# Patient Record
Sex: Female | Born: 1940
Health system: Southern US, Community
[De-identification: ages and names within clinical notes are randomized; demographics above are authoritative.]

## PROBLEM LIST (undated history)

## (undated) DIAGNOSIS — E785 Hyperlipidemia, unspecified: Secondary | ICD-10-CM

## (undated) DIAGNOSIS — C801 Malignant (primary) neoplasm, unspecified: Secondary | ICD-10-CM

## (undated) DIAGNOSIS — E039 Hypothyroidism, unspecified: Secondary | ICD-10-CM

## (undated) DIAGNOSIS — G20A1 Parkinson's disease without dyskinesia, without mention of fluctuations: Secondary | ICD-10-CM

## (undated) DIAGNOSIS — Z950 Presence of cardiac pacemaker: Secondary | ICD-10-CM

## (undated) DIAGNOSIS — R51 Headache: Secondary | ICD-10-CM

## (undated) DIAGNOSIS — E119 Type 2 diabetes mellitus without complications: Secondary | ICD-10-CM

## (undated) DIAGNOSIS — M4800 Spinal stenosis, site unspecified: Secondary | ICD-10-CM

## (undated) DIAGNOSIS — I219 Acute myocardial infarction, unspecified: Secondary | ICD-10-CM

## (undated) DIAGNOSIS — M5090 Cervical disc disorder, unspecified, unspecified cervical region: Secondary | ICD-10-CM

## (undated) DIAGNOSIS — F329 Major depressive disorder, single episode, unspecified: Secondary | ICD-10-CM

## (undated) DIAGNOSIS — G20 Parkinson's disease: Secondary | ICD-10-CM

## (undated) DIAGNOSIS — K219 Gastro-esophageal reflux disease without esophagitis: Secondary | ICD-10-CM

## (undated) DIAGNOSIS — M542 Cervicalgia: Secondary | ICD-10-CM

## (undated) DIAGNOSIS — I2510 Atherosclerotic heart disease of native coronary artery without angina pectoris: Secondary | ICD-10-CM

## (undated) DIAGNOSIS — F419 Anxiety disorder, unspecified: Secondary | ICD-10-CM

## (undated) DIAGNOSIS — M1990 Unspecified osteoarthritis, unspecified site: Secondary | ICD-10-CM

## (undated) DIAGNOSIS — F32A Depression, unspecified: Secondary | ICD-10-CM

## (undated) DIAGNOSIS — R519 Headache, unspecified: Secondary | ICD-10-CM

## (undated) DIAGNOSIS — I10 Essential (primary) hypertension: Secondary | ICD-10-CM

## (undated) DIAGNOSIS — S42309A Unspecified fracture of shaft of humerus, unspecified arm, initial encounter for closed fracture: Secondary | ICD-10-CM

## (undated) DIAGNOSIS — I509 Heart failure, unspecified: Secondary | ICD-10-CM

## (undated) HISTORY — DX: Anxiety disorder, unspecified: F41.9

## (undated) HISTORY — PX: OTHER SURGICAL HISTORY: SHX169

## (undated) HISTORY — PX: CORONARY ANGIOPLASTY: SHX604

## (undated) HISTORY — PX: CORONARY ARTERY BYPASS GRAFT: SHX141

## (undated) HISTORY — DX: Depression, unspecified: F32.A

## (undated) HISTORY — DX: Hypothyroidism, unspecified: E03.9

## (undated) HISTORY — PX: COLONOSCOPY: SHX174

## (undated) HISTORY — PX: INSERT / REPLACE / REMOVE PACEMAKER: SUR710

## (undated) HISTORY — PX: CORONARY STENT PLACEMENT: SHX1402

## (undated) HISTORY — DX: Essential (primary) hypertension: I10

## (undated) HISTORY — DX: Acute myocardial infarction, unspecified: I21.9

## (undated) HISTORY — DX: Major depressive disorder, single episode, unspecified: F32.9

## (undated) HISTORY — PX: ABDOMINAL HYSTERECTOMY: SHX81

## (undated) HISTORY — PX: BREAST SURGERY: SHX581

## (undated) HISTORY — DX: Cervicalgia: M54.2

## (undated) HISTORY — DX: Unspecified fracture of shaft of humerus, unspecified arm, initial encounter for closed fracture: S42.309A

## (undated) HISTORY — DX: Spinal stenosis, site unspecified: M48.00

## (undated) HISTORY — DX: Hyperlipidemia, unspecified: E78.5

---

## 2005-07-21 ENCOUNTER — Other Ambulatory Visit: Payer: Self-pay

## 2005-07-21 ENCOUNTER — Emergency Department: Payer: Self-pay | Admitting: Emergency Medicine

## 2006-06-06 ENCOUNTER — Emergency Department: Payer: Self-pay | Admitting: Emergency Medicine

## 2006-06-06 ENCOUNTER — Other Ambulatory Visit: Payer: Self-pay

## 2006-06-07 ENCOUNTER — Emergency Department: Payer: Self-pay | Admitting: Internal Medicine

## 2006-08-08 ENCOUNTER — Ambulatory Visit: Payer: Self-pay | Admitting: Ophthalmology

## 2006-08-15 ENCOUNTER — Ambulatory Visit: Payer: Self-pay | Admitting: Ophthalmology

## 2006-09-06 ENCOUNTER — Ambulatory Visit: Payer: Self-pay | Admitting: Ophthalmology

## 2006-09-13 ENCOUNTER — Ambulatory Visit: Payer: Self-pay | Admitting: Ophthalmology

## 2006-10-23 ENCOUNTER — Inpatient Hospital Stay: Payer: Self-pay | Admitting: Internal Medicine

## 2006-10-28 ENCOUNTER — Other Ambulatory Visit: Payer: Self-pay

## 2006-10-28 ENCOUNTER — Emergency Department: Payer: Self-pay | Admitting: Emergency Medicine

## 2006-11-22 ENCOUNTER — Ambulatory Visit: Payer: Self-pay | Admitting: Gastroenterology

## 2007-01-11 ENCOUNTER — Emergency Department: Payer: Self-pay | Admitting: Emergency Medicine

## 2007-03-08 ENCOUNTER — Ambulatory Visit: Payer: Self-pay | Admitting: Internal Medicine

## 2007-05-03 ENCOUNTER — Inpatient Hospital Stay: Payer: Self-pay | Admitting: Internal Medicine

## 2007-05-03 ENCOUNTER — Other Ambulatory Visit: Payer: Self-pay

## 2008-05-08 ENCOUNTER — Inpatient Hospital Stay: Payer: Self-pay | Admitting: Internal Medicine

## 2008-06-27 ENCOUNTER — Emergency Department: Payer: Self-pay | Admitting: Unknown Physician Specialty

## 2008-07-23 ENCOUNTER — Ambulatory Visit: Payer: Self-pay

## 2008-12-26 ENCOUNTER — Emergency Department: Payer: Self-pay | Admitting: Emergency Medicine

## 2009-03-30 ENCOUNTER — Inpatient Hospital Stay: Payer: Self-pay | Admitting: Unknown Physician Specialty

## 2009-04-27 ENCOUNTER — Ambulatory Visit: Payer: Self-pay | Admitting: Gastroenterology

## 2009-11-17 ENCOUNTER — Ambulatory Visit: Payer: Self-pay | Admitting: Pain Medicine

## 2010-01-11 ENCOUNTER — Ambulatory Visit: Payer: Self-pay | Admitting: Pain Medicine

## 2010-01-20 ENCOUNTER — Ambulatory Visit: Payer: Self-pay | Admitting: Pain Medicine

## 2010-02-13 ENCOUNTER — Inpatient Hospital Stay: Payer: Self-pay | Admitting: Unknown Physician Specialty

## 2010-02-25 ENCOUNTER — Ambulatory Visit: Payer: Self-pay | Admitting: Pain Medicine

## 2010-03-03 ENCOUNTER — Ambulatory Visit: Payer: Self-pay | Admitting: Pain Medicine

## 2010-03-31 ENCOUNTER — Ambulatory Visit: Payer: Self-pay | Admitting: Pain Medicine

## 2010-04-29 ENCOUNTER — Ambulatory Visit: Payer: Self-pay | Admitting: Pain Medicine

## 2010-05-10 ENCOUNTER — Ambulatory Visit: Payer: Self-pay | Admitting: Pain Medicine

## 2010-05-27 ENCOUNTER — Ambulatory Visit: Payer: Self-pay | Admitting: Pain Medicine

## 2010-06-21 ENCOUNTER — Ambulatory Visit: Payer: Self-pay | Admitting: Pain Medicine

## 2010-07-13 ENCOUNTER — Ambulatory Visit: Payer: Self-pay | Admitting: Pain Medicine

## 2010-07-19 ENCOUNTER — Ambulatory Visit: Payer: Self-pay | Admitting: Pain Medicine

## 2010-07-26 ENCOUNTER — Ambulatory Visit: Payer: Self-pay | Admitting: Pain Medicine

## 2010-08-24 ENCOUNTER — Ambulatory Visit: Payer: Self-pay | Admitting: Pain Medicine

## 2010-08-30 ENCOUNTER — Ambulatory Visit: Payer: Self-pay | Admitting: Pain Medicine

## 2010-09-16 ENCOUNTER — Ambulatory Visit: Payer: Self-pay | Admitting: Pain Medicine

## 2010-10-19 ENCOUNTER — Ambulatory Visit: Payer: Self-pay | Admitting: Pain Medicine

## 2010-10-25 ENCOUNTER — Ambulatory Visit: Payer: Self-pay | Admitting: Pain Medicine

## 2010-11-15 ENCOUNTER — Ambulatory Visit: Payer: Self-pay | Admitting: Pain Medicine

## 2010-11-24 ENCOUNTER — Ambulatory Visit: Payer: Self-pay | Admitting: Pain Medicine

## 2010-12-16 ENCOUNTER — Ambulatory Visit: Payer: Self-pay | Admitting: Pain Medicine

## 2010-12-20 ENCOUNTER — Ambulatory Visit: Payer: Self-pay | Admitting: Pain Medicine

## 2011-01-13 ENCOUNTER — Ambulatory Visit: Payer: Self-pay | Admitting: Pain Medicine

## 2011-01-24 ENCOUNTER — Ambulatory Visit: Payer: Self-pay | Admitting: Pain Medicine

## 2011-02-08 ENCOUNTER — Ambulatory Visit: Payer: Self-pay | Admitting: Pain Medicine

## 2011-02-16 ENCOUNTER — Ambulatory Visit: Payer: Self-pay | Admitting: Pain Medicine

## 2011-03-08 ENCOUNTER — Ambulatory Visit: Payer: Self-pay | Admitting: Pain Medicine

## 2011-03-23 ENCOUNTER — Ambulatory Visit: Payer: Self-pay | Admitting: Pain Medicine

## 2011-04-12 ENCOUNTER — Ambulatory Visit: Payer: Self-pay | Admitting: Pain Medicine

## 2011-04-18 ENCOUNTER — Ambulatory Visit: Payer: Self-pay | Admitting: Pain Medicine

## 2011-05-05 ENCOUNTER — Ambulatory Visit: Payer: Self-pay | Admitting: Pain Medicine

## 2011-06-07 ENCOUNTER — Ambulatory Visit: Payer: Self-pay | Admitting: Pain Medicine

## 2011-06-22 ENCOUNTER — Ambulatory Visit: Payer: Self-pay | Admitting: Pain Medicine

## 2011-07-12 ENCOUNTER — Ambulatory Visit: Payer: Self-pay | Admitting: Pain Medicine

## 2011-07-18 ENCOUNTER — Ambulatory Visit: Payer: Self-pay | Admitting: Pain Medicine

## 2011-08-09 ENCOUNTER — Ambulatory Visit: Payer: Self-pay | Admitting: Pain Medicine

## 2011-08-22 ENCOUNTER — Ambulatory Visit: Payer: Self-pay | Admitting: Pain Medicine

## 2011-09-08 ENCOUNTER — Ambulatory Visit: Payer: Self-pay | Admitting: Pain Medicine

## 2011-10-10 ENCOUNTER — Ambulatory Visit: Payer: Self-pay | Admitting: Pain Medicine

## 2011-11-08 ENCOUNTER — Ambulatory Visit: Payer: Self-pay | Admitting: Pain Medicine

## 2011-12-08 ENCOUNTER — Ambulatory Visit: Payer: Self-pay | Admitting: Pain Medicine

## 2012-01-09 ENCOUNTER — Ambulatory Visit: Payer: Self-pay | Admitting: Pain Medicine

## 2012-02-07 ENCOUNTER — Ambulatory Visit: Payer: Self-pay | Admitting: Pain Medicine

## 2012-03-08 ENCOUNTER — Ambulatory Visit: Payer: Self-pay | Admitting: Pain Medicine

## 2012-03-26 ENCOUNTER — Ambulatory Visit: Payer: Self-pay | Admitting: Pain Medicine

## 2012-04-10 ENCOUNTER — Ambulatory Visit: Payer: Self-pay | Admitting: Pain Medicine

## 2012-05-10 ENCOUNTER — Ambulatory Visit: Payer: Self-pay | Admitting: Pain Medicine

## 2012-06-04 ENCOUNTER — Ambulatory Visit: Payer: Self-pay | Admitting: Pain Medicine

## 2012-06-13 ENCOUNTER — Ambulatory Visit: Payer: Self-pay | Admitting: Pain Medicine

## 2012-07-05 ENCOUNTER — Ambulatory Visit: Payer: Self-pay | Admitting: Pain Medicine

## 2012-07-16 ENCOUNTER — Ambulatory Visit: Payer: Self-pay | Admitting: Pain Medicine

## 2012-08-07 ENCOUNTER — Ambulatory Visit: Payer: Self-pay | Admitting: Pain Medicine

## 2012-09-06 ENCOUNTER — Ambulatory Visit: Payer: Self-pay | Admitting: Pain Medicine

## 2012-10-01 ENCOUNTER — Ambulatory Visit: Payer: Self-pay | Admitting: Pain Medicine

## 2012-10-30 ENCOUNTER — Ambulatory Visit: Payer: Self-pay | Admitting: Internal Medicine

## 2012-10-31 ENCOUNTER — Ambulatory Visit: Payer: Self-pay | Admitting: Internal Medicine

## 2012-11-01 ENCOUNTER — Ambulatory Visit: Payer: Self-pay | Admitting: Pain Medicine

## 2012-11-14 ENCOUNTER — Ambulatory Visit: Payer: Self-pay | Admitting: Pain Medicine

## 2012-11-29 ENCOUNTER — Ambulatory Visit: Payer: Self-pay | Admitting: Pain Medicine

## 2012-12-17 ENCOUNTER — Ambulatory Visit: Payer: Self-pay | Admitting: Pain Medicine

## 2013-01-24 ENCOUNTER — Ambulatory Visit: Payer: Self-pay | Admitting: Pain Medicine

## 2013-02-28 ENCOUNTER — Ambulatory Visit: Payer: Self-pay | Admitting: Pain Medicine

## 2013-03-18 ENCOUNTER — Ambulatory Visit: Payer: Self-pay | Admitting: Pain Medicine

## 2013-03-27 ENCOUNTER — Ambulatory Visit: Payer: Self-pay | Admitting: Pain Medicine

## 2013-04-25 ENCOUNTER — Ambulatory Visit: Payer: Self-pay | Admitting: Pain Medicine

## 2013-05-13 ENCOUNTER — Ambulatory Visit: Payer: Self-pay | Admitting: Pain Medicine

## 2013-05-28 ENCOUNTER — Ambulatory Visit: Payer: Self-pay | Admitting: Internal Medicine

## 2013-05-28 ENCOUNTER — Ambulatory Visit: Payer: Self-pay | Admitting: Pain Medicine

## 2013-06-27 ENCOUNTER — Ambulatory Visit: Payer: Self-pay | Admitting: Pain Medicine

## 2013-07-10 ENCOUNTER — Ambulatory Visit: Payer: Self-pay | Admitting: Pain Medicine

## 2013-07-25 ENCOUNTER — Ambulatory Visit: Payer: Self-pay | Admitting: Pain Medicine

## 2013-08-22 DIAGNOSIS — F32A Depression, unspecified: Secondary | ICD-10-CM | POA: Insufficient documentation

## 2013-08-22 DIAGNOSIS — T402X5A Adverse effect of other opioids, initial encounter: Secondary | ICD-10-CM | POA: Insufficient documentation

## 2013-08-22 DIAGNOSIS — K219 Gastro-esophageal reflux disease without esophagitis: Secondary | ICD-10-CM | POA: Insufficient documentation

## 2013-08-22 DIAGNOSIS — F329 Major depressive disorder, single episode, unspecified: Secondary | ICD-10-CM | POA: Insufficient documentation

## 2013-08-22 DIAGNOSIS — I2510 Atherosclerotic heart disease of native coronary artery without angina pectoris: Secondary | ICD-10-CM | POA: Insufficient documentation

## 2013-08-22 DIAGNOSIS — F419 Anxiety disorder, unspecified: Secondary | ICD-10-CM

## 2013-08-22 DIAGNOSIS — K5903 Drug induced constipation: Secondary | ICD-10-CM | POA: Insufficient documentation

## 2013-08-27 ENCOUNTER — Ambulatory Visit: Payer: Self-pay | Admitting: Pain Medicine

## 2013-09-23 ENCOUNTER — Ambulatory Visit: Payer: Self-pay | Admitting: Gastroenterology

## 2013-09-26 ENCOUNTER — Ambulatory Visit: Payer: Self-pay | Admitting: Pain Medicine

## 2013-10-24 ENCOUNTER — Ambulatory Visit: Payer: Self-pay | Admitting: Pain Medicine

## 2013-10-28 ENCOUNTER — Ambulatory Visit: Payer: Self-pay | Admitting: Pain Medicine

## 2013-11-26 ENCOUNTER — Ambulatory Visit: Payer: Self-pay | Admitting: Pain Medicine

## 2013-12-04 ENCOUNTER — Ambulatory Visit: Payer: Self-pay | Admitting: Pain Medicine

## 2013-12-24 ENCOUNTER — Ambulatory Visit: Payer: Self-pay | Admitting: Pain Medicine

## 2013-12-30 ENCOUNTER — Ambulatory Visit: Payer: Self-pay | Admitting: Pain Medicine

## 2014-01-23 ENCOUNTER — Ambulatory Visit: Payer: Self-pay | Admitting: Pain Medicine

## 2014-02-19 ENCOUNTER — Emergency Department: Payer: Self-pay | Admitting: Emergency Medicine

## 2014-02-19 LAB — CBC
HCT: 37 % (ref 35.0–47.0)
HGB: 11.9 g/dL — ABNORMAL LOW (ref 12.0–16.0)
MCH: 31.1 pg (ref 26.0–34.0)
MCHC: 32.2 g/dL (ref 32.0–36.0)
MCV: 97 fL (ref 80–100)
Platelet: 163 10*3/uL (ref 150–440)
RBC: 3.83 10*6/uL (ref 3.80–5.20)
RDW: 14.3 % (ref 11.5–14.5)
WBC: 7.6 10*3/uL (ref 3.6–11.0)

## 2014-02-19 LAB — URINALYSIS, COMPLETE
Bacteria: NONE SEEN
Bilirubin,UR: NEGATIVE
Blood: NEGATIVE
Glucose,UR: NEGATIVE mg/dL (ref 0–75)
Hyaline Cast: 3
Ketone: NEGATIVE
Nitrite: NEGATIVE
Ph: 5 (ref 4.5–8.0)
Protein: NEGATIVE
RBC,UR: <1 /HPF (ref 0–5)
Specific Gravity: 1.003 (ref 1.003–1.030)
Squamous Epithelial: 1
WBC UR: 2 /HPF (ref 0–5)

## 2014-02-19 LAB — BASIC METABOLIC PANEL
Anion Gap: 9 (ref 7–16)
BUN: 7 mg/dL (ref 7–18)
Calcium, Total: 9.1 mg/dL (ref 8.5–10.1)
Chloride: 106 mmol/L (ref 98–107)
Co2: 28 mmol/L (ref 21–32)
Creatinine: 0.92 mg/dL (ref 0.60–1.30)
EGFR (African American): >60
EGFR (Non-African Amer.): >60
Glucose: 124 mg/dL — ABNORMAL HIGH (ref 65–99)
Osmolality: 284 (ref 275–301)
Potassium: 3.5 mmol/L (ref 3.5–5.1)
Sodium: 143 mmol/L (ref 136–145)

## 2014-02-25 ENCOUNTER — Ambulatory Visit: Payer: Self-pay | Admitting: Pain Medicine

## 2014-03-27 ENCOUNTER — Ambulatory Visit: Payer: Self-pay | Admitting: Pain Medicine

## 2014-04-07 ENCOUNTER — Ambulatory Visit: Payer: Self-pay | Admitting: Pain Medicine

## 2014-04-22 ENCOUNTER — Ambulatory Visit: Payer: Self-pay | Admitting: Pain Medicine

## 2014-05-17 ENCOUNTER — Emergency Department: Payer: Self-pay | Admitting: Internal Medicine

## 2014-05-20 ENCOUNTER — Emergency Department: Payer: Self-pay | Admitting: Emergency Medicine

## 2014-05-22 ENCOUNTER — Ambulatory Visit: Payer: Self-pay | Admitting: Pain Medicine

## 2014-06-04 DIAGNOSIS — M542 Cervicalgia: Secondary | ICD-10-CM | POA: Insufficient documentation

## 2014-06-04 DIAGNOSIS — R296 Repeated falls: Secondary | ICD-10-CM | POA: Insufficient documentation

## 2014-06-04 DIAGNOSIS — R413 Other amnesia: Secondary | ICD-10-CM | POA: Insufficient documentation

## 2014-06-04 HISTORY — DX: Cervicalgia: M54.2

## 2014-06-24 ENCOUNTER — Ambulatory Visit
Admit: 2014-06-24 | Disposition: A | Discharge status: Home / Self Care | Payer: Self-pay | Attending: Pain Medicine | Admitting: Pain Medicine

## 2014-07-02 ENCOUNTER — Ambulatory Visit
Admit: 2014-07-02 | Disposition: A | Discharge status: Home / Self Care | Payer: Self-pay | Attending: Pain Medicine | Admitting: Pain Medicine

## 2014-07-11 NOTE — Op Note (Signed)
PATIENT NAME:  Kristi [name], Kristi [name] MR#:  [id number] DATE OF BIRTH:  1940/12/30  DATE OF PROCEDURE:  10/31/2012  CLINICAL HISTORY: The patient has sick sinus syndrome with history of syncope. The patient has an end of battery life so pacemaker is being  replaced to replace the previous battery.   POSTOPERATIVE DIAGNOSIS: Sick sinus syndrome.   ATTENDING PHYSICIAN: Dr. Brunetta Genera.   IMPLANTING CARDIOLOGIST: Cletis Athens, MD.   ACCESSORY CLINICAL DATA: Pacemaker model: Adapta, ADDR01 serial number [id number].  PROCEDURE NOTE: The patient was prepared in a sterile fashion, after preparing the skin on the left upper chest with Hibiclens. Next, 1% Xylocaine was infiltrated under the skin and subcutaneous tissue and an incision was made on the skin overlying the previous pacemaker site,  an incision was made and subcutaneous tissues were carefully dissected and pacemaker was explanted. The leads were checked for any corrosion and they were found to be normal. Threshold on the ventricular lead was 0.5, current 2.3 mA, threshold at the atrial lead is 0.9 and mA 2.3 and threshold at the right ventricular lead 0.5 with 0.4 mA. Atrial lead impedance 430, ventricular lead impedance 782. Both pacemaker leads were connected to the pacemaker and all connections were tightened securely according to company specification. Cavity was checked for any bleeding points and fashioned to accommodate the new pacemaker.   Skin and subcutaneous tissues were sutured separately. The patient was sent to the PACU in stable condition.    ____________________________ Cletis Athens, MD jm:dp D: 10/31/2012 13:49:19 ET T: 10/31/2012 14:50:24 ET JOB#: [id number]  cc: Cletis Athens, MD, <Dictator> Cletis Athens MD ELECTRONICALLY SIGNED 12/14/2012 9:35

## 2014-07-21 ENCOUNTER — Telehealth: Payer: Self-pay

## 2014-07-21 NOTE — Telephone Encounter (Signed)
Called patient. States she is still having pain in her low back and right hip that radiates to her right leg. Wants to have a procedure. States last procedure was about 1 month ago. Please advise.

## 2014-07-21 NOTE — Telephone Encounter (Signed)
Pt is wanting to come in to see Dr. Primus Bravo she has not been able to walk to since her last procedure

## 2014-07-22 ENCOUNTER — Telehealth: Payer: Self-pay | Admitting: Pain Medicine

## 2014-07-22 NOTE — Telephone Encounter (Signed)
Prefer to schedule lesi near end of month

## 2014-07-22 NOTE — Telephone Encounter (Signed)
Nurses let me know the name of last procedure that I performed and have staff schedule procedure if ok with insurance.  Have Angie verify insurance ok with procedure.

## 2014-07-22 NOTE — Telephone Encounter (Signed)
07-22-14 0945 Patient had bilateral lumbar facets on 07-02-14. If approved by insurance, when would you like to schedule patient?

## 2014-07-22 NOTE — Telephone Encounter (Signed)
This encounter is being handled on the 07-21-2014 message. Patient informed that as soon as we are advised per Dr. Primus Bravo we will call her back.

## 2014-07-23 ENCOUNTER — Ambulatory Visit: Payer: Self-pay | Admitting: Pain Medicine

## 2014-07-23 NOTE — Telephone Encounter (Signed)
Kristi [name] If ok with patient insurance, please schedule patient for [facility] at the end of May and call patient with appt. Thanks.

## 2014-08-13 ENCOUNTER — Encounter: Payer: Self-pay | Admitting: Pain Medicine

## 2014-08-13 ENCOUNTER — Other Ambulatory Visit: Payer: Self-pay | Admitting: Pain Medicine

## 2014-08-13 ENCOUNTER — Ambulatory Visit: Payer: Medicare Other | Attending: Pain Medicine | Admitting: Pain Medicine

## 2014-08-13 VITALS — BP 128/74 | HR 60 | Temp 98.1°F | Resp 16 | Ht 66.0 in | Wt 145.0 lb

## 2014-08-13 DIAGNOSIS — M48062 Spinal stenosis, lumbar region with neurogenic claudication: Secondary | ICD-10-CM

## 2014-08-13 DIAGNOSIS — M1288 Other specific arthropathies, not elsewhere classified, other specified site: Secondary | ICD-10-CM | POA: Diagnosis not present

## 2014-08-13 DIAGNOSIS — M48061 Spinal stenosis, lumbar region without neurogenic claudication: Secondary | ICD-10-CM

## 2014-08-13 DIAGNOSIS — M5136 Other intervertebral disc degeneration, lumbar region: Secondary | ICD-10-CM

## 2014-08-13 DIAGNOSIS — M533 Sacrococcygeal disorders, not elsewhere classified: Secondary | ICD-10-CM

## 2014-08-13 DIAGNOSIS — M4806 Spinal stenosis, lumbar region: Secondary | ICD-10-CM | POA: Diagnosis not present

## 2014-08-13 DIAGNOSIS — M47816 Spondylosis without myelopathy or radiculopathy, lumbar region: Secondary | ICD-10-CM | POA: Insufficient documentation

## 2014-08-13 DIAGNOSIS — M5126 Other intervertebral disc displacement, lumbar region: Secondary | ICD-10-CM | POA: Diagnosis not present

## 2014-08-13 MED ORDER — LIDOCAINE HCL (PF) 1 % IJ SOLN
INTRAMUSCULAR | Status: AC
  Administered 2014-08-13: 11:00:00
  Filled 2014-08-13: qty 5

## 2014-08-13 MED ORDER — DOXYCYCLINE HYCLATE 100 MG PO TABS: 100.0000 mg | ORAL_TABLET | Freq: Two times a day (BID) | ORAL | Status: DC

## 2014-08-13 MED ORDER — MIDAZOLAM HCL 5 MG/5ML IJ SOLN
INTRAMUSCULAR | Status: AC
  Administered 2014-08-13: 3 mg via INTRAVENOUS
  Filled 2014-08-13: qty 5

## 2014-08-13 MED ORDER — TRIAMCINOLONE ACETONIDE 40 MG/ML IJ SUSP
INTRAMUSCULAR | Status: AC
  Administered 2014-08-13: 11:00:00
  Filled 2014-08-13: qty 1

## 2014-08-13 MED ORDER — FENTANYL CITRATE (PF) 100 MCG/2ML IJ SOLN
INTRAMUSCULAR | Status: AC
  Administered 2014-08-13: 50 ug via INTRAVENOUS
  Filled 2014-08-13: qty 2

## 2014-08-13 MED ORDER — SODIUM CHLORIDE 0.9 % IJ SOLN
INTRAMUSCULAR | Status: AC
  Administered 2014-08-13: 11:00:00
  Filled 2014-08-13: qty 20

## 2014-08-13 MED ORDER — BUPIVACAINE HCL (PF) 0.25 % IJ SOLN
INTRAMUSCULAR | Status: AC
  Administered 2014-08-13: 11:00:00
  Filled 2014-08-13: qty 30

## 2014-08-13 MED ORDER — ORPHENADRINE CITRATE 30 MG/ML IJ SOLN
INTRAMUSCULAR | Status: AC
  Administered 2014-08-13: 11:00:00
  Filled 2014-08-13: qty 2

## 2014-08-13 MED ORDER — CIPROFLOXACIN IN D5W 400 MG/200ML IV SOLN
INTRAVENOUS | Status: AC
  Administered 2014-08-13: 11:00:00 via INTRAVENOUS
  Filled 2014-08-13: qty 200

## 2014-08-13 MED ORDER — CIPROFLOXACIN HCL 250 MG PO TABS: 250.0000 mg | ORAL_TABLET | Freq: Two times a day (BID) | ORAL | Status: DC

## 2014-08-13 NOTE — Progress Notes (Signed)
Discharged to home via wheelchair with script for Cipro at 1112.  Teach back 3 done

## 2014-08-13 NOTE — Progress Notes (Signed)
   Subjective:    Patient ID: Kristi [name], female    DOB: Jul 25, 1940, 75 y.o.   MRN: [id number]  HPI  PROCEDURE PERFORMED: Lumbar epidural steroid injection   NOTE: The patient is a 74 y.o. female who returns to [place] for further evaluation and treatment of pain involving the lumbar and lower extremity region. 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 We will proceed with interventional treatment as discussed and explained to the patient who is willing to proceed with procedure as planned.   DESCRIPTION OF PROCEDURE: Lumbar epidural steroid injection with IV Versed, IV fentanyl conscious sedation, EKG, blood pressure, pulse, and pulse oximetry monitoring. The procedure was performed with the patient in the prone position under fluoroscopic guidance. A local anesthetic skin wheal of 1.5% plain lidocaine was accomplished at proposed entry site. An 18-gauge Tuohy epidural needle was inserted at the L 4 vertebral body level right of the midline via loss-of-resistance technique with negative heme and negative CSF return. A total of 4 mL of Preservative-Free normal saline with 40 mg of Kenalog injected incrementally via epidurally placed needle. Needle removed. The patient tolerated the injection well.   PLAN:   1. Medications: We will continue presently prescribed medications. 2. Will consider modification of treatment regimen pending response to treatment rendered on today's visit and follow-up evaluation. 3. The patient is to follow-up with primary care physician regarding blood pressure and general medical condition status  post lumbar epidural steroid injection performed on today's visit. 4. Surgical evaluation. 5. Neurological evaluation. 6. The patient may be a candidate for radiofrequency procedures, implantation device, and other treatment pending response to treatment and follow-up evaluation. 7. The patient has been advised to adhere to proper body mechanics and avoid activities which appear to aggravate condition. 8. The patient has been advised to call the Pain Management Center prior to scheduled return appointment should there be significant change in condition or should there be significant  1. Medications: We will continue presently prescribed medications.  2. Will consider modification of treatment regimen pending response to treatment rendered on today's visit and follow-up evaluation.  3. The patient is to follow-up with primary care physician regarding blood pressure and general medical condition status post lumbar epidural steroid injection performed on today's visit.  4. Surgical evaluation.  5. Neurological evaluation. 6. The patient may be a candidate for radiofrequency procedures, implantation device, and other treatment pending response to treatment and follow-up evaluation.  7. The patient has been advised to adhere to proper body mechanics and avoid activities which appear to aggravate condition.  8. The patient has been advised to call the Pain Management Center prior to scheduled return appointment should there be significant change in condition or should should patient have other concerns regarding condition prior to scheduled return appointment.  The patient is understanding and in agreement with suggested treatment plan.        Review of Systems     Objective:   Physical Exam        Assessment & Plan:

## 2014-08-13 NOTE — Patient Instructions (Addendum)
Continue present medications and antibioticcs  F/U PCP for evaliation of  BP and general medical  condition.  F/U surgical evaluation.  F/U nrurological evaluation.  May consider radiofrequency rhizolysis or intraspinal procedures pending response to present treatment and F/U evaluation.  Patient to call Pain Management Center should patient have concerns prior to scheduled return appointment.  Pain Management Discharge Instructions  General Discharge Instructions :  If you need to reach your doctor call: Monday-Friday 8:00 am - 4:00 pm at [phone] or toll free [phone].  After clinic hours [phone] to have operator reach doctor.  Bring all of your medication bottles to all your appointments in the pain clinic.  To cancel or reschedule your appointment with Pain Management please remember to call 24 hours in advance to avoid a fee.  Refer to the educational materials which you have been given on: General Risks, I had my Procedure. Discharge Instructions, Post Sedation.  Post Procedure Instructions:  The drugs you were given will stay in your system until tomorrow, so for the next 24 hours you should not drive, make any legal decisions or drink any alcoholic beverages.  You may eat anything you prefer, but it is better to start with liquids then soups and crackers, and gradually work up to solid foods.  Please notify your doctor immediately if you have any unusual bleeding, trouble breathing or pain that is not related to your normal pain.  Depending on the type of procedure that was done, some parts of your body may feel week and/or numb.  This usually clears up by tonight or the next day.  Walk with the use of an assistive device or accompanied by an adult for the 24 hours.  You may use ice on the affected area for the first 24 hours.  Put ice in a Ziploc bag and cover with a towel and place against area 15 minutes on 15 minutes off.  You may switch to heat after 24  hours.Epidural Steroid Injection Patient Information  Description: The epidural space surrounds the nerves as they exit the spinal cord.  In some patients, the nerves can be compressed and inflamed by a bulging disc or a tight spinal canal (spinal stenosis).  By injecting steroids into the epidural space, we can bring irritated nerves into direct contact with a potentially helpful medication.  These steroids act directly on the irritated nerves and can reduce swelling and inflammation which often leads to decreased pain.  Epidural steroids may be injected anywhere along the spine and from the neck to the low back depending upon the location of your pain.   After numbing the skin with local anesthetic (like Novocaine), a small needle is passed into the epidural space slowly.  You may experience a sensation of pressure while this is being done.  The entire block usually last less than 10 minutes.  Conditions which may be treated by epidural steroids:   Low back and leg pain  Neck and arm pain  Spinal stenosis  Post-laminectomy syndrome  Herpes zoster (shingles) pain  Pain from compression fractures  Preparation for the injection:  1. Do not eat any solid food or dairy products within 6 hours of your appointment.  2. You may drink clear liquids up to 2 hours before appointment.  Clear liquids include water, black coffee, juice or soda.  No milk or cream please. 3. You may take your regular medication, including pain medications, with a sip of water before your appointment  Diabetics should hold  regular insulin (if taken separately) and take 1/2 normal NPH dos the morning of the procedure.  Carry some sugar containing items with you to your appointment. 4. A driver must accompany you and be prepared to drive you home after your procedure.  5. Bring all your current medications with your. 6. An IV may be inserted and sedation may be given at the discretion of the physician.   7. A blood  pressure cuff, EKG and other monitors will often be applied during the procedure.  Some patients may need to have extra oxygen administered for a short period. 8. You will be asked to provide medical information, including your allergies, prior to the procedure.  We must know immediately if you are taking blood thinners (like Coumadin/Warfarin)  Or if you are allergic to IV iodine contrast (dye). We must know if you could possible be pregnant.  Possible side-effects:  Bleeding from needle site  Infection (rare, may require surgery)  Nerve injury (rare)  Numbness & tingling (temporary)  Difficulty urinating (rare, temporary)  Spinal headache ( a headache worse with upright posture)  Light -headedness (temporary)  Pain at injection site (several days)  Decreased blood pressure (temporary)  Weakness in arm/leg (temporary)  Pressure sensation in back/neck (temporary)  Call if you experience:  Fever/chills associated with headache or increased back/neck pain.  Headache worsened by an upright position.  New onset weakness or numbness of an extremity below the injection site  Hives or difficulty breathing (go to the emergency room)  Inflammation or drainage at the infection site  Severe back/neck pain  Any new symptoms which are concerning to you  Please note:  Although the local anesthetic injected can often make your back or neck feel good for several hours after the injection, the pain will likely return.  It takes 3-7 days for steroids to work in the epidural space.  You may not notice any pain relief for at least that one week.  If effective, we will often do a series of three injections spaced 3-6 weeks apart to maximally decrease your pain.  After the initial series, we generally will wait several months before considering a repeat injection of the same type.  If you have any questions, please call [phone] [facility]

## 2014-08-13 NOTE — Progress Notes (Signed)
   Subjective:    Patient ID: Kristi [name], female    DOB: 04/05/1940, 74 y.o.   MRN: [id number]  HPI    Review of Systems     Objective:   Physical Exam        Assessment & Plan:

## 2014-08-14 ENCOUNTER — Telehealth: Payer: Self-pay | Admitting: *Deleted

## 2014-08-14 NOTE — Telephone Encounter (Signed)
Denies any problems/concerns post procedure. 

## 2014-08-18 ENCOUNTER — Other Ambulatory Visit: Payer: Self-pay | Admitting: Pain Medicine

## 2014-08-21 ENCOUNTER — Encounter: Payer: Self-pay | Admitting: Pain Medicine

## 2014-08-21 ENCOUNTER — Ambulatory Visit: Payer: Medicare Other | Attending: Pain Medicine | Admitting: Pain Medicine

## 2014-08-21 VITALS — BP 118/77 | HR 68 | Temp 97.5°F | Resp 16 | Ht 66.0 in | Wt 145.0 lb

## 2014-08-21 DIAGNOSIS — M533 Sacrococcygeal disorders, not elsewhere classified: Secondary | ICD-10-CM

## 2014-08-21 DIAGNOSIS — M4806 Spinal stenosis, lumbar region: Secondary | ICD-10-CM | POA: Insufficient documentation

## 2014-08-21 DIAGNOSIS — M79604 Pain in right leg: Secondary | ICD-10-CM | POA: Diagnosis present

## 2014-08-21 DIAGNOSIS — M5136 Other intervertebral disc degeneration, lumbar region: Secondary | ICD-10-CM

## 2014-08-21 DIAGNOSIS — M419 Scoliosis, unspecified: Secondary | ICD-10-CM | POA: Insufficient documentation

## 2014-08-21 DIAGNOSIS — I739 Peripheral vascular disease, unspecified: Secondary | ICD-10-CM | POA: Insufficient documentation

## 2014-08-21 DIAGNOSIS — M1288 Other specific arthropathies, not elsewhere classified, other specified site: Secondary | ICD-10-CM | POA: Insufficient documentation

## 2014-08-21 DIAGNOSIS — M47812 Spondylosis without myelopathy or radiculopathy, cervical region: Secondary | ICD-10-CM | POA: Insufficient documentation

## 2014-08-21 DIAGNOSIS — M5126 Other intervertebral disc displacement, lumbar region: Secondary | ICD-10-CM | POA: Insufficient documentation

## 2014-08-21 DIAGNOSIS — M79605 Pain in left leg: Secondary | ICD-10-CM | POA: Diagnosis present

## 2014-08-21 DIAGNOSIS — M48062 Spinal stenosis, lumbar region with neurogenic claudication: Secondary | ICD-10-CM

## 2014-08-21 DIAGNOSIS — M545 Low back pain: Secondary | ICD-10-CM | POA: Diagnosis present

## 2014-08-21 DIAGNOSIS — M8580 Other specified disorders of bone density and structure, unspecified site: Secondary | ICD-10-CM | POA: Insufficient documentation

## 2014-08-21 DIAGNOSIS — M48061 Spinal stenosis, lumbar region without neurogenic claudication: Secondary | ICD-10-CM

## 2014-08-21 DIAGNOSIS — M47816 Spondylosis without myelopathy or radiculopathy, lumbar region: Secondary | ICD-10-CM

## 2014-08-21 MED ORDER — OXYCODONE HCL 5 MG PO CAPS: ORAL_CAPSULE | ORAL | Status: DC

## 2014-08-21 NOTE — Patient Instructions (Addendum)
Continue present medications. You will need permission of your primary care physician to hold aspirin for 5 days prior to the procedure  Lumbar epidural steroid injection 09/08/2014  F/U PCP for evaliation of  BP and general medical  condition.  F/U surgical evaluation.  F/U neurological evaluation.  May consider radiofrequency rhizolysis or intraspinal procedures pending response to present treatment and F/U evaluation.  Patient to call [facility] should patient have concerns prior to scheduled return appointment. GENERAL RISKS AND COMPLICATIONS  What are the risk, side effects and possible complications? Generally speaking, most procedures are safe.  However, with any procedure there are risks, side effects, and the possibility of complications.  The risks and complications are dependent upon the sites that are lesioned, or the type of nerve block to be performed.  The closer the procedure is to the spine, the more serious the risks are.  Great care is taken when placing the radio frequency needles, block needles or lesioning probes, but sometimes complications can occur. 1. Infection: Any time there is an injection through the skin, there is a risk of infection.  This is why sterile conditions are used for these blocks.  There are four possible types of infection. 1. Localized skin infection. 2. Central Nervous System Infection-This can be in the form of Meningitis, which can be deadly. 3. Epidural Infections-This can be in the form of an epidural abscess, which can cause pressure inside of the spine, causing compression of the spinal cord with subsequent paralysis. This would require an emergency surgery to decompress, and there are no guarantees that the patient would recover from the paralysis. 4. Discitis-This is an infection of the intervertebral discs.  It occurs in about 1% of discography procedures.  It is difficult to treat and it may lead to surgery.        2. Pain: the  needles have to go through skin and soft tissues, will cause soreness.       3. Damage to internal structures:  The nerves to be lesioned may be near blood vessels or    other nerves which can be potentially damaged.       4. Bleeding: Bleeding is more common if the patient is taking blood thinners such as  aspirin, Coumadin, Ticiid, Plavix, etc., or if he/she have some genetic predisposition  such as hemophilia. Bleeding into the spinal canal can cause compression of the spinal  cord with subsequent paralysis.  This would require an emergency surgery to  decompress and there are no guarantees that the patient would recover from the  paralysis.       5. Pneumothorax:  Puncturing of a lung is a possibility, every time a needle is introduced in  the area of the chest or upper back.  Pneumothorax refers to free air around the  collapsed lung(s), inside of the thoracic cavity (chest cavity).  Another two possible  complications related to a similar event would include: Hemothorax and Chylothorax.   These are variations of the Pneumothorax, where instead of air around the collapsed  lung(s), you may have blood or chyle, respectively.       6. Spinal headaches: They may occur with any procedures in the area of the spine.       7. Persistent CSF (Cerebro-Spinal Fluid) leakage: This is a rare problem, but may occur  with prolonged intrathecal or epidural catheters either due to the formation of a fistulous  track or a dural tear.  8. Nerve damage: By working so close to the spinal cord, there is always a possibility of  nerve damage, which could be as serious as a permanent spinal cord injury with  paralysis.       9. Death:  Although rare, severe deadly allergic reactions known as "Anaphylactic  reaction" can occur to any of the medications used.      10. Worsening of the symptoms:  We can always make thing worse.  What are the chances of something like this happening? Chances of any of this occuring are  extremely low.  By statistics, you have more of a chance of getting killed in a motor vehicle accident: while driving to the hospital than any of the above occurring .  Nevertheless, you should be aware that they are possibilities.  In general, it is similar to taking a shower.  Everybody knows that you can slip, hit your head and get killed.  Does that mean that you should not shower again?  Nevertheless always keep in mind that statistics do not mean anything if you happen to be on the wrong side of them.  Even if a procedure has a 1 (one) in a 1,000,000 (million) chance of going wrong, it you happen to be that one..Also, keep in mind that by statistics, you have more of a chance of having something go wrong when taking medications.  Who should not have this procedure? If you are on a blood thinning medication (e.g. Coumadin, Plavix, see list of "Blood Thinners"), or if you have an active infection going on, you should not have the procedure.  If you are taking any blood thinners, please inform your physician.  How should I prepare for this procedure?  Do not eat or drink anything at least six hours prior to the procedure.  Bring a driver with you .  It cannot be a taxi.  Come accompanied by an adult that can drive you back, and that is strong enough to help you if your legs get weak or numb from the local anesthetic.  Take all of your medicines the morning of the procedure with just enough water to swallow them.  If you have diabetes, make sure that you are scheduled to have your procedure done first thing in the morning, whenever possible.  If you have diabetes, take only half of your insulin dose and notify our nurse that you have done so as soon as you arrive at the clinic.  If you are diabetic, but only take blood sugar pills (oral hypoglycemic), then do not take them on the morning of your procedure.  You may take them after you have had the procedure.  Do not take aspirin or any  aspirin-containing medications, at least eleven (11) days prior to the procedure.  They may prolong bleeding.  Wear loose fitting clothing that may be easy to take off and that you would not mind if it got stained with Betadine or blood.  Do not wear any jewelry or perfume  Remove any nail coloring.  It will interfere with some of our monitoring equipment.  NOTE: Remember that this is not meant to be interpreted as a complete list of all possible complications.  Unforeseen problems may occur.  BLOOD THINNERS The following drugs contain aspirin or other products, which can cause increased bleeding during surgery and should not be taken for 2 weeks prior to and 1 week after surgery.  If you should need take something for relief of minor  pain, you may take acetaminophen which is found in Tylenol,m Datril, Anacin-3 and Panadol. It is not blood thinner. The products listed below are.  Do not take any of the products listed below in addition to any listed on your instruction sheet.  A.P.C or A.P.C with Codeine Codeine Phosphate Capsules #3 Ibuprofen Ridaura  ABC compound Congesprin Imuran rimadil  Advil Cope Indocin Robaxisal  Alka-Seltzer Effervescent Pain Reliever and Antacid Coricidin or Coricidin-D  Indomethacin Rufen  Alka-Seltzer plus Cold Medicine Cosprin Ketoprofen S-A-C Tablets  Anacin Analgesic Tablets or Capsules Coumadin Korlgesic Salflex  Anacin Extra Strength Analgesic tablets or capsules CP-2 Tablets Lanoril Salicylate  Anaprox Cuprimine Capsules Levenox Salocol  Anexsia-D Dalteparin Magan Salsalate  Anodynos Darvon compound Magnesium Salicylate Sine-off  Ansaid Dasin Capsules Magsal Sodium Salicylate  Anturane Depen Capsules Marnal Soma  APF Arthritis pain formula Dewitt's Pills Measurin Stanback  Argesic Dia-Gesic Meclofenamic Sulfinpyrazone  Arthritis Bayer Timed Release Aspirin Diclofenac Meclomen Sulindac  Arthritis pain formula Anacin Dicumarol Medipren Supac  Analgesic  (Safety coated) Arthralgen Diffunasal Mefanamic Suprofen  Arthritis Strength Bufferin Dihydrocodeine Mepro Compound Suprol  Arthropan liquid Dopirydamole Methcarbomol with Aspirin Synalgos  ASA tablets/Enseals Disalcid Micrainin Tagament  Ascriptin Doan's Midol Talwin  Ascriptin A/D Dolene Mobidin Tanderil  Ascriptin Extra Strength Dolobid Moblgesic Ticlid  Ascriptin with Codeine Doloprin or Doloprin with Codeine Momentum Tolectin  Asperbuf Duoprin Mono-gesic Trendar  Aspergum Duradyne Motrin or Motrin IB Triminicin  Aspirin plain, buffered or enteric coated Durasal Myochrisine Trigesic  Aspirin Suppositories Easprin Nalfon Trillsate  Aspirin with Codeine Ecotrin Regular or Extra Strength Naprosyn Uracel  Atromid-S Efficin Naproxen Ursinus  Auranofin Capsules Elmiron Neocylate Vanquish  Axotal Emagrin Norgesic Verin  Azathioprine Empirin or Empirin with Codeine Normiflo Vitamin E  Azolid Emprazil Nuprin Voltaren  Bayer Aspirin plain, buffered or children's or timed BC Tablets or powders Encaprin Orgaran Warfarin Sodium  Buff-a-Comp Enoxaparin Orudis Zorpin  Buff-a-Comp with Codeine Equegesic Os-Cal-Gesic   Buffaprin Excedrin plain, buffered or Extra Strength Oxalid   Bufferin Arthritis Strength Feldene Oxphenbutazone   Bufferin plain or Extra Strength Feldene Capsules Oxycodone with Aspirin   Bufferin with Codeine Fenoprofen Fenoprofen Pabalate or Pabalate-SF   Buffets II Flogesic Panagesic   Buffinol plain or Extra Strength Florinal or Florinal with Codeine Panwarfarin   Buf-Tabs Flurbiprofen Penicillamine   Butalbital Compound Four-way cold tablets Penicillin   Butazolidin Fragmin Pepto-Bismol   Carbenicillin Geminisyn Percodan   Carna Arthritis Reliever Geopen Persantine   Carprofen Gold's salt Persistin   Chloramphenicol Goody's Phenylbutazone   Chloromycetin Haltrain Piroxlcam   Clmetidine heparin Plaquenil   Cllnoril Hyco-pap Ponstel   Clofibrate Hydroxy chloroquine  Propoxyphen         Before stopping any of these medications, be sure to consult the physician who ordered them.  Some, such as Coumadin (Warfarin) are ordered to prevent or treat serious conditions such as "deep thrombosis", "pumonary embolisms", and other heart problems.  The amount of time that you may need off of the medication may also vary with the medication and the reason for which you were taking it.  If you are taking any of these medications, please make sure you notify your pain physician before you undergo any procedures.         Epidural Steroid Injection Patient Information  Description: The epidural space surrounds the nerves as they exit the spinal cord.  In some patients, the nerves can be compressed and inflamed by a bulging disc or a tight spinal canal (spinal stenosis).  By injecting  steroids into the epidural space, we can bring irritated nerves into direct contact with a potentially helpful medication.  These steroids act directly on the irritated nerves and can reduce swelling and inflammation which often leads to decreased pain.  Epidural steroids may be injected anywhere along the spine and from the neck to the low back depending upon the location of your pain.   After numbing the skin with local anesthetic (like Novocaine), a small needle is passed into the epidural space slowly.  You may experience a sensation of pressure while this is being done.  The entire block usually last less than 10 minutes.  Conditions which may be treated by epidural steroids:   Low back and leg pain  Neck and arm pain  Spinal stenosis  Post-laminectomy syndrome  Herpes zoster (shingles) pain  Pain from compression fractures  Preparation for the injection:  1. Do not eat any solid food or dairy products within 6 hours of your appointment.  2. You may drink clear liquids up to 2 hours before appointment.  Clear liquids include water, black coffee, juice or soda.  No milk or  cream please. 3. You may take your regular medication, including pain medications, with a sip of water before your appointment  Diabetics should hold regular insulin (if taken separately) and take 1/2 normal NPH dos the morning of the procedure.  Carry some sugar containing items with you to your appointment. 4. A driver must accompany you and be prepared to drive you home after your procedure.  5. Bring all your current medications with your. 6. An IV may be inserted and sedation may be given at the discretion of the physician.   7. A blood pressure cuff, EKG and other monitors will often be applied during the procedure.  Some patients may need to have extra oxygen administered for a short period. 8. You will be asked to provide medical information, including your allergies, prior to the procedure.  We must know immediately if you are taking blood thinners (like Coumadin/Warfarin)  Or if you are allergic to IV iodine contrast (dye). We must know if you could possible be pregnant.  Possible side-effects:  Bleeding from needle site  Infection (rare, may require surgery)  Nerve injury (rare)  Numbness & tingling (temporary)  Difficulty urinating (rare, temporary)  Spinal headache ( a headache worse with upright posture)  Light -headedness (temporary)  Pain at injection site (several days)  Decreased blood pressure (temporary)  Weakness in arm/leg (temporary)  Pressure sensation in back/neck (temporary)  Call if you experience:  Fever/chills associated with headache or increased back/neck pain.  Headache worsened by an upright position.  New onset weakness or numbness of an extremity below the injection site  Hives or difficulty breathing (go to the emergency room)  Inflammation or drainage at the infection site  Severe back/neck pain  Any new symptoms which are concerning to you  Please note:  Although the local anesthetic injected can often make your back or neck feel  good for several hours after the injection, the pain will likely return.  It takes 3-7 days for steroids to work in the epidural space.  You may not notice any pain relief for at least that one week.  If effective, we will often do a series of three injections spaced 3-6 weeks apart to maximally decrease your pain.  After the initial series, we generally will wait several months before considering a repeat injection of the same type.  If you  have any questions, please call [phone] [place] patient. Advised patient of provider's approval for requested procedure, as well as any comments/instructions from provider.   Provided patient w/ verbal instructions concerning pre-, intra- and post-procedure preparation and instructions.  Patient verbalized understanding of the above.   }

## 2014-08-21 NOTE — Progress Notes (Signed)
   Subjective:    Patient ID: Kristi [name], female    DOB: 1940/11/23, 74 y.o.   MRN: [id number]  HPI  Patient is 74 year old female returns to [place] for further evaluation and treatment of pain involving the lower back and lower extremity region. Patient has significant improvement with prior lumbar epidural steroid injection. Patient states she still has some pain as well as weakness with prolonged standing and walking. Patient is in hopes of being able to undergo another lumbar epidural steroid injection in attempt to decrease severity of her symptoms even more. We discussed patient's condition and will proceed with lumbar epidural steroid injection at time return appointment pending medical clearance as discussed and explained to patient on today's visit. All are with understanding and agree with suggested treatment plan     Review of Systems     Objective:   Physical Exam  There was mild tenderness of the splenius capitis and occipitalis musculature regions and there was unremarkable Spurling's maneuver. Patient appeared to be with bilaterally equal grip strength. Tinel and Phalen's maneuver were without increased pain of significant degree. There was mild tenderness over the cervical and thoracic paraspinal musculature region and thoracic facet regions. Outpatient over the lumbar paraspinal musculature region was associated with increased pain of mild to moderate degree. Lateral bending and rotation was associated with mild to moderate discomfort. Palpation of the PSIS and PII S regions reproduced moderate discomfort. Straight leg raising was tolerates approximately 20 without increase of pain with dorsiflexion noted. REM was nontender and no costovertebral angle tenderness noted.    Assessment & Plan:   Degenerative disc disease lumbar spine Degenerative changes L2-L3 L3-L4 with disc bulging and L3-4 facet arthropathy, severe spinal canal stenosis L4-L5 disc bulging  and multilevel facet arthropathy, osteopenia, scoliosis, severe spinal canal stenosis L4-L5 disc bulging and arthropathy, retrolisthesis on the right L2-3 and L3-4  Lumbar facet syndrome  Spinal stenosis with neurogenic claudication  Sacroiliac joint dysfunction   Plan  Continue present medications.  Lumbar epidural steroid injection at time of return appointment  F/U PCP for evaliation of  BP and general medical  condition.  F/U surgical evaluation.  F/U neurological evaluation.  May consider radiofrequency rhizolysis or intraspinal procedures pending response to present treatment and F/U evaluation.  Patient to call [facility] should patient have concerns prior to scheduled return appointment.

## 2014-08-21 NOTE — Progress Notes (Signed)
Safety precautions to be maintained throughout the outpatient stay will include: orient to surroundings, keep bed in low position, maintain call bell within reach at all times, provide assistance with transfer out of bed and ambulation.  

## 2014-08-21 NOTE — Progress Notes (Signed)
Discharged to home ambulatory with script for oxycodone in hand.  Pre procedure instructions given with teach back 3 done.  Patient states she does not take aspirin .

## 2014-09-08 ENCOUNTER — Encounter: Payer: Self-pay | Admitting: Pain Medicine

## 2014-09-08 ENCOUNTER — Ambulatory Visit: Payer: Medicare Other | Attending: Pain Medicine | Admitting: Pain Medicine

## 2014-09-08 VITALS — BP 133/79 | HR 61 | Temp 98.1°F | Resp 16 | Ht 66.0 in | Wt 145.0 lb

## 2014-09-08 DIAGNOSIS — M419 Scoliosis, unspecified: Secondary | ICD-10-CM | POA: Insufficient documentation

## 2014-09-08 DIAGNOSIS — M48062 Spinal stenosis, lumbar region with neurogenic claudication: Secondary | ICD-10-CM

## 2014-09-08 DIAGNOSIS — M4806 Spinal stenosis, lumbar region: Secondary | ICD-10-CM | POA: Insufficient documentation

## 2014-09-08 DIAGNOSIS — M8580 Other specified disorders of bone density and structure, unspecified site: Secondary | ICD-10-CM | POA: Diagnosis not present

## 2014-09-08 DIAGNOSIS — M79604 Pain in right leg: Secondary | ICD-10-CM | POA: Diagnosis present

## 2014-09-08 DIAGNOSIS — M79605 Pain in left leg: Secondary | ICD-10-CM | POA: Diagnosis present

## 2014-09-08 DIAGNOSIS — M1288 Other specific arthropathies, not elsewhere classified, other specified site: Secondary | ICD-10-CM | POA: Insufficient documentation

## 2014-09-08 DIAGNOSIS — M545 Low back pain: Secondary | ICD-10-CM | POA: Diagnosis present

## 2014-09-08 DIAGNOSIS — M5136 Other intervertebral disc degeneration, lumbar region: Secondary | ICD-10-CM

## 2014-09-08 DIAGNOSIS — M533 Sacrococcygeal disorders, not elsewhere classified: Secondary | ICD-10-CM

## 2014-09-08 DIAGNOSIS — M48061 Spinal stenosis, lumbar region without neurogenic claudication: Secondary | ICD-10-CM

## 2014-09-08 DIAGNOSIS — M5126 Other intervertebral disc displacement, lumbar region: Secondary | ICD-10-CM | POA: Diagnosis not present

## 2014-09-08 DIAGNOSIS — M47816 Spondylosis without myelopathy or radiculopathy, lumbar region: Secondary | ICD-10-CM

## 2014-09-08 MED ORDER — BUPIVACAINE HCL (PF) 0.25 % IJ SOLN
INTRAMUSCULAR | Status: AC
  Filled 2014-09-08: qty 30

## 2014-09-08 MED ORDER — LIDOCAINE HCL (PF) 1 % IJ SOLN
INTRAMUSCULAR | Status: AC
  Administered 2014-09-08: 3 mL
  Filled 2014-09-08: qty 5

## 2014-09-08 MED ORDER — ORPHENADRINE CITRATE 30 MG/ML IJ SOLN
INTRAMUSCULAR | Status: AC
  Filled 2014-09-08: qty 2

## 2014-09-08 MED ORDER — SODIUM CHLORIDE 0.9 % IJ SOLN
INTRAMUSCULAR | Status: AC
  Administered 2014-09-08: 4 mL
  Filled 2014-09-08: qty 20

## 2014-09-08 MED ORDER — FENTANYL CITRATE (PF) 100 MCG/2ML IJ SOLN
INTRAMUSCULAR | Status: AC
  Administered 2014-09-08: 50 ug via INTRAVENOUS
  Filled 2014-09-08: qty 2

## 2014-09-08 MED ORDER — CIPROFLOXACIN HCL 250 MG PO TABS: 250.0000 mg | ORAL_TABLET | Freq: Two times a day (BID) | ORAL | Status: DC

## 2014-09-08 MED ORDER — MIDAZOLAM HCL 5 MG/5ML IJ SOLN
INTRAMUSCULAR | Status: AC
  Administered 2014-09-08: 2 mg via INTRAVENOUS
  Filled 2014-09-08: qty 5

## 2014-09-08 MED ORDER — TRIAMCINOLONE ACETONIDE 40 MG/ML IJ SUSP
INTRAMUSCULAR | Status: AC
  Administered 2014-09-08: 40 mg
  Filled 2014-09-08: qty 1

## 2014-09-08 MED ORDER — CIPROFLOXACIN IN D5W 400 MG/200ML IV SOLN
INTRAVENOUS | Status: AC
  Administered 2014-09-08: 400 mg via INTRAVENOUS
  Filled 2014-09-08: qty 200

## 2014-09-08 NOTE — Progress Notes (Signed)
Safety precautions to be maintained throughout the outpatient stay will include: orient to surroundings, keep bed in low position, maintain call bell within reach at all times, provide assistance with transfer out of bed and ambulation.  

## 2014-09-08 NOTE — Progress Notes (Signed)
   Subjective:    Patient ID: Kristi [name], female    DOB: [birth year], 74 y.o.   MRN: [id number]  HPI  PROCEDURE PERFORMED: Lumbar epidural steroid injection   NOTE: The patient is a 74 y.o. female who returns to [place] for further evaluation and treatment of pain involving the lumbar and lower extremity region. MRI revealed the patient to be with degenerative changes lumbar spine L2-3, L3-4, disc with disc bulging with L3-4 facet arthropathy, severe spinal canal stenosis L4-L5 disc bulging and multilevel facet arthropathy, osteopenia, scoliosis, severe spinal canal stenosis.. There was retrolisthesis on the right at L2-3, and L3-4. The risks, benefits, and expectations of the procedure have been discussed and explained to the patient who was understanding and in agreement with suggested treatment plan. We will proceed with interventional treatment as discussed and explained to the patient who is willing to proceed with procedure as planned.   DESCRIPTION OF PROCEDURE: Lumbar epidural steroid injection with IV Versed, IV fentanyl conscious sedation, EKG, blood pressure, pulse, and pulse oximetry monitoring. The procedure was performed with the patient in the prone position under fluoroscopic guidance. A local anesthetic skin wheal of 1.5% plain lidocaine was accomplished at proposed entry site. An 18-gauge Tuohy epidural needle was inserted at the L 4 vertebral body level right of the midline via loss-of-resistance technique with negative heme and negative CSF return. A total of 4 mL of Preservative-Free normal saline with 40 mg of Kenalog injected incrementally via epidurally placed needle. Needle removed.  The patient tolerated the injection well.   PLAN:   1. Medications: We will continue presently prescribed medications. 2. Will consider modification of treatment regimen pending response to treatment rendered on today's visit and follow-up evaluation. 3. The patient is to  follow-up with primary care physician regarding blood pressure and general medical condition status post lumbar epidural steroid injection performed on today's visit. 4. Surgical evaluation. 5. Neurological evaluation. 6. The patient may be a candidate for radiofrequency procedures, implantation device, and other treatment pending response to treatment and follow-up evaluation. 7. The patient has been advised to adhere to proper body mechanics and avoid activities which appear to aggravate condition. 8. The patient has been advised to call the Pain Management Center prior to scheduled return appointment should there be significant change in condition or should there be sign  The patient is understanding and agrees with the suggested  treatment plan      Review of Systems     Objective:   Physical Exam        Assessment & Plan:

## 2014-09-08 NOTE — Patient Instructions (Addendum)
Continue present medications and antibiotic. Please begin taking antibiotic today  F/U PCP as discussed for evaliation of  BP and general medical  condition.  F/U surgical evaluation.  F/U neurological evaluation.  May consider radiofrequency rhizolysis or intraspinal procedures pending response to present treatment and F/U evaluation.  Patient to call [facility] Center should patient have concerns prior to scheduled return appointment. Epidural Steroid Injection Patient Information  Description: The epidural space surrounds the nerves as they exit the spinal cord.  In some patients, the nerves can be compressed and inflamed by a bulging disc or a tight spinal canal (spinal stenosis).  By injecting steroids into the epidural space, we can bring irritated nerves into direct contact with a potentially helpful medication.  These steroids act directly on the irritated nerves and can reduce swelling and inflammation which often leads to decreased pain.  Epidural steroids may be injected anywhere along the spine and from the neck to the low back depending upon the location of your pain.   After numbing the skin with local anesthetic (like Novocaine), a small needle is passed into the epidural space slowly.  You may experience a sensation of pressure while this is being done.  The entire block usually last less than 10 minutes.  Conditions which may be treated by epidural steroids:   Low back and leg pain  Neck and arm pain  Spinal stenosis  Post-laminectomy syndrome  Herpes zoster (shingles) pain  Pain from compression fractures  Preparation for the injection:  1. Do not eat any solid food or dairy products within 6 hours of your appointment.  2. You may drink clear liquids up to 2 hours before appointment.  Clear liquids include water, black coffee, juice or soda.  No milk or cream please. 3. You may take your regular medication, including pain medications, with a sip of water  before your appointment  Diabetics should hold regular insulin (if taken separately) and take 1/2 normal NPH dos the morning of the procedure.  Carry some sugar containing items with you to your appointment. 4. A driver must accompany you and be prepared to drive you home after your procedure.  5. Bring all your current medications with your. 6. An IV may be inserted and sedation may be given at the discretion of the physician.   7. A blood pressure cuff, EKG and other monitors will often be applied during the procedure.  Some patients may need to have extra oxygen administered for a short period. 8. You will be asked to provide medical information, including your allergies, prior to the procedure.  We must know immediately if you are taking blood thinners (like Coumadin/Warfarin)  Or if you are allergic to IV iodine contrast (dye). We must know if you could possible be pregnant.  Possible side-effects:  Bleeding from needle site  Infection (rare, may require surgery)  Nerve injury (rare)  Numbness & tingling (temporary)  Difficulty urinating (rare, temporary)  Spinal headache ( a headache worse with upright posture)  Light -headedness (temporary)  Pain at injection site (several days)  Decreased blood pressure (temporary)  Weakness in arm/leg (temporary)  Pressure sensation in back/neck (temporary)  Call if you experience:  Fever/chills associated with headache or increased back/neck pain.  Headache worsened by an upright position.  New onset weakness or numbness of an extremity below the injection site  Hives or difficulty breathing (go to the emergency room)  Inflammation or drainage at the infection site  Severe back/neck pain  Any new symptoms which are concerning to you  Please note:  Although the local anesthetic injected can often make your back or neck feel good for several hours after the injection, the pain will likely return.  It takes 3-7 days for  steroids to work in the epidural space.  You may not notice any pain relief for at least that one week.  If effective, we will often do a series of three injections spaced 3-6 weeks apart to maximally decrease your pain.  After the initial series, we generally will wait several months before considering a repeat injection of the same type.  If you have any questions, please call [phone] [place]  What are the risk, side effects and possible complications? Generally speaking, most procedures are safe.  However, with any procedure there are risks, side effects, and the possibility of complications.  The risks and complications are dependent upon the sites that are lesioned, or the type of nerve block to be performed.  The closer the procedure is to the spine, the more serious the risks are.  Great care is taken when placing the radio frequency needles, block needles or lesioning probes, but sometimes complications can occur. 1. Infection: Any time there is an injection through the skin, there is a risk of infection.  This is why sterile conditions are used for these blocks.  There are four possible types of infection. 1. Localized skin infection. 2. Central Nervous System Infection-This can be in the form of Meningitis, which can be deadly. 3. Epidural Infections-This can be in the form of an epidural abscess, which can cause pressure inside of the spine, causing compression of the spinal cord with subsequent paralysis. This would require an emergency surgery to decompress, and there are no guarantees that the patient would recover from the paralysis. 4. Discitis-This is an infection of the intervertebral discs.  It occurs in about 1% of discography procedures.  It is difficult to treat and it may lead to surgery.        2. Pain: the needles have to go through skin and soft tissues, will cause soreness.       3. Damage to  internal structures:  The nerves to be lesioned may be near blood vessels or    other nerves which can be potentially damaged.       4. Bleeding: Bleeding is more common if the patient is taking blood thinners such as  aspirin, Coumadin, Ticiid, Plavix, etc., or if he/she have some genetic predisposition  such as hemophilia. Bleeding into the spinal canal can cause compression of the spinal  cord with subsequent paralysis.  This would require an emergency surgery to  decompress and there are no guarantees that the patient would recover from the  paralysis.       5. Pneumothorax:  Puncturing of a lung is a possibility, every time a needle is introduced in  the area of the chest or upper back.  Pneumothorax refers to free air around the  collapsed lung(s), inside of the thoracic cavity (chest cavity).  Another two possible  complications related to a similar event would include: Hemothorax and Chylothorax.   These are variations of the Pneumothorax, where instead of air around the collapsed  lung(s), you may have blood or chyle, respectively.       6. Spinal headaches: They may occur with any procedures in the area of the spine.  7. Persistent CSF (Cerebro-Spinal Fluid) leakage: This is a rare problem, but may occur  with prolonged intrathecal or epidural catheters either due to the formation of a fistulous  track or a dural tear.       8. Nerve damage: By working so close to the spinal cord, there is always a possibility of  nerve damage, which could be as serious as a permanent spinal cord injury with  paralysis.       9. Death:  Although rare, severe deadly allergic reactions known as "Anaphylactic  reaction" can occur to any of the medications used.      10. Worsening of the symptoms:  We can always make thing worse.  What are the chances of something like this happening? Chances of any of this occuring are extremely low.  By statistics, you have more of a chance of getting killed in a motor  vehicle accident: while driving to the hospital than any of the above occurring .  Nevertheless, you should be aware that they are possibilities.  In general, it is similar to taking a shower.  Everybody knows that you can slip, hit your head and get killed.  Does that mean that you should not shower again?  Nevertheless always keep in mind that statistics do not mean anything if you happen to be on the wrong side of them.  Even if a procedure has a 1 (one) in a 1,000,000 (million) chance of going wrong, it you happen to be that one..Also, keep in mind that by statistics, you have more of a chance of having something go wrong when taking medications.  Who should not have this procedure? If you are on a blood thinning medication (e.g. Coumadin, Plavix, see list of "Blood Thinners"), or if you have an active infection going on, you should not have the procedure.  If you are taking any blood thinners, please inform your physician.  How should I prepare for this procedure?  Do not eat or drink anything at least six hours prior to the procedure.  Bring a driver with you .  It cannot be a taxi.  Come accompanied by an adult that can drive you back, and that is strong enough to help you if your legs get weak or numb from the local anesthetic.  Take all of your medicines the morning of the procedure with just enough water to swallow them.  If you have diabetes, make sure that you are scheduled to have your procedure done first thing in the morning, whenever possible.  If you have diabetes, take only half of your insulin dose and notify our nurse that you have done so as soon as you arrive at the clinic.  If you are diabetic, but only take blood sugar pills (oral hypoglycemic), then do not take them on the morning of your procedure.  You may take them after you have had the procedure.  Do not take aspirin or any aspirin-containing medications, at least eleven (11) days prior to the procedure.  They may  prolong bleeding.  Wear loose fitting clothing that may be easy to take off and that you would not mind if it got stained with Betadine or blood.  Do not wear any jewelry or perfume  Remove any nail coloring.  It will interfere with some of our monitoring equipment.  NOTE: Remember that this is not meant to be interpreted as a complete list of all possible complications.  Unforeseen problems may occur.  BLOOD THINNERS  The following drugs contain aspirin or other products, which can cause increased bleeding during surgery and should not be taken for 2 weeks prior to and 1 week after surgery.  If you should need take something for relief of minor pain, you may take acetaminophen which is found in Tylenol,m Datril, Anacin-3 and Panadol. It is not blood thinner. The products listed below are.  Do not take any of the products listed below in addition to any listed on your instruction sheet.  A.P.C or A.P.C with Codeine Codeine Phosphate Capsules #3 Ibuprofen Ridaura  ABC compound Congesprin Imuran rimadil  Advil Cope Indocin Robaxisal  Alka-Seltzer Effervescent Pain Reliever and Antacid Coricidin or Coricidin-D  Indomethacin Rufen  Alka-Seltzer plus Cold Medicine Cosprin Ketoprofen S-A-C Tablets  Anacin Analgesic Tablets or Capsules Coumadin Korlgesic Salflex  Anacin Extra Strength Analgesic tablets or capsules CP-2 Tablets Lanoril Salicylate  Anaprox Cuprimine Capsules Levenox Salocol  Anexsia-D Dalteparin Magan Salsalate  Anodynos Darvon compound Magnesium Salicylate Sine-off  Ansaid Dasin Capsules Magsal Sodium Salicylate  Anturane Depen Capsules Marnal Soma  APF Arthritis pain formula Dewitt's Pills Measurin Stanback  Argesic Dia-Gesic Meclofenamic Sulfinpyrazone  Arthritis Bayer Timed Release Aspirin Diclofenac Meclomen Sulindac  Arthritis pain formula Anacin Dicumarol Medipren Supac  Analgesic (Safety coated) Arthralgen Diffunasal Mefanamic Suprofen  Arthritis Strength Bufferin  Dihydrocodeine Mepro Compound Suprol  Arthropan liquid Dopirydamole Methcarbomol with Aspirin Synalgos  ASA tablets/Enseals Disalcid Micrainin Tagament  Ascriptin Doan's Midol Talwin  Ascriptin A/D Dolene Mobidin Tanderil  Ascriptin Extra Strength Dolobid Moblgesic Ticlid  Ascriptin with Codeine Doloprin or Doloprin with Codeine Momentum Tolectin  Asperbuf Duoprin Mono-gesic Trendar  Aspergum Duradyne Motrin or Motrin IB Triminicin  Aspirin plain, buffered or enteric coated Durasal Myochrisine Trigesic  Aspirin Suppositories Easprin Nalfon Trillsate  Aspirin with Codeine Ecotrin Regular or Extra Strength Naprosyn Uracel  Atromid-S Efficin Naproxen Ursinus  Auranofin Capsules Elmiron Neocylate Vanquish  Axotal Emagrin Norgesic Verin  Azathioprine Empirin or Empirin with Codeine Normiflo Vitamin E  Azolid Emprazil Nuprin Voltaren  Bayer Aspirin plain, buffered or children's or timed BC Tablets or powders Encaprin Orgaran Warfarin Sodium  Buff-a-Comp Enoxaparin Orudis Zorpin  Buff-a-Comp with Codeine Equegesic Os-Cal-Gesic   Buffaprin Excedrin plain, buffered or Extra Strength Oxalid   Bufferin Arthritis Strength Feldene Oxphenbutazone   Bufferin plain or Extra Strength Feldene Capsules Oxycodone with Aspirin   Bufferin with Codeine Fenoprofen Fenoprofen Pabalate or Pabalate-SF   Buffets II Flogesic Panagesic   Buffinol plain or Extra Strength Florinal or Florinal with Codeine Panwarfarin   Buf-Tabs Flurbiprofen Penicillamine   Butalbital Compound Four-way cold tablets Penicillin   Butazolidin Fragmin Pepto-Bismol   Carbenicillin Geminisyn Percodan   Carna Arthritis Reliever Geopen Persantine   Carprofen Gold's salt Persistin   Chloramphenicol Goody's Phenylbutazone   Chloromycetin Haltrain Piroxlcam   Clmetidine heparin Plaquenil   Cllnoril Hyco-pap Ponstel   Clofibrate Hydroxy chloroquine Propoxyphen         Before stopping any of these medications, be sure to consult the  physician who ordered them.  Some, such as Coumadin (Warfarin) are ordered to prevent or treat serious conditions such as "deep thrombosis", "pumonary embolisms", and other heart problems.  The amount of time that you may need off of the medication may also vary with the medication and the reason for which you were taking it.  If you are taking any of these medications, please make sure you notify your pain physician before you undergo any procedures.         Pain Management Discharge  Instructions  General Discharge Instructions :  If you need to reach your doctor call: Monday-Friday 8:00 am - 4:00 pm at [phone] or toll free [phone].  After clinic hours [phone] to have operator reach doctor.  Bring all of your medication bottles to all your appointments in the pain clinic.  To cancel or reschedule your appointment with Pain Management please remember to call 24 hours in advance to avoid a fee.  Refer to the educational materials which you have been given on: General Risks, I had my Procedure. Discharge Instructions, Post Sedation.  Post Procedure Instructions:  The drugs you were given will stay in your system until tomorrow, so for the next 24 hours you should not drive, make any legal decisions or drink any alcoholic beverages.  You may eat anything you prefer, but it is better to start with liquids then soups and crackers, and gradually work up to solid foods.  Please notify your doctor immediately if you have any unusual bleeding, trouble breathing or pain that is not related to your normal pain.  Depending on the type of procedure that was done, some parts of your body may feel week and/or numb.  This usually clears up by tonight or the next day.  Walk with the use of an assistive device or accompanied by an adult for the 24 hours.  You may use ice on the affected area for the first 24 hours.  Put ice in a Ziploc bag and cover with a towel and place against area  15 minutes on 15 minutes off.  You may switch to heat after 24 hours. cipro prescription at pharmacy

## 2014-09-09 ENCOUNTER — Telehealth: Payer: Self-pay | Admitting: *Deleted

## 2014-09-09 NOTE — Telephone Encounter (Signed)
Patient denies any problems post procedure.  

## 2014-09-15 ENCOUNTER — Ambulatory Visit: Payer: Medicare Other | Attending: Pain Medicine | Admitting: Pain Medicine

## 2014-09-15 ENCOUNTER — Encounter: Payer: Self-pay | Admitting: Pain Medicine

## 2014-09-15 VITALS — BP 99/69 | HR 63 | Temp 97.6°F | Resp 16 | Ht 66.0 in | Wt 145.0 lb

## 2014-09-15 DIAGNOSIS — M545 Low back pain: Secondary | ICD-10-CM | POA: Diagnosis present

## 2014-09-15 DIAGNOSIS — M4806 Spinal stenosis, lumbar region: Secondary | ICD-10-CM | POA: Insufficient documentation

## 2014-09-15 DIAGNOSIS — M48061 Spinal stenosis, lumbar region without neurogenic claudication: Secondary | ICD-10-CM

## 2014-09-15 DIAGNOSIS — M4316 Spondylolisthesis, lumbar region: Secondary | ICD-10-CM | POA: Insufficient documentation

## 2014-09-15 DIAGNOSIS — I8393 Asymptomatic varicose veins of bilateral lower extremities: Secondary | ICD-10-CM | POA: Insufficient documentation

## 2014-09-15 DIAGNOSIS — M79605 Pain in left leg: Secondary | ICD-10-CM | POA: Diagnosis present

## 2014-09-15 DIAGNOSIS — M5136 Other intervertebral disc degeneration, lumbar region: Secondary | ICD-10-CM | POA: Diagnosis not present

## 2014-09-15 DIAGNOSIS — M79604 Pain in right leg: Secondary | ICD-10-CM | POA: Diagnosis present

## 2014-09-15 DIAGNOSIS — M533 Sacrococcygeal disorders, not elsewhere classified: Secondary | ICD-10-CM | POA: Diagnosis not present

## 2014-09-15 DIAGNOSIS — M47816 Spondylosis without myelopathy or radiculopathy, lumbar region: Secondary | ICD-10-CM

## 2014-09-15 DIAGNOSIS — M8580 Other specified disorders of bone density and structure, unspecified site: Secondary | ICD-10-CM | POA: Insufficient documentation

## 2014-09-15 DIAGNOSIS — M5126 Other intervertebral disc displacement, lumbar region: Secondary | ICD-10-CM | POA: Insufficient documentation

## 2014-09-15 DIAGNOSIS — M48062 Spinal stenosis, lumbar region with neurogenic claudication: Secondary | ICD-10-CM

## 2014-09-15 MED ORDER — OXYCODONE HCL 5 MG PO CAPS: ORAL_CAPSULE | ORAL | Status: DC

## 2014-09-15 NOTE — Patient Instructions (Addendum)
Continue present medications. Oxycodone  F/U PCP for evaliation of  BP and general medical  condition. Please discussed your varicose veins with Dr. Brunetta Genera as we discussed today to consider vascular evaluation of your varicose veins   F/U surgical evaluation.  F/U neurological evaluation.  May consider radiofrequency rhizolysis or intraspinal procedures pending response to present treatment and F/U evaluation.  Patient to call Pain Management Center should patient have concerns prior to scheduled return appointment. Pain Management Discharge Instructions  General Discharge Instructions :  If you need to reach your doctor call: Monday-Friday 8:00 am - 4:00 pm at [phone] or toll free [phone].  After clinic hours [phone] to have operator reach doctor.  Bring all of your medication bottles to all your appointments in the pain clinic.  To cancel or reschedule your appointment with Pain Management please remember to call 24 hours in advance to avoid a fee.  Refer to the educational materials which you have been given on: General Risks, I had my Procedure. Discharge Instructions, Post Sedation.  Post Procedure Instructions:  The drugs you were given will stay in your system until tomorrow, so for the next 24 hours you should not drive, make any legal decisions or drink any alcoholic beverages.  You may eat anything you prefer, but it is better to start with liquids then soups and crackers, and gradually work up to solid foods.  Please notify your doctor immediately if you have any unusual bleeding, trouble breathing or pain that is not related to your normal pain.  Depending on the type of procedure that was done, some parts of your body may feel week and/or numb.  This usually clears up by tonight or the next day.  Walk with the use of an assistive device or accompanied by an adult for the 24 hours.  You may use ice on the affected area for the first 24 hours.  Put ice  in a Ziploc bag and cover with a towel and place against area 15 minutes on 15 minutes off.  You may switch to heat after 24 hours.

## 2014-09-15 NOTE — Progress Notes (Signed)
   Subjective:    Patient ID: Kristi [name], female    DOB: [birth year], 74 y.o.   MRN: [id number]  HPI  Patient is 74 year old female returns to [facility] for further evaluation and treatment of pain involving the lower back and lower extremity regions. The patient is status post lumbar epidural steroid injection and states that she is doing fine at this time. Patient is able to stand longer and is able to walk without experiencing significant lower back and or lower extremity pain. We will continue medications oxycodone and we'll avoid interventional treatment at this time. We will discuss surgical evaluation which patient prefers to avoid. Patient is with prior surgical evaluation. Decision was made to avoid surgical intervention. We will continue to present treatment regimen and patient is to call Pain Management Center should there be change in condition prior to scheduled return appointment. The patient is understanding and agrees with suggested treatment plan.     Review of Systems     Objective:   Physical Exam  There was tenderness to palpation of the splenius capitis and occipitalis musculature region of minimal degree. Outpatient over the cervical facet cervical paraspinal musculature region and thoracic facet and thoracic paraspinal musculature regions reproduced mild discomfort. Patient was with slightly decreased grip strength and Tinel and Phalen's maneuver were without increase of pain of significant degree. As unremarkable Spurling's maneuver. There was minimal tenderness of the acromioclavicular and glenohumeral joint regions. Palpation of the thoracic facet region was without increase of pain of any significant degree. Palpation over the lumbar paraspinal muscular region lumbar facet region was a tends to palpation mild to moderate degree. Lateral bending was with increased pain of mild degree. There was tenderness over the PSIS and PII S region. Patient of the PSIS  and PII S regions reproduced mild to moderate discomfort. Straight leg raising was tolerated to approximately 20 without an increase of pain with dorsiflexion noted there was negative clonus negative Homans no sensory deficit of dermatomal distribution detected superficial varicosities of the lower extremities were noted Mammoth tenderness of the acromial clavicular glenohumeral joint region. There was negative clonus and negative Homans. Abdomen nontender and no costovertebral tenderness was noted.      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 Oxycodone   F/U PCP  Dr.Niemeyer   for evaliation of  BP and general medical  condition and to discuss vascular evaluation of lower extremity varicose veins   F/U surgical evaluation.  F/U neurological evaluation.  Vascular evaluation of varicose veins of the lower extremities. Patient will address vascular evaluation with Dr. Brunetta Genera  May consider radiofrequency rhizolysis or intraspinal procedures pending response to present treatment and F/U evaluation.  Patient to call Pain Management Center should patient have concerns prior to scheduled return appointment.

## 2014-09-15 NOTE — Progress Notes (Signed)
Safety precautions to be maintained throughout the outpatient stay will include: orient to surroundings, keep bed in low position, maintain call bell within reach at all times, provide assistance with transfer out of bed and ambulation.  1015 discharged to home ambulatory. Script given as ordered. Pateint to return in 1 month for eval/ med refill    Bethann Humble RN

## 2014-10-14 ENCOUNTER — Encounter: Payer: Self-pay | Admitting: Pain Medicine

## 2014-10-14 ENCOUNTER — Ambulatory Visit: Payer: Medicare Other | Attending: Pain Medicine | Admitting: Pain Medicine

## 2014-10-14 VITALS — BP 119/76 | HR 74 | Temp 98.2°F | Resp 16 | Ht 67.0 in | Wt 145.0 lb

## 2014-10-14 DIAGNOSIS — M5126 Other intervertebral disc displacement, lumbar region: Secondary | ICD-10-CM | POA: Diagnosis not present

## 2014-10-14 DIAGNOSIS — M48062 Spinal stenosis, lumbar region with neurogenic claudication: Secondary | ICD-10-CM

## 2014-10-14 DIAGNOSIS — M533 Sacrococcygeal disorders, not elsewhere classified: Secondary | ICD-10-CM | POA: Insufficient documentation

## 2014-10-14 DIAGNOSIS — M545 Low back pain: Secondary | ICD-10-CM | POA: Diagnosis present

## 2014-10-14 DIAGNOSIS — M1288 Other specific arthropathies, not elsewhere classified, other specified site: Secondary | ICD-10-CM | POA: Diagnosis not present

## 2014-10-14 DIAGNOSIS — M4806 Spinal stenosis, lumbar region: Secondary | ICD-10-CM | POA: Diagnosis not present

## 2014-10-14 DIAGNOSIS — R531 Weakness: Secondary | ICD-10-CM | POA: Diagnosis present

## 2014-10-14 DIAGNOSIS — M419 Scoliosis, unspecified: Secondary | ICD-10-CM | POA: Diagnosis not present

## 2014-10-14 DIAGNOSIS — M5136 Other intervertebral disc degeneration, lumbar region: Secondary | ICD-10-CM

## 2014-10-14 DIAGNOSIS — M8580 Other specified disorders of bone density and structure, unspecified site: Secondary | ICD-10-CM | POA: Diagnosis not present

## 2014-10-14 DIAGNOSIS — M48061 Spinal stenosis, lumbar region without neurogenic claudication: Secondary | ICD-10-CM

## 2014-10-14 DIAGNOSIS — M47816 Spondylosis without myelopathy or radiculopathy, lumbar region: Secondary | ICD-10-CM

## 2014-10-14 MED ORDER — OXYCODONE HCL 5 MG PO CAPS: ORAL_CAPSULE | ORAL | Status: DC

## 2014-10-14 NOTE — Progress Notes (Signed)
   Subjective:    Patient ID: Kristi [name], female    DOB: 08/28/1940, 74 y.o.   MRN: [id number]  HPI    The patient is 74 year old female who returns to [place] for further evaluation and treatment of pain involving the lower back lower extremity with weakness of the lower extremities. Patient is with pain well-controlled at this time as stated that the weakness has improved significantly as well. We will continue oxycodone and avoid interventional treatment at this time. Patient is to call [facility] should there be return of pain of any significant degree. The patient was understanding and agree with suggested treatment plan.    Review of Systems     Objective:   Physical Exam  There was mild tenderness of the splenius capitis and occipitalis musculature region. There was unremarkable Spurling's maneuver. Palpation of the cervical facet cervical paraspinal musculature region reproduced mild discomfort. Palpation over the thoracic facet thoracic paraspinal musculature region reproduced mild discomfort. Patient appeared to be with bilaterally equal grip strength. Tinel and Phalen's maneuver were without increase of pain of significant degree. Palpation over the lumbar paraspinal muscular region lumbar facet region associated with mild discomfort. Lateral bending and rotation and extension and palpation of the lumbar facets reproduce mild discomfort. Straight leg raising tolerated to 30 without increased pain dorsiflexion noted. Negative clonus negative Homans. Palpation of the PSIS and PII S region reproduced mild discomfort. EHL strength appeared to be slightly decreased. There was no negative clonus negative Homans. Abdomen was nontender and no costovertebral angle tenderness was noted.       Assessment & Plan:  Degenerative disc disease lumbar spine L2-3 and L3-4 disc bulge, L3-4 facet arthropathy, severe spinal canal stenosis, L4-5 disc bulging and multilevel  facet arthropathy, osteopenia, scoliosis, severe spinal stenosis, L4-L5 disc bulging and arthropathy, retrolisthesis on the right at L2-3 and L3-4  Lumbar stenosis with neurogenic claudication  Lumbar facet syndrome  Sacroiliac joint dysfunction      Plan  Continue present medication oxycodone  F/U PCP Dr.Niemeyer   for evaliation of  BP and general medical  condition.  F/U surgical evaluation as discussed  F/U neurological evaluation  May consider radiofrequency rhizolysis or intraspinal procedures pending response to present treatment and F/U evaluation.  Patient to call [facility] should patient have concerns prior to scheduled return appointment.

## 2014-10-14 NOTE — Progress Notes (Signed)
Safety precautions to be maintained throughout the outpatient stay will include: orient to surroundings, keep bed in low position, maintain call bell within reach at all times, provide assistance with transfer out of bed and ambulation.  

## 2014-10-14 NOTE — Patient Instructions (Signed)
Continue present medication oxycodone  F/U PCP Dr.Niemeyer for evaliation of  BP and general medical  condition.  F/U surgical evaluation  F/U neurological evaluation  May consider radiofrequency rhizolysis or intraspinal procedures pending response to present treatment and F/U evaluation.  Patient to call [facility] should patient have concerns prior to scheduled return appointment.

## 2014-10-23 ENCOUNTER — Other Ambulatory Visit: Payer: Self-pay | Admitting: Pain Medicine

## 2014-11-11 ENCOUNTER — Ambulatory Visit: Payer: Medicare Other | Attending: Pain Medicine | Admitting: Pain Medicine

## 2014-11-11 ENCOUNTER — Encounter: Payer: Self-pay | Admitting: Pain Medicine

## 2014-11-11 VITALS — BP 121/78 | HR 56 | Temp 97.7°F | Resp 14 | Ht 67.0 in | Wt 145.0 lb

## 2014-11-11 DIAGNOSIS — M5136 Other intervertebral disc degeneration, lumbar region: Secondary | ICD-10-CM

## 2014-11-11 DIAGNOSIS — M419 Scoliosis, unspecified: Secondary | ICD-10-CM | POA: Diagnosis not present

## 2014-11-11 DIAGNOSIS — M533 Sacrococcygeal disorders, not elsewhere classified: Secondary | ICD-10-CM | POA: Diagnosis not present

## 2014-11-11 DIAGNOSIS — M1288 Other specific arthropathies, not elsewhere classified, other specified site: Secondary | ICD-10-CM | POA: Insufficient documentation

## 2014-11-11 DIAGNOSIS — M48061 Spinal stenosis, lumbar region without neurogenic claudication: Secondary | ICD-10-CM

## 2014-11-11 DIAGNOSIS — M4806 Spinal stenosis, lumbar region: Secondary | ICD-10-CM | POA: Diagnosis not present

## 2014-11-11 DIAGNOSIS — M8580 Other specified disorders of bone density and structure, unspecified site: Secondary | ICD-10-CM | POA: Diagnosis not present

## 2014-11-11 DIAGNOSIS — M47816 Spondylosis without myelopathy or radiculopathy, lumbar region: Secondary | ICD-10-CM

## 2014-11-11 DIAGNOSIS — M79604 Pain in right leg: Secondary | ICD-10-CM | POA: Diagnosis present

## 2014-11-11 DIAGNOSIS — M545 Low back pain: Secondary | ICD-10-CM | POA: Diagnosis present

## 2014-11-11 DIAGNOSIS — M79605 Pain in left leg: Secondary | ICD-10-CM | POA: Diagnosis present

## 2014-11-11 DIAGNOSIS — M4316 Spondylolisthesis, lumbar region: Secondary | ICD-10-CM | POA: Diagnosis not present

## 2014-11-11 DIAGNOSIS — M51369 Other intervertebral disc degeneration, lumbar region without mention of lumbar back pain or lower extremity pain: Secondary | ICD-10-CM

## 2014-11-11 DIAGNOSIS — M48062 Spinal stenosis, lumbar region with neurogenic claudication: Secondary | ICD-10-CM

## 2014-11-11 MED ORDER — OXYCODONE HCL 5 MG PO CAPS: ORAL_CAPSULE | ORAL | Status: DC

## 2014-11-11 NOTE — Progress Notes (Signed)
Safety precautions to be maintained throughout the outpatient stay will include: orient to surroundings, keep bed in low position, maintain call bell within reach at all times, provide assistance with transfer out of bed and ambulation.  

## 2014-11-11 NOTE — Progress Notes (Signed)
   Subjective:    Patient ID: Kristi [name], female    DOB: [birth year], 74 y.o.   MRN: [id number]  HPI  Patient is 74 year old female who returns to [place] for further evaluation and treatment of pain involving the lower back lower extremity region associated with weakness. Patient states that she had done well following prior interventional treatment performed in [facility] and notes some return of pressure of the lower back region associated with some lower extremity weakness that increases as patient spends more time on the feet. He discussed patient's overall condition and will have patient follow-up with her primary care physician Dr.Niemeyer and pending medical clearance we will proceed with lumbar epidural steroid injection to be performed at time return appointment. The patient was understanding and agree with suggested treatment plan. Patient will continue oxycodone as prescribed at this time and will call pain management Center prior to scheduled return appointment should there be significant change in patient's condition prior to scheduled return appointment.      Review of Systems     Objective:   Physical Exam  There was tenderness of the spinous And occipitalis musculature region a minimal degree. There was minimal tenderness of the acromioclavicular and glenohumeral joint regions. There was minimal tenderness of the cervical facet cervical paraspinal musculature region and minimal tenderness of the thoracic facet thoracic paraspinal musculature region. There was no crepitus of the thoracic region noted. Patient appeared to be with bilaterally equal grip strength. Tinel and Phalen's maneuver without increased pain of significant degree. Palpation of the thoracic facet thoracic paraspinal must region was with moderate muscle spasms of the lower thoracic paraspinal musculature region. Palpation over the lumbar paraspinal muscles region lumbar facet region was  with moderate tends to palpation with lateral bending and rotation reproducing moderate discomfort. There was moderate tends to palpation over the gluteal and piriformis musculature region and mild tenderness to palpation of the PSIS and PII S region. There was mild tends to palpation greater trochanteric region and iliotibial band region. Straight leg raising was tolerated to approximately 20 without a definite increase of pain with dorsiflexion noted. There was negative clonus negative Homans. No definite sensory deficit of dermatomal distribution was detected. Abdomen was nontender and no costovertebral tenderness was noted.      Assessment & Plan:    Degenerative disc disease lumbar spine Degenerative changes L2-L3 L3-L4 with disc bulging and L3-4 facet arthropathy, severe spinal canal stenosis L4-L5 disc bulging and multilevel facet arthropathy, osteopenia, scoliosis, severe spinal canal stenosis L4-L5 disc bulging and arthropathy, retrolisthesis on the right L2-3 and L3-4  Lumbar facet syndrome  Spinal stenosis with neurogenic claudication  Sacroiliac joint dysfunction    Plan   Continue present medication oxycodone  Lumbar epidural steroid injection to be performed at time return appointment pending medical clearance by patient's primary care physician  F/U PCP  Dr.Neimeyer for evaliation of  BP and general medical  condition and from medical clearance to perform lumbar epidural steroid injection at time of return appointment  F/U surgical evaluation. Patient prefers to avoid surgical evaluation or surgical intervention  F/U neurological evaluation May be considered as discussed May consider radiofrequency rhizolysis or intraspinal procedures pending response to present treatment and F/U evaluation   Patient to call [facility] should patient have concerns prior to scheduled return appointmen.

## 2014-11-11 NOTE — Patient Instructions (Addendum)
Continue present medication  Oxycodone  Lumbar epidural steroid injection to be performed at time of return appointment  F/U PCP   Dr.Niemeyer  for evaliation of  BP and general medical  condition  F/U surgical evaluation as needed  F/U neurological evaluation  May consider radiofrequency rhizolysis or intraspinal procedures pending response to present treatment and F/U evaluation   Patient to call [facility] should patient have concerns prior to scheduled return appointmen. Epidural Steroid Injection Patient Information  Description: The epidural space surrounds the nerves as they exit the spinal cord.  In some patients, the nerves can be compressed and inflamed by a bulging disc or a tight spinal canal (spinal stenosis).  By injecting steroids into the epidural space, we can bring irritated nerves into direct contact with a potentially helpful medication.  These steroids act directly on the irritated nerves and can reduce swelling and inflammation which often leads to decreased pain.  Epidural steroids may be injected anywhere along the spine and from the neck to the low back depending upon the location of your pain.   After numbing the skin with local anesthetic (like Novocaine), a small needle is passed into the epidural space slowly.  You may experience a sensation of pressure while this is being done.  The entire block usually last less than 10 minutes.  Conditions which may be treated by epidural steroids:   Low back and leg pain  Neck and arm pain  Spinal stenosis  Post-laminectomy syndrome  Herpes zoster (shingles) pain  Pain from compression fractures  Preparation for the injection:  1. Do not eat any solid food or dairy products within 6 hours of your appointment.  2. You may drink clear liquids up to 2 hours before appointment.  Clear liquids include water, black coffee, juice or soda.  No milk or cream please. 3. You may take your regular medication,  including pain medications, with a sip of water before your appointment  Diabetics should hold regular insulin (if taken separately) and take 1/2 normal NPH dos the morning of the procedure.  Carry some sugar containing items with you to your appointment. 4. A driver must accompany you and be prepared to drive you home after your procedure.  5. Bring all your current medications with your. 6. An IV may be inserted and sedation may be given at the discretion of the physician.   7. A blood pressure cuff, EKG and other monitors will often be applied during the procedure.  Some patients may need to have extra oxygen administered for a short period. 8. You will be asked to provide medical information, including your allergies, prior to the procedure.  We must know immediately if you are taking blood thinners (like Coumadin/Warfarin)  Or if you are allergic to IV iodine contrast (dye). We must know if you could possible be pregnant.  Possible side-effects:  Bleeding from needle site  Infection (rare, may require surgery)  Nerve injury (rare)  Numbness & tingling (temporary)  Difficulty urinating (rare, temporary)  Spinal headache ( a headache worse with upright posture)  Light -headedness (temporary)  Pain at injection site (several days)  Decreased blood pressure (temporary)  Weakness in arm/leg (temporary)  Pressure sensation in back/neck (temporary)  Call if you experience:  Fever/chills associated with headache or increased back/neck pain.  Headache worsened by an upright position.  New onset weakness or numbness of an extremity below the injection site  Hives or difficulty breathing (go to the emergency room)  Inflammation or drainage at the infection site  Severe back/neck pain  Any new symptoms which are concerning to you  Please note:  Although the local anesthetic injected can often make your back or neck feel good for several hours after the injection, the pain  will likely return.  It takes 3-7 days for steroids to work in the epidural space.  You may not notice any pain relief for at least that one week.  If effective, we will often do a series of three injections spaced 3-6 weeks apart to maximally decrease your pain.  After the initial series, we generally will wait several months before considering a repeat injection of the same type.  If you have any questions, please call [phone] [facility]

## 2014-11-25 ENCOUNTER — Telehealth: Payer: Self-pay | Admitting: Pain Medicine

## 2014-11-25 NOTE — Telephone Encounter (Signed)
Patient states she has already seen PCP, was given a "fluid pill". Does not take any anticoagulants.

## 2014-11-25 NOTE — Telephone Encounter (Signed)
Increased pain in legs wants to come for proc sooner than sched 12-10-14

## 2014-11-25 NOTE — Telephone Encounter (Signed)
Has pain in both legs, not relieved by meds. Scheduled for procedure on 9-21, wants to get it sooner. This is not a new pain.

## 2014-11-25 NOTE — Telephone Encounter (Signed)
Nurses and Juliann Pulse Have patient see Dr. Brunetta Genera R go to the emergency room to evaluate for blood clot, cellulitis or other conditions and schedule patient for her procedure to be performed on Monday, September 12 or Wednesday, September 14  Check for aspirin oand similar medications and discuss with me

## 2014-12-01 ENCOUNTER — Encounter: Payer: Self-pay | Admitting: Pain Medicine

## 2014-12-01 ENCOUNTER — Ambulatory Visit: Payer: Medicare Other | Attending: Pain Medicine | Admitting: Pain Medicine

## 2014-12-01 VITALS — BP 114/67 | HR 72 | Temp 98.2°F | Resp 16 | Ht 67.0 in | Wt 150.0 lb

## 2014-12-01 DIAGNOSIS — M4806 Spinal stenosis, lumbar region: Secondary | ICD-10-CM | POA: Diagnosis not present

## 2014-12-01 DIAGNOSIS — M51369 Other intervertebral disc degeneration, lumbar region without mention of lumbar back pain or lower extremity pain: Secondary | ICD-10-CM

## 2014-12-01 DIAGNOSIS — M4186 Other forms of scoliosis, lumbar region: Secondary | ICD-10-CM | POA: Diagnosis not present

## 2014-12-01 DIAGNOSIS — M5126 Other intervertebral disc displacement, lumbar region: Secondary | ICD-10-CM | POA: Insufficient documentation

## 2014-12-01 DIAGNOSIS — M48062 Spinal stenosis, lumbar region with neurogenic claudication: Secondary | ICD-10-CM

## 2014-12-01 DIAGNOSIS — M79605 Pain in left leg: Secondary | ICD-10-CM | POA: Diagnosis present

## 2014-12-01 DIAGNOSIS — M8580 Other specified disorders of bone density and structure, unspecified site: Secondary | ICD-10-CM | POA: Insufficient documentation

## 2014-12-01 DIAGNOSIS — M533 Sacrococcygeal disorders, not elsewhere classified: Secondary | ICD-10-CM

## 2014-12-01 DIAGNOSIS — M1288 Other specific arthropathies, not elsewhere classified, other specified site: Secondary | ICD-10-CM | POA: Diagnosis not present

## 2014-12-01 DIAGNOSIS — M5136 Other intervertebral disc degeneration, lumbar region: Secondary | ICD-10-CM | POA: Insufficient documentation

## 2014-12-01 DIAGNOSIS — M48061 Spinal stenosis, lumbar region without neurogenic claudication: Secondary | ICD-10-CM

## 2014-12-01 DIAGNOSIS — M47816 Spondylosis without myelopathy or radiculopathy, lumbar region: Secondary | ICD-10-CM

## 2014-12-01 DIAGNOSIS — M545 Low back pain: Secondary | ICD-10-CM | POA: Diagnosis present

## 2014-12-01 DIAGNOSIS — M79604 Pain in right leg: Secondary | ICD-10-CM | POA: Diagnosis present

## 2014-12-01 MED ORDER — FENTANYL CITRATE (PF) 100 MCG/2ML IJ SOLN
INTRAMUSCULAR | Status: AC
  Administered 2014-12-01: 50 ug via INTRAVENOUS
  Filled 2014-12-01: qty 2

## 2014-12-01 MED ORDER — BUPIVACAINE HCL (PF) 0.25 % IJ SOLN
INTRAMUSCULAR | Status: AC
  Administered 2014-12-01: 10:00:00
  Filled 2014-12-01: qty 30

## 2014-12-01 MED ORDER — CIPROFLOXACIN HCL 250 MG PO TABS: 250.0000 mg | ORAL_TABLET | Freq: Two times a day (BID) | ORAL | Status: DC

## 2014-12-01 MED ORDER — SODIUM CHLORIDE 0.9 % IJ SOLN
INTRAMUSCULAR | Status: AC
  Administered 2014-12-01: 10:00:00
  Filled 2014-12-01: qty 20

## 2014-12-01 MED ORDER — CIPROFLOXACIN IN D5W 400 MG/200ML IV SOLN
INTRAVENOUS | Status: AC
  Administered 2014-12-01: 400 mg via INTRAVENOUS
  Filled 2014-12-01: qty 200

## 2014-12-01 MED ORDER — ORPHENADRINE CITRATE 30 MG/ML IJ SOLN
INTRAMUSCULAR | Status: AC
  Filled 2014-12-01: qty 2

## 2014-12-01 MED ORDER — TRIAMCINOLONE ACETONIDE 40 MG/ML IJ SUSP
INTRAMUSCULAR | Status: AC
  Filled 2014-12-01: qty 1

## 2014-12-01 MED ORDER — MIDAZOLAM HCL 5 MG/5ML IJ SOLN
INTRAMUSCULAR | Status: AC
  Administered 2014-12-01: 2 mg via INTRAVENOUS
  Filled 2014-12-01: qty 5

## 2014-12-01 MED ORDER — LIDOCAINE HCL (PF) 1 % IJ SOLN
INTRAMUSCULAR | Status: AC
  Administered 2014-12-01: 10:00:00
  Filled 2014-12-01: qty 5

## 2014-12-01 NOTE — Patient Instructions (Signed)
PLAN  Continue present medication Cipro and begin taking antibiotic as prescribed. Please obtain your antibiotic Cipro today  and begin taking antibiotic today  F/U PCP Dr Brunetta Genera for evaliation of  BP and general medical  condition.  F/U surgical evaluation. May consider pending follow-up evaluations  F/U neurological evaluation. May consider pending follow-up evaluations  May consider radiofrequency rhizolysis or intraspinal procedures pending response to present treatment and F/U evaluation.  Patient to call [facility] should patient have concerns prior to scheduled return appointment.

## 2014-12-01 NOTE — Progress Notes (Signed)
   Subjective:    Patient ID: Kristi [name], female    DOB: 02/22/1941, 74 y.o.   MRN: [id number]  HPI  PROCEDURE PERFORMED: Lumbar epidural steroid injection   NOTE: The patient is a 74 y.o. female who returns to [place]  for further evaluation and treatment of pain involving the lumbar and lower extremity region. MRI revealed the patient to be with degenerative disc disease lumbar spine Degenerative changes L2-L3 L3-L4 with disc bulging and L3-4 facet arthropathy, severe spinal canal stenosis L4-L5 disc bulging and multilevel facet arthropathy, osteopenia, scoliosis, severe spinal canal stenosis L4-L5 disc bulging and arthropathy, retrolisthesis on the right L2-3 and L3-4. [name] There is concern regarding intraspinal abnormalities during the patient's symptomatology with what appears to be lumbar stenosis with neurogenic claudication The risks, benefits, and expectations of the procedure have been discussed and explained to the patient who was understanding and in agreement with suggested treatment plan. We will proceed with lumbar epidural steroid injection as discussed and as explained to the patient who is willing to proceed with procedure as planned.   DESCRIPTION OF PROCEDURE: Lumbar epidural steroid injection with IV Versed, IV fentanyl conscious sedation, EKG, blood pressure, pulse, and pulse oximetry monitoring. The procedure was performed with the patient in the prone position under fluoroscopic guidance. A local anesthetic skin wheal of 1.5% plain lidocaine was accomplished at proposed entry site. An 18-gauge Tuohy epidural needle was inserted at the L 4  vertebral body level right of the midline via loss-of-resistance technique with negative heme and negative CSF return. A total of 4 mL of Preservative-Free normal saline with 40 mg of Kenalog injected incrementally via epidurally placed needle. Needle was removed.    A total of 40 mg of Kenalog was utilized for the procedure.    The patient tolerated the injection well.    PLAN:   1. Medications: We will continue presently prescribed medication oxycodone. 2. Will consider modification of treatment regimen pending response to treatment rendered on today's visit and follow-up evaluation. 3. The patient is to follow-up with primary care physician Dr.Niemeyer this week  regarding blood pressure and general medical condition status post lumbar epidural steroid injection performed on today's visit. 4. Surgical evaluation The patient prefers to avoid  5. Neurological evaluation. May consider  6. The patient may be a candidate for radiofrequency procedures, implantation device, and other treatment pending response to treatment and follow-up evaluation. 7. The patient has been advised to adhere to proper body mechanics and avoid activities which appear to aggravate condition. 8. The patient has been advised to call the [facility] prior to scheduled return appointment should there be significant change in condition or should there be sign  The patient is understanding and agrees with the suggested  treatment plan   Review of Systems     Objective:   Physical Exam        Assessment & Plan:

## 2014-12-01 NOTE — Progress Notes (Signed)
Safety precautions to be maintained throughout the outpatient stay will include: orient to surroundings, keep bed in low position, maintain call bell within reach at all times, provide assistance with transfer out of bed and ambulation.  

## 2014-12-02 ENCOUNTER — Telehealth: Payer: Self-pay

## 2014-12-02 NOTE — Telephone Encounter (Signed)
Pt states she is doing good 

## 2014-12-08 ENCOUNTER — Encounter: Payer: Self-pay | Admitting: Pain Medicine

## 2014-12-08 ENCOUNTER — Ambulatory Visit: Payer: Medicare Other | Attending: Pain Medicine | Admitting: Pain Medicine

## 2014-12-08 VITALS — BP 128/75 | HR 73 | Temp 98.1°F | Resp 16 | Ht 67.0 in | Wt 150.0 lb

## 2014-12-08 DIAGNOSIS — M79605 Pain in left leg: Secondary | ICD-10-CM | POA: Diagnosis present

## 2014-12-08 DIAGNOSIS — M79604 Pain in right leg: Secondary | ICD-10-CM | POA: Diagnosis present

## 2014-12-08 DIAGNOSIS — M8580 Other specified disorders of bone density and structure, unspecified site: Secondary | ICD-10-CM | POA: Diagnosis not present

## 2014-12-08 DIAGNOSIS — M5136 Other intervertebral disc degeneration, lumbar region: Secondary | ICD-10-CM | POA: Diagnosis not present

## 2014-12-08 DIAGNOSIS — M4186 Other forms of scoliosis, lumbar region: Secondary | ICD-10-CM | POA: Diagnosis not present

## 2014-12-08 DIAGNOSIS — M5126 Other intervertebral disc displacement, lumbar region: Secondary | ICD-10-CM | POA: Diagnosis not present

## 2014-12-08 DIAGNOSIS — M533 Sacrococcygeal disorders, not elsewhere classified: Secondary | ICD-10-CM | POA: Diagnosis not present

## 2014-12-08 DIAGNOSIS — M4316 Spondylolisthesis, lumbar region: Secondary | ICD-10-CM | POA: Diagnosis not present

## 2014-12-08 DIAGNOSIS — M47816 Spondylosis without myelopathy or radiculopathy, lumbar region: Secondary | ICD-10-CM

## 2014-12-08 DIAGNOSIS — M48062 Spinal stenosis, lumbar region with neurogenic claudication: Secondary | ICD-10-CM

## 2014-12-08 DIAGNOSIS — M48061 Spinal stenosis, lumbar region without neurogenic claudication: Secondary | ICD-10-CM

## 2014-12-08 DIAGNOSIS — M4806 Spinal stenosis, lumbar region: Secondary | ICD-10-CM | POA: Insufficient documentation

## 2014-12-08 DIAGNOSIS — M545 Low back pain: Secondary | ICD-10-CM | POA: Diagnosis present

## 2014-12-08 MED ORDER — OXYCODONE HCL 5 MG PO CAPS
ORAL_CAPSULE | ORAL | Status: DC
Start: 2014-12-08 — End: 2015-01-13

## 2014-12-08 NOTE — Patient Instructions (Signed)
PLAN   Continue present medication oxycodone  F/U PCP Dr. Brunetta Genera for evaliation of  BP and general medical  condition  F/U surgical evaluation. May consider pending follow-up evaluations  F/U neurological evaluation. May consider pending follow-up evaluations  May consider radiofrequency rhizolysis or intraspinal procedures pending response to present treatment and F/U evaluation   Patient to call [facility] should patient have concerns prior to scheduled return appointment.

## 2014-12-08 NOTE — Progress Notes (Signed)
   Subjective:    Patient ID: Verne Grain, female    DOB: 1940-08-03, 74 y.o.   MRN: [id number]  HPI  Patient is 74 year old female returns to pain management for further evaluation and treatment of pain involving the lower back and lower extremity region. Patient has had severe weakness of the lower extremities associated with severe lower back pain with pressure sensation of the lower back becoming more intense and radiating to the lower extremities producing lower extremity pain and weakness. Patient states that she has had tremendous improvement of strength of the lower extremities and is able to stand and walk for long periods of time since patient had lumbar epidural steroid injection. We will continue oxycodone and we'll avoid modifications of treatment regimen. The patient was in agreement with suggested treatment plan. The patient is undergone prior surgical evaluation as without past to consider surgical intervention.       Review of Systems     Objective:   Physical Exam  There was tenderness of the splenius capitis and occipitalis musculature regions palpation which reproduced pain of mild degree. There was mild tenderness of the cervical facet cervical paraspinal there was tenderness over the acromioclavicular glenohumeral joint region a mild degree. And the thoracic facet thoracic paraspinal musculature region of mild degree. There was unremarkable Spurling's maneuver. Patient was with decreased grip strength. Tinel and Phalen's maneuver were without increase of pain significant degree. There was tenderness over the lower thoracic paraspinal muscle and thoracic facet region of moderate degree. Lateral bending and rotation and extension and palpation of the lumbar facets reproduce moderate discomfort. Straight leg raising tolerates approximately 20 without increase of pain with dorsiflexion noted. There was negative clonus negative Homans. There was tenderness over the PSIS PSIS  region and gluteal and piriformis musculature region of mild to moderate degree. No definite increase of pain with pressure prior to the ilium with patient in lateral decubitus position. There was negative clonus and negative Homans.abdomen was nontender with no costovertebral maintenance noted.        Assessment & Plan:    Degenerative disc disease lumbar spine Degenerative changes L2-L3 L3-L4 with disc bulging and L3-4 facet arthropathy, severe spinal canal stenosis L4-L5 disc bulging and multilevel facet arthropathy, osteopenia, scoliosis, severe spinal canal stenosis L4-L5 disc bulging and arthropathy, retrolisthesis on the right L2-3 and L3-4  Lumbar facet syndrome  Spinal stenosis with neurogenic claudication  Sacroiliac joint dysfunction     PLAN   Continue present medicationOxycodone  F/U PCP  Dr.niemeyer for evaliation of  BP and general medical  condition  F/U surgical evaluation. Patient is status post surgical evaluation and is not felt to be surgical candidate  F/U neurological evaluation. May consider pending follow-up evaluations  May consider radiofrequency rhizolysis or intraspinal procedures pending response to present treatment and F/U evaluation   Patient to call [facility] should patient have concerns prior to scheduled return appointment.

## 2014-12-08 NOTE — Progress Notes (Signed)
Safety precautions to be maintained throughout the outpatient stay will include: orient to surroundings, keep bed in low position, maintain call bell within reach at all times, provide assistance with transfer out of bed and ambulation.  

## 2014-12-10 ENCOUNTER — Encounter: Payer: Medicare Other | Admitting: Pain Medicine

## 2015-01-13 ENCOUNTER — Ambulatory Visit: Payer: Medicare Other | Attending: Pain Medicine | Admitting: Pain Medicine

## 2015-01-13 ENCOUNTER — Encounter: Payer: Self-pay | Admitting: Pain Medicine

## 2015-01-13 VITALS — BP 123/79 | HR 69 | Temp 98.1°F | Resp 16 | Ht 67.0 in | Wt 145.0 lb

## 2015-01-13 DIAGNOSIS — M48062 Spinal stenosis, lumbar region with neurogenic claudication: Secondary | ICD-10-CM

## 2015-01-13 DIAGNOSIS — H9319 Tinnitus, unspecified ear: Secondary | ICD-10-CM | POA: Insufficient documentation

## 2015-01-13 DIAGNOSIS — M8580 Other specified disorders of bone density and structure, unspecified site: Secondary | ICD-10-CM | POA: Insufficient documentation

## 2015-01-13 DIAGNOSIS — M5136 Other intervertebral disc degeneration, lumbar region: Secondary | ICD-10-CM | POA: Insufficient documentation

## 2015-01-13 DIAGNOSIS — M5126 Other intervertebral disc displacement, lumbar region: Secondary | ICD-10-CM | POA: Diagnosis not present

## 2015-01-13 DIAGNOSIS — M47816 Spondylosis without myelopathy or radiculopathy, lumbar region: Secondary | ICD-10-CM

## 2015-01-13 DIAGNOSIS — M4806 Spinal stenosis, lumbar region: Secondary | ICD-10-CM | POA: Diagnosis not present

## 2015-01-13 DIAGNOSIS — M533 Sacrococcygeal disorders, not elsewhere classified: Secondary | ICD-10-CM

## 2015-01-13 DIAGNOSIS — M48061 Spinal stenosis, lumbar region without neurogenic claudication: Secondary | ICD-10-CM

## 2015-01-13 MED ORDER — OXYCODONE HCL 5 MG PO CAPS: ORAL_CAPSULE | ORAL | Status: DC

## 2015-01-13 NOTE — Patient Instructions (Signed)
PLAN   Continue present medication oxycodone  F/U PCP Dr. Brunetta Genera for evaliation of  BP and general medical  condition  F/U surgical evaluation. May consider pending follow-up evaluations  F/U neurological evaluation. May consider pending follow-up evaluations  May consider radiofrequency rhizolysis or intraspinal procedures pending response to present treatment and F/U evaluation   Patient to call [facility] should patient have concerns prior to scheduled return appointment.

## 2015-01-13 NOTE — Progress Notes (Signed)
   Subjective:    Patient ID: Kristi [name], female    DOB: 09/01/1940, 74 y.o.   MRN: [id number]  HPI Patient is 74 year old female who returns to [place] for further evaluation and treatment of pain involving the region of the lower back and lower extremity regions. Patient states that her pain is well controlled at this time she has increased strength of the lower extremities and is able to perform a tends of daily living without weakness or pain of the lower extremities interfere with activities of daily living. The patient is status post lumbar epidural steroid injection. We will continue patient's oxycodone and will consider patient for modification of treatment regimen should there be significant change in condition. The patient was with understanding and in agreement with suggested treatment plan      Review of Systems     Objective:   Physical Exam  There was minimal tinnitus of the splenius capitis and occipitalis musculature regions. There was mild tennis over the region cervical facet cervical paraspinal musculature region. There was minimal tends of the acromioclavicular and glenohumeral joint region. Tinel and Phalen's maneuver were without increased pain of significant degree. Patient appeared to be with slightly decreased grip strength. There was tenderness over the thoracic facet thoracic paraspinal musculature region with moderate muscle spasms of the thoracic region noted. Palpation over the lumbar paraspinal muscles lumbar facet region was with moderate tends to palpation there was tenderness over the gluteal and piriformis PSIS and PII S region with mild tenderness of the greater trochanteric region iliotibial band region. Straight leg raising was tolerates approximately 20 without increased pain with dorsiflexion noted. There was negative clonus negative Homans. There was mild tenderness of the gluteal and piriformis musculature regions. There was mild tends to  palpation of the greater trochanteric region and iliotibial band region and mild increased pain with pressure applied to the ilium with patient in lateral decubitus position. Abdomen was nontender with no costovertebral angle tenderness noted.    Assessment & Plan:  Degenerative disc disease lumbar spine Degenerative changes L2-L3 L3-L4 with disc bulging and L3-4 facet arthropathy, severe spinal canal stenosis L4-L5 disc bulging and multilevel facet arthropathy, osteopenia, scoliosis, severe spinal canal stenosis L4-L5 disc bulging and arthropathy, retrolisthesis on the right L2-3 and L3-4  Lumbar facet syndrome  Spinal stenosis with neurogenic claudication  Sacroiliac joint dysfunction    PLAN   Continue present medication oxycodone  F/U PCP  Dr.niemeyer for evaliation of  BP and general medical  condition  F/U surgical evaluation. Patient is status post surgical evaluation and is not felt to be surgical candidate  F/U neurological evaluation. May consider pending follow-up evaluations  May consider radiofrequency rhizolysis or intraspinal procedures pending response to present treatment and F/U evaluation   Patient to call [facility] should patient have concerns prior to scheduled return appointment

## 2015-01-21 ENCOUNTER — Other Ambulatory Visit: Payer: Self-pay | Admitting: Pain Medicine

## 2015-02-02 ENCOUNTER — Telehealth: Payer: Self-pay | Admitting: Pain Medicine

## 2015-02-02 NOTE — Telephone Encounter (Signed)
Dena And Juliann Pulse,  Need to schedule Lumbosacral Selective Nerve Root Block for Monday, November 28 or Wednesday, November 30 Check for aspirin and similar medications and discussed with me please

## 2015-02-02 NOTE — Telephone Encounter (Signed)
Pain across lower back, worse in right leg. Wants to have it done before Nov. 26.

## 2015-02-02 NOTE — Telephone Encounter (Signed)
Having leg pain would like to get procedure

## 2015-02-02 NOTE — Telephone Encounter (Signed)
Patient does not take any blood thinners. She can be scheduled.

## 2015-02-02 NOTE — Telephone Encounter (Signed)
Nurses and  [facility] Call patient and described pain for me Scheduled for procedure for Monday, 02/16/2015 and check her aspirin and similar medications and discussed with me please

## 2015-02-09 ENCOUNTER — Ambulatory Visit: Payer: Medicare Other | Attending: Pain Medicine | Admitting: Pain Medicine

## 2015-02-09 VITALS — BP 137/78 | HR 60 | Temp 98.5°F | Resp 18 | Ht 67.0 in | Wt 145.0 lb

## 2015-02-09 DIAGNOSIS — M5116 Intervertebral disc disorders with radiculopathy, lumbar region: Secondary | ICD-10-CM | POA: Insufficient documentation

## 2015-02-09 DIAGNOSIS — M4806 Spinal stenosis, lumbar region: Secondary | ICD-10-CM | POA: Diagnosis not present

## 2015-02-09 DIAGNOSIS — M4316 Spondylolisthesis, lumbar region: Secondary | ICD-10-CM | POA: Diagnosis not present

## 2015-02-09 DIAGNOSIS — M533 Sacrococcygeal disorders, not elsewhere classified: Secondary | ICD-10-CM

## 2015-02-09 DIAGNOSIS — M47816 Spondylosis without myelopathy or radiculopathy, lumbar region: Secondary | ICD-10-CM

## 2015-02-09 DIAGNOSIS — M545 Low back pain: Secondary | ICD-10-CM | POA: Diagnosis present

## 2015-02-09 DIAGNOSIS — M419 Scoliosis, unspecified: Secondary | ICD-10-CM | POA: Insufficient documentation

## 2015-02-09 DIAGNOSIS — M5126 Other intervertebral disc displacement, lumbar region: Secondary | ICD-10-CM | POA: Insufficient documentation

## 2015-02-09 DIAGNOSIS — M48062 Spinal stenosis, lumbar region with neurogenic claudication: Secondary | ICD-10-CM

## 2015-02-09 DIAGNOSIS — M79604 Pain in right leg: Secondary | ICD-10-CM | POA: Diagnosis present

## 2015-02-09 DIAGNOSIS — M79605 Pain in left leg: Secondary | ICD-10-CM | POA: Diagnosis present

## 2015-02-09 DIAGNOSIS — M48061 Spinal stenosis, lumbar region without neurogenic claudication: Secondary | ICD-10-CM

## 2015-02-09 DIAGNOSIS — M8580 Other specified disorders of bone density and structure, unspecified site: Secondary | ICD-10-CM | POA: Diagnosis not present

## 2015-02-09 DIAGNOSIS — M5136 Other intervertebral disc degeneration, lumbar region: Secondary | ICD-10-CM

## 2015-02-09 MED ORDER — SODIUM CHLORIDE 0.9 % IJ SOLN: 20.0000 mL | Freq: Once | INTRAMUSCULAR | Status: DC

## 2015-02-09 MED ORDER — LIDOCAINE HCL (PF) 1 % IJ SOLN: 10.0000 mL | Freq: Once | INTRAMUSCULAR | Status: DC

## 2015-02-09 MED ORDER — FENTANYL CITRATE (PF) 100 MCG/2ML IJ SOLN
INTRAMUSCULAR | Status: AC
  Administered 2015-02-09: 100 ug via INTRAVENOUS
  Filled 2015-02-09: qty 2

## 2015-02-09 MED ORDER — BUPIVACAINE HCL (PF) 0.25 % IJ SOLN
INTRAMUSCULAR | Status: AC
  Administered 2015-02-09: 30 mL
  Filled 2015-02-09: qty 30

## 2015-02-09 MED ORDER — BUPIVACAINE HCL (PF) 0.25 % IJ SOLN
30.0000 mL | Freq: Once | INTRAMUSCULAR | Status: AC
  Administered 2015-02-09: 30 mL

## 2015-02-09 MED ORDER — CIPROFLOXACIN IN D5W 400 MG/200ML IV SOLN
400.0000 mg | Freq: Once | INTRAVENOUS | Status: AC
  Administered 2015-02-09: 400 mg via INTRAVENOUS

## 2015-02-09 MED ORDER — CIPROFLOXACIN HCL 250 MG PO TABS: 250.0000 mg | ORAL_TABLET | Freq: Two times a day (BID) | ORAL | Status: DC

## 2015-02-09 MED ORDER — ORPHENADRINE CITRATE 30 MG/ML IJ SOLN: 60.0000 mg | Freq: Once | INTRAMUSCULAR | Status: DC

## 2015-02-09 MED ORDER — LACTATED RINGERS IV SOLN: 1000.0000 mL | INTRAVENOUS | Status: DC

## 2015-02-09 MED ORDER — ORPHENADRINE CITRATE 30 MG/ML IJ SOLN
INTRAMUSCULAR | Status: AC
  Filled 2015-02-09: qty 2

## 2015-02-09 MED ORDER — MIDAZOLAM HCL 5 MG/5ML IJ SOLN
5.0000 mg | Freq: Once | INTRAMUSCULAR | Status: AC
  Administered 2015-02-09: 2 mg via INTRAVENOUS

## 2015-02-09 MED ORDER — TRIAMCINOLONE ACETONIDE 40 MG/ML IJ SUSP
INTRAMUSCULAR | Status: AC
  Administered 2015-02-09: 40 mg
  Filled 2015-02-09: qty 1

## 2015-02-09 MED ORDER — OXYCODONE HCL 5 MG PO CAPS: ORAL_CAPSULE | ORAL | Status: DC

## 2015-02-09 MED ORDER — CIPROFLOXACIN IN D5W 400 MG/200ML IV SOLN
INTRAVENOUS | Status: AC
  Administered 2015-02-09: 400 mg via INTRAVENOUS
  Filled 2015-02-09: qty 200

## 2015-02-09 MED ORDER — MIDAZOLAM HCL 5 MG/5ML IJ SOLN
INTRAMUSCULAR | Status: AC
  Administered 2015-02-09: 2 mg via INTRAVENOUS
  Filled 2015-02-09: qty 5

## 2015-02-09 MED ORDER — TRIAMCINOLONE ACETONIDE 40 MG/ML IJ SUSP
40.0000 mg | Freq: Once | INTRAMUSCULAR | Status: AC
  Administered 2015-02-09: 40 mg

## 2015-02-09 MED ORDER — FENTANYL CITRATE (PF) 100 MCG/2ML IJ SOLN
100.0000 ug | Freq: Once | INTRAMUSCULAR | Status: AC
  Administered 2015-02-09: 100 ug via INTRAVENOUS

## 2015-02-09 NOTE — Progress Notes (Signed)
Subjective:    Patient ID: Kristi [name], female    DOB: 1940/07/10, 74 y.o.   MRN: [id number]  HPI  PROCEDURE PERFORMED: Lumbosacral selective nerve root block   NOTE: The patient is a 74 y.o. female who returns to [facility] for further evaluation and treatment of pain involving the lumbar and lower extremity region. Studies consisting of MRI has revealed the patient to be with evidence of Degenerative disc disease lumbar spine Degenerative changes L2-L3 L3-L4 with disc bulging and L3-4 facet arthropathy, severe spinal canal stenosis L4-L5 disc bulging and multilevel facet arthropathy, osteopenia, scoliosis, severe spinal canal stenosis L4-L5 disc bulging and arthropathy, retrolisthesis on the right L2-3 and L3-4 there is concern regarding significant component of patient's pain began due to lumbar radiculopathy as well as lumbar stenosis with neurogenic claudication. There is concern regarding intraspinal abnormalities contributing to the patient's symptomatology. The risks, benefits, and expectations of the procedure have been explained to the patient who was understanding and in agreement with suggested treatment plan. We will proceed with interventional treatment as discussed and as explained to the patient. The patient is understanding and in agreement with suggested treatment plan.   DESCRIPTION OF PROCEDURE: Lumbosacral selective nerve root block with IV Versed, IV fentanyl conscious sedation, EKG, blood pressure, pulse, and pulse oximetry monitoring. The procedure was performed with the patient in the prone position under fluoroscopic guidance. With the patient in the prone position, Betadine prep of proposed entry site was performed. Local anesthetic skin wheal of proposed needle entry site was prepared with 1.5% plain lidocaine with AP view of the lumbosacral spine.   PROCEDURE #1: Needle placement at the right L 2 vertebral body: A 22 -gauge needle was inserted at the  inferior border of the transverse process of the vertebral body with needle placed medial to the midline of the transverse process on AP view of the lumbosacral spine.   NEEDLE PLACEMENT AT  L3, L4, and L5  VERTEBRAL BODY LEVELS  Needle  placement was accomplished at L3, L4, and L5  vertebral body levels on the right side exactly as was accomplished at the L2  vertebral body level  and utilizing the same technique and under fluoroscopic guidance.    Needle placement was then verified on lateral view at all levels with needle tip documented to be in the posterior superior quadrant of the intervertebral foramen of  L 2, L3, L4, and L5. Following negative aspiration for heme and CSF at each level, each level was injected with 3 mL of 0.25% bupivacaine with Kenalog.       The patient tolerated the procedure well. A total of 10 mg of Kenalog was utilized for the procedure.   PLAN:  1. Medications: Will continue presently prescribed medications. Oxycodone 2. The patient is to undergo follow-up evaluation with PCP Dr.Niemeyer  for evaluation of blood pressure and general medical condition status post procedure performed on today's visit. 3. Surgical follow-up evaluation.Patient with prior evaluation without plans for surgical intervention  4. Neurological evaluation.May consider  5. May consider radiofrequency procedures, implantation type procedures and other treatment pending response to treatment and follow-up evaluation. 6. The patient has been advise do adhere to proper body mechanics and avoid activities which may aggravate condition. 7. The patient has been advised to call the Pain Management Center prior to scheduled return appointment should there be significant change in the patient's condition or should the patient have other concerns regarding condition prior to scheduled return appointment.  Review of Systems     Objective:   Physical Exam        Assessment & Plan:

## 2015-02-09 NOTE — Patient Instructions (Addendum)
PLAN  Continue present medication Cipro and begin taking antibiotic as prescribed. Please obtain your antibiotic Cipro today  and begin taking antibiotic today  F/U PCP Dr Brunetta Genera for evaliation of  BP and general medical  condition.  F/U surgical evaluation. May consider pending follow-up evaluations  F/U neurological evaluation. May consider pending follow-up evaluations  May consider radiofrequency rhizolysis or intraspinal procedures pending response to present treatment and F/U evaluation.  Patient to call Pain Management Center should patient have concerns prior to scheduled return appointment. Pain Management Discharge Instructions  General Discharge Instructions :  If you need to reach your doctor call: Monday-Friday 8:00 am - 4:00 pm at [phone] or toll free [phone].  After clinic hours [phone] to have operator reach doctor.  Bring all of your medication bottles to all your appointments in the pain clinic.  To cancel or reschedule your appointment with Pain Management please remember to call 24 hours in advance to avoid a fee.  Refer to the educational materials which you have been given on: General Risks, I had my Procedure. Discharge Instructions, Post Sedation.  Post Procedure Instructions:  The drugs you were given will stay in your system until tomorrow, so for the next 24 hours you should not drive, make any legal decisions or drink any alcoholic beverages.  You may eat anything you prefer, but it is better to start with liquids then soups and crackers, and gradually work up to solid foods.  Please notify your doctor immediately if you have any unusual bleeding, trouble breathing or pain that is not related to your normal pain.  Depending on the type of procedure that was done, some parts of your body may feel week and/or numb.  This usually clears up by tonight or the next day.  Walk with the use of an assistive device or accompanied by an adult for  the 24 hours.  You may use ice on the affected area for the first 24 hours.  Put ice in a Ziploc bag and cover with a towel and place against area 15 minutes on 15 minutes off.  You may switch to heat after 24 hours.Selective Nerve Root Block Patient Information  Description: Specific nerve roots exit the spinal canal and these nerves can be compressed and inflamed by a bulging disc and bone spurs.  By injecting steroids on the nerve root, we can potentially decrease the inflammation surrounding these nerves, which often leads to decreased pain.  Also, by injecting local anesthesia on the nerve root, this can provide [place] helpful information to give to your referring doctor if it decreases your pain.  Selective nerve root blocks can be done along the spine from the neck to the low back depending on the location of your pain.   After numbing the skin with local anesthesia, a small needle is passed to the nerve root and the position of the needle is verified using x-ray pictures.  After the needle is in correct position, we then deposit the medication.  You may experience a pressure sensation while this is being done.  The entire block usually lasts less than 15 minutes.  Conditions that may be treated with selective nerve root blocks:  Low back and leg pain  Spinal stenosis  Diagnostic block prior to potential surgery  Neck and arm pain  Post laminectomy syndrome  Preparation for the injection:  1. Do not eat any solid food or dairy products within 6 hours of your appointment. 2. You may drink clear  liquids up to 2 hours before an appointment.  Clear liquids include water, black coffee, juice or soda.  No milk or cream please. 3. You may take your regular medications, including pain medications, with a sip of water before your appointment.  Diabetics should hold regular insulin (if taken separately) and take 1/2 normal NPH dose the morning of the procedure.  Carry some sugar containing items  with you to your appointment. 4. A driver must accompany you and be prepared to drive you home after your procedure. 5. Bring all your current medications with you. 6. An IV may be inserted and sedation may be given at the discretion of the physician. 7. A blood pressure cuff, EKG, and other monitors will often be applied during the procedure.  Some patients may need to have extra oxygen administered for a short period. 8. You will be asked to provide medical information, including allergies, prior to the procedure.  We must know immediately if you are taking blood  Thinners (like Coumadin) or if you are allergic to IV iodine contrast (dye).  Possible side-effects: All are usually temporary  Bleeding from needle site  Light headedness  Numbness and tingling  Decreased blood pressure  Weakness in arms/legs  Pressure sensation in back/neck  Pain at injection site (several days)  Possible complications: All are extremely rare  Infection  Nerve injury  Spinal headache (a headache wore with upright position)  Call if you experience:  Fever/chills associated with headache or increased back/neck pain  Headache worsened by an upright position  New onset weakness or numbness of an extremity below the injection site  Hives or difficulty breathing (go to the emergency room)  Inflammation or drainage at the injection site(s)  Severe back/neck pain greater than usual  New symptoms which are concerning to you  Please note:  Although the local anesthetic injected can often make your back or neck feel good for several hours after the injection the pain will likely return.  It takes 3-5 days for steroids to work on the nerve root. You may not notice any pain relief for at least one week.  If effective, we will often do a series of 3 injections spaced 3-6 weeks apart to maximally decrease your pain.    If you have any questions, please call [phone] [facility]

## 2015-02-10 ENCOUNTER — Telehealth: Payer: Self-pay | Admitting: *Deleted

## 2015-02-10 NOTE — Telephone Encounter (Signed)
Spoke with patient and she verbalizes no complications from procedure on yesterday,02/09/2015

## 2015-02-11 ENCOUNTER — Ambulatory Visit: Payer: Medicare Other | Admitting: Pain Medicine

## 2015-03-10 ENCOUNTER — Ambulatory Visit: Payer: Medicare Other | Attending: Pain Medicine | Admitting: Pain Medicine

## 2015-03-10 ENCOUNTER — Encounter: Payer: Self-pay | Admitting: Pain Medicine

## 2015-03-10 VITALS — BP 122/80 | HR 76 | Temp 97.5°F | Resp 16 | Ht 66.0 in | Wt 145.0 lb

## 2015-03-10 DIAGNOSIS — M47816 Spondylosis without myelopathy or radiculopathy, lumbar region: Secondary | ICD-10-CM | POA: Diagnosis not present

## 2015-03-10 DIAGNOSIS — M545 Low back pain: Secondary | ICD-10-CM | POA: Diagnosis present

## 2015-03-10 DIAGNOSIS — M533 Sacrococcygeal disorders, not elsewhere classified: Secondary | ICD-10-CM

## 2015-03-10 DIAGNOSIS — M4316 Spondylolisthesis, lumbar region: Secondary | ICD-10-CM | POA: Insufficient documentation

## 2015-03-10 DIAGNOSIS — M4806 Spinal stenosis, lumbar region: Secondary | ICD-10-CM | POA: Insufficient documentation

## 2015-03-10 DIAGNOSIS — M8580 Other specified disorders of bone density and structure, unspecified site: Secondary | ICD-10-CM | POA: Diagnosis not present

## 2015-03-10 DIAGNOSIS — M48062 Spinal stenosis, lumbar region with neurogenic claudication: Secondary | ICD-10-CM

## 2015-03-10 DIAGNOSIS — M5126 Other intervertebral disc displacement, lumbar region: Secondary | ICD-10-CM | POA: Diagnosis not present

## 2015-03-10 DIAGNOSIS — M79604 Pain in right leg: Secondary | ICD-10-CM | POA: Diagnosis present

## 2015-03-10 DIAGNOSIS — M5136 Other intervertebral disc degeneration, lumbar region: Secondary | ICD-10-CM | POA: Diagnosis not present

## 2015-03-10 DIAGNOSIS — M48061 Spinal stenosis, lumbar region without neurogenic claudication: Secondary | ICD-10-CM

## 2015-03-10 DIAGNOSIS — M79605 Pain in left leg: Secondary | ICD-10-CM | POA: Diagnosis present

## 2015-03-10 MED ORDER — OXYCODONE HCL 5 MG PO TABS: ORAL_TABLET | ORAL | Status: DC

## 2015-03-10 MED ORDER — OXYCODONE HCL 5 MG PO CAPS: ORAL_CAPSULE | ORAL | Status: DC

## 2015-03-10 NOTE — Patient Instructions (Addendum)
PLAN   Continue present medication oxycodone   Llumbar epidural steroid injection to be performed at time return appointment  F/U PCP Dr. Brunetta Genera for evaliation of  BP and general medical  condition  F/U surgical evaluation. May consider pending follow-up evaluations  F/U neurological evaluation. May consider pending follow-up evaluations  May consider radiofrequency rhizolysis or intraspinal procedures pending response to present treatment and F/U evaluation   Patient to call Pain Management Center should patient have concerns prior to scheduled return appointment.Epidural Steroid Injection Patient Information  Description: The epidural space surrounds the nerves as they exit the spinal cord.  In some patients, the nerves can be compressed and inflamed by a bulging disc or a tight spinal canal (spinal stenosis).  By injecting steroids into the epidural space, we can bring irritated nerves into direct contact with a potentially helpful medication.  These steroids act directly on the irritated nerves and can reduce swelling and inflammation which often leads to decreased pain.  Epidural steroids may be injected anywhere along the spine and from the neck to the low back depending upon the location of your pain.   After numbing the skin with local anesthetic (like Novocaine), a small needle is passed into the epidural space slowly.  You may experience a sensation of pressure while this is being done.  The entire block usually last less than 10 minutes.  Conditions which may be treated by epidural steroids:   Low back and leg pain  Neck and arm pain  Spinal stenosis  Post-laminectomy syndrome  Herpes zoster (shingles) pain  Pain from compression fractures  Preparation for the injection:  1. Do not eat any solid food or dairy products within 6 hours of your appointment.  2. You may drink clear liquids up to 2 hours before appointment.  Clear liquids include water, black coffee,  juice or soda.  No milk or cream please. 3. You may take your regular medication, including pain medications, with a sip of water before your appointment  Diabetics should hold regular insulin (if taken separately) and take 1/2 normal NPH dos the morning of the procedure.  Carry some sugar containing items with you to your appointment. 4. A driver must accompany you and be prepared to drive you home after your procedure.  5. Bring all your current medications with your. 6. An IV may be inserted and sedation may be given at the discretion of the physician.   7. A blood pressure cuff, EKG and other monitors will often be applied during the procedure.  Some patients may need to have extra oxygen administered for a short period. 8. You will be asked to provide medical information, including your allergies, prior to the procedure.  We must know immediately if you are taking blood thinners (like Coumadin/Warfarin)  Or if you are allergic to IV iodine contrast (dye). We must know if you could possible be pregnant.  Possible side-effects:  Bleeding from needle site  Infection (rare, may require surgery)  Nerve injury (rare)  Numbness & tingling (temporary)  Difficulty urinating (rare, temporary)  Spinal headache ( a headache worse with upright posture)  Light -headedness (temporary)  Pain at injection site (several days)  Decreased blood pressure (temporary)  Weakness in arm/leg (temporary)  Pressure sensation in back/neck (temporary)  Call if you experience:  Fever/chills associated with headache or increased back/neck pain.  Headache worsened by an upright position.  New onset weakness or numbness of an extremity below the injection site  Hives or  difficulty breathing (go to the emergency room)  Inflammation or drainage at the infection site  Severe back/neck pain  Any new symptoms which are concerning to you  Please note:  Although the local anesthetic injected can often  make your back or neck feel good for several hours after the injection, the pain will likely return.  It takes 3-7 days for steroids to work in the epidural space.  You may not notice any pain relief for at least that one week.  If effective, we will often do a series of three injections spaced 3-6 weeks apart to maximally decrease your pain.  After the initial series, we generally will wait several months before considering a repeat injection of the same type.  If you have any questions, please call [phone] [facility]

## 2015-03-10 NOTE — Progress Notes (Signed)
   Subjective:    Patient ID: Kristi [name], female    DOB: Dec 06, 1940, 74 y.o.   MRN: [id number]  HPI   The patient is a 74 year old female who returns to pain management Center for further evaluation and treatment of pain involving the region of the lower back and lower extremity region predominantly. Patient states that she has had return of pain involving the lower back and lower extremity region with pain becoming more intense as patient spends more time on the feet. The patient states that the lower extremities become weak the patient stands for significant period of time. We discussed patient's condition and will consider patient for lumbar epidural steroid injection to be performed at time return appointment. Patient is undergone prior surgical evaluation and is without plans for surgical intervention. The patient will continue oxycodone as prescribed. We discuss surgical reevaluation which patient prefers to avoid. Patient wished to proceed with lumbar epidural steroid injection at time return appointment in attempt to decrease severity of symptoms, minimize progression of symptoms, and avoid need for more involved treatment. The patient was in agreement with suggested treatment plan we will proceed with what is felt to be medically necessary procedure time return appointment lumbar epidural steroid injection. The patient is with understanding and agreed to suggested treatment plan.     Review of Systems     Objective:   Physical Exam  There was tenderness to palpation of paraspinal musculature region of the cervical region cervical facet region a mild degree there was mild tenderness to palpation over the splenius capitis and occipitalis musculature region. Palpation of the acromioclavicular and glenohumeral joint regions reproduces minimal discomfort. Tinel and Phalen's maneuver were without increased pain of significant degree. Patient appeared to be with bilaterally equal grip  strength. Palpation over the thoracic facet thoracic paraspinal musculature region was attends to palpation of mild to moderate degree with no crepitus of the thoracic region noted. Palpation over the lumbar paraspinal muscular region lumbar facet region was attends to palpation of moderate degree with lateral bending rotation extension and palpation of the lumbar facets reproducing moderate discomfort. Palpation over the PSIS and PII S regions reproduce mild to moderate discomfort. Straight leg raising was tolerates approximately 20 without increased pain with dorsiflexion noted. DTRs were difficult to elicit patient had difficulty relaxing. EHL strength appeared to be decreased and there was negative clonus negative Homans. Abdomen nontender with no costovertebral tenderness noted.       Assessment & Plan:    Degenerative disc disease lumbar spine Degenerative changes L2-L3 L3-L4 with disc bulging and L3-4 facet arthropathy, severe spinal canal stenosis L4-L5 disc bulging and multilevel facet arthropathy, osteopenia, scoliosis, severe spinal canal stenosis L4-L5 disc bulging and arthropathy, retrolisthesis on the right L2-3 and L3-4  Lumbar facet syndrome  Spinal stenosis with neurogenic claudication  Sacroiliac joint dysfunction   PLAN   Continue present medication oxycodone   Llumbar epidural steroid injection to be performed at time return appointment  F/U PCP Dr. Brunetta Genera for evaliation of  BP and general medical  condition  F/U surgical evaluation. May consider pending follow-up evaluations  F/U neurological evaluation. May consider pending follow-up evaluations  May consider radiofrequency rhizolysis or intraspinal procedures pending response to present treatment and F/U evaluation   Patient to call Pain Management Center should patient have concerns prior to scheduled return appointment

## 2015-03-10 NOTE — Progress Notes (Signed)
Safety precautions to be maintained throughout the outpatient stay will include: orient to surroundings, keep bed in low position, maintain call bell within reach at all times, provide assistance with transfer out of bed and ambulation.  

## 2015-03-14 ENCOUNTER — Emergency Department
Admission: EM | Admit: 2015-03-14 | Discharge: 2015-03-14 | Disposition: A | Discharge status: Home / Self Care | Payer: Medicare Other | Attending: Emergency Medicine | Admitting: Emergency Medicine

## 2015-03-14 ENCOUNTER — Emergency Department: Payer: Medicare Other

## 2015-03-14 ENCOUNTER — Encounter: Payer: Self-pay | Admitting: Emergency Medicine

## 2015-03-14 DIAGNOSIS — Z792 Long term (current) use of antibiotics: Secondary | ICD-10-CM | POA: Diagnosis not present

## 2015-03-14 DIAGNOSIS — Z880 Allergy status to penicillin: Secondary | ICD-10-CM | POA: Diagnosis not present

## 2015-03-14 DIAGNOSIS — S52615A Nondisplaced fracture of left ulna styloid process, initial encounter for closed fracture: Secondary | ICD-10-CM | POA: Diagnosis not present

## 2015-03-14 DIAGNOSIS — W01198A Fall on same level from slipping, tripping and stumbling with subsequent striking against other object, initial encounter: Secondary | ICD-10-CM | POA: Diagnosis not present

## 2015-03-14 DIAGNOSIS — Y9289 Other specified places as the place of occurrence of the external cause: Secondary | ICD-10-CM | POA: Diagnosis not present

## 2015-03-14 DIAGNOSIS — Z79899 Other long term (current) drug therapy: Secondary | ICD-10-CM | POA: Insufficient documentation

## 2015-03-14 DIAGNOSIS — Y9389 Activity, other specified: Secondary | ICD-10-CM | POA: Insufficient documentation

## 2015-03-14 DIAGNOSIS — Y998 Other external cause status: Secondary | ICD-10-CM | POA: Diagnosis not present

## 2015-03-14 DIAGNOSIS — Z8781 Personal history of (healed) traumatic fracture: Secondary | ICD-10-CM

## 2015-03-14 DIAGNOSIS — S6992XA Unspecified injury of left wrist, hand and finger(s), initial encounter: Secondary | ICD-10-CM | POA: Diagnosis present

## 2015-03-14 DIAGNOSIS — S62102A Fracture of unspecified carpal bone, left wrist, initial encounter for closed fracture: Secondary | ICD-10-CM

## 2015-03-14 DIAGNOSIS — I10 Essential (primary) hypertension: Secondary | ICD-10-CM | POA: Insufficient documentation

## 2015-03-14 MED ORDER — LIDOCAINE HCL 2 % IJ SOLN
5.0000 mL | Freq: Once | INTRAMUSCULAR | Status: DC
  Filled 2015-03-14: qty 10

## 2015-03-14 MED ORDER — OXYCODONE-ACETAMINOPHEN 5-325 MG PO TABS
1.0000 | ORAL_TABLET | Freq: Once | ORAL | Status: AC
  Administered 2015-03-14: 1 via ORAL
  Filled 2015-03-14: qty 1

## 2015-03-14 MED ORDER — LIDOCAINE HCL (PF) 1 % IJ SOLN
INTRAMUSCULAR | Status: AC
  Administered 2015-03-14: 16:00:00
  Filled 2015-03-14: qty 5

## 2015-03-14 NOTE — ED Notes (Signed)
Sling to L arm, pt up in wheelchair with pillow on lap and arm resting atop pillow. Await dc inst.

## 2015-03-14 NOTE — ED Provider Notes (Signed)
[facility] Emergency Department Provider Note ____________________________________________  Time seen: 1225  I have reviewed the triage vital signs and the nursing notes.  HISTORY  Chief Complaint  Wrist Pain and Fall  HPI Kristi [name] is a 74 y.o. female reports to the ED for evaluation of injury sustained after fall this morning. She describes tripping landing on her left wrist as well as hitting the side of her face. She reports pain and deformity to the left wrist and some mild swelling to the face. She denies any loss of consciousness, nausea, vomiting, dizziness. She has a medical history of severe rheumatoid arthritis and chronic low back pain. She noticed pain to her hand and wrist at an 8/10 in triage.  Past Medical History  Diagnosis Date  . Hyperlipidemia   . Spinal stenosis   . Anxiety   . Myocardial infarction ([place])   . Depression   . Hypothyroid   . Hypertension     Patient Active Problem List   Diagnosis Date Noted  . Facet syndrome, lumbar 08/21/2014  . DDD (degenerative disc disease), lumbar 08/13/2014  . Spinal stenosis of lumbar region 08/13/2014  . Spinal stenosis, lumbar region, with neurogenic claudication 08/13/2014  . Sacroiliac joint dysfunction of both sides 08/13/2014    Past Surgical History  Procedure Laterality Date  . Abdominal hysterectomy    . Coronary stent placement    . Coronary artery bypass graft    . S/p pacer insertion      Current Outpatient Rx  Name  Route  Sig  Dispense  Refill  . ALPRAZolam (XANAX) 0.5 MG tablet   Oral   Take 0.5 mg by mouth at bedtime as needed for anxiety.         . cholecalciferol (VITAMIN D) 1000 UNITS tablet   Oral   Take 1,000 Units by mouth daily.         . ciprofloxacin (CIPRO) 250 MG tablet   Oral   Take 1 tablet (250 mg total) by mouth 2 (two) times daily. Patient not taking: Reported on 03/10/2015   14 tablet   0   . docusate sodium (COLACE) 100 MG  capsule   Oral   Take 100 mg by mouth 2 (two) times daily.         . furosemide (LASIX) 20 MG tablet   Oral   Take 20 mg by mouth.         . levothyroxine (SYNTHROID, LEVOTHROID) 100 MCG tablet   Oral   Take 100 mcg by mouth daily before breakfast.         . Linaclotide (LINZESS) 145 MCG CAPS capsule   Oral   Take 145 mcg by mouth daily.         [name] loratadine (CLARITIN) 10 MG tablet   Oral   Take 10 mg by mouth daily.         . Multiple Vitamin (MULTIVITAMIN) tablet   Oral   Take 1 tablet by mouth daily.         [name] omeprazole (PRILOSEC) 20 MG capsule   Oral   Take 20 mg by mouth daily.         [name] oxyCODONE (OXY IR/ROXICODONE) 5 MG immediate release tablet      LIMIT 2-4 tabs by mouth per day if tolerated   120 tablet   0   . potassium chloride (KLOR-CON) 20 MEQ packet   Oral   Take by mouth 2 (two) times daily.         [name]  rosuvastatin (CRESTOR) 20 MG tablet   Oral   Take 20 mg by mouth daily.         . sertraline (ZOLOFT) 100 MG tablet   Oral   Take 100 mg by mouth daily. i tab in the am and 0.5 tab at bedtime.         [name] thiothixene (NAVANE) 5 MG capsule   Oral   Take 5 mg by mouth 2 (two) times daily.         . traMADol (ULTRAM) 50 MG tablet   Oral   Take by mouth every 6 (six) hours as needed.         . traZODone (DESYREL) 50 MG tablet   Oral   Take 50 mg by mouth at bedtime.         [name] VITAMIN B1-B12 IJ   Injection   Inject as directed.          Allergies Penicillin g  Family History  Problem Relation Age of Onset  . Cancer Mother   . Depression Mother   . Diabetes Mother   . Hypertension Mother   . Heart disease Father    Social History Social History  Substance Use Topics  . Smoking status: Never Smoker   . Smokeless tobacco: None  . Alcohol Use: No   Review of Systems  Constitutional: Negative for fever. Eyes: Negative for visual changes. ENT: Negative for sore throat. Cardiovascular: Negative for chest  pain. Respiratory: Negative for shortness of breath. Gastrointestinal: Negative for abdominal pain, vomiting and diarrhea. Genitourinary: Negative for dysuria. Musculoskeletal: Negative for back pain. Left hand/wrist pain/deformity as above Skin: Negative for rash. Neurological: Negative for headaches, focal weakness or numbness. ____________________________________________  PHYSICAL EXAM:  VITAL SIGNS: ED Triage Vitals  Enc Vitals Group     BP 03/14/15 1410 172/92 mmHg     Pulse Rate 03/14/15 1410 76     Resp 03/14/15 1410 18     Temp 03/14/15 1410 98.1 F (36.7 C)     Temp Source 03/14/15 1410 Oral     SpO2 03/14/15 1410 100 %     Weight 03/14/15 1410 145 lb (65.772 kg)     Height 03/14/15 1410 5\' 7"  (1.702 m)     Head Cir --      Peak Flow --      Pain Score 03/14/15 1411 10     Pain Loc --      Pain Edu? --      Excl. in [place]? --    Constitutional: Alert and oriented. Well appearing and in no distress. Head: Normocephalic and atraumatic.      Eyes: Conjunctivae are normal. PERRL. Normal extraocular movements      Ears: Canals clear. TMs intact bilaterally.   Nose: No congestion/rhinorrhea.   Mouth/Throat: Mucous membranes are moist.   Neck: Supple. No thyromegaly. Hematological/Lymphatic/Immunological: No cervical lymphadenopathy. Cardiovascular: Normal rate, regular rhythm.  Respiratory: Normal respiratory effort. No wheezes/rales/rhonchi. Gastrointestinal: Soft and nontender. No distention. Musculoskeletal: Left hand and wrist with dorsal soft tissue swelling and ecchymosis at the wrist. Changes across the MCPs consistent with RA. Normal gross sensation distally. Nontender with normal range of motion in all extremities.  Neurologic:  Normal gait without ataxia. Normal speech and language. No gross focal neurologic deficits are appreciated. Skin:  Skin is warm, dry and intact. No rash noted. Psychiatric: Mood and affect are normal. Patient exhibits appropriate  insight and judgment. ____________________________________________   RADIOLOGY  Left Wrist IMPRESSION: Impacted  and dorsally angulated intra-articular fracture of the distal radius. Nondisplaced ulnar styloid fracture.  Left Wrist - Post-reduction IMPRESSION: Status post reduction of intraarticular distal radial fracture with improved dorsal angulation and persistent impaction.  I, Kolby Myung, Dannielle Karvonen, personally viewed and evaluated these images (plain radiographs) as part of my medical decision making.  ____________________________________________  PROCEDURES  Roxicet 5-325 mg PO ____________________________________________  INITIAL IMPRESSION / ASSESSMENT AND PLAN / ED COURSE  ----------------------------------------- 3:15 PM on 03/14/2015 ----------------------------------------- Dr. Jerline Pain (ortho) present at ED for closed reduction following local block. Orthoglass sugar-tong splint applied following post-reduction films. Patient will follow-up with Dr. Mack Guise next week.  ____________________________________________  FINAL CLINICAL IMPRESSION(S) / ED DIAGNOSES  Final diagnoses:  Hx of reduction of closed fracture  Wrist fracture, left, closed, initial encounter      Melvenia Needles, PA-C 03/14/15 1653  Orbie Pyo, MD 03/15/15 1524

## 2015-03-14 NOTE — Procedures (Signed)
PROCEDURE:  Patient was identified in the emergency room, timeout was performed. Under local anesthesia of lidocaine 1% 10 mL injected into the fracture site, a closed reduction of the left dorsally displaced distal radius was performed. The patient tolerated the procedure well. There is no complications. Post reduction x-rays showed appropriate fracture alignment.  Juliene Pina MD

## 2015-03-14 NOTE — Discharge Instructions (Signed)
Cast or Splint Care Casts and splints support injured limbs and keep bones from moving while they heal.  HOME CARE  Keep the cast or splint uncovered during the drying period.  A plaster cast can take 24 to 48 hours to dry.  A fiberglass cast will dry in less than 1 hour.  Do not rest the cast on anything harder than a pillow for 24 hours.  Do not put weight on your injured limb. Do not put pressure on the cast. Wait for your doctor's approval.  Keep the cast or splint dry.  Cover the cast or splint with a plastic bag during baths or wet weather.  If you have a cast over your chest and belly (trunk), take sponge baths until the cast is taken off.  If your cast gets wet, dry it with a towel or blow dryer. Use the cool setting on the blow dryer.  Keep your cast or splint clean. Wash a dirty cast with a damp cloth.  Do not put any objects under your cast or splint.  Do not scratch the skin under the cast with an object. If itching is a problem, use a blow dryer on a cool setting over the itchy area.  Do not trim or cut your cast.  Do not take out the padding from inside your cast.  Exercise your joints near the cast as told by your doctor.  Raise (elevate) your injured limb on 1 or 2 pillows for the first 1 to 3 days. GET HELP IF:  Your cast or splint cracks.  Your cast or splint is too tight or too loose.  You itch badly under the cast.  Your cast gets wet or has a soft spot.  You have a bad smell coming from the cast.  You get an object stuck under the cast.  Your skin around the cast becomes red or sore.  You have new or more pain after the cast is put on. GET HELP RIGHT AWAY IF:  You have fluid leaking through the cast.  You cannot move your fingers or toes.  Your fingers or toes turn blue or white or are cool, painful, or puffy (swollen).  You have tingling or lose feeling (numbness) around the injured area.  You have bad pain or pressure under the  cast.  You have trouble breathing or have shortness of breath.  You have chest pain.   This information is not intended to replace advice given to you by your health care provider. Make sure you discuss any questions you have with your health care provider.   Document Released: 07/07/2010 Document Revised: 11/07/2012 Document Reviewed: 09/13/2012 Elsevier Interactive Patient Education 2016 Elsevier Inc[employer]  Wrist Fracture A wrist fracture is a break or crack in one of the bones of your wrist. Your wrist is made up of eight small bones at the palm of your hand (carpal bones) and two long bones that make up your forearm (radius and ulna). CAUSES  A direct blow to the wrist.  Falling on an outstretched hand.  Trauma, such as a car accident or a fall. RISK FACTORS Risk factors for wrist fracture include:  Participating in contact and high-risk sports, such as skiing, biking, and ice skating.  Taking steroid medicines.  Smoking.  Being female.  Being Caucasian.  Drinking more than three alcoholic beverages per day.  Having low or lowered bone density (osteoporosis or osteopenia).  Age. Older adults have decreased bone density.  Women who have  had menopause.  History of previous fractures. SIGNS AND SYMPTOMS Symptoms of wrist fractures include tenderness, bruising, and inflammation. Additionally, the wrist may hang in an odd position or appear deformed. DIAGNOSIS Diagnosis may include:  Physical exam.  X-ray. TREATMENT Treatment depends on many factors, including the nature and location of the fracture, your age, and your activity level. Treatment for wrist fracture can be nonsurgical or surgical. Nonsurgical Treatment A plaster cast or splint may be applied to your wrist if the bone is in a good position. If the fracture is not in good position, it may be necessary for your health care provider to realign it before applying a splint or cast. Usually, a cast or splint  will be worn for several weeks. Surgical Treatment Sometimes the position of the bone is so far out of place that surgery is required to apply a device to hold it together as it heals. Depending on the fracture, there are a number of options for holding the bone in place while it heals, such as a cast and metal pins. HOME CARE INSTRUCTIONS  Keep your injured wrist elevated and move your fingers as much as possible.  Do not put pressure on any part of your cast or splint. It may break.  Use a plastic bag to protect your cast or splint from water while bathing or showering. Do not lower your cast or splint into water.  Take medicines only as directed by your health care provider.  Keep your cast or splint clean and dry. If it becomes wet, damaged, or suddenly feels too tight, contact your health care provider right away.  Do not use any tobacco products including cigarettes, chewing tobacco, or electronic cigarettes. Tobacco can delay bone healing. If you need help quitting, ask your health care provider.  Keep all follow-up visits as directed by your health care provider. This is important.  Ask your health care provider if you should take supplements of calcium and vitamins C and D to promote bone healing. SEEK MEDICAL CARE IF:  Your cast or splint is damaged, breaks, or gets wet.  You have a fever.  You have chills.  You have continued severe pain or more swelling than you did before the cast was put on. SEEK IMMEDIATE MEDICAL CARE IF:  Your hand or fingernails on the injured arm turn blue or gray, or feel cold or numb.  You have decreased feeling in the fingers of your injured arm. MAKE SURE YOU:  Understand these instructions.  Will watch your condition.  Will get help right away if you are not doing well or get worse.   This information is not intended to replace advice given to you by your health care provider. Make sure you discuss any questions you have with your  health care provider.   Document Released: 12/15/2004 Document Revised: 11/26/2014 Document Reviewed: 03/25/2011 Elsevier Interactive Patient Education 2016 [place] your daily narcotic pain medicine as previously directed. Apply ice through the splint for swelling. Use the sling when out of bed. Rest with the arm resting above the waist to reduce swelling.

## 2015-03-14 NOTE — ED Notes (Signed)
Pt states she tripped and landed on left wrist, states she also hit the side of her face but denies any LOC, obvious deformity to left wrist

## 2015-03-14 NOTE — Consult Note (Signed)
ORTHOPAEDIC CONSULTATION  PATIENT NAME: Kristi [name] DOB: November 12, 1940  MRN: [id number]  REQUESTING PHYSICIAN: Orbie Pyo,*  Chief Complaint: Left wrist pain s/p Fall  HPI: Kristi [name] is a 74 y.o. female who complains of  left wrist pain after sustaining a fall with an outstretched arm. She felt immediate pain and deformity in the left wrist. She was then brought to the emergency department for formal evaluation. She notes she has never broken that wrist before. She has never broken her right wrist either.  Patient is right-hand dominant and does not work at this time. She is retired and lives at home alone.  She states she does have numbness and tingling in all of her fingers that is not worsening at this time. This is in the setting of her fracture that is not reduced.  She denies any other pain elsewhere. She does state recently she had a clavicle fracture.  Past Medical History  Diagnosis Date  . Hyperlipidemia   . Spinal stenosis   . Anxiety   . Myocardial infarction ([place])   . Depression   . Hypothyroid   . Hypertension    Past Surgical History  Procedure Laterality Date  . Abdominal hysterectomy    . Coronary stent placement    . Coronary artery bypass graft    . S/p pacer insertion     Social History   Social History  . Marital Status: Widowed    Spouse Name: N/A  . Number of Children: N/A  . Years of Education: N/A   Social History Main Topics  . Smoking status: Never Smoker   . Smokeless tobacco: None  . Alcohol Use: No  . Drug Use: No  . Sexual Activity: Not Asked   Other Topics Concern  . None   Social History Narrative   Family History  Problem Relation Age of Onset  . Cancer Mother   . Depression Mother   . Diabetes Mother   . Hypertension Mother   . Heart disease Father    Allergies  Allergen Reactions  . Penicillin G Rash   Prior to Admission medications   Medication Sig Start Date End Date Taking? Authorizing  Provider  ALPRAZolam Duanne Moron) 0.5 MG tablet Take 0.5 mg by mouth at bedtime as needed for anxiety.    Historical Provider, MD  cholecalciferol (VITAMIN D) 1000 UNITS tablet Take 1,000 Units by mouth daily.    Historical Provider, MD  ciprofloxacin (CIPRO) 250 MG tablet Take 1 tablet (250 mg total) by mouth 2 (two) times daily. Patient not taking: Reported on 03/10/2015 02/09/15   Mohammed Kindle, MD  docusate sodium (COLACE) 100 MG capsule Take 100 mg by mouth 2 (two) times daily.    Historical Provider, MD  furosemide (LASIX) 20 MG tablet Take 20 mg by mouth.    Historical Provider, MD  levothyroxine (SYNTHROID, LEVOTHROID) 100 MCG tablet Take 100 mcg by mouth daily before breakfast.    Historical Provider, MD  Linaclotide (LINZESS) 145 MCG CAPS capsule Take 145 mcg by mouth daily.    Historical Provider, MD  loratadine (CLARITIN) 10 MG tablet Take 10 mg by mouth daily.    Historical Provider, MD  Multiple Vitamin (MULTIVITAMIN) tablet Take 1 tablet by mouth daily.    Historical Provider, MD  omeprazole (PRILOSEC) 20 MG capsule Take 20 mg by mouth daily.    Historical Provider, MD  oxyCODONE (OXY IR/ROXICODONE) 5 MG immediate release tablet LIMIT 2-4 tabs by mouth per day if tolerated 03/10/15  Mohammed Kindle, MD  potassium chloride (KLOR-CON) 20 MEQ packet Take by mouth 2 (two) times daily.    Historical Provider, MD  rosuvastatin (CRESTOR) 20 MG tablet Take 20 mg by mouth daily.    Historical Provider, MD  sertraline (ZOLOFT) 100 MG tablet Take 100 mg by mouth daily. i tab in the am and 0.5 tab at bedtime.    Historical Provider, MD  thiothixene (NAVANE) 5 MG capsule Take 5 mg by mouth 2 (two) times daily.    Historical Provider, MD  traMADol (ULTRAM) 50 MG tablet Take by mouth every 6 (six) hours as needed.    Historical Provider, MD  traZODone (DESYREL) 50 MG tablet Take 50 mg by mouth at bedtime.    Historical Provider, MD  VITAMIN B1-B12 IJ Inject as directed.    Historical Provider, MD   Dg  Wrist Complete Left  03/14/2015  CLINICAL DATA:  Wrist pain and swelling after falling today. Initial encounter. EXAM: LEFT WRIST - COMPLETE 3+ VIEW COMPARISON:  None. FINDINGS: The bones appear mildly demineralized. There is an acute impacted fracture of the distal radius with associated dorsal angulation. This fracture demonstrates probable intra-articular extension. There is a nondisplaced ulnar styloid fracture. The carpal bones appear intact. Degenerative changes are present at first [facility] articulation. IMPRESSION: Impacted and dorsally angulated intra-articular fracture of the distal radius. Nondisplaced ulnar styloid fracture. Electronically Signed   By: Richardean Sale M.D.   On: 03/14/2015 15:12    Positive ROS: All other systems have been reviewed and were otherwise negative with the exception of those mentioned in the HPI and as above.  Physical Exam: General: Alert and alert in no acute distress. HEENT: Atraumatic and normocephalic. Sclera are clear. Extraocular motion is intact. Oropharynx is clear with moist mucosa. Neck: Supple, nontender, good range of motion. No JVD or carotid bruits. Lungs: Clear to auscultation bilaterally. Cardiovascular: Regular rate and rhythm with normal S1 and S2. No murmurs. No gallops or rubs. Pedal pulses are palpable bilaterally. Homans test is negative bilaterally. No significant pretibial or ankle edema. Abdomen: Soft, nontender, and nondistended. Bowel sounds are present. Skin: No lesions in the area of chief complaint Neurologic: Awake, alert, and oriented. Sensory function is grossly intact. Motor strength is felt to be 5 over 5 bilaterally. No clonus or tremor. Good motor coordination. Lymphatic: No axillary or cervical lymphadenopathy  MUSCULOSKELETAL:   Obvious deformity of the left wrist with pain on palpation and crepitus noted. No focal nerve deficits consistent with carpal tunnel syndrome. She does have some tingling in all of her digits  this is likely secondary to the deformity in her wrist. Capillary refill is less than 2 seconds No other deformities in the left upper extremity  No other noted orthopedic deformities  Assessment: 74 year old right hand dominant female with left closed displaced dorsally angulated distal radius fracture  Plan: A reduction maneuver with local block was performed in the emergency department I discussed the overall plan with the patient including follow-up in the next week with repeat x-rays to evaluate for fracture displacement. The patient will keep her hand elevated to decrease swelling in the area. Pain Medication per ED   Juliene Pina MD

## 2015-03-30 ENCOUNTER — Ambulatory Visit: Payer: Medicare Other | Admitting: Pain Medicine

## 2015-04-08 ENCOUNTER — Encounter: Payer: Self-pay | Admitting: Pain Medicine

## 2015-04-08 ENCOUNTER — Ambulatory Visit: Payer: Medicare Other | Attending: Pain Medicine | Admitting: Pain Medicine

## 2015-04-08 VITALS — BP 140/69 | HR 61 | Temp 98.5°F | Resp 16 | Wt 145.0 lb

## 2015-04-08 DIAGNOSIS — M5136 Other intervertebral disc degeneration, lumbar region: Secondary | ICD-10-CM

## 2015-04-08 DIAGNOSIS — M47816 Spondylosis without myelopathy or radiculopathy, lumbar region: Secondary | ICD-10-CM

## 2015-04-08 DIAGNOSIS — M4806 Spinal stenosis, lumbar region: Secondary | ICD-10-CM | POA: Insufficient documentation

## 2015-04-08 DIAGNOSIS — M79606 Pain in leg, unspecified: Secondary | ICD-10-CM | POA: Diagnosis present

## 2015-04-08 DIAGNOSIS — M533 Sacrococcygeal disorders, not elsewhere classified: Secondary | ICD-10-CM

## 2015-04-08 DIAGNOSIS — M545 Low back pain: Secondary | ICD-10-CM | POA: Diagnosis present

## 2015-04-08 DIAGNOSIS — M8580 Other specified disorders of bone density and structure, unspecified site: Secondary | ICD-10-CM | POA: Insufficient documentation

## 2015-04-08 DIAGNOSIS — M4186 Other forms of scoliosis, lumbar region: Secondary | ICD-10-CM | POA: Diagnosis not present

## 2015-04-08 DIAGNOSIS — M48061 Spinal stenosis, lumbar region without neurogenic claudication: Secondary | ICD-10-CM

## 2015-04-08 DIAGNOSIS — M48062 Spinal stenosis, lumbar region with neurogenic claudication: Secondary | ICD-10-CM

## 2015-04-08 MED ORDER — SODIUM CHLORIDE 0.9 % IJ SOLN
INTRAMUSCULAR | Status: AC
  Administered 2015-04-08: 4 mL
  Filled 2015-04-08: qty 20

## 2015-04-08 MED ORDER — LIDOCAINE HCL (PF) 1 % IJ SOLN
INTRAMUSCULAR | Status: AC
  Administered 2015-04-08: 5 mL via SUBCUTANEOUS
  Filled 2015-04-08: qty 5

## 2015-04-08 MED ORDER — CIPROFLOXACIN IN D5W 400 MG/200ML IV SOLN
400.0000 mg | Freq: Once | INTRAVENOUS | Status: AC
  Administered 2015-04-08: 400 mg via INTRAVENOUS

## 2015-04-08 MED ORDER — MIDAZOLAM HCL 5 MG/5ML IJ SOLN
INTRAMUSCULAR | Status: AC
  Administered 2015-04-08: 2 mg via INTRAVENOUS
  Filled 2015-04-08: qty 5

## 2015-04-08 MED ORDER — SODIUM CHLORIDE 0.9 % IJ SOLN
20.0000 mL | Freq: Once | INTRAMUSCULAR | Status: AC
  Administered 2015-04-08: 4 mL

## 2015-04-08 MED ORDER — LIDOCAINE HCL (PF) 1 % IJ SOLN
10.0000 mL | Freq: Once | INTRAMUSCULAR | Status: AC
  Administered 2015-04-08: 5 mL via SUBCUTANEOUS

## 2015-04-08 MED ORDER — ORPHENADRINE CITRATE 30 MG/ML IJ SOLN
INTRAMUSCULAR | Status: AC
  Filled 2015-04-08: qty 2

## 2015-04-08 MED ORDER — FENTANYL CITRATE (PF) 100 MCG/2ML IJ SOLN
INTRAMUSCULAR | Status: AC
  Administered 2015-04-08: 50 ug via INTRAVENOUS
  Filled 2015-04-08: qty 2

## 2015-04-08 MED ORDER — ORPHENADRINE CITRATE 30 MG/ML IJ SOLN: 60.0000 mg | Freq: Once | INTRAMUSCULAR | Status: DC

## 2015-04-08 MED ORDER — CIPROFLOXACIN HCL 250 MG PO TABS: 250.0000 mg | ORAL_TABLET | Freq: Two times a day (BID) | ORAL | Status: DC

## 2015-04-08 MED ORDER — MIDAZOLAM HCL 5 MG/5ML IJ SOLN
5.0000 mg | Freq: Once | INTRAMUSCULAR | Status: AC
  Administered 2015-04-08: 2 mg via INTRAVENOUS

## 2015-04-08 MED ORDER — TRIAMCINOLONE ACETONIDE 40 MG/ML IJ SUSP
INTRAMUSCULAR | Status: AC
  Administered 2015-04-08: 40 mg
  Filled 2015-04-08: qty 1

## 2015-04-08 MED ORDER — BUPIVACAINE HCL (PF) 0.25 % IJ SOLN
INTRAMUSCULAR | Status: AC
  Filled 2015-04-08: qty 30

## 2015-04-08 MED ORDER — BUPIVACAINE HCL (PF) 0.25 % IJ SOLN: 30.0000 mL | Freq: Once | INTRAMUSCULAR | Status: DC

## 2015-04-08 MED ORDER — TRIAMCINOLONE ACETONIDE 40 MG/ML IJ SUSP
40.0000 mg | Freq: Once | INTRAMUSCULAR | Status: AC
  Administered 2015-04-08: 40 mg

## 2015-04-08 MED ORDER — CIPROFLOXACIN IN D5W 400 MG/200ML IV SOLN
INTRAVENOUS | Status: AC
  Administered 2015-04-08: 400 mg via INTRAVENOUS
  Filled 2015-04-08: qty 200

## 2015-04-08 MED ORDER — FENTANYL CITRATE (PF) 100 MCG/2ML IJ SOLN
100.0000 ug | Freq: Once | INTRAMUSCULAR | Status: AC
  Administered 2015-04-08: 50 ug via INTRAVENOUS

## 2015-04-08 MED ORDER — LACTATED RINGERS IV SOLN: 1000.0000 mL | INTRAVENOUS | Status: DC

## 2015-04-08 MED ORDER — OXYCODONE HCL 5 MG PO TABS: ORAL_TABLET | ORAL | Status: DC

## 2015-04-08 NOTE — Progress Notes (Signed)
   Subjective:    Patient ID: Kristi [name], female    DOB: [birth year], 75 y.o.   MRN: [id number]  HPI  PROCEDURE PERFORMED: Lumbar epidural steroid injection   NOTE: The patient is a 75 y.o. female who returns to [place] for further evaluation and treatment of pain involving the lumbar and lower extremity region. MRI revealed the patient to be with Degenerative disc disease lumbar spine Degenerative changes L2-L3 L3-L4 with disc bulging and L3-4 facet arthropathy, severe spinal canal stenosis L4-L5 disc bulging and multilevel facet arthropathy, osteopenia, scoliosis, severe spinal canal stenosis L4-L5 disc bulging and arthropathy, retrolisthesis on the right L2-3 and L3-4 there is concern regarding intraspinal abnormalities contributing to lumbar radiculopathy and lumbar stenosis with neurogenic claudication. The risks, benefits, and expectations of the procedure have been discussed and explained to the patient who was understanding and in agreement with suggested treatment plan. We will proceed with lumbar epidural steroid injection as discussed and as explained to the patient who is willing to proceed with procedure as planned.   DESCRIPTION OF PROCEDURE: Lumbar epidural steroid injection with IV Versed, IV fentanyl conscious sedation, EKG, blood pressure, pulse, and pulse oximetry monitoring. The procedure was performed with the patient in the prone position under fluoroscopic guidance. A local anesthetic skin wheal of 1.5% plain lidocaine was accomplished at proposed entry site. An 18-gauge Tuohy epidural needle was inserted at the L 4 vertebral body level right of the midline via loss-of-resistance technique with negative heme and negative CSF return. A total of 4 mL of Preservative-Free normal saline with 40 mg of Kenalog injected incrementally via epidurally placed needle. Needle was removed.    A total of 40 mg of Kenalog was utilized for the procedure.   The patient  tolerated the injection well.    PLAN:   1. Medications: We will continue presently prescribed medication oxycodone 2. Will consider modification of treatment regimen pending response to treatment rendered on today's visit and follow-up evaluation. 3. The patient is to follow-up with primary care physician Dr. Brunetta Genera regarding blood pressure and general medical condition status post lumbar epidural steroid injection performed on today's visit. The patient is to undergo evaluation this week as discussed as well as appointment with Dr. Lavera Guise this week for cardiac evaluation as discussed. Patient is totally asymptomatic and chest pain, shortness of breath, headache, dizziness, fatigue, dyspnea on exertion, nausea, vomiting, or other symptoms 4. Surgical evaluation. Patient prefers to avoid 5. Neurological evaluation. Has been addressed 6. The patient may be a candidate for radiofrequency procedures, implantation device, and other treatment pending response to treatment and follow-up evaluation. 7. The patient has been advised to adhere to proper body mechanics and avoid activities which appear to aggravate condition. 8. The patient has been advised to call the [facility] prior to scheduled return appointment should there be significant change in condition or should there be sign  The patient is understanding and agrees with the suggested  treatment plan   Review of Systems     Objective:   Physical Exam        Assessment & Plan:

## 2015-04-08 NOTE — Patient Instructions (Addendum)
PLAN  Continue present medication Cipro and begin taking antibiotic as prescribed. Please obtain your antibiotic Cipro today  and begin taking antibiotic today  F/U PCP Dr Brunetta Genera for evaliation of  BP and general medical  condition. Please see Dr. Brunetta Genera this week for evaluation as discussed and follow-up with Dr Lavera Guise this week as well  F/U evaluation with Dr. Lavera Guise this week as discussed  F/U surgical evaluation. May consider pending follow-up evaluations  F/U neurological evaluation. May consider pending follow-up evaluations  May consider radiofrequency rhizolysis or intraspinal procedures pending response to present treatment and F/U evaluation.  Patient to call Pain Management Center should patient have concerns prior to scheduled return appointment. Epidural Steroid Injection Patient Information  Description: The epidural space surrounds the nerves as they exit the spinal cord.  In some patients, the nerves can be compressed and inflamed by a bulging disc or a tight spinal canal (spinal stenosis).  By injecting steroids into the epidural space, we can bring irritated nerves into direct contact with a potentially helpful medication.  These steroids act directly on the irritated nerves and can reduce swelling and inflammation which often leads to decreased pain.  Epidural steroids may be injected anywhere along the spine and from the neck to the low back depending upon the location of your pain.   After numbing the skin with local anesthetic (like Novocaine), a small needle is passed into the epidural space slowly.  You may experience a sensation of pressure while this is being done.  The entire block usually last less than 10 minutes.  Conditions which may be treated by epidural steroids:   Low back and leg pain  Neck and arm pain  Spinal stenosis  Post-laminectomy syndrome  Herpes zoster (shingles) pain  Pain from compression fractures  Preparation for the  injection:  1. Do not eat any solid food or dairy products within 6 hours of your appointment.  2. You may drink clear liquids up to 2 hours before appointment.  Clear liquids include water, black coffee, juice or soda.  No milk or cream please. 3. You may take your regular medication, including pain medications, with a sip of water before your appointment  Diabetics should hold regular insulin (if taken separately) and take 1/2 normal NPH dos the morning of the procedure.  Carry some sugar containing items with you to your appointment. 4. A driver must accompany you and be prepared to drive you home after your procedure.  5. Bring all your current medications with your. 6. An IV may be inserted and sedation may be given at the discretion of the physician.   7. A blood pressure cuff, EKG and other monitors will often be applied during the procedure.  Some patients may need to have extra oxygen administered for a short period. 8. You will be asked to provide medical information, including your allergies, prior to the procedure.  We must know immediately if you are taking blood thinners (like Coumadin/Warfarin)  Or if you are allergic to IV iodine contrast (dye). We must know if you could possible be pregnant.  Possible side-effects:  Bleeding from needle site  Infection (rare, may require surgery)  Nerve injury (rare)  Numbness & tingling (temporary)  Difficulty urinating (rare, temporary)  Spinal headache ( a headache worse with upright posture)  Light -headedness (temporary)  Pain at injection site (several days)  Decreased blood pressure (temporary)  Weakness in arm/leg (temporary)  Pressure sensation in back/neck (temporary)  Call if you  experience:  Fever/chills associated with headache or increased back/neck pain.  Headache worsened by an upright position.  New onset weakness or numbness of an extremity below the injection site  Hives or difficulty breathing (go to the  emergency room)  Inflammation or drainage at the infection site  Severe back/neck pain  Any new symptoms which are concerning to you  Please note:  Although the local anesthetic injected can often make your back or neck feel good for several hours after the injection, the pain will likely return.  It takes 3-7 days for steroids to work in the epidural space.  You may not notice any pain relief for at least that one week.  If effective, we will often do a series of three injections spaced 3-6 weeks apart to maximally decrease your pain.  After the initial series, we generally will wait several months before considering a repeat injection of the same type.  If you have any questions, please call [phone] [facility] Dis[facility] Inst[facility]  General Discharge Instructions :  If you need to reach your doctor call: Monday-Friday 8:00 am - 4:00 pm at [phone] or toll free [phone].  After clinic hours [phone] to have operator reach doctor.  Bring all of your medication bottles to all your appointments in the pain clinic.  To cancel or reschedule your appointment with Pain Management please remember to call 24 hours in advance to avoid a fee.  Refer to the educational materials which you have been given on: General Risks, I had my Procedure. Discharge Instructions, Post Sedation.  Post Procedure Instructions:  The drugs you were given will stay in your system until tomorrow, so for the next 24 hours you should not drive, make any legal decisions or drink any alcoholic beverages.  You may eat anything you prefer, but it is better to start with liquids then soups and crackers, and gradually work up to solid foods.  Please notify your doctor immediately if you have any unusual bleeding, trouble breathing or pain that is not related to your normal pain.  Depending on the type of procedure that was done, some parts of your  body may feel week and/or numb.  This usually clears up by tonight or the next day.  Walk with the use of an assistive device or accompanied by an adult for the 24 hours.  You may use ice on the affected area for the first 24 hours.  Put ice in a Ziploc bag and cover with a towel and place against area 15 minutes on 15 minutes off.  You may switch to heat after 24 hours.

## 2015-04-08 NOTE — Progress Notes (Signed)
Safety precautions to be maintained throughout the outpatient stay will include: orient to surroundings, keep bed in low position, maintain call bell within reach at all times, provide assistance with transfer out of bed and ambulation.  

## 2015-05-07 ENCOUNTER — Encounter: Payer: Self-pay | Admitting: Pain Medicine

## 2015-05-07 ENCOUNTER — Ambulatory Visit: Payer: Medicare Other | Attending: Pain Medicine | Admitting: Pain Medicine

## 2015-05-07 VITALS — BP 131/79 | HR 80 | Temp 98.1°F | Resp 16 | Ht 67.0 in | Wt 140.0 lb

## 2015-05-07 DIAGNOSIS — M533 Sacrococcygeal disorders, not elsewhere classified: Secondary | ICD-10-CM

## 2015-05-07 DIAGNOSIS — M47816 Spondylosis without myelopathy or radiculopathy, lumbar region: Secondary | ICD-10-CM | POA: Diagnosis not present

## 2015-05-07 DIAGNOSIS — M4316 Spondylolisthesis, lumbar region: Secondary | ICD-10-CM | POA: Insufficient documentation

## 2015-05-07 DIAGNOSIS — M48062 Spinal stenosis, lumbar region with neurogenic claudication: Secondary | ICD-10-CM

## 2015-05-07 DIAGNOSIS — M4806 Spinal stenosis, lumbar region: Secondary | ICD-10-CM | POA: Insufficient documentation

## 2015-05-07 DIAGNOSIS — M79606 Pain in leg, unspecified: Secondary | ICD-10-CM | POA: Diagnosis present

## 2015-05-07 DIAGNOSIS — M8580 Other specified disorders of bone density and structure, unspecified site: Secondary | ICD-10-CM | POA: Diagnosis not present

## 2015-05-07 DIAGNOSIS — M5136 Other intervertebral disc degeneration, lumbar region: Secondary | ICD-10-CM | POA: Diagnosis not present

## 2015-05-07 DIAGNOSIS — M5416 Radiculopathy, lumbar region: Secondary | ICD-10-CM

## 2015-05-07 DIAGNOSIS — M5126 Other intervertebral disc displacement, lumbar region: Secondary | ICD-10-CM | POA: Diagnosis not present

## 2015-05-07 DIAGNOSIS — M51369 Other intervertebral disc degeneration, lumbar region without mention of lumbar back pain or lower extremity pain: Secondary | ICD-10-CM

## 2015-05-07 DIAGNOSIS — M48061 Spinal stenosis, lumbar region without neurogenic claudication: Secondary | ICD-10-CM

## 2015-05-07 DIAGNOSIS — M545 Low back pain: Secondary | ICD-10-CM | POA: Diagnosis present

## 2015-05-07 MED ORDER — OXYCODONE HCL 5 MG PO TABS: ORAL_TABLET | ORAL | Status: DC

## 2015-05-07 NOTE — Patient Instructions (Addendum)
PLAN   Continue present medication oxycodone  Lumbosacral selective nerve root block to be performed at time of return appointment  F/U PCP Dr. Brunetta Genera for evaliation of  BP and general medical  condition  F/U surgical evaluation. May consider pending follow-up evaluations. The patient is with prior surgical evaluation without plans for surgical intervention . The patient will follow-up with Dr. Mack Guise for follow-up evaluation and treatment of fracture of the left upper extremity  F/U neurological evaluation. May consider pending follow-up evaluations. May consider additional studies . At the present time we will avoid PNCV/EMG studies and other studies  May consider radiofrequency rhizolysis or intraspinal procedures pending response to present treatment and F/U evaluation   Patient to call Pain Management Center should patient have concerns prior to scheduled return appointment.Selective Nerve Root Block Patient Information  Description: Specific nerve roots exit the spinal canal and these nerves can be compressed and inflamed by a bulging disc and bone spurs.  By injecting steroids on the nerve root, we can potentially decrease the inflammation surrounding these nerves, which often leads to decreased pain.  Also, by injecting local anesthesia on the nerve root, this can provide [place] helpful information to give to your referring doctor if it decreases your pain.  Selective nerve root blocks can be done along the spine from the neck to the low back depending on the location of your pain.   After numbing the skin with local anesthesia, a small needle is passed to the nerve root and the position of the needle is verified using x-ray pictures.  After the needle is in correct position, we then deposit the medication.  You may experience a pressure sensation while this is being done.  The entire block usually lasts less than 15 minutes.  Conditions that may be treated with selective nerve root  blocks:  Low back and leg pain  Spinal stenosis  Diagnostic block prior to potential surgery  Neck and arm pain  Post laminectomy syndrome  Preparation for the injection:  1. Do not eat any solid food or dairy products within 6 hours of your appointment. 2. You may drink clear liquids up to 2 hours before an appointment.  Clear liquids include water, black coffee, juice or soda.  No milk or cream please. 3. You may take your regular medications, including pain medications, with a sip of water before your appointment.  Diabetics should hold regular insulin (if taken separately) and take 1/2 normal NPH dose the morning of the procedure.  Carry some sugar containing items with you to your appointment. 4. A driver must accompany you and be prepared to drive you home after your procedure. 5. Bring all your current medications with you. 6. An IV may be inserted and sedation may be given at the discretion of the physician. 7. A blood pressure cuff, EKG, and other monitors will often be applied during the procedure.  Some patients may need to have extra oxygen administered for a short period. 8. You will be asked to provide medical information, including allergies, prior to the procedure.  We must know immediately if you are taking blood  Thinners (like Coumadin) or if you are allergic to IV iodine contrast (dye).  Possible side-effects: All are usually temporary  Bleeding from needle site  Light headedness  Numbness and tingling  Decreased blood pressure  Weakness in arms/legs  Pressure sensation in back/neck  Pain at injection site (several days)  Possible complications: All are extremely rare  Infection  Nerve injury  Spinal headache (a headache wore with upright position)  Call if you experience:  Fever/chills associated with headache or increased back/neck pain  Headache worsened by an upright position  New onset weakness or numbness of an extremity below the  injection site  Hives or difficulty breathing (go to the emergency room)  Inflammation or drainage at the injection site(s)  Severe back/neck pain greater than usual  New symptoms which are concerning to you  Please note:  Although the local anesthetic injected can often make your back or neck feel good for several hours after the injection the pain will likely return.  It takes 3-5 days for steroids to work on the nerve root. You may not notice any pain relief for at least one week.  If effective, we will often do a series of 3 injections spaced 3-6 weeks apart to maximally decrease your pain.    If you have any questions, please call [phone] [facility]

## 2015-05-07 NOTE — Progress Notes (Signed)
Safety precautions to be maintained throughout the outpatient stay will include: orient to surroundings, keep bed in low position, maintain call bell within reach at all times, provide assistance with transfer out of bed and ambulation.  

## 2015-05-07 NOTE — Progress Notes (Signed)
Subjective:    Patient ID: Kristi [name], female    DOB: 05-Nov-1940, 75 y.o.   MRN: [id number]  HPI The patient is a 75 year old female who returns to pain management for further evaluation and treatment of pain involving the lower back and lower extremity regions predominantly the right lower extremity. The patient states that she has pain aggravated by standing walking with pain becoming more intense as patient spends time on the feet. The patient denies any trauma change in events of daily living the call significant change in symptomatology. We discussed patient's condition and will continue oxycodone and we'll schedule patient for lumbosacral selective nerve root block to be performed at time of return appointment in attempt to decrease severity of symptoms, minimize progression of symptoms, and avoid the need for more involved treatment. The patient agreed to suggested treatment plan.   Review of Systems     Objective:   Physical Exam  There was tends to palpation of the splenius capitis and occipitalis musculature regions palpation which be produced pain of minimal degree with minimal tenderness over the cervical facet cervical paraspinal musculature region and minimal tenderness of the thoracic facet thoracic paraspinal musculature region. Palpation of the acromioclavicular and glenohumeral joint regions reproduced pain of mild degree and patient appeared to be with decreased grip strength of the left upper extremity compared to the right upper extremity. Patient is status post fracture of the left upper extremity. There was tends to palpation at the wrist of the left upper extremity without increased warmth and erythema. The patient has significant increased pain with attempted Tinel and Phalen's maneuver of the left upper extremity. Palpation of the thoracic facet thoracic paraspinal musculature region was attends to palpation of mild degree with no crepitus of the thoracic region noted.  Palpation over the lumbar paraspinal must reason lumbar facet region was attends to palpation of moderately severe degree with lateral bending rotation extension and palpation lumbar facets reproducing moderately severe discomfort. Straight leg raising was tolerates approximately 20 without increased pain with dorsiflexion noted with negative clonus negative Homans. No definite sensory deficit or dermatomal dystrophy detected. EHL strength appeared to be decreased. There was mild tenderness of the PSIS and PII S region a mild tenderness of the greater trochanteric region and iliotibial band region.      Assessment & Plan:       Degenerative disc disease lumbar spine Degenerative changes L2-L3 L3-L4 with disc bulging and L3-4 facet arthropathy, severe spinal canal stenosis L4-L5 disc bulging and multilevel facet arthropathy, osteopenia, scoliosis, severe spinal canal stenosis L4-L5 disc bulging and arthropathy, retrolisthesis on the right L2-3 and L3-4  Lumbar facet syndrome  Spinal stenosis with neurogenic claudication  Sacroiliac joint dysfunction      PLAN   Continue present medication oxycodone  Lumbosacral selective nerve root block to be performed at time of return appointment  F/U PCP Dr. Brunetta Genera for evaliation of  BP and general medical  condition  F/U surgical evaluation. May consider pending follow-up evaluations. The patient is with prior surgical evaluation without plans for surgical intervention . The patient will follow-up with Dr. Mack Guise for follow-up evaluation and treatment of fracture of the left upper extremity  F/U neurological evaluation. May consider pending follow-up evaluations. May consider additional studies . At the present time we will avoid PNCV/EMG studies and other studies  May consider radiofrequency rhizolysis or intraspinal procedures pending response to present treatment and F/U evaluation   Patient to call Pain Management Center should  patient have concerns prior to scheduled return appointment.

## 2015-05-13 ENCOUNTER — Ambulatory Visit: Payer: Medicare Other | Attending: Orthopedic Surgery | Admitting: Occupational Therapy

## 2015-05-13 DIAGNOSIS — M25632 Stiffness of left wrist, not elsewhere classified: Secondary | ICD-10-CM

## 2015-05-13 DIAGNOSIS — M79632 Pain in left forearm: Secondary | ICD-10-CM | POA: Diagnosis not present

## 2015-05-13 DIAGNOSIS — M6281 Muscle weakness (generalized): Secondary | ICD-10-CM

## 2015-05-13 NOTE — Patient Instructions (Signed)
PROM for wrist in all planes -  AROM in all planes  Light blue putty for grip , 2 point grip alternated digits

## 2015-05-13 NOTE — Therapy (Signed)
[facility] [address], [place], [address] Phone: [phone]   Fax:  [phone]  Occupational Therapy Treatment  Patient Details  Name: Kristi [name] MRN: [id number] Date of Birth: [birth year] Referring Provider: Mack Guise  Encounter Date: 05/13/2015      OT End of Session - 05/13/15 1719    Visit Number 1   Number of Visits 12   Date for OT Re-Evaluation 06/24/15   OT Start Time 1226   OT Stop Time 1328   OT Time Calculation (min) 62 min   Activity Tolerance Patient tolerated treatment well   Behavior During Therapy [facility] for tasks assessed/performed      Past Medical History  Diagnosis Date  . Hyperlipidemia   . Spinal stenosis   . Anxiety   . Myocardial infarction ([place])   . Depression   . Hypothyroid   . Hypertension     Past Surgical History  Procedure Laterality Date  . Abdominal hysterectomy    . Coronary stent placement    . Coronary artery bypass graft    . S/p pacer insertion      There were no vitals filed for this visit.  Visit Diagnosis:  Pain of left forearm - Plan: Ot plan of care cert/re-cert  Stiffness of left wrist joint - Plan: Ot plan of care cert/re-cert  Muscle weakness - Plan: Ot plan of care cert/re-cert      Subjective Assessment - 05/13/15 1250    Subjective  Fell the day before Christmas and was in cast for 6 wks - pain mostly - was in splint for 2 wks and he took me out of it last time    Patient Stated Goals I want to get the pain better, move and use it more - like lifting things, hold book , squeeze washcloth, bath, open jars ,cutting , driving and open car door   Currently in Pain? Yes   Pain Score 5    Pain Location Wrist   Pain Orientation Left   Pain Descriptors / Indicators Aching   Pain Onset More than a month ago            [facility] OT Assessment - 05/13/15 0001    Assessment   Diagnosis L Wrist colles fracture - close    Referring  Provider Mack Guise   Onset Date 03/14/15   Balance Screen   Has the patient fallen in the past 6 months Yes   How many times? 5   Has the patient had a decrease in activity level because of a fear of falling?  Yes   Is the patient reluctant to leave their home because of a fear of falling?  No   Home  Environment   Lives With Alone   Prior Function   Level of Independence Independent with basic ADLs   Leisure R hand dominant - lieks to read, watch tv,  light house work , daughter and grand daughter does most of housework,    AROM   Right Forearm Pronation 90 Degrees   Right Forearm Supination 90 Degrees   Left Forearm Pronation 90 Degrees   Left Forearm Supination 80 Degrees   Right Wrist Extension 60 Degrees   Right Wrist Flexion 98 Degrees   Right Wrist Radial Deviation 22 Degrees   Right Wrist Ulnar Deviation 42 Degrees   Left Wrist Extension 37 Degrees   Left Wrist Flexion 45 Degrees   Left Wrist Radial Deviation 18 Degrees  Left Wrist Ulnar Deviation 20 Degrees   Strength   Right Hand Grip (lbs) 25   Right Hand Lateral Pinch 13 lbs   Right Hand 3 Point Pinch 12 lbs   Left Hand Grip (lbs) 5   Left Hand Lateral Pinch 7 lbs   Left Hand 3 Point Pinch 4 lbs       Fluidotherapy done to L hand and wrist prior to review of HEP - pain decrease to 2/10 and to increase motion                    OT Education - June 07, 2015 1553    Education provided Yes   Education Details HEP   Person(s) Educated Patient   Methods Explanation;Demonstration;Tactile cues;Verbal cues;Handout   Comprehension Verbal cues required;Returned demonstration;Verbalized understanding          OT Short Term Goals - 06/07/2015 1724    OT SHORT TERM GOAL #1   Title L wrist AROM improve by 10 degrees in sup/flexion and extention to turn dooknob, and use L hand in bathing and dressing more than 50%   Baseline  seee flowsheet- pt report having 80% difficulty using L hand in bathing and dressing     Time 3   Period Weeks   Status New   OT SHORT TERM GOAL #2   Title Grip strength in L hand improve at least 5-8 lbs to carry 5 lbs without difficulty    Baseline no carrying anything heavy in L hand - grip L 5 , R 25    Time 3   Period Weeks   Status New           OT Long Term Goals - 07-Jun-2015 1726    OT LONG TERM GOAL #1   Title Pain on PRHWE improve by at least 10 points    Baseline pain on PRWHE is 28/50   Time 5   Period Weeks   Status New   OT LONG TERM GOAL #2   Title Function on PRWHE improve with at least 15 points    Baseline Function score on PRWHE is 26.5/50   Time 6   Period Weeks   Status New               Plan - 07-Jun-2015 1720    Clinical Impression Statement Pt present 8 1/2 wks  from Colles fracture - close distal radius- was in cast about 6 wks and then brace 2 wks - since last appt with MD not using splint except if it hurts - pt show increase pain and decrease wrist ROM in  all planes , decrease  grip and prehension strenght - decrease thumb CMC  ROM - but do not know if she had full prior -  decrease  functional use  of L hand and wristt   Pt will benefit from skilled therapeutic intervention in order to improve on the following deficits (Retired) Pain;Impaired flexibility;Decreased strength;Decreased range of motion;Impaired UE functional use   Rehab Potential Good   OT Frequency 2x / week   OT Duration 6 weeks   OT Treatment/Interventions Self-care/ADL training;Fluidtherapy;Parrafin;Splinting;Therapeutic exercises;Passive range of motion;Manual Therapy;DME and/or AE instruction   Plan Assess HEP and progress in ROM /pain    OT Home Exercise Plan see pt instruction   Consulted and Agree with Plan of Care Patient          G-Codes - 2015-06-07 1728    Functional Assessment Tool Used PRWHE , ROM ,  grip , clnicial judgement    Self Care Current Status [id number]) At least 40 percent but less than 60 percent impaired, limited or restricted   Self  Care Goal Status [id number]) At least 1 percent but less than 20 percent impaired, limited or restricted      Problem List Patient Active Problem List   Diagnosis Date Noted  . Lumbar radiculopathy 05/07/2015  . Facet syndrome, lumbar 08/21/2014  . DDD (degenerative disc disease), lumbar 08/13/2014  . Spinal stenosis of lumbar region 08/13/2014  . Spinal stenosis, lumbar region, with neurogenic claudication 08/13/2014  . Sacroiliac joint dysfunction of both sides 08/13/2014    Rosalyn Gess OTR/L,CLT  05/13/2015, 5:31 PM  [facility] [address] [place], [address] Phone: [phone]   Fax:  [phone]  Name: Kristi [name] MRN: [id number] Date of Birth: [birth year]

## 2015-05-20 ENCOUNTER — Other Ambulatory Visit: Payer: Self-pay | Admitting: Pain Medicine

## 2015-05-20 ENCOUNTER — Ambulatory Visit: Payer: Medicare Other | Attending: Pain Medicine | Admitting: Pain Medicine

## 2015-05-20 ENCOUNTER — Encounter: Payer: Self-pay | Admitting: Pain Medicine

## 2015-05-20 VITALS — BP 125/73 | HR 57 | Temp 97.8°F | Resp 10 | Ht 67.0 in | Wt 140.0 lb

## 2015-05-20 DIAGNOSIS — M4806 Spinal stenosis, lumbar region: Secondary | ICD-10-CM | POA: Insufficient documentation

## 2015-05-20 DIAGNOSIS — M79606 Pain in leg, unspecified: Secondary | ICD-10-CM | POA: Diagnosis present

## 2015-05-20 DIAGNOSIS — M5126 Other intervertebral disc displacement, lumbar region: Secondary | ICD-10-CM | POA: Insufficient documentation

## 2015-05-20 DIAGNOSIS — M545 Low back pain: Secondary | ICD-10-CM | POA: Diagnosis present

## 2015-05-20 DIAGNOSIS — M47816 Spondylosis without myelopathy or radiculopathy, lumbar region: Secondary | ICD-10-CM | POA: Insufficient documentation

## 2015-05-20 DIAGNOSIS — M4186 Other forms of scoliosis, lumbar region: Secondary | ICD-10-CM | POA: Insufficient documentation

## 2015-05-20 DIAGNOSIS — M5136 Other intervertebral disc degeneration, lumbar region: Secondary | ICD-10-CM | POA: Diagnosis not present

## 2015-05-20 DIAGNOSIS — M48062 Spinal stenosis, lumbar region with neurogenic claudication: Secondary | ICD-10-CM

## 2015-05-20 DIAGNOSIS — M8580 Other specified disorders of bone density and structure, unspecified site: Secondary | ICD-10-CM | POA: Diagnosis not present

## 2015-05-20 DIAGNOSIS — M48061 Spinal stenosis, lumbar region without neurogenic claudication: Secondary | ICD-10-CM

## 2015-05-20 DIAGNOSIS — M533 Sacrococcygeal disorders, not elsewhere classified: Secondary | ICD-10-CM

## 2015-05-20 DIAGNOSIS — M5416 Radiculopathy, lumbar region: Secondary | ICD-10-CM

## 2015-05-20 MED ORDER — BUPIVACAINE HCL (PF) 0.25 % IJ SOLN
INTRAMUSCULAR | Status: AC
  Administered 2015-05-20: 11:00:00
  Filled 2015-05-20: qty 30

## 2015-05-20 MED ORDER — MIDAZOLAM HCL 5 MG/5ML IJ SOLN
INTRAMUSCULAR | Status: AC
  Administered 2015-05-20: 2 mg via INTRAVENOUS
  Filled 2015-05-20: qty 5

## 2015-05-20 MED ORDER — FENTANYL CITRATE (PF) 100 MCG/2ML IJ SOLN
INTRAMUSCULAR | Status: AC
  Administered 2015-05-20: 100 ug via INTRAVENOUS
  Filled 2015-05-20: qty 2

## 2015-05-20 MED ORDER — TRIAMCINOLONE ACETONIDE 40 MG/ML IJ SUSP
INTRAMUSCULAR | Status: AC
  Administered 2015-05-20: 11:00:00
  Filled 2015-05-20: qty 1

## 2015-05-20 MED ORDER — CIPROFLOXACIN IN D5W 400 MG/200ML IV SOLN
INTRAVENOUS | Status: AC
  Administered 2015-05-20: 11:00:00 via INTRAVENOUS
  Filled 2015-05-20: qty 200

## 2015-05-20 MED ORDER — ORPHENADRINE CITRATE 30 MG/ML IJ SOLN
INTRAMUSCULAR | Status: DC
Start: 2015-05-20 — End: 2015-05-20
  Filled 2015-05-20: qty 2

## 2015-05-20 MED ORDER — BUPIVACAINE HCL (PF) 0.25 % IJ SOLN: 30.0000 mL | Freq: Once | INTRAMUSCULAR | Status: DC

## 2015-05-20 MED ORDER — ORPHENADRINE CITRATE 30 MG/ML IJ SOLN: 60.0000 mg | Freq: Once | INTRAMUSCULAR | Status: DC

## 2015-05-20 MED ORDER — LIDOCAINE HCL (PF) 1 % IJ SOLN: 10.0000 mL | Freq: Once | INTRAMUSCULAR | Status: DC

## 2015-05-20 MED ORDER — CIPROFLOXACIN HCL 250 MG PO TABS: 250.0000 mg | ORAL_TABLET | Freq: Two times a day (BID) | ORAL | Status: DC

## 2015-05-20 MED ORDER — CIPROFLOXACIN IN D5W 400 MG/200ML IV SOLN: 400.0000 mg | Freq: Once | INTRAVENOUS | Status: DC

## 2015-05-20 MED ORDER — MIDAZOLAM HCL 5 MG/5ML IJ SOLN: 5.0000 mg | Freq: Once | INTRAMUSCULAR | Status: DC

## 2015-05-20 MED ORDER — FENTANYL CITRATE (PF) 100 MCG/2ML IJ SOLN: 100.0000 ug | Freq: Once | INTRAMUSCULAR | Status: DC

## 2015-05-20 MED ORDER — TRIAMCINOLONE ACETONIDE 40 MG/ML IJ SUSP: 40.0000 mg | Freq: Once | INTRAMUSCULAR | Status: DC

## 2015-05-20 MED ORDER — LACTATED RINGERS IV SOLN: 1000.0000 mL | INTRAVENOUS | Status: DC

## 2015-05-20 NOTE — Progress Notes (Signed)
Safety precautions to be maintained throughout the outpatient stay will include: orient to surroundings, keep bed in low position, maintain call bell within reach at all times, provide assistance with transfer out of bed and ambulation.  

## 2015-05-20 NOTE — Progress Notes (Signed)
Subjective:    Patient ID: Kristi [name], female    DOB: [birth year], 75 y.o.   MRN: [id number]  HPI PROCEDURE PERFORMED: Lumbosacral selective nerve root block   NOTE: The patient is a 75 y.o. female who returns to [place] for further evaluation and treatment of pain involving the lumbar and lower extremity region. Studies consisting of MRI has revealed the patient to be with evidence of degenerative disc disease lumbar spine with degenerative changes L2-L3 L3-L4 with disc bulging and L3-4 facet arthropathy, severe spinal canal stenosis L4-L5 disc bulging and multilevel facet arthropathy, osteopenia, scoliosis, severe spinal canal stenosis L4-L5 disc bulging and arthropathy, retrolisthesis on the right L2-3 and L3-4. There is concern regarding intraspinal abnormalities contributing to the patient's symptomatology with concern regarding component of lumbar radiculopathy as well as lumbar stenosis felt to be contributing to patient's symptomatology. The risks, benefits, and expectations of the procedure have been explained to the patient who was understanding and in agreement with suggested treatment plan. We will proceed with interventional treatment as discussed and as explained to the patient. The patient is understanding and in agreement with suggested treatment plan.   DESCRIPTION OF PROCEDURE: Lumbosacral selective nerve root block with IV Versed, IV fentanyl conscious sedation, EKG, blood pressure, pulse, and pulse oximetry monitoring. The procedure was performed with the patient in the prone position under fluoroscopic guidance. With the patient in the prone position, Betadine prep of proposed entry site was performed. Local anesthetic skin wheal of proposed needle entry site was prepared with 1.5% plain lidocaine with AP view of the lumbosacral spine.   PROCEDURE #1: Needle placement at the right L 2 vertebral body: A 22 -gauge needle was inserted at the inferior border of the  transverse process of the vertebral body with needle placed medial to the midline of the transverse process on AP view of the lumbosacral spine.   NEEDLE PLACEMENT AT  L3, L4, and L5  VERTEBRAL BODY LEVELS  Needle  placement was accomplished at L3, L4, and L5  vertebral body levels on the right side exactly as was accomplished at the L2  vertebral body level  and utilizing the same technique and under fluoroscopic guidance.   Needle placement was then verified on lateral view at all levels with needle tip documented to be in the posterior superior quadrant of the intervertebral foramen of  L 2 L3, L4, and L5. Following negative aspiration for heme and CSF at each level, each level was injected with 3 mL of 0.25% bupivacaine with Kenalog.    The patient tolerated the procedure well. A total of 10 mg of Kenalog was utilized for the procedure.   PLAN:  1. Medications: Will continue presently prescribed medication oxycodone 2. The patient is to undergo follow-up evaluation with PCP Dr. Alice Reichert for evaluation of blood pressure and general medical condition status post procedure performed on today's visit. 3. Surgical follow-up evaluation. Has been addressed 4. Neurological evaluation. May consider [facility] EMG studies and other studies 5. May consider radiofrequency procedures, implantation type procedures and other treatment pending response to treatment and follow-up evaluation. 6. The patient has been advise do adhere to proper body mechanics and avoid activities which may aggravate condition. 7. The patient has been advised to call the [facility] prior to scheduled return appointment should there be significant change in the patient's condition or should the patient have other concerns regarding condition prior to scheduled return appointment.    Review of Systems  Objective:   Physical Exam        Assessment & Plan:

## 2015-05-20 NOTE — Patient Instructions (Addendum)
GENERAL RISKS AND COMPLICATIONS  What are the risk, side effects and possible complications? Generally speaking, most procedures are safe.  However, with any procedure there are risks, side effects, and the possibility of complications.  The risks and complications are dependent upon the sites that are lesioned, or the type of nerve block to be performed.  The closer the procedure is to the spine, the more serious the risks are.  Great care is taken when placing the radio frequency needles, block needles or lesioning probes, but sometimes complications can occur. 1. Infection: Any time there is an injection through the skin, there is a risk of infection.  This is why sterile conditions are used for these blocks.  There are four possible types of infection. 1. Localized skin infection. 2. Central Nervous System Infection-This can be in the form of Meningitis, which can be deadly. 3. Epidural Infections-This can be in the form of an epidural abscess, which can cause pressure inside of the spine, causing compression of the spinal cord with subsequent paralysis. This would require an emergency surgery to decompress, and there are no guarantees that the patient would recover from the paralysis. 4. Discitis-This is an infection of the intervertebral discs.  It occurs in about 1% of discography procedures.  It is difficult to treat and it may lead to surgery.        2. Pain: the needles have to go through skin and soft tissues, will cause soreness.       3. Damage to internal structures:  The nerves to be lesioned may be near blood vessels or    other nerves which can be potentially damaged.       4. Bleeding: Bleeding is more common if the patient is taking blood thinners such as  aspirin, Coumadin, Ticiid, Plavix, etc., or if he/she have some genetic predisposition  such as hemophilia. Bleeding into the spinal canal can cause compression of the spinal  cord with subsequent paralysis.  This would require an  emergency surgery to  decompress and there are no guarantees that the patient would recover from the  paralysis.       5. Pneumothorax:  Puncturing of a lung is a possibility, every time a needle is introduced in  the area of the chest or upper back.  Pneumothorax refers to free air around the  collapsed lung(s), inside of the thoracic cavity (chest cavity).  Another two possible  complications related to a similar event would include: Hemothorax and Chylothorax.   These are variations of the Pneumothorax, where instead of air around the collapsed  lung(s), you may have blood or chyle, respectively.       6. Spinal headaches: They may occur with any procedures in the area of the spine.       7. Persistent CSF (Cerebro-Spinal Fluid) leakage: This is a rare problem, but may occur  with prolonged intrathecal or epidural catheters either due to the formation of a fistulous  track or a dural tear.       8. Nerve damage: By working so close to the spinal cord, there is always a possibility of  nerve damage, which could be as serious as a permanent spinal cord injury with  paralysis.       9. Death:  Although rare, severe deadly allergic reactions known as "Anaphylactic  reaction" can occur to any of the medications used.      10. Worsening of the symptoms:  We can always make thing worse.    What are the chances of something like this happening? Chances of any of this occuring are extremely low.  By statistics, you have more of a chance of getting killed in a motor vehicle accident: while driving to the hospital than any of the above occurring .  Nevertheless, you should be aware that they are possibilities.  In general, it is similar to taking a shower.  Everybody knows that you can slip, hit your head and get killed.  Does that mean that you should not shower again?  Nevertheless always keep in mind that statistics do not mean anything if you happen to be on the wrong side of them.  Even if a procedure has a 1  (one) in a 1,000,000 (million) chance of going wrong, it you happen to be that one..Also, keep in mind that by statistics, you have more of a chance of having something go wrong when taking medications.  Who should not have this procedure? If you are on a blood thinning medication (e.g. Coumadin, Plavix, see list of "Blood Thinners"), or if you have an active infection going on, you should not have the procedure.  If you are taking any blood thinners, please inform your physician.  How should I prepare for this procedure?  Do not eat or drink anything at least six hours prior to the procedure.  Bring a driver with you .  It cannot be a taxi.  Come accompanied by an adult that can drive you back, and that is strong enough to help you if your legs get weak or numb from the local anesthetic.  Take all of your medicines the morning of the procedure with just enough water to swallow them.  If you have diabetes, make sure that you are scheduled to have your procedure done first thing in the morning, whenever possible.  If you have diabetes, take only half of your insulin dose and notify our nurse that you have done so as soon as you arrive at the clinic.  If you are diabetic, but only take blood sugar pills (oral hypoglycemic), then do not take them on the morning of your procedure.  You may take them after you have had the procedure.  Do not take aspirin or any aspirin-containing medications, at least eleven (11) days prior to the procedure.  They may prolong bleeding.  Wear loose fitting clothing that may be easy to take off and that you would not mind if it got stained with Betadine or blood.  Do not wear any jewelry or perfume  Remove any nail coloring.  It will interfere with some of our monitoring equipment.  NOTE: Remember that this is not meant to be interpreted as a complete list of all possible complications.  Unforeseen problems may occur.  BLOOD THINNERS The following drugs  contain aspirin or other products, which can cause increased bleeding during surgery and should not be taken for 2 weeks prior to and 1 week after surgery.  If you should need take something for relief of minor pain, you may take acetaminophen which is found in Tylenol,m Datril, Anacin-3 and Panadol. It is not blood thinner. The products listed below are.  Do not take any of the products listed below in addition to any listed on your instruction sheet.  A.P.C or A.P.C with Codeine Codeine Phosphate Capsules #3 Ibuprofen Ridaura  ABC compound Congesprin Imuran rimadil  Advil Cope Indocin Robaxisal  Alka-Seltzer Effervescent Pain Reliever and Antacid Coricidin or Coricidin-D  Indomethacin Rufen    Alka-Seltzer plus Cold Medicine Cosprin Ketoprofen S-A-C Tablets  Anacin Analgesic Tablets or Capsules Coumadin Korlgesic Salflex  Anacin Extra Strength Analgesic tablets or capsules CP-2 Tablets Lanoril Salicylate  Anaprox Cuprimine Capsules Levenox Salocol  Anexsia-D Dalteparin Magan Salsalate  Anodynos Darvon compound Magnesium Salicylate Sine-off  Ansaid Dasin Capsules Magsal Sodium Salicylate  Anturane Depen Capsules Marnal Soma  APF Arthritis pain formula Dewitt's Pills Measurin Stanback  Argesic Dia-Gesic Meclofenamic Sulfinpyrazone  Arthritis Bayer Timed Release Aspirin Diclofenac Meclomen Sulindac  Arthritis pain formula Anacin Dicumarol Medipren Supac  Analgesic (Safety coated) Arthralgen Diffunasal Mefanamic Suprofen  Arthritis Strength Bufferin Dihydrocodeine Mepro Compound Suprol  Arthropan liquid Dopirydamole Methcarbomol with Aspirin Synalgos  ASA tablets/Enseals Disalcid Micrainin Tagament  Ascriptin Doan's Midol Talwin  Ascriptin A/D Dolene Mobidin Tanderil  Ascriptin Extra Strength Dolobid Moblgesic Ticlid  Ascriptin with Codeine Doloprin or Doloprin with Codeine Momentum Tolectin  Asperbuf Duoprin Mono-gesic Trendar  Aspergum Duradyne Motrin or Motrin IB Triminicin  Aspirin  plain, buffered or enteric coated Durasal Myochrisine Trigesic  Aspirin Suppositories Easprin Nalfon Trillsate  Aspirin with Codeine Ecotrin Regular or Extra Strength Naprosyn Uracel  Atromid-S Efficin Naproxen Ursinus  Auranofin Capsules Elmiron Neocylate Vanquish  Axotal Emagrin Norgesic Verin  Azathioprine Empirin or Empirin with Codeine Normiflo Vitamin E  Azolid Emprazil Nuprin Voltaren  Bayer Aspirin plain, buffered or children's or timed BC Tablets or powders Encaprin Orgaran Warfarin Sodium  Buff-a-Comp Enoxaparin Orudis Zorpin  Buff-a-Comp with Codeine Equegesic Os-Cal-Gesic   Buffaprin Excedrin plain, buffered or Extra Strength Oxalid   Bufferin Arthritis Strength Feldene Oxphenbutazone   Bufferin plain or Extra Strength Feldene Capsules Oxycodone with Aspirin   Bufferin with Codeine Fenoprofen Fenoprofen Pabalate or Pabalate-SF   Buffets II Flogesic Panagesic   Buffinol plain or Extra Strength Florinal or Florinal with Codeine Panwarfarin   Buf-Tabs Flurbiprofen Penicillamine   Butalbital Compound Four-way cold tablets Penicillin   Butazolidin Fragmin Pepto-Bismol   Carbenicillin Geminisyn Percodan   Carna Arthritis Reliever Geopen Persantine   Carprofen Gold's salt Persistin   Chloramphenicol Goody's Phenylbutazone   Chloromycetin Haltrain Piroxlcam   Clmetidine heparin Plaquenil   Cllnoril Hyco-pap Ponstel   Clofibrate Hydroxy chloroquine Propoxyphen         Before stopping any of these medications, be sure to consult the physician who ordered them.  Some, such as Coumadin (Warfarin) are ordered to prevent or treat serious conditions such as "deep thrombosis", "pumonary embolisms", and other heart problems.  The amount of time that you may need off of the medication may also vary with the medication and the reason for which you were taking it.  If you are taking any of these medications, please make sure you notify your pain physician before you undergo any  procedures.         PLAN  Continue present medication Cipro and begin taking antibiotic as prescribed. Please obtain your antibiotic Cipro today  and begin taking antibiotic today  F/U PCP Dr Brunetta Genera for evaliation of  BP and general medical  condition.  F/U surgical evaluation. May consider pending follow-up evaluations  F/U neurological evaluation. May consider pending follow-up evaluations  May consider radiofrequency rhizolysis or intraspinal procedures pending response to present treatment and F/U evaluation.  Patient to call [facility] should patient have concerns prior to scheduled return appointmentGENERAL RISKS AND COMPLICATIONS  What are the risk, side effects and possible complications? Generally speaking, most procedures are safe.  However, with any procedure there are risks, side effects, and the possibility of complications.  The risks and complications are dependent upon the sites that are lesioned, or the type of nerve block to be performed.  The closer the procedure is to the spine, the more serious the risks are.  Great care is taken when placing the radio frequency needles, block needles or lesioning probes, but sometimes complications can occur. 2. Infection: Any time there is an injection through the skin, there is a risk of infection.  This is why sterile conditions are used for these blocks.  There are four possible types of infection. 1. Localized skin infection. 2. Central Nervous System Infection-This can be in the form of Meningitis, which can be deadly. 3. Epidural Infections-This can be in the form of an epidural abscess, which can cause pressure inside of the spine, causing compression of the spinal cord with subsequent paralysis. This would require an emergency surgery to decompress, and there are no guarantees that the patient would recover from the paralysis. 4. Discitis-This is an infection of the intervertebral discs.  It occurs in about 1%  of discography procedures.  It is difficult to treat and it may lead to surgery.        2. Pain: the needles have to go through skin and soft tissues, will cause soreness.       3. Damage to internal structures:  The nerves to be lesioned may be near blood vessels or    other nerves which can be potentially damaged.       4. Bleeding: Bleeding is more common if the patient is taking blood thinners such as  aspirin, Coumadin, Ticiid, Plavix, etc., or if he/she have some genetic predisposition  such as hemophilia. Bleeding into the spinal canal can cause compression of the spinal  cord with subsequent paralysis.  This would require an emergency surgery to  decompress and there are no guarantees that the patient would recover from the  paralysis.       5. Pneumothorax:  Puncturing of a lung is a possibility, every time a needle is introduced in  the area of the chest or upper back.  Pneumothorax refers to free air around the  collapsed lung(s), inside of the thoracic cavity (chest cavity).  Another two possible  complications related to a similar event would include: Hemothorax and Chylothorax.   These are variations of the Pneumothorax, where instead of air around the collapsed  lung(s), you may have blood or chyle, respectively.       6. Spinal headaches: They may occur with any procedures in the area of the spine.       7. Persistent CSF (Cerebro-Spinal Fluid) leakage: This is a rare problem, but may occur  with prolonged intrathecal or epidural catheters either due to the formation of a fistulous  track or a dural tear.       8. Nerve damage: By working so close to the spinal cord, there is always a possibility of  nerve damage, which could be as serious as a permanent spinal cord injury with  paralysis.       9. Death:  Although rare, severe deadly allergic reactions known as "Anaphylactic  reaction" can occur to any of the medications used.      10. Worsening of the symptoms:  We can always make thing  worse.  What are the chances of something like this happening? Chances of any of this occuring are extremely low.  By statistics, you have more of a chance of getting killed in a motor vehicle  accident: while driving to the hospital than any of the above occurring .  Nevertheless, you should be aware that they are possibilities.  In general, it is similar to taking a shower.  Everybody knows that you can slip, hit your head and get killed.  Does that mean that you should not shower again?  Nevertheless always keep in mind that statistics do not mean anything if you happen to be on the wrong side of them.  Even if a procedure has a 1 (one) in a 1,000,000 (million) chance of going wrong, it you happen to be that one..Also, keep in mind that by statistics, you have more of a chance of having something go wrong when taking medications.  Who should not have this procedure? If you are on a blood thinning medication (e.g. Coumadin, Plavix, see list of "Blood Thinners"), or if you have an active infection going on, you should not have the procedure.  If you are taking any blood thinners, please inform your physician.  How should I prepare for this procedure?  Do not eat or drink anything at least six hours prior to the procedure.  Bring a driver with you .  It cannot be a taxi.  Come accompanied by an adult that can drive you back, and that is strong enough to help you if your legs get weak or numb from the local anesthetic.  Take all of your medicines the morning of the procedure with just enough water to swallow them.  If you have diabetes, make sure that you are scheduled to have your procedure done first thing in the morning, whenever possible.  If you have diabetes, take only half of your insulin dose and notify our nurse that you have done so as soon as you arrive at the clinic.  If you are diabetic, but only take blood sugar pills (oral hypoglycemic), then do not take them on the morning of your  procedure.  You may take them after you have had the procedure.  Do not take aspirin or any aspirin-containing medications, at least eleven (11) days prior to the procedure.  They may prolong bleeding.  Wear loose fitting clothing that may be easy to take off and that you would not mind if it got stained with Betadine or blood.  Do not wear any jewelry or perfume  Remove any nail coloring.  It will interfere with some of our monitoring equipment.  NOTE: Remember that this is not meant to be interpreted as a complete list of all possible complications.  Unforeseen problems may occur.  BLOOD THINNERS The following drugs contain aspirin or other products, which can cause increased bleeding during surgery and should not be taken for 2 weeks prior to and 1 week after surgery.  If you should need take something for relief of minor pain, you may take acetaminophen which is found in Tylenol,m Datril, Anacin-3 and Panadol. It is not blood thinner. The products listed below are.  Do not take any of the products listed below in addition to any listed on your instruction sheet.  A.P.C or A.P.C with Codeine Codeine Phosphate Capsules #3 Ibuprofen Ridaura  ABC compound Congesprin Imuran rimadil  Advil Cope Indocin Robaxisal  Alka-Seltzer Effervescent Pain Reliever and Antacid Coricidin or Coricidin-D  Indomethacin Rufen  Alka-Seltzer plus Cold Medicine Cosprin Ketoprofen S-A-C Tablets  Anacin Analgesic Tablets or Capsules Coumadin Korlgesic Salflex  Anacin Extra Strength Analgesic tablets or capsules CP-2 Tablets Lanoril Salicylate  Anaprox Cuprimine Capsules Levenox Salocol  Anexsia-D Dalteparin Magan Salsalate  Anodynos Darvon compound Magnesium Salicylate Sine-off  Ansaid Dasin Capsules Magsal Sodium Salicylate  Anturane Depen Capsules Marnal Soma  APF Arthritis pain formula Dewitt's Pills Measurin Stanback  Argesic Dia-Gesic Meclofenamic Sulfinpyrazone  Arthritis Bayer Timed Release Aspirin  Diclofenac Meclomen Sulindac  Arthritis pain formula Anacin Dicumarol Medipren Supac  Analgesic (Safety coated) Arthralgen Diffunasal Mefanamic Suprofen  Arthritis Strength Bufferin Dihydrocodeine Mepro Compound Suprol  Arthropan liquid Dopirydamole Methcarbomol with Aspirin Synalgos  ASA tablets/Enseals Disalcid Micrainin Tagament  Ascriptin Doan's Midol Talwin  Ascriptin A/D Dolene Mobidin Tanderil  Ascriptin Extra Strength Dolobid Moblgesic Ticlid  Ascriptin with Codeine Doloprin or Doloprin with Codeine Momentum Tolectin  Asperbuf Duoprin Mono-gesic Trendar  Aspergum Duradyne Motrin or Motrin IB Triminicin  Aspirin plain, buffered or enteric coated Durasal Myochrisine Trigesic  Aspirin Suppositories Easprin Nalfon Trillsate  Aspirin with Codeine Ecotrin Regular or Extra Strength Naprosyn Uracel  Atromid-S Efficin Naproxen Ursinus  Auranofin Capsules Elmiron Neocylate Vanquish  Axotal Emagrin Norgesic Verin  Azathioprine Empirin or Empirin with Codeine Normiflo Vitamin E  Azolid Emprazil Nuprin Voltaren  Bayer Aspirin plain, buffered or children's or timed BC Tablets or powders Encaprin Orgaran Warfarin Sodium  Buff-a-Comp Enoxaparin Orudis Zorpin  Buff-a-Comp with Codeine Equegesic Os-Cal-Gesic   Buffaprin Excedrin plain, buffered or Extra Strength Oxalid   Bufferin Arthritis Strength Feldene Oxphenbutazone   Bufferin plain or Extra Strength Feldene Capsules Oxycodone with Aspirin   Bufferin with Codeine Fenoprofen Fenoprofen Pabalate or Pabalate-SF   Buffets II Flogesic Panagesic   Buffinol plain or Extra Strength Florinal or Florinal with Codeine Panwarfarin   Buf-Tabs Flurbiprofen Penicillamine   Butalbital Compound Four-way cold tablets Penicillin   Butazolidin Fragmin Pepto-Bismol   Carbenicillin Geminisyn Percodan   Carna Arthritis Reliever Geopen Persantine   Carprofen Gold's salt Persistin   Chloramphenicol Goody's Phenylbutazone   Chloromycetin Haltrain Piroxlcam    Clmetidine heparin Plaquenil   Cllnoril Hyco-pap Ponstel   Clofibrate Hydroxy chloroquine Propoxyphen         Before stopping any of these medications, be sure to consult the physician who ordered them.  Some, such as Coumadin (Warfarin) are ordered to prevent or treat serious conditions such as "deep thrombosis", "pumonary embolisms", and other heart problems.  The amount of time that you may need off of the medication may also vary with the medication and the reason for which you were taking it.  If you are taking any of these medications, please make sure you notify your pain physician before you undergo any procedures.

## 2015-05-21 ENCOUNTER — Telehealth: Payer: Self-pay | Admitting: *Deleted

## 2015-05-21 NOTE — Telephone Encounter (Signed)
Patient verbalizes no questions or concerns from procedure on yesterday. 

## 2015-05-22 ENCOUNTER — Ambulatory Visit: Payer: Medicare Other | Attending: Orthopedic Surgery | Admitting: Occupational Therapy

## 2015-05-22 DIAGNOSIS — M25632 Stiffness of left wrist, not elsewhere classified: Secondary | ICD-10-CM

## 2015-05-22 DIAGNOSIS — M79632 Pain in left forearm: Secondary | ICD-10-CM | POA: Insufficient documentation

## 2015-05-22 DIAGNOSIS — M6281 Muscle weakness (generalized): Secondary | ICD-10-CM | POA: Insufficient documentation

## 2015-05-22 NOTE — Therapy (Signed)
[place] [facility] [address] [place], [address] Phone: [phone]   Fax:  [phone]  Occupational Therapy Treatment  Patient Details  Name: Kristi [name] MRN: [id number] Date of Birth: May 21, 1940 Referring Provider: Mack Guise  Encounter Date: 05/22/2015      OT End of Session - 05/22/15 1431    Visit Number 2   Number of Visits 12   Date for OT Re-Evaluation 06/24/15   OT Start Time 1045   OT Stop Time 1130   OT Time Calculation (min) 45 min   Activity Tolerance Patient tolerated treatment well   Behavior During Therapy [facility] for tasks assessed/performed      Past Medical History  Diagnosis Date  . Hyperlipidemia   . Spinal stenosis   . Anxiety   . Myocardial infarction ([place])   . Depression   . Hypothyroid   . Hypertension     Past Surgical History  Procedure Laterality Date  . Abdominal hysterectomy    . Coronary stent placement    . Coronary artery bypass graft    . S/p pacer insertion      There were no vitals filed for this visit.  Visit Diagnosis:  Pain of left forearm  Stiffness of left wrist joint  Muscle weakness      Subjective Assessment - 05/22/15 1101    Subjective  My wrist is hurting more , pain about 9/10 - I stopped wearing splint when I seen the DR the last time - using it more - but at night time I have more pain - had shots this am  in my back    Patient Stated Goals I want to get the pain better, move and use it more - like lifting things, hold book , squeeze washcloth, bath, open jars ,cutting , driving and open car door   Currently in Pain? Yes   Pain Score 9    Pain Location Wrist   Pain Orientation Left   Pain Descriptors / Indicators Aching   Pain Onset In the past 7 days            [facility] OT Assessment - 05/22/15 0001    AROM   Left Forearm Supination 85 Degrees   Left Wrist Extension 48 Degrees   Left Wrist Flexion 58 Degrees   Left Wrist Radial  Deviation 24 Degrees   Left Wrist Ulnar Deviation 35 Degrees   Strength   Left Hand Grip (lbs) 5   Left Hand Lateral Pinch 7 lbs   Left Hand 3 Point Pinch 4 lbs                  OT Treatments/Exercises (OP) - 05/22/15 0001    Wrist Exercises   Other wrist exercises PROM for wrist in ext, flexion , RD and UD , AROM in all planes - needed mod A    Other wrist exercises 1 lbs for wrist in all planes - 10 reps - no increase pain    Hand Exercises   Other Hand Exercises Teal putty for grip , lat and 3 point grip - 12 reps eac h   LUE Fluidotherapy   Number Minutes Fluidotherapy 12 Minutes   LUE Fluidotherapy Location Hand;Wrist   Comments At [facility] to decreas pain at wrist to 0/10                 OT Education - 05/22/15 1431    Education provided Yes  Education Details HEP   Person(s) Educated Patient   Methods Explanation;Demonstration;Tactile cues;Verbal cues;Handout   Comprehension Verbal cues required;Verbalized understanding;Returned demonstration          OT Short Term Goals - 05/13/15 1724    OT SHORT TERM GOAL #1   Title L wrist AROM improve by 10 degrees in sup/flexion and extention to turn dooknob, and use L hand in bathing and dressing more than 50%   Baseline  seee flowsheet- pt report having 80% difficulty using L hand in bathing and dressing    Time 3   Period Weeks   Status New   OT SHORT TERM GOAL #2   Title Grip strength in L hand improve at least 5-8 lbs to carry 5 lbs without difficulty    Baseline no carrying anything heavy in L hand - grip L 5 , R 25    Time 3   Period Weeks   Status New           OT Long Term Goals - 05/13/15 1726    OT LONG TERM GOAL #1   Title Pain on PRHWE improve by at least 10 points    Baseline pain on PRWHE is 28/50   Time 5   Period Weeks   Status New   OT LONG TERM GOAL #2   Title Function on PRWHE improve with at least 15 points    Baseline Function score on PRWHE is 26.5/50   Time 6   Period  Weeks   Status New               Plan - 05/22/15 1432    Clinical Impression Statement Pt showed increase wrist AROM in all planes but grip and prehension did not improcve - pt arrive with pain 9/10 - pt pain decrease in fluido to 0/10 and during session did not increase - pt to use heat prior to HEP and upgrade to teal putty and 1 lbs weigth    Pt will benefit from skilled therapeutic intervention in order to improve on the following deficits (Retired) Pain;Impaired flexibility;Decreased strength;Decreased range of motion;Impaired UE functional use   Rehab Potential Good   OT Frequency 2x / week   OT Duration 6 weeks   OT Treatment/Interventions Self-care/ADL training;Fluidtherapy;Parrafin;Splinting;Therapeutic exercises;Passive range of motion;Manual Therapy;DME and/or AE instruction   Plan assess pain and progress    OT Home Exercise Plan see pt instruction   Consulted and Agree with Plan of Care Patient        Problem List Patient Active Problem List   Diagnosis Date Noted  . Lumbar radiculopathy 05/07/2015  . Facet syndrome, lumbar 08/21/2014  . DDD (degenerative disc disease), lumbar 08/13/2014  . Spinal stenosis of lumbar region 08/13/2014  . Spinal stenosis, lumbar region, with neurogenic claudication 08/13/2014  . Sacroiliac joint dysfunction of both sides 08/13/2014    Rosalyn Gess OTR/L,CLT  05/22/2015, 2:34 PM  [facility] [address] [place], [address] Phone: [phone]   Fax:  [phone]  Name: Kristi [name] MRN: [id number] Date of Birth: 01/26/1941

## 2015-05-22 NOTE — Patient Instructions (Signed)
Add 1 lbs for wrist in all planes 10 reps  2 x day   And upgrade to teal putty for grip , lat and 3 point grip - 10-12 reps 2 x day  Reinforce stop before pain  And do heat prior

## 2015-05-27 ENCOUNTER — Encounter: Payer: Medicare Other | Admitting: Occupational Therapy

## 2015-05-27 ENCOUNTER — Telehealth: Payer: Self-pay

## 2015-05-27 NOTE — Telephone Encounter (Signed)
Nurses and Secretaries Please schedule cluneal and sciatic nerve block for 06/03/2015 We will assess patient on 06/03/2015 and may consider performing procedure after evaluation the patient

## 2015-05-27 NOTE — Telephone Encounter (Signed)
Pt says she had a procedure on 3/1 and pt is still in pain pt would like to have a procedure during her next scheduled appointment

## 2015-05-29 ENCOUNTER — Ambulatory Visit: Payer: Medicare Other | Admitting: Occupational Therapy

## 2015-05-29 ENCOUNTER — Encounter: Payer: Self-pay | Admitting: Occupational Therapy

## 2015-05-29 DIAGNOSIS — M79632 Pain in left forearm: Secondary | ICD-10-CM

## 2015-05-29 DIAGNOSIS — M25632 Stiffness of left wrist, not elsewhere classified: Secondary | ICD-10-CM

## 2015-05-29 DIAGNOSIS — M6281 Muscle weakness (generalized): Secondary | ICD-10-CM

## 2015-05-29 NOTE — Patient Instructions (Signed)
Cont with HEP and functional use left hand for daily activities.

## 2015-05-29 NOTE — Therapy (Signed)
[place] [facility] [address] [place], [address] Phone: [phone]   Fax:  [phone]  Occupational Therapy Treatment  Patient Details  Name: Kristi [name] MRN: [id number] Date of Birth: Mar 06, 1941 Referring Provider: Mack Guise  Encounter Date: 05/29/2015      OT End of Session - 05/29/15 1233    OT Start Time 1152   OT Stop Time 1232   OT Time Calculation (min) 40 min   Activity Tolerance Patient tolerated treatment well   Behavior During Therapy [facility] for tasks assessed/performed      Past Medical History  Diagnosis Date  . Hyperlipidemia   . Spinal stenosis   . Anxiety   . Myocardial infarction ([place])   . Depression   . Hypothyroid   . Hypertension     Past Surgical History  Procedure Laterality Date  . Abdominal hysterectomy    . Coronary stent placement    . Coronary artery bypass graft    . S/p pacer insertion      There were no vitals filed for this visit.  Visit Diagnosis:  Pain of left forearm  Stiffness of left wrist joint  Muscle weakness      Subjective Assessment - 05/29/15 1154    Subjective  Pt reports pain in wrist as 5/10. She states that the only thing that helps it "Is a pain pill"    Patient Stated Goals I want to get the pain better, move and use it more - like lifting things, hold book , squeeze washcloth, bath, open jars ,cutting , driving and open car door   Currently in Pain? Yes   Pain Score 5    Pain Location Wrist   Pain Orientation Left   Pain Descriptors / Indicators Aching   Pain Type Chronic pain   Pain Onset 1 to 4 weeks ago   Pain Frequency Intermittent   Pain Relieving Factors "Pain pill"                       OT Treatments/Exercises (OP) - 05/29/15 0001    Wrist Exercises   Other wrist exercises PROM for wrist in ext, flexion , RD and UD , AROM in all planes - needed mod A    Other wrist exercises 1 lbs for wrist in all planes -  10 reps - no increase pain    Hand Exercises   Other Hand Exercises Teal putty for grip , lat and 3 point grip - 10 reps each Mod VC's   LUE Fluidotherapy   Number Minutes Fluidotherapy 12 Minutes   LUE Fluidotherapy Location Hand;Wrist   Comments At [facility] to assist with decreasing pain and increasing AROM at wrist to 0/10                OT Education - 05/29/15 1208    Education provided Yes   Education Details HEP   Person(s) Educated Patient   Methods Explanation;Demonstration;Tactile cues;Verbal cues   Comprehension Verbalized understanding;Verbal cues required;Tactile cues required;Returned demonstration          OT Short Term Goals - 05/13/15 1724    OT SHORT TERM GOAL #1   Title L wrist AROM improve by 10 degrees in sup/flexion and extention to turn dooknob, and use L hand in bathing and dressing more than 50%   Baseline  seee flowsheet- pt report having 80% difficulty using L hand in bathing and dressing    Time 3  Period Weeks   Status New   OT SHORT TERM GOAL #2   Title Grip strength in L hand improve at least 5-8 lbs to carry 5 lbs without difficulty    Baseline no carrying anything heavy in L hand - grip L 5 , R 25    Time 3   Period Weeks   Status New           OT Long Term Goals - 05/13/15 1726    OT LONG TERM GOAL #1   Title Pain on PRHWE improve by at least 10 points    Baseline pain on PRWHE is 28/50   Time 5   Period Weeks   Status New   OT LONG TERM GOAL #2   Title Function on PRWHE improve with at least 15 points    Baseline Function score on PRWHE is 26.5/50   Time 6   Period Weeks   Status New               Plan - 05/29/15 1233    Clinical Impression Statement Pt demonstrates increased AROM left wirst after exercises, pain upon arrival was 5/10, at conclusion of treatment 2/10. Pt was educated to return in 1 week for f/u and upgrade of HEP if appropriate. Consider upgrade of putty.   Pt will benefit from skilled therapeutic  intervention in order to improve on the following deficits (Retired) Pain;Impaired flexibility;Decreased strength;Decreased range of motion;Impaired UE functional use   Rehab Potential Good   OT Frequency 2x / week   OT Duration 6 weeks   OT Treatment/Interventions Self-care/ADL training;Fluidtherapy;Parrafin;Splinting;Therapeutic exercises;Passive range of motion;Manual Therapy;DME and/or AE instruction   Plan Check AROM, pain & functional use left hand.   OT Home Exercise Plan see pt instruction   Consulted and Agree with Plan of Care Patient        Problem List Patient Active Problem List   Diagnosis Date Noted  . Lumbar radiculopathy 05/07/2015  . Facet syndrome, lumbar 08/21/2014  . DDD (degenerative disc disease), lumbar 08/13/2014  . Spinal stenosis of lumbar region 08/13/2014  . Spinal stenosis, lumbar region, with neurogenic claudication 08/13/2014  . Sacroiliac joint dysfunction of both sides 08/13/2014    Carlynn Herald, [employer], OTR/L 05/29/2015, 12:37 PM  [place] [facility] [address] [place], [address] Phone: [phone]   Fax:  [phone]  Name: Kristi [name] MRN: [id number] Date of Birth: Jul 04, 1940

## 2015-06-02 ENCOUNTER — Ambulatory Visit: Payer: Medicare Other | Admitting: Occupational Therapy

## 2015-06-02 DIAGNOSIS — M6281 Muscle weakness (generalized): Secondary | ICD-10-CM

## 2015-06-02 DIAGNOSIS — M79632 Pain in left forearm: Secondary | ICD-10-CM

## 2015-06-02 DIAGNOSIS — M25632 Stiffness of left wrist, not elsewhere classified: Secondary | ICD-10-CM

## 2015-06-02 NOTE — Patient Instructions (Signed)
Needed mod v/c and hand on correction of HEP  Add wrist ext/flexion 1 lbsand pt to do sup//pro correctly   As well as putty teal - correctly   Soft Benik splint fitted and to wear with activities that cause pain

## 2015-06-02 NOTE — Therapy (Signed)
[place] [facility] [address], [place], [address] Phone: [phone]   Fax:  [phone]  Occupational Therapy Treatment  Patient Details  Name: Kristi [name] MRN: [id number] Date of Birth: September 02, 1940 Referring Provider: Mack Guise  Encounter Date: 06/02/2015      OT End of Session - 06/02/15 1648    Visit Number 4   Number of Visits 12   Date for OT Re-Evaluation 06/24/15   OT Start Time 1520   OT Stop Time 1603   OT Time Calculation (min) 43 min   Activity Tolerance Patient tolerated treatment well   Behavior During Therapy [facility] for tasks assessed/performed      Past Medical History  Diagnosis Date  . Hyperlipidemia   . Spinal stenosis   . Anxiety   . Myocardial infarction ([place])   . Depression   . Hypothyroid   . Hypertension     Past Surgical History  Procedure Laterality Date  . Abdominal hysterectomy    . Coronary stent placement    . Coronary artery bypass graft    . S/p pacer insertion      There were no vitals filed for this visit.  Visit Diagnosis:  Pain of left forearm  Stiffness of left wrist joint  Muscle weakness      Subjective Assessment - 06/02/15 1639    Subjective  My pain about 6/10, it hurts when I open jars , cut food , or lift anything with weight, squeezing washcloth , wet laundry and driving - I just push myself- I don't like the splint with metal bar so I stopped wearing it    Patient Stated Goals I want to get the pain better, move and use it more - like lifting things, hold book , squeeze washcloth, bath, open jars ,cutting , driving and open car door   Currently in Pain? Yes   Pain Score 6    Pain Location Wrist   Pain Orientation Left   Pain Descriptors / Indicators Aching            [facility] OT Assessment - 06/02/15 0001    AROM   Left Wrist Extension 46 Degrees   Left Wrist Flexion 55 Degrees   Strength   Left Hand Grip (lbs) 10   Left Hand Lateral  Pinch 5 lbs   Left Hand 3 Point Pinch 5 lbs                  OT Treatments/Exercises (OP) - 06/02/15 0001    Wrist Exercises   Other wrist exercises PRayer stretch, and PROM for wrist flexion /extention - followed by  1 lbs weigth    Other wrist exercises Add wrist extention and flexion - pt was not doing that one - as well as sup/pro was doing rotations of elbow and shoulder - 1 lbs for wrist in all planes - 10 reps    Hand Exercises   Other Hand Exercises Grip , lat and 3 point grip - but pt needed hands on for grip - to rotate putty , and  correctly do 3 point    LUE Fluidotherapy   Number Minutes Fluidotherapy 12 Minutes   LUE Fluidotherapy Location Hand;Wrist   Comments St [facility] to decrease pain and increase ROM        Pt to do HEP 2 x day - was only doing about 1 and pushed pain more than 2/10  OT Education - 06/02/15 1648    Education provided Yes   Education Details HEP   Person(s) Educated Patient   Methods Explanation;Demonstration;Tactile cues;Verbal cues;Handout   Comprehension Returned demonstration;Verbal cues required;Verbalized understanding          OT Short Term Goals - 05/13/15 1724    OT SHORT TERM GOAL #1   Title L wrist AROM improve by 10 degrees in sup/flexion and extention to turn dooknob, and use L hand in bathing and dressing more than 50%   Baseline  seee flowsheet- pt report having 80% difficulty using L hand in bathing and dressing    Time 3   Period Weeks   Status New   OT SHORT TERM GOAL #2   Title Grip strength in L hand improve at least 5-8 lbs to carry 5 lbs without difficulty    Baseline no carrying anything heavy in L hand - grip L 5 , R 25    Time 3   Period Weeks   Status New           OT Long Term Goals - 05/13/15 1726    OT LONG TERM GOAL #1   Title Pain on PRHWE improve by at least 10 points    Baseline pain on PRWHE is 28/50   Time 5   Period Weeks   Status New   OT LONG TERM GOAL #2   Title  Function on PRWHE improve with at least 15 points    Baseline Function score on PRWHE is 26.5/50   Time 6   Period Weeks   Status New               Plan - 06/02/15 1649    Clinical Impression Statement Pt showed increase grip stregnth but wrist pain still increase when coming in - pt fitted with BEnik neoprene splint to use with pain full actvities - keep pain under 2/10 - and 1 lbs weight for wrist and teal med putty still - but need mod v/c and hands ons corrections    Pt will benefit from skilled therapeutic intervention in order to improve on the following deficits (Retired) Pain;Impaired flexibility;Decreased strength;Decreased range of motion;Impaired UE functional use   Rehab Potential Good   OT Frequency 2x / week   OT Duration 6 weeks   OT Treatment/Interventions Self-care/ADL training;Fluidtherapy;Parrafin;Splinting;Therapeutic exercises;Passive range of motion;Manual Therapy;DME and/or AE instruction   Plan check ROM , pain , use of soft splint    OT Home Exercise Plan see pt instruction   Consulted and Agree with Plan of Care Patient        Problem List Patient Active Problem List   Diagnosis Date Noted  . Lumbar radiculopathy 05/07/2015  . Facet syndrome, lumbar 08/21/2014  . DDD (degenerative disc disease), lumbar 08/13/2014  . Spinal stenosis of lumbar region 08/13/2014  . Spinal stenosis, lumbar region, with neurogenic claudication 08/13/2014  . Sacroiliac joint dysfunction of both sides 08/13/2014    Rosalyn Gess OTR/L,CLT  06/02/2015, 4:51 PM  [place] [facility] [address], [place], [address] Phone: [phone]   Fax:  [phone]  Name: Kristi [name] MRN: [id number] Date of Birth: 07/14/1940

## 2015-06-03 ENCOUNTER — Ambulatory Visit: Payer: Medicare Other | Attending: Pain Medicine | Admitting: Pain Medicine

## 2015-06-03 ENCOUNTER — Encounter: Payer: Self-pay | Admitting: Pain Medicine

## 2015-06-03 VITALS — BP 147/82 | HR 59 | Temp 98.1°F | Resp 18 | Ht 67.0 in | Wt 145.0 lb

## 2015-06-03 DIAGNOSIS — M48062 Spinal stenosis, lumbar region with neurogenic claudication: Secondary | ICD-10-CM

## 2015-06-03 DIAGNOSIS — M5416 Radiculopathy, lumbar region: Secondary | ICD-10-CM

## 2015-06-03 DIAGNOSIS — M5126 Other intervertebral disc displacement, lumbar region: Secondary | ICD-10-CM | POA: Insufficient documentation

## 2015-06-03 DIAGNOSIS — M4806 Spinal stenosis, lumbar region: Secondary | ICD-10-CM | POA: Insufficient documentation

## 2015-06-03 DIAGNOSIS — M533 Sacrococcygeal disorders, not elsewhere classified: Secondary | ICD-10-CM

## 2015-06-03 DIAGNOSIS — M4186 Other forms of scoliosis, lumbar region: Secondary | ICD-10-CM | POA: Diagnosis not present

## 2015-06-03 DIAGNOSIS — M48061 Spinal stenosis, lumbar region without neurogenic claudication: Secondary | ICD-10-CM

## 2015-06-03 DIAGNOSIS — M8580 Other specified disorders of bone density and structure, unspecified site: Secondary | ICD-10-CM | POA: Diagnosis not present

## 2015-06-03 DIAGNOSIS — M5136 Other intervertebral disc degeneration, lumbar region: Secondary | ICD-10-CM | POA: Diagnosis not present

## 2015-06-03 DIAGNOSIS — M545 Low back pain: Secondary | ICD-10-CM | POA: Insufficient documentation

## 2015-06-03 DIAGNOSIS — M47816 Spondylosis without myelopathy or radiculopathy, lumbar region: Secondary | ICD-10-CM | POA: Diagnosis not present

## 2015-06-03 DIAGNOSIS — M1288 Other specific arthropathies, not elsewhere classified, other specified site: Secondary | ICD-10-CM | POA: Diagnosis not present

## 2015-06-03 MED ORDER — TRIAMCINOLONE ACETONIDE 40 MG/ML IJ SUSP
40.0000 mg | Freq: Once | INTRAMUSCULAR | Status: AC
  Administered 2015-06-03: 40 mg

## 2015-06-03 MED ORDER — ORPHENADRINE CITRATE 30 MG/ML IJ SOLN: 60.0000 mg | Freq: Once | INTRAMUSCULAR | Status: DC

## 2015-06-03 MED ORDER — TRIAMCINOLONE ACETONIDE 40 MG/ML IJ SUSP
INTRAMUSCULAR | Status: AC
  Administered 2015-06-03: 40 mg
  Filled 2015-06-03: qty 1

## 2015-06-03 MED ORDER — MIDAZOLAM HCL 5 MG/5ML IJ SOLN
INTRAMUSCULAR | Status: AC
  Filled 2015-06-03: qty 5

## 2015-06-03 MED ORDER — FENTANYL CITRATE (PF) 100 MCG/2ML IJ SOLN
100.0000 ug | Freq: Once | INTRAMUSCULAR | Status: AC
  Administered 2015-06-03: 1 ug via INTRAVENOUS

## 2015-06-03 MED ORDER — CIPROFLOXACIN IN D5W 400 MG/200ML IV SOLN
400.0000 mg | Freq: Once | INTRAVENOUS | Status: AC
  Administered 2015-06-03: 400 mg via INTRAVENOUS

## 2015-06-03 MED ORDER — SODIUM CHLORIDE 0.9% FLUSH: 20.0000 mL | Freq: Once | INTRAVENOUS | Status: DC

## 2015-06-03 MED ORDER — BUPIVACAINE HCL (PF) 0.25 % IJ SOLN
INTRAMUSCULAR | Status: AC
  Filled 2015-06-03: qty 30

## 2015-06-03 MED ORDER — CIPROFLOXACIN IN D5W 400 MG/200ML IV SOLN
INTRAVENOUS | Status: AC
  Administered 2015-06-03: 400 mg via INTRAVENOUS
  Filled 2015-06-03: qty 200

## 2015-06-03 MED ORDER — LIDOCAINE HCL (PF) 1 % IJ SOLN
INTRAMUSCULAR | Status: AC
  Filled 2015-06-03: qty 5

## 2015-06-03 MED ORDER — MIDAZOLAM HCL 5 MG/5ML IJ SOLN
5.0000 mg | Freq: Once | INTRAMUSCULAR | Status: AC
  Administered 2015-06-03: 1 mg via INTRAVENOUS

## 2015-06-03 MED ORDER — ORPHENADRINE CITRATE 30 MG/ML IJ SOLN
INTRAMUSCULAR | Status: AC
  Filled 2015-06-03: qty 2

## 2015-06-03 MED ORDER — FENTANYL CITRATE (PF) 100 MCG/2ML IJ SOLN
INTRAMUSCULAR | Status: AC
  Administered 2015-06-03: 1 ug via INTRAVENOUS
  Filled 2015-06-03: qty 2

## 2015-06-03 MED ORDER — LIDOCAINE HCL (PF) 1 % IJ SOLN: 10.0000 mL | Freq: Once | INTRAMUSCULAR | Status: DC

## 2015-06-03 MED ORDER — CIPROFLOXACIN HCL 250 MG PO TABS: 250.0000 mg | ORAL_TABLET | Freq: Two times a day (BID) | ORAL | Status: DC

## 2015-06-03 MED ORDER — LACTATED RINGERS IV SOLN: 1000.0000 mL | INTRAVENOUS | Status: DC

## 2015-06-03 MED ORDER — SODIUM CHLORIDE 0.9 % IJ SOLN
INTRAMUSCULAR | Status: AC
Start: 2015-06-03 — End: 2015-06-03
  Administered 2015-06-03: 10 mL
  Filled 2015-06-03: qty 20

## 2015-06-03 MED ORDER — BUPIVACAINE HCL (PF) 0.25 % IJ SOLN: 30.0000 mL | Freq: Once | INTRAMUSCULAR | Status: DC

## 2015-06-03 NOTE — Progress Notes (Signed)
Subjective:    Patient ID: Kristi [name], female    DOB: 26-Aug-1940, 75 y.o.   MRN: [id number]  HPI  PROCEDURE PERFORMED: Lumbar facet (medial branch block)   NOTE: The patient is a 75 y.o. female who returns to [place] for further evaluation and treatment of pain involving the lumbar and lower extremity region. MRI revealed ID\degenerative disc disease lumbar spine Degenerative changes L2-L3 L3-L4 with disc bulging and L3-4 facet arthropathy, severe spinal canal stenosis L4-L5 disc bulging and multilevel facet arthropathy, osteopenia, scoliosis, severe spinal canal stenosis L4-L5 disc bulging and arthropathy, retrolisthesis on the right L2-3 and L3-4  Patient is with significant facet syndrome The risks, benefits, and expectations of the procedure have been discussed and explained to the patient who was understanding and in agreement with suggested treatment plan. We will proceed with interventional treatment as discussed and as explained to the patient who was understanding and wished to proceed with procedure as planned.   DESCRIPTION OF PROCEDURE: Lumbar facet (medial branch block) with IV Versed, IV fentanyl conscious sedation, EKG, blood pressure, pulse, and pulse oximetry monitoring. The procedure was performed with the patient in the prone position. Betadine prep of proposed entry site performed.   NEEDLE PLACEMENT AT: left L 2 lumbar facet (medial branch block). Under fluoroscopic guidance with oblique orientation of 15 degrees, a 22-gauge needle was inserted at the L 2 vertebral body level with needle placed at the targeted area of Burton's Eye or Eye of the Scotty Dog with documentation of needle placement in the superior and lateral border of targeted area of Burton's Eye or Eye of the Scotty Dog with oblique orientation of 15 degrees. Following documentation of needle placement at the L 2 vertebral body level, needle placement was then accomplished at the L 3 vertebral  body level.   NEEDLE PLACEMENT AT L3, L4, and L5 VERTEBRAL BODY LEVELS ON THE LEFT SIDE The procedure was performed at the L3, L4, and L5 vertebral body levels exactly as was performed at the L 2 vertebral body level utilizing the same technique and under fluoroscopic guidance.  NEEDLE PLACEMENT AT THE SACRAL ALA with AP view of the lumbosacral spine. With the patient in the prone position, Betadine prep of proposed entry site accomplished, a 22 gauge needle was inserted in the region of the sacral ala (groove formed by the superior articulating process of S1 and the sacral wing). Following documentation of needle placement at the sacral ala,    Needle placement was then verified at all levels on lateral view. Following documentation of needle placement at all levels on lateral view and following negative aspiration for heme and CSF, each level was injected with 1 mL of 0.25% bupivacaine with Kenalog.     LUMBAR FACET, MEDIAL BRANCH NERVE, BLOCKS PERFORMED ON THE RIGHT SIDE   The procedure was performed on the right side exactly as was performed on the left side at the same levels and utilizing the same technique under fluoroscopic guidance.     The patient tolerated the procedure well. A total of 40 mg of Kenalog was utilized for the procedure.   PLAN:  1. Medications: The patient will continue presently prescribed medication oxycodone 2. May consider modification of treatment regimen at time of return appointment pending response to treatment rendered on today's visit. 3. The patient is to follow-up with primary care physician Dr Brunetta Genera for further evaluation of blood pressure and general medical condition status post steroid injection performed on today's  visit. 4. Surgical follow-up evaluation.May consider 5. Neurological follow-up evaluation. May consider 6. The patient may be candidate for radiofrequency procedures, implantation type procedures, and other treatment pending  response to treatment and follow-up evaluation. 7. The patient has been advised to call the Pain Management Center prior to scheduled return appointment should there be significant change in condition or should patient have other concerns regarding condition prior to scheduled return appointment.  The patient is understanding and in agreement with suggested treatment plan.   Review of Systems     Objective:   Physical Exam        Assessment & Plan:

## 2015-06-03 NOTE — Patient Instructions (Addendum)
PLAN  Continue present medication Cipro and begin taking antibiotic as prescribed. Please obtain your antibiotic Cipro today  and begin taking antibiotic today  F/U PCP Dr Brunetta Genera for evaliation of  BP and general medical  condition.  F/U surgical evaluation. May consider pending follow-up evaluations  F/U neurological evaluation. May consider pending follow-up evaluations  May consider radiofrequency rhizolysis or intraspinal procedures pending response to present treatment and F/U evaluation.  Patient to call Pain Management Center should patient have concerns prior to scheduled return appointmentPain Management Discharge Instructions  General Discharge Instructions :  If you need to reach your doctor call: Monday-Friday 8:00 am - 4:00 pm at [phone] or toll free [phone].  After clinic hours [phone] to have operator reach doctor.  Bring all of your medication bottles to all your appointments in the pain clinic.  To cancel or reschedule your appointment with Pain Management please remember to call 24 hours in advance to avoid a fee.  Refer to the educational materials which you have been given on: General Risks, I had my Procedure. Discharge Instructions, Post Sedation.  Post Procedure Instructions:  The drugs you were given will stay in your system until tomorrow, so for the next 24 hours you should not drive, make any legal decisions or drink any alcoholic beverages.  You may eat anything you prefer, but it is better to start with liquids then soups and crackers, and gradually work up to solid foods.  Please notify your doctor immediately if you have any unusual bleeding, trouble breathing or pain that is not related to your normal pain.  Depending on the type of procedure that was done, some parts of your body may feel week and/or numb.  This usually clears up by tonight or the next day.  Walk with the use of an assistive device or accompanied by an adult for  the 24 hours.  You may use ice on the affected area for the first 24 hours.  Put ice in a Ziploc bag and cover with a towel and place against area 15 minutes on 15 minutes off.  You may switch to heat after 24 hours.

## 2015-06-03 NOTE — Progress Notes (Signed)
Safety precautions to be maintained throughout the outpatient stay will include: orient to surroundings, keep bed in low position, maintain call bell within reach at all times, provide assistance with transfer out of bed and ambulation.  

## 2015-06-04 ENCOUNTER — Telehealth: Payer: Self-pay | Admitting: *Deleted

## 2015-06-04 NOTE — Telephone Encounter (Signed)
Patient verbalizes no complications from procedure on yesterday. 

## 2015-06-09 ENCOUNTER — Encounter: Payer: Medicare Other | Admitting: Occupational Therapy

## 2015-06-10 ENCOUNTER — Encounter: Payer: Self-pay | Admitting: Pain Medicine

## 2015-06-10 ENCOUNTER — Ambulatory Visit: Payer: Medicare Other | Attending: Pain Medicine | Admitting: Pain Medicine

## 2015-06-10 VITALS — BP 119/75 | HR 68 | Temp 97.8°F | Resp 16 | Ht 67.0 in | Wt 145.0 lb

## 2015-06-10 DIAGNOSIS — M8580 Other specified disorders of bone density and structure, unspecified site: Secondary | ICD-10-CM | POA: Insufficient documentation

## 2015-06-10 DIAGNOSIS — M47896 Other spondylosis, lumbar region: Secondary | ICD-10-CM | POA: Diagnosis not present

## 2015-06-10 DIAGNOSIS — M5126 Other intervertebral disc displacement, lumbar region: Secondary | ICD-10-CM | POA: Insufficient documentation

## 2015-06-10 DIAGNOSIS — M545 Low back pain: Secondary | ICD-10-CM | POA: Diagnosis present

## 2015-06-10 DIAGNOSIS — M4806 Spinal stenosis, lumbar region: Secondary | ICD-10-CM | POA: Diagnosis not present

## 2015-06-10 DIAGNOSIS — M5116 Intervertebral disc disorders with radiculopathy, lumbar region: Secondary | ICD-10-CM | POA: Insufficient documentation

## 2015-06-10 DIAGNOSIS — M533 Sacrococcygeal disorders, not elsewhere classified: Secondary | ICD-10-CM | POA: Insufficient documentation

## 2015-06-10 DIAGNOSIS — M4316 Spondylolisthesis, lumbar region: Secondary | ICD-10-CM | POA: Insufficient documentation

## 2015-06-10 DIAGNOSIS — M5136 Other intervertebral disc degeneration, lumbar region: Secondary | ICD-10-CM

## 2015-06-10 DIAGNOSIS — M4186 Other forms of scoliosis, lumbar region: Secondary | ICD-10-CM | POA: Diagnosis not present

## 2015-06-10 DIAGNOSIS — M48061 Spinal stenosis, lumbar region without neurogenic claudication: Secondary | ICD-10-CM

## 2015-06-10 DIAGNOSIS — M79606 Pain in leg, unspecified: Secondary | ICD-10-CM | POA: Diagnosis present

## 2015-06-10 DIAGNOSIS — M5416 Radiculopathy, lumbar region: Secondary | ICD-10-CM

## 2015-06-10 DIAGNOSIS — M47816 Spondylosis without myelopathy or radiculopathy, lumbar region: Secondary | ICD-10-CM

## 2015-06-10 DIAGNOSIS — M48062 Spinal stenosis, lumbar region with neurogenic claudication: Secondary | ICD-10-CM

## 2015-06-10 MED ORDER — OXYCODONE HCL 5 MG PO TABS: ORAL_TABLET | ORAL | Status: DC

## 2015-06-10 NOTE — Patient Instructions (Addendum)
PLAN   Continue present medication oxycodone  Lumbosacral selective nerve root block to be performed at time of return appointment  F/U PCP Dr. Brunetta Genera for evaliation of  BP and general medical  condition  F/U surgical evaluation. May consider pending follow-up evaluations. The patient is with prior surgical evaluation without plans for surgical intervention for lumbar and lower extremity pain and paresthesias. The patient will follow-up with Dr. Mack Guise for follow-up evaluation and treatment of fracture of the left upper extremity  F/U neurological evaluation. May consider pending follow-up evaluations. May consider additional studies . At the present time we will avoid PNCV/EMG studies and other studies  May consider radiofrequency rhizolysis or intraspinal procedures pending response to present treatment and F/U evaluation   Patient to call Pain Management Center should patient have concerns prior to scheduled return appointmentGENERAL RISKS AND COMPLICATIONS  What are the risk, side effects and possible complications? Generally speaking, most procedures are safe.  However, with any procedure there are risks, side effects, and the possibility of complications.  The risks and complications are dependent upon the sites that are lesioned, or the type of nerve block to be performed.  The closer the procedure is to the spine, the more serious the risks are.  Great care is taken when placing the radio frequency needles, block needles or lesioning probes, but sometimes complications can occur. 1. Infection: Any time there is an injection through the skin, there is a risk of infection.  This is why sterile conditions are used for these blocks.  There are four possible types of infection. 1. Localized skin infection. 2. Central Nervous System Infection-This can be in the form of Meningitis, which can be deadly. 3. Epidural Infections-This can be in the form of an epidural abscess, which can cause  pressure inside of the spine, causing compression of the spinal cord with subsequent paralysis. This would require an emergency surgery to decompress, and there are no guarantees that the patient would recover from the paralysis. 4. Discitis-This is an infection of the intervertebral discs.  It occurs in about 1% of discography procedures.  It is difficult to treat and it may lead to surgery.        2. Pain: the needles have to go through skin and soft tissues, will cause soreness.       3. Damage to internal structures:  The nerves to be lesioned may be near blood vessels or    other nerves which can be potentially damaged.       4. Bleeding: Bleeding is more common if the patient is taking blood thinners such as  aspirin, Coumadin, Ticiid, Plavix, etc., or if he/she have some genetic predisposition  such as hemophilia. Bleeding into the spinal canal can cause compression of the spinal  cord with subsequent paralysis.  This would require an emergency surgery to  decompress and there are no guarantees that the patient would recover from the  paralysis.       5. Pneumothorax:  Puncturing of a lung is a possibility, every time a needle is introduced in  the area of the chest or upper back.  Pneumothorax refers to free air around the  collapsed lung(s), inside of the thoracic cavity (chest cavity).  Another two possible  complications related to a similar event would include: Hemothorax and Chylothorax.   These are variations of the Pneumothorax, where instead of air around the collapsed  lung(s), you may have blood or chyle, respectively.  6. Spinal headaches: They may occur with any procedures in the area of the spine.       7. Persistent CSF (Cerebro-Spinal Fluid) leakage: This is a rare problem, but may occur  with prolonged intrathecal or epidural catheters either due to the formation of a fistulous  track or a dural tear.       8. Nerve damage: By working so close to the spinal cord, there is  always a possibility of  nerve damage, which could be as serious as a permanent spinal cord injury with  paralysis.       9. Death:  Although rare, severe deadly allergic reactions known as "Anaphylactic  reaction" can occur to any of the medications used.      10. Worsening of the symptoms:  We can always make thing worse.  What are the chances of something like this happening? Chances of any of this occuring are extremely low.  By statistics, you have more of a chance of getting killed in a motor vehicle accident: while driving to the hospital than any of the above occurring .  Nevertheless, you should be aware that they are possibilities.  In general, it is similar to taking a shower.  Everybody knows that you can slip, hit your head and get killed.  Does that mean that you should not shower again?  Nevertheless always keep in mind that statistics do not mean anything if you happen to be on the wrong side of them.  Even if a procedure has a 1 (one) in a 1,000,000 (million) chance of going wrong, it you happen to be that one..Also, keep in mind that by statistics, you have more of a chance of having something go wrong when taking medications.  Who should not have this procedure? If you are on a blood thinning medication (e.g. Coumadin, Plavix, see list of "Blood Thinners"), or if you have an active infection going on, you should not have the procedure.  If you are taking any blood thinners, please inform your physician.  How should I prepare for this procedure?  Do not eat or drink anything at least six hours prior to the procedure.  Bring a driver with you .  It cannot be a taxi.  Come accompanied by an adult that can drive you back, and that is strong enough to help you if your legs get weak or numb from the local anesthetic.  Take all of your medicines the morning of the procedure with just enough water to swallow them.  If you have diabetes, make sure that you are scheduled to have your  procedure done first thing in the morning, whenever possible.  If you have diabetes, take only half of your insulin dose and notify our nurse that you have done so as soon as you arrive at the clinic.  If you are diabetic, but only take blood sugar pills (oral hypoglycemic), then do not take them on the morning of your procedure.  You may take them after you have had the procedure.  Do not take aspirin or any aspirin-containing medications, at least eleven (11) days prior to the procedure.  They may prolong bleeding.  Wear loose fitting clothing that may be easy to take off and that you would not mind if it got stained with Betadine or blood.  Do not wear any jewelry or perfume  Remove any nail coloring.  It will interfere with some of our monitoring equipment.  NOTE: Remember that this is  not meant to be interpreted as a complete list of all possible complications.  Unforeseen problems may occur.  BLOOD THINNERS The following drugs contain aspirin or other products, which can cause increased bleeding during surgery and should not be taken for 2 weeks prior to and 1 week after surgery.  If you should need take something for relief of minor pain, you may take acetaminophen which is found in Tylenol,m Datril, Anacin-3 and Panadol. It is not blood thinner. The products listed below are.  Do not take any of the products listed below in addition to any listed on your instruction sheet.  A.P.C or A.P.C with Codeine Codeine Phosphate Capsules #3 Ibuprofen Ridaura  ABC compound Congesprin Imuran rimadil  Advil Cope Indocin Robaxisal  Alka-Seltzer Effervescent Pain Reliever and Antacid Coricidin or Coricidin-D  Indomethacin Rufen  Alka-Seltzer plus Cold Medicine Cosprin Ketoprofen S-A-C Tablets  Anacin Analgesic Tablets or Capsules Coumadin Korlgesic Salflex  Anacin Extra Strength Analgesic tablets or capsules CP-2 Tablets Lanoril Salicylate  Anaprox Cuprimine Capsules Levenox Salocol  Anexsia-D  Dalteparin Magan Salsalate  Anodynos Darvon compound Magnesium Salicylate Sine-off  Ansaid Dasin Capsules Magsal Sodium Salicylate  Anturane Depen Capsules Marnal Soma  APF Arthritis pain formula Dewitt's Pills Measurin Stanback  Argesic Dia-Gesic Meclofenamic Sulfinpyrazone  Arthritis Bayer Timed Release Aspirin Diclofenac Meclomen Sulindac  Arthritis pain formula Anacin Dicumarol Medipren Supac  Analgesic (Safety coated) Arthralgen Diffunasal Mefanamic Suprofen  Arthritis Strength Bufferin Dihydrocodeine Mepro Compound Suprol  Arthropan liquid Dopirydamole Methcarbomol with Aspirin Synalgos  ASA tablets/Enseals Disalcid Micrainin Tagament  Ascriptin Doan's Midol Talwin  Ascriptin A/D Dolene Mobidin Tanderil  Ascriptin Extra Strength Dolobid Moblgesic Ticlid  Ascriptin with Codeine Doloprin or Doloprin with Codeine Momentum Tolectin  Asperbuf Duoprin Mono-gesic Trendar  Aspergum Duradyne Motrin or Motrin IB Triminicin  Aspirin plain, buffered or enteric coated Durasal Myochrisine Trigesic  Aspirin Suppositories Easprin Nalfon Trillsate  Aspirin with Codeine Ecotrin Regular or Extra Strength Naprosyn Uracel  Atromid-S Efficin Naproxen Ursinus  Auranofin Capsules Elmiron Neocylate Vanquish  Axotal Emagrin Norgesic Verin  Azathioprine Empirin or Empirin with Codeine Normiflo Vitamin E  Azolid Emprazil Nuprin Voltaren  Bayer Aspirin plain, buffered or children's or timed BC Tablets or powders Encaprin Orgaran Warfarin Sodium  Buff-a-Comp Enoxaparin Orudis Zorpin  Buff-a-Comp with Codeine Equegesic Os-Cal-Gesic   Buffaprin Excedrin plain, buffered or Extra Strength Oxalid   Bufferin Arthritis Strength Feldene Oxphenbutazone   Bufferin plain or Extra Strength Feldene Capsules Oxycodone with Aspirin   Bufferin with Codeine Fenoprofen Fenoprofen Pabalate or Pabalate-SF   Buffets II Flogesic Panagesic   Buffinol plain or Extra Strength Florinal or Florinal with Codeine Panwarfarin    Buf-Tabs Flurbiprofen Penicillamine   Butalbital Compound Four-way cold tablets Penicillin   Butazolidin Fragmin Pepto-Bismol   Carbenicillin Geminisyn Percodan   Carna Arthritis Reliever Geopen Persantine   Carprofen Gold's salt Persistin   Chloramphenicol Goody's Phenylbutazone   Chloromycetin Haltrain Piroxlcam   Clmetidine heparin Plaquenil   Cllnoril Hyco-pap Ponstel   Clofibrate Hydroxy chloroquine Propoxyphen         Before stopping any of these medications, be sure to consult the physician who ordered them.  Some, such as Coumadin (Warfarin) are ordered to prevent or treat serious conditions such as "deep thrombosis", "pumonary embolisms", and other heart problems.  The amount of time that you may need off of the medication may also vary with the medication and the reason for which you were taking it.  If you are taking any of these medications, please make sure  you notify your pain physician before you undergo any procedures.         Selective Nerve Root Block Patient Information  Description: Specific nerve roots exit the spinal canal and these nerves can be compressed and inflamed by a bulging disc and bone spurs.  By injecting steroids on the nerve root, we can potentially decrease the inflammation surrounding these nerves, which often leads to decreased pain.  Also, by injecting local anesthesia on the nerve root, this can provide [place] helpful information to give to your referring doctor if it decreases your pain.  Selective nerve root blocks can be done along the spine from the neck to the low back depending on the location of your pain.   After numbing the skin with local anesthesia, a small needle is passed to the nerve root and the position of the needle is verified using x-ray pictures.  After the needle is in correct position, we then deposit the medication.  You may experience a pressure sensation while this is being done.  The entire block usually lasts less than 15  minutes.  Conditions that may be treated with selective nerve root blocks:  Low back and leg pain  Spinal stenosis  Diagnostic block prior to potential surgery  Neck and arm pain  Post laminectomy syndrome  Preparation for the injection:  1. Do not eat any solid food or dairy products within 8 hours of your appointment. 2. You may drink clear liquids up to 3 hours before an appointment.  Clear liquids include water, black coffee, juice or soda.  No milk or cream please. 3. You may take your regular medications, including pain medications, with a sip of water before your appointment.  Diabetics should hold regular insulin (if taken separately) and take 1/2 normal NPH dose the morning of the procedure.  Carry some sugar containing items with you to your appointment. 4. A driver must accompany you and be prepared to drive you home after your procedure. 5. Bring all your current medications with you. 6. An IV may be inserted and sedation may be given at the discretion of the physician. 7. A blood pressure cuff, EKG, and other monitors will often be applied during the procedure.  Some patients may need to have extra oxygen administered for a short period. 8. You will be asked to provide medical information, including allergies, prior to the procedure.  We must know immediately if you are taking blood  Thinners (like Coumadin) or if you are allergic to IV iodine contrast (dye).  Possible side-effects: All are usually temporary  Bleeding from needle site  Light headedness  Numbness and tingling  Decreased blood pressure  Weakness in arms/legs  Pressure sensation in back/neck  Pain at injection site (several days)  Possible complications: All are extremely rare  Infection  Nerve injury  Spinal headache (a headache wore with upright position)  Call if you experience:  Fever/chills associated with headache or increased back/neck pain  Headache worsened by an upright  position  New onset weakness or numbness of an extremity below the injection site  Hives or difficulty breathing (go to the emergency room)  Inflammation or drainage at the injection site(s)  Severe back/neck pain greater than usual  New symptoms which are concerning to you  Please note:  Although the local anesthetic injected can often make your back or neck feel good for several hours after the injection the pain will likely return.  It takes 3-5 days for steroids to work  on the nerve root. You may not notice any pain relief for at least one week.  If effective, we will often do a series of 3 injections spaced 3-6 weeks apart to maximally decrease your pain.    If you have any questions, please call [phone] [facility]

## 2015-06-10 NOTE — Progress Notes (Signed)
Safety precautions to be maintained throughout the outpatient stay will include: orient to surroundings, keep bed in low position, maintain call bell within reach at all times, provide assistance with transfer out of bed and ambulation.  

## 2015-06-10 NOTE — Progress Notes (Signed)
Subjective:    Patient ID: Kristi [name], female    DOB: [birth year], 75 y.o.   MRN: [id number]  HPI  The patient is a 75 year old female who returns to pain management for further evaluation and treatment of pain involving the lower back and lower extremity region patient states that she has had significant improvement of her pain following lumbosacral selective nerve root block. The patient also admits to improve strength and with less difficulty standing and walking. The patient continues oxycodone as prescribed and has undergone prior surgical evaluation is without plans to consider surgical intervention. We discussed patient's condition and have informed patient to call pain management prior to scheduled return appointment should they be significant change in patient's condition. The patient states the pain is aggravated with prolonged standing walking and that there is weakness after standing or walking and significant period of time. The patient stated that the injection improved the strength of the lower extremity and decreased the pain allowing patient to walk for longer periods of time without experiencing weakness of the lower extremity. We will consider additional treatment as discussed and we will consider surgical reevaluation as discussed. Patient has again stated that she prefers to avoid surgical intervention. All agreed to suggested treatment plan.       Review of Systems     Objective:   Physical Exam  There was tends to palpation of the splenius capitis and occipitalis musculature regions. Palpation over the cervical facet cervical paraspinal musculature region was attends to palpation of mild degree with mild tenderness of the acromioclavicular and glenohumeral joint regions. There was tenderness over the region of the thoracic facet thoracic paraspinal muscles region of mild degree with no crepitus of the thoracic region noted. The patient was with slightly decreased grip  strength and Tinel and Phalen's maneuver were without increase of pain of significant degree. Palpation over the lumbar paraspinal muscular treat and lumbar facet region associated with increased pain with lateral bending rotation extension and palpation over the lumbar facets reproducing mild to moderate discomfort. Palpation over the PSIS and PII S region reproduced mild to moderate discomfort. There was slight increase of pain with pressure applied to the ileum with patient in lateral decubitus position. Palpation of the greater trochanteric region iliotibial band region was with mild discomfort. There was moderate tenderness of the gluteal and piriformis musculature regions.. Straight leg raising was tolerates approximately 20 without a definite increase of pain with dorsiflexion noted. The knees were without excessive tends to palpation and there was negative anterior and posterior drawer signs without ballottement of the patella. EHL strength was decreased. Superficial varicosities of the lower extremities were noted. There was negative clonus negative Homans. No sensory deficit or dermatomal distribution detected. Abdomen nontender with no costovertebral tenderness noted.     Assessment & Plan:    Degenerative disc disease lumbar spine Degenerative changes L2-L3 L3-L4 with disc bulging and L3-4 facet arthropathy, severe spinal canal stenosis L4-L5 disc bulging and multilevel facet arthropathy, osteopenia, scoliosis, severe spinal canal stenosis L4-L5 disc bulging and arthropathy, retrolisthesis on the right L2-3 and L3-4  Lumbar radiculopathy   Lumbar stenosis with neurogenic claudication  Lumbar facet syndrome  Spinal stenosis with neurogenic claudication  Sacroiliac joint dysfunction     PLAN   Continue present medication oxycodone  Lumbosacral selective nerve root block to be performed at time of return appointment  F/U PCP Dr. Brunetta Genera for evaliation of  BP and general  medical  condition  F/U surgical  evaluation. May consider pending follow-up evaluations. The patient is with prior surgical evaluation without plans for surgical intervention for lumbar and lower extremity pain and paresthesias. The patient will follow-up with Dr. Mack Guise for follow-up evaluation and treatment of fracture of the left upper extremity  F/U neurological evaluation. May consider pending follow-up evaluations. May consider additional studies . At the present time we will avoid PNCV/EMG studies and other studies  May consider radiofrequency rhizolysis or intraspinal procedures pending response to present treatment and F/U evaluation   Patient to call Pain Management Center should patient have concerns prior to scheduled return appointment

## 2015-06-12 ENCOUNTER — Ambulatory Visit: Payer: Medicare Other | Admitting: Occupational Therapy

## 2015-07-08 ENCOUNTER — Ambulatory Visit: Payer: Medicare Other | Attending: Pain Medicine | Admitting: Pain Medicine

## 2015-07-08 ENCOUNTER — Encounter: Payer: Self-pay | Admitting: Pain Medicine

## 2015-07-08 VITALS — BP 130/76 | HR 72 | Temp 97.0°F | Resp 16 | Ht 67.0 in | Wt 145.0 lb

## 2015-07-08 DIAGNOSIS — M4186 Other forms of scoliosis, lumbar region: Secondary | ICD-10-CM | POA: Insufficient documentation

## 2015-07-08 DIAGNOSIS — M545 Low back pain: Secondary | ICD-10-CM | POA: Diagnosis present

## 2015-07-08 DIAGNOSIS — M1288 Other specific arthropathies, not elsewhere classified, other specified site: Secondary | ICD-10-CM | POA: Insufficient documentation

## 2015-07-08 DIAGNOSIS — M4806 Spinal stenosis, lumbar region: Secondary | ICD-10-CM | POA: Diagnosis not present

## 2015-07-08 DIAGNOSIS — M51369 Other intervertebral disc degeneration, lumbar region without mention of lumbar back pain or lower extremity pain: Secondary | ICD-10-CM

## 2015-07-08 DIAGNOSIS — M533 Sacrococcygeal disorders, not elsewhere classified: Secondary | ICD-10-CM

## 2015-07-08 DIAGNOSIS — M47816 Spondylosis without myelopathy or radiculopathy, lumbar region: Secondary | ICD-10-CM | POA: Diagnosis not present

## 2015-07-08 DIAGNOSIS — M5136 Other intervertebral disc degeneration, lumbar region: Secondary | ICD-10-CM | POA: Diagnosis not present

## 2015-07-08 DIAGNOSIS — M8580 Other specified disorders of bone density and structure, unspecified site: Secondary | ICD-10-CM | POA: Diagnosis not present

## 2015-07-08 DIAGNOSIS — M5126 Other intervertebral disc displacement, lumbar region: Secondary | ICD-10-CM | POA: Diagnosis not present

## 2015-07-08 DIAGNOSIS — M48062 Spinal stenosis, lumbar region with neurogenic claudication: Secondary | ICD-10-CM

## 2015-07-08 DIAGNOSIS — M5416 Radiculopathy, lumbar region: Secondary | ICD-10-CM

## 2015-07-08 DIAGNOSIS — M48061 Spinal stenosis, lumbar region without neurogenic claudication: Secondary | ICD-10-CM

## 2015-07-08 DIAGNOSIS — M79606 Pain in leg, unspecified: Secondary | ICD-10-CM | POA: Diagnosis present

## 2015-07-08 MED ORDER — LIDOCAINE HCL (PF) 1 % IJ SOLN: 10.0000 mL | Freq: Once | INTRAMUSCULAR | Status: DC

## 2015-07-08 MED ORDER — FENTANYL CITRATE (PF) 100 MCG/2ML IJ SOLN: 100.0000 ug | Freq: Once | INTRAMUSCULAR | Status: DC

## 2015-07-08 MED ORDER — CIPROFLOXACIN IN D5W 400 MG/200ML IV SOLN: 400.0000 mg | Freq: Once | INTRAVENOUS | Status: DC

## 2015-07-08 MED ORDER — OXYCODONE HCL 5 MG PO TABS: ORAL_TABLET | ORAL | Status: DC

## 2015-07-08 MED ORDER — FENTANYL CITRATE (PF) 100 MCG/2ML IJ SOLN
INTRAMUSCULAR | Status: AC
  Administered 2015-07-08: 100 ug via INTRAVENOUS
  Filled 2015-07-08: qty 2

## 2015-07-08 MED ORDER — ORPHENADRINE CITRATE 30 MG/ML IJ SOLN: 60.0000 mg | Freq: Once | INTRAMUSCULAR | Status: DC

## 2015-07-08 MED ORDER — MIDAZOLAM HCL 5 MG/5ML IJ SOLN
INTRAMUSCULAR | Status: AC
  Administered 2015-07-08: 2 mg via INTRAVENOUS
  Filled 2015-07-08: qty 5

## 2015-07-08 MED ORDER — LACTATED RINGERS IV SOLN: 1000.0000 mL | INTRAVENOUS | Status: DC

## 2015-07-08 MED ORDER — BUPIVACAINE HCL (PF) 0.25 % IJ SOLN: 30.0000 mL | Freq: Once | INTRAMUSCULAR | Status: DC

## 2015-07-08 MED ORDER — CIPROFLOXACIN HCL 250 MG PO TABS: 250.0000 mg | ORAL_TABLET | Freq: Two times a day (BID) | ORAL | Status: DC

## 2015-07-08 MED ORDER — ORPHENADRINE CITRATE 30 MG/ML IJ SOLN
INTRAMUSCULAR | Status: AC
  Filled 2015-07-08: qty 2

## 2015-07-08 MED ORDER — TRIAMCINOLONE ACETONIDE 40 MG/ML IJ SUSP
INTRAMUSCULAR | Status: AC
  Administered 2015-07-08: 11:00:00
  Filled 2015-07-08: qty 1

## 2015-07-08 MED ORDER — LIDOCAINE HCL (PF) 1 % IJ SOLN
INTRAMUSCULAR | Status: AC
  Filled 2015-07-08: qty 5

## 2015-07-08 MED ORDER — MIDAZOLAM HCL 5 MG/5ML IJ SOLN: 5.0000 mg | Freq: Once | INTRAMUSCULAR | Status: DC

## 2015-07-08 MED ORDER — CIPROFLOXACIN IN D5W 400 MG/200ML IV SOLN
INTRAVENOUS | Status: AC
  Administered 2015-07-08: 400 mg via INTRAVENOUS
  Filled 2015-07-08: qty 200

## 2015-07-08 MED ORDER — BUPIVACAINE HCL (PF) 0.25 % IJ SOLN
INTRAMUSCULAR | Status: AC
  Administered 2015-07-08: 11:00:00
  Filled 2015-07-08: qty 30

## 2015-07-08 MED ORDER — TRIAMCINOLONE ACETONIDE 40 MG/ML IJ SUSP: 40.0000 mg | Freq: Once | INTRAMUSCULAR | Status: DC

## 2015-07-08 NOTE — Progress Notes (Signed)
Subjective:    Patient ID: Kristi [name], female    DOB: July 17, 1940, 75 y.o.   MRN: [id number]  HPI  PROCEDURE PERFORMED: Lumbosacral selective nerve root block   NOTE: The patient is a 75 y.o. female who returns to [place] for further evaluation and treatment of pain involving the lumbar and lower extremity region. Studies consisting of MRI has revealed the patient to be with evidence of degenerative disc disease lumbar spine Degenerative changes L2-L3 L3-L4 with disc bulging and L3-4 facet arthropathy, severe spinal canal stenosis L4-L5 disc bulging and multilevel facet arthropathy, osteopenia, scoliosis, severe spinal canal stenosis L4-L5 disc bulging and arthropathy, retrolisthesis on the right L2-3 and L3-4. There is concern regarding intraspinal abnormalities contributing to the patient's symptomatology with concern regarding significant component of patient's pain began due to lumbar radiculopathy The risks, benefits, and expectations of the procedure have been explained to the patient who was understanding and in agreement with suggested treatment plan. We will proceed with interventional treatment as discussed and as explained to the patient. The patient is understanding and in agreement with suggested treatment plan.   DESCRIPTION OF PROCEDURE: Lumbosacral selective nerve root block with IV Versed, IV fentanyl conscious sedation, EKG, blood pressure, pulse, capnography, and pulse oximetry monitoring. The procedure was performed with the patient in the prone position under fluoroscopic guidance. With the patient in the prone position, Betadine prep of proposed entry site was performed. Local anesthetic skin wheal of proposed needle entry site was prepared with 1.5% plain lidocaine with AP view of the lumbosacral spine.   PROCEDURE #1: Needle placement at the right L 2 vertebral body: A 22 -gauge needle was inserted at the inferior border of the transverse process of the  vertebral body with needle placed medial to the midline of the transverse process on AP view of the lumbosacral spine.   NEEDLE PLACEMENT AT  L3, L4, and L5  VERTEBRAL BODY LEVELS  Needle  placement was accomplished at L3, L4, and L5  vertebral body levels on the right side exactly as was accomplished at the L2  vertebral body level  and utilizing the same technique and under fluoroscopic guidance.   Needle placement was then verified on lateral view at all levels with needle tip documented to be in the posterior superior quadrant of the intervertebral foramen of  L 2, L3, L4, and L5. Following negative aspiration for heme and CSF at each level, each level was injected with 3 mL of 0.25% bupivacaine with Kenalog.   The patient tolerated the procedure well.   A total of 10 mg of Kenalog was utilized for the procedure.   PLAN:  1. Medications: Will continue presently prescribed medication oxycodone 2. The patient is to undergo follow-up evaluation with PCP Dr. Alice Reichert for evaluation of blood pressure and general medical condition status post procedure performed on today's visit. 3. Surgical follow-up evaluation. The patient is without plans for surgical intervention 4. Neurological evaluation. Has been addressed. Patient prefers to avoid PNCV EMG studies and other studies at this time 5. May consider radiofrequency procedures, implantation type procedures and other treatment pending response to treatment and follow-up evaluation. 6. The patient has been advise do adhere to proper body mechanics and avoid activities which may aggravate condition. 7. The patient has been advised to call the Pain Management Center prior to scheduled return appointment should there be significant change in the patient's condition or should the patient have other concerns regarding condition prior to scheduled  return appointment.   Review of Systems     Objective:   Physical Exam        Assessment & Plan:

## 2015-07-08 NOTE — Patient Instructions (Signed)
PLAN  Continue present medication Cipro and begin taking antibiotic as prescribed. Please obtain your antibiotic Cipro today  and begin taking antibiotic today  F/U PCP Dr Niemeyer for evaliation of  BP and general medical  condition.  F/U surgical evaluation. May consider pending follow-up evaluations  F/U neurological evaluation. May consider pending follow-up evaluations  May consider radiofrequency rhizolysis or intraspinal procedures pending response to present treatment and F/U evaluation.  Patient to call Pain Management Center should patient have concerns prior to scheduled return appointment.   

## 2015-07-08 NOTE — Progress Notes (Signed)
Safety precautions to be maintained throughout the outpatient stay will include: orient to surroundings, keep bed in low position, maintain call bell within reach at all times, provide assistance with transfer out of bed and ambulation.  

## 2015-07-09 ENCOUNTER — Telehealth: Payer: Self-pay | Admitting: *Deleted

## 2015-07-09 NOTE — Telephone Encounter (Signed)
Spoke with patient re; procedure, patient was unclear as to how long it would take for benefits of the procedure.  Explanation given of steroids taking 5 -7 days to be completely effective and the site may be sore where the needle stick occurred for which she could use ice or move to heat at the 24 hour mark.  Patient verbalizes u/o information.

## 2015-08-04 ENCOUNTER — Ambulatory Visit: Payer: Medicare Other | Attending: Pain Medicine | Admitting: Pain Medicine

## 2015-08-04 ENCOUNTER — Encounter: Payer: Self-pay | Admitting: Pain Medicine

## 2015-08-04 VITALS — BP 129/79 | HR 79 | Temp 97.8°F | Resp 17 | Ht 67.0 in | Wt 145.0 lb

## 2015-08-04 DIAGNOSIS — M5126 Other intervertebral disc displacement, lumbar region: Secondary | ICD-10-CM | POA: Insufficient documentation

## 2015-08-04 DIAGNOSIS — M533 Sacrococcygeal disorders, not elsewhere classified: Secondary | ICD-10-CM | POA: Diagnosis not present

## 2015-08-04 DIAGNOSIS — M4806 Spinal stenosis, lumbar region: Secondary | ICD-10-CM | POA: Insufficient documentation

## 2015-08-04 DIAGNOSIS — M5136 Other intervertebral disc degeneration, lumbar region: Secondary | ICD-10-CM

## 2015-08-04 DIAGNOSIS — M419 Scoliosis, unspecified: Secondary | ICD-10-CM | POA: Insufficient documentation

## 2015-08-04 DIAGNOSIS — M1288 Other specific arthropathies, not elsewhere classified, other specified site: Secondary | ICD-10-CM | POA: Insufficient documentation

## 2015-08-04 DIAGNOSIS — M48062 Spinal stenosis, lumbar region with neurogenic claudication: Secondary | ICD-10-CM

## 2015-08-04 DIAGNOSIS — M4316 Spondylolisthesis, lumbar region: Secondary | ICD-10-CM | POA: Insufficient documentation

## 2015-08-04 DIAGNOSIS — M48061 Spinal stenosis, lumbar region without neurogenic claudication: Secondary | ICD-10-CM

## 2015-08-04 DIAGNOSIS — M5416 Radiculopathy, lumbar region: Secondary | ICD-10-CM

## 2015-08-04 DIAGNOSIS — M545 Low back pain: Secondary | ICD-10-CM | POA: Diagnosis present

## 2015-08-04 DIAGNOSIS — M5116 Intervertebral disc disorders with radiculopathy, lumbar region: Secondary | ICD-10-CM | POA: Diagnosis not present

## 2015-08-04 DIAGNOSIS — M8580 Other specified disorders of bone density and structure, unspecified site: Secondary | ICD-10-CM | POA: Diagnosis not present

## 2015-08-04 DIAGNOSIS — M79606 Pain in leg, unspecified: Secondary | ICD-10-CM | POA: Diagnosis present

## 2015-08-04 DIAGNOSIS — M47816 Spondylosis without myelopathy or radiculopathy, lumbar region: Secondary | ICD-10-CM

## 2015-08-04 MED ORDER — OXYCODONE HCL 5 MG PO TABS: ORAL_TABLET | ORAL | Status: DC

## 2015-08-04 NOTE — Patient Instructions (Signed)
PLAN   Continue present medication oxycodone  F/U PCP Dr. Brunetta Genera for evaliation of  BP and general medical  condition  F/U surgical evaluation. May consider pending follow-up evaluations. The patient is with prior surgical evaluation without plans for surgical intervention for lumbar and lower extremity pain and paresthesias. The patient will follow-up with  Dr. Mack Guise for follow-up evaluation and treatment of fracture of the left upper extremity as needed  F/U neurological evaluation. May consider pending follow-up evaluations. May consider additional studies . At the present time we will avoid PNCV/EMG studies and other studies  May consider radiofrequency rhizolysis or intraspinal procedures pending response to present treatment and F/U evaluation   Patient to call Pain Management Center should patient have concerns prior to scheduled return appointment

## 2015-08-04 NOTE — Progress Notes (Signed)
   Subjective:    Patient ID: Kristi [name], female    DOB: August 31, 1940, 75 y.o.   MRN: [id number]  HPI  The patient is a 75 year old female who returns to pain management for further evaluation and treatment of pain involving the lower back and lower extremity region. The patient has had significant improvement of her lower back lower extremity pain paresthesias and weakness since previous procedure performed in pain management center. The patient continues oxycodone at this time and denies any drowsiness confusion or other undesirable side effects due to the medication. The patient is undergone prior surgical evaluation and without plans for surgical intervention. We will continue present medications at this time and we'll remain available to consider patient for modification of treatment regimen pending follow-up evaluation. All agreed to suggested treatment plan.     Review of Systems     Objective:   Physical Exam  There was tenderness to palpation of paraspinal muscular treat and cervical and cervical facet region a mild degree with mild tenderness of the splenius capitis and occipitalis musculature region. Palpation over the thoracic region thoracic facet region was with minimal tenderness to palpation with no crepitus of the thoracic region noted. Palpation of the acromioclavicular and glenohumeral joint regions reproduces minimal discomfort as well. The patient appeared to be with unremarkable Spurling's maneuver. Tinel and Phalen's maneuver were without increased pain of significant degree. There was decreased grip strength of mild degree and patient was with tremor of the upper extremity. There was tenderness over the thoracic facet thoracic paraspinal musculature region of the lower thoracic region of moderate degree. Palpation over the lumbar paraspinal musculatures and lumbar facet region associated with moderate discomfort with lateral bending rotation extension and palpation over the  lumbar facets reproducing moderate discomfort. Straight leg raising was tolerates approximately 30 without increase of pain with dorsiflexion noted. There was decreased EHL strength. No definite sensory deficit or dermatomal distribution detected. There was tenderness over the region of the greater trochanteric region with mild to moderate tenderness of the PSIS and PII S region. Abdomen was nontender with no costovertebral tenderness noted      Assessment & Plan:     Degenerative disc disease lumbar spine Degenerative changes L2-L3 L3-L4 with disc bulging and L3-4 facet arthropathy, severe spinal canal stenosis L4-L5 disc bulging and multilevel facet arthropathy, osteopenia, scoliosis, severe spinal canal stenosis L4-L5 disc bulging and arthropathy, retrolisthesis on the right L2-3 and L3-4  Lumbar radiculopathy   Lumbar stenosis with neurogenic claudication  Lumbar facet syndrome  Spinal stenosis with neurogenic claudication  Sacroiliac joint dysfunction     PLAN   Continue present medication oxycodone  F/U PCP Dr. Brunetta Genera for evaliation of  BP and general medical  condition  F/U surgical evaluation. May consider pending follow-up evaluations. The patient is with prior surgical evaluation without plans for surgical intervention for lumbar and lower extremity pain and paresthesias. The patient will follow-up with Dr. Mack Guise for follow-up evaluation and treatment of fracture of the left upper extremity as needed  F/U neurological evaluation. May consider pending follow-up evaluations. May consider additional studies . At the present time we will avoid PNCV/EMG studies and other studies  May consider radiofrequency rhizolysis or intraspinal procedures pending response to present treatment and F/U evaluation   Patient to call Pain Management Center should patient have concerns prior to scheduled return appointment

## 2015-08-11 LAB — TOXASSURE SELECT 13 (MW), URINE

## 2015-08-11 NOTE — Progress Notes (Signed)
Quick Note:  Reviewed. ______ 

## 2015-09-01 ENCOUNTER — Ambulatory Visit: Payer: Medicare Other | Attending: Pain Medicine | Admitting: Pain Medicine

## 2015-09-01 ENCOUNTER — Encounter: Payer: Self-pay | Admitting: Pain Medicine

## 2015-09-01 VITALS — BP 119/80 | HR 87 | Temp 97.8°F | Resp 15 | Ht 67.0 in | Wt 145.0 lb

## 2015-09-01 DIAGNOSIS — M4806 Spinal stenosis, lumbar region: Secondary | ICD-10-CM | POA: Diagnosis not present

## 2015-09-01 DIAGNOSIS — M48062 Spinal stenosis, lumbar region with neurogenic claudication: Secondary | ICD-10-CM

## 2015-09-01 DIAGNOSIS — M545 Low back pain: Secondary | ICD-10-CM | POA: Diagnosis present

## 2015-09-01 DIAGNOSIS — M79604 Pain in right leg: Secondary | ICD-10-CM | POA: Diagnosis present

## 2015-09-01 DIAGNOSIS — M5136 Other intervertebral disc degeneration, lumbar region: Secondary | ICD-10-CM

## 2015-09-01 DIAGNOSIS — M4316 Spondylolisthesis, lumbar region: Secondary | ICD-10-CM | POA: Diagnosis not present

## 2015-09-01 DIAGNOSIS — M8580 Other specified disorders of bone density and structure, unspecified site: Secondary | ICD-10-CM | POA: Insufficient documentation

## 2015-09-01 DIAGNOSIS — M51369 Other intervertebral disc degeneration, lumbar region without mention of lumbar back pain or lower extremity pain: Secondary | ICD-10-CM

## 2015-09-01 DIAGNOSIS — M47896 Other spondylosis, lumbar region: Secondary | ICD-10-CM | POA: Diagnosis not present

## 2015-09-01 DIAGNOSIS — M5416 Radiculopathy, lumbar region: Secondary | ICD-10-CM

## 2015-09-01 DIAGNOSIS — M4186 Other forms of scoliosis, lumbar region: Secondary | ICD-10-CM | POA: Diagnosis not present

## 2015-09-01 DIAGNOSIS — M5126 Other intervertebral disc displacement, lumbar region: Secondary | ICD-10-CM | POA: Diagnosis not present

## 2015-09-01 DIAGNOSIS — M48061 Spinal stenosis, lumbar region without neurogenic claudication: Secondary | ICD-10-CM

## 2015-09-01 DIAGNOSIS — M79605 Pain in left leg: Secondary | ICD-10-CM | POA: Diagnosis present

## 2015-09-01 DIAGNOSIS — M47816 Spondylosis without myelopathy or radiculopathy, lumbar region: Secondary | ICD-10-CM

## 2015-09-01 DIAGNOSIS — M5116 Intervertebral disc disorders with radiculopathy, lumbar region: Secondary | ICD-10-CM | POA: Diagnosis not present

## 2015-09-01 DIAGNOSIS — M533 Sacrococcygeal disorders, not elsewhere classified: Secondary | ICD-10-CM | POA: Diagnosis not present

## 2015-09-01 MED ORDER — OXYCODONE HCL 5 MG PO TABS: ORAL_TABLET | ORAL | Status: DC

## 2015-09-01 NOTE — Patient Instructions (Addendum)
PLAN   Continue present medication oxycodone. Please discuss decreasing and discontinuing the use of Xanax with Dr. Brunetta Genera  Lumbosacral selective nerve root block to be performed at time of return appointment  F/U PCP Dr. Brunetta Genera for evaliation of  BP, decreasing and discontinuing Xanax, and general medical  condition  F/U surgical evaluation. May consider pending follow-up evaluations. The patient is with prior surgical evaluation without plans for surgical intervention for lumbar and lower extremity pain and paresthesias. The patient will follow-up with Dr. Mack Guise for follow-up evaluation and treatment of fracture of the left upper extremity as needed  F/U neurological evaluation. May consider pending follow-up evaluations. May consider additional studies . At the present time we will avoid PNCV/EMG studies and other studies  May consider radiofrequency rhizolysis or intraspinal procedures pending response to present treatment and F/U evaluation   Patient to call [facility] should patient have concerns prior to scheduled return appointmentGENERAL RISKS AND COMPLICATIONS  What are the risk, side effects and possible complications? Generally speaking, most procedures are safe.  However, with any procedure there are risks, side effects, and the possibility of complications.  The risks and complications are dependent upon the sites that are lesioned, or the type of nerve block to be performed.  The closer the procedure is to the spine, the more serious the risks are.  Great care is taken when placing the radio frequency needles, block needles or lesioning probes, but sometimes complications can occur. 1. Infection: Any time there is an injection through the skin, there is a risk of infection.  This is why sterile conditions are used for these blocks.  There are four possible types of infection. 1. Localized skin infection. 2. Central Nervous System Infection-This can be in the  form of Meningitis, which can be deadly. 3. Epidural Infections-This can be in the form of an epidural abscess, which can cause pressure inside of the spine, causing compression of the spinal cord with subsequent paralysis. This would require an emergency surgery to decompress, and there are no guarantees that the patient would recover from the paralysis. 4. Discitis-This is an infection of the intervertebral discs.  It occurs in about 1% of discography procedures.  It is difficult to treat and it may lead to surgery.        2. Pain: the needles have to go through skin and soft tissues, will cause soreness.       3. Damage to internal structures:  The nerves to be lesioned may be near blood vessels or    other nerves which can be potentially damaged.       4. Bleeding: Bleeding is more common if the patient is taking blood thinners such as  aspirin, Coumadin, Ticiid, Plavix, etc., or if he/she have some genetic predisposition  such as hemophilia. Bleeding into the spinal canal can cause compression of the spinal  cord with subsequent paralysis.  This would require an emergency surgery to  decompress and there are no guarantees that the patient would recover from the  paralysis.       5. Pneumothorax:  Puncturing of a lung is a possibility, every time a needle is introduced in  the area of the chest or upper back.  Pneumothorax refers to free air around the  collapsed lung(s), inside of the thoracic cavity (chest cavity).  Another two possible  complications related to a similar event would include: Hemothorax and Chylothorax.   These are variations of the Pneumothorax, where instead of  air around the collapsed  lung(s), you may have blood or chyle, respectively.       6. Spinal headaches: They may occur with any procedures in the area of the spine.       7. Persistent CSF (Cerebro-Spinal Fluid) leakage: This is a rare problem, but may occur  with prolonged intrathecal or epidural catheters either due to  the formation of a fistulous  track or a dural tear.       8. Nerve damage: By working so close to the spinal cord, there is always a possibility of  nerve damage, which could be as serious as a permanent spinal cord injury with  paralysis.       9. Death:  Although rare, severe deadly allergic reactions known as "Anaphylactic  reaction" can occur to any of the medications used.      10. Worsening of the symptoms:  We can always make thing worse.  What are the chances of something like this happening? Chances of any of this occuring are extremely low.  By statistics, you have more of a chance of getting killed in a motor vehicle accident: while driving to the hospital than any of the above occurring .  Nevertheless, you should be aware that they are possibilities.  In general, it is similar to taking a shower.  Everybody knows that you can slip, hit your head and get killed.  Does that mean that you should not shower again?  Nevertheless always keep in mind that statistics do not mean anything if you happen to be on the wrong side of them.  Even if a procedure has a 1 (one) in a 1,000,000 (million) chance of going wrong, it you happen to be that one..Also, keep in mind that by statistics, you have more of a chance of having something go wrong when taking medications.  Who should not have this procedure? If you are on a blood thinning medication (e.g. Coumadin, Plavix, see list of "Blood Thinners"), or if you have an active infection going on, you should not have the procedure.  If you are taking any blood thinners, please inform your physician.  How should I prepare for this procedure?  Do not eat or drink anything at least six hours prior to the procedure.  Bring a driver with you .  It cannot be a taxi.  Come accompanied by an adult that can drive you back, and that is strong enough to help you if your legs get weak or numb from the local anesthetic.  Take all of your medicines the morning of  the procedure with just enough water to swallow them.  If you have diabetes, make sure that you are scheduled to have your procedure done first thing in the morning, whenever possible.  If you have diabetes, take only half of your insulin dose and notify our nurse that you have done so as soon as you arrive at the clinic.  If you are diabetic, but only take blood sugar pills (oral hypoglycemic), then do not take them on the morning of your procedure.  You may take them after you have had the procedure.  Do not take aspirin or any aspirin-containing medications, at least eleven (11) days prior to the procedure.  They may prolong bleeding.  Wear loose fitting clothing that may be easy to take off and that you would not mind if it got stained with Betadine or blood.  Do not wear any jewelry or perfume  Remove  any nail coloring.  It will interfere with some of our monitoring equipment.  NOTE: Remember that this is not meant to be interpreted as a complete list of all possible complications.  Unforeseen problems may occur.  BLOOD THINNERS The following drugs contain aspirin or other products, which can cause increased bleeding during surgery and should not be taken for 2 weeks prior to and 1 week after surgery.  If you should need take something for relief of minor pain, you may take acetaminophen which is found in Tylenol,m Datril, Anacin-3 and Panadol. It is not blood thinner. The products listed below are.  Do not take any of the products listed below in addition to any listed on your instruction sheet.  A.P.C or A.P.C with Codeine Codeine Phosphate Capsules #3 Ibuprofen Ridaura  ABC compound Congesprin Imuran rimadil  Advil Cope Indocin Robaxisal  Alka-Seltzer Effervescent Pain Reliever and Antacid Coricidin or Coricidin-D  Indomethacin Rufen  Alka-Seltzer plus Cold Medicine Cosprin Ketoprofen S-A-C Tablets  Anacin Analgesic Tablets or Capsules Coumadin Korlgesic Salflex  Anacin Extra  Strength Analgesic tablets or capsules CP-2 Tablets Lanoril Salicylate  Anaprox Cuprimine Capsules Levenox Salocol  Anexsia-D Dalteparin Magan Salsalate  Anodynos Darvon compound Magnesium Salicylate Sine-off  Ansaid Dasin Capsules Magsal Sodium Salicylate  Anturane Depen Capsules Marnal Soma  APF Arthritis pain formula Dewitt's Pills Measurin Stanback  Argesic Dia-Gesic Meclofenamic Sulfinpyrazone  Arthritis Bayer Timed Release Aspirin Diclofenac Meclomen Sulindac  Arthritis pain formula Anacin Dicumarol Medipren Supac  Analgesic (Safety coated) Arthralgen Diffunasal Mefanamic Suprofen  Arthritis Strength Bufferin Dihydrocodeine Mepro Compound Suprol  Arthropan liquid Dopirydamole Methcarbomol with Aspirin Synalgos  ASA tablets/Enseals Disalcid Micrainin Tagament  Ascriptin Doan's Midol Talwin  Ascriptin A/D Dolene Mobidin Tanderil  Ascriptin Extra Strength Dolobid Moblgesic Ticlid  Ascriptin with Codeine Doloprin or Doloprin with Codeine Momentum Tolectin  Asperbuf Duoprin Mono-gesic Trendar  Aspergum Duradyne Motrin or Motrin IB Triminicin  Aspirin plain, buffered or enteric coated Durasal Myochrisine Trigesic  Aspirin Suppositories Easprin Nalfon Trillsate  Aspirin with Codeine Ecotrin Regular or Extra Strength Naprosyn Uracel  Atromid-S Efficin Naproxen Ursinus  Auranofin Capsules Elmiron Neocylate Vanquish  Axotal Emagrin Norgesic Verin  Azathioprine Empirin or Empirin with Codeine Normiflo Vitamin E  Azolid Emprazil Nuprin Voltaren  Bayer Aspirin plain, buffered or children's or timed BC Tablets or powders Encaprin Orgaran Warfarin Sodium  Buff-a-Comp Enoxaparin Orudis Zorpin  Buff-a-Comp with Codeine Equegesic Os-Cal-Gesic   Buffaprin Excedrin plain, buffered or Extra Strength Oxalid   Bufferin Arthritis Strength Feldene Oxphenbutazone   Bufferin plain or Extra Strength Feldene Capsules Oxycodone with Aspirin   Bufferin with Codeine Fenoprofen Fenoprofen Pabalate or  Pabalate-SF   Buffets II Flogesic Panagesic   Buffinol plain or Extra Strength Florinal or Florinal with Codeine Panwarfarin   Buf-Tabs Flurbiprofen Penicillamine   Butalbital Compound Four-way cold tablets Penicillin   Butazolidin Fragmin Pepto-Bismol   Carbenicillin Geminisyn Percodan   Carna Arthritis Reliever Geopen Persantine   Carprofen Gold's salt Persistin   Chloramphenicol Goody's Phenylbutazone   Chloromycetin Haltrain Piroxlcam   Clmetidine heparin Plaquenil   Cllnoril Hyco-pap Ponstel   Clofibrate Hydroxy chloroquine Propoxyphen         Before stopping any of these medications, be sure to consult the physician who ordered them.  Some, such as Coumadin (Warfarin) are ordered to prevent or treat serious conditions such as "deep thrombosis", "pumonary embolisms", and other heart problems.  The amount of time that you may need off of the medication may also vary with the medication and the  reason for which you were taking it.  If you are taking any of these medications, please make sure you notify your pain physician before you undergo any procedures.         Selective Nerve Root Block Patient Information  Description: Specific nerve roots exit the spinal canal and these nerves can be compressed and inflamed by a bulging disc and bone spurs.  By injecting steroids on the nerve root, we can potentially decrease the inflammation surrounding these nerves, which often leads to decreased pain.  Also, by injecting local anesthesia on the nerve root, this can provide [place] helpful information to give to your referring doctor if it decreases your pain.  Selective nerve root blocks can be done along the spine from the neck to the low back depending on the location of your pain.   After numbing the skin with local anesthesia, a small needle is passed to the nerve root and the position of the needle is verified using x-ray pictures.  After the needle is in correct position, we then deposit the  medication.  You may experience a pressure sensation while this is being done.  The entire block usually lasts less than 15 minutes.  Conditions that may be treated with selective nerve root blocks:  Low back and leg pain  Spinal stenosis  Diagnostic block prior to potential surgery  Neck and arm pain  Post laminectomy syndrome  Preparation for the injection:  1. Do not eat any solid food or dairy products within 8 hours of your appointment. 2. You may drink clear liquids up to 3 hours before an appointment.  Clear liquids include water, black coffee, juice or soda.  No milk or cream please. 3. You may take your regular medications, including pain medications, with a sip of water before your appointment.  Diabetics should hold regular insulin (if taken separately) and take 1/2 normal NPH dose the morning of the procedure.  Carry some sugar containing items with you to your appointment. 4. A driver must accompany you and be prepared to drive you home after your procedure. 5. Bring all your current medications with you. 6. An IV may be inserted and sedation may be given at the discretion of the physician. 7. A blood pressure cuff, EKG, and other monitors will often be applied during the procedure.  Some patients may need to have extra oxygen administered for a short period. 8. You will be asked to provide medical information, including allergies, prior to the procedure.  We must know immediately if you are taking blood  Thinners (like Coumadin) or if you are allergic to IV iodine contrast (dye).  Possible side-effects: All are usually temporary  Bleeding from needle site  Light headedness  Numbness and tingling  Decreased blood pressure  Weakness in arms/legs  Pressure sensation in back/neck  Pain at injection site (several days)  Possible complications: All are extremely rare  Infection  Nerve injury  Spinal headache (a headache wore with upright position)  Call  if you experience:  Fever/chills associated with headache or increased back/neck pain  Headache worsened by an upright position  New onset weakness or numbness of an extremity below the injection site  Hives or difficulty breathing (go to the emergency room)  Inflammation or drainage at the injection site(s)  Severe back/neck pain greater than usual  New symptoms which are concerning to you  Please note:  Although the local anesthetic injected can often make your back or neck feel good for  several hours after the injection the pain will likely return.  It takes 3-5 days for steroids to work on the nerve root. You may not notice any pain relief for at least one week.  If effective, we will often do a series of 3 injections spaced 3-6 weeks apart to maximally decrease your pain.    If you have any questions, please call [phone] [facility] Clinic[facility]ive [facility] Root Block Patient Information  Description: Specific nerve roots exit the spinal canal and these nerves can be compressed and inflamed by a bulging disc and bone spurs.  By injecting steroids on the nerve root, we can potentially decrease the inflammation surrounding these nerves, which often leads to decreased pain.  Also, by injecting local anesthesia on the nerve root, this can provide [place] helpful information to give to your referring doctor if it decreases your pain.  Selective nerve root blocks can be done along the spine from the neck to the low back depending on the location of your pain.   After numbing the skin with local anesthesia, a small needle is passed to the nerve root and the position of the needle is verified using x-ray pictures.  After the needle is in correct position, we then deposit the medication.  You may experience a pressure sensation while this is being done.  The entire block usually lasts less than 15 minutes.  Conditions that may be treated with selective nerve root  blocks:  Low back and leg pain  Spinal stenosis  Diagnostic block prior to potential surgery  Neck and arm pain  Post laminectomy syndrome  Preparation for the injection:  9. Do not eat any solid food or dairy products within 8 hours of your appointment. 10. You may drink clear liquids up to 3 hours before an appointment.  Clear liquids include water, black coffee, juice or soda.  No milk or cream please. 11. You may take your regular medications, including pain medications, with a sip of water before your appointment.  Diabetics should hold regular insulin (if taken separately) and take 1/2 normal NPH dose the morning of the procedure.  Carry some sugar containing items with you to your appointment. 12. A driver must accompany you and be prepared to drive you home after your procedure. 60. Bring all your current medications with you. 14. An IV may be inserted and sedation may be given at the discretion of the physician. 15. A blood pressure cuff, EKG, and other monitors will often be applied during the procedure.  Some patients may need to have extra oxygen administered for a short period. 58. You will be asked to provide medical information, including allergies, prior to the procedure.  We must know immediately if you are taking blood  Thinners (like Coumadin) or if you are allergic to IV iodine contrast (dye).  Possible side-effects: All are usually temporary  Bleeding from needle site  Light headedness  Numbness and tingling  Decreased blood pressure  Weakness in arms/legs  Pressure sensation in back/neck  Pain at injection site (several days)  Possible complications: All are extremely rare  Infection  Nerve injury  Spinal headache (a headache wore with upright position)  Call if you experience:  Fever/chills associated with headache or increased back/neck pain  Headache worsened by an upright position  New onset weakness or numbness of an extremity below  the injection site  Hives or difficulty breathing (go to the emergency room)  Inflammation or drainage at the injection  site(s)  Severe back/neck pain greater than usual  New symptoms which are concerning to you  Please note:  Although the local anesthetic injected can often make your back or neck feel good for several hours after the injection the pain will likely return.  It takes 3-5 days for steroids to work on the nerve root. You may not notice any pain relief for at least one week.  If effective, we will often do a series of 3 injections spaced 3-6 weeks apart to maximally decrease your pain.    If you have any questions, please call [phone] [facility] ClinicGENERAL RISKS AND COMPLICATIONS  What are the risk, side effects and possible complications? Generally speaking, most procedures are safe.  However, with any procedure there are risks, side effects, and the possibility of complications.  The risks and complications are dependent upon the sites that are lesioned, or the type of nerve block to be performed.  The closer the procedure is to the spine, the more serious the risks are.  Great care is taken when placing the radio frequency needles, block needles or lesioning probes, but sometimes complications can occur. 1. Infection: Any time there is an injection through the skin, there is a risk of infection.  This is why sterile conditions are used for these blocks.  There are four possible types of infection. 1. Localized skin infection. 2. Central Nervous System Infection-This can be in the form of Meningitis, which can be deadly. 3. Epidural Infections-This can be in the form of an epidural abscess, which can cause pressure inside of the spine, causing compression of the spinal cord with subsequent paralysis. This would require an emergency surgery to decompress, and there are no guarantees that the patient would recover from the  paralysis. 4. Discitis-This is an infection of the intervertebral discs.  It occurs in about 1% of discography procedures.  It is difficult to treat and it may lead to surgery.        2. Pain: the needles have to go through skin and soft tissues, will cause soreness.       3. Damage to internal structures:  The nerves to be lesioned may be near blood vessels or    other nerves which can be potentially damaged.       4. Bleeding: Bleeding is more common if the patient is taking blood thinners such as  aspirin, Coumadin, Ticiid, Plavix, etc., or if he/she have some genetic predisposition  such as hemophilia. Bleeding into the spinal canal can cause compression of the spinal  cord with subsequent paralysis.  This would require an emergency surgery to  decompress and there are no guarantees that the patient would recover from the  paralysis.       5. Pneumothorax:  Puncturing of a lung is a possibility, every time a needle is introduced in  the area of the chest or upper back.  Pneumothorax refers to free air around the  collapsed lung(s), inside of the thoracic cavity (chest cavity).  Another two possible  complications related to a similar event would include: Hemothorax and Chylothorax.   These are variations of the Pneumothorax, where instead of air around the collapsed  lung(s), you may have blood or chyle, respectively.       6. Spinal headaches: They may occur with any procedures in the area of the spine.       7. Persistent CSF (Cerebro-Spinal Fluid) leakage: This is a rare problem, but may  occur  with prolonged intrathecal or epidural catheters either due to the formation of a fistulous  track or a dural tear.       8. Nerve damage: By working so close to the spinal cord, there is always a possibility of  nerve damage, which could be as serious as a permanent spinal cord injury with  paralysis.       9. Death:  Although rare, severe deadly allergic reactions known as "Anaphylactic  reaction" can  occur to any of the medications used.      10. Worsening of the symptoms:  We can always make thing worse.  What are the chances of something like this happening? Chances of any of this occuring are extremely low.  By statistics, you have more of a chance of getting killed in a motor vehicle accident: while driving to the hospital than any of the above occurring .  Nevertheless, you should be aware that they are possibilities.  In general, it is similar to taking a shower.  Everybody knows that you can slip, hit your head and get killed.  Does that mean that you should not shower again?  Nevertheless always keep in mind that statistics do not mean anything if you happen to be on the wrong side of them.  Even if a procedure has a 1 (one) in a 1,000,000 (million) chance of going wrong, it you happen to be that one..Also, keep in mind that by statistics, you have more of a chance of having something go wrong when taking medications.  Who should not have this procedure? If you are on a blood thinning medication (e.g. Coumadin, Plavix, see list of "Blood Thinners"), or if you have an active infection going on, you should not have the procedure.  If you are taking any blood thinners, please inform your physician.  How should I prepare for this procedure?  Do not eat or drink anything at least six hours prior to the procedure.  Bring a driver with you .  It cannot be a taxi.  Come accompanied by an adult that can drive you back, and that is strong enough to help you if your legs get weak or numb from the local anesthetic.  Take all of your medicines the morning of the procedure with just enough water to swallow them.  If you have diabetes, make sure that you are scheduled to have your procedure done first thing in the morning, whenever possible.  If you have diabetes, take only half of your insulin dose and notify our nurse that you have done so as soon as you arrive at the clinic.  If you are  diabetic, but only take blood sugar pills (oral hypoglycemic), then do not take them on the morning of your procedure.  You may take them after you have had the procedure.  Do not take aspirin or any aspirin-containing medications, at least eleven (11) days prior to the procedure.  They may prolong bleeding.  Wear loose fitting clothing that may be easy to take off and that you would not mind if it got stained with Betadine or blood.  Do not wear any jewelry or perfume  Remove any nail coloring.  It will interfere with some of our monitoring equipment.  NOTE: Remember that this is not meant to be interpreted as a complete list of all possible complications.  Unforeseen problems may occur.  BLOOD THINNERS The following drugs contain aspirin or other products, which can cause increased bleeding  during surgery and should not be taken for 2 weeks prior to and 1 week after surgery.  If you should need take something for relief of minor pain, you may take acetaminophen which is found in Tylenol,m Datril, Anacin-3 and Panadol. It is not blood thinner. The products listed below are.  Do not take any of the products listed below in addition to any listed on your instruction sheet.  A.P.C or A.P.C with Codeine Codeine Phosphate Capsules #3 Ibuprofen Ridaura  ABC compound Congesprin Imuran rimadil  Advil Cope Indocin Robaxisal  Alka-Seltzer Effervescent Pain Reliever and Antacid Coricidin or Coricidin-D  Indomethacin Rufen  Alka-Seltzer plus Cold Medicine Cosprin Ketoprofen S-A-C Tablets  Anacin Analgesic Tablets or Capsules Coumadin Korlgesic Salflex  Anacin Extra Strength Analgesic tablets or capsules CP-2 Tablets Lanoril Salicylate  Anaprox Cuprimine Capsules Levenox Salocol  Anexsia-D Dalteparin Magan Salsalate  Anodynos Darvon compound Magnesium Salicylate Sine-off  Ansaid Dasin Capsules Magsal Sodium Salicylate  Anturane Depen Capsules Marnal Soma  APF Arthritis pain formula Dewitt's Pills  Measurin Stanback  Argesic Dia-Gesic Meclofenamic Sulfinpyrazone  Arthritis Bayer Timed Release Aspirin Diclofenac Meclomen Sulindac  Arthritis pain formula Anacin Dicumarol Medipren Supac  Analgesic (Safety coated) Arthralgen Diffunasal Mefanamic Suprofen  Arthritis Strength Bufferin Dihydrocodeine Mepro Compound Suprol  Arthropan liquid Dopirydamole Methcarbomol with Aspirin Synalgos  ASA tablets/Enseals Disalcid Micrainin Tagament  Ascriptin Doan's Midol Talwin  Ascriptin A/D Dolene Mobidin Tanderil  Ascriptin Extra Strength Dolobid Moblgesic Ticlid  Ascriptin with Codeine Doloprin or Doloprin with Codeine Momentum Tolectin  Asperbuf Duoprin Mono-gesic Trendar  Aspergum Duradyne Motrin or Motrin IB Triminicin  Aspirin plain, buffered or enteric coated Durasal Myochrisine Trigesic  Aspirin Suppositories Easprin Nalfon Trillsate  Aspirin with Codeine Ecotrin Regular or Extra Strength Naprosyn Uracel  Atromid-S Efficin Naproxen Ursinus  Auranofin Capsules Elmiron Neocylate Vanquish  Axotal Emagrin Norgesic Verin  Azathioprine Empirin or Empirin with Codeine Normiflo Vitamin E  Azolid Emprazil Nuprin Voltaren  Bayer Aspirin plain, buffered or children's or timed BC Tablets or powders Encaprin Orgaran Warfarin Sodium  Buff-a-Comp Enoxaparin Orudis Zorpin  Buff-a-Comp with Codeine Equegesic Os-Cal-Gesic   Buffaprin Excedrin plain, buffered or Extra Strength Oxalid   Bufferin Arthritis Strength Feldene Oxphenbutazone   Bufferin plain or Extra Strength Feldene Capsules Oxycodone with Aspirin   Bufferin with Codeine Fenoprofen Fenoprofen Pabalate or Pabalate-SF   Buffets II Flogesic Panagesic   Buffinol plain or Extra Strength Florinal or Florinal with Codeine Panwarfarin   Buf-Tabs Flurbiprofen Penicillamine   Butalbital Compound Four-way cold tablets Penicillin   Butazolidin Fragmin Pepto-Bismol   Carbenicillin Geminisyn Percodan   Carna Arthritis Reliever Geopen Persantine    Carprofen Gold's salt Persistin   Chloramphenicol Goody's Phenylbutazone   Chloromycetin Haltrain Piroxlcam   Clmetidine heparin Plaquenil   Cllnoril Hyco-pap Ponstel   Clofibrate Hydroxy chloroquine Propoxyphen         Before stopping any of these medications, be sure to consult the physician who ordered them.  Some, such as Coumadin (Warfarin) are ordered to prevent or treat serious conditions such as "deep thrombosis", "pumonary embolisms", and other heart problems.  The amount of time that you may need off of the medication may also vary with the medication and the reason for which you were taking it.  If you are taking any of these medications, please make sure you notify your pain physician before you undergo any procedures.

## 2015-09-01 NOTE — Progress Notes (Signed)
Safety precautions to be maintained throughout the outpatient stay will include: orient to surroundings, keep bed in low position, maintain call bell within reach at all times, provide assistance with transfer out of bed and ambulation.  

## 2015-09-01 NOTE — Progress Notes (Signed)
Subjective:    Patient ID: Kristi [name], female    DOB: 09/22/1940, 75 y.o.   MRN: [id number]  HPI  The patient is a 75 year old female who returns to pain management for further evaluation and treatment of pain involving the lumbar and lower extremity region. The patient states that she has some weakness of the lower extremities which is aggravated by standing and walking. The patient is not considered to be surgical candidate for treatment of her condition. The patient stated that the pain in paresthesias increases with standing and walking. The patient denies any recent trauma change in events of daily living the call significant change in symptomatology. We discussed patient's condition and will consider patient for interventional treatment consisting of lumbosacral selective nerve root block to be performed at time of return appointment. The patient will continue oxycodone as prescribed. The patient is with understanding and agreed to suggested treatment plan.  Review of Systems     Objective:   Physical Exam  There was tenderness to palpation of the paraspinal muscular treat the cervical region cervical facet region palpation which reproduces pain of mild degree with mild tenderness of the splenius capitis and occipitalis regions. Palpation of the acromioclavicular and glenohumeral joint regions reproduce moderate discomfort. The patient appeared to be with slightly decreased grip strength and Tinel and Phalen's maneuver were without increased pain of significant degree. The patient appeared to be with unremarkable Spurling's maneuver. Palpation over the thoracic region was with no crepitus of the thoracic region noted. Palpation over the lumbar paraspinal musculatures and lumbar facet region was with moderate tenderness to palpation with lateral bending rotation extension and palpation of the lumbar facets reproducing moderate discomfort. There was mild tenderness to moderate tenderness of  the PSIS and PII S region as well as the gluteal and piriformis musculature regions. Palpation of the greater trochanteric region iliotibial band region reproduced pain of mild degree. There was decreased EHL strength. No definite sensory deficit or dermatomal dystrophy she was detected. There appeared to be negative clonus negative Homans. Abdomen was nontender with no costovertebral tenderness noted      Assessment & Plan:      Degenerative disc disease lumbar spine Degenerative changes L2-L3 L3-L4 with disc bulging and L3-4 facet arthropathy, severe spinal canal stenosis L4-L5 disc bulging and multilevel facet arthropathy, osteopenia, scoliosis, severe spinal canal stenosis L4-L5 disc bulging and arthropathy, retrolisthesis on the right L2-3 and L3-4  Lumbar radiculopathy   Lumbar stenosis with neurogenic claudication  Lumbar facet syndrome  Spinal stenosis with neurogenic claudication  Sacroiliac joint dysfunction     PLAN   Continue present medication oxycodone. Please discuss decreasing and discontinuing the use of Xanax with Dr. Brunetta Genera  Lumbosacral selective nerve root block to be performed at time of return appointment  F/U PCP Dr. Brunetta Genera for evaliation of  BP, decreasing and discontinuing Xanax, and general medical  condition  F/U surgical evaluation. May consider pending follow-up evaluations. The patient is with prior surgical evaluation without plans for surgical intervention for lumbar and lower extremity pain and paresthesias. The patient will follow-up with Dr. Mack Guise for follow-up evaluation and treatment of fracture of the left upper extremity as needed  F/U neurological evaluation. May consider pending follow-up evaluations. May consider additional studies . At the present time we will avoid PNCV/EMG studies and other studies  May consider radiofrequency rhizolysis or intraspinal procedures pending response to present treatment and F/U evaluation . We  will avoid considering such treatment at this  time  Patient to call Pain Management Center should patient have concerns prior to scheduled return appointment

## 2015-09-03 ENCOUNTER — Telehealth: Payer: Self-pay

## 2015-09-03 NOTE — Telephone Encounter (Signed)
Pt says Dr. Primus Bravo was suppose to write her a script for 120 oxycodone but instead he gave her 100

## 2015-09-03 NOTE — Telephone Encounter (Signed)
Spoke with Mrs Baechle about the quantity that was written for oxycodone qty 100 instead of qty 120 with a sig of  2 - 4 times per day.  I told Mrs Zamor that I would send Dr Primus Bravo a message re; this and find out if he intended to decrease her medicine intake.  Mrs Borruso responded that he did not mean to decrease her medicine or he would have told her.  Told that he would be out of the office until Monday but then he would see the message and let us know how to handle the situation.

## 2015-09-06 ENCOUNTER — Other Ambulatory Visit: Payer: Self-pay | Admitting: Pain Medicine

## 2015-09-06 NOTE — Telephone Encounter (Signed)
Nurses, Patient has Xanax.  Will need to decrease and stop Xanax or significantly decrease or stop oxycodone due to regulations. Therefore,I decreased Xanax. Thank you

## 2015-09-07 ENCOUNTER — Telehealth: Payer: Self-pay

## 2015-09-07 NOTE — Telephone Encounter (Signed)
Please call pt about medication issue. I see Dr. Primus Bravo has a note in pts chart. I'm not sure how to explain that to her because I'm not a nurse and I do not want to tell her anything wrong. Pt called on 6/15

## 2015-09-07 NOTE — Telephone Encounter (Signed)
Called pt- she states she was 20 pills less- Dr crisp cut med back since takingXanax- pt states she is no longer taking Xanax- instructed pt that he will not increase her meds at this time

## 2015-09-08 NOTE — Telephone Encounter (Signed)
Patient notified of Dr. Ethel Rana response.

## 2015-09-08 NOTE — Telephone Encounter (Signed)
Dr. Primus Bravo, patient wants to know why you decreased Oxycodone from 120 to 100.

## 2015-09-08 NOTE — Telephone Encounter (Signed)
Nurses The oxycodone was decreased due to patient's use of Xanax The patient will need to decrease and discontinue Xanax as per her prescribing physician. Please discuss further details with me if necessary Thank you

## 2015-09-09 ENCOUNTER — Ambulatory Visit: Payer: Medicare Other | Admitting: Pain Medicine

## 2015-09-16 ENCOUNTER — Encounter: Payer: Self-pay | Admitting: Pain Medicine

## 2015-09-16 ENCOUNTER — Ambulatory Visit: Payer: Medicare Other | Admitting: Pain Medicine

## 2015-09-16 ENCOUNTER — Ambulatory Visit: Payer: Medicare Other | Attending: Pain Medicine | Admitting: Pain Medicine

## 2015-09-16 DIAGNOSIS — M5126 Other intervertebral disc displacement, lumbar region: Secondary | ICD-10-CM | POA: Diagnosis not present

## 2015-09-16 DIAGNOSIS — M47816 Spondylosis without myelopathy or radiculopathy, lumbar region: Secondary | ICD-10-CM | POA: Diagnosis not present

## 2015-09-16 DIAGNOSIS — M4806 Spinal stenosis, lumbar region: Secondary | ICD-10-CM | POA: Diagnosis not present

## 2015-09-16 DIAGNOSIS — M5136 Other intervertebral disc degeneration, lumbar region: Secondary | ICD-10-CM

## 2015-09-16 DIAGNOSIS — M8580 Other specified disorders of bone density and structure, unspecified site: Secondary | ICD-10-CM | POA: Insufficient documentation

## 2015-09-16 DIAGNOSIS — M1288 Other specific arthropathies, not elsewhere classified, other specified site: Secondary | ICD-10-CM | POA: Diagnosis not present

## 2015-09-16 DIAGNOSIS — M545 Low back pain: Secondary | ICD-10-CM | POA: Diagnosis present

## 2015-09-16 DIAGNOSIS — M4186 Other forms of scoliosis, lumbar region: Secondary | ICD-10-CM | POA: Insufficient documentation

## 2015-09-16 DIAGNOSIS — M79606 Pain in leg, unspecified: Secondary | ICD-10-CM | POA: Diagnosis present

## 2015-09-16 DIAGNOSIS — M48062 Spinal stenosis, lumbar region with neurogenic claudication: Secondary | ICD-10-CM

## 2015-09-16 DIAGNOSIS — M5416 Radiculopathy, lumbar region: Secondary | ICD-10-CM

## 2015-09-16 DIAGNOSIS — M533 Sacrococcygeal disorders, not elsewhere classified: Secondary | ICD-10-CM

## 2015-09-16 DIAGNOSIS — M48061 Spinal stenosis, lumbar region without neurogenic claudication: Secondary | ICD-10-CM

## 2015-09-16 MED ORDER — FENTANYL CITRATE (PF) 100 MCG/2ML IJ SOLN
100.0000 ug | Freq: Once | INTRAMUSCULAR | Status: AC
  Administered 2015-09-16: 50 ug via INTRAVENOUS
  Filled 2015-09-16: qty 2

## 2015-09-16 MED ORDER — CIPROFLOXACIN IN D5W 400 MG/200ML IV SOLN
400.0000 mg | Freq: Once | INTRAVENOUS | Status: AC
  Administered 2015-09-16: 400 mg via INTRAVENOUS
  Filled 2015-09-16: qty 200

## 2015-09-16 MED ORDER — ORPHENADRINE CITRATE 30 MG/ML IJ SOLN
60.0000 mg | Freq: Once | INTRAMUSCULAR | Status: AC
  Administered 2015-09-16: 60 mg via INTRAMUSCULAR
  Filled 2015-09-16: qty 2

## 2015-09-16 MED ORDER — LACTATED RINGERS IV SOLN
1000.0000 mL | INTRAVENOUS | Status: DC
  Administered 2015-09-16: 1000 mL via INTRAVENOUS

## 2015-09-16 MED ORDER — CIPROFLOXACIN HCL 250 MG PO TABS: 250.0000 mg | ORAL_TABLET | Freq: Two times a day (BID) | ORAL | Status: DC

## 2015-09-16 MED ORDER — BUPIVACAINE HCL (PF) 0.25 % IJ SOLN
30.0000 mL | Freq: Once | INTRAMUSCULAR | Status: AC
  Administered 2015-09-16: 30 mL
  Filled 2015-09-16: qty 30

## 2015-09-16 MED ORDER — LIDOCAINE HCL (PF) 1 % IJ SOLN
10.0000 mL | Freq: Once | INTRAMUSCULAR | Status: AC
  Administered 2015-09-16: 10 mL via SUBCUTANEOUS

## 2015-09-16 MED ORDER — MIDAZOLAM HCL 5 MG/5ML IJ SOLN
5.0000 mg | Freq: Once | INTRAMUSCULAR | Status: AC
  Administered 2015-09-16: 1 mg via INTRAVENOUS
  Filled 2015-09-16: qty 5

## 2015-09-16 MED ORDER — TRIAMCINOLONE ACETONIDE 40 MG/ML IJ SUSP
40.0000 mg | Freq: Once | INTRAMUSCULAR | Status: AC
  Administered 2015-09-16: 40 mg
  Filled 2015-09-16: qty 1

## 2015-09-16 NOTE — Patient Instructions (Addendum)
PLAN  Continue present medication Cipro and begin taking antibiotic as prescribed. Please obtain your antibiotic Cipro today  and begin taking antibiotic today  F/U PCP Dr Brunetta Genera for evaliation of  BP and general medical  condition.  F/U surgical evaluation. May consider pending follow-up evaluations  F/U neurological evaluation. May consider pending follow-up evaluations  May consider radiofrequency rhizolysis or intraspinal procedures pending response to present treatment and F/U evaluation.  Patient to call Pain Management Center should patient have concerns prior to scheduled return appointment  Selective Nerve Root Block Patient Information  Description: Specific nerve roots exit the spinal canal and these nerves can be compressed and inflamed by a bulging disc and bone spurs.  By injecting steroids on the nerve root, we can potentially decrease the inflammation surrounding these nerves, which often leads to decreased pain.  Also, by injecting local anesthesia on the nerve root, this can provide [place] helpful information to give to your referring doctor if it decreases your pain.  Selective nerve root blocks can be done along the spine from the neck to the low back depending on the location of your pain.   After numbing the skin with local anesthesia, a small needle is passed to the nerve root and the position of the needle is verified using x-ray pictures.  After the needle is in correct position, we then deposit the medication.  You may experience a pressure sensation while this is being done.  The entire block usually lasts less than 15 minutes.  Conditions that may be treated with selective nerve root blocks:  Low back and leg pain  Spinal stenosis  Diagnostic block prior to potential surgery  Neck and arm pain  Post laminectomy syndrome  Preparation for the injection:  1. Do not eat any solid food or dairy products within 8 hours of your appointment. 2. You may drink clear  liquids up to 3 hours before an appointment.  Clear liquids include water, black coffee, juice or soda.  No milk or cream please. 3. You may take your regular medications, including pain medications, with a sip of water before your appointment.  Diabetics should hold regular insulin (if taken separately) and take 1/2 normal NPH dose the morning of the procedure.  Carry some sugar containing items with you to your appointment. 4. A driver must accompany you and be prepared to drive you home after your procedure. 5. Bring all your current medications with you. 6. An IV may be inserted and sedation may be given at the discretion of the physician. 7. A blood pressure cuff, EKG, and other monitors will often be applied during the procedure.  Some patients may need to have extra oxygen administered for a short period. 8. You will be asked to provide medical information, including allergies, prior to the procedure.  We must know immediately if you are taking blood  Thinners (like Coumadin) or if you are allergic to IV iodine contrast (dye).  Possible side-effects: All are usually temporary  Bleeding from needle site  Light headedness  Numbness and tingling  Decreased blood pressure  Weakness in arms/legs  Pressure sensation in back/neck  Pain at injection site (several days)  Possible complications: All are extremely rare  Infection  Nerve injury  Spinal headache (a headache wore with upright position)  Call if you experience:  Fever/chills associated with headache or increased back/neck pain  Headache worsened by an upright position  New onset weakness or numbness of an extremity below the injection site  Hives or difficulty breathing (go to the emergency room)  Inflammation or drainage at the injection site(s)  Severe back/neck pain greater than usual  New symptoms which are concerning to you  Please note:  Although the local anesthetic injected can often make your  back or neck feel good for several hours after the injection the pain will likely return.  It takes 3-5 days for steroids to work on the nerve root. You may not notice any pain relief for at least one week.  If effective, we will often do a series of 3 injections spaced 3-6 weeks apart to maximally decrease your pain.    If you have any questions, please call [phone] [facility]  Be careful moving about. Muscle spasms in the area of the injection may occur.  Use ice for the next 24 hours (15 minutes on, 15 minutes off).  After 24 hours, you may use heat for comfort if you wish.  Post procedure numbness or redness is expected, average 4-6 hours.  If numbness develops after 4-6 hours and is felt to be progressing and worsening, immediately contact your physician.  A prescription for CIPRO was sent to your pharmacy and should be available for pickup today.

## 2015-09-16 NOTE — Progress Notes (Signed)
Safety precautions to be maintained throughout the outpatient stay will include: orient to surroundings, keep bed in low position, maintain call bell within reach at all times, provide assistance with transfer out of bed and ambulation.  

## 2015-09-16 NOTE — Progress Notes (Signed)
Subjective:    Patient ID: Kristi [name], female    DOB: 03/19/1941, 75 y.o.   MRN: [id number]  HPI  PROCEDURE PERFORMED: Lumbosacral selective nerve root block   NOTE: The patient is a 75 y.o. female who returns to [facility] for further evaluation and treatment of pain involving the lumbar and lower extremity region. Studies consisting of MRI has revealed the patient to be with evidence of Degenerative disc disease lumbar spine Degenerative changes L2-L3 L3-L4 with disc bulging and L3-4 facet arthropathy, severe spinal canal stenosis L4-L5 disc bulging and multilevel facet arthropathy, osteopenia, scoliosis, severe spinal canal stenosis L4-L5 disc bulging and arthropathy, retrolisthesis on the right L2-3 and L3-4. There is concern regarding intraspinal abnormalities contributing to the patient's symptomatology with concern regarding component of pain due to lumbar radiculopathy as well as lumbar stenosis with neurogenic claudication. The risks, benefits, and expectations of the procedure have been explained to the patient who was understanding and in agreement with suggested treatment plan. We will proceed with interventional treatment as discussed and as explained to the patient. The patient is understanding and in agreement with suggested treatment plan.   DESCRIPTION OF PROCEDURE: Lumbosacral selective nerve root block with IV Versed, IV fentanyl conscious sedation, EKG, blood pressure, pulse, capnography, and pulse oximetry monitoring. The procedure was performed with the patient in the prone position under fluoroscopic guidance. With the patient in the prone position, Betadine prep of proposed entry site was performed. Local anesthetic skin wheal of proposed needle entry site was prepared with 1.5% plain lidocaine with AP view of the lumbosacral spine.   PROCEDURE #1: Needle placement at the right L 2 vertebral body: A 22 -gauge needle was inserted at the inferior border of the  transverse process of the vertebral body with needle placed medial to the midline of the transverse process on AP view of the lumbosacral spine.   NEEDLE PLACEMENT AT  L3, L4, and L5  VERTEBRAL BODY LEVELS  Needle  placement was accomplished at L3, L4, and L5  vertebral body levels on the right side exactly as was accomplished at the L2  vertebral body level  and utilizing the same technique and under fluoroscopic guidance.    Needle placement was then verified on lateral view at all levels with needle tip documented to be in the posterior superior quadrant of the intervertebral foramen of  L 2, L3, L4, and L5. Following negative aspiration for heme and CSF at each level, each level was injected with 3 mL of 0.25% bupivacaine with Kenalog.   Myoneural block injections of the gluteal musculature region and lumbar paraspinal musculature region Following Betadine prep of proposed entry site a 22-gauge needle was inserted into the lumbar paraspinal musculature region and following negative aspiration 2 cc of 0.25% bupivacaine with flexor was injected for myoneural block injection of the lumbar paraspinal musculature region 1 and myoneural block injection of the gluteal musculature region times to    The patient tolerated the procedure well  . A total of 10 mg of Kenalog was utilized for the procedure.   PLAN:  1. Medications: Will continue presently prescribed medication oxycodone 2. The patient is to undergo follow-up evaluation with PCP Dr. Brunetta Genera for evaluation of blood pressure and general medical condition status post procedure performed on today's visit. 3. Surgical follow-up evaluation.. Patient is undergone surgical evaluation and is without plans for surgical intervention 4. Neurological evaluation. May consider PNCV EMG studies and other studies 5. May consider radiofrequency  procedures, implantation type procedures and other treatment pending response to treatment and follow-up  evaluation. 6. The patient has been advise do adhere to proper body mechanics and avoid activities which may aggravate condition. 7. The patient has been advised to call the Pain Management Center prior to scheduled return appointment should there be significant change in the patient's condition or should the patient have other concerns regarding condition prior to scheduled return appointment.   Review of Systems     Objective:   Physical Exam        Assessment & Plan:

## 2015-09-17 ENCOUNTER — Telehealth: Payer: Self-pay | Admitting: *Deleted

## 2015-09-17 NOTE — Telephone Encounter (Signed)
Spoke with patient, denies any concerns from procedure on yesterday.

## 2015-10-01 ENCOUNTER — Ambulatory Visit: Payer: Medicare Other | Admitting: Pain Medicine

## 2015-10-06 ENCOUNTER — Ambulatory Visit: Payer: Medicare Other | Attending: Pain Medicine | Admitting: Pain Medicine

## 2015-10-06 ENCOUNTER — Encounter: Payer: Self-pay | Admitting: Pain Medicine

## 2015-10-06 VITALS — BP 146/86 | HR 88 | Temp 97.8°F | Resp 16 | Ht 66.0 in | Wt 145.0 lb

## 2015-10-06 DIAGNOSIS — M5126 Other intervertebral disc displacement, lumbar region: Secondary | ICD-10-CM | POA: Diagnosis not present

## 2015-10-06 DIAGNOSIS — M4806 Spinal stenosis, lumbar region: Secondary | ICD-10-CM | POA: Insufficient documentation

## 2015-10-06 DIAGNOSIS — M5136 Other intervertebral disc degeneration, lumbar region: Secondary | ICD-10-CM

## 2015-10-06 DIAGNOSIS — M4186 Other forms of scoliosis, lumbar region: Secondary | ICD-10-CM | POA: Diagnosis not present

## 2015-10-06 DIAGNOSIS — M5416 Radiculopathy, lumbar region: Secondary | ICD-10-CM

## 2015-10-06 DIAGNOSIS — M47816 Spondylosis without myelopathy or radiculopathy, lumbar region: Secondary | ICD-10-CM

## 2015-10-06 DIAGNOSIS — M533 Sacrococcygeal disorders, not elsewhere classified: Secondary | ICD-10-CM | POA: Insufficient documentation

## 2015-10-06 DIAGNOSIS — M79606 Pain in leg, unspecified: Secondary | ICD-10-CM | POA: Diagnosis present

## 2015-10-06 DIAGNOSIS — M545 Low back pain: Secondary | ICD-10-CM | POA: Diagnosis present

## 2015-10-06 DIAGNOSIS — M47896 Other spondylosis, lumbar region: Secondary | ICD-10-CM | POA: Diagnosis not present

## 2015-10-06 DIAGNOSIS — M5116 Intervertebral disc disorders with radiculopathy, lumbar region: Secondary | ICD-10-CM | POA: Insufficient documentation

## 2015-10-06 DIAGNOSIS — M4316 Spondylolisthesis, lumbar region: Secondary | ICD-10-CM | POA: Diagnosis not present

## 2015-10-06 DIAGNOSIS — M48061 Spinal stenosis, lumbar region without neurogenic claudication: Secondary | ICD-10-CM

## 2015-10-06 DIAGNOSIS — M48062 Spinal stenosis, lumbar region with neurogenic claudication: Secondary | ICD-10-CM

## 2015-10-06 MED ORDER — OXYCODONE HCL 5 MG PO TABS: ORAL_TABLET | ORAL | Status: DC

## 2015-10-06 NOTE — Patient Instructions (Addendum)
PLAN   Continue present medication oxycodone. Please discuss decreasing and discontinuing the use of Xanax with Dr. Brunetta Genera  F/U PCP Dr. Brunetta Genera for evaliation of  BP, decreasing and discontinuing Xanax, and general medical  condition  F/U surgical evaluation.  The patient is with prior surgical evaluation without plans for surgical intervention for lumbar and lower extremity pain and paresthesias. The patient will follow-up with Dr. Mack Guise for follow-up evaluation and treatment of fracture of the left upper extremity as needed We will schedule neurosurgical evaluation of patient's lumbar and lower extremity pain and weakness for recommendations regarding any treatment such as braces and other devices which may help prevent progression of patient's condition and have avoid falling or any other injuries  Ask the nurses and secretary the date of your neurosurgical evaluation  F/U neurological evaluation. May consider pending follow-up evaluations. May consider additional studies . At the present time we will avoid PNCV/EMG studies and other studies  May consider radiofrequency rhizolysis or intraspinal procedures pending response to present treatment and F/U evaluation   Patient to call Pain Management Center should patient have concerns prior to scheduled return appointmentPain Management Discharge Instructions  General Discharge Instructions :  If you need to reach your doctor call: Monday-Friday 8:00 am - 4:00 pm at [phone] or toll free [phone].  After clinic hours [phone] to have operator reach doctor.  Bring all of your medication bottles to all your appointments in the pain clinic.  To cancel or reschedule your appointment with Pain Management please remember to call 24 hours in advance to avoid a fee.  Refer to the educational materials which you have been given on: General Risks, I had my Procedure. Discharge Instructions, Post Sedation.  Post Procedure  Instructions:  The drugs you were given will stay in your system until tomorrow, so for the next 24 hours you should not drive, make any legal decisions or drink any alcoholic beverages.  You may eat anything you prefer, but it is better to start with liquids then soups and crackers, and gradually work up to solid foods.  Please notify your doctor immediately if you have any unusual bleeding, trouble breathing or pain that is not related to your normal pain.  Depending on the type of procedure that was done, some parts of your body may feel week and/or numb.  This usually clears up by tonight or the next day.  Walk with the use of an assistive device or accompanied by an adult for the 24 hours.  You may use ice on the affected area for the first 24 hours.  Put ice in a Ziploc bag and cover with a towel and place against area 15 minutes on 15 minutes off.  You may switch to heat after 24 hours.

## 2015-10-06 NOTE — Progress Notes (Signed)
Safety precautions to be maintained throughout the outpatient stay will include: orient to surroundings, keep bed in low position, maintain call bell within reach at all times, provide assistance with transfer out of bed and ambulation.  

## 2015-10-06 NOTE — Progress Notes (Signed)
   Subjective:    Patient ID: Kristi [name], female    DOB: 1940-12-05, 75 y.o.   MRN: [id number]  HPI The patient is a 75 year old female who returns to pain management for further evaluation and treatment of pain involving the region of the mid lower back lower extremity region with pain stating that the pain is aggravated by standing and walking associated with weakness of the lower extremities. The patient denies any recent trauma change in events of daily living the call significant change in symptomatology. We will continue oxycodone at this time. We will also proceed with neurosurgical evaluation for further assessment of patient's condition and recommendations regarding any treatment which may be beneficial in terms of decreasing patient's symptoms. We will have neurosurgeon address the use of brace as well as other devices which may be beneficial to the patient. All agreed to suggested treatment plan    Review of Systems     Objective:   Physical Exam   There was tends to palpation of the paraspinal musculature region of the cervical cervical region a mild degree with mild tenderness of the splenius capitis and occipitalis region. Mild tenderness of the cervical facet thoracic facet region was noted. Palpation over the thoracic region was with tenderness to palpation with crepitus. Palpation of the lumbar region was of increased pain of severe degree with lateral bending rotation extension and palpation of the lumbar facets reproducing moderate to moderately severe discomfort with mild tenderness over the greater trochanteric region iliotibial band region and moderate tenderness of the PSIS and PII S region. Straight leg raise was tolerates approximately 30 without increased pain with dorsiflexion noted. EHL strength appeared to be decreased. No definite sensory deficit or dermatomal distribution detected. There was negative clonus negative Homans. Abdomen nontender with no costovertebral  tenderness noted     Assessment & Plan:     Degenerative disc disease lumbar spine Degenerative changes L2-L3 L3-L4 with disc bulging and L3-4 facet arthropathy, severe spinal canal stenosis L4-L5 disc bulging and multilevel facet arthropathy, osteopenia, scoliosis, severe spinal canal stenosis L4-L5 disc bulging and arthropathy, retrolisthesis on the right L2-3 and L3-4  Lumbar radiculopathy   Lumbar stenosis with neurogenic claudication  Lumbar facet syndrome  Spinal stenosis with neurogenic claudication  Sacroiliac joint dysfunction      PLAN   Continue present medication oxycodone. Please discuss decreasing and discontinuing the use of Xanax with Dr. Brunetta Genera  F/U PCP Dr. Brunetta Genera for evaliation of  BP, decreasing and discontinuing Xanax, and general medical  condition  F/U surgical evaluation.  The patient is with prior surgical evaluation without plans for surgical intervention for lumbar and lower extremity pain and paresthesias. The patient will follow-up with Dr. Mack Guise for follow-up evaluation and treatment of fracture of the left upper extremity as needed We will schedule neurosurgical evaluation of patient's lumbar and lower extremity pain and weakness for recommendations regarding any treatment such as braces and other devices which may help prevent progression of patient's condition and have avoid falling or any other injuries  Ask the nurses and secretary the date of your neurosurgical evaluation  F/U neurological evaluation. May consider pending follow-up evaluations. May consider additional studies . At the present time we will avoid PNCV/EMG studies and other studies  May consider radiofrequency rhizolysis or intraspinal procedures pending response to present treatment and F/U evaluation   Patient to call Pain Management Center should patient have concerns prior to scheduled return appointment

## 2015-10-12 ENCOUNTER — Telehealth: Payer: Self-pay | Admitting: *Deleted

## 2015-10-12 NOTE — Telephone Encounter (Signed)
Spoke with Dr.Crisp, he prefers patient speak with neurosurgoen about back brace. Patient informed of this, states she doesn't have a neurosurgeon. Instructed to speak with Dr. Primus Bravo about this at next appointment.

## 2015-11-04 ENCOUNTER — Ambulatory Visit: Payer: Medicare Other | Attending: Pain Medicine | Admitting: Pain Medicine

## 2015-11-04 ENCOUNTER — Encounter: Payer: Self-pay | Admitting: Pain Medicine

## 2015-11-04 VITALS — BP 110/67 | HR 60 | Temp 98.3°F | Resp 18 | Ht 67.0 in | Wt 140.0 lb

## 2015-11-04 DIAGNOSIS — M4316 Spondylolisthesis, lumbar region: Secondary | ICD-10-CM | POA: Diagnosis not present

## 2015-11-04 DIAGNOSIS — M79606 Pain in leg, unspecified: Secondary | ICD-10-CM | POA: Diagnosis present

## 2015-11-04 DIAGNOSIS — M8580 Other specified disorders of bone density and structure, unspecified site: Secondary | ICD-10-CM | POA: Insufficient documentation

## 2015-11-04 DIAGNOSIS — M533 Sacrococcygeal disorders, not elsewhere classified: Secondary | ICD-10-CM

## 2015-11-04 DIAGNOSIS — M47816 Spondylosis without myelopathy or radiculopathy, lumbar region: Secondary | ICD-10-CM

## 2015-11-04 DIAGNOSIS — M5416 Radiculopathy, lumbar region: Secondary | ICD-10-CM

## 2015-11-04 DIAGNOSIS — M4186 Other forms of scoliosis, lumbar region: Secondary | ICD-10-CM | POA: Diagnosis not present

## 2015-11-04 DIAGNOSIS — M47896 Other spondylosis, lumbar region: Secondary | ICD-10-CM | POA: Diagnosis not present

## 2015-11-04 DIAGNOSIS — M545 Low back pain: Secondary | ICD-10-CM | POA: Diagnosis present

## 2015-11-04 DIAGNOSIS — M5126 Other intervertebral disc displacement, lumbar region: Secondary | ICD-10-CM | POA: Diagnosis not present

## 2015-11-04 DIAGNOSIS — M4806 Spinal stenosis, lumbar region: Secondary | ICD-10-CM | POA: Insufficient documentation

## 2015-11-04 DIAGNOSIS — M48061 Spinal stenosis, lumbar region without neurogenic claudication: Secondary | ICD-10-CM

## 2015-11-04 DIAGNOSIS — M5136 Other intervertebral disc degeneration, lumbar region: Secondary | ICD-10-CM

## 2015-11-04 DIAGNOSIS — M5116 Intervertebral disc disorders with radiculopathy, lumbar region: Secondary | ICD-10-CM | POA: Diagnosis not present

## 2015-11-04 DIAGNOSIS — M48062 Spinal stenosis, lumbar region with neurogenic claudication: Secondary | ICD-10-CM

## 2015-11-04 MED ORDER — OXYCODONE HCL 5 MG PO TABS: ORAL_TABLET | ORAL | 0 refills | Status: DC

## 2015-11-04 NOTE — Progress Notes (Signed)
The patient is a 75 year old female who returns to pain management for further evaluation and treatment of pain involving the lumbar lower extremity region. The patient states that she has improved strength of the lower extremities and is able to perform activities of daily living without severely disabling pain and weakness of the lower back and lower extremity regions. The patient continues medications as prescribed and is without plans for surgical intervention. The patient has previously undergone surgical evaluation and decision was made to have patient continue treatment in pain management Center. At the present time we will avoid interventional treatment and we will continue presently prescribed medications and we will remain available to consider modifications of treatment regimen should they be significant change in patient's condition. All agreed with suggested treatment plan.    Physical examination   There was tenderness to palpation of the paraspinal musculature and cervical region cervical facet region of minimal degree with minimal tenderness of the splenius capitis and occipitalis region. Palpation of the acromioclavicular and glenohumeral joint region was with tenderness to palpation of minimal to moderate degree. The patient was with decreased range of motion of the shoulders with difficulty performing drop test. The patient appeared to be with decreased grip strength with Tinel and Phalen's maneuver reproducing moderate discomfort. There was no increased warmth erythema of the hands noted. Palpation over the region of the lumbar region was attends to palpation with lateral bending rotation extension and palpation of the lumbar facets reproducing mild to moderate discomfort. Straight leg raise was tolerates approximately 30 without increased pain noted with dorsiflexion. EHL strength was decreased. No definite sensory deficit or dermatomal distribution detected. There was negative  clonus negative Homans. Palpation over the PSIS and PII S region reproduced moderate discomfort with mild tenderness of the greater trochanteric region iliotibial band region. Abdomen was nontender with no costovertebral tenderness noted     Assessment   Degenerative disc disease lumbar spine Degenerative changes L2-L3 L3-L4 with disc bulging and L3-4 facet arthropathy, severe spinal canal stenosis L4-L5 disc bulging and multilevel facet arthropathy, osteopenia, scoliosis, severe spinal canal stenosis L4-L5 disc bulging and arthropathy, retrolisthesis on the right L2-3 and L3-4  Lumbar radiculopathy   Lumbar stenosis with neurogenic claudication  Lumbar facet syndrome  Spinal stenosis with neurogenic claudication  Sacroiliac joint dysfunction      PLAN   Continue present medication oxycodone. Please discuss decreasing and discontinuing the use of Xanax with Dr. Brunetta Genera as we discussed  F/U PCP Dr. Brunetta Genera for evaliation of  BP, decreasing and discontinuing Xanax, and general medical  condition  F/U surgical evaluation.  The patient is with prior surgical evaluation without plans for surgical intervention for lumbar and lower extremity pain and paresthesias. The patient will follow-up with Dr. Mack Guise for follow-up evaluation and treatment of fracture of the left upper extremity as needed F/U neurosurgical evaluation of patient's lumbar and lower extremity pain and weakness for recommendations regarding any treatment such as braces and other devices which may help prevent progression of patient's condition and have avoid falling or any other injuries as previously discussed  Ask the nurses and secretary the date of your neurosurgical evaluation  F/U neurological evaluation. May consider pending follow-up evaluations. May consider additional studies . At the present time we will avoid PNCV/EMG studies and other studies  May consider radiofrequency rhizolysis or intraspinal  procedures pending response to present treatment and F/U evaluation   Patient to call Pain Management Center should patient have concerns prior  to scheduled return appointment

## 2015-11-04 NOTE — Progress Notes (Signed)
Safety precautions to be maintained throughout the outpatient stay will include: orient to surroundings, keep bed in low position, maintain call bell within reach at all times, provide assistance with transfer out of bed and ambulation.  

## 2015-11-04 NOTE — Patient Instructions (Signed)
PLAN   Continue present medication oxycodone. Please discuss decreasing and discontinuing the use of Xanax with  Dr. Brunetta Genera  F/U PCP Dr. Brunetta Genera for evaliation of  BP, decreasing and discontinuing Xanax, and general medical  condition  F/U surgical evaluation.  The patient is with prior surgical evaluation without plans for surgical intervention for lumbar and lower extremity pain and paresthesias. The patient will follow-up with Dr. Mack Guise for follow-up evaluation and treatment of fracture of the left upper extremity as needed F/U neurosurgical evaluation of patient's lumbar and lower extremity pain and weakness for recommendations regarding any treatment such as braces and other devices which may help prevent progression of patient's condition and have avoid falling or any other injuries as discussed  Ask the nurses and secretary the date of your neurosurgical evaluation  F/U neurological evaluation. May consider pending follow-up evaluations. May consider additional studies . At the present time we will avoid PNCV/EMG studies and other studies  May consider radiofrequency rhizolysis or intraspinal procedures pending response to present treatment and F/U evaluation   Patient to call Pain Management Center should patient have concerns prior to scheduled return appointment

## 2015-11-30 ENCOUNTER — Ambulatory Visit: Payer: Medicare Other | Attending: Pain Medicine | Admitting: Pain Medicine

## 2015-11-30 ENCOUNTER — Encounter: Payer: Self-pay | Admitting: Pain Medicine

## 2015-11-30 VITALS — BP 149/84 | HR 88 | Temp 97.7°F | Resp 16 | Ht 66.0 in | Wt 140.0 lb

## 2015-11-30 DIAGNOSIS — M79606 Pain in leg, unspecified: Secondary | ICD-10-CM | POA: Diagnosis present

## 2015-11-30 DIAGNOSIS — M533 Sacrococcygeal disorders, not elsewhere classified: Secondary | ICD-10-CM | POA: Diagnosis not present

## 2015-11-30 DIAGNOSIS — M5416 Radiculopathy, lumbar region: Secondary | ICD-10-CM

## 2015-11-30 DIAGNOSIS — M5116 Intervertebral disc disorders with radiculopathy, lumbar region: Secondary | ICD-10-CM | POA: Diagnosis not present

## 2015-11-30 DIAGNOSIS — M545 Low back pain: Secondary | ICD-10-CM | POA: Diagnosis present

## 2015-11-30 DIAGNOSIS — M1288 Other specific arthropathies, not elsewhere classified, other specified site: Secondary | ICD-10-CM | POA: Diagnosis not present

## 2015-11-30 DIAGNOSIS — M4806 Spinal stenosis, lumbar region: Secondary | ICD-10-CM | POA: Diagnosis not present

## 2015-11-30 DIAGNOSIS — M4186 Other forms of scoliosis, lumbar region: Secondary | ICD-10-CM | POA: Diagnosis not present

## 2015-11-30 DIAGNOSIS — M5136 Other intervertebral disc degeneration, lumbar region: Secondary | ICD-10-CM

## 2015-11-30 DIAGNOSIS — M4316 Spondylolisthesis, lumbar region: Secondary | ICD-10-CM | POA: Diagnosis not present

## 2015-11-30 DIAGNOSIS — M47896 Other spondylosis, lumbar region: Secondary | ICD-10-CM | POA: Insufficient documentation

## 2015-11-30 DIAGNOSIS — M8580 Other specified disorders of bone density and structure, unspecified site: Secondary | ICD-10-CM | POA: Insufficient documentation

## 2015-11-30 DIAGNOSIS — M48061 Spinal stenosis, lumbar region without neurogenic claudication: Secondary | ICD-10-CM

## 2015-11-30 DIAGNOSIS — M48062 Spinal stenosis, lumbar region with neurogenic claudication: Secondary | ICD-10-CM

## 2015-11-30 DIAGNOSIS — M47816 Spondylosis without myelopathy or radiculopathy, lumbar region: Secondary | ICD-10-CM

## 2015-11-30 MED ORDER — OXYCODONE HCL 5 MG PO TABS: ORAL_TABLET | ORAL | 0 refills | Status: DC

## 2015-11-30 NOTE — Patient Instructions (Signed)
PLAN   Continue present medication oxycodone. Please discuss decreasing and discontinuing the use of Xanax with  Dr. Brunetta Genera  F/U PCP Dr. Brunetta Genera for evaliation of  BP, decreasing and discontinuing Xanax, and general medical  condition as we discussed  F/U surgical evaluation.  The patient is with prior surgical evaluation without plans for surgical intervention for lumbar and lower extremity pain and paresthesias. The patient will follow-up with Dr. Mack Guise for follow-up evaluation and treatment of fracture of the left upper extremity as needed F/U neurosurgical evaluation of patient's lumbar and lower extremity pain and weakness for recommendations regarding any treatment such as braces and other devices which may help prevent progression of patient's condition and have avoid falling or any other injuries as discussed  Ask the nurses and secretary the date of your neurosurgical evaluation prior to discharge today  F/U neurological evaluation. May consider pending follow-up evaluations. May consider additional studies . At the present time we will avoid PNCV/EMG studies and other studies  May consider radiofrequency rhizolysis or intraspinal procedures pending response to present treatment and F/U evaluation   Patient to call Pain Management Center should patient have concerns prior to scheduled return appointment

## 2015-11-30 NOTE — Progress Notes (Signed)
Patient here today for medication management.  Safety precautions to be maintained throughout the outpatient stay will include: orient to surroundings, keep bed in low position, maintain call bell within reach at all times, provide assistance with transfer out of bed and ambulation.

## 2015-12-02 ENCOUNTER — Ambulatory Visit: Payer: Medicare Other | Admitting: Pain Medicine

## 2015-12-02 NOTE — Progress Notes (Signed)
    The patient is a 75 year old female who returns to pain management for further evaluation and treatment of lower back lower extremity pain. The patient is with pain well-controlled at this time. The patient is able to perform activities of daily living as well as as obtain restful sleep without significant pain interfering with activities of daily living we discussed patient's condition and will continue Roxicodone at this time and consider additional modifications of treatment pending response to treatment and follow-up evaluation. The patient is undergone surgical evaluation and is without plans for surgical intervention. All were in agreement with suggested treatment plan.       Physical examination  There was tenderness of the splenius capitis and occipitalis region a mild degree there was mild tenderness of the cervical and thoracic facet region. Palpation over the acromioclavicular and glenohumeral joint regions reproduces moderate discomfort and patient had moderate difficulty attempting to perform drop test. There was unremarkable Spurling's maneuver. Palpation of the thoracic region was with out evidence of crepitus. Palpation over the lumbar region reveal patient to be with tenderness to palpation of moderate degree with lateral bending rotation extension and palpation over the lumbar facets reproducing moderate discomfort. Straight leg raising was tolerated to 20 without increased pain with dorsiflexion. No definite sensory deficit of dermatomal distribution was detected. There was tenderness of the knees and crepitus of the knees. There was unremarkable anterior and posterior drawer signs. Palpation of the PSIS and PII S region reproduced moderate discomfort. There was moderate tenderness of the greater trochanteric region and iliotibial band region. There was negative clonus negative Homans. Abdomen nontender with no costovertebral tenderness  noted.     Assessment    Degenerative disc disease lumbar spine Degenerative changes L2-L3 L3-L4 with disc bulging and L3-4 facet arthropathy, severe spinal canal stenosis L4-L5 disc bulging and multilevel facet arthropathy, osteopenia, scoliosis, severe spinal canal stenosis L4-L5 disc bulging and arthropathy, retrolisthesis on the right L2-3 and L3-4  Lumbar radiculopathy   Lumbar stenosis with neurogenic claudication  Lumbar facet syndrome  Spinal stenosis with neurogenic claudication  Sacroiliac joint dysfunction    PLAN   Continue present medication oxycodone. Please discuss decreasing and discontinuing the use of Xanax with  Dr. Brunetta Genera  F/U PCP Dr. Brunetta Genera for evaliation of  BP, decreasing and discontinuing Xanax, and general medical  condition as we discussed  F/U surgical evaluation.  The patient is with prior surgical evaluation without plans for surgical intervention for lumbar and lower extremity pain and paresthesias. The patient will follow-up with Dr. Mack Guise for follow-up evaluation and treatment of fracture of the left upper extremity as needed F/U neurosurgical evaluation of patient's lumbar and lower extremity pain and weakness for recommendations regarding any treatment such as braces and other devices which may help prevent progression of patient's condition and have avoid falling or any other injuries as discussed  Ask the nurses and secretary the date of your neurosurgical evaluation prior to discharge today  F/U neurological evaluation. May consider pending follow-up evaluations. May consider additional studies . At the present time we will avoid PNCV/EMG studies and other studies  May consider radiofrequency rhizolysis or intraspinal procedures pending response to present treatment and F/U evaluation   Patient to call Pain Management Center should patient have concerns prior to scheduled return appointment

## 2016-04-11 DIAGNOSIS — Z79899 Other long term (current) drug therapy: Secondary | ICD-10-CM | POA: Insufficient documentation

## 2016-04-11 DIAGNOSIS — F1190 Opioid use, unspecified, uncomplicated: Secondary | ICD-10-CM | POA: Insufficient documentation

## 2016-04-11 DIAGNOSIS — Z79891 Long term (current) use of opiate analgesic: Secondary | ICD-10-CM | POA: Insufficient documentation

## 2016-04-11 DIAGNOSIS — G894 Chronic pain syndrome: Secondary | ICD-10-CM | POA: Insufficient documentation

## 2016-04-11 NOTE — Progress Notes (Signed)
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 joint RFA    Provider-requested follow-up: Return for 2nd Visit, test result(s), evaluation of new medication trial/regimen.  Future Appointments Date Time Provider Dailey  04/12/2016 12:40 PM [facility]    Primary Care Physician: Lorelee Market, MD Location: [facility] Note by: Kathlen Brunswick. Dossie Arbour, M.D, DABA, DABAPM, DABPM, DABIPP, FIPP Date: 04/12/2016; Time: 12:37 PM  Pain Score Disclaimer: We use the NRS-11 scale. This is a self-reported, subjective measurement of pain severity with only modest accuracy. It is used primarily to identify changes within a particular patient. It must be understood that outpatient pain scales are significantly less accurate that those used for research, where they can be applied under ideal controlled circumstances with minimal exposure to variables. In reality, the score is likely to be a combination of pain intensity and pain affect, where pain affect describes the degree of emotional  arousal or changes in action readiness caused by the sensory experience of pain. Factors such as social and work situation, setting, emotional state, anxiety levels, expectation, and prior pain experience may influence pain perception and show large inter-individual differences that may also be affected by time variables.  Patient instructions provided during this appointment: Patient Instructions   Initial Gabapentin Titration  Medication used: Gabapentin (Generic Name) or Neurontin (Brand Name) 100 mg tablets/capsules  Reasons to stop increasing the dose:  Reason 1: You get good relief of symptoms, in which case there is no need to increase the daily dose  any further.    Reason 2: You develop some side effects, such as sleeping all of the time, difficulty concentrating, or becoming disoriented, in which case you need to go down on the dose, to the prior level, where you were not experiencing any side effects. Stay on that dose longer, to allow more time for your body to get use it, before attempting to increase it again.   Steps: Step 1: Start by taking 1 (one) tablet at bedtime x 7 (seven) days.  Step 2: After being on 1 (one) tablet for 7 (seven) days, then increase it to 2 (two) tablets at bedtime for another 7 (seven) days.  Step 3: Next, after being on 2 (two) tablets at bedtime for 7 (seven) days, then increase it to 3 (three) tablets at bedtime, and stay on that dose until you see your doctor.  Reasons to stop increasing the dose: Reason 1: You get good relief of symptoms, in which case there is no need to increase the daily dose any further.  Reason 2: You develop some side effects, such as sleeping all of the time, difficulty concentrating, or becoming disoriented, in which case you need to go down on the dose, to the prior level, where you were not experiencing any side effects. Stay on that dose longer, to allow more time for your body to get use it, before attempting to  increase it again.  Endpoint: Once you have reached the maximum dose you can tolerate without side-effects, contact your physician so as to evaluate the results of the regimen.   Questions: Feel free to contact us for any questions or problems at [phone]  A prescription for Gabapentin was sent to your pharmacy. Please get your labs and x-rays done today, if possible.

## 2016-04-12 ENCOUNTER — Encounter: Payer: Self-pay | Admitting: Pain Medicine

## 2016-04-12 ENCOUNTER — Ambulatory Visit (HOSPITAL_BASED_OUTPATIENT_CLINIC_OR_DEPARTMENT_OTHER): Payer: Medicare Other | Admitting: Pain Medicine

## 2016-04-12 ENCOUNTER — Other Ambulatory Visit
Admission: RE | Admit: 2016-04-12 | Discharge: 2016-04-12 | Disposition: A | Discharge status: Home / Self Care | Payer: Medicare Other | Source: Ambulatory Visit | Attending: Pain Medicine | Admitting: Pain Medicine

## 2016-04-12 ENCOUNTER — Ambulatory Visit
Admission: RE | Admit: 2016-04-12 | Discharge: 2016-04-12 | Disposition: A | Discharge status: Home / Self Care | Payer: Medicare Other | Source: Ambulatory Visit | Attending: Pain Medicine | Admitting: Pain Medicine

## 2016-04-12 ENCOUNTER — Ambulatory Visit: Admission: RE | Admit: 2016-04-12 | Payer: Self-pay | Source: Ambulatory Visit | Admitting: Pain Medicine

## 2016-04-12 VITALS — BP 133/86 | HR 88 | Temp 98.2°F | Resp 14 | Ht 67.0 in | Wt 135.0 lb

## 2016-04-12 DIAGNOSIS — M1288 Other specific arthropathies, not elsewhere classified, other specified site: Secondary | ICD-10-CM | POA: Diagnosis not present

## 2016-04-12 DIAGNOSIS — M5416 Radiculopathy, lumbar region: Secondary | ICD-10-CM | POA: Diagnosis not present

## 2016-04-12 DIAGNOSIS — M5136 Other intervertebral disc degeneration, lumbar region: Secondary | ICD-10-CM

## 2016-04-12 DIAGNOSIS — M5410 Radiculopathy, site unspecified: Secondary | ICD-10-CM

## 2016-04-12 DIAGNOSIS — M533 Sacrococcygeal disorders, not elsewhere classified: Secondary | ICD-10-CM

## 2016-04-12 DIAGNOSIS — M542 Cervicalgia: Secondary | ICD-10-CM

## 2016-04-12 DIAGNOSIS — M5116 Intervertebral disc disorders with radiculopathy, lumbar region: Secondary | ICD-10-CM | POA: Insufficient documentation

## 2016-04-12 DIAGNOSIS — F1190 Opioid use, unspecified, uncomplicated: Secondary | ICD-10-CM | POA: Diagnosis not present

## 2016-04-12 DIAGNOSIS — G894 Chronic pain syndrome: Secondary | ICD-10-CM | POA: Diagnosis present

## 2016-04-12 DIAGNOSIS — F1390 Sedative, hypnotic, or anxiolytic use, unspecified, uncomplicated: Secondary | ICD-10-CM | POA: Diagnosis not present

## 2016-04-12 DIAGNOSIS — M48061 Spinal stenosis, lumbar region without neurogenic claudication: Secondary | ICD-10-CM | POA: Diagnosis not present

## 2016-04-12 DIAGNOSIS — G8929 Other chronic pain: Secondary | ICD-10-CM

## 2016-04-12 DIAGNOSIS — M25532 Pain in left wrist: Secondary | ICD-10-CM

## 2016-04-12 DIAGNOSIS — R531 Weakness: Secondary | ICD-10-CM

## 2016-04-12 DIAGNOSIS — Z9889 Other specified postprocedural states: Secondary | ICD-10-CM | POA: Diagnosis not present

## 2016-04-12 DIAGNOSIS — M47816 Spondylosis without myelopathy or radiculopathy, lumbar region: Secondary | ICD-10-CM

## 2016-04-12 DIAGNOSIS — M79604 Pain in right leg: Secondary | ICD-10-CM

## 2016-04-12 DIAGNOSIS — T402X5A Adverse effect of other opioids, initial encounter: Secondary | ICD-10-CM

## 2016-04-12 DIAGNOSIS — Z79899 Other long term (current) drug therapy: Secondary | ICD-10-CM

## 2016-04-12 DIAGNOSIS — M48062 Spinal stenosis, lumbar region with neurogenic claudication: Secondary | ICD-10-CM | POA: Diagnosis not present

## 2016-04-12 DIAGNOSIS — M5441 Lumbago with sciatica, right side: Secondary | ICD-10-CM

## 2016-04-12 DIAGNOSIS — Z79891 Long term (current) use of opiate analgesic: Secondary | ICD-10-CM | POA: Diagnosis not present

## 2016-04-12 DIAGNOSIS — Z0001 Encounter for general adult medical examination with abnormal findings: Secondary | ICD-10-CM | POA: Diagnosis not present

## 2016-04-12 DIAGNOSIS — M5442 Lumbago with sciatica, left side: Secondary | ICD-10-CM

## 2016-04-12 DIAGNOSIS — M4726 Other spondylosis with radiculopathy, lumbar region: Secondary | ICD-10-CM | POA: Insufficient documentation

## 2016-04-12 DIAGNOSIS — M79605 Pain in left leg: Secondary | ICD-10-CM

## 2016-04-12 DIAGNOSIS — R413 Other amnesia: Secondary | ICD-10-CM

## 2016-04-12 DIAGNOSIS — K5903 Drug induced constipation: Secondary | ICD-10-CM

## 2016-04-12 DIAGNOSIS — R296 Repeated falls: Secondary | ICD-10-CM

## 2016-04-12 DIAGNOSIS — R29898 Other symptoms and signs involving the musculoskeletal system: Secondary | ICD-10-CM

## 2016-04-12 DIAGNOSIS — M792 Neuralgia and neuritis, unspecified: Secondary | ICD-10-CM | POA: Diagnosis not present

## 2016-04-12 DIAGNOSIS — M51369 Other intervertebral disc degeneration, lumbar region without mention of lumbar back pain or lower extremity pain: Secondary | ICD-10-CM

## 2016-04-12 LAB — COMPREHENSIVE METABOLIC PANEL
ALT: 26 U/L (ref 14–54)
AST: 34 U/L (ref 15–41)
Albumin: 4.6 g/dL (ref 3.5–5.0)
Alkaline Phosphatase: 64 U/L (ref 38–126)
Anion gap: 8 (ref 5–15)
BUN: 12 mg/dL (ref 6–20)
CO2: 30 mmol/L (ref 22–32)
Calcium: 9.5 mg/dL (ref 8.9–10.3)
Chloride: 103 mmol/L (ref 101–111)
Creatinine, Ser: 0.99 mg/dL (ref 0.44–1.00)
GFR calc Af Amer: >60 mL/min (ref >60)
GFR calc non Af Amer: 54 mL/min — ABNORMAL LOW (ref >60)
Glucose, Bld: 107 mg/dL — ABNORMAL HIGH (ref 65–99)
Potassium: 3.6 mmol/L (ref 3.5–5.1)
Sodium: 141 mmol/L (ref 135–145)
Total Bilirubin: 0.5 mg/dL (ref 0.3–1.2)
Total Protein: 7.1 g/dL (ref 6.5–8.1)

## 2016-04-12 LAB — MAGNESIUM: Magnesium: 2.1 mg/dL (ref 1.7–2.4)

## 2016-04-12 LAB — VITAMIN B12: Vitamin B-12: 1406 pg/mL — ABNORMAL HIGH (ref 180–914)

## 2016-04-12 LAB — C-REACTIVE PROTEIN: CRP: <0.8 mg/dL (ref <1.0)

## 2016-04-12 LAB — SEDIMENTATION RATE: Sed Rate: 6 mm/hr (ref 0–30)

## 2016-04-12 MED ORDER — GABAPENTIN 100 MG PO CAPS: 100.0000 mg | ORAL_CAPSULE | Freq: Every day | ORAL | 0 refills | Status: DC

## 2016-04-12 NOTE — Progress Notes (Signed)
Safety precautions to be maintained throughout the outpatient stay will include: orient to surroundings, keep bed in low position, maintain call bell within reach at all times, provide assistance with transfer out of bed and ambulation.  

## 2016-04-12 NOTE — Patient Instructions (Addendum)
Initial Gabapentin Titration  Medication used: Gabapentin (Generic Name) or Neurontin (Brand Name) 100 mg tablets/capsules  Reasons to stop increasing the dose:  Reason 1: You get good relief of symptoms, in which case there is no need to increase the daily dose any further.    Reason 2: You develop some side effects, such as sleeping all of the time, difficulty concentrating, or becoming disoriented, in which case you need to go down on the dose, to the prior level, where you were not experiencing any side effects. Stay on that dose longer, to allow more time for your body to get use it, before attempting to increase it again.   Steps: Step 1: Start by taking 1 (one) tablet at bedtime x 7 (seven) days.  Step 2: After being on 1 (one) tablet for 7 (seven) days, then increase it to 2 (two) tablets at bedtime for another 7 (seven) days.  Step 3: Next, after being on 2 (two) tablets at bedtime for 7 (seven) days, then increase it to 3 (three) tablets at bedtime, and stay on that dose until you see your doctor.  Reasons to stop increasing the dose: Reason 1: You get good relief of symptoms, in which case there is no need to increase the daily dose any further.  Reason 2: You develop some side effects, such as sleeping all of the time, difficulty concentrating, or becoming disoriented, in which case you need to go down on the dose, to the prior level, where you were not experiencing any side effects. Stay on that dose longer, to allow more time for your body to get use it, before attempting to increase it again.  Endpoint: Once you have reached the maximum dose you can tolerate without side-effects, contact your physician so as to evaluate the results of the regimen.   Questions: Feel free to contact us for any questions or problems at [phone]  A prescription for Gabapentin was sent to your pharmacy. Please get your labs and x-rays done today, if possible.

## 2016-04-14 ENCOUNTER — Ambulatory Visit: Payer: Medicare Other | Admitting: Pain Medicine

## 2016-04-15 LAB — 25-HYDROXY VITAMIN D LCMS D2+D3
25-Hydroxy, Vitamin D-2: <1 ng/mL
25-Hydroxy, Vitamin D-3: 49 ng/mL
25-Hydroxy, Vitamin D: 49 ng/mL

## 2016-04-17 LAB — COMPLIANCE DRUG ANALYSIS, UR

## 2016-04-29 ENCOUNTER — Ambulatory Visit
Admission: RE | Admit: 2016-04-29 | Discharge: 2016-04-29 | Disposition: A | Discharge status: Home / Self Care | Payer: Medicare Other | Source: Ambulatory Visit | Attending: Pain Medicine | Admitting: Pain Medicine

## 2016-04-29 DIAGNOSIS — M48062 Spinal stenosis, lumbar region with neurogenic claudication: Secondary | ICD-10-CM | POA: Diagnosis present

## 2016-04-29 DIAGNOSIS — M48061 Spinal stenosis, lumbar region without neurogenic claudication: Secondary | ICD-10-CM | POA: Diagnosis present

## 2016-04-29 DIAGNOSIS — I700 Atherosclerosis of aorta: Secondary | ICD-10-CM | POA: Diagnosis not present

## 2016-04-29 DIAGNOSIS — M5416 Radiculopathy, lumbar region: Secondary | ICD-10-CM | POA: Diagnosis not present

## 2016-05-10 ENCOUNTER — Encounter: Payer: Self-pay | Admitting: Pain Medicine

## 2016-05-10 ENCOUNTER — Ambulatory Visit: Payer: Medicare Other | Attending: Pain Medicine | Admitting: Pain Medicine

## 2016-05-10 VITALS — BP 119/75 | HR 87 | Temp 98.1°F | Resp 16 | Ht 66.0 in | Wt 135.0 lb

## 2016-05-10 DIAGNOSIS — F1190 Opioid use, unspecified, uncomplicated: Secondary | ICD-10-CM | POA: Diagnosis not present

## 2016-05-10 DIAGNOSIS — M25532 Pain in left wrist: Secondary | ICD-10-CM

## 2016-05-10 DIAGNOSIS — G8929 Other chronic pain: Secondary | ICD-10-CM | POA: Diagnosis not present

## 2016-05-10 DIAGNOSIS — Z79899 Other long term (current) drug therapy: Secondary | ICD-10-CM | POA: Insufficient documentation

## 2016-05-10 DIAGNOSIS — M792 Neuralgia and neuritis, unspecified: Secondary | ICD-10-CM | POA: Diagnosis not present

## 2016-05-10 DIAGNOSIS — Z79891 Long term (current) use of opiate analgesic: Secondary | ICD-10-CM | POA: Insufficient documentation

## 2016-05-10 DIAGNOSIS — Z880 Allergy status to penicillin: Secondary | ICD-10-CM | POA: Diagnosis not present

## 2016-05-10 DIAGNOSIS — M1288 Other specific arthropathies, not elsewhere classified, other specified site: Secondary | ICD-10-CM | POA: Diagnosis not present

## 2016-05-10 DIAGNOSIS — M48061 Spinal stenosis, lumbar region without neurogenic claudication: Secondary | ICD-10-CM | POA: Diagnosis not present

## 2016-05-10 DIAGNOSIS — G894 Chronic pain syndrome: Secondary | ICD-10-CM | POA: Diagnosis not present

## 2016-05-10 DIAGNOSIS — M5442 Lumbago with sciatica, left side: Secondary | ICD-10-CM | POA: Diagnosis not present

## 2016-05-10 DIAGNOSIS — M5441 Lumbago with sciatica, right side: Secondary | ICD-10-CM

## 2016-05-10 DIAGNOSIS — M47816 Spondylosis without myelopathy or radiculopathy, lumbar region: Secondary | ICD-10-CM

## 2016-05-10 DIAGNOSIS — Z5189 Encounter for other specified aftercare: Secondary | ICD-10-CM | POA: Insufficient documentation

## 2016-05-10 DIAGNOSIS — M5410 Radiculopathy, site unspecified: Secondary | ICD-10-CM | POA: Diagnosis not present

## 2016-05-10 DIAGNOSIS — M48062 Spinal stenosis, lumbar region with neurogenic claudication: Secondary | ICD-10-CM

## 2016-05-10 DIAGNOSIS — Z90710 Acquired absence of both cervix and uterus: Secondary | ICD-10-CM | POA: Diagnosis not present

## 2016-05-10 DIAGNOSIS — M79605 Pain in left leg: Secondary | ICD-10-CM | POA: Insufficient documentation

## 2016-05-10 DIAGNOSIS — Z9889 Other specified postprocedural states: Secondary | ICD-10-CM | POA: Insufficient documentation

## 2016-05-10 DIAGNOSIS — M79604 Pain in right leg: Secondary | ICD-10-CM | POA: Diagnosis not present

## 2016-05-10 MED ORDER — GABAPENTIN 300 MG PO CAPS: 300.0000 mg | ORAL_CAPSULE | Freq: Every day | ORAL | 1 refills | Status: DC

## 2016-05-10 NOTE — Progress Notes (Signed)
Patient's Name: Kristi [name]  MRN: [id number]  Referring Provider: Lorelee Market, MD  DOB: 07-23-1940  PCP: Lorelee Market, MD  DOS: 05/10/2016  Note by: Kathlen Brunswick. Dossie Arbour, MD  Service setting: Ambulatory outpatient  Specialty: Interventional Pain Management  Location: [facility] (AMB) Pain Management Facility    Patient type: Established   Primary Reason(s) for Visit: Encounter for evaluation before starting new chronic pain management plan of care (Level of risk: moderate) CC: Back Pain (lower)  HPI  Kristi [name] is a 76 y.o. year old, female patient, who comes today for a follow-up evaluation to review the test results and decide on a treatment plan. She has Lumbar spinal stenosis; Sacroiliac joint dysfunction (Bilateral); Lumbar facet syndrome (Bilateral) (R>L); Lumbar radiculopathy; Anxiety and depression; CAD (coronary artery disease); Therapeutic opioid-induced constipation (OIC); Frequent falls; GERD (gastroesophageal reflux disease); Memory loss; Chronic pain syndrome; Long term current use of opiate analgesic; Long term prescription opiate use; Opiate use; Long term prescription benzodiazepine use; Neurogenic pain; Chronic lower extremity pain (Location of Primary Source of Pain) (Bilateral); Chronic lower extremity radicular pain (Location of Primary Source of Pain) (Bilateral) (R>L); Lower extremity weakness; Chronic low back pain (Location of Secondary source of pain) (Bilateral); Chronic wrist pain (Left) (secondary to fracture); and Lumbar spondylosis on her problem list. Her primarily concern today is the Back Pain (lower)  Pain Assessment: Self-Reported Pain Score: 0-No pain/10             Reported level is compatible with observation.       Pain Type: Chronic pain Pain Location: Back Pain Orientation: Lower Pain Descriptors / Indicators: Cramping Pain Frequency: Intermittent  Kristi [name] comes in today for a follow-up visit after her initial evaluation on 04/12/2016. Today  we went over the results of her tests. These were explained in "Layman's terms". During today's appointment we went over my diagnostic impression, as well as the proposed treatment plan. The patient returns to the clinics today after having been started on Neurontin 100 mg by mouth at bedtime with instructions to go up as tolerated. She indicates that she is already at 300 mg by mouth at bedtime and she is currently having no pain. In view of this, I will change the formulation to 1 300 mg pill and continue this for the foreseeable future. I have provided her with a six-month prescription. In addition, today we went over the results of her x-rays and lab work and I have provided her with this some PRN orders for lumbar epidural steroid injections, should she need them.  In considering the treatment plan options, Kristi [name] was reminded that I no longer take patients for medication management only. I asked her to let me know if she had no intention of taking advantage of the interventional therapies, so that we could make arrangements to provide this space to someone interested. I also made it clear that undergoing interventional therapies for the purpose of getting pain medications is very inappropriate on the part of a patient, and it will not be tolerated in this practice. This type of behavior would suggest true addiction and therefore it requires referral to an addiction specialist.   Further details on both, my assessment(s), as well as the proposed treatment plan, please see below. Controlled Substance Pharmacotherapy Assessment REMS (Risk Evaluation and Mitigation Strategy)  Analgesic: Oxycodone 5 mg 1 tablet by mouth 4 times a day MME/day: 30 mg/day Pill Count: None expected due to no prior prescriptions written by our practice. Pharmacokinetics:  Liberation and absorption (onset of action): WNL Distribution (time to peak effect): WNL Metabolism and excretion (duration of action): WNL          Pharmacodynamics: Desired effects: Analgesia: Kristi [name] reports >50% benefit. Functional ability: Patient reports that medication allows her to accomplish basic ADLs Clinically meaningful improvement in function (CMIF): Sustained CMIF goals met Perceived effectiveness: Described as relatively effective, allowing for increase in activities of daily living (ADL) Undesirable effects: Side-effects or Adverse reactions: None reported Monitoring: [place] PMP: Online review of the past 48-monthperiod previously conducted. Not applicable at this point since we have not taken over the patient's medication management yet. List of all UDS test(s) done:  Lab Results  Component Value Date   TOXASSSELUR FINAL 08/04/2015   SUMMARY FINAL 04/12/2016   Last UDS on record: ToxAssure Select 13  Date Value Ref Range Status  08/04/2015 FINAL  Final    Comment:    ==================================================================== TOXASSURE SELECT 13 (MW) ==================================================================== Test                             Result       Flag       Units Drug Present and Declared for Prescription Verification   Oxycodone                      524          EXPECTED   ng/mg creat   Noroxycodone                   2459         EXPECTED   ng/mg creat    Sources of oxycodone include scheduled prescription medications.    Noroxycodone is an expected metabolite of oxycodone. Drug Absent but Declared for Prescription Verification   Alprazolam                     Not Detected UNEXPECTED ng/mg creat   Tramadol                       Not Detected UNEXPECTED ==================================================================== Test                      Result    Flag   Units      Ref Range   Creatinine              51               mg/dL      >=20 ==================================================================== Declared Medications:  The flagging and interpretation on this report  are based on the  following declared medications.  Unexpected results may arise from  inaccuracies in the declared medications.  **Note: The testing scope of this panel includes these medications:  Alprazolam (Xanax)  Oxycodone (Roxicodone)  Tramadol (Ultram)  **Note: The testing scope of this panel does not include following  reported medications:  Ciprofloxacin (Cipro)  Docusate (Colace)  Furosemide (Lasix)  Levothyroxine (Synthroid)  Linaclotide (Linzess)  Loratadine (Claritin)  Multivitamin (MVI)  Omeprazole (Prilosec)  Potassium (Klor-Con)  Rosuvastatin (Crestor)  Sertraline (Zoloft)  Thiothixene (Navane)  Trazodone (Desyrel)  Vitamin B1  Vitamin B12  Vitamin D ==================================================================== For clinical consultation, please call ([phone] ====================================================================    Summary  Date Value Ref Range Status  04/12/2016 FINAL  Final    Comment:    ==================================================================== TOXASSURE COMP  DRUG ANALYSIS,UR ==================================================================== Test                             Result       Flag       Units Drug Present and Declared for Prescription Verification   Lamotrigine                    PRESENT      EXPECTED   Sertraline                     PRESENT      EXPECTED   Desmethylsertraline            PRESENT      EXPECTED    Desmethylsertraline is an expected metabolite of sertraline.   Trazodone                      PRESENT      EXPECTED   1,3 chlorophenyl piperazine    PRESENT      EXPECTED    1,3-chlorophenyl piperazine is an expected metabolite of    trazodone. Drug Present not Declared for Prescription Verification   Acetaminophen                  PRESENT      UNEXPECTED   Chlorpheniramine               PRESENT      UNEXPECTED Drug Absent but Declared for Prescription Verification   Alprazolam                      Not Detected UNEXPECTED ng/mg creat   Tramadol                       Not Detected UNEXPECTED   Thiothixene                    Not Detected UNEXPECTED ==================================================================== Test                      Result    Flag   Units      Ref Range   Creatinine              73               mg/dL      >=20 ==================================================================== Declared Medications:  The flagging and interpretation on this report are based on the  following declared medications.  Unexpected results may arise from  inaccuracies in the declared medications.  **Note: The testing scope of this panel includes these medications:  Alprazolam  Lamotrigine  Sertraline  Thiothixene  Tramadol  Trazodone  **Note: The testing scope of this panel does not include following  reported medications:  Carbidopa (Carbidopa/Levodopa)  Cholecalciferol  Docusate  Fluticasone  Furosemide  Levodopa (Carbidopa/Levodopa)  Levothyroxine  Linaclotide  Loratadine  Multivitamin  Olopatadine  Omeprazole  Potassium  Rosuvastatin  Vitamin B12 ==================================================================== For clinical consultation, please call [phone]. ====================================================================    UDS interpretation: No unexpected findings.          Medication Assessment Form: Patient introduced to form today Treatment compliance: Treatment may start today if patient agrees with proposed plan. Evaluation of compliance is not applicable at this point Risk Assessment Profile: Aberrant behavior: See initial evaluations. None observed or detected today Comorbid factors increasing  risk of overdose: See initial evaluation. No additional risks detected today Risk Mitigation Strategies:  Patient opioid safety counseling: Completed today. Counseling provided to patient as per "Patient Counseling Document". Document  signed by patient, attesting to counseling and understanding Patient-Prescriber Agreement (PPA): Obtained today  Controlled substance notification to other providers: Written and sent today  Pharmacologic Plan: Today we may be taking over the patient's pharmacological regimen. See below  Laboratory Chemistry  Inflammation Markers Lab Results  Component Value Date   ESRSEDRATE 6 04/12/2016   CRP <0.8 04/12/2016   Renal Function Lab Results  Component Value Date   BUN 12 04/12/2016   CREATININE 0.99 04/12/2016   GFRAA >60 04/12/2016   GFRNONAA 54 (L) 04/12/2016   Hepatic Function Lab Results  Component Value Date   AST 34 04/12/2016   ALT 26 04/12/2016   ALBUMIN 4.6 04/12/2016   Electrolytes Lab Results  Component Value Date   NA 141 04/12/2016   K 3.6 04/12/2016   CL 103 04/12/2016   CALCIUM 9.5 04/12/2016   MG 2.1 04/12/2016   Pain Modulating Vitamins Lab Results  Component Value Date   25OHVITD1 49 04/12/2016   25OHVITD2 <1.0 04/12/2016   25OHVITD3 49 04/12/2016   VITAMINB12 1,406 (H) 04/12/2016   Coagulation Parameters Lab Results  Component Value Date   PLT 163 02/19/2014   Cardiovascular Lab Results  Component Value Date   HGB 11.9 (L) 02/19/2014   HCT 37.0 02/19/2014   Note: Lab results reviewed.  Recent Diagnostic Imaging Review  Ct Lumbar Spine Wo Contrast Result Date: 04/29/2016 CLINICAL DATA:  Low back pain radiating down both legs for 2 years. No known injuries. Unable to have MRI due to pacemaker. EXAM: CT LUMBAR SPINE WITHOUT CONTRAST TECHNIQUE: Multidetector CT imaging of the lumbar spine was performed without intravenous contrast administration. Multiplanar CT image reconstructions were also generated. COMPARISON:  Plain film of the sacroiliac joints dated 04/12/2014. FINDINGS: Segmentation: 5 lumbar type vertebrae. Alignment: Approximately 35 degrees levoscoliosis centered at the L3 vertebral body level, stable compared to the previous plain  film exam. 7 mm anterior displacement of the L4 vertebral body, likely related to underlying degenerative change and/or scoliosis. No evidence of acute subluxation. Vertebrae: No fracture line or displaced fracture fragment identified. No acute or suspicious osseous lesion. Paraspinal and other soft tissues: Aortic atherosclerosis. Otherwise unremarkable. Disc levels: L5-S1: Mild disc bulge. No significant central canal or neural foramen narrowing. L4-5: Diffuse broad-based disc bulge and prominent degenerative facet hypertrophy combining to cause a severe central canal stenosis. Also prominent bilateral lateral recess narrowings with possible associated nerve root impingement. Mild bilateral neural foramen stenoses. L3-4: Diffuse disc bulge degenerative and degenerative facet hypertrophy combining to cause a moderate to severe central canal stenosis and moderate to severe bilateral neural foramen stenoses with possible associated nerve root impingement. L2-3: Mild disc bulge. Prominent degenerative facet hypertrophy, right greater than left, causing moderate central canal stenosis, severe right neural foramen stenosis and at least some degree of right lateral recess narrowing, with almost certain right-sided nerve root impingement. L1-2: Disc space is relatively well preserved. No significant central canal or neural foramen stenosis. IMPRESSION: 1. Degenerative changes, as detailed above. These degenerative changes result in severe central canal stenosis at L4-5, moderate to severe central canal stenosis at L3-4, moderate central canal stenosis at L2-3, and moderate to severe neural foramen stenoses and lateral recess stenoses at multiple levels (including severe right neural foramen stenosis and lateral recess stenosis at L2-3) with probable associated  nerve root impingements at multiple levels. 2. 35 degrees levoscoliosis centered at the L3 vertebral body level. 3. 7 mm anterior displacement of the L4 vertebral  body, almost certainly related to the underlying degenerative change and scoliosis. 4. No acute findings. 5. Aortic atherosclerosis. Electronically Signed   By: Franki Cabot M.D.   On: 04/29/2016 10:25   Cervical Imaging: Cervical DG complete:  Results for orders placed during the hospital encounter of 04/12/16  DG Cervical Spine Complete   Narrative CLINICAL DATA:  Chronic back pain, spinal stenosis  EXAM: CERVICAL SPINE - COMPLETE 4+ VIEW  COMPARISON:  CT scan 05/17/2014  FINDINGS: Six views of the cervical spine submitted. No acute fracture or subluxation. Mild degenerative changes are noted C1-C2 articulation. Minimal disc space flattening at C3-C4 level. Mild disc space flattening with mild anterior spurring at C5-C6 C6-C7 and C7-T1 level. Again noted about 2 mm anterolisthesis C7 on T1 vertebral body. No prevertebral soft tissue swelling. Cervical airway is patent.  IMPRESSION: No cervical spine acute fracture or subluxation. Degenerative changes as described above. About 2 mm anterolisthesis C7 on T1 vertebral body.   Electronically Signed   By: Lahoma Crocker M.D.   On: 04/12/2016 14:04    Shoulder Imaging: Shoulder-L DG:  Results for orders placed in visit on 02/19/14  DG Shoulder Left   Narrative * PRIOR REPORT IMPORTED FROM AN EXTERNAL SYSTEM *   CLINICAL DATA:  Fall from standing position, left shoulder pain   EXAM:  DG SHOULDER 3+VIEWS LEFT   COMPARISON:  None.   FINDINGS:  Three views of the left shoulder submitted. There is mild displaced  fracture of distal clavicle. Glenohumeral joint is preserved.  Partially visualized dual lead cardiac pacemaker.   IMPRESSION:  Mild displaced fracture of distal left clavicle. Glenohumeral joint  is preserved.    Electronically Signed    By: Lahoma Crocker M.D.    On: 02/19/2014 20:09       Lumbosacral Imaging: Lumbar CT wo contrast:  Results for orders placed during the hospital encounter of 04/29/16  CT  LUMBAR SPINE WO CONTRAST   Narrative CLINICAL DATA:  Low back pain radiating down both legs for 2 years. No known injuries. Unable to have MRI due to pacemaker.  EXAM: CT LUMBAR SPINE WITHOUT CONTRAST  TECHNIQUE: Multidetector CT imaging of the lumbar spine was performed without intravenous contrast administration. Multiplanar CT image reconstructions were also generated.  COMPARISON:  Plain film of the sacroiliac joints dated 04/12/2014.  FINDINGS: Segmentation: 5 lumbar type vertebrae.  Alignment: Approximately 35 degrees levoscoliosis centered at the L3 vertebral body level, stable compared to the previous plain film exam. 7 mm anterior displacement of the L4 vertebral body, likely related to underlying degenerative change and/or scoliosis.  No evidence of acute subluxation.  Vertebrae: No fracture line or displaced fracture fragment identified. No acute or suspicious osseous lesion.  Paraspinal and other soft tissues: Aortic atherosclerosis. Otherwise unremarkable.  Disc levels:  L5-S1: Mild disc bulge. No significant central canal or neural foramen narrowing.  L4-5: Diffuse broad-based disc bulge and prominent degenerative facet hypertrophy combining to cause a severe central canal stenosis. Also prominent bilateral lateral recess narrowings with possible associated nerve root impingement. Mild bilateral neural foramen stenoses.  L3-4: Diffuse disc bulge degenerative and degenerative facet hypertrophy combining to cause a moderate to severe central canal stenosis and moderate to severe bilateral neural foramen stenoses with possible associated nerve root impingement.  L2-3: Mild disc bulge. Prominent degenerative facet hypertrophy, right  greater than left, causing moderate central canal stenosis, severe right neural foramen stenosis and at least some degree of right lateral recess narrowing, with almost certain right-sided nerve root impingement.  L1-2: Disc  space is relatively well preserved. No significant central canal or neural foramen stenosis.  IMPRESSION: 1. Degenerative changes, as detailed above. These degenerative changes result in severe central canal stenosis at L4-5, moderate to severe central canal stenosis at L3-4, moderate central canal stenosis at L2-3, and moderate to severe neural foramen stenoses and lateral recess stenoses at multiple levels (including severe right neural foramen stenosis and lateral recess stenosis at L2-3) with probable associated nerve root impingements at multiple levels. 2. 35 degrees levoscoliosis centered at the L3 vertebral body level. 3. 7 mm anterior displacement of the L4 vertebral body, almost certainly related to the underlying degenerative change and scoliosis. 4. No acute findings. 5. Aortic atherosclerosis.   Electronically Signed   By: Franki Cabot M.D.   On: 04/29/2016 10:25    Lumbar DG Bending views:  Results for orders placed during the hospital encounter of 04/12/16  DG Lumbar Spine Complete W/Bend   Narrative CLINICAL DATA:  Chronic lower back pain.  EXAM: LUMBAR SPINE - COMPLETE WITH BENDING VIEWS  COMPARISON:  None.  FINDINGS: No fracture is noted. Moderate degenerative disc disease is noted at L1-2 and L2-3. Mild retrolisthesis is noted at L3-4. Mild degenerative disc disease is noted at this level as well. Mild anterolisthesis of L4-5 is noted secondary to posterior facet joint hypertrophy. Atherosclerosis of abdominal aorta is noted.  IMPRESSION: Multilevel degenerative disc disease. No acute abnormality seen in the lumbar spine.   Electronically Signed   By: Marijo Conception, M.D.   On: 04/12/2016 14:00    Sacroiliac Joint Imaging: Sacroiliac Joint DG:  Results for orders placed during the hospital encounter of 04/12/16  DG Si Joints   Narrative CLINICAL DATA:  Chronic lower back pain.  EXAM: BILATERAL SACROILIAC JOINTS - 3+ VIEW  COMPARISON:   None.  FINDINGS: The sacroiliac joint spaces are maintained and there is no evidence of arthropathy. No other bone abnormalities are seen.  IMPRESSION: Normal sacroiliac joints.   Electronically Signed   By: Marijo Conception, M.D.   On: 04/12/2016 14:02    Note: Results of ordered imaging test(s) reviewed and explained to patient in Layman's terms. Copy of results provided to patient  Meds  The patient has a current medication list which includes the following prescription(s): alprazolam, carbidopa-levodopa, cholecalciferol, docusate sodium, fluticasone, furosemide, gabapentin, lamotrigine, levothyroxine, linaclotide, loratadine, multivitamin, omeprazole, pataday, potassium chloride, rosuvastatin, sertraline, thiothixene, tramadol, trazodone, and vitamin b-12.  Current Outpatient Prescriptions on File Prior to Visit  Medication Sig  . ALPRAZolam (XANAX) 0.25 MG tablet at bedtime as needed.   . carbidopa-levodopa (SINEMET IR) 25-100 MG tablet Take by mouth daily.   . cholecalciferol (VITAMIN D) 1000 UNITS tablet Take 1,000 Units by mouth daily. Reported on 07/08/2015  . docusate sodium (COLACE) 100 MG capsule Take 100 mg by mouth 2 (two) times daily.  . fluticasone (FLONASE) 50 MCG/ACT nasal spray   . furosemide (LASIX) 20 MG tablet Take 20 mg by mouth daily.   [name] lamoTRIgine (LAMICTAL) 100 MG tablet daily.   [name] levothyroxine (SYNTHROID, LEVOTHROID) 100 MCG tablet Take 100 mcg by mouth daily before breakfast.  . Linaclotide (LINZESS) 145 MCG CAPS capsule Take 145 mcg by mouth daily.  [name] loratadine (CLARITIN) 10 MG tablet Take 10 mg by mouth daily.  . Multiple  Vitamin (MULTIVITAMIN) tablet Take 1 tablet by mouth daily.  [name] omeprazole (PRILOSEC) 20 MG capsule Take 20 mg by mouth daily.  [name] PATADAY 0.2 % SOLN   . potassium chloride (KLOR-CON) 20 MEQ packet Take by mouth 2 (two) times daily.  . rosuvastatin (CRESTOR) 20 MG tablet Take 20 mg by mouth daily.  . sertraline (ZOLOFT) 100 MG  tablet Take 100 mg by mouth daily. i tab in the am and 0.5 tab at bedtime.  [name] thiothixene (NAVANE) 5 MG capsule Take 5 mg by mouth 2 (two) times daily.  . traMADol (ULTRAM) 50 MG tablet Take by mouth every 6 (six) hours as needed.  . traZODone (DESYREL) 50 MG tablet Take 50 mg by mouth at bedtime.   No current facility-administered medications on file prior to visit.    ROS  Constitutional: Denies any fever or chills Gastrointestinal: No reported hemesis, hematochezia, vomiting, or acute GI distress Musculoskeletal: Denies any acute onset joint swelling, redness, loss of ROM, or weakness Neurological: No reported episodes of acute onset apraxia, aphasia, dysarthria, agnosia, amnesia, paralysis, loss of coordination, or loss of consciousness  Allergies  Ms. Bulkley is allergic to penicillin g.  PFSH  Drug: Ms. Stlouis  reports that she does not use drugs. Alcohol:  reports that she does not drink alcohol. Tobacco:  reports that she has never smoked. She has never used smokeless tobacco. Medical:  has a past medical history of Anxiety; Broken arm; Broken arm; Depression; Hyperlipidemia; Hypertension; Hypothyroid; Myocardial infarction; Neck pain (06/04/2014); and Spinal stenosis. Family: family history includes Cancer in her mother; Depression in her mother; Diabetes in her mother; Heart disease in her father; Hypertension in her mother.  Past Surgical History:  Procedure Laterality Date  . ABDOMINAL HYSTERECTOMY    . CORONARY ARTERY BYPASS GRAFT    . CORONARY STENT PLACEMENT    . s/p pacer insertion     Constitutional Exam  General appearance: Well nourished, well developed, and well hydrated. In no apparent acute distress Vitals:   05/10/16 0908  BP: 119/75  Pulse: 87  Resp: 16  Temp: 98.1 F (36.7 C)  TempSrc: Oral  SpO2: 100%  Weight: 135 lb (61.2 kg)  Height: 5' 6" (1.676 m)   BMI Assessment: Estimated body mass index is 21.79 kg/m as calculated from the following:    Height as of this encounter: 5' 6" (1.676 m).   Weight as of this encounter: 135 lb (61.2 kg).  BMI interpretation table: BMI level Category Range association with higher incidence of chronic pain  <18 kg/m2 Underweight   18.5-24.9 kg/m2 Ideal body weight   25-29.9 kg/m2 Overweight Increased incidence by 20%  30-34.9 kg/m2 Obese (Class I) Increased incidence by 68%  35-39.9 kg/m2 Severe obesity (Class II) Increased incidence by 136%  >40 kg/m2 Extreme obesity (Class III) Increased incidence by 254%   BMI Readings from Last 4 Encounters:  05/10/16 21.79 kg/m  04/12/16 21.14 kg/m  11/30/15 22.60 kg/m  11/04/15 21.93 kg/m   Wt Readings from Last 4 Encounters:  05/10/16 135 lb (61.2 kg)  04/12/16 135 lb (61.2 kg)  11/30/15 140 lb (63.5 kg)  11/04/15 140 lb (63.5 kg)  Psych/Mental status: Alert, oriented x 3 (person, place, & time)       Eyes: PERLA Respiratory: No evidence of acute respiratory distress  Cervical Spine Exam  Inspection: No masses, redness, or swelling Alignment: Symmetrical Functional ROM: Unrestricted ROM Stability: No instability detected Muscle strength & Tone: Functionally intact Sensory: Unimpaired Palpation:  Non-contributory  Upper Extremity (UE) Exam    Side: Right upper extremity  Side: Left upper extremity  Inspection: No masses, redness, swelling, or asymmetry. No contractures  Inspection: No masses, redness, swelling, or asymmetry. No contractures  Functional ROM: Unrestricted ROM          Functional ROM: Unrestricted ROM          Muscle strength & Tone: Functionally intact  Muscle strength & Tone: Functionally intact  Sensory: Unimpaired  Sensory: Unimpaired  Palpation: Euthermic  Palpation: Euthermic  Specialized Test(s): Deferred         Specialized Test(s): Deferred          Thoracic Spine Exam  Inspection: No masses, redness, or swelling Alignment: Symmetrical Functional ROM: Unrestricted ROM Stability: No instability detected Sensory:  Unimpaired Muscle strength & Tone: Functionally intact Palpation: Non-contributory  Lumbar Spine Exam  Inspection: No masses, redness, or swelling Alignment: Symmetrical Functional ROM: Unrestricted ROM Stability: No instability detected Muscle strength & Tone: Functionally intact Sensory: Unimpaired Palpation: Non-contributory Provocative Tests: Lumbar Hyperextension and rotation test: evaluation deferred today       Patrick's Maneuver: evaluation deferred today              Gait & Posture Assessment  Ambulation: Unassisted Gait: Relatively normal for age and body habitus Posture: WNL   Lower Extremity Exam    Side: Right lower extremity  Side: Left lower extremity  Inspection: No masses, redness, swelling, or asymmetry. No contractures  Inspection: No masses, redness, swelling, or asymmetry. No contractures  Functional ROM: Unrestricted ROM          Functional ROM: Unrestricted ROM          Muscle strength & Tone: Functionally intact  Muscle strength & Tone: Functionally intact  Sensory: Unimpaired  Sensory: Unimpaired  Palpation: No palpable anomalies  Palpation: No palpable anomalies   Assessment & Plan  Primary Diagnosis & Pertinent Problem List: The primary encounter diagnosis was Chronic lower extremity pain (Location of Primary Source of Pain) (Bilateral). 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  Visit Diagnosis: 1. Chronic lower extremity pain (Location of Primary Source of Pain) (Bilateral)   2. 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 Chronic low back  pain (Location of Secondary source of pain) (Bilateral)   4. Chronic wrist pain (Left) (secondary to fracture)   5. Lumbar facet syndrome (Bilateral) (R>L)   6. Lumbar spinal stenosis   7. Long term current use of opiate analgesic   8. Opiate use   9. Neurogenic pain    Problems updated and reviewed during this visit: Problem  Anxiety and Depression  Neck Pain (Resolved)   Problem-specific Plan(s): No problem-specific Assessment & Plan notes found for this encounter.  Assessment & plan notes cannot be loaded without a specified hospital service.  Plan of Care  Pharmacotherapy (Medications Ordered): Meds ordered this encounter  Medications  . gabapentin (NEURONTIN) 300 MG capsule    Sig: Take 1 capsule (300 mg total) by mouth at bedtime.    Dispense:  90 capsule    Refill:  1    Do not place this medication, or  any other prescription from our practice, on "Automatic Refill". Patient may have prescription filled one day early if pharmacy is closed on scheduled refill date.   Lab-work, procedure(s), and/or referral(s): Orders Placed This Encounter  Procedures  . Lumbar Epidural Injection    Pharmacotherapy: Opioid Analgesics: We'll take over management today. See above orders Membrane stabilizer: We have discussed the possibility of optimizing this mode of therapy, if tolerated Muscle relaxant: We have discussed the possibility of a trial NSAID: We have discussed the possibility of a trial Other analgesic(s): To be determined at a later time   Interventional therapies: Planned, scheduled, and/or pending:    None at this time.    Considering:   Diagnostic lumbar epidural steroid injection under fluoroscopic guidance  Diagnostic bilateral transforaminal epidural steroid injections  Diagnostic bilateral lumbar facet blocks  Possible bilateral lumbar facet RFA  Diagnostic bilateral SI joint blocks  Possible bilateral SI joint RFA    PRN Procedures:   To be determined at  a later time   Provider-requested follow-up: Return in about 6 months (around 11/07/2016) for (Nurse Practitioner) Med-Mgmt, in addition, (PRN) procedure.  No future appointments.  Primary Care Physician: Lorelee Market, MD Location: [facility] Note by: Kathlen Brunswick. Dossie Arbour, M.D, DABA, DABAPM, DABPM, DABIPP, FIPP Date: 05/10/2016; Time: 10:03 AM  Pain Score Disclaimer: We use the NRS-11 scale. This is a self-reported, subjective measurement of pain severity with only modest accuracy. It is used primarily to identify changes within a particular patient. It must be understood that outpatient pain scales are significantly less accurate that those used for research, where they can be applied under ideal controlled circumstances with minimal exposure to variables. In reality, the score is likely to be a combination of pain intensity and pain affect, where pain affect describes the degree of emotional arousal or changes in action readiness caused by the sensory experience of pain. Factors such as social and work situation, setting, emotional state, anxiety levels, expectation, and prior pain experience may influence pain perception and show large inter-individual differences that may also be affected by time variables.  Patient instructions provided during this appointment: Patient Instructions  A prescription for Gabapentin was sent to your pharmacy.

## 2016-05-10 NOTE — Progress Notes (Signed)
Safety precautions to be maintained throughout the outpatient stay will include: orient to surroundings, keep bed in low position, maintain call bell within reach at all times, provide assistance with transfer out of bed and ambulation.  

## 2016-05-10 NOTE — Patient Instructions (Signed)
A prescription for Gabapentin was sent to your pharmacy. 

## 2016-05-17 DIAGNOSIS — G629 Polyneuropathy, unspecified: Secondary | ICD-10-CM | POA: Insufficient documentation

## 2016-06-29 NOTE — Progress Notes (Signed)
Test: Blood sugar (Glucose levels) Finding(s): High (hyperglycemia) Normal Level(s): Normal fasting (NPO x 8 hours) glucose levels are between 65-99 mg/dl, with 2 hour fasting, levels are usually less than 140 mg/dl. Clinical significance: Any random blood glucose level greater than 200 mg/dl is considered to be Diabetes. Signs and symptoms may include: (when persistently above 180 mg/dL) Increased thirst; headaches; trouble concentrating; blurred vision; frequent peeing (urination); fatigue (weakness and tired feeling); weight loss. The most common and classical symptoms of an undiagnosed diabetes with hyperglycemia are: Increased urinary frequency (polyuria), thirst (polydipsia), hunger (polyphagia), and unexplained weight loss; numbness in the extremities, pain in feet (dysesthesias), fatigue, and blurred vision; recurrent or severe infections; loss of consciousness or severe nausea/vomiting (ketoacidosis) or coma. Patient Recommendation(s): Fasting levels above 140 mg/dL or any levels above 200 mg/dL should follow-up with PCP (Primary Care Physician) for further evaluation. ___________________________________________________________________________________  eGFR (Estimated Glomerular Filtration Rate) results are reported as milliliters/minute/1.32m (mL/min/1.772m. Because some laboratories do not collect information on a patient's race when the sample is collected for testing, they may report calculated results for both African Americans and non-African Americans.  The [facility]s only reporting actual results once values are < 60 mL/min. 1. Normal values: 90-120 mL/min 2. Below 60 mL/min suggests that some kidney damage has occurred. 3. Between 5966nd 30 indicate (Moderate) Stage 3 kidney disease. 4. Between 29 and 15 represent (Severe) Stage 4 kidney disease. 5. Less than 15 is considered (Kidney Failure) Stage  5. ___________________________________________________________________________________

## 2016-06-29 NOTE — Progress Notes (Signed)
Normal Vitamin B-12 level(s): are between 180 and 914 pg/mL.  Elevated Vit B-12 level(s): Levels above 914 pg/mL. Possible causes: Taking supplements of vitamin B-12 (cobalamin). Medical conditions that can increase levels of vitamin B12 include: liver disease, kidney failure and myeloproliferative disorders, which includes myelocytic leukemia and polycythemia vera. Recommendations: Stop vitamin B-12 supplements. Contact primary care physician for further evaluation and recommendations. 

## 2016-07-06 NOTE — Progress Notes (Signed)
Results were reviewed and found to be: abnormal  Surgical consultation is recommended  Review would suggest interventional pain management techniques may be of benefit 

## 2016-07-27 ENCOUNTER — Telehealth: Payer: Self-pay | Admitting: Pain Medicine

## 2016-07-27 NOTE — Telephone Encounter (Signed)
Patient called stating she is having leg pains and would like to have injections, she is scheduled for 08-03-16 lumbar epidural and needs to know if she should stop her aspirin. Please call patient with instructions for medications.

## 2016-07-27 NOTE — Telephone Encounter (Signed)
Instructed patient of pre procedure instructions.  Informed patient that she did not have to stop her aspirin.  States understanding.

## 2016-08-03 ENCOUNTER — Encounter: Payer: Self-pay | Admitting: Pain Medicine

## 2016-08-03 ENCOUNTER — Ambulatory Visit (HOSPITAL_BASED_OUTPATIENT_CLINIC_OR_DEPARTMENT_OTHER): Payer: Medicare Other | Admitting: Pain Medicine

## 2016-08-03 ENCOUNTER — Ambulatory Visit
Admission: RE | Admit: 2016-08-03 | Discharge: 2016-08-03 | Disposition: A | Discharge status: Home / Self Care | Payer: Medicare Other | Source: Ambulatory Visit | Attending: Pain Medicine | Admitting: Pain Medicine

## 2016-08-03 VITALS — BP 129/69 | HR 60 | Temp 98.3°F | Resp 16 | Ht 67.0 in | Wt 135.0 lb

## 2016-08-03 DIAGNOSIS — Z950 Presence of cardiac pacemaker: Secondary | ICD-10-CM | POA: Insufficient documentation

## 2016-08-03 DIAGNOSIS — M48062 Spinal stenosis, lumbar region with neurogenic claudication: Secondary | ICD-10-CM

## 2016-08-03 DIAGNOSIS — M5442 Lumbago with sciatica, left side: Secondary | ICD-10-CM | POA: Diagnosis not present

## 2016-08-03 DIAGNOSIS — Z951 Presence of aortocoronary bypass graft: Secondary | ICD-10-CM | POA: Insufficient documentation

## 2016-08-03 DIAGNOSIS — M48061 Spinal stenosis, lumbar region without neurogenic claudication: Secondary | ICD-10-CM | POA: Insufficient documentation

## 2016-08-03 DIAGNOSIS — M5441 Lumbago with sciatica, right side: Secondary | ICD-10-CM

## 2016-08-03 DIAGNOSIS — Z880 Allergy status to penicillin: Secondary | ICD-10-CM | POA: Insufficient documentation

## 2016-08-03 DIAGNOSIS — M79604 Pain in right leg: Secondary | ICD-10-CM

## 2016-08-03 DIAGNOSIS — S42009A Fracture of unspecified part of unspecified clavicle, initial encounter for closed fracture: Secondary | ICD-10-CM | POA: Insufficient documentation

## 2016-08-03 DIAGNOSIS — M5410 Radiculopathy, site unspecified: Secondary | ICD-10-CM

## 2016-08-03 DIAGNOSIS — G8929 Other chronic pain: Secondary | ICD-10-CM | POA: Diagnosis not present

## 2016-08-03 DIAGNOSIS — M545 Low back pain: Secondary | ICD-10-CM | POA: Diagnosis present

## 2016-08-03 DIAGNOSIS — M79605 Pain in left leg: Secondary | ICD-10-CM

## 2016-08-03 MED ORDER — SODIUM CHLORIDE 0.9 % IJ SOLN
INTRAMUSCULAR | Status: AC
  Filled 2016-08-03: qty 10

## 2016-08-03 MED ORDER — FENTANYL CITRATE (PF) 100 MCG/2ML IJ SOLN
INTRAMUSCULAR | Status: AC
  Filled 2016-08-03: qty 2

## 2016-08-03 MED ORDER — MIDAZOLAM HCL 5 MG/5ML IJ SOLN: 1.0000 mg | INTRAMUSCULAR | Status: DC | PRN

## 2016-08-03 MED ORDER — LIDOCAINE HCL (PF) 1 % IJ SOLN
INTRAMUSCULAR | Status: AC
  Filled 2016-08-03: qty 5

## 2016-08-03 MED ORDER — IOPAMIDOL (ISOVUE-M 200) INJECTION 41%
10.0000 mL | Freq: Once | INTRAMUSCULAR | Status: AC
  Administered 2016-08-03: 10 mL via EPIDURAL
  Filled 2016-08-03: qty 10

## 2016-08-03 MED ORDER — ROPIVACAINE HCL 2 MG/ML IJ SOLN
INTRAMUSCULAR | Status: AC
  Filled 2016-08-03: qty 10

## 2016-08-03 MED ORDER — SODIUM CHLORIDE 0.9% FLUSH
2.0000 mL | Freq: Once | INTRAVENOUS | Status: AC
  Administered 2016-08-03: 2 mL

## 2016-08-03 MED ORDER — MIDAZOLAM HCL 5 MG/5ML IJ SOLN
INTRAMUSCULAR | Status: AC
  Filled 2016-08-03: qty 5

## 2016-08-03 MED ORDER — LACTATED RINGERS IV SOLN
1000.0000 mL | Freq: Once | INTRAVENOUS | Status: AC
  Administered 2016-08-03: 1000 mL via INTRAVENOUS

## 2016-08-03 MED ORDER — TRIAMCINOLONE ACETONIDE 40 MG/ML IJ SUSP
INTRAMUSCULAR | Status: AC
  Filled 2016-08-03: qty 1

## 2016-08-03 MED ORDER — FENTANYL CITRATE (PF) 100 MCG/2ML IJ SOLN
25.0000 ug | INTRAMUSCULAR | Status: DC | PRN
  Administered 2016-08-03: 25 ug via INTRAVENOUS

## 2016-08-03 MED ORDER — TRIAMCINOLONE ACETONIDE 40 MG/ML IJ SUSP
40.0000 mg | Freq: Once | INTRAMUSCULAR | Status: AC
  Administered 2016-08-03: 40 mg

## 2016-08-03 MED ORDER — ROPIVACAINE HCL 2 MG/ML IJ SOLN
2.0000 mL | Freq: Once | INTRAMUSCULAR | Status: AC
Start: 2016-08-03 — End: 2016-08-03
  Administered 2016-08-03: 2 mL via EPIDURAL

## 2016-08-03 MED ORDER — LIDOCAINE HCL (PF) 1 % IJ SOLN
10.0000 mL | Freq: Once | INTRAMUSCULAR | Status: AC
  Administered 2016-08-03: 10 mL

## 2016-08-03 MED ORDER — LIDOCAINE HCL (PF) 1 % IJ SOLN
INTRAMUSCULAR | Status: AC
  Filled 2016-08-03: qty 10

## 2016-08-03 MED ORDER — IOPAMIDOL (ISOVUE-M 200) INJECTION 41%
INTRAMUSCULAR | Status: AC
Start: 2016-08-03 — End: 2016-08-03
  Filled 2016-08-03: qty 10

## 2016-08-03 NOTE — Progress Notes (Addendum)
Safety precautions to be maintained throughout the outpatient stay will include: orient to surroundings, keep bed in low position, maintain call bell within reach at all times, provide assistance with transfer out of bed and ambulation. Unable to obtain an accurate list of medications.  Patient is unsure of what she takes.  Attempted to call pharmacy, but they are not open yet.  Patient states she lives alone and manages her medications alone.  Dr Dossie Arbour notified.    Received pharmacy printout .  Per Dr Dossie Arbour removed medications that have not been filled since 03-28-16.l  Instructed patient to bring all of her medications to her next appointment for evalutation and to correct her med list.

## 2016-08-03 NOTE — Progress Notes (Signed)
Patient's Name: Kristi [name]  MRN: [id number]  Referring Provider: Lorelee Market, MD  DOB: [birth year]  PCP: Lorelee Market, MD  DOS: 08/03/2016  Note by: Kathlen Brunswick. Dossie Arbour, MD  Service setting: Ambulatory outpatient  Location: [facility] (AMB) Pain Management Facility  Visit type: Procedure  Specialty: Interventional Pain Management  Patient type: Established   Primary Reason for Visit: Interventional Pain Management Treatment. CC: Back Pain (low right)  Procedure:  Anesthesia, Analgesia, Anxiolysis:  Type: Palliative Inter-Laminar Epidural Steroid Injection Region: Lumbar Level: L3-4 Level. Laterality: Right-Sided Paramedial  Type: Local Anesthesia with Moderate (Conscious) Sedation Local Anesthetic: Lidocaine 1% Route: Intravenous (IV) IV Access: Secured Sedation: Meaningful verbal contact was maintained at all times during the procedure  Indication(s): Analgesia and Anxiety  Indications: 1. Chronic low back pain (Location of Secondary source of pain) (Bilateral) (R>L)   2. Chronic lower extremity pain (Location of Primary Source of Pain) (Bilateral) (R>L)   3. Chronic lower extremity radicular pain (Location of Primary Source of Pain) (Bilateral) (R>L)   4. 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 (year old), female patient, seen today for interventional treatment. She  has a past surgical history that includes Abdominal hysterectomy; Coronary stent placement; Coronary artery bypass graft; and s/p pacer insertion. Her primarily concern today is the Back Pain (low right)  Initial Vital Signs: Blood pressure 113/74, pulse 82, temperature 98.3 F (36.8 C), resp. rate 18, height 5\' 7"  (1.702 m), weight 135 lb (61.2 kg), SpO2 99 %. BMI: 21.14 kg/m  Risk Assessment: Allergies: Reviewed.  She is allergic to penicillin g.  Allergy Precautions: None required Coagulopathies: Reviewed. None identified.  Blood-thinner therapy: None at this time Active Infection(s): Reviewed. None identified. Kristi [name] is afebrile  Site Confirmation: Kristi [name] was asked to confirm the procedure and laterality before marking the site Procedure checklist: Completed Consent: Before the procedure and under the influence of no sedative(s), amnesic(s), or anxiolytics, the patient was informed of the treatment options, risks and possible complications. To fulfill our ethical and legal obligations, as recommended by the American Medical Association's Code of Ethics, I have informed the patient of my clinical impression; the nature and purpose of the treatment or procedure; the risks, benefits, and possible complications of the intervention; the alternatives, including doing nothing; the risk(s) and benefit(s) of the alternative treatment(s) or procedure(s); and the risk(s) and benefit(s) of doing nothing. The patient was provided information about the general risks and possible complications associated with the procedure. These may include, but are not limited to: failure to achieve desired goals, infection, bleeding, organ or nerve damage, allergic reactions, paralysis, and death. In addition, the patient was informed of those risks and complications associated to Spine-related procedures, such as failure to decrease pain; infection (i.e.: Meningitis, epidural or intraspinal abscess); bleeding (i.e.: epidural hematoma, subarachnoid hemorrhage, or any other type of intraspinal or peri-dural bleeding); organ or nerve damage (i.e.: Any type of peripheral nerve, nerve root, or spinal cord injury) with subsequent damage to sensory, motor, and/or autonomic systems, resulting in permanent pain, numbness, and/or weakness of one or several areas of the body; allergic reactions; (i.e.: anaphylactic reaction); and/or  death. Furthermore, the patient was informed of those risks and complications associated with the medications. These include, but are not limited to: allergic reactions (i.e.: anaphylactic or anaphylactoid reaction(s)); adrenal axis suppression;  blood sugar elevation that in diabetics may result in ketoacidosis or comma; water retention that in patients with history of congestive heart failure may result in shortness of breath, pulmonary edema, and decompensation with resultant heart failure; weight gain; swelling or edema; medication-induced neural toxicity; particulate matter embolism and blood vessel occlusion with resultant organ, and/or nervous system infarction; and/or aseptic necrosis of one or more joints. Finally, the patient was informed that Medicine is not an exact science; therefore, there is also the possibility of unforeseen or unpredictable risks and/or possible complications that may result in a catastrophic outcome. The patient indicated having understood very clearly. We have given the patient no guarantees and we have made no promises. Enough time was given to the patient to ask questions, all of which were answered to the patient's satisfaction. Kristi [name] has indicated that she wanted to continue with the procedure. Attestation: I, the ordering provider, attest that I have discussed with the patient the benefits, risks, side-effects, alternatives, likelihood of achieving goals, and potential problems during recovery for the procedure that I have provided informed consent. Date: 08/03/2016; Time: 9:58 AM  Pre-Procedure Preparation:  Monitoring: As per clinic protocol. Respiration, ETCO2, SpO2, BP, heart rate and rhythm monitor placed and checked for adequate function Safety Precautions: Patient was assessed for positional comfort and pressure points before starting the procedure. Time-out: I initiated and conducted the "Time-out" before starting the procedure, as per protocol. The  patient was asked to participate by confirming the accuracy of the "Time Out" information. Verification of the correct person, site, and procedure were performed and confirmed by me, the nursing staff, and the patient. "Time-out" conducted as per Joint Commission's Universal Protocol (UP.01.01.01). "Time-out" Date & Time: 08/03/2016; 1020 hrs.  Description of Procedure Process:   Position: Prone with head of the table was raised to facilitate breathing. Target Area: The interlaminar space, initially targeting the lower laminar border of the superior vertebral body. Approach: Paramedial approach. Area Prepped: Entire Posterior Lumbar Region Prepping solution: ChloraPrep (2% chlorhexidine gluconate and 70% isopropyl alcohol) Safety Precautions: Aspiration looking for blood return was conducted prior to all injections. At no point did we inject any substances, as a needle was being advanced. No attempts were made at seeking any paresthesias. Safe injection practices and needle disposal techniques used. Medications properly checked for expiration dates. SDV (single dose vial) medications used. Description of the Procedure: Protocol guidelines were followed. The procedure needle was introduced through the skin, ipsilateral to the reported pain, and advanced to the target area. Bone was contacted and the needle walked caudad, until the lamina was cleared. The epidural space was identified using "loss-of-resistance technique" with 2-3 ml of PF-NaCl (0.9% NSS), in a 5cc LOR glass syringe. Vitals:   08/03/16 1025 08/03/16 1029 08/03/16 1039 08/03/16 1049  BP: 122/78 130/77 129/66 129/69  Pulse:   63 60  Resp: 18 17 16 16   Temp:      SpO2: 96% 95% 98% 99%  Weight:      Height:        Start Time: 1020 hrs. End Time: 1027 hrs. Materials:  Needle(s) Type: Epidural needle Gauge: 17G Length: 3.5-in Medication(s): We administered lactated ringers, fentaNYL, iopamidol, triamcinolone acetonide, lidocaine  (PF), sodium chloride flush, and ropivacaine (PF) 2 mg/mL (0.2%). Please see chart orders for dosing details.  Imaging Guidance (Spinal):  Type of Imaging Technique: Fluoroscopy Guidance (Spinal) Indication(s): Assistance in needle guidance and placement for procedures requiring needle placement in or near specific anatomical locations not  easily accessible without such assistance. Exposure Time: Please see nurses notes. Contrast: Before injecting any contrast, we confirmed that the patient did not have an allergy to iodine, shellfish, or radiological contrast. Once satisfactory needle placement was completed at the desired level, radiological contrast was injected. Contrast injected under live fluoroscopy. No contrast complications. See chart for type and volume of contrast used. Fluoroscopic Guidance: I was personally present during the use of fluoroscopy. "Tunnel Vision Technique" used to obtain the best possible view of the target area. Parallax error corrected before commencing the procedure. "Direction-depth-direction" technique used to introduce the needle under continuous pulsed fluoroscopy. Once target was reached, antero-posterior, oblique, and lateral fluoroscopic projection used confirm needle placement in all planes. Images permanently stored in EMR. Interpretation: I personally interpreted the imaging intraoperatively. Adequate needle placement confirmed in multiple planes. Appropriate spread of contrast into desired area was observed. No evidence of afferent or efferent intravascular uptake. No intrathecal or subarachnoid spread observed. Permanent images saved into the patient's record.  Antibiotic Prophylaxis:  Indication(s): None identified Antibiotic given: None  Post-operative Assessment:  EBL: None Complications: No immediate post-treatment complications observed by team, or reported by patient. Note: The patient tolerated the entire procedure well. A repeat set of vitals were  taken after the procedure and the patient was kept under observation following institutional policy, for this type of procedure. Post-procedural neurological assessment was performed, showing return to baseline, prior to discharge. The patient was provided with post-procedure discharge instructions, including a section on how to identify potential problems. Should any problems arise concerning this procedure, the patient was given instructions to immediately contact us, at any time, without hesitation. In any case, we plan to contact the patient by telephone for a follow-up status report regarding this interventional procedure. Comments:  No additional relevant information.  Plan of Care  Disposition: Discharge home  Discharge Date & Time: 08/03/2016;   hrs.  Physician-requested Follow-up:  Return in about 2 weeks (around 08/17/2016) for post-procedure eval (in 2 wks), w/ MD.  Future Appointments Date Time Provider [place]  09/01/2016 11:00 AM Milinda Pointer, MD [facility] None  11/09/2016 9:00 AM Vevelyn Francois, NP [facility] None   Medications ordered for procedure: Meds ordered this encounter  Medications  . lactated ringers infusion 1,000 mL  . midazolam (VERSED) 5 MG/5ML injection 1-2 mg    Make sure Flumazenil is available in the pyxis when using this medication. If oversedation occurs, administer 0.2 mg IV over 15 sec. If after 45 sec no response, administer 0.2 mg again over 1 min; may repeat at 1 min intervals; not to exceed 4 doses (1 mg)  . fentaNYL (SUBLIMAZE) injection 25-50 mcg    Make sure Narcan is available in the pyxis when using this medication. In the event of respiratory depression (RR< 8/min): Titrate NARCAN (naloxone) in increments of 0.1 to 0.2 mg IV at 2-3 minute intervals, until desired degree of reversal.  . iopamidol (ISOVUE-M) 41 % intrathecal injection 10 mL  . triamcinolone acetonide (KENALOG-40) injection 40 mg  . lidocaine (PF) (XYLOCAINE) 1 %  injection 10 mL  . sodium chloride flush (NS) 0.9 % injection 2 mL  . ropivacaine (PF) 2 mg/mL (0.2%) (NAROPIN) injection 2 mL   Medications administered: We administered lactated ringers, fentaNYL, iopamidol, triamcinolone acetonide, lidocaine (PF), sodium chloride flush, and ropivacaine (PF) 2 mg/mL (0.2%).  See the medical record for exact dosing, route, and time of administration.  Lab-work, Procedure(s), & Referral(s) Ordered: Orders Placed This Encounter  Procedures  . Lumbar Epidural Injection  . DG C-Arm 1-60 Min-No Report  . Informed Consent Details: Transcribe to consent form and obtain patient signature  . Provider attestation of informed consent for procedure/surgical case  . Verify informed consent  . Discharge instructions  . Follow-up   Imaging Ordered: New Prescriptions   No medications on file   Primary Care Physician: Lorelee Market, MD Location: [facility] Note by: Kathlen Brunswick. Dossie Arbour, M.D, DABA, DABAPM, DABPM, DABIPP, FIPP Date: 08/03/2016; Time: 10:58 AM  Disclaimer:  Medicine is not an exact science. The only guarantee in medicine is that nothing is guaranteed. It is important to note that the decision to proceed with this intervention was based on the information collected from the patient. The Data and conclusions were drawn from the patient's questionnaire, the interview, and the physical examination. Because the information was provided in large part by the patient, it cannot be guaranteed that it has not been purposely or unconsciously manipulated. Every effort has been made to obtain as much relevant data as possible for this evaluation. It is important to note that the conclusions that lead to this procedure are derived in large part from the available data. Always take into account that the treatment will also be dependent on availability of resources and existing treatment guidelines, considered by other Pain Management  Practitioners as being common knowledge and practice, at the time of the intervention. For Medico-Legal purposes, it is also important to point out that variation in procedural techniques and pharmacological choices are the acceptable norm. The indications, contraindications, technique, and results of the above procedure should only be interpreted and judged by a Board-Certified Interventional Pain Specialist with extensive familiarity and expertise in the same exact procedure and technique.  Instructions provided at this appointment: Patient Instructions  Post-Procedure instructions Instructions:  Apply ice: Fill a plastic sandwich bag with crushed ice. Cover it with a small towel and apply to injection site. Apply for 15 minutes then remove x 15 minutes. Repeat sequence on day of procedure, until you go to bed. The purpose is to minimize swelling and discomfort after procedure.  Apply heat: Apply heat to procedure site starting the day following the procedure. The purpose is to treat any soreness and discomfort from the procedure.  Food intake: Start with clear liquids (like water) and advance to regular food, as tolerated.   Physical activities: Keep activities to a minimum for the first 8 hours after the procedure.   Driving: If you have received any sedation, you are not allowed to drive for 24 hours after your procedure.  Blood thinner: Restart your blood thinner 6 hours after your procedure. (Only for those taking blood thinners)  Insulin: As soon as you can eat, you may resume your normal dosing schedule. (Only for those taking insulin)  Infection prevention: Keep procedure site clean and dry.  Post-procedure Pain Diary: Extremely important that this be done correctly and accurately. Recorded information will be used to determine the next step in treatment.  Pain evaluated is that of treated area only. Do not include pain from an untreated area.  Complete every hour, on the hour,  for the initial 8 hours. Set an alarm to help you do this part accurately.  Do not go to sleep and have it completed later. It will not be accurate.  Follow-up appointment: Keep your follow-up appointment after the procedure. Usually 2 weeks for most procedures. (6 weeks in the case of radiofrequency.) Bring you  pain diary.  Expect:  From numbing medicine (AKA: Local Anesthetics): Numbness or decrease in pain.  Onset: Full effect within 15 minutes of injected.  Duration: It will depend on the type of local anesthetic used. On the average, 1 to 8 hours.   From steroids: Decrease in swelling or inflammation. Once inflammation is improved, relief of the pain will follow.  Onset of benefits: Depends on the amount of swelling present. The more swelling, the longer it will take for the benefits to be seen.   Duration: Steroids will stay in the system x 2 weeks. Duration of benefits will depend on multiple posibilities including persistent irritating factors.  From procedure: Some discomfort is to be expected once the numbing medicine wears off. This should be minimal if ice and heat are applied as instructed. Call if:  You experience numbness and weakness that gets worse with time, as opposed to wearing off.  New onset bowel or bladder incontinence. (Spinal procedures only)  Emergency Numbers:  [facility] business hours (Monday - Thursday, 8:00 AM - 4:00 PM) (Friday, 9:00 AM - 12:00 Noon): [phone]  After hours: [phone] _____________________________________________________________________________________________

## 2016-08-03 NOTE — Patient Instructions (Signed)
Post-Procedure instructions Instructions:  Apply ice: Fill a plastic sandwich bag with crushed ice. Cover it with a small towel and apply to injection site. Apply for 15 minutes then remove x 15 minutes. Repeat sequence on day of procedure, until you go to bed. The purpose is to minimize swelling and discomfort after procedure.  Apply heat: Apply heat to procedure site starting the day following the procedure. The purpose is to treat any soreness and discomfort from the procedure.  Food intake: Start with clear liquids (like water) and advance to regular food, as tolerated.   Physical activities: Keep activities to a minimum for the first 8 hours after the procedure.   Driving: If you have received any sedation, you are not allowed to drive for 24 hours after your procedure.  Blood thinner: Restart your blood thinner 6 hours after your procedure. (Only for those taking blood thinners)  Insulin: As soon as you can eat, you may resume your normal dosing schedule. (Only for those taking insulin)  Infection prevention: Keep procedure site clean and dry.  Post-procedure Pain Diary: Extremely important that this be done correctly and accurately. Recorded information will be used to determine the next step in treatment.  Pain evaluated is that of treated area only. Do not include pain from an untreated area.  Complete every hour, on the hour, for the initial 8 hours. Set an alarm to help you do this part accurately.  Do not go to sleep and have it completed later. It will not be accurate.  Follow-up appointment: Keep your follow-up appointment after the procedure. Usually 2 weeks for most procedures. (6 weeks in the case of radiofrequency.) Bring you pain diary.  Expect:  From numbing medicine (AKA: Local Anesthetics): Numbness or decrease in pain.  Onset: Full effect within 15 minutes of injected.  Duration: It will depend on the type of local anesthetic used. On the average, 1 to 8  hours.   From steroids: Decrease in swelling or inflammation. Once inflammation is improved, relief of the pain will follow.  Onset of benefits: Depends on the amount of swelling present. The more swelling, the longer it will take for the benefits to be seen.   Duration: Steroids will stay in the system x 2 weeks. Duration of benefits will depend on multiple posibilities including persistent irritating factors.  From procedure: Some discomfort is to be expected once the numbing medicine wears off. This should be minimal if ice and heat are applied as instructed. Call if:  You experience numbness and weakness that gets worse with time, as opposed to wearing off.  New onset bowel or bladder incontinence. (Spinal procedures only)  Emergency Numbers:  [place] business hours (Monday - Thursday, 8:00 AM - 4:00 PM) (Friday, 9:00 AM - 12:00 Noon): [phone]  After hours: [phone]   __________________________________________________________________________________________    

## 2016-08-04 ENCOUNTER — Telehealth: Payer: Self-pay | Admitting: *Deleted

## 2016-08-04 NOTE — Telephone Encounter (Signed)
Spoke with patient re; procedure on yesterday, verbalizes no c/o.

## 2016-08-28 ENCOUNTER — Encounter: Payer: Self-pay | Admitting: Emergency Medicine

## 2016-08-28 ENCOUNTER — Emergency Department
Admission: EM | Admit: 2016-08-28 | Discharge: 2016-08-28 | Disposition: A | Discharge status: Home / Self Care | Payer: Medicare Other | Attending: Emergency Medicine | Admitting: Emergency Medicine

## 2016-08-28 ENCOUNTER — Emergency Department: Payer: Medicare Other

## 2016-08-28 DIAGNOSIS — Z79899 Other long term (current) drug therapy: Secondary | ICD-10-CM | POA: Diagnosis not present

## 2016-08-28 DIAGNOSIS — F1190 Opioid use, unspecified, uncomplicated: Secondary | ICD-10-CM | POA: Diagnosis not present

## 2016-08-28 DIAGNOSIS — E039 Hypothyroidism, unspecified: Secondary | ICD-10-CM | POA: Diagnosis not present

## 2016-08-28 DIAGNOSIS — Z7982 Long term (current) use of aspirin: Secondary | ICD-10-CM | POA: Insufficient documentation

## 2016-08-28 DIAGNOSIS — I252 Old myocardial infarction: Secondary | ICD-10-CM | POA: Insufficient documentation

## 2016-08-28 DIAGNOSIS — I10 Essential (primary) hypertension: Secondary | ICD-10-CM | POA: Insufficient documentation

## 2016-08-28 DIAGNOSIS — Z951 Presence of aortocoronary bypass graft: Secondary | ICD-10-CM | POA: Insufficient documentation

## 2016-08-28 DIAGNOSIS — R0602 Shortness of breath: Secondary | ICD-10-CM | POA: Insufficient documentation

## 2016-08-28 DIAGNOSIS — R2243 Localized swelling, mass and lump, lower limb, bilateral: Secondary | ICD-10-CM | POA: Insufficient documentation

## 2016-08-28 DIAGNOSIS — R339 Retention of urine, unspecified: Secondary | ICD-10-CM

## 2016-08-28 DIAGNOSIS — I2510 Atherosclerotic heart disease of native coronary artery without angina pectoris: Secondary | ICD-10-CM | POA: Insufficient documentation

## 2016-08-28 DIAGNOSIS — M7989 Other specified soft tissue disorders: Secondary | ICD-10-CM

## 2016-08-28 LAB — URINALYSIS, COMPLETE (UACMP) WITH MICROSCOPIC
Bacteria, UA: NONE SEEN
Bilirubin Urine: NEGATIVE
Glucose, UA: NEGATIVE mg/dL
Ketones, ur: NEGATIVE mg/dL
Leukocytes, UA: NEGATIVE
Nitrite: NEGATIVE
Protein, ur: NEGATIVE mg/dL
Specific Gravity, Urine: 1.003 — ABNORMAL LOW (ref 1.005–1.030)
Squamous Epithelial / LPF: NONE SEEN
pH: 6 (ref 5.0–8.0)

## 2016-08-28 LAB — BASIC METABOLIC PANEL
Anion gap: 8 (ref 5–15)
BUN: 16 mg/dL (ref 6–20)
CO2: 32 mmol/L (ref 22–32)
Calcium: 9.3 mg/dL (ref 8.9–10.3)
Chloride: 97 mmol/L — ABNORMAL LOW (ref 101–111)
Creatinine, Ser: 1.18 mg/dL — ABNORMAL HIGH (ref 0.44–1.00)
GFR calc Af Amer: 50 mL/min — ABNORMAL LOW (ref >60)
GFR calc non Af Amer: 43 mL/min — ABNORMAL LOW (ref >60)
Glucose, Bld: 103 mg/dL — ABNORMAL HIGH (ref 65–99)
Potassium: 3.4 mmol/L — ABNORMAL LOW (ref 3.5–5.1)
Sodium: 137 mmol/L (ref 135–145)

## 2016-08-28 LAB — TROPONIN I: Troponin I: <0.03 ng/mL (ref <0.03)

## 2016-08-28 LAB — BRAIN NATRIURETIC PEPTIDE: B Natriuretic Peptide: 111 pg/mL — ABNORMAL HIGH (ref 0.0–100.0)

## 2016-08-28 LAB — CBC
HCT: 37.6 % (ref 35.0–47.0)
Hemoglobin: 12.6 g/dL (ref 12.0–16.0)
MCH: 31.5 pg (ref 26.0–34.0)
MCHC: 33.6 g/dL (ref 32.0–36.0)
MCV: 93.6 fL (ref 80.0–100.0)
Platelets: 158 10*3/uL (ref 150–440)
RBC: 4.01 MIL/uL (ref 3.80–5.20)
RDW: 14.1 % (ref 11.5–14.5)
WBC: 5.2 10*3/uL (ref 3.6–11.0)

## 2016-08-28 NOTE — ED Triage Notes (Addendum)
Pt c/o SHOB for about a week along with BLE swelling.  SHOB is worse when laying flat.  Has had triple bypass per pt.  Respirations unlabored at this time.  Mild edema noted legs, right worse than left.  Pt also reports has not urianted since 7 am.  Bladder scan in triage; 643 ml

## 2016-08-28 NOTE — Discharge Instructions (Signed)
Please seek medical attention for any high fevers, chest pain, shortness of breath, change in behavior, persistent vomiting, bloody stool or any other new or concerning symptoms.  
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
## 2016-09-01 ENCOUNTER — Ambulatory Visit: Payer: Medicare Other | Admitting: Pain Medicine

## 2016-09-08 ENCOUNTER — Ambulatory Visit: Payer: Medicare Other | Attending: Pain Medicine | Admitting: Pain Medicine

## 2016-09-08 ENCOUNTER — Encounter: Payer: Self-pay | Admitting: Pain Medicine

## 2016-09-08 VITALS — BP 125/74 | HR 67 | Temp 98.3°F | Resp 16 | Ht 66.0 in | Wt 140.0 lb

## 2016-09-08 DIAGNOSIS — M79605 Pain in left leg: Secondary | ICD-10-CM | POA: Diagnosis not present

## 2016-09-08 DIAGNOSIS — Z79899 Other long term (current) drug therapy: Secondary | ICD-10-CM | POA: Diagnosis not present

## 2016-09-08 DIAGNOSIS — Z880 Allergy status to penicillin: Secondary | ICD-10-CM | POA: Diagnosis not present

## 2016-09-08 DIAGNOSIS — Z950 Presence of cardiac pacemaker: Secondary | ICD-10-CM | POA: Insufficient documentation

## 2016-09-08 DIAGNOSIS — M48061 Spinal stenosis, lumbar region without neurogenic claudication: Secondary | ICD-10-CM | POA: Insufficient documentation

## 2016-09-08 DIAGNOSIS — M542 Cervicalgia: Secondary | ICD-10-CM | POA: Diagnosis not present

## 2016-09-08 DIAGNOSIS — M5441 Lumbago with sciatica, right side: Secondary | ICD-10-CM

## 2016-09-08 DIAGNOSIS — F419 Anxiety disorder, unspecified: Secondary | ICD-10-CM | POA: Insufficient documentation

## 2016-09-08 DIAGNOSIS — Z951 Presence of aortocoronary bypass graft: Secondary | ICD-10-CM | POA: Insufficient documentation

## 2016-09-08 DIAGNOSIS — M4696 Unspecified inflammatory spondylopathy, lumbar region: Secondary | ICD-10-CM

## 2016-09-08 DIAGNOSIS — M79604 Pain in right leg: Secondary | ICD-10-CM | POA: Insufficient documentation

## 2016-09-08 DIAGNOSIS — M5442 Lumbago with sciatica, left side: Secondary | ICD-10-CM

## 2016-09-08 DIAGNOSIS — K5903 Drug induced constipation: Secondary | ICD-10-CM | POA: Insufficient documentation

## 2016-09-08 DIAGNOSIS — T402X5A Adverse effect of other opioids, initial encounter: Secondary | ICD-10-CM | POA: Insufficient documentation

## 2016-09-08 DIAGNOSIS — I10 Essential (primary) hypertension: Secondary | ICD-10-CM | POA: Insufficient documentation

## 2016-09-08 DIAGNOSIS — M545 Low back pain: Secondary | ICD-10-CM | POA: Diagnosis present

## 2016-09-08 DIAGNOSIS — M4726 Other spondylosis with radiculopathy, lumbar region: Secondary | ICD-10-CM | POA: Insufficient documentation

## 2016-09-08 DIAGNOSIS — M5410 Radiculopathy, site unspecified: Secondary | ICD-10-CM

## 2016-09-08 DIAGNOSIS — G894 Chronic pain syndrome: Secondary | ICD-10-CM | POA: Insufficient documentation

## 2016-09-08 DIAGNOSIS — Z818 Family history of other mental and behavioral disorders: Secondary | ICD-10-CM | POA: Diagnosis not present

## 2016-09-08 DIAGNOSIS — M48062 Spinal stenosis, lumbar region with neurogenic claudication: Secondary | ICD-10-CM | POA: Diagnosis not present

## 2016-09-08 DIAGNOSIS — R296 Repeated falls: Secondary | ICD-10-CM | POA: Insufficient documentation

## 2016-09-08 DIAGNOSIS — F329 Major depressive disorder, single episode, unspecified: Secondary | ICD-10-CM | POA: Insufficient documentation

## 2016-09-08 DIAGNOSIS — R937 Abnormal findings on diagnostic imaging of other parts of musculoskeletal system: Secondary | ICD-10-CM | POA: Diagnosis not present

## 2016-09-08 DIAGNOSIS — Z79891 Long term (current) use of opiate analgesic: Secondary | ICD-10-CM | POA: Diagnosis not present

## 2016-09-08 DIAGNOSIS — Z8249 Family history of ischemic heart disease and other diseases of the circulatory system: Secondary | ICD-10-CM | POA: Insufficient documentation

## 2016-09-08 DIAGNOSIS — E039 Hypothyroidism, unspecified: Secondary | ICD-10-CM | POA: Diagnosis not present

## 2016-09-08 DIAGNOSIS — Z955 Presence of coronary angioplasty implant and graft: Secondary | ICD-10-CM | POA: Diagnosis not present

## 2016-09-08 DIAGNOSIS — M488X6 Other specified spondylopathies, lumbar region: Secondary | ICD-10-CM | POA: Diagnosis not present

## 2016-09-08 DIAGNOSIS — M9983 Other biomechanical lesions of lumbar region: Secondary | ICD-10-CM

## 2016-09-08 DIAGNOSIS — Z833 Family history of diabetes mellitus: Secondary | ICD-10-CM | POA: Diagnosis not present

## 2016-09-08 DIAGNOSIS — M47816 Spondylosis without myelopathy or radiculopathy, lumbar region: Secondary | ICD-10-CM

## 2016-09-08 DIAGNOSIS — Z7951 Long term (current) use of inhaled steroids: Secondary | ICD-10-CM | POA: Insufficient documentation

## 2016-09-08 DIAGNOSIS — K219 Gastro-esophageal reflux disease without esophagitis: Secondary | ICD-10-CM | POA: Insufficient documentation

## 2016-09-08 DIAGNOSIS — I2510 Atherosclerotic heart disease of native coronary artery without angina pectoris: Secondary | ICD-10-CM | POA: Insufficient documentation

## 2016-09-08 DIAGNOSIS — E785 Hyperlipidemia, unspecified: Secondary | ICD-10-CM | POA: Diagnosis not present

## 2016-09-08 DIAGNOSIS — I252 Old myocardial infarction: Secondary | ICD-10-CM | POA: Diagnosis not present

## 2016-09-08 DIAGNOSIS — R413 Other amnesia: Secondary | ICD-10-CM | POA: Diagnosis not present

## 2016-09-08 DIAGNOSIS — G8929 Other chronic pain: Secondary | ICD-10-CM

## 2016-09-08 DIAGNOSIS — G629 Polyneuropathy, unspecified: Secondary | ICD-10-CM | POA: Insufficient documentation

## 2016-09-08 DIAGNOSIS — M25532 Pain in left wrist: Secondary | ICD-10-CM | POA: Insufficient documentation

## 2016-09-08 DIAGNOSIS — F1190 Opioid use, unspecified, uncomplicated: Secondary | ICD-10-CM

## 2016-09-08 NOTE — Progress Notes (Signed)
Patient's Name: Kristi [name]  MRN: [id number]  Referring Provider: Lorelee Market, MD  DOB: [birth year]  PCP: Lorelee Market, MD  DOS: 09/08/2016  Note by: Kathlen Brunswick. Dossie Arbour, MD  Service setting: Ambulatory outpatient  Specialty: Interventional Pain Management  Location: [facility] (AMB) Pain Management Facility    Patient type: Established   Primary Reason(s) for Visit: Encounter for post-procedure evaluation of chronic illness with mild to moderate exacerbation CC: Back Pain (lower) and Leg Pain (both legs, back and front)  HPI  Kristi [name] is a 76 y.o. year old, female patient, who comes today for a post-procedure evaluation. 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, L3-4, L4-5); Sacroiliac joint dysfunction (Bilateral); Lumbar facet syndrome (Bilateral) (R>L); Lumbar radiculopathy; Anxiety and depression; CAD (coronary artery disease); Therapeutic opioid-induced constipation (OIC); Frequent falls; GERD (gastroesophageal reflux disease); Memory loss; Chronic pain syndrome; Long term current use of opiate analgesic; Long term prescription opiate use; Opiate use; Long term prescription benzodiazepine use; Neurogenic pain; Chronic lower extremity pain (Location of Primary Source of Pain) (Bilateral) (R>L); Chronic lower extremity radicular pain (Location of Primary Source of Pain) (Bilateral) (R>L); Lower extremity weakness; Chronic low back pain (Location of Secondary source of pain) (Bilateral) (R>L); Chronic wrist pain (Left) (secondary to fracture); Lumbar spondylosis; Fracture of clavicle; Polyneuropathy; Abnormal CT scan, lumbar spine; and Lumbar foraminal stenosis (multilevel) on her problem list. Her primarily concern today is the Back Pain (lower) and Leg Pain (both legs, back and front)  Pain Assessment: Self-Reported Pain Score: 6 /10 Clinically the patient looks like a 4/10 Reported level is inconsistent with clinical observations. Information on the proper use of the pain  scale provided to the patient today Pain Type: Chronic pain Pain Location: Back Pain Orientation: Lower Pain Descriptors / Indicators: Aching, Constant, Cramping, Radiating, Sharp (sharp pain in the heels, thinks the pain goes down) Pain Frequency: Constant  Kristi [name] comes in today for post-procedure evaluation after the treatment done on 08/03/2016.  Further details on both, my assessment(s), as well as the proposed treatment plan, please see below.  Post-Procedure Assessment  08/03/2016 Procedure: Diagnostic right-sided L3-4 interlaminar lumbar epidural steroid injection under fluoroscopic guidance and IV sedation Pre-procedure pain score:  8/10 Post-procedure pain score: 0/10 (100% relief) Influential Factors: BMI: 22.60 kg/m Intra-procedural challenges: None observed Assessment challenges: None detected         Post-procedural adverse reactions or complications: None reported Reported side-effects: None  Sedation: Sedation provided. When no sedatives are used, the analgesic levels obtained are directly associated to the effectiveness of the local anesthetics. However, when sedation is provided, the level of analgesia obtained during the initial 1 hour following the intervention, is believed to be the result of a combination of factors. These factors may include, but are not limited to: 1. The effectiveness of the local anesthetics used. 2. The effects of the analgesic(s) and/or anxiolytic(s) used. 3. The degree of discomfort experienced by the patient at the time of the procedure. 4. The patients ability and reliability in recalling and recording the events. 5. The presence and influence of possible secondary gains and/or psychosocial factors. Reported result: Relief experienced during the 1st hour after the procedure: 100 % (Ultra-Short Term Relief) Interpretative annotation: Analgesia during this period is likely to be Local Anesthetic and/or IV Sedative (Analgesic/Anxiolitic)  related.          Effects of local anesthetic: The analgesic effects attained during this period are directly associated to the localized infiltration of local anesthetics and therefore cary  significant diagnostic value as to the etiological location, or anatomical origin, of the pain. Expected duration of relief is directly dependent on the pharmacodynamics of the local anesthetic used. Long-acting (4-6 hours) anesthetics used.  Reported result: Relief during the next 4 to 6 hour after the procedure: 30 % (Short-Term Relief) Interpretative annotation: Complete relief would suggest area to be the source of the pain.          Long-term benefit: Defined as the period of time past the expected duration of local anesthetics. With the possible exception of prolonged sympathetic blockade from the local anesthetics, benefits during this period are typically attributed to, or associated with, other factors such as analgesic sensory neuropraxia, antiinflammatory effects, or beneficial biochemical changes provided by agents other than the local anesthetics Reported result: Extended relief following procedure: 20 % (pain came back after first week) (Long-Term Relief) Interpretative annotation: Good relief. This could suggest inflammation to be a significant component in the etiology to the pain.          Current benefits: Defined as persistent relief that continues at this point in time.   Reported results: Treated area: <25 %       Interpretative annotation: Recurrance of symptoms. This would suggest persistent aggravating factors  Interpretation: Results would suggest a successful diagnostic intervention.          Laboratory Chemistry  Inflammation Markers Lab Results  Component Value Date   CRP <0.8 04/12/2016   ESRSEDRATE 6 04/12/2016   (CRP: Acute Phase) (ESR: Chronic Phase) Renal Function Markers Lab Results  Component Value Date   BUN 16 08/28/2016   CREATININE 1.18 (H) 08/28/2016   GFRAA 50  (L) 08/28/2016   GFRNONAA 43 (L) 08/28/2016   Hepatic Function Markers Lab Results  Component Value Date   AST 34 04/12/2016   ALT 26 04/12/2016   ALBUMIN 4.6 04/12/2016   ALKPHOS 64 04/12/2016   Electrolytes Lab Results  Component Value Date   NA 137 08/28/2016   K 3.4 (L) 08/28/2016   CL 97 (L) 08/28/2016   CALCIUM 9.3 08/28/2016   MG 2.1 04/12/2016   Neuropathy Markers Lab Results  Component Value Date   VITAMINB12 1,406 (H) 04/12/2016   Bone Pathology Markers Lab Results  Component Value Date   ALKPHOS 64 04/12/2016   25OHVITD1 49 04/12/2016   25OHVITD2 <1.0 04/12/2016   25OHVITD3 49 04/12/2016   CALCIUM 9.3 08/28/2016   Coagulation Parameters Lab Results  Component Value Date   PLT 158 08/28/2016   Cardiovascular Markers Lab Results  Component Value Date   BNP 111.0 (H) 08/28/2016   HGB 12.6 08/28/2016   HCT 37.6 08/28/2016   Note: Lab results reviewed.  Recent Diagnostic Imaging Review  Dg Chest 2 View  Result Date: 08/28/2016 CLINICAL DATA:  Shortness of breath with bilateral lower extremity swelling EXAM: CHEST  2 VIEW COMPARISON:  Chest radiograph 05/28/2013 FINDINGS: Scattered metallic fragments overlie the left chest. There is a left chest wall pacemaker with leads in expected location. The lungs are well inflated without focal consolidation. No pleural effusion or pneumothorax. There is chronic interstitial prominence. No pulmonary edema. 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 Cardiomediastinal contours are normal. IMPRESSION: No active cardiopulmonary disease. Electronically Signed   By: Ulyses Jarred M.D.   On: 08/28/2016 16:32   Note: Imaging results reviewed.          Meds  The patient has a current medication list which includes the following prescription(s): azithromycin, cholecalciferol,  docusate sodium, fluticasone, furosemide, gabapentin, lamotrigine, levothyroxine, linaclotide, loratadine, multiple  vitamins-minerals, omeprazole, potassium chloride sa, rosuvastatin, sertraline, thiothixene, trazodone, and vitamin b-12.  Current Outpatient Prescriptions on File Prior to Visit  Medication Sig  . cholecalciferol (VITAMIN D) 1000 UNITS tablet Take 1,000 Units by mouth daily. Reported on 07/08/2015  . docusate sodium (COLACE) 100 MG capsule Take 100 mg by mouth 2 (two) times daily.  . fluticasone (FLONASE) 50 MCG/ACT nasal spray fluticasone 50 mcg/actuation nasal spray,suspension  . furosemide (LASIX) 40 MG tablet furosemide 40 mg tablet  . lamoTRIgine (LAMICTAL) 100 MG tablet lamotrigine 100 mg tablet  . levothyroxine (SYNTHROID, LEVOTHROID) 100 MCG tablet Take 100 mcg by mouth daily before breakfast.  . Linaclotide (LINZESS) 145 MCG CAPS capsule Take 145 mcg by mouth daily.  [name] loratadine (CLARITIN) 10 MG tablet Take 10 mg by mouth daily.  [name] omeprazole (PRILOSEC) 20 MG capsule Take 20 mg by mouth daily.  . rosuvastatin (CRESTOR) 20 MG tablet Take 20 mg by mouth daily.  . sertraline (ZOLOFT) 100 MG tablet Take 100 mg by mouth daily. i tab in the am and 0.5 tab at bedtime.  . traZODone (DESYREL) 50 MG tablet Take 50 mg by mouth at bedtime.   No current facility-administered medications on file prior to visit.    ROS  Constitutional: Denies any fever or chills Gastrointestinal: No reported hemesis, hematochezia, vomiting, or acute GI distress Musculoskeletal: Denies any acute onset joint swelling, redness, loss of ROM, or weakness Neurological: No reported episodes of acute onset apraxia, aphasia, dysarthria, agnosia, amnesia, paralysis, loss of coordination, or loss of consciousness  Allergies  Ms. Soledad is allergic to penicillin g.  PFSH  Drug: Ms. Rudnick  reports that she does not use drugs. Alcohol:  reports that she does not drink alcohol. Tobacco:  reports that she has never smoked. She has never used smokeless tobacco. Medical:  has a past medical history of Anxiety; Broken  arm; Broken arm; Depression; Hyperlipidemia; Hypertension; Hypothyroid; Myocardial infarction ([place]); Neck pain (06/04/2014); and Spinal stenosis. Family: family history includes Cancer in her mother; Depression in her mother; Diabetes in her mother; Heart disease in her father; Hypertension in her mother.  Past Surgical History:  Procedure Laterality Date  . ABDOMINAL HYSTERECTOMY    . CORONARY ARTERY BYPASS GRAFT    . CORONARY STENT PLACEMENT    . s/p pacer insertion     Constitutional Exam  General appearance: Well nourished, well developed, and well hydrated. In no apparent acute distress Vitals:   09/08/16 1406  BP: 125/74  Pulse: 67  Resp: 16  Temp: 98.3 F (36.8 C)  SpO2: 100%  Weight: 140 lb (63.5 kg)  Height: _0  (1.676 m)   BMI Assessment: Estimated body mass index is 22.6 kg/m as calculated from the following:   Height as of this encounter: _1  (1.676 m).   Weight as of this encounter: 140 lb (63.5 kg).  BMI interpretation table: BMI level Category Range association with higher incidence of chronic pain  <18 kg/m2 Underweight   18.5-24.9 kg/m2 Ideal body weight   25-29.9 kg/m2 Overweight Increased incidence by 20%  30-34.9 kg/m2 Obese (Class I) Increased incidence by 68%  35-39.9 kg/m2 Severe obesity (Class II) Increased incidence by 136%  >40 kg/m2 Extreme obesity (Class III) Increased incidence by 254%   BMI Readings from Last 4 Encounters:  09/08/16 22.60 kg/m  08/28/16 21.93 kg/m  08/03/16 21.14 kg/m  05/10/16 21.79 kg/m   Wt Readings from Last 4  Encounters:  09/08/16 140 lb (63.5 kg)  08/28/16 140 lb (63.5 kg)  08/03/16 135 lb (61.2 kg)  05/10/16 135 lb (61.2 kg)  Psych/Mental status: Alert, oriented x 3 (person, place, & time)       Eyes: PERLA Respiratory: No evidence of acute respiratory distress  Cervical Spine Exam  Inspection: No masses, redness, or swelling Alignment: Symmetrical Functional ROM: Unrestricted ROM      Stability: No  instability detected Muscle strength & Tone: Functionally intact Sensory: Unimpaired Palpation: No palpable anomalies              Upper Extremity (UE) Exam    Side: Right upper extremity  Side: Left upper extremity  Inspection: No masses, redness, swelling, or asymmetry. No contractures  Inspection: No masses, redness, swelling, or asymmetry. No contractures  Functional ROM: Unrestricted ROM          Functional ROM: Unrestricted ROM          Muscle strength & Tone: Functionally intact  Muscle strength & Tone: Functionally intact  Sensory: Unimpaired  Sensory: Unimpaired  Palpation: No palpable anomalies              Palpation: No palpable anomalies              Specialized Test(s): Deferred         Specialized Test(s): Deferred          Thoracic Spine Exam  Inspection: No masses, redness, or swelling Alignment: Symmetrical Functional ROM: Unrestricted ROM Stability: No instability detected Sensory: Unimpaired Muscle strength & Tone: No palpable anomalies  Lumbar Spine Exam  Inspection: No masses, redness, or swelling Alignment: Symmetrical Functional ROM: Minimal ROM      Stability: No instability detected Muscle strength & Tone: Functionally intact Sensory: Movement-associated pain Palpation: Tender       Provocative Tests: Lumbar Hyperextension and rotation test: evaluation deferred today       Patrick's Maneuver: evaluation deferred today                    Gait & Posture Assessment  Ambulation: Limited Gait: Age-related, senile gait pattern Posture: Antalgic   Lower Extremity Exam    Side: Right lower extremity  Side: Left lower extremity  Inspection: No masses, redness, swelling, or asymmetry. No contractures  Inspection: No masses, redness, swelling, or asymmetry. No contractures  Functional ROM: Unrestricted ROM          Functional ROM: Unrestricted ROM          Muscle strength & Tone: Functionally intact  Muscle strength & Tone: Functionally intact  Sensory:  Unimpaired  Sensory: Unimpaired  Palpation: No palpable anomalies  Palpation: No palpable anomalies   Assessment  Primary Diagnosis & Pertinent Problem List: The primary encounter diagnosis was Chronic lower extremity pain (Location of Primary Source of Pain) (Bilateral) (R>L). 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  Status Diagnosis  Controlled Controlled Controlled 1. Chronic lower extremity pain (Location of Primary Source of Pain) (Bilateral) (R>L)   2. Chronic low back pain (Location of Secondary source of pain) (Bilateral) (R>L)   3.  Chronic lower extremity radicular pain (Location of Primary Source of Pain) (Bilateral) (R>L)   4. 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 Lumbar foraminal stenosis   6. Lumbar facet syndrome (Bilateral) (R>L)   7. Abnormal CT scan, lumbar spine   8. Chronic pain syndrome   9. Long term current use of opiate analgesic   10. Long term prescription opiate use   11. Opiate use     Problems updated and reviewed during this visit: No problems updated. Plan of Care  Pharmacotherapy (Medications Ordered): No orders of the defined types were placed in this encounter.  New Prescriptions   No medications on file   Medications administered today: Ms. Swader had no medications administered during this visit. Lab-work, procedure(s), and/or referral(s): Orders Placed This Encounter  Procedures  . Lumbar Epidural Injection  . Lumbar Transforaminal Epidural  . Ambulatory referral to  Neurosurgery   Imaging and/or referral(s): AMB REFERRAL TO NEUROSURGERY  Interventional therapies: Planned, scheduled, and/or pending:   1. Schedule patient to return for a right-sided L2-3 and a bilateral L3-4 transforaminal epidural steroid injection + a midline L4-5 interlaminar epidural steroid injection under fluoroscopic guidance and IV sedation. 2. Neurosurgery consult for possible lumbar decompression.    Considering:   Diagnostic lumbar epidural steroid injection under fluoroscopic guidance  Diagnostic bilateral transforaminal epidural steroid injections  Diagnostic bilateral lumbar facet blocks  Possible bilateral lumbar facet RFA  Diagnostic bilateral SI joint blocks  Possible bilateral SI joint RFA    Palliative PRN treatment(s):   Diagnostic lumbar epidural steroid injection    Provider-requested follow-up: Return for procedure (w/ sedation):, (ASAP), by MD.  Future Appointments Date Time Provider [place]  09/28/2016 9:45 AM Milinda Pointer, MD [facility] None  11/09/2016 9:00 AM Vevelyn Francois, NP [facility] None   Primary Care Physician: Lorelee Market, MD Location: [facility] Note by: Kathlen Brunswick. Dossie Arbour, M.D, DABA, DABAPM, DABPM, DABIPP, FIPP Date: 09/08/2016; Time: 2:42 PM  Patient instructions provided during this appointment: Patient Instructions   ____________________________________________________________________________________________  Pain Scale  Introduction: The pain score used by this practice is the Verbal Numerical Rating Scale (VNRS-11). This is an 11-point scale. It is for adults and children 10 years or older. There are significant differences in how the pain score is reported, used, and applied. Forget everything you learned in the past and learn this scoring system.  General Information: The scale should reflect your current level of pain. Unless you are specifically asked for the level of your  worst pain, or your average pain. If you are asked for one of these two, then it should be understood that it is over the past 24 hours.  Basic Activities of Daily Living (ADL): Personal hygiene, dressing, eating, transferring, and using restroom.  Instructions: Most patients tend to report their level of pain as a combination of two factors, their physical pain and their psychosocial pain. This last one is also known as "suffering" and it is reflection of how physical pain affects you socially and psychologically. From now on, report them separately. From this point on, when asked to report your pain level, report only your physical pain. Use the following table for reference.  Pain Clinic Pain Levels (0-5/10)  Pain Level Score  Description  No Pain 0   Mild pain 1 Nagging, annoying, but does not interfere with basic activities of daily living (ADL). Patients are able to eat, bathe, get dressed, toileting (being able to get on  and off the toilet and perform personal hygiene functions), transfer (move in and out of bed or a chair without assistance), and maintain continence (able to control bladder and bowel functions). Blood pressure and heart rate are unaffected. A normal heart rate for a healthy adult ranges from 60 to 100 bpm (beats per minute).   Mild to moderate pain 2 Noticeable and distracting. Impossible to hide from other people. More frequent flare-ups. Still possible to adapt and function close to normal. It can be very annoying and may have occasional stronger flare-ups. With discipline, patients may get used to it and adapt.   Moderate pain 3 Interferes significantly with activities of daily living (ADL). It becomes difficult to feed, bathe, get dressed, get on and off the toilet or to perform personal hygiene functions. Difficult to get in and out of bed or a chair without assistance. Very distracting. With effort, it can be ignored when deeply involved in activities.   Moderately  severe pain 4 Impossible to ignore for more than a few minutes. With effort, patients may still be able to manage work or participate in some social activities. Very difficult to concentrate. Signs of autonomic nervous system discharge are evident: dilated pupils (mydriasis); mild sweating (diaphoresis); sleep interference. Heart rate becomes elevated (>115 bpm). Diastolic blood pressure (lower number) rises above 100 mmHg. Patients find relief in laying down and not moving.   Severe pain 5 Intense and extremely unpleasant. Associated with frowning face and frequent crying. Pain overwhelms the senses.  Ability to do any activity or maintain social relationships becomes significantly limited. Conversation becomes difficult. Pacing back and forth is common, as getting into a comfortable position is nearly impossible. Pain wakes you up from deep sleep. Physical signs will be obvious: pupillary dilation; increased sweating; goosebumps; brisk reflexes; cold, clammy hands and feet; nausea, vomiting or dry heaves; loss of appetite; significant sleep disturbance with inability to fall asleep or to remain asleep. When persistent, significant weight loss is observed due to the complete loss of appetite and sleep deprivation.  Blood pressure and heart rate becomes significantly elevated. Caution: If elevated blood pressure triggers a pounding headache associated with blurred vision, then the patient should immediately seek attention at an urgent or emergency care unit, as these may be signs of an impending stroke.    Emergency Department Pain Levels (6-10/10)  Emergency Room Pain 6 Severely limiting. Requires emergency care and should not be seen or managed at an outpatient pain management facility. Communication becomes difficult and requires great effort. Assistance to reach the emergency department may be required. Facial flushing and profuse sweating along with potentially dangerous increases in heart rate and  blood pressure will be evident.   Distressing pain 7 Self-care is very difficult. Assistance is required to transport, or use restroom. Assistance to reach the emergency department will be required. Tasks requiring coordination, such as bathing and getting dressed become very difficult.   Disabling pain 8 Self-care is no longer possible. At this level, pain is disabling. The individual is unable to do even the most "basic" activities such as walking, eating, bathing, dressing, transferring to a bed, or toileting. Fine motor skills are lost. It is difficult to think clearly.   Incapacitating pain 9 Pain becomes incapacitating. Thought processing is no longer possible. Difficult to remember your own name. Control of movement and coordination are lost.   The worst pain imaginable 10 At this level, most patients pass out from pain. When this level is reached, collapse  of the autonomic nervous system occurs, leading to a sudden drop in blood pressure and heart rate. This in turn results in a temporary and dramatic drop in blood flow to the brain, leading to a loss of consciousness. Fainting is one of the body's self defense mechanisms. Passing out puts the brain in a calmed state and causes it to shut down for a while, in order to begin the healing process.    Summary: 1. Refer to this scale when providing [place] with your pain level. 2. Be accurate and careful when reporting your pain level. This will help with your care. 3. Over-reporting your pain level will lead to loss of credibility. 4. Even a level of 1/10 means that there is pain and will be treated at our facility. 5. High, inaccurate reporting will be documented as "Symptom Exaggeration", leading to loss of credibility and suspicions of possible secondary gains such as obtaining more narcotics, or wanting to appear disabled, for fraudulent reasons. 6. Only pain levels of 5 or below will be seen at our facility. 7. Pain levels of 6 and above will  be sent to the Emergency Department and the appointment cancelled. ____________________________________________________________________________________________   ____________________________________________________________________________________________  Appointment Policy Summary  It is our goal and responsibility to provide the medical community with assistance in the evaluation and management of patients with chronic pain. Unfortunately our resources are limited. Because we do not have an unlimited amount of time, or available appointments, we are required to closely monitor and manage their use. The following rules exist to maximize their use:  Patient's responsibilities: 1. Punctuality: You are required to be physically present and registered in our facility at least 30 minutes before your appointment. 2. Tardiness: The cutoff is your appointment time. If you have an appointment scheduled for 10:00 AM and you arrive at 10:01, you will be required to reschedule your appointment.  3. Plan ahead: Always assume that you will encounter traffic on your way in. Plan for it. If you are dependent on a driver, make sure they understand these rules and the need to arrive early. 4. Other appointments and responsibilities: Avoid scheduling any other appointments before or after your pain clinic appointments.  5. Be prepared: Write down everything that you need to discuss with your healthcare provider and give this information to the admitting nurse. Write down the medications that you will need refilled. Bring your pills and bottles (even the empty ones), to all of your appointments, except for those where a procedure is scheduled. 6. No children or pets: Find someone to take care of them. It is not appropriate to bring them in. 7. Scheduling changes: We request "advanced notification" of any changes or cancellations. 8. Advanced notification: Defined as a time period of more than 24 hours prior to the  originally scheduled appointment. This allows for the appointment to be offered to other patients. 9. Rescheduling: When a visit is rescheduled, it will require the cancellation of the original appointment. For this reason they both fall within the category of "Cancellations".  10. Cancellations: They require advanced notification. Any cancellation less than 24 hours before the  appointment will be recorded as a "No Show". 11. No Show: Defined as an unkept appointment where the patient failed to notify or declare to the practice their intention or inability to keep the appointment.  Corrective process for repeat offenders:  1. Tardiness: Three (3) episodes of rescheduling due to late arrivals will be recorded as one (1) "No Show". 2.  Cancellation or reschedule: Three (3) cancellations or rescheduling will be recorded as one (1) "No Show". 3. "No Shows": Three (3) "No Shows" within a 12 month period will result in discharge from the practice.  ____________________________________________________________________________________________  Pain Management Discharge Instructions  General Discharge Instructions :  If you need to reach your doctor call: Monday-Friday 8:00 am - 4:00 pm at [phone] or toll free [phone].  After clinic hours [phone] to have operator reach doctor.  Bring all of your medication bottles to all your appointments in the pain clinic.  To cancel or reschedule your appointment with Pain Management please remember to call 24 hours in advance to avoid a fee.  Refer to the educational materials which you have been given on: General Risks, I had my Procedure. Discharge Instructions, Post Sedation.  Post Procedure Instructions:  The drugs you were given will stay in your system until tomorrow, so for the next 24 hours you should not drive, make any legal decisions or drink any alcoholic beverages.  You may eat anything you prefer, but it is better to start with  liquids then soups and crackers, and gradually work up to solid foods.  Please notify your doctor immediately if you have any unusual bleeding, trouble breathing or pain that is not related to your normal pain.  Depending on the type of procedure that was done, some parts of your body may feel week and/or numb.  This usually clears up by tonight or the next day.  Walk with the use of an assistive device or accompanied by an adult for the 24 hours.  You may use ice on the affected area for the first 24 hours.  Put ice in a Ziploc bag and cover with a towel and place against area 15 minutes on 15 minutes off.  You may switch to heat after 24 hours.GENERAL RISKS AND COMPLICATIONS  What are the risk, side effects and possible complications? Generally speaking, most procedures are safe.  However, with any procedure there are risks, side effects, and the possibility of complications.  The risks and complications are dependent upon the sites that are lesioned, or the type of nerve block to be performed.  The closer the procedure is to the spine, the more serious the risks are.  Great care is taken when placing the radio frequency needles, block needles or lesioning probes, but sometimes complications can occur. 1. Infection: Any time there is an injection through the skin, there is a risk of infection.  This is why sterile conditions are used for these blocks.  There are four possible types of infection. 1. Localized skin infection. 2. Central Nervous System Infection-This can be in the form of Meningitis, which can be deadly. 3. Epidural Infections-This can be in the form of an epidural abscess, which can cause pressure inside of the spine, causing compression of the spinal cord with subsequent paralysis. This would require an emergency surgery to decompress, and there are no guarantees that the patient would recover from the paralysis. 4. Discitis-This is an infection of the intervertebral discs.  It  occurs in about 1% of discography procedures.  It is difficult to treat and it may lead to surgery.        2. Pain: the needles have to go through skin and soft tissues, will cause soreness.       3. Damage to internal structures:  The nerves to be lesioned may be near blood vessels or    other nerves which can be  potentially damaged.       4. Bleeding: Bleeding is more common if the patient is taking blood thinners such as  aspirin, Coumadin, Ticiid, Plavix, etc., or if he/she have some genetic predisposition  such as hemophilia. Bleeding into the spinal canal can cause compression of the spinal  cord with subsequent paralysis.  This would require an emergency surgery to  decompress and there are no guarantees that the patient would recover from the  paralysis.       5. Pneumothorax:  Puncturing of a lung is a possibility, every time a needle is introduced in  the area of the chest or upper back.  Pneumothorax refers to free air around the  collapsed lung(s), inside of the thoracic cavity (chest cavity).  Another two possible  complications related to a similar event would include: Hemothorax and Chylothorax.   These are variations of the Pneumothorax, where instead of air around the collapsed  lung(s), you may have blood or chyle, respectively.       6. Spinal headaches: They may occur with any procedures in the area of the spine.       7. Persistent CSF (Cerebro-Spinal Fluid) leakage: This is a rare problem, but may occur  with prolonged intrathecal or epidural catheters either due to the formation of a fistulous  track or a dural tear.       8. Nerve damage: By working so close to the spinal cord, there is always a possibility of  nerve damage, which could be as serious as a permanent spinal cord injury with  paralysis.       9. Death:  Although rare, severe deadly allergic reactions known as "Anaphylactic  reaction" can occur to any of the medications used.      10. Worsening of the symptoms:  We can  always make thing worse.  What are the chances of something like this happening? Chances of any of this occuring are extremely low.  By statistics, you have more of a chance of getting killed in a motor vehicle accident: while driving to the hospital than any of the above occurring .  Nevertheless, you should be aware that they are possibilities.  In general, it is similar to taking a shower.  Everybody knows that you can slip, hit your head and get killed.  Does that mean that you should not shower again?  Nevertheless always keep in mind that statistics do not mean anything if you happen to be on the wrong side of them.  Even if a procedure has a 1 (one) in a 1,000,000 (million) chance of going wrong, it you happen to be that one..Also, keep in mind that by statistics, you have more of a chance of having something go wrong when taking medications.  Who should not have this procedure? If you are on a blood thinning medication (e.g. Coumadin, Plavix, see list of "Blood Thinners"), or if you have an active infection going on, you should not have the procedure.  If you are taking any blood thinners, please inform your physician.  How should I prepare for this procedure?  Do not eat or drink anything at least six hours prior to the procedure.  Bring a driver with you .  It cannot be a taxi.  Come accompanied by an adult that can drive you back, and that is strong enough to help you if your legs get weak or numb from the local anesthetic.  Take all of your medicines the morning  of the procedure with just enough water to swallow them.  If you have diabetes, make sure that you are scheduled to have your procedure done first thing in the morning, whenever possible.  If you have diabetes, take only half of your insulin dose and notify our nurse that you have done so as soon as you arrive at the clinic.  If you are diabetic, but only take blood sugar pills (oral hypoglycemic), then do not take them on  the morning of your procedure.  You may take them after you have had the procedure.  Do not take aspirin or any aspirin-containing medications, at least eleven (11) days prior to the procedure.  They may prolong bleeding.  Wear loose fitting clothing that may be easy to take off and that you would not mind if it got stained with Betadine or blood.  Do not wear any jewelry or perfume  Remove any nail coloring.  It will interfere with some of our monitoring equipment.  NOTE: Remember that this is not meant to be interpreted as a complete list of all possible complications.  Unforeseen problems may occur.  BLOOD THINNERS The following drugs contain aspirin or other products, which can cause increased bleeding during surgery and should not be taken for 2 weeks prior to and 1 week after surgery.  If you should need take something for relief of minor pain, you may take acetaminophen which is found in Tylenol,m Datril, Anacin-3 and Panadol. It is not blood thinner. The products listed below are.  Do not take any of the products listed below in addition to any listed on your instruction sheet.  A.P.C or A.P.C with Codeine Codeine Phosphate Capsules #3 Ibuprofen Ridaura  ABC compound Congesprin Imuran rimadil  Advil Cope Indocin Robaxisal  Alka-Seltzer Effervescent Pain Reliever and Antacid Coricidin or Coricidin-D  Indomethacin Rufen  Alka-Seltzer plus Cold Medicine Cosprin Ketoprofen S-A-C Tablets  Anacin Analgesic Tablets or Capsules Coumadin Korlgesic Salflex  Anacin Extra Strength Analgesic tablets or capsules CP-2 Tablets Lanoril Salicylate  Anaprox Cuprimine Capsules Levenox Salocol  Anexsia-D Dalteparin Magan Salsalate  Anodynos Darvon compound Magnesium Salicylate Sine-off  Ansaid Dasin Capsules Magsal Sodium Salicylate  Anturane Depen Capsules Marnal Soma  APF Arthritis pain formula Dewitt's Pills Measurin Stanback  Argesic Dia-Gesic Meclofenamic Sulfinpyrazone  Arthritis Bayer Timed  Release Aspirin Diclofenac Meclomen Sulindac  Arthritis pain formula Anacin Dicumarol Medipren Supac  Analgesic (Safety coated) Arthralgen Diffunasal Mefanamic Suprofen  Arthritis Strength Bufferin Dihydrocodeine Mepro Compound Suprol  Arthropan liquid Dopirydamole Methcarbomol with Aspirin Synalgos  ASA tablets/Enseals Disalcid Micrainin Tagament  Ascriptin Doan's Midol Talwin  Ascriptin A/D Dolene Mobidin Tanderil  Ascriptin Extra Strength Dolobid Moblgesic Ticlid  Ascriptin with Codeine Doloprin or Doloprin with Codeine Momentum Tolectin  Asperbuf Duoprin Mono-gesic Trendar  Aspergum Duradyne Motrin or Motrin IB Triminicin  Aspirin plain, buffered or enteric coated Durasal Myochrisine Trigesic  Aspirin Suppositories Easprin Nalfon Trillsate  Aspirin with Codeine Ecotrin Regular or Extra Strength Naprosyn Uracel  Atromid-S Efficin Naproxen Ursinus  Auranofin Capsules Elmiron Neocylate Vanquish  Axotal Emagrin Norgesic Verin  Azathioprine Empirin or Empirin with Codeine Normiflo Vitamin E  Azolid Emprazil Nuprin Voltaren  Bayer Aspirin plain, buffered or children's or timed BC Tablets or powders Encaprin Orgaran Warfarin Sodium  Buff-a-Comp Enoxaparin Orudis Zorpin  Buff-a-Comp with Codeine Equegesic Os-Cal-Gesic   Buffaprin Excedrin plain, buffered or Extra Strength Oxalid   Bufferin Arthritis Strength Feldene Oxphenbutazone   Bufferin plain or Extra Strength Feldene Capsules Oxycodone with Aspirin   Bufferin  with Codeine Fenoprofen Fenoprofen Pabalate or Pabalate-SF   Buffets II Flogesic Panagesic   Buffinol plain or Extra Strength Florinal or Florinal with Codeine Panwarfarin   Buf-Tabs Flurbiprofen Penicillamine   Butalbital Compound Four-way cold tablets Penicillin   Butazolidin Fragmin Pepto-Bismol   Carbenicillin Geminisyn Percodan   Carna Arthritis Reliever Geopen Persantine   Carprofen Gold's salt Persistin   Chloramphenicol Goody's Phenylbutazone   Chloromycetin  Haltrain Piroxlcam   Clmetidine heparin Plaquenil   Cllnoril Hyco-pap Ponstel   Clofibrate Hydroxy chloroquine Propoxyphen         Before stopping any of these medications, be sure to consult the physician who ordered them.  Some, such as Coumadin (Warfarin) are ordered to prevent or treat serious conditions such as "deep thrombosis", "pumonary embolisms", and other heart problems.  The amount of time that you may need off of the medication may also vary with the medication and the reason for which you were taking it.  If you are taking any of these medications, please make sure you notify your pain physician before you undergo any procedures.          Epidural Steroid Injection An epidural steroid injection is a shot of steroid medicine and numbing medicine that is given into the space between the spinal cord and the bones in your back (epidural space). The shot helps relieve pain caused by an irritated or swollen nerve root. The amount of pain relief you get from the injection depends on what is causing the nerve to be swollen and irritated, and how long your pain lasts. You are more likely to benefit from this injection if your pain is strong and comes on suddenly rather than if you have had pain for a long time. Tell a health care provider about:  Any allergies you have.  All medicines you are taking, including vitamins, herbs, eye drops, creams, and over-the-counter medicines.  Any problems you or family members have had with anesthetic medicines.  Any blood disorders you have.  Any surgeries you have had.  Any medical conditions you have.  Whether you are pregnant or may be pregnant. What are the risks? Generally, this is a safe procedure. However, problems may occur, including:  Headache.  Bleeding.  Infection.  Allergic reaction to medicines.  Damage to your nerves.  What happens before the procedure? Staying hydrated Follow instructions from your health care  provider about hydration, which may include:  Up to 2 hours before the procedure - you may continue to drink clear liquids, such as water, clear fruit juice, black coffee, and plain tea.  Eating and drinking restrictions Follow instructions from your health care provider about eating and drinking, which may include:  8 hours before the procedure - stop eating heavy meals or foods such as meat, fried foods, or fatty foods.  6 hours before the procedure - stop eating light meals or foods, such as toast or cereal.  6 hours before the procedure - stop drinking milk or drinks that contain milk.  2 hours before the procedure - stop drinking clear liquids.  Medicine  You may be given medicines to lower anxiety.  Ask your health care provider about: ? Changing or stopping your regular medicines. This is especially important if you are taking diabetes medicines or blood thinners. ? Taking medicines such as aspirin and ibuprofen. These medicines can thin your blood. Do not take these medicines before your procedure if your health care provider instructs you not to. General instructions  Plan  to have someone take you home from the hospital or clinic. What happens during the procedure?  You may receive a medicine to help you relax (sedative).  You will be asked to lie on your abdomen.  The injection site will be cleaned.  A numbing medicine (local anesthetic) will be used to numb the injection site.  A needle will be inserted through your skin into the epidural space. You may feel some discomfort when this happens. An X-ray machine will be used to make sure the needle is put as close as possible to the affected nerve.  A steroid medicine and a local anesthetic will be injected into the epidural space.  The needle will be removed.  A bandage (dressing) will be put over the injection site. What happens after the procedure?  Your blood pressure, heart rate, breathing rate, and blood  oxygen level will be monitored until the medicines you were given have worn off.  Your arm or leg may feel weak or numb for a few hours.  The injection site may feel sore.  Do not drive for 24 hours if you received a sedative. This information is not intended to replace advice given to you by your health care provider. Make sure you discuss any questions you have with your health care provider. Document Released: 06/14/2007 Document Revised: 08/19/2015 Document Reviewed: 06/23/2015 [employer] Interactive Patient Education  2017 [employer].

## 2016-09-08 NOTE — Patient Instructions (Addendum)
____________________________________________________________________________________________  Pain Scale  Introduction: The pain score used by this practice is the Verbal Numerical Rating Scale (VNRS-11). This is an 11-point scale. It is for adults and children 10 years or older. There are significant differences in how the pain score is reported, used, and applied. Forget everything you learned in the past and learn this scoring system.  General Information: The scale should reflect your current level of pain. Unless you are specifically asked for the level of your worst pain, or your average pain. If you are asked for one of these two, then it should be understood that it is over the past 24 hours.  Basic Activities of Daily Living (ADL): Personal hygiene, dressing, eating, transferring, and using restroom.  Instructions: Most patients tend to report their level of pain as a combination of two factors, their physical pain and their psychosocial pain. This last one is also known as "suffering" and it is reflection of how physical pain affects you socially and psychologically. From now on, report them separately. From this point on, when asked to report your pain level, report only your physical pain. Use the following table for reference.  Pain Clinic Pain Levels (0-5/10)  Pain Level Score  Description  No Pain 0   Mild pain 1 Nagging, annoying, but does not interfere with basic activities of daily living (ADL). Patients are able to eat, bathe, get dressed, toileting (being able to get on and off the toilet and perform personal hygiene functions), transfer (move in and out of bed or a chair without assistance), and maintain continence (able to control bladder and bowel functions). Blood pressure and heart rate are unaffected. A normal heart rate for a healthy adult ranges from 60 to 100 bpm (beats per minute).   Mild to moderate pain 2 Noticeable and distracting. Impossible to hide from other  people. More frequent flare-ups. Still possible to adapt and function close to normal. It can be very annoying and may have occasional stronger flare-ups. With discipline, patients may get used to it and adapt.   Moderate pain 3 Interferes significantly with activities of daily living (ADL). It becomes difficult to feed, bathe, get dressed, get on and off the toilet or to perform personal hygiene functions. Difficult to get in and out of bed or a chair without assistance. Very distracting. With effort, it can be ignored when deeply involved in activities.   Moderately severe pain 4 Impossible to ignore for more than a few minutes. With effort, patients may still be able to manage work or participate in some social activities. Very difficult to concentrate. Signs of autonomic nervous system discharge are evident: dilated pupils (mydriasis); mild sweating (diaphoresis); sleep interference. Heart rate becomes elevated (>115 bpm). Diastolic blood pressure (lower number) rises above 100 mmHg. Patients find relief in laying down and not moving.   Severe pain 5 Intense and extremely unpleasant. Associated with frowning face and frequent crying. Pain overwhelms the senses.  Ability to do any activity or maintain social relationships becomes significantly limited. Conversation becomes difficult. Pacing back and forth is common, as getting into a comfortable position is nearly impossible. Pain wakes you up from deep sleep. Physical signs will be obvious: pupillary dilation; increased sweating; goosebumps; brisk reflexes; cold, clammy hands and feet; nausea, vomiting or dry heaves; loss of appetite; significant sleep disturbance with inability to fall asleep or to remain asleep. When persistent, significant weight loss is observed due to the complete loss of appetite and sleep deprivation.  Blood   pressure and heart rate becomes significantly elevated. Caution: If elevated blood pressure triggers a pounding headache  associated with blurred vision, then the patient should immediately seek attention at an urgent or emergency care unit, as these may be signs of an impending stroke.    Emergency Department Pain Levels (6-10/10)  Emergency Room Pain 6 Severely limiting. Requires emergency care and should not be seen or managed at an outpatient pain management facility. Communication becomes difficult and requires great effort. Assistance to reach the emergency department may be required. Facial flushing and profuse sweating along with potentially dangerous increases in heart rate and blood pressure will be evident.   Distressing pain 7 Self-care is very difficult. Assistance is required to transport, or use restroom. Assistance to reach the emergency department will be required. Tasks requiring coordination, such as bathing and getting dressed become very difficult.   Disabling pain 8 Self-care is no longer possible. At this level, pain is disabling. The individual is unable to do even the most "basic" activities such as walking, eating, bathing, dressing, transferring to a bed, or toileting. Fine motor skills are lost. It is difficult to think clearly.   Incapacitating pain 9 Pain becomes incapacitating. Thought processing is no longer possible. Difficult to remember your own name. Control of movement and coordination are lost.   The worst pain imaginable 10 At this level, most patients pass out from pain. When this level is reached, collapse of the autonomic nervous system occurs, leading to a sudden drop in blood pressure and heart rate. This in turn results in a temporary and dramatic drop in blood flow to the brain, leading to a loss of consciousness. Fainting is one of the body's self defense mechanisms. Passing out puts the brain in a calmed state and causes it to shut down for a while, in order to begin the healing process.    Summary: 1. Refer to this scale when providing [place] with your pain level. 2. Be  accurate and careful when reporting your pain level. This will help with your care. 3. Over-reporting your pain level will lead to loss of credibility. 4. Even a level of 1/10 means that there is pain and will be treated at our facility. 5. High, inaccurate reporting will be documented as "Symptom Exaggeration", leading to loss of credibility and suspicions of possible secondary gains such as obtaining more narcotics, or wanting to appear disabled, for fraudulent reasons. 6. Only pain levels of 5 or below will be seen at our facility. 7. Pain levels of 6 and above will be sent to the Emergency Department and the appointment cancelled. ____________________________________________________________________________________________   ____________________________________________________________________________________________  Appointment Policy Summary  It is our goal and responsibility to provide the medical community with assistance in the evaluation and management of patients with chronic pain. Unfortunately our resources are limited. Because we do not have an unlimited amount of time, or available appointments, we are required to closely monitor and manage their use. The following rules exist to maximize their use:  Patient's responsibilities: 1. Punctuality: You are required to be physically present and registered in our facility at least 30 minutes before your appointment. 2. Tardiness: The cutoff is your appointment time. If you have an appointment scheduled for 10:00 AM and you arrive at 10:01, you will be required to reschedule your appointment.  3. Plan ahead: Always assume that you will encounter traffic on your way in. Plan for it. If you are dependent on a driver, make sure they understand these  rules and the need to arrive early. 4. Other appointments and responsibilities: Avoid scheduling any other appointments before or after your pain clinic appointments.  5. Be prepared: Write down  everything that you need to discuss with your healthcare provider and give this information to the admitting nurse. Write down the medications that you will need refilled. Bring your pills and bottles (even the empty ones), to all of your appointments, except for those where a procedure is scheduled. 6. No children or pets: Find someone to take care of them. It is not appropriate to bring them in. 7. Scheduling changes: We request "advanced notification" of any changes or cancellations. 8. Advanced notification: Defined as a time period of more than 24 hours prior to the originally scheduled appointment. This allows for the appointment to be offered to other patients. 9. Rescheduling: When a visit is rescheduled, it will require the cancellation of the original appointment. For this reason they both fall within the category of "Cancellations".  10. Cancellations: They require advanced notification. Any cancellation less than 24 hours before the  appointment will be recorded as a "No Show". 11. No Show: Defined as an unkept appointment where the patient failed to notify or declare to the practice their intention or inability to keep the appointment.  Corrective process for repeat offenders:  1. Tardiness: Three (3) episodes of rescheduling due to late arrivals will be recorded as one (1) "No Show". 2. Cancellation or reschedule: Three (3) cancellations or rescheduling will be recorded as one (1) "No Show". 3. "No Shows": Three (3) "No Shows" within a 12 month period will result in discharge from the practice.  ____________________________________________________________________________________________  Pain Management Discharge Instructions  General Discharge Instructions :  If you need to reach your doctor call: Monday-Friday 8:00 am - 4:00 pm at [phone] or toll free [phone].  After clinic hours [phone] to have operator reach doctor.  Bring all of your medication bottles to all  your appointments in the pain clinic.  To cancel or reschedule your appointment with Pain Management please remember to call 24 hours in advance to avoid a fee.  Refer to the educational materials which you have been given on: General Risks, I had my Procedure. Discharge Instructions, Post Sedation.  Post Procedure Instructions:  The drugs you were given will stay in your system until tomorrow, so for the next 24 hours you should not drive, make any legal decisions or drink any alcoholic beverages.  You may eat anything you prefer, but it is better to start with liquids then soups and crackers, and gradually work up to solid foods.  Please notify your doctor immediately if you have any unusual bleeding, trouble breathing or pain that is not related to your normal pain.  Depending on the type of procedure that was done, some parts of your body may feel week and/or numb.  This usually clears up by tonight or the next day.  Walk with the use of an assistive device or accompanied by an adult for the 24 hours.  You may use ice on the affected area for the first 24 hours.  Put ice in a Ziploc bag and cover with a towel and place against area 15 minutes on 15 minutes off.  You may switch to heat after 24 hours.GENERAL RISKS AND COMPLICATIONS  What are the risk, side effects and possible complications? Generally speaking, most procedures are safe.  However, with any procedure there are risks, side effects, and the possibility of complications.  The risks and complications are dependent upon the sites that are lesioned, or the type of nerve block to be performed.  The closer the procedure is to the spine, the more serious the risks are.  Great care is taken when placing the radio frequency needles, block needles or lesioning probes, but sometimes complications can occur. 1. Infection: Any time there is an injection through the skin, there is a risk of infection.  This is why sterile conditions are used  for these blocks.  There are four possible types of infection. 1. Localized skin infection. 2. Central Nervous System Infection-This can be in the form of Meningitis, which can be deadly. 3. Epidural Infections-This can be in the form of an epidural abscess, which can cause pressure inside of the spine, causing compression of the spinal cord with subsequent paralysis. This would require an emergency surgery to decompress, and there are no guarantees that the patient would recover from the paralysis. 4. Discitis-This is an infection of the intervertebral discs.  It occurs in about 1% of discography procedures.  It is difficult to treat and it may lead to surgery.        2. Pain: the needles have to go through skin and soft tissues, will cause soreness.       3. Damage to internal structures:  The nerves to be lesioned may be near blood vessels or    other nerves which can be potentially damaged.       4. Bleeding: Bleeding is more common if the patient is taking blood thinners such as  aspirin, Coumadin, Ticiid, Plavix, etc., or if he/she have some genetic predisposition  such as hemophilia. Bleeding into the spinal canal can cause compression of the spinal  cord with subsequent paralysis.  This would require an emergency surgery to  decompress and there are no guarantees that the patient would recover from the  paralysis.       5. Pneumothorax:  Puncturing of a lung is a possibility, every time a needle is introduced in  the area of the chest or upper back.  Pneumothorax refers to free air around the  collapsed lung(s), inside of the thoracic cavity (chest cavity).  Another two possible  complications related to a similar event would include: Hemothorax and Chylothorax.   These are variations of the Pneumothorax, where instead of air around the collapsed  lung(s), you may have blood or chyle, respectively.       6. Spinal headaches: They may occur with any procedures in the area of the spine.        7. Persistent CSF (Cerebro-Spinal Fluid) leakage: This is a rare problem, but may occur  with prolonged intrathecal or epidural catheters either due to the formation of a fistulous  track or a dural tear.       8. Nerve damage: By working so close to the spinal cord, there is always a possibility of  nerve damage, which could be as serious as a permanent spinal cord injury with  paralysis.       9. Death:  Although rare, severe deadly allergic reactions known as "Anaphylactic  reaction" can occur to any of the medications used.      10. Worsening of the symptoms:  We can always make thing worse.  What are the chances of something like this happening? Chances of any of this occuring are extremely low.  By statistics, you have more of a chance of getting killed in a motor vehicle  accident: while driving to the hospital than any of the above occurring .  Nevertheless, you should be aware that they are possibilities.  In general, it is similar to taking a shower.  Everybody knows that you can slip, hit your head and get killed.  Does that mean that you should not shower again?  Nevertheless always keep in mind that statistics do not mean anything if you happen to be on the wrong side of them.  Even if a procedure has a 1 (one) in a 1,000,000 (million) chance of going wrong, it you happen to be that one..Also, keep in mind that by statistics, you have more of a chance of having something go wrong when taking medications.  Who should not have this procedure? If you are on a blood thinning medication (e.g. Coumadin, Plavix, see list of "Blood Thinners"), or if you have an active infection going on, you should not have the procedure.  If you are taking any blood thinners, please inform your physician.  How should I prepare for this procedure?  Do not eat or drink anything at least six hours prior to the procedure.  Bring a driver with you .  It cannot be a taxi.  Come accompanied by an adult that can drive  you back, and that is strong enough to help you if your legs get weak or numb from the local anesthetic.  Take all of your medicines the morning of the procedure with just enough water to swallow them.  If you have diabetes, make sure that you are scheduled to have your procedure done first thing in the morning, whenever possible.  If you have diabetes, take only half of your insulin dose and notify our nurse that you have done so as soon as you arrive at the clinic.  If you are diabetic, but only take blood sugar pills (oral hypoglycemic), then do not take them on the morning of your procedure.  You may take them after you have had the procedure.  Do not take aspirin or any aspirin-containing medications, at least eleven (11) days prior to the procedure.  They may prolong bleeding.  Wear loose fitting clothing that may be easy to take off and that you would not mind if it got stained with Betadine or blood.  Do not wear any jewelry or perfume  Remove any nail coloring.  It will interfere with some of our monitoring equipment.  NOTE: Remember that this is not meant to be interpreted as a complete list of all possible complications.  Unforeseen problems may occur.  BLOOD THINNERS The following drugs contain aspirin or other products, which can cause increased bleeding during surgery and should not be taken for 2 weeks prior to and 1 week after surgery.  If you should need take something for relief of minor pain, you may take acetaminophen which is found in Tylenol,m Datril, Anacin-3 and Panadol. It is not blood thinner. The products listed below are.  Do not take any of the products listed below in addition to any listed on your instruction sheet.  A.P.C or A.P.C with Codeine Codeine Phosphate Capsules #3 Ibuprofen Ridaura  ABC compound Congesprin Imuran rimadil  Advil Cope Indocin Robaxisal  Alka-Seltzer Effervescent Pain Reliever and Antacid Coricidin or Coricidin-D  Indomethacin Rufen   Alka-Seltzer plus Cold Medicine Cosprin Ketoprofen S-A-C Tablets  Anacin Analgesic Tablets or Capsules Coumadin Korlgesic Salflex  Anacin Extra Strength Analgesic tablets or capsules CP-2 Tablets Lanoril Salicylate  Anaprox Cuprimine Capsules Levenox  Salocol  Anexsia-D Dalteparin Magan Salsalate  Anodynos Darvon compound Magnesium Salicylate Sine-off  Ansaid Dasin Capsules Magsal Sodium Salicylate  Anturane Depen Capsules Marnal Soma  APF Arthritis pain formula Dewitt's Pills Measurin Stanback  Argesic Dia-Gesic Meclofenamic Sulfinpyrazone  Arthritis Bayer Timed Release Aspirin Diclofenac Meclomen Sulindac  Arthritis pain formula Anacin Dicumarol Medipren Supac  Analgesic (Safety coated) Arthralgen Diffunasal Mefanamic Suprofen  Arthritis Strength Bufferin Dihydrocodeine Mepro Compound Suprol  Arthropan liquid Dopirydamole Methcarbomol with Aspirin Synalgos  ASA tablets/Enseals Disalcid Micrainin Tagament  Ascriptin Doan's Midol Talwin  Ascriptin A/D Dolene Mobidin Tanderil  Ascriptin Extra Strength Dolobid Moblgesic Ticlid  Ascriptin with Codeine Doloprin or Doloprin with Codeine Momentum Tolectin  Asperbuf Duoprin Mono-gesic Trendar  Aspergum Duradyne Motrin or Motrin IB Triminicin  Aspirin plain, buffered or enteric coated Durasal Myochrisine Trigesic  Aspirin Suppositories Easprin Nalfon Trillsate  Aspirin with Codeine Ecotrin Regular or Extra Strength Naprosyn Uracel  Atromid-S Efficin Naproxen Ursinus  Auranofin Capsules Elmiron Neocylate Vanquish  Axotal Emagrin Norgesic Verin  Azathioprine Empirin or Empirin with Codeine Normiflo Vitamin E  Azolid Emprazil Nuprin Voltaren  Bayer Aspirin plain, buffered or children's or timed BC Tablets or powders Encaprin Orgaran Warfarin Sodium  Buff-a-Comp Enoxaparin Orudis Zorpin  Buff-a-Comp with Codeine Equegesic Os-Cal-Gesic   Buffaprin Excedrin plain, buffered or Extra Strength Oxalid   Bufferin Arthritis Strength Feldene  Oxphenbutazone   Bufferin plain or Extra Strength Feldene Capsules Oxycodone with Aspirin   Bufferin with Codeine Fenoprofen Fenoprofen Pabalate or Pabalate-SF   Buffets II Flogesic Panagesic   Buffinol plain or Extra Strength Florinal or Florinal with Codeine Panwarfarin   Buf-Tabs Flurbiprofen Penicillamine   Butalbital Compound Four-way cold tablets Penicillin   Butazolidin Fragmin Pepto-Bismol   Carbenicillin Geminisyn Percodan   Carna Arthritis Reliever Geopen Persantine   Carprofen Gold's salt Persistin   Chloramphenicol Goody's Phenylbutazone   Chloromycetin Haltrain Piroxlcam   Clmetidine heparin Plaquenil   Cllnoril Hyco-pap Ponstel   Clofibrate Hydroxy chloroquine Propoxyphen         Before stopping any of these medications, be sure to consult the physician who ordered them.  Some, such as Coumadin (Warfarin) are ordered to prevent or treat serious conditions such as "deep thrombosis", "pumonary embolisms", and other heart problems.  The amount of time that you may need off of the medication may also vary with the medication and the reason for which you were taking it.  If you are taking any of these medications, please make sure you notify your pain physician before you undergo any procedures.          Epidural Steroid Injection An epidural steroid injection is a shot of steroid medicine and numbing medicine that is given into the space between the spinal cord and the bones in your back (epidural space). The shot helps relieve pain caused by an irritated or swollen nerve root. The amount of pain relief you get from the injection depends on what is causing the nerve to be swollen and irritated, and how long your pain lasts. You are more likely to benefit from this injection if your pain is strong and comes on suddenly rather than if you have had pain for a long time. Tell a health care provider about:  Any allergies you have.  All medicines you are taking, including  vitamins, herbs, eye drops, creams, and over-the-counter medicines.  Any problems you or family members have had with anesthetic medicines.  Any blood disorders you have.  Any surgeries you have had.  Any medical  conditions you have.  Whether you are pregnant or may be pregnant. What are the risks? Generally, this is a safe procedure. However, problems may occur, including:  Headache.  Bleeding.  Infection.  Allergic reaction to medicines.  Damage to your nerves.  What happens before the procedure? Staying hydrated Follow instructions from your health care provider about hydration, which may include:  Up to 2 hours before the procedure - you may continue to drink clear liquids, such as water, clear fruit juice, black coffee, and plain tea.  Eating and drinking restrictions Follow instructions from your health care provider about eating and drinking, which may include:  8 hours before the procedure - stop eating heavy meals or foods such as meat, fried foods, or fatty foods.  6 hours before the procedure - stop eating light meals or foods, such as toast or cereal.  6 hours before the procedure - stop drinking milk or drinks that contain milk.  2 hours before the procedure - stop drinking clear liquids.  Medicine  You may be given medicines to lower anxiety.  Ask your health care provider about: ? Changing or stopping your regular medicines. This is especially important if you are taking diabetes medicines or blood thinners. ? Taking medicines such as aspirin and ibuprofen. These medicines can thin your blood. Do not take these medicines before your procedure if your health care provider instructs you not to. General instructions  Plan to have someone take you home from the hospital or clinic. What happens during the procedure?  You may receive a medicine to help you relax (sedative).  You will be asked to lie on your abdomen.  The injection site will be  cleaned.  A numbing medicine (local anesthetic) will be used to numb the injection site.  A needle will be inserted through your skin into the epidural space. You may feel some discomfort when this happens. An X-ray machine will be used to make sure the needle is put as close as possible to the affected nerve.  A steroid medicine and a local anesthetic will be injected into the epidural space.  The needle will be removed.  A bandage (dressing) will be put over the injection site. What happens after the procedure?  Your blood pressure, heart rate, breathing rate, and blood oxygen level will be monitored until the medicines you were given have worn off.  Your arm or leg may feel weak or numb for a few hours.  The injection site may feel sore.  Do not drive for 24 hours if you received a sedative. This information is not intended to replace advice given to you by your health care provider. Make sure you discuss any questions you have with your health care provider. Document Released: 06/14/2007 Document Revised: 08/19/2015 Document Reviewed: 06/23/2015 [employer] Interactive Patient Education  2017 [employer].

## 2016-09-28 ENCOUNTER — Ambulatory Visit (HOSPITAL_BASED_OUTPATIENT_CLINIC_OR_DEPARTMENT_OTHER): Payer: Medicare Other | Admitting: Pain Medicine

## 2016-09-28 ENCOUNTER — Ambulatory Visit
Admission: RE | Admit: 2016-09-28 | Discharge: 2016-09-28 | Disposition: A | Discharge status: Home / Self Care | Payer: Medicare Other | Source: Ambulatory Visit | Attending: Pain Medicine | Admitting: Pain Medicine

## 2016-09-28 ENCOUNTER — Encounter: Payer: Self-pay | Admitting: Pain Medicine

## 2016-09-28 VITALS — BP 137/78 | HR 62 | Temp 97.0°F | Resp 17 | Ht 66.0 in | Wt 140.0 lb

## 2016-09-28 DIAGNOSIS — M4726 Other spondylosis with radiculopathy, lumbar region: Secondary | ICD-10-CM | POA: Insufficient documentation

## 2016-09-28 DIAGNOSIS — M5441 Lumbago with sciatica, right side: Secondary | ICD-10-CM

## 2016-09-28 DIAGNOSIS — M5416 Radiculopathy, lumbar region: Secondary | ICD-10-CM | POA: Diagnosis not present

## 2016-09-28 DIAGNOSIS — G8929 Other chronic pain: Secondary | ICD-10-CM | POA: Diagnosis not present

## 2016-09-28 DIAGNOSIS — M48062 Spinal stenosis, lumbar region with neurogenic claudication: Secondary | ICD-10-CM | POA: Insufficient documentation

## 2016-09-28 DIAGNOSIS — M9983 Other biomechanical lesions of lumbar region: Secondary | ICD-10-CM

## 2016-09-28 DIAGNOSIS — M5442 Lumbago with sciatica, left side: Secondary | ICD-10-CM

## 2016-09-28 DIAGNOSIS — M48061 Spinal stenosis, lumbar region without neurogenic claudication: Secondary | ICD-10-CM

## 2016-09-28 MED ORDER — DEXAMETHASONE SODIUM PHOSPHATE 10 MG/ML IJ SOLN
10.0000 mg | Freq: Once | INTRAMUSCULAR | Status: AC
  Administered 2016-09-28: 10 mg

## 2016-09-28 MED ORDER — SODIUM CHLORIDE 0.9% FLUSH
1.0000 mL | Freq: Once | INTRAVENOUS | Status: AC
  Administered 2016-09-28: 1 mL

## 2016-09-28 MED ORDER — DEXAMETHASONE SODIUM PHOSPHATE 10 MG/ML IJ SOLN
INTRAMUSCULAR | Status: AC
  Filled 2016-09-28: qty 2

## 2016-09-28 MED ORDER — FENTANYL CITRATE (PF) 100 MCG/2ML IJ SOLN
25.0000 ug | INTRAMUSCULAR | Status: DC | PRN
  Administered 2016-09-28: 50 ug via INTRAVENOUS

## 2016-09-28 MED ORDER — FENTANYL CITRATE (PF) 100 MCG/2ML IJ SOLN
INTRAMUSCULAR | Status: AC
  Filled 2016-09-28: qty 2

## 2016-09-28 MED ORDER — LACTATED RINGERS IV SOLN
1000.0000 mL | Freq: Once | INTRAVENOUS | Status: AC
  Administered 2016-09-28: 1000 mL via INTRAVENOUS

## 2016-09-28 MED ORDER — IOPAMIDOL (ISOVUE-M 200) INJECTION 41%
INTRAMUSCULAR | Status: AC
  Filled 2016-09-28: qty 10

## 2016-09-28 MED ORDER — SODIUM CHLORIDE 0.9% FLUSH
2.0000 mL | Freq: Once | INTRAVENOUS | Status: AC
  Administered 2016-09-28: 2 mL

## 2016-09-28 MED ORDER — TRIAMCINOLONE ACETONIDE 40 MG/ML IJ SUSP
INTRAMUSCULAR | Status: AC
Start: 2016-09-28 — End: ?
  Filled 2016-09-28: qty 1

## 2016-09-28 MED ORDER — MIDAZOLAM HCL 5 MG/5ML IJ SOLN
INTRAMUSCULAR | Status: AC
  Filled 2016-09-28: qty 5

## 2016-09-28 MED ORDER — IOPAMIDOL (ISOVUE-M 200) INJECTION 41%
10.0000 mL | Freq: Once | INTRAMUSCULAR | Status: AC
  Administered 2016-09-28: 10 mL via EPIDURAL

## 2016-09-28 MED ORDER — ROPIVACAINE HCL 2 MG/ML IJ SOLN
2.0000 mL | Freq: Once | INTRAMUSCULAR | Status: AC
  Administered 2016-09-28: 2 mL via EPIDURAL

## 2016-09-28 MED ORDER — ROPIVACAINE HCL 2 MG/ML IJ SOLN
1.0000 mL | Freq: Once | INTRAMUSCULAR | Status: AC
  Administered 2016-09-28: 9 mL via EPIDURAL

## 2016-09-28 MED ORDER — LIDOCAINE HCL (PF) 1.5 % IJ SOLN
20.0000 mL | Freq: Once | INTRAMUSCULAR | Status: DC
  Filled 2016-09-28: qty 20

## 2016-09-28 MED ORDER — LIDOCAINE HCL (PF) 1 % IJ SOLN
5.0000 mL | Freq: Once | INTRAMUSCULAR | Status: AC
  Administered 2016-09-28: 1 mL

## 2016-09-28 MED ORDER — ROPIVACAINE HCL 2 MG/ML IJ SOLN
INTRAMUSCULAR | Status: AC
  Filled 2016-09-28: qty 10

## 2016-09-28 MED ORDER — MIDAZOLAM HCL 5 MG/5ML IJ SOLN
1.0000 mg | INTRAMUSCULAR | Status: DC | PRN
  Administered 2016-09-28: 1 mg via INTRAVENOUS

## 2016-09-28 MED ORDER — TRIAMCINOLONE ACETONIDE 40 MG/ML IJ SUSP
40.0000 mg | Freq: Once | INTRAMUSCULAR | Status: AC
  Administered 2016-09-28: 40 mg

## 2016-09-28 MED ORDER — LIDOCAINE HCL (PF) 1 % IJ SOLN
INTRAMUSCULAR | Status: AC
  Filled 2016-09-28: qty 5

## 2016-09-28 MED ORDER — SODIUM CHLORIDE 0.9 % IJ SOLN
INTRAMUSCULAR | Status: AC
  Filled 2016-09-28: qty 20

## 2016-09-28 MED ORDER — ROPIVACAINE HCL 2 MG/ML IJ SOLN
1.0000 mL | Freq: Once | INTRAMUSCULAR | Status: AC
  Administered 2016-09-28: 1 mL via EPIDURAL

## 2016-09-28 NOTE — Progress Notes (Signed)
Safety precautions to be maintained throughout the outpatient stay will include: orient to surroundings, keep bed in low position, maintain call bell within reach at all times, provide assistance with transfer out of bed and ambulation.  

## 2016-09-28 NOTE — Progress Notes (Signed)
Patient's Name: Kristi [name]  MRN: [id number]  Referring Provider: Lorelee Market, MD  DOB: 1940-09-04  PCP: Lorelee Market, MD  DOS: 09/28/2016  Note by: Gaspar Cola, MD  Service setting: Ambulatory outpatient  Specialty: Interventional Pain Management  Patient type: Established  Location: [facility] (AMB) Pain Management Facility  Visit type: Interventional Procedure   Primary Reason for Visit: Interventional Pain Management Treatment. CC: Back Pain (lower)  Procedure:  Anesthesia, Analgesia, Anxiolysis:  Procedure #1: Type: Palliative Inter-Laminar Epidural Steroid Injection Region: Lumbar Level: L2-3 Level. Laterality: Right-Sided Paramedial  Procedure #2: Type: Palliative Trans-Foraminal Epidural Steroid Injection Region: Lumbar Level: L3-4 Paravertebral Laterality: Bilateral Paravertebral Position: Prone  Type: Local Anesthesia with Moderate (Conscious) Sedation Local Anesthetic: Lidocaine 1% Route: Intravenous (IV) IV Access: Secured Sedation: Meaningful verbal contact was maintained at all times during the procedure  Indication(s): Analgesia and Anxiety  Indications: 1. Lumbar radiculopathy   2. 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 Chronic low back pain (Location of Secondary source of pain) (Bilateral) (R>L)   5. Lumbar foraminal stenosis (multilevel)    Pain Score: Pre-procedure: 3 /10 Post-procedure: 0-No pain/10  Pre-op Assessment:  Previous date of service: 09/08/16 Service provided: Med Refill Kristi [name] is a 76 y.o. (year old), female patient, seen today for interventional treatment. She  has a past surgical history that includes Abdominal hysterectomy; Coronary stent placement; Coronary artery bypass graft; and s/p pacer insertion. Kristi [name] has a current medication list which includes the following prescription(s): cholecalciferol, docusate sodium, fluticasone, furosemide, gabapentin, lamotrigine,  levothyroxine, linaclotide, loratadine, multiple vitamins-minerals, omeprazole, potassium chloride sa, rosuvastatin, sertraline, thiothixene, trazodone, and vitamin b-12, and the following Facility-Administered Medications: fentanyl, lidocaine, and midazolam. Her primarily concern today is the Back Pain (lower)  Initial Vital Signs: Blood pressure 115/78, pulse 84, temperature 98.4 F (36.9 C), temperature source Oral, resp. rate 14, height 5\' 6"  (1.676 m), weight 140 lb (63.5 kg), SpO2 98 %. BMI: 22.60 kg/m  Risk Assessment: Allergies: Reviewed. She is allergic to penicillin g.  Allergy Precautions: None required Coagulopathies: Reviewed. None identified.  Blood-thinner therapy: None at this time Active Infection(s): Reviewed. None identified. Kristi [name] is afebrile  Site Confirmation: Kristi [name] was asked to confirm the procedure and laterality before marking the site Procedure checklist: Completed Consent: Before the procedure and under the influence of no sedative(s), amnesic(s), or anxiolytics, the patient was informed of the treatment options, risks and possible complications. To fulfill our ethical and legal obligations, as recommended by the American Medical Association's Code of Ethics, I have informed the patient of my clinical impression; the nature and purpose of the treatment or procedure; the risks, benefits, and possible complications of the intervention; the alternatives, including doing nothing; the risk(s) and benefit(s) of the alternative treatment(s) or procedure(s); and the risk(s) and benefit(s) of doing nothing. The patient was provided information about the general risks and possible complications associated with the procedure. These may include, but are not limited to: failure to achieve desired goals, infection, bleeding, organ or nerve damage, allergic reactions, paralysis, and death. In addition, the patient was informed of those risks and complications associated to  Spine-related procedures, such as failure to decrease pain; infection (i.e.: Meningitis, epidural or intraspinal abscess); bleeding (i.e.: epidural hematoma, subarachnoid hemorrhage, or any other type of intraspinal or peri-dural bleeding); organ or nerve damage (i.e.: Any type of peripheral nerve, nerve root, or spinal cord injury) with subsequent damage to sensory, motor, and/or autonomic  systems, resulting in permanent pain, numbness, and/or weakness of one or several areas of the body; allergic reactions; (i.e.: anaphylactic reaction); and/or death. Furthermore, the patient was informed of those risks and complications associated with the medications. These include, but are not limited to: allergic reactions (i.e.: anaphylactic or anaphylactoid reaction(s)); adrenal axis suppression; blood sugar elevation that in diabetics may result in ketoacidosis or comma; water retention that in patients with history of congestive heart failure may result in shortness of breath, pulmonary edema, and decompensation with resultant heart failure; weight gain; swelling or edema; medication-induced neural toxicity; particulate matter embolism and blood vessel occlusion with resultant organ, and/or nervous system infarction; and/or aseptic necrosis of one or more joints. Finally, the patient was informed that Medicine is not an exact science; therefore, there is also the possibility of unforeseen or unpredictable risks and/or possible complications that may result in a catastrophic outcome. The patient indicated having understood very clearly. We have given the patient no guarantees and we have made no promises. Enough time was given to the patient to ask questions, all of which were answered to the patient's satisfaction. Kristi [name] has indicated that she wanted to continue with the procedure. Attestation: I, the ordering provider, attest that I have discussed with the patient the benefits, risks, side-effects, alternatives,  likelihood of achieving goals, and potential problems during recovery for the procedure that I have provided informed consent. Date: 09/28/2016; Time: 11:06 AM  Pre-Procedure Preparation:  Monitoring: As per clinic protocol. Respiration, ETCO2, SpO2, BP, heart rate and rhythm monitor placed and checked for adequate function Safety Precautions: Patient was assessed for positional comfort and pressure points before starting the procedure. Time-out: I initiated and conducted the "Time-out" before starting the procedure, as per protocol. The patient was asked to participate by confirming the accuracy of the "Time Out" information. Verification of the correct person, site, and procedure were performed and confirmed by me, the nursing staff, and the patient. "Time-out" conducted as per Joint Commission's Universal Protocol (UP.01.01.01). "Time-out" Date & Time: 09/28/2016; 1144 hrs.  Description of Procedure #1 Process:   Target Area: The  interlaminar space, initially targeting the lower border of the superior vertebral body lamina. Approach: Posterior paramedial approach. Area Prepped: Entire Posterior Lumbosacral Region Prepping solution: ChloraPrep (2% chlorhexidine gluconate and 70% isopropyl alcohol) Safety Precautions: Aspiration looking for blood return was conducted prior to all injections. At no point did we inject any substances, as a needle was being advanced. No attempts were made at seeking any paresthesias. Safe injection practices and needle disposal techniques used. Medications properly checked for expiration dates. SDV (single dose vial) medications used. Description of the Procedure: Protocol guidelines were followed. The patient was placed in position over the fluoroscopy table. The target area was identified and the area prepped in the usual manner. Skin desensitized using vapocoolant spray. Skin & deeper tissues infiltrated with local anesthetic. Appropriate amount of time allowed to  pass for local anesthetics to take effect. The procedure needle was introduced through the skin, ipsilateral to the reported pain, and advanced to the target area. Bone was contacted and the needle walked caudad, until the lamina was cleared. The ligamentum flavum was engaged and loss-of-resistance technique used as the epidural needle was advanced. The epidural space was identified using "loss-of-resistance technique" with 2-3 ml of PF-NaCl (0.9% NSS), in a 5cc LOR glass syringe. Proper needle placement secured. Negative aspiration confirmed. Solution injected in intermittent fashion, asking for systemic symptoms every 0.5cc of injectate. The needles were  then removed and the area cleansed, making sure to leave some of the prepping solution back to take advantage of its long term bactericidal properties. Start Time: 1144 hrs. Materials:  Needle(s) Type: Epidural needle Gauge: 17G Length: 3.5-in Medication(s): We administered lactated ringers, midazolam, fentaNYL, iopamidol, dexamethasone, ropivacaine (PF) 2 mg/mL (0.2%), sodium chloride flush, dexamethasone, ropivacaine (PF) 2 mg/mL (0.2%), sodium chloride flush, triamcinolone acetonide, ropivacaine (PF) 2 mg/mL (0.2%), sodium chloride flush, and lidocaine (PF). Please see chart orders for dosing details.  Description of Procedure #2 Process:   Target Area: The inferior and lateral portion of the pedicle, just lateral to a line created by the 6:00 position of the pedicle and the superior articular process of the vertebral body below. On the lateral view, this target lies just posterior to the anterior aspect of the lamina and posterior to the midpoint created between the anterior and the posterior aspect of the neural foramina. Approach: Posterior paravertebral approach. Area Prepped: Same as above Prepping solution: Same as above Safety Precautions: Same as above Description of the Procedure: Protocol guidelines were followed. The patient was placed  in position over the fluoroscopy table. The target area was identified and the area prepped in the usual manner. Skin desensitized using vapocoolant spray. Skin & deeper tissues infiltrated with local anesthetic. Appropriate amount of time allowed to pass for local anesthetics to take effect. The procedure needles were then advanced to the target area. Proper needle placement secured. Negative aspiration confirmed. Solution injected in intermittent fashion, asking for systemic symptoms every 0.2cc of injectate. The needles were then removed and the area cleansed, making sure to leave some of the prepping solution back to take advantage of its long term bactericidal properties. Vitals:   09/28/16 1208 09/28/16 1213 09/28/16 1223 09/28/16 1233  BP: 121/71  130/80 137/78  Pulse: 64 68 65 62  Resp: 16 16 18 17   Temp:  (!) 97 F (36.1 C)    TempSrc:      SpO2: 99% 99% 98% 98%  Weight:      Height:        End Time: 1205 hrs. Materials:  Needle(s) Type: Regular needle Gauge: 22G Length: 3.5-in Medication(s): We administered lactated ringers, midazolam, fentaNYL, iopamidol, dexamethasone, ropivacaine (PF) 2 mg/mL (0.2%), sodium chloride flush, dexamethasone, ropivacaine (PF) 2 mg/mL (0.2%), sodium chloride flush, triamcinolone acetonide, ropivacaine (PF) 2 mg/mL (0.2%), sodium chloride flush, and lidocaine (PF). Please see chart orders for dosing details.  Imaging Guidance (Spinal):  Type of Imaging Technique: Fluoroscopy Guidance (Spinal) Indication(s): Assistance in needle guidance and placement for procedures requiring needle placement in or near specific anatomical locations not easily accessible without such assistance. Exposure Time: Please see nurses notes. Contrast: Before injecting any contrast, we confirmed that the patient did not have an allergy to iodine, shellfish, or radiological contrast. Once satisfactory needle placement was completed at the desired level, radiological contrast was  injected. Contrast injected under live fluoroscopy. No contrast complications. See chart for type and volume of contrast used. Fluoroscopic Guidance: I was personally present during the use of fluoroscopy. "Tunnel Vision Technique" used to obtain the best possible view of the target area. Parallax error corrected before commencing the procedure. "Direction-depth-direction" technique used to introduce the needle under continuous pulsed fluoroscopy. Once target was reached, antero-posterior, oblique, and lateral fluoroscopic projection used confirm needle placement in all planes. Images permanently stored in EMR. Interpretation: I personally interpreted the imaging intraoperatively. Adequate needle placement confirmed in multiple planes. Appropriate spread of contrast into desired  area was observed. No evidence of afferent or efferent intravascular uptake. No intrathecal or subarachnoid spread observed. Permanent images saved into the patient's record.  Antibiotic Prophylaxis:  Indication(s): None identified Antibiotic given: None  Post-operative Assessment:  EBL: None Complications: No immediate post-treatment complications observed by team, or reported by patient. Note: The patient tolerated the entire procedure well. A repeat set of vitals were taken after the procedure and the patient was kept under observation following institutional policy, for this type of procedure. Post-procedural neurological assessment was performed, showing return to baseline, prior to discharge. The patient was provided with post-procedure discharge instructions, including a section on how to identify potential problems. Should any problems arise concerning this procedure, the patient was given instructions to immediately contact us, at any time, without hesitation. In any case, we plan to contact the patient by telephone for a follow-up status report regarding this interventional procedure. Comments:  No additional relevant  information.  Plan of Care  Disposition: Discharge home  Discharge Date & Time: 09/28/2016; 1235 hrs.  Physician-requested Follow-up:  Return for post-procedure eval (in 2 wks), by MD.  Future Appointments Date Time Provider [place]  10/19/2016 9:15 AM Milinda Pointer, MD [facility] None  11/09/2016 9:00 AM Vevelyn Francois, NP [facility] None   Medications ordered for procedure: Meds ordered this encounter  Medications  . lactated ringers infusion 1,000 mL  . midazolam (VERSED) 5 MG/5ML injection 1-2 mg    Make sure Flumazenil is available in the pyxis when using this medication. If oversedation occurs, administer 0.2 mg IV over 15 sec. If after 45 sec no response, administer 0.2 mg again over 1 min; may repeat at 1 min intervals; not to exceed 4 doses (1 mg)  . fentaNYL (SUBLIMAZE) injection 25-50 mcg    Make sure Narcan is available in the pyxis when using this medication. In the event of respiratory depression (RR< 8/min): Titrate NARCAN (naloxone) in increments of 0.1 to 0.2 mg IV at 2-3 minute intervals, until desired degree of reversal.  . lidocaine 1.5 % injection 20 mL    This order is for documentation purposes only. Lidocaine w/ preservative included in block trays.  . iopamidol (ISOVUE-M) 41 % intrathecal injection 10 mL  . dexamethasone (DECADRON) injection 10 mg  . ropivacaine (PF) 2 mg/mL (0.2%) (NAROPIN) injection 1 mL  . sodium chloride flush (NS) 0.9 % injection 1 mL  . dexamethasone (DECADRON) injection 10 mg  . ropivacaine (PF) 2 mg/mL (0.2%) (NAROPIN) injection 1 mL  . sodium chloride flush (NS) 0.9 % injection 1 mL  . triamcinolone acetonide (KENALOG-40) injection 40 mg  . ropivacaine (PF) 2 mg/mL (0.2%) (NAROPIN) injection 2 mL  . sodium chloride flush (NS) 0.9 % injection 2 mL  . lidocaine (PF) (XYLOCAINE) 1 % injection 5 mL   Medications administered: We administered lactated ringers, midazolam, fentaNYL, iopamidol, dexamethasone, ropivacaine (PF)  2 mg/mL (0.2%), sodium chloride flush, dexamethasone, ropivacaine (PF) 2 mg/mL (0.2%), sodium chloride flush, triamcinolone acetonide, ropivacaine (PF) 2 mg/mL (0.2%), sodium chloride flush, and lidocaine (PF).  See the medical record for exact dosing, route, and time of administration.  Lab-work, Procedure(s), & Referral(s) Ordered: Orders Placed This Encounter  Procedures  . Lumbar Epidural Injection  . Lumbar Transforaminal Epidural  . DG C-Arm 1-60 Min-No Report  . Informed Consent Details: Transcribe to consent form and obtain patient signature  . Provider attestation of informed consent for procedure/surgical case  . Verify informed consent  . Discharge instructions  . Follow-up  Imaging Ordered: Results for orders placed in visit on 08/03/16  DG C-Arm 1-60 Min-No Report   Narrative Fluoroscopy was utilized by the requesting physician.  No radiographic  interpretation.    New Prescriptions   No medications on file   Primary Care Physician: Lorelee Market, MD Location: [facility] Note by: Gaspar Cola, MD Date: 09/28/2016; Time: 6:00 PM  Disclaimer:  Medicine is not an Chief Strategy Officer. The only guarantee in medicine is that nothing is guaranteed. It is important to note that the decision to proceed with this intervention was based on the information collected from the patient. The Data and conclusions were drawn from the patient's questionnaire, the interview, and the physical examination. Because the information was provided in large part by the patient, it cannot be guaranteed that it has not been purposely or unconsciously manipulated. Every effort has been made to obtain as much relevant data as possible for this evaluation. It is important to note that the conclusions that lead to this procedure are derived in large part from the available data. Always take into account that the treatment will also be dependent on availability of resources  and existing treatment guidelines, considered by other Pain Management Practitioners as being common knowledge and practice, at the time of the intervention. For Medico-Legal purposes, it is also important to point out that variation in procedural techniques and pharmacological choices are the acceptable norm. The indications, contraindications, technique, and results of the above procedure should only be interpreted and judged by a Board-Certified Interventional Pain Specialist with extensive familiarity and expertise in the same exact procedure and technique.  Instructions provided at this appointment: Patient Instructions   ____________________________________________________________________________________________  Post-Procedure instructions Instructions:  Apply ice: Fill a plastic sandwich bag with crushed ice. Cover it with a small towel and apply to injection site. Apply for 15 minutes then remove x 15 minutes. Repeat sequence on day of procedure, until you go to bed. The purpose is to minimize swelling and discomfort after procedure.  Apply heat: Apply heat to procedure site starting the day following the procedure. The purpose is to treat any soreness and discomfort from the procedure.  Food intake: Start with clear liquids (like water) and advance to regular food, as tolerated.   Physical activities: Keep activities to a minimum for the first 8 hours after the procedure.   Driving: If you have received any sedation, you are not allowed to drive for 24 hours after your procedure.  Blood thinner: Restart your blood thinner 6 hours after your procedure. (Only for those taking blood thinners)  Insulin: As soon as you can eat, you may resume your normal dosing schedule. (Only for those taking insulin)  Infection prevention: Keep procedure site clean and dry.  Post-procedure Pain Diary: Extremely important that this be done correctly and accurately. Recorded information will be used to  determine the next step in treatment.  Pain evaluated is that of treated area only. Do not include pain from an untreated area.  Complete every hour, on the hour, for the initial 8 hours. Set an alarm to help you do this part accurately.  Do not go to sleep and have it completed later. It will not be accurate.  Follow-up appointment: Keep your follow-up appointment after the procedure. Usually 2 weeks for most procedures. (6 weeks in the case of radiofrequency.) Bring you pain diary.  Expect:  From numbing medicine (AKA: Local Anesthetics): Numbness or decrease in pain.  Onset: Full effect  within 15 minutes of injected.  Duration: It will depend on the type of local anesthetic used. On the average, 1 to 8 hours.   From steroids: Decrease in swelling or inflammation. Once inflammation is improved, relief of the pain will follow.  Onset of benefits: Depends on the amount of swelling present. The more swelling, the longer it will take for the benefits to be seen. In some cases, up to 10 days.  Duration: Steroids will stay in the system x 2 weeks. Duration of benefits will depend on multiple posibilities including persistent irritating factors.  From procedure: Some discomfort is to be expected once the numbing medicine wears off. This should be minimal if ice and heat are applied as instructed. Call if:  You experience numbness and weakness that gets worse with time, as opposed to wearing off.  New onset bowel or bladder incontinence. (Spinal procedures only)  Emergency Numbers:  Durning business hours (Monday - Thursday, 8:00 AM - 4:00 PM) (Friday, 9:00 AM - 12:00 Noon): [phone]  After hours: [phone] ____________________________________________________________________________________________  Pain Management Discharge Instructions  General Discharge Instructions :  If you need to reach your doctor call: Monday-Friday 8:00 am - 4:00 pm at [phone] or toll free  [phone].  After clinic hours [phone] to have operator reach doctor.  Bring all of your medication bottles to all your appointments in the pain clinic.  To cancel or reschedule your appointment with Pain Management please remember to call 24 hours in advance to avoid a fee.  Refer to the educational materials which you have been given on: General Risks, I had my Procedure. Discharge Instructions, Post Sedation.  Post Procedure Instructions:  The drugs you were given will stay in your system until tomorrow, so for the next 24 hours you should not drive, make any legal decisions or drink any alcoholic beverages.  You may eat anything you prefer, but it is better to start with liquids then soups and crackers, and gradually work up to solid foods.  Please notify your doctor immediately if you have any unusual bleeding, trouble breathing or pain that is not related to your normal pain.  Depending on the type of procedure that was done, some parts of your body may feel week and/or numb.  This usually clears up by tonight or the next day.  Walk with the use of an assistive device or accompanied by an adult for the 24 hours.  You may use ice on the affected area for the first 24 hours.  Put ice in a Ziploc bag and cover with a towel and place against area 15 minutes on 15 minutes off.  You may switch to heat after 24 hours.GENERAL RISKS AND COMPLICATIONS  What are the risk, side effects and possible complications? Generally speaking, most procedures are safe.  However, with any procedure there are risks, side effects, and the possibility of complications.  The risks and complications are dependent upon the sites that are lesioned, or the type of nerve block to be performed.  The closer the procedure is to the spine, the more serious the risks are.  Great care is taken when placing the radio frequency needles, block needles or lesioning probes, but sometimes complications can  occur. 1. Infection: Any time there is an injection through the skin, there is a risk of infection.  This is why sterile conditions are used for these blocks.  There are four possible types of infection. 1. Localized skin infection. 2. Central Nervous System  Infection-This can be in the form of Meningitis, which can be deadly. 3. Epidural Infections-This can be in the form of an epidural abscess, which can cause pressure inside of the spine, causing compression of the spinal cord with subsequent paralysis. This would require an emergency surgery to decompress, and there are no guarantees that the patient would recover from the paralysis. 4. Discitis-This is an infection of the intervertebral discs.  It occurs in about 1% of discography procedures.  It is difficult to treat and it may lead to surgery.        2. Pain: the needles have to go through skin and soft tissues, will cause soreness.       3. Damage to internal structures:  The nerves to be lesioned may be near blood vessels or    other nerves which can be potentially damaged.       4. Bleeding: Bleeding is more common if the patient is taking blood thinners such as  aspirin, Coumadin, Ticiid, Plavix, etc., or if he/she have some genetic predisposition  such as hemophilia. Bleeding into the spinal canal can cause compression of the spinal  cord with subsequent paralysis.  This would require an emergency surgery to  decompress and there are no guarantees that the patient would recover from the  paralysis.       5. Pneumothorax:  Puncturing of a lung is a possibility, every time a needle is introduced in  the area of the chest or upper back.  Pneumothorax refers to free air around the  collapsed lung(s), inside of the thoracic cavity (chest cavity).  Another two possible  complications related to a similar event would include: Hemothorax and Chylothorax.   These are variations of the Pneumothorax, where instead of air around the collapsed  lung(s),  you may have blood or chyle, respectively.       6. Spinal headaches: They may occur with any procedures in the area of the spine.       7. Persistent CSF (Cerebro-Spinal Fluid) leakage: This is a rare problem, but may occur  with prolonged intrathecal or epidural catheters either due to the formation of a fistulous  track or a dural tear.       8. Nerve damage: By working so close to the spinal cord, there is always a possibility of  nerve damage, which could be as serious as a permanent spinal cord injury with  paralysis.       9. Death:  Although rare, severe deadly allergic reactions known as "Anaphylactic  reaction" can occur to any of the medications used.      10. Worsening of the symptoms:  We can always make thing worse.  What are the chances of something like this happening? Chances of any of this occuring are extremely low.  By statistics, you have more of a chance of getting killed in a motor vehicle accident: while driving to the hospital than any of the above occurring .  Nevertheless, you should be aware that they are possibilities.  In general, it is similar to taking a shower.  Everybody knows that you can slip, hit your head and get killed.  Does that mean that you should not shower again?  Nevertheless always keep in mind that statistics do not mean anything if you happen to be on the wrong side of them.  Even if a procedure has a 1 (one) in a 1,000,000 (million) chance of going wrong, it you happen to be that  one..Also, keep in mind that by statistics, you have more of a chance of having something go wrong when taking medications.  Who should not have this procedure? If you are on a blood thinning medication (e.g. Coumadin, Plavix, see list of "Blood Thinners"), or if you have an active infection going on, you should not have the procedure.  If you are taking any blood thinners, please inform your physician.  How should I prepare for this procedure?  Do not eat or drink anything at  least six hours prior to the procedure.  Bring a driver with you .  It cannot be a taxi.  Come accompanied by an adult that can drive you back, and that is strong enough to help you if your legs get weak or numb from the local anesthetic.  Take all of your medicines the morning of the procedure with just enough water to swallow them.  If you have diabetes, make sure that you are scheduled to have your procedure done first thing in the morning, whenever possible.  If you have diabetes, take only half of your insulin dose and notify our nurse that you have done so as soon as you arrive at the clinic.  If you are diabetic, but only take blood sugar pills (oral hypoglycemic), then do not take them on the morning of your procedure.  You may take them after you have had the procedure.  Do not take aspirin or any aspirin-containing medications, at least eleven (11) days prior to the procedure.  They may prolong bleeding.  Wear loose fitting clothing that may be easy to take off and that you would not mind if it got stained with Betadine or blood.  Do not wear any jewelry or perfume  Remove any nail coloring.  It will interfere with some of our monitoring equipment.  NOTE: Remember that this is not meant to be interpreted as a complete list of all possible complications.  Unforeseen problems may occur.  BLOOD THINNERS The following drugs contain aspirin or other products, which can cause increased bleeding during surgery and should not be taken for 2 weeks prior to and 1 week after surgery.  If you should need take something for relief of minor pain, you may take acetaminophen which is found in Tylenol,m Datril, Anacin-3 and Panadol. It is not blood thinner. The products listed below are.  Do not take any of the products listed below in addition to any listed on your instruction sheet.  A.P.C or A.P.C with Codeine Codeine Phosphate Capsules #3 Ibuprofen Ridaura  ABC compound Congesprin Imuran  rimadil  Advil Cope Indocin Robaxisal  Alka-Seltzer Effervescent Pain Reliever and Antacid Coricidin or Coricidin-D  Indomethacin Rufen  Alka-Seltzer plus Cold Medicine Cosprin Ketoprofen S-A-C Tablets  Anacin Analgesic Tablets or Capsules Coumadin Korlgesic Salflex  Anacin Extra Strength Analgesic tablets or capsules CP-2 Tablets Lanoril Salicylate  Anaprox Cuprimine Capsules Levenox Salocol  Anexsia-D Dalteparin Magan Salsalate  Anodynos Darvon compound Magnesium Salicylate Sine-off  Ansaid Dasin Capsules Magsal Sodium Salicylate  Anturane Depen Capsules Marnal Soma  APF Arthritis pain formula Dewitt's Pills Measurin Stanback  Argesic Dia-Gesic Meclofenamic Sulfinpyrazone  Arthritis Bayer Timed Release Aspirin Diclofenac Meclomen Sulindac  Arthritis pain formula Anacin Dicumarol Medipren Supac  Analgesic (Safety coated) Arthralgen Diffunasal Mefanamic Suprofen  Arthritis Strength Bufferin Dihydrocodeine Mepro Compound Suprol  Arthropan liquid Dopirydamole Methcarbomol with Aspirin Synalgos  ASA tablets/Enseals Disalcid Micrainin Tagament  Ascriptin Doan's Midol Talwin  Ascriptin A/D Dolene Mobidin Tanderil  Ascriptin Extra Strength  Dolobid Moblgesic Ticlid  Ascriptin with Codeine Doloprin or Doloprin with Codeine Momentum Tolectin  Asperbuf Duoprin Mono-gesic Trendar  Aspergum Duradyne Motrin or Motrin IB Triminicin  Aspirin plain, buffered or enteric coated Durasal Myochrisine Trigesic  Aspirin Suppositories Easprin Nalfon Trillsate  Aspirin with Codeine Ecotrin Regular or Extra Strength Naprosyn Uracel  Atromid-S Efficin Naproxen Ursinus  Auranofin Capsules Elmiron Neocylate Vanquish  Axotal Emagrin Norgesic Verin  Azathioprine Empirin or Empirin with Codeine Normiflo Vitamin E  Azolid Emprazil Nuprin Voltaren  Bayer Aspirin plain, buffered or children's or timed BC Tablets or powders Encaprin Orgaran Warfarin Sodium  Buff-a-Comp Enoxaparin Orudis Zorpin  Buff-a-Comp with  Codeine Equegesic Os-Cal-Gesic   Buffaprin Excedrin plain, buffered or Extra Strength Oxalid   Bufferin Arthritis Strength Feldene Oxphenbutazone   Bufferin plain or Extra Strength Feldene Capsules Oxycodone with Aspirin   Bufferin with Codeine Fenoprofen Fenoprofen Pabalate or Pabalate-SF   Buffets II Flogesic Panagesic   Buffinol plain or Extra Strength Florinal or Florinal with Codeine Panwarfarin   Buf-Tabs Flurbiprofen Penicillamine   Butalbital Compound Four-way cold tablets Penicillin   Butazolidin Fragmin Pepto-Bismol   Carbenicillin Geminisyn Percodan   Carna Arthritis Reliever Geopen Persantine   Carprofen Gold's salt Persistin   Chloramphenicol Goody's Phenylbutazone   Chloromycetin Haltrain Piroxlcam   Clmetidine heparin Plaquenil   Cllnoril Hyco-pap Ponstel   Clofibrate Hydroxy chloroquine Propoxyphen         Before stopping any of these medications, be sure to consult the physician who ordered them.  Some, such as Coumadin (Warfarin) are ordered to prevent or treat serious conditions such as "deep thrombosis", "pumonary embolisms", and other heart problems.  The amount of time that you may need off of the medication may also vary with the medication and the reason for which you were taking it.  If you are taking any of these medications, please make sure you notify your pain physician before you undergo any procedures.          Epidural Steroid Injection An epidural steroid injection is a shot of steroid medicine and numbing medicine that is given into the space between the spinal cord and the bones in your back (epidural space). The shot helps relieve pain caused by an irritated or swollen nerve root. The amount of pain relief you get from the injection depends on what is causing the nerve to be swollen and irritated, and how long your pain lasts. You are more likely to benefit from this injection if your pain is strong and comes on suddenly rather than if you have had  pain for a long time. Tell a health care provider about:  Any allergies you have.  All medicines you are taking, including vitamins, herbs, eye drops, creams, and over-the-counter medicines.  Any problems you or family members have had with anesthetic medicines.  Any blood disorders you have.  Any surgeries you have had.  Any medical conditions you have.  Whether you are pregnant or may be pregnant. What are the risks? Generally, this is a safe procedure. However, problems may occur, including:  Headache.  Bleeding.  Infection.  Allergic reaction to medicines.  Damage to your nerves.  What happens before the procedure? Staying hydrated Follow instructions from your health care provider about hydration, which may include:  Up to 2 hours before the procedure - you may continue to drink clear liquids, such as water, clear fruit juice, black coffee, and plain tea.  Eating and drinking restrictions Follow instructions from your health care provider  about eating and drinking, which may include:  8 hours before the procedure - stop eating heavy meals or foods such as meat, fried foods, or fatty foods.  6 hours before the procedure - stop eating light meals or foods, such as toast or cereal.  6 hours before the procedure - stop drinking milk or drinks that contain milk.  2 hours before the procedure - stop drinking clear liquids.  Medicine  You may be given medicines to lower anxiety.  Ask your health care provider about: ? Changing or stopping your regular medicines. This is especially important if you are taking diabetes medicines or blood thinners. ? Taking medicines such as aspirin and ibuprofen. These medicines can thin your blood. Do not take these medicines before your procedure if your health care provider instructs you not to. General instructions  Plan to have someone take you home from the hospital or clinic. What happens during the procedure?  You may  receive a medicine to help you relax (sedative).  You will be asked to lie on your abdomen.  The injection site will be cleaned.  A numbing medicine (local anesthetic) will be used to numb the injection site.  A needle will be inserted through your skin into the epidural space. You may feel some discomfort when this happens. An X-ray machine will be used to make sure the needle is put as close as possible to the affected nerve.  A steroid medicine and a local anesthetic will be injected into the epidural space.  The needle will be removed.  A bandage (dressing) will be put over the injection site. What happens after the procedure?  Your blood pressure, heart rate, breathing rate, and blood oxygen level will be monitored until the medicines you were given have worn off.  Your arm or leg may feel weak or numb for a few hours.  The injection site may feel sore.  Do not drive for 24 hours if you received a sedative. This information is not intended to replace advice given to you by your health care provider. Make sure you discuss any questions you have with your health care provider. Document Released: 06/14/2007 Document Revised: 08/19/2015 Document Reviewed: 06/23/2015 [employer] Interactive Patient Education  2017 [employer].

## 2016-09-28 NOTE — Patient Instructions (Addendum)
____________________________________________________________________________________________  Post-Procedure instructions Instructions:  Apply ice: Fill a plastic sandwich bag with crushed ice. Cover it with a small towel and apply to injection site. Apply for 15 minutes then remove x 15 minutes. Repeat sequence on day of procedure, until you go to bed. The purpose is to minimize swelling and discomfort after procedure.  Apply heat: Apply heat to procedure site starting the day following the procedure. The purpose is to treat any soreness and discomfort from the procedure.  Food intake: Start with clear liquids (like water) and advance to regular food, as tolerated.   Physical activities: Keep activities to a minimum for the first 8 hours after the procedure.   Driving: If you have received any sedation, you are not allowed to drive for 24 hours after your procedure.  Blood thinner: Restart your blood thinner 6 hours after your procedure. (Only for those taking blood thinners)  Insulin: As soon as you can eat, you may resume your normal dosing schedule. (Only for those taking insulin)  Infection prevention: Keep procedure site clean and dry.  Post-procedure Pain Diary: Extremely important that this be done correctly and accurately. Recorded information will be used to determine the next step in treatment.  Pain evaluated is that of treated area only. Do not include pain from an untreated area.  Complete every hour, on the hour, for the initial 8 hours. Set an alarm to help you do this part accurately.  Do not go to sleep and have it completed later. It will not be accurate.  Follow-up appointment: Keep your follow-up appointment after the procedure. Usually 2 weeks for most procedures. (6 weeks in the case of radiofrequency.) Bring you pain diary.  Expect:  From numbing medicine (AKA: Local Anesthetics): Numbness or decrease in pain.  Onset: Full effect within 15 minutes of  injected.  Duration: It will depend on the type of local anesthetic used. On the average, 1 to 8 hours.   From steroids: Decrease in swelling or inflammation. Once inflammation is improved, relief of the pain will follow.  Onset of benefits: Depends on the amount of swelling present. The more swelling, the longer it will take for the benefits to be seen. In some cases, up to 10 days.  Duration: Steroids will stay in the system x 2 weeks. Duration of benefits will depend on multiple posibilities including persistent irritating factors.  From procedure: Some discomfort is to be expected once the numbing medicine wears off. This should be minimal if ice and heat are applied as instructed. Call if:  You experience numbness and weakness that gets worse with time, as opposed to wearing off.  New onset bowel or bladder incontinence. (Spinal procedures only)  Emergency Numbers:  Durning business hours (Monday - Thursday, 8:00 AM - 4:00 PM) (Friday, 9:00 AM - 12:00 Noon): [phone]  After hours: [phone] ____________________________________________________________________________________________  Pain Management Discharge Instructions  General Discharge Instructions :  If you need to reach your doctor call: Monday-Friday 8:00 am - 4:00 pm at [phone] or toll free [phone].  After clinic hours [phone] to have operator reach doctor.  Bring all of your medication bottles to all your appointments in the pain clinic.  To cancel or reschedule your appointment with Pain Management please remember to call 24 hours in advance to avoid a fee.  Refer to the educational materials which you have been given on: General Risks, I had my Procedure. Discharge Instructions, Post Sedation.  Post Procedure Instructions:  The drugs you   were given will stay in your system until tomorrow, so for the next 24 hours you should not drive, make any legal decisions or drink any alcoholic  beverages.  You may eat anything you prefer, but it is better to start with liquids then soups and crackers, and gradually work up to solid foods.  Please notify your doctor immediately if you have any unusual bleeding, trouble breathing or pain that is not related to your normal pain.  Depending on the type of procedure that was done, some parts of your body may feel week and/or numb.  This usually clears up by tonight or the next day.  Walk with the use of an assistive device or accompanied by an adult for the 24 hours.  You may use ice on the affected area for the first 24 hours.  Put ice in a Ziploc bag and cover with a towel and place against area 15 minutes on 15 minutes off.  You may switch to heat after 24 hours.GENERAL RISKS AND COMPLICATIONS  What are the risk, side effects and possible complications? Generally speaking, most procedures are safe.  However, with any procedure there are risks, side effects, and the possibility of complications.  The risks and complications are dependent upon the sites that are lesioned, or the type of nerve block to be performed.  The closer the procedure is to the spine, the more serious the risks are.  Great care is taken when placing the radio frequency needles, block needles or lesioning probes, but sometimes complications can occur. 1. Infection: Any time there is an injection through the skin, there is a risk of infection.  This is why sterile conditions are used for these blocks.  There are four possible types of infection. 1. Localized skin infection. 2. Central Nervous System Infection-This can be in the form of Meningitis, which can be deadly. 3. Epidural Infections-This can be in the form of an epidural abscess, which can cause pressure inside of the spine, causing compression of the spinal cord with subsequent paralysis. This would require an emergency surgery to decompress, and there are no guarantees that the patient would recover from the  paralysis. 4. Discitis-This is an infection of the intervertebral discs.  It occurs in about 1% of discography procedures.  It is difficult to treat and it may lead to surgery.        2. Pain: the needles have to go through skin and soft tissues, will cause soreness.       3. Damage to internal structures:  The nerves to be lesioned may be near blood vessels or    other nerves which can be potentially damaged.       4. Bleeding: Bleeding is more common if the patient is taking blood thinners such as  aspirin, Coumadin, Ticiid, Plavix, etc., or if he/she have some genetic predisposition  such as hemophilia. Bleeding into the spinal canal can cause compression of the spinal  cord with subsequent paralysis.  This would require an emergency surgery to  decompress and there are no guarantees that the patient would recover from the  paralysis.       5. Pneumothorax:  Puncturing of a lung is a possibility, every time a needle is introduced in  the area of the chest or upper back.  Pneumothorax refers to free air around the  collapsed lung(s), inside of the thoracic cavity (chest cavity).  Another two possible  complications related to a similar event would include: Hemothorax and   Chylothorax.   These are variations of the Pneumothorax, where instead of air around the collapsed  lung(s), you may have blood or chyle, respectively.       6. Spinal headaches: They may occur with any procedures in the area of the spine.       7. Persistent CSF (Cerebro-Spinal Fluid) leakage: This is a rare problem, but may occur  with prolonged intrathecal or epidural catheters either due to the formation of a fistulous  track or a dural tear.       8. Nerve damage: By working so close to the spinal cord, there is always a possibility of  nerve damage, which could be as serious as a permanent spinal cord injury with  paralysis.       9. Death:  Although rare, severe deadly allergic reactions known as "Anaphylactic  reaction" can  occur to any of the medications used.      10. Worsening of the symptoms:  We can always make thing worse.  What are the chances of something like this happening? Chances of any of this occuring are extremely low.  By statistics, you have more of a chance of getting killed in a motor vehicle accident: while driving to the hospital than any of the above occurring .  Nevertheless, you should be aware that they are possibilities.  In general, it is similar to taking a shower.  Everybody knows that you can slip, hit your head and get killed.  Does that mean that you should not shower again?  Nevertheless always keep in mind that statistics do not mean anything if you happen to be on the wrong side of them.  Even if a procedure has a 1 (one) in a 1,000,000 (million) chance of going wrong, it you happen to be that one..Also, keep in mind that by statistics, you have more of a chance of having something go wrong when taking medications.  Who should not have this procedure? If you are on a blood thinning medication (e.g. Coumadin, Plavix, see list of "Blood Thinners"), or if you have an active infection going on, you should not have the procedure.  If you are taking any blood thinners, please inform your physician.  How should I prepare for this procedure?  Do not eat or drink anything at least six hours prior to the procedure.  Bring a driver with you .  It cannot be a taxi.  Come accompanied by an adult that can drive you back, and that is strong enough to help you if your legs get weak or numb from the local anesthetic.  Take all of your medicines the morning of the procedure with just enough water to swallow them.  If you have diabetes, make sure that you are scheduled to have your procedure done first thing in the morning, whenever possible.  If you have diabetes, take only half of your insulin dose and notify our nurse that you have done so as soon as you arrive at the clinic.  If you are  diabetic, but only take blood sugar pills (oral hypoglycemic), then do not take them on the morning of your procedure.  You may take them after you have had the procedure.  Do not take aspirin or any aspirin-containing medications, at least eleven (11) days prior to the procedure.  They may prolong bleeding.  Wear loose fitting clothing that may be easy to take off and that you would not mind if it got stained with Betadine   or blood.  Do not wear any jewelry or perfume  Remove any nail coloring.  It will interfere with some of our monitoring equipment.  NOTE: Remember that this is not meant to be interpreted as a complete list of all possible complications.  Unforeseen problems may occur.  BLOOD THINNERS The following drugs contain aspirin or other products, which can cause increased bleeding during surgery and should not be taken for 2 weeks prior to and 1 week after surgery.  If you should need take something for relief of minor pain, you may take acetaminophen which is found in Tylenol,m Datril, Anacin-3 and Panadol. It is not blood thinner. The products listed below are.  Do not take any of the products listed below in addition to any listed on your instruction sheet.  A.P.C or A.P.C with Codeine Codeine Phosphate Capsules #3 Ibuprofen Ridaura  ABC compound Congesprin Imuran rimadil  Advil Cope Indocin Robaxisal  Alka-Seltzer Effervescent Pain Reliever and Antacid Coricidin or Coricidin-D  Indomethacin Rufen  Alka-Seltzer plus Cold Medicine Cosprin Ketoprofen S-A-C Tablets  Anacin Analgesic Tablets or Capsules Coumadin Korlgesic Salflex  Anacin Extra Strength Analgesic tablets or capsules CP-2 Tablets Lanoril Salicylate  Anaprox Cuprimine Capsules Levenox Salocol  Anexsia-D Dalteparin Magan Salsalate  Anodynos Darvon compound Magnesium Salicylate Sine-off  Ansaid Dasin Capsules Magsal Sodium Salicylate  Anturane Depen Capsules Marnal Soma  APF Arthritis pain formula Dewitt's Pills  Measurin Stanback  Argesic Dia-Gesic Meclofenamic Sulfinpyrazone  Arthritis Bayer Timed Release Aspirin Diclofenac Meclomen Sulindac  Arthritis pain formula Anacin Dicumarol Medipren Supac  Analgesic (Safety coated) Arthralgen Diffunasal Mefanamic Suprofen  Arthritis Strength Bufferin Dihydrocodeine Mepro Compound Suprol  Arthropan liquid Dopirydamole Methcarbomol with Aspirin Synalgos  ASA tablets/Enseals Disalcid Micrainin Tagament  Ascriptin Doan's Midol Talwin  Ascriptin A/D Dolene Mobidin Tanderil  Ascriptin Extra Strength Dolobid Moblgesic Ticlid  Ascriptin with Codeine Doloprin or Doloprin with Codeine Momentum Tolectin  Asperbuf Duoprin Mono-gesic Trendar  Aspergum Duradyne Motrin or Motrin IB Triminicin  Aspirin plain, buffered or enteric coated Durasal Myochrisine Trigesic  Aspirin Suppositories Easprin Nalfon Trillsate  Aspirin with Codeine Ecotrin Regular or Extra Strength Naprosyn Uracel  Atromid-S Efficin Naproxen Ursinus  Auranofin Capsules Elmiron Neocylate Vanquish  Axotal Emagrin Norgesic Verin  Azathioprine Empirin or Empirin with Codeine Normiflo Vitamin E  Azolid Emprazil Nuprin Voltaren  Bayer Aspirin plain, buffered or children's or timed BC Tablets or powders Encaprin Orgaran Warfarin Sodium  Buff-a-Comp Enoxaparin Orudis Zorpin  Buff-a-Comp with Codeine Equegesic Os-Cal-Gesic   Buffaprin Excedrin plain, buffered or Extra Strength Oxalid   Bufferin Arthritis Strength Feldene Oxphenbutazone   Bufferin plain or Extra Strength Feldene Capsules Oxycodone with Aspirin   Bufferin with Codeine Fenoprofen Fenoprofen Pabalate or Pabalate-SF   Buffets II Flogesic Panagesic   Buffinol plain or Extra Strength Florinal or Florinal with Codeine Panwarfarin   Buf-Tabs Flurbiprofen Penicillamine   Butalbital Compound Four-way cold tablets Penicillin   Butazolidin Fragmin Pepto-Bismol   Carbenicillin Geminisyn Percodan   Carna Arthritis Reliever Geopen Persantine    Carprofen Gold's salt Persistin   Chloramphenicol Goody's Phenylbutazone   Chloromycetin Haltrain Piroxlcam   Clmetidine heparin Plaquenil   Cllnoril Hyco-pap Ponstel   Clofibrate Hydroxy chloroquine Propoxyphen         Before stopping any of these medications, be sure to consult the physician who ordered them.  Some, such as Coumadin (Warfarin) are ordered to prevent or treat serious conditions such as "deep thrombosis", "pumonary embolisms", and other heart problems.  The amount of time that you may need   off of the medication may also vary with the medication and the reason for which you were taking it.  If you are taking any of these medications, please make sure you notify your pain physician before you undergo any procedures.          Epidural Steroid Injection An epidural steroid injection is a shot of steroid medicine and numbing medicine that is given into the space between the spinal cord and the bones in your back (epidural space). The shot helps relieve pain caused by an irritated or swollen nerve root. The amount of pain relief you get from the injection depends on what is causing the nerve to be swollen and irritated, and how long your pain lasts. You are more likely to benefit from this injection if your pain is strong and comes on suddenly rather than if you have had pain for a long time. Tell a health care provider about:  Any allergies you have.  All medicines you are taking, including vitamins, herbs, eye drops, creams, and over-the-counter medicines.  Any problems you or family members have had with anesthetic medicines.  Any blood disorders you have.  Any surgeries you have had.  Any medical conditions you have.  Whether you are pregnant or may be pregnant. What are the risks? Generally, this is a safe procedure. However, problems may occur, including:  Headache.  Bleeding.  Infection.  Allergic reaction to medicines.  Damage to your  nerves.  What happens before the procedure? Staying hydrated Follow instructions from your health care provider about hydration, which may include:  Up to 2 hours before the procedure - you may continue to drink clear liquids, such as water, clear fruit juice, black coffee, and plain tea.  Eating and drinking restrictions Follow instructions from your health care provider about eating and drinking, which may include:  8 hours before the procedure - stop eating heavy meals or foods such as meat, fried foods, or fatty foods.  6 hours before the procedure - stop eating light meals or foods, such as toast or cereal.  6 hours before the procedure - stop drinking milk or drinks that contain milk.  2 hours before the procedure - stop drinking clear liquids.  Medicine  You may be given medicines to lower anxiety.  Ask your health care provider about: ? Changing or stopping your regular medicines. This is especially important if you are taking diabetes medicines or blood thinners. ? Taking medicines such as aspirin and ibuprofen. These medicines can thin your blood. Do not take these medicines before your procedure if your health care provider instructs you not to. General instructions  Plan to have someone take you home from the hospital or clinic. What happens during the procedure?  You may receive a medicine to help you relax (sedative).  You will be asked to lie on your abdomen.  The injection site will be cleaned.  A numbing medicine (local anesthetic) will be used to numb the injection site.  A needle will be inserted through your skin into the epidural space. You may feel some discomfort when this happens. An X-ray machine will be used to make sure the needle is put as close as possible to the affected nerve.  A steroid medicine and a local anesthetic will be injected into the epidural space.  The needle will be removed.  A bandage (dressing) will be put over the injection  site. What happens after the procedure?  Your blood pressure, heart rate, breathing   rate, and blood oxygen level will be monitored until the medicines you were given have worn off.  Your arm or leg may feel weak or numb for a few hours.  The injection site may feel sore.  Do not drive for 24 hours if you received a sedative. This information is not intended to replace advice given to you by your health care provider. Make sure you discuss any questions you have with your health care provider. Document Released: 06/14/2007 Document Revised: 08/19/2015 Document Reviewed: 06/23/2015 [employer] Interactive Patient Education  2017 [employer]  

## 2016-09-29 ENCOUNTER — Emergency Department: Payer: Medicare Other

## 2016-09-29 ENCOUNTER — Telehealth: Payer: Self-pay

## 2016-09-29 ENCOUNTER — Observation Stay
Admission: EM | Admit: 2016-09-29 | Discharge: 2016-09-30 | Disposition: A | Discharge status: Home / Self Care | Payer: Medicare Other | Attending: Internal Medicine | Admitting: Internal Medicine

## 2016-09-29 ENCOUNTER — Encounter: Payer: Self-pay | Admitting: Emergency Medicine

## 2016-09-29 DIAGNOSIS — I25119 Atherosclerotic heart disease of native coronary artery with unspecified angina pectoris: Principal | ICD-10-CM | POA: Insufficient documentation

## 2016-09-29 DIAGNOSIS — Z950 Presence of cardiac pacemaker: Secondary | ICD-10-CM | POA: Diagnosis not present

## 2016-09-29 DIAGNOSIS — G894 Chronic pain syndrome: Secondary | ICD-10-CM | POA: Insufficient documentation

## 2016-09-29 DIAGNOSIS — Z79899 Other long term (current) drug therapy: Secondary | ICD-10-CM | POA: Diagnosis not present

## 2016-09-29 DIAGNOSIS — I10 Essential (primary) hypertension: Secondary | ICD-10-CM | POA: Insufficient documentation

## 2016-09-29 DIAGNOSIS — M542 Cervicalgia: Secondary | ICD-10-CM | POA: Insufficient documentation

## 2016-09-29 DIAGNOSIS — M5416 Radiculopathy, lumbar region: Secondary | ICD-10-CM | POA: Diagnosis not present

## 2016-09-29 DIAGNOSIS — Z955 Presence of coronary angioplasty implant and graft: Secondary | ICD-10-CM | POA: Diagnosis not present

## 2016-09-29 DIAGNOSIS — I200 Unstable angina: Secondary | ICD-10-CM

## 2016-09-29 DIAGNOSIS — I209 Angina pectoris, unspecified: Secondary | ICD-10-CM | POA: Diagnosis present

## 2016-09-29 DIAGNOSIS — Z881 Allergy status to other antibiotic agents status: Secondary | ICD-10-CM | POA: Insufficient documentation

## 2016-09-29 DIAGNOSIS — F329 Major depressive disorder, single episode, unspecified: Secondary | ICD-10-CM | POA: Insufficient documentation

## 2016-09-29 DIAGNOSIS — F419 Anxiety disorder, unspecified: Secondary | ICD-10-CM | POA: Insufficient documentation

## 2016-09-29 DIAGNOSIS — E785 Hyperlipidemia, unspecified: Secondary | ICD-10-CM | POA: Diagnosis not present

## 2016-09-29 DIAGNOSIS — K5903 Drug induced constipation: Secondary | ICD-10-CM | POA: Insufficient documentation

## 2016-09-29 DIAGNOSIS — G629 Polyneuropathy, unspecified: Secondary | ICD-10-CM | POA: Insufficient documentation

## 2016-09-29 DIAGNOSIS — E039 Hypothyroidism, unspecified: Secondary | ICD-10-CM | POA: Insufficient documentation

## 2016-09-29 DIAGNOSIS — R079 Chest pain, unspecified: Secondary | ICD-10-CM | POA: Diagnosis present

## 2016-09-29 DIAGNOSIS — Z951 Presence of aortocoronary bypass graft: Secondary | ICD-10-CM | POA: Insufficient documentation

## 2016-09-29 DIAGNOSIS — Z79891 Long term (current) use of opiate analgesic: Secondary | ICD-10-CM | POA: Diagnosis not present

## 2016-09-29 DIAGNOSIS — I252 Old myocardial infarction: Secondary | ICD-10-CM | POA: Diagnosis not present

## 2016-09-29 DIAGNOSIS — M4800 Spinal stenosis, site unspecified: Secondary | ICD-10-CM | POA: Diagnosis not present

## 2016-09-29 LAB — CBC
HCT: 39 % (ref 35.0–47.0)
Hemoglobin: 13.2 g/dL (ref 12.0–16.0)
MCH: 31.3 pg (ref 26.0–34.0)
MCHC: 33.9 g/dL (ref 32.0–36.0)
MCV: 92.1 fL (ref 80.0–100.0)
Platelets: 139 10*3/uL — ABNORMAL LOW (ref 150–440)
RBC: 4.23 MIL/uL (ref 3.80–5.20)
RDW: 14 % (ref 11.5–14.5)
WBC: 7.6 10*3/uL (ref 3.6–11.0)

## 2016-09-29 LAB — TROPONIN I: Troponin I: <0.03 ng/mL (ref <0.03)

## 2016-09-29 LAB — BASIC METABOLIC PANEL
Anion gap: 12 (ref 5–15)
BUN: 15 mg/dL (ref 6–20)
CO2: 29 mmol/L (ref 22–32)
Calcium: 10.1 mg/dL (ref 8.9–10.3)
Chloride: 98 mmol/L — ABNORMAL LOW (ref 101–111)
Creatinine, Ser: 0.92 mg/dL (ref 0.44–1.00)
GFR calc Af Amer: >60 mL/min (ref >60)
GFR calc non Af Amer: 58 mL/min — ABNORMAL LOW (ref >60)
Glucose, Bld: 153 mg/dL — ABNORMAL HIGH (ref 65–99)
Potassium: 3.3 mmol/L — ABNORMAL LOW (ref 3.5–5.1)
Sodium: 139 mmol/L (ref 135–145)

## 2016-09-29 MED ORDER — POTASSIUM CHLORIDE 20 MEQ PO PACK
40.0000 meq | PACK | Freq: Once | ORAL | Status: AC
  Administered 2016-09-29: 40 meq via ORAL
  Filled 2016-09-29: qty 2

## 2016-09-29 MED ORDER — POTASSIUM CHLORIDE CRYS ER 20 MEQ PO TBCR
20.0000 meq | EXTENDED_RELEASE_TABLET | Freq: Two times a day (BID) | ORAL | Status: DC
  Administered 2016-09-29 – 2016-09-30 (×2): 20 meq via ORAL
  Filled 2016-09-29 (×2): qty 1

## 2016-09-29 MED ORDER — NITROGLYCERIN 2 % TD OINT: 0.5000 [in_us] | TOPICAL_OINTMENT | Freq: Four times a day (QID) | TRANSDERMAL | Status: DC

## 2016-09-29 MED ORDER — ENOXAPARIN SODIUM 40 MG/0.4ML ~~LOC~~ SOLN
40.0000 mg | SUBCUTANEOUS | Status: DC
  Administered 2016-09-29: 40 mg via SUBCUTANEOUS
  Filled 2016-09-29: qty 0.4

## 2016-09-29 MED ORDER — PANTOPRAZOLE SODIUM 40 MG PO TBEC
40.0000 mg | DELAYED_RELEASE_TABLET | Freq: Every day | ORAL | Status: DC
  Administered 2016-09-29 – 2016-09-30 (×2): 40 mg via ORAL
  Filled 2016-09-29 (×2): qty 1

## 2016-09-29 MED ORDER — TRAZODONE HCL 50 MG PO TABS
50.0000 mg | ORAL_TABLET | Freq: Every day | ORAL | Status: DC
  Administered 2016-09-29: 50 mg via ORAL
  Filled 2016-09-29: qty 1

## 2016-09-29 MED ORDER — HEPARIN SODIUM (PORCINE) 5000 UNIT/ML IJ SOLN: 5000.0000 [IU] | Freq: Three times a day (TID) | INTRAMUSCULAR | Status: DC

## 2016-09-29 MED ORDER — VITAMIN B-12 1000 MCG PO TABS
500.0000 ug | ORAL_TABLET | Freq: Two times a day (BID) | ORAL | Status: DC
  Administered 2016-09-29 – 2016-09-30 (×2): 500 ug via ORAL
  Filled 2016-09-29 (×2): qty 1

## 2016-09-29 MED ORDER — SERTRALINE HCL 50 MG PO TABS
50.0000 mg | ORAL_TABLET | Freq: Every day | ORAL | Status: DC
  Administered 2016-09-29: 50 mg via ORAL
  Filled 2016-09-29: qty 1

## 2016-09-29 MED ORDER — ROSUVASTATIN CALCIUM 20 MG PO TABS
20.0000 mg | ORAL_TABLET | Freq: Every day | ORAL | Status: DC
  Administered 2016-09-29 – 2016-09-30 (×2): 20 mg via ORAL
  Filled 2016-09-29 (×2): qty 1
  Filled 2016-09-29 (×2): qty 2

## 2016-09-29 MED ORDER — ONDANSETRON HCL 4 MG PO TABS: 4.0000 mg | ORAL_TABLET | Freq: Four times a day (QID) | ORAL | Status: DC | PRN

## 2016-09-29 MED ORDER — SODIUM CHLORIDE 0.9 % IV SOLN
INTRAVENOUS | Status: DC
  Administered 2016-09-29: 23:00:00 via INTRAVENOUS

## 2016-09-29 MED ORDER — SODIUM CHLORIDE 0.9 % IV BOLUS (SEPSIS)
500.0000 mL | Freq: Once | INTRAVENOUS | Status: AC
  Administered 2016-09-29: 500 mL via INTRAVENOUS

## 2016-09-29 MED ORDER — NITROGLYCERIN 2 % TD OINT: 0.5000 [in_us] | TOPICAL_OINTMENT | Freq: Four times a day (QID) | TRANSDERMAL | Status: DC | PRN

## 2016-09-29 MED ORDER — LORATADINE 10 MG PO TABS
10.0000 mg | ORAL_TABLET | Freq: Every day | ORAL | Status: DC
  Administered 2016-09-30: 10 mg via ORAL
  Filled 2016-09-29: qty 1

## 2016-09-29 MED ORDER — NITROGLYCERIN 0.4 MG SL SUBL
0.4000 mg | SUBLINGUAL_TABLET | SUBLINGUAL | Status: DC | PRN
  Administered 2016-09-29: 0.4 mg via SUBLINGUAL
  Filled 2016-09-29: qty 1

## 2016-09-29 MED ORDER — ACETAMINOPHEN 325 MG PO TABS: 650.0000 mg | ORAL_TABLET | Freq: Four times a day (QID) | ORAL | Status: DC | PRN

## 2016-09-29 MED ORDER — LAMOTRIGINE 100 MG PO TABS
100.0000 mg | ORAL_TABLET | Freq: Every day | ORAL | Status: DC
  Administered 2016-09-29 – 2016-09-30 (×2): 100 mg via ORAL
  Filled 2016-09-29 (×3): qty 1

## 2016-09-29 MED ORDER — ASPIRIN 81 MG PO CHEW
81.0000 mg | CHEWABLE_TABLET | Freq: Every day | ORAL | Status: DC
  Administered 2016-09-30: 81 mg via ORAL
  Filled 2016-09-29: qty 1

## 2016-09-29 MED ORDER — SERTRALINE HCL 50 MG PO TABS
100.0000 mg | ORAL_TABLET | Freq: Every morning | ORAL | Status: DC
  Administered 2016-09-30: 100 mg via ORAL
  Filled 2016-09-29: qty 2

## 2016-09-29 MED ORDER — METOPROLOL TARTRATE 25 MG PO TABS
25.0000 mg | ORAL_TABLET | Freq: Two times a day (BID) | ORAL | Status: DC
  Administered 2016-09-29 – 2016-09-30 (×2): 25 mg via ORAL
  Filled 2016-09-29 (×2): qty 1

## 2016-09-29 MED ORDER — GABAPENTIN 300 MG PO CAPS
300.0000 mg | ORAL_CAPSULE | Freq: Every day | ORAL | Status: DC
  Administered 2016-09-29: 300 mg via ORAL
  Filled 2016-09-29: qty 1

## 2016-09-29 MED ORDER — LINACLOTIDE 145 MCG PO CAPS
145.0000 ug | ORAL_CAPSULE | Freq: Every day | ORAL | Status: DC
  Administered 2016-09-29 – 2016-09-30 (×2): 145 ug via ORAL
  Filled 2016-09-29 (×2): qty 1

## 2016-09-29 MED ORDER — ADULT MULTIVITAMIN W/MINERALS CH
1.0000 | ORAL_TABLET | Freq: Every day | ORAL | Status: DC
Start: 2016-09-29 — End: 2016-09-30
  Administered 2016-09-29 – 2016-09-30 (×2): 1 via ORAL
  Filled 2016-09-29 (×2): qty 1

## 2016-09-29 MED ORDER — THIOTHIXENE 5 MG PO CAPS
5.0000 mg | ORAL_CAPSULE | Freq: Two times a day (BID) | ORAL | Status: DC
  Administered 2016-09-29 – 2016-09-30 (×2): 5 mg via ORAL
  Filled 2016-09-29 (×3): qty 1

## 2016-09-29 MED ORDER — LEVOTHYROXINE SODIUM 50 MCG PO TABS
100.0000 ug | ORAL_TABLET | Freq: Every day | ORAL | Status: DC
  Administered 2016-09-30: 100 ug via ORAL
  Filled 2016-09-29: qty 2

## 2016-09-29 MED ORDER — ACETAMINOPHEN 650 MG RE SUPP: 650.0000 mg | Freq: Four times a day (QID) | RECTAL | Status: DC | PRN

## 2016-09-29 MED ORDER — ALUM & MAG HYDROXIDE-SIMETH 200-200-20 MG/5ML PO SUSP
30.0000 mL | Freq: Four times a day (QID) | ORAL | Status: DC | PRN
  Administered 2016-09-29: 30 mL via ORAL
  Filled 2016-09-29: qty 30

## 2016-09-29 MED ORDER — ASPIRIN 81 MG PO CHEW
324.0000 mg | CHEWABLE_TABLET | Freq: Once | ORAL | Status: AC
  Administered 2016-09-29: 324 mg via ORAL
  Filled 2016-09-29: qty 4

## 2016-09-29 MED ORDER — ONDANSETRON HCL 4 MG/2ML IJ SOLN: 4.0000 mg | Freq: Four times a day (QID) | INTRAMUSCULAR | Status: DC | PRN

## 2016-09-29 NOTE — Telephone Encounter (Signed)
Denies any needs at this time. Instructed to call if needed. 
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
## 2016-09-29 NOTE — ED Triage Notes (Signed)
Pt reports central chest pain radiating up her neck and to her back for three days. Pt reports SOB, nausea and vomiting and headache as well. Pt went to PCP Dr. Lavera Guise and sent here for evaluation.

## 2016-09-30 DIAGNOSIS — I25119 Atherosclerotic heart disease of native coronary artery with unspecified angina pectoris: Secondary | ICD-10-CM | POA: Diagnosis not present

## 2016-09-30 LAB — CBC
HCT: 34.5 % — ABNORMAL LOW (ref 35.0–47.0)
Hemoglobin: 11.8 g/dL — ABNORMAL LOW (ref 12.0–16.0)
MCH: 31.6 pg (ref 26.0–34.0)
MCHC: 34.2 g/dL (ref 32.0–36.0)
MCV: 92.4 fL (ref 80.0–100.0)
Platelets: 120 10*3/uL — ABNORMAL LOW (ref 150–440)
RBC: 3.74 MIL/uL — ABNORMAL LOW (ref 3.80–5.20)
RDW: 14 % (ref 11.5–14.5)
WBC: 6 10*3/uL (ref 3.6–11.0)

## 2016-09-30 LAB — BASIC METABOLIC PANEL
Anion gap: 6 (ref 5–15)
BUN: 17 mg/dL (ref 6–20)
CO2: 29 mmol/L (ref 22–32)
Calcium: 9.5 mg/dL (ref 8.9–10.3)
Chloride: 107 mmol/L (ref 101–111)
Creatinine, Ser: 0.85 mg/dL (ref 0.44–1.00)
GFR calc Af Amer: >60 mL/min (ref >60)
GFR calc non Af Amer: >60 mL/min (ref >60)
Glucose, Bld: 109 mg/dL — ABNORMAL HIGH (ref 65–99)
Potassium: 4.1 mmol/L (ref 3.5–5.1)
Sodium: 142 mmol/L (ref 135–145)

## 2016-09-30 MED ORDER — ISOSORBIDE MONONITRATE ER 30 MG PO TB24
30.0000 mg | ORAL_TABLET | Freq: Every day | ORAL | Status: DC
  Administered 2016-09-30: 30 mg via ORAL
  Filled 2016-09-30: qty 1

## 2016-09-30 NOTE — Care Management Obs Status (Signed)
[facility] NOTIFICATION   Patient Details  Name: Kristi [name] MRN: [id number] Date of Birth: [birth year]   Medicare Observation Status Notification Given:  Yes    Katrina Stack, RN 09/30/2016, 9:08 AM

## 2016-09-30 NOTE — Progress Notes (Signed)
Pt arrived via stretcher from ED. Pt A&O, with no complaints of pain at this time. Telemetry monitor placed and called to CCMD. Oriented to room and callbell, phone numbers.

## 2016-09-30 NOTE — Discharge Instructions (Signed)
Chest Wall Pain °Chest wall pain is pain in or around the bones and muscles of your chest. Sometimes, an injury causes this pain. Sometimes, the cause may not be known. This pain may take several weeks or longer to get better. °Follow these instructions at home: °Pay attention to any changes in your symptoms. Take these actions to help with your pain: °· Rest as told by your doctor. °· Avoid activities that cause pain. Try not to use your chest, belly (abdominal), or side muscles to lift heavy things. °· If directed, apply ice to the painful area: °? Put ice in a plastic bag. °? Place a towel between your skin and the bag. °? Leave the ice on for 20 minutes, 2-3 times per day. °· Take over-the-counter and prescription medicines only as told by your doctor. °· Do not use tobacco products, including cigarettes, chewing tobacco, and e-cigarettes. If you need help quitting, ask your doctor. °· Keep all follow-up visits as told by your doctor. This is important. ° °Contact a doctor if: °· You have a fever. °· Your chest pain gets worse. °· You have new symptoms. °Get help right away if: °· You feel sick to your stomach (nauseous) or you throw up (vomit). °· You feel sweaty or light-headed. °· You have a cough with phlegm (sputum) or you cough up blood. °· You are short of breath. °This information is not intended to replace advice given to you by your health care provider. Make sure you discuss any questions you have with your health care provider. °Document Released: 08/24/2007 Document Revised: 08/13/2015 Document Reviewed: 06/02/2014 °Elsevier Interactive Patient Education © 2018 [employer] ° °

## 2016-09-30 NOTE — Plan of Care (Signed)
Problem: Education: Goal: Knowledge of [place] [facility] Education information/materials will improve Outcome: Progressing Patient admitted for chest pain. The plan of care includes continous cardiopulmonary monitoring. Patient to be discharged to home for cardiology follow-up.

## 2016-09-30 NOTE — Progress Notes (Signed)
Discharge instructions reviewed with patient. Patient verbalized understanding. IV removed, pressure dressing applied. NAD noted. Patient encourage to ask questions and expresses no concerns at this  Time/

## 2016-09-30 NOTE — Progress Notes (Signed)
Notified MD of complaints of indigestion. Orders placed.
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
## 2016-10-19 ENCOUNTER — Ambulatory Visit: Payer: Medicare Other | Attending: Pain Medicine | Admitting: Pain Medicine

## 2016-10-19 ENCOUNTER — Encounter: Payer: Self-pay | Admitting: Pain Medicine

## 2016-10-19 VITALS — BP 111/79 | HR 79 | Temp 98.0°F | Resp 16 | Ht 66.0 in | Wt 145.0 lb

## 2016-10-19 DIAGNOSIS — Z7982 Long term (current) use of aspirin: Secondary | ICD-10-CM | POA: Diagnosis not present

## 2016-10-19 DIAGNOSIS — Z950 Presence of cardiac pacemaker: Secondary | ICD-10-CM | POA: Diagnosis not present

## 2016-10-19 DIAGNOSIS — M79604 Pain in right leg: Secondary | ICD-10-CM

## 2016-10-19 DIAGNOSIS — R413 Other amnesia: Secondary | ICD-10-CM | POA: Insufficient documentation

## 2016-10-19 DIAGNOSIS — M5441 Lumbago with sciatica, right side: Secondary | ICD-10-CM

## 2016-10-19 DIAGNOSIS — G8929 Other chronic pain: Secondary | ICD-10-CM | POA: Insufficient documentation

## 2016-10-19 DIAGNOSIS — M48061 Spinal stenosis, lumbar region without neurogenic claudication: Secondary | ICD-10-CM | POA: Diagnosis not present

## 2016-10-19 DIAGNOSIS — G894 Chronic pain syndrome: Secondary | ICD-10-CM | POA: Diagnosis not present

## 2016-10-19 DIAGNOSIS — I2510 Atherosclerotic heart disease of native coronary artery without angina pectoris: Secondary | ICD-10-CM | POA: Diagnosis not present

## 2016-10-19 DIAGNOSIS — Z880 Allergy status to penicillin: Secondary | ICD-10-CM | POA: Insufficient documentation

## 2016-10-19 DIAGNOSIS — M5410 Radiculopathy, site unspecified: Secondary | ICD-10-CM | POA: Diagnosis not present

## 2016-10-19 DIAGNOSIS — M25532 Pain in left wrist: Secondary | ICD-10-CM | POA: Diagnosis not present

## 2016-10-19 DIAGNOSIS — I200 Unstable angina: Secondary | ICD-10-CM | POA: Diagnosis not present

## 2016-10-19 DIAGNOSIS — M25562 Pain in left knee: Secondary | ICD-10-CM | POA: Diagnosis not present

## 2016-10-19 DIAGNOSIS — E785 Hyperlipidemia, unspecified: Secondary | ICD-10-CM | POA: Insufficient documentation

## 2016-10-19 DIAGNOSIS — R296 Repeated falls: Secondary | ICD-10-CM | POA: Insufficient documentation

## 2016-10-19 DIAGNOSIS — M4696 Unspecified inflammatory spondylopathy, lumbar region: Secondary | ICD-10-CM

## 2016-10-19 DIAGNOSIS — R079 Chest pain, unspecified: Secondary | ICD-10-CM | POA: Diagnosis not present

## 2016-10-19 DIAGNOSIS — M5416 Radiculopathy, lumbar region: Secondary | ICD-10-CM | POA: Diagnosis not present

## 2016-10-19 DIAGNOSIS — I10 Essential (primary) hypertension: Secondary | ICD-10-CM | POA: Diagnosis not present

## 2016-10-19 DIAGNOSIS — F329 Major depressive disorder, single episode, unspecified: Secondary | ICD-10-CM | POA: Diagnosis not present

## 2016-10-19 DIAGNOSIS — Z79891 Long term (current) use of opiate analgesic: Secondary | ICD-10-CM | POA: Insufficient documentation

## 2016-10-19 DIAGNOSIS — M25561 Pain in right knee: Secondary | ICD-10-CM | POA: Diagnosis not present

## 2016-10-19 DIAGNOSIS — M5442 Lumbago with sciatica, left side: Secondary | ICD-10-CM

## 2016-10-19 DIAGNOSIS — Z79899 Other long term (current) drug therapy: Secondary | ICD-10-CM | POA: Insufficient documentation

## 2016-10-19 DIAGNOSIS — M47816 Spondylosis without myelopathy or radiculopathy, lumbar region: Secondary | ICD-10-CM

## 2016-10-19 DIAGNOSIS — F419 Anxiety disorder, unspecified: Secondary | ICD-10-CM | POA: Diagnosis not present

## 2016-10-19 DIAGNOSIS — K219 Gastro-esophageal reflux disease without esophagitis: Secondary | ICD-10-CM | POA: Diagnosis not present

## 2016-10-19 DIAGNOSIS — M79605 Pain in left leg: Secondary | ICD-10-CM

## 2016-10-19 DIAGNOSIS — I252 Old myocardial infarction: Secondary | ICD-10-CM | POA: Insufficient documentation

## 2016-10-19 NOTE — Progress Notes (Signed)
Safety precautions to be maintained throughout the outpatient stay will include: orient to surroundings, keep bed in low position, maintain call bell within reach at all times, provide assistance with transfer out of bed and ambulation.  

## 2016-10-19 NOTE — Progress Notes (Signed)
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 series of 2-3 injections spaced 3-6 weeks apart to maximally decrease your pain.  After the initial series, you may be a candidate for a more permanent nerve block of the facets.  If you have any questions, please call #[phone]) [facility]

## 2016-10-19 NOTE — Patient Instructions (Addendum)
____________________________________________________________________________________________  Preparing for Procedure with Sedation Instructions: . Oral Intake: Do not eat or drink anything for at least 8 hours prior to your procedure. . Transportation: Public transportation is not allowed. Bring an adult driver. The driver must be physically present in our waiting room before any procedure can be started. [name] Physical Assistance: Bring an adult physically capable of assisting you, in the event you need help. This adult should keep you company at home for at least 6 hours after the procedure. . Blood Pressure Medicine: Take your blood pressure medicine with a sip of water the morning of the procedure. . Blood thinners:  . Diabetics on insulin: Notify the staff so that you can be scheduled 1st case in the morning. If your diabetes requires high dose insulin, take only  of your normal insulin dose the morning of the procedure and notify the staff that you have done so. . Preventing infections: Shower with an antibacterial soap the morning of your procedure. . Build-up your immune system: Take 1000 mg of Vitamin C with every meal (3 times a day) the day prior to your procedure. [name] Antibiotics: Inform the staff if you have a condition or reason that requires you to take antibiotics before dental procedures. . Pregnancy: If you are pregnant, call and cancel the procedure. . Sickness: If you have a cold, fever, or any active infections, call and cancel the procedure. . Arrival: You must be in the facility at least 30 minutes prior to your scheduled procedure. . Children: Do not bring children with you. . Dress appropriately: Bring dark clothing that you would not mind if they get stained. . Valuables: Do not bring any jewelry or valuables. Procedure appointments are reserved for interventional treatments only. [name] No Prescription Refills. . No medication changes will be discussed during procedure  appointments. . No disability issues will be discussed. ____________________________________________________________________________________________  Preparing for Procedure with Sedation Instructions: . Oral Intake: Do not eat or drink anything for at least 8 hours prior to your procedure. . Transportation: Public transportation is not allowed. Bring an adult driver. The driver must be physically present in our waiting room before any procedure can be started. [name] Physical Assistance: Bring an adult capable of physically assisting you, in the event you need help. . Blood Pressure Medicine: Take your blood pressure medicine with a sip of water the morning of the procedure. . Insulin: Take only  of your normal insulin dose. . Preventing infections: Shower with an antibacterial soap the morning of your procedure. . Build-up your immune system: Take 1000 mg of Vitamin C with every meal (3 times a day) the day prior to your procedure. . Pregnancy: If you are pregnant, call and cancel the procedure. . Sickness: If you have a cold, fever, or any active infections, call and cancel the procedure. . Arrival: You must be in the facility at least 30 minutes prior to your scheduled procedure. . Children: Do not bring children with you. . Dress appropriately: Bring dark clothing that you would not mind if they get stained. . Valuables: Do not bring any jewelry or valuables. Procedure appointments are reserved for interventional treatments only. [name] No Prescription Refills. . No medication changes will be discussed during procedure appointments. No disability issues will be discussed.Facet Blocks Patient Information  Description: The facets are joints in the spine between the vertebrae.  Like any joints in the body, facets can become irritated and painful.  Arthritis can also effect the facets.  By injecting  steroids and local anesthetic in and around these joints, we can temporarily block the nerve supply  to them.  Steroids act directly on irritated nerves and tissues to reduce selling and inflammation which often leads to decreased pain.  Facet blocks may be done anywhere along the spine from the neck to the low back depending upon the location of your pain.   After numbing the skin with local anesthetic (like Novocaine), a small needle is passed onto the facet joints under x-ray guidance.  You may experience a sensation of pressure while this is being done.  The entire block usually lasts about 15-25 minutes.   Conditions which may be treated by facet blocks:   Low back/buttock pain  Neck/shoulder pain  Certain types of headaches  Preparation for the injection:  1. Do not eat any solid food or dairy products within 8 hours of your appointment. 2. You may drink clear liquid up to 3 hours before appointment.  Clear liquids include water, black coffee, juice or soda.  No milk or cream please. 3. You may take your regular medication, including pain medications, with a sip of water before your appointment.  Diabetics should hold regular insulin (if taken separately) and take 1/2 normal NPH dose the morning of the procedure.  Carry some sugar containing items with you to your appointment. 4. A driver must accompany you and be prepared to drive you home after your procedure. 5. Bring all your current medications with you. 6. An IV may be inserted and sedation may be given at the discretion of the physician. 7. A blood pressure cuff, EKG and other monitors will often be applied during the procedure.  Some patients may need to have extra oxygen administered for a short period. 8. You will be asked to provide medical information, including your allergies and medications, prior to the procedure.  We must know immediately if you are taking blood thinners (like Coumadin/Warfarin) or if you are allergic to IV iodine contrast (dye).  We must know if you could possible be pregnant.  Possible  side-effects:   Bleeding from needle site  Infection (rare, may require surgery)  Nerve injury (rare)  Numbness & tingling (temporary)  Difficulty urinating (rare, temporary)  Spinal headache (a headache worse with upright posture)  Light-headedness (temporary)  Pain at injection site (serveral days)  Decreased blood pressure (rare, temporary)  Weakness in arm/leg (temporary)  Pressure sensation in back/neck (temporary)   Call if you experience:   Fever/chills associated with headache or increased back/neck pain  Headache worsened by an upright position  New onset, weakness or numbness of an extremity below the injection site  Hives or difficulty breathing (go to the emergency room)  Inflammation or drainage at the injection site(s)  Severe back/neck pain greater than usual  New symptoms which are concerning to you  Please note:  Although the local anesthetic injected can often make your back or neck feel good for several hours after the injection, the pain will likely return. It takes 3-7 days for steroids to work.  You may not notice any pain relief for at least one week.  If effective, we will often do a series of 2-3 injections spaced 3-6 weeks apart to maximally decrease your pain.  After the initial series, you may be a candidate for a more permanent nerve block of the facets.  If you have any questions, please call #[phone]) [facility]

## 2016-10-20 ENCOUNTER — Ambulatory Visit: Payer: Medicare Other | Admitting: Pain Medicine

## 2016-10-27 DIAGNOSIS — R251 Tremor, unspecified: Secondary | ICD-10-CM | POA: Insufficient documentation

## 2016-10-27 DIAGNOSIS — G20 Parkinson's disease: Secondary | ICD-10-CM | POA: Insufficient documentation

## 2016-11-08 ENCOUNTER — Ambulatory Visit: Payer: Medicare Other | Admitting: Pain Medicine

## 2016-11-09 ENCOUNTER — Encounter: Payer: Self-pay | Admitting: Nurse Practitioner

## 2016-11-09 ENCOUNTER — Ambulatory Visit: Payer: Medicare Other | Attending: Nurse Practitioner | Admitting: Nurse Practitioner

## 2016-11-09 VITALS — BP 103/67 | HR 82 | Temp 97.9°F | Resp 16 | Ht 66.0 in | Wt 145.0 lb

## 2016-11-09 DIAGNOSIS — G8929 Other chronic pain: Secondary | ICD-10-CM

## 2016-11-09 DIAGNOSIS — Z880 Allergy status to penicillin: Secondary | ICD-10-CM | POA: Insufficient documentation

## 2016-11-09 DIAGNOSIS — M48061 Spinal stenosis, lumbar region without neurogenic claudication: Secondary | ICD-10-CM | POA: Insufficient documentation

## 2016-11-09 DIAGNOSIS — M79604 Pain in right leg: Secondary | ICD-10-CM | POA: Diagnosis not present

## 2016-11-09 DIAGNOSIS — Z9889 Other specified postprocedural states: Secondary | ICD-10-CM | POA: Diagnosis not present

## 2016-11-09 DIAGNOSIS — G629 Polyneuropathy, unspecified: Secondary | ICD-10-CM | POA: Diagnosis not present

## 2016-11-09 DIAGNOSIS — F329 Major depressive disorder, single episode, unspecified: Secondary | ICD-10-CM | POA: Diagnosis not present

## 2016-11-09 DIAGNOSIS — M25561 Pain in right knee: Secondary | ICD-10-CM | POA: Diagnosis not present

## 2016-11-09 DIAGNOSIS — M5416 Radiculopathy, lumbar region: Secondary | ICD-10-CM | POA: Diagnosis not present

## 2016-11-09 DIAGNOSIS — G894 Chronic pain syndrome: Secondary | ICD-10-CM | POA: Insufficient documentation

## 2016-11-09 DIAGNOSIS — M79605 Pain in left leg: Secondary | ICD-10-CM | POA: Diagnosis not present

## 2016-11-09 DIAGNOSIS — Z955 Presence of coronary angioplasty implant and graft: Secondary | ICD-10-CM | POA: Diagnosis not present

## 2016-11-09 DIAGNOSIS — M5410 Radiculopathy, site unspecified: Secondary | ICD-10-CM

## 2016-11-09 DIAGNOSIS — M47816 Spondylosis without myelopathy or radiculopathy, lumbar region: Secondary | ICD-10-CM

## 2016-11-09 DIAGNOSIS — E785 Hyperlipidemia, unspecified: Secondary | ICD-10-CM | POA: Diagnosis not present

## 2016-11-09 DIAGNOSIS — I252 Old myocardial infarction: Secondary | ICD-10-CM | POA: Insufficient documentation

## 2016-11-09 DIAGNOSIS — Z951 Presence of aortocoronary bypass graft: Secondary | ICD-10-CM | POA: Diagnosis not present

## 2016-11-09 DIAGNOSIS — M4696 Unspecified inflammatory spondylopathy, lumbar region: Secondary | ICD-10-CM | POA: Diagnosis not present

## 2016-11-09 DIAGNOSIS — Z79899 Other long term (current) drug therapy: Secondary | ICD-10-CM | POA: Insufficient documentation

## 2016-11-09 DIAGNOSIS — I10 Essential (primary) hypertension: Secondary | ICD-10-CM | POA: Diagnosis not present

## 2016-11-09 DIAGNOSIS — F419 Anxiety disorder, unspecified: Secondary | ICD-10-CM | POA: Insufficient documentation

## 2016-11-09 DIAGNOSIS — E039 Hypothyroidism, unspecified: Secondary | ICD-10-CM | POA: Insufficient documentation

## 2016-11-09 DIAGNOSIS — Z950 Presence of cardiac pacemaker: Secondary | ICD-10-CM | POA: Insufficient documentation

## 2016-11-09 DIAGNOSIS — M792 Neuralgia and neuritis, unspecified: Secondary | ICD-10-CM

## 2016-11-09 DIAGNOSIS — K219 Gastro-esophageal reflux disease without esophagitis: Secondary | ICD-10-CM | POA: Insufficient documentation

## 2016-11-09 DIAGNOSIS — M542 Cervicalgia: Secondary | ICD-10-CM | POA: Insufficient documentation

## 2016-11-09 DIAGNOSIS — I2510 Atherosclerotic heart disease of native coronary artery without angina pectoris: Secondary | ICD-10-CM | POA: Diagnosis not present

## 2016-11-09 DIAGNOSIS — M25562 Pain in left knee: Secondary | ICD-10-CM | POA: Diagnosis not present

## 2016-11-09 DIAGNOSIS — Z7982 Long term (current) use of aspirin: Secondary | ICD-10-CM | POA: Diagnosis not present

## 2016-11-09 DIAGNOSIS — Z79891 Long term (current) use of opiate analgesic: Secondary | ICD-10-CM | POA: Diagnosis not present

## 2016-11-09 MED ORDER — GABAPENTIN 300 MG PO CAPS: 300.0000 mg | ORAL_CAPSULE | Freq: Three times a day (TID) | ORAL | 0 refills | Status: DC

## 2016-11-09 NOTE — Patient Instructions (Signed)

## 2016-11-09 NOTE — Progress Notes (Signed)
Safety precautions to be maintained throughout the outpatient stay will include: orient to surroundings, keep bed in low position, maintain call bell within reach at all times, provide assistance with transfer out of bed and ambulation.  

## 2016-11-09 NOTE — Progress Notes (Deleted)
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 does not constitute a valid excuse. The use of any illegal substances is prohibited. 10. Adopted CDC guidelines & recommendations: Target dosing levels will be at or below 60 MME/day. Use of benzodiazepines** is not recommended.  Exceptions: There are only two exceptions to the rule of not receiving pain medications from other Healthcare Providers. 1. Exception #1 (Emergencies): In the event of an emergency (i.e.: accident requiring emergency care), you are allowed to receive additional pain medication. However, you are responsible for: As soon as you are able, call our office [phone], at any time of the day or night, and leave a message stating your name, the date and nature of the emergency, and the name and dose of the medication prescribed. In the event that your call is answered  by a member of our staff, make sure to document and save the date, time, and the name of the person that took your information.  2. Exception #2 (Planned Surgery): In the event that you are scheduled by another doctor or dentist to have any type of surgery or procedure, you are allowed (for a period no longer than 30 days), to receive additional pain medication, for the acute post-op pain. However, in this case, you are responsible for picking up a copy of our "Post-op Pain Management for Surgeons" handout, and giving it to your surgeon or dentist. This document is available at our office, and does not require an appointment to obtain it. Simply go to our office during business hours (Monday-Thursday from 8:00 AM to 4:00 PM) (Friday 8:00 AM to 12:00 Noon) or if you have a scheduled appointment with [place], prior to your surgery, and ask for it by name. In addition, you will need to provide [place] with your name, name of your surgeon, type of surgery, and date of procedure or surgery.  *Opioid medications include: morphine, codeine, oxycodone, oxymorphone, hydrocodone, hydromorphone, meperidine, tramadol, tapentadol, buprenorphine, fentanyl, methadone. **Benzodiazepine medications include: diazepam (Valium), alprazolam (Xanax), clonazepam (Klonopine), lorazepam (Ativan), clorazepate (Tranxene), chlordiazepoxide (Librium), estazolam (Prosom), oxazepam (Serax), temazepam (Restoril), triazolam (Halcion)  ____________________________________________________________________________________________

## 2016-11-09 NOTE — Progress Notes (Signed)
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 [phone], at any time of the day or night, and leave a message stating your name, the date and nature of the emergency, and the name and dose of the medication prescribed. In the event that your call is answered by a member of our staff, make sure to document and save the date, time, and the name of the person that took your information.  2. Exception #2 (Planned Surgery): In the event that you are scheduled by another doctor or dentist to have any type of surgery or procedure, you are allowed (for a period no longer than 30 days), to receive additional pain medication, for the acute post-op pain. However, in this case, you are responsible for picking up a copy of our "Post-op Pain Management for Surgeons" handout, and giving it to your surgeon or dentist. This document is available at our office, and does not require an appointment to obtain it. Simply go to our office during  business hours (Monday-Thursday from 8:00 AM to 4:00 PM) (Friday 8:00 AM to 12:00 Noon) or if you have a scheduled appointment with [place], prior to your surgery, and ask for it by name. In addition, you will need to provide [place] with your name, name of your surgeon, type of surgery, and date of procedure or surgery.  *Opioid medications include: morphine, codeine, oxycodone, oxymorphone, hydrocodone, hydromorphone, meperidine, tramadol, tapentadol, buprenorphine, fentanyl, methadone. **Benzodiazepine medications include: diazepam (Valium), alprazolam (Xanax), clonazepam (Klonopine), lorazepam (Ativan), clorazepate (Tranxene), chlordiazepoxide (Librium), estazolam (Prosom), oxazepam (Serax), temazepam (Restoril), triazolam (Halcion)  ____________________________________________________________________________________________

## 2016-11-15 ENCOUNTER — Ambulatory Visit
Admission: RE | Admit: 2016-11-15 | Discharge: 2016-11-15 | Disposition: A | Discharge status: Home / Self Care | Payer: Medicare Other | Source: Ambulatory Visit | Attending: Pain Medicine | Admitting: Pain Medicine

## 2016-11-15 ENCOUNTER — Encounter: Payer: Self-pay | Admitting: Pain Medicine

## 2016-11-15 ENCOUNTER — Ambulatory Visit (HOSPITAL_BASED_OUTPATIENT_CLINIC_OR_DEPARTMENT_OTHER): Payer: Medicare Other | Admitting: Pain Medicine

## 2016-11-15 VITALS — BP 120/72 | HR 60 | Temp 96.6°F | Resp 18 | Ht 66.0 in | Wt 145.0 lb

## 2016-11-15 DIAGNOSIS — M47816 Spondylosis without myelopathy or radiculopathy, lumbar region: Secondary | ICD-10-CM

## 2016-11-15 DIAGNOSIS — M545 Low back pain: Secondary | ICD-10-CM | POA: Insufficient documentation

## 2016-11-15 DIAGNOSIS — G8929 Other chronic pain: Secondary | ICD-10-CM | POA: Insufficient documentation

## 2016-11-15 DIAGNOSIS — M4696 Unspecified inflammatory spondylopathy, lumbar region: Secondary | ICD-10-CM

## 2016-11-15 DIAGNOSIS — Z951 Presence of aortocoronary bypass graft: Secondary | ICD-10-CM | POA: Diagnosis not present

## 2016-11-15 DIAGNOSIS — Z955 Presence of coronary angioplasty implant and graft: Secondary | ICD-10-CM | POA: Insufficient documentation

## 2016-11-15 DIAGNOSIS — M5441 Lumbago with sciatica, right side: Secondary | ICD-10-CM

## 2016-11-15 DIAGNOSIS — Z880 Allergy status to penicillin: Secondary | ICD-10-CM | POA: Insufficient documentation

## 2016-11-15 DIAGNOSIS — Z950 Presence of cardiac pacemaker: Secondary | ICD-10-CM | POA: Insufficient documentation

## 2016-11-15 DIAGNOSIS — M4726 Other spondylosis with radiculopathy, lumbar region: Secondary | ICD-10-CM

## 2016-11-15 DIAGNOSIS — Z90710 Acquired absence of both cervix and uterus: Secondary | ICD-10-CM | POA: Insufficient documentation

## 2016-11-15 DIAGNOSIS — M5442 Lumbago with sciatica, left side: Secondary | ICD-10-CM

## 2016-11-15 DIAGNOSIS — Z9889 Other specified postprocedural states: Secondary | ICD-10-CM | POA: Diagnosis not present

## 2016-11-15 MED ORDER — ROPIVACAINE HCL 2 MG/ML IJ SOLN
9.0000 mL | Freq: Once | INTRAMUSCULAR | Status: AC
  Administered 2016-11-15: 10 mL via PERINEURAL
  Filled 2016-11-15: qty 10

## 2016-11-15 MED ORDER — TRIAMCINOLONE ACETONIDE 40 MG/ML IJ SUSP
40.0000 mg | Freq: Once | INTRAMUSCULAR | Status: AC
Start: 2016-11-15 — End: 2016-11-15
  Administered 2016-11-15: 40 mg
  Filled 2016-11-15: qty 1

## 2016-11-15 MED ORDER — TRIAMCINOLONE ACETONIDE 40 MG/ML IJ SUSP
40.0000 mg | Freq: Once | INTRAMUSCULAR | Status: AC
  Administered 2016-11-15: 40 mg
  Filled 2016-11-15: qty 1

## 2016-11-15 MED ORDER — FENTANYL CITRATE (PF) 100 MCG/2ML IJ SOLN
25.0000 ug | INTRAMUSCULAR | Status: DC | PRN
  Administered 2016-11-15: 50 ug via INTRAVENOUS
  Filled 2016-11-15: qty 2

## 2016-11-15 MED ORDER — LIDOCAINE HCL 2 % IJ SOLN
10.0000 mL | Freq: Once | INTRAMUSCULAR | Status: AC
  Administered 2016-11-15: 400 mg

## 2016-11-15 MED ORDER — MIDAZOLAM HCL 5 MG/5ML IJ SOLN
1.0000 mg | INTRAMUSCULAR | Status: DC | PRN
  Administered 2016-11-15: 2 mg via INTRAVENOUS
  Filled 2016-11-15: qty 5

## 2016-11-15 MED ORDER — LACTATED RINGERS IV SOLN
1000.0000 mL | Freq: Once | INTRAVENOUS | Status: AC
  Administered 2016-11-15: 1000 mL via INTRAVENOUS

## 2016-11-15 NOTE — Progress Notes (Signed)
Safety precautions to be maintained throughout the outpatient stay will include: orient to surroundings, keep bed in low position, maintain call bell within reach at all times, provide assistance with transfer out of bed and ambulation.  

## 2016-11-15 NOTE — Patient Instructions (Addendum)
____________________________________________________________________________________________  Post-Procedure instructions Instructions:  Apply ice: Fill a plastic sandwich bag with crushed ice. Cover it with a small towel and apply to injection site. Apply for 15 minutes then remove x 15 minutes. Repeat sequence on day of procedure, until you go to bed. The purpose is to minimize swelling and discomfort after procedure.  Apply heat: Apply heat to procedure site starting the day following the procedure. The purpose is to treat any soreness and discomfort from the procedure.  Food intake: Start with clear liquids (like water) and advance to regular food, as tolerated.   Physical activities: Keep activities to a minimum for the first 8 hours after the procedure.   Driving: If you have received any sedation, you are not allowed to drive for 24 hours after your procedure.  Blood thinner: Restart your blood thinner 6 hours after your procedure. (Only for those taking blood thinners)  Insulin: As soon as you can eat, you may resume your normal dosing schedule. (Only for those taking insulin)  Infection prevention: Keep procedure site clean and dry.  Post-procedure Pain Diary: Extremely important that this be done correctly and accurately. Recorded information will be used to determine the next step in treatment.  Pain evaluated is that of treated area only. Do not include pain from an untreated area.  Complete every hour, on the hour, for the initial 8 hours. Set an alarm to help you do this part accurately.  Do not go to sleep and have it completed later. It will not be accurate.  Follow-up appointment: Keep your follow-up appointment after the procedure. Usually 2 weeks for most procedures. (6 weeks in the case of radiofrequency.) Bring you pain diary.  Expect:  From numbing medicine (AKA: Local Anesthetics): Numbness or decrease in pain.  Onset: Full effect within 15 minutes of  injected.  Duration: It will depend on the type of local anesthetic used. On the average, 1 to 8 hours.   From steroids: Decrease in swelling or inflammation. Once inflammation is improved, relief of the pain will follow.  Onset of benefits: Depends on the amount of swelling present. The more swelling, the longer it will take for the benefits to be seen. In some cases, up to 10 days.  Duration: Steroids will stay in the system x 2 weeks. Duration of benefits will depend on multiple posibilities including persistent irritating factors.  From procedure: Some discomfort is to be expected once the numbing medicine wears off. This should be minimal if ice and heat are applied as instructed. Call if:  You experience numbness and weakness that gets worse with time, as opposed to wearing off.  New onset bowel or bladder incontinence. (Spinal procedures only)  Emergency Numbers:  Durning business hours (Monday - Thursday, 8:00 AM - 4:00 PM) (Friday, 9:00 AM - 12:00 Noon): [phone]  After hours: [phone] ____________________________________________________________________________________________  Pain Management Discharge Instructions  General Discharge Instructions :  If you need to reach your doctor call: Monday-Friday 8:00 am - 4:00 pm at [phone] or toll free [phone].  After clinic hours [phone] to have operator reach doctor.  Bring all of your medication bottles to all your appointments in the pain clinic.  To cancel or reschedule your appointment with Pain Management please remember to call 24 hours in advance to avoid a fee.  Refer to the educational materials which you have been given on: General Risks, I had my Procedure. Discharge Instructions, Post Sedation.  Post Procedure Instructions:  The drugs you   were given will stay in your system until tomorrow, so for the next 24 hours you should not drive, make any legal decisions or drink any alcoholic  beverages.  You may eat anything you prefer, but it is better to start with liquids then soups and crackers, and gradually work up to solid foods.  Please notify your doctor immediately if you have any unusual bleeding, trouble breathing or pain that is not related to your normal pain.  Depending on the type of procedure that was done, some parts of your body may feel week and/or numb.  This usually clears up by tonight or the next day.  Walk with the use of an assistive device or accompanied by an adult for the 24 hours.  You may use ice on the affected area for the first 24 hours.  Put ice in a Ziploc bag and cover with a towel and place against area 15 minutes on 15 minutes off.  You may switch to heat after 24 hours. Facet Joint Block The facet joints connect the bones of the spine (vertebrae). They make it possible for you to bend, twist, and make other movements with your spine. They also keep you from bending too far, twisting too far, and making other excessive movements. A facet joint block is a procedure where a numbing medicine (anesthetic) is injected into a facet joint. Often, a type of anti-inflammatory medicine called a steroid is also injected. A facet joint block may be done to diagnose neck or back pain. If the pain gets better after a facet joint block, it means the pain is probably coming from the facet joint. If the pain does not get better, it means the pain is probably not coming from the facet joint. A facet joint block may also be done to relieve neck or back pain caused by an inflamed facet joint. A facet joint block is only done to relieve pain if the pain does not improve with other methods, such as medicine, exercise programs, and physical therapy. Tell a health care provider about:  Any allergies you have.  All medicines you are taking, including vitamins, herbs, eye drops, creams, and over-the-counter medicines.  Any problems you or family members have had with  anesthetic medicines.  Any blood disorders you have.  Any surgeries you have had.  Any medical conditions you have.  Whether you are pregnant or may be pregnant. What are the risks? Generally, this is a safe procedure. However, problems may occur, including:  Bleeding.  Injury to a nerve near the injection site.  Pain at the injection site.  Weakness or numbness in areas controlled by nerves near the injection site.  Infection.  Temporary fluid retention.  Allergic reactions to medicines or dyes.  Injury to other structures or organs near the injection site.  What happens before the procedure?  Follow instructions from your health care provider about eating or drinking restrictions.  Ask your health care provider about: ? Changing or stopping your regular medicines. This is especially important if you are taking diabetes medicines or blood thinners. ? Taking medicines such as aspirin and ibuprofen. These medicines can thin your blood. Do not take these medicines before your procedure if your health care provider instructs you not to.  Do not take any new dietary supplements or medicines without asking your health care provider first.  Plan to have someone take you home after the procedure. What happens during the procedure?  You may need to remove your   clothing and dress in an open-back gown.  The procedure will be done while you are lying on an X-ray table. You will most likely be asked to lie on your stomach, but you may be asked to lie in a different position if an injection will be made in your neck.  Machines will be used to monitor your oxygen levels, heart rate, and blood pressure.  If an injection will be made in your neck, an IV tube will be inserted into one of your veins. Fluids and medicine will flow directly into your body through the IV tube.  The area over the facet joint where the injection will be made will be cleaned with soap. The surrounding skin  will be covered with clean drapes.  A numbing medicine (local anesthetic) will be applied to your skin. Your skin may sting or burn for a moment.  A video X-ray machine (fluoroscopy) will be used to locate the joint. In some cases, a CT scan may be used.  A contrast dye may be injected into the facet joint area to help locate the joint.  When the joint is located, an anesthetic will be injected into the joint through the needle.  Your health care provider will ask you whether you feel pain relief. If you do feel relief, a steroid may be injected to provide pain relief for a longer period of time. If you do not feel relief or feel only partial relief, additional injections of an anesthetic may be made in other facet joints.  The needle will be removed.  Your skin will be cleaned.  A bandage (dressing) will be applied over each injection site. The procedure may vary among health care providers and hospitals. What happens after the procedure?  You will be observed for 15-30 minutes before being allowed to go home. This information is not intended to replace advice given to you by your health care provider. Make sure you discuss any questions you have with your health care provider. Document Released: 07/27/2006 Document Revised: 04/08/2015 Document Reviewed: 12/01/2014 [employer] Interactive Patient Education  2018 [employer]  Facet Joint Block, Care After Refer to this sheet in the next few weeks. These instructions provide you with information about caring for yourself after your procedure. Your health care provider may also give you more specific instructions. Your treatment has been planned according to current medical practices, but problems sometimes occur. Call your health care provider if you have any problems or questions after your procedure. What can I expect after the procedure? After the procedure, it is common to have:  Some tenderness over the injection sites for 2 days  after the procedure.  A temporary increase in blood sugar if you have diabetes.  Follow these instructions at home:  Keep track of the amount of pain relief you feel and how long it lasts.  Take over-the-counter and prescription medicines only as told by your health care provider. You may need to limit pain medicine within the first 4-6 hours after the procedure.  Remove your bandages (dressings) the morning after the procedure.  For the first 24 hours after the procedure: ? Do not apply heat near or over the injection sites. ? Do not take a bath or soak in water, such as in a pool or lake. ? Do not drive or operate heavy machinery unless approved by your health care provider. ? Avoid activities that require a lot of energy.  If the injection site is tender, try applying ice   to the area. To do this: ? Put ice in a plastic bag. ? Place a towel between your skin and the bag. ? Leave the ice on for 20 minutes, 2-3 times a day.  Keep all follow-up visits as told by your health care provider. This is important. Contact a health care provider if:  Fluid is coming from an injection site.  There is significant bleeding or swelling at an injection site.  You have diabetes and your blood sugar is above 180 mg/dL. Get help right away if:  You have a fever.  You have worsening pain or swelling around an injection site.  There are red streaks around an injection site.  You develop severe pain that is not controlled by your medicines.  You develop a headache, stiff neck, nausea, or vomiting.  Your eyes become very sensitive to light.  You have weakness, paralysis, or tingling in your arms or legs that was not present before the procedure.  You have difficulty urinating or breathing. This information is not intended to replace advice given to you by your health care provider. Make sure you discuss any questions you have with your health care provider. Document Released: 02/22/2012  Document Revised: 07/22/2015 Document Reviewed: 12/01/2014 [employer] Interactive Patient Education  2018 [employer]  

## 2016-11-15 NOTE — Progress Notes (Signed)
Patient's Name: Kristi [name]  MRN: [id number]  Referring Provider: Lorelee Market, MD  DOB: 04-Jan-1941  PCP: Lorelee Market, MD  DOS: 11/15/2016  Note by: Gaspar Cola, MD  Service setting: Ambulatory outpatient  Specialty: Interventional Pain Management  Patient type: Established  Location: [facility] (AMB) Pain Management Facility  Visit type: Interventional Procedure   Primary Reason for Visit: Interventional Pain Management Treatment. CC: Back Pain (lower)  Procedure:  Anesthesia, Analgesia, Anxiolysis:  Type: Diagnostic Medial Branch Facet Block Region: Lumbar Level: L2, L3, L4, L5, & S1 Medial Branch Level(s) Laterality: Bilateral  Type: Local Anesthesia with Moderate (Conscious) Sedation Local Anesthetic: Lidocaine 1% Route: Intravenous (IV) IV Access: Secured Sedation: Meaningful verbal contact was maintained at all times during the procedure  Indication(s): Analgesia and Anxiety  Indications: 1. Lumbar facet syndrome (Bilateral) (R>L)   2. Lumbar spondylosis   3. Chronic low back pain (Location of Secondary source of pain) (Bilateral) (R>L)    Pain Score: Pre-procedure: 5 /10 Post-procedure: 0-No pain/10  Pre-op Assessment:  Kristi [name] is a 76 y.o. (year old), female patient, seen today for interventional treatment. She  has a past surgical history that includes Abdominal hysterectomy; Coronary stent placement; Coronary artery bypass graft; and s/p pacer insertion. Kristi [name] has a current medication list which includes the following prescription(s): aspirin ec, carvedilol, cholecalciferol, clonazepam, docusate sodium, furosemide, gabapentin, lamotrigine, levothyroxine, linaclotide, loratadine, multiple vitamins-minerals, nitroglycerin, omeprazole, potassium chloride sa, rosuvastatin, sertraline, thiothixene, trazodone, and vitamin b-12, and the following Facility-Administered Medications: fentanyl and midazolam. Her primarily concern today is the Back Pain  (lower)  Initial Vital Signs: Blood pressure 105/70, pulse 81, temperature 98.2 F (36.8 C), temperature source Oral, resp. rate 14, height 5\' 6"  (1.676 m), weight 145 lb (65.8 kg), SpO2 96 %. BMI: Estimated body mass index is 23.4 kg/m as calculated from the following:   Height as of this encounter: 5\' 6"  (1.676 m).   Weight as of this encounter: 145 lb (65.8 kg).  Risk Assessment: Allergies: Reviewed. She is allergic to penicillin g.  Allergy Precautions: None required Coagulopathies: Reviewed. None identified.  Blood-thinner therapy: None at this time Active Infection(s): Reviewed. None identified. Kristi [name] is afebrile  Site Confirmation: Kristi [name] was asked to confirm the procedure and laterality before marking the site Procedure checklist: Completed Consent: Before the procedure and under the influence of no sedative(s), amnesic(s), or anxiolytics, the patient was informed of the treatment options, risks and possible complications. To fulfill our ethical and legal obligations, as recommended by the American Medical Association's Code of Ethics, I have informed the patient of my clinical impression; the nature and purpose of the treatment or procedure; the risks, benefits, and possible complications of the intervention; the alternatives, including doing nothing; the risk(s) and benefit(s) of the alternative treatment(s) or procedure(s); and the risk(s) and benefit(s) of doing nothing. The patient was provided information about the general risks and possible complications associated with the procedure. These may include, but are not limited to: failure to achieve desired goals, infection, bleeding, organ or nerve damage, allergic reactions, paralysis, and death. In addition, the patient was informed of those risks and complications associated to Spine-related procedures, such as failure to decrease pain; infection (i.e.: Meningitis, epidural or intraspinal abscess); bleeding (i.e.:  epidural hematoma, subarachnoid hemorrhage, or any other type of intraspinal or peri-dural bleeding); organ or nerve damage (i.e.: Any type of peripheral nerve, nerve root, or spinal cord injury) with subsequent damage to sensory, motor, and/or autonomic systems, resulting in permanent  pain, numbness, and/or weakness of one or several areas of the body; allergic reactions; (i.e.: anaphylactic reaction); and/or death. Furthermore, the patient was informed of those risks and complications associated with the medications. These include, but are not limited to: allergic reactions (i.e.: anaphylactic or anaphylactoid reaction(s)); adrenal axis suppression; blood sugar elevation that in diabetics may result in ketoacidosis or comma; water retention that in patients with history of congestive heart failure may result in shortness of breath, pulmonary edema, and decompensation with resultant heart failure; weight gain; swelling or edema; medication-induced neural toxicity; particulate matter embolism and blood vessel occlusion with resultant organ, and/or nervous system infarction; and/or aseptic necrosis of one or more joints. Finally, the patient was informed that Medicine is not an exact science; therefore, there is also the possibility of unforeseen or unpredictable risks and/or possible complications that may result in a catastrophic outcome. The patient indicated having understood very clearly. We have given the patient no guarantees and we have made no promises. Enough time was given to the patient to ask questions, all of which were answered to the patient's satisfaction. Kristi [name] has indicated that she wanted to continue with the procedure. Attestation: I, the ordering provider, attest that I have discussed with the patient the benefits, risks, side-effects, alternatives, likelihood of achieving goals, and potential problems during recovery for the procedure that I have provided informed consent. Date:  11/15/2016; Time: 9:36 AM  Pre-Procedure Preparation:  Monitoring: As per clinic protocol. Respiration, ETCO2, SpO2, BP, heart rate and rhythm monitor placed and checked for adequate function Safety Precautions: Patient was assessed for positional comfort and pressure points before starting the procedure. Time-out: I initiated and conducted the "Time-out" before starting the procedure, as per protocol. The patient was asked to participate by confirming the accuracy of the "Time Out" information. Verification of the correct person, site, and procedure were performed and confirmed by me, the nursing staff, and the patient. "Time-out" conducted as per Joint Commission's Universal Protocol (UP.01.01.01). "Time-out" Date & Time: 11/15/2016; 1104 hrs.  Description of Procedure Process:   Position: Prone Target Area: For Lumbar Facet blocks, the target is the groove formed by the junction of the transverse process and superior articular process. For the L5 dorsal ramus, the target is the notch between superior articular process and sacral ala. For the S1 dorsal ramus, the target is the superior and lateral edge of the posterior S1 Sacral foramen. Approach: Paramedial approach. Area Prepped: Entire Posterior Lumbosacral Region Prepping solution: ChloraPrep (2% chlorhexidine gluconate and 70% isopropyl alcohol) Safety Precautions: Aspiration looking for blood return was conducted prior to all injections. At no point did we inject any substances, as a needle was being advanced. No attempts were made at seeking any paresthesias. Safe injection practices and needle disposal techniques used. Medications properly checked for expiration dates. SDV (single dose vial) medications used. Description of the Procedure: Protocol guidelines were followed. The patient was placed in position over the fluoroscopy table. The target area was identified and the area prepped in the usual manner. Skin desensitized using vapocoolant  spray. Skin & deeper tissues infiltrated with local anesthetic. Appropriate amount of time allowed to pass for local anesthetics to take effect. The procedure needle was introduced through the skin, ipsilateral to the reported pain, and advanced to the target area. Employing the "Medial Branch Technique", the needles were advanced to the angle made by the superior and medial portion of the transverse process, and the lateral and inferior portion of the superior articulating process  of the targeted vertebral bodies. This area is known as "Burton's Eye" or the "Eye of the [place] Dog". A procedure needle was introduced through the skin, and this time advanced to the angle made by the superior and medial border of the sacral ala, and the lateral border of the S1 vertebral body. This last needle was later repositioned at the superior and lateral border of the posterior S1 foramen. Negative aspiration confirmed. Solution injected in intermittent fashion, asking for systemic symptoms every 0.5cc of injectate. The needles were then removed and the area cleansed, making sure to leave some of the prepping solution back to take advantage of its long term bactericidal properties.   Illustration of the posterior view of the lumbar spine and the posterior neural structures. Laminae of L2 through S1 are labeled. DPRL5, dorsal primary ramus of L5; DPRS1, dorsal primary ramus of S1; DPR3, dorsal primary ramus of L3; FJ, facet (zygapophyseal) joint L3-L4; I, inferior articular process of L4; LB1, lateral branch of dorsal primary ramus of L1; IAB, inferior articular branches from L3 medial branch (supplies L4-L5 facet joint); IBP, intermediate branch plexus; MB3, medial branch of dorsal primary ramus of L3; NR3, third lumbar nerve root; S, superior articular process of L5; SAB, superior articular branches from L4 (supplies L4-5 facet joint also); TP3, transverse process of L3.  Vitals:   11/15/16 1114 11/15/16 1124 11/15/16  1134 11/15/16 1144  BP: 119/79 130/84 123/80 120/72  Pulse:      Resp: 17 18 14 18   Temp:  (!) 96.6 F (35.9 C)    TempSrc:      SpO2: 100% 100% 99% 100%  Weight:      Height:        Start Time: 1105 hrs. End Time: 1113 hrs. Materials:  Needle(s) Type: Regular needle Gauge: 22G Length: 3.5-in Medication(s): We administered lactated ringers, midazolam, fentaNYL, lidocaine, triamcinolone acetonide, ropivacaine (PF) 2 mg/mL (0.2%), triamcinolone acetonide, and ropivacaine (PF) 2 mg/mL (0.2%). Please see chart orders for dosing details.  Imaging Guidance (Spinal):  Type of Imaging Technique: Fluoroscopy Guidance (Spinal) Indication(s): Assistance in needle guidance and placement for procedures requiring needle placement in or near specific anatomical locations not easily accessible without such assistance. Exposure Time: Please see nurses notes. Contrast: None used. Fluoroscopic Guidance: I was personally present during the use of fluoroscopy. "Tunnel Vision Technique" used to obtain the best possible view of the target area. Parallax error corrected before commencing the procedure. "Direction-depth-direction" technique used to introduce the needle under continuous pulsed fluoroscopy. Once target was reached, antero-posterior, oblique, and lateral fluoroscopic projection used confirm needle placement in all planes. Images permanently stored in EMR. Interpretation: No contrast injected. I personally interpreted the imaging intraoperatively. Adequate needle placement confirmed in multiple planes. Permanent images saved into the patient's record.  Antibiotic Prophylaxis:  Indication(s): None identified Antibiotic given: None  Post-operative Assessment:  EBL: None Complications: No immediate post-treatment complications observed by team, or reported by patient. Note: The patient tolerated the entire procedure well. A repeat set of vitals were taken after the procedure and the patient was  kept under observation following institutional policy, for this type of procedure. Post-procedural neurological assessment was performed, showing return to baseline, prior to discharge. The patient was provided with post-procedure discharge instructions, including a section on how to identify potential problems. Should any problems arise concerning this procedure, the patient was given instructions to immediately contact us, at any time, without hesitation. In any case, we plan to contact the patient by  telephone for a follow-up status report regarding this interventional procedure. Comments:  No additional relevant information.  Plan of Care  Disposition: Discharge home  Discharge Date & Time: 11/15/2016; 1145 hrs.   Physician-requested Follow-up:  Return for post-procedure eval by Dr. Dossie Arbour in 2 weeks.  Future Appointments Date Time Provider [place]  12/07/2016 1:45 PM Milinda Pointer, MD [facility] None    Imaging Orders     DG C-Arm 1-60 Min-No Report  Procedure Orders     LUMBAR FACET(MEDIAL BRANCH NERVE BLOCK) MBNB  Medications ordered for procedure: Meds ordered this encounter  Medications  . lactated ringers infusion 1,000 mL  . midazolam (VERSED) 5 MG/5ML injection 1-2 mg    Make sure Flumazenil is available in the pyxis when using this medication. If oversedation occurs, administer 0.2 mg IV over 15 sec. If after 45 sec no response, administer 0.2 mg again over 1 min; may repeat at 1 min intervals; not to exceed 4 doses (1 mg)  . fentaNYL (SUBLIMAZE) injection 25-50 mcg    Make sure Narcan is available in the pyxis when using this medication. In the event of respiratory depression (RR< 8/min): Titrate NARCAN (naloxone) in increments of 0.1 to 0.2 mg IV at 2-3 minute intervals, until desired degree of reversal.  . lidocaine (XYLOCAINE) 2 % (with pres) injection 200 mg  . triamcinolone acetonide (KENALOG-40) injection 40 mg  . ropivacaine (PF) 2 mg/mL (0.2%)  (NAROPIN) injection 9 mL  . triamcinolone acetonide (KENALOG-40) injection 40 mg  . ropivacaine (PF) 2 mg/mL (0.2%) (NAROPIN) injection 9 mL   Medications administered: We administered lactated ringers, midazolam, fentaNYL, lidocaine, triamcinolone acetonide, ropivacaine (PF) 2 mg/mL (0.2%), triamcinolone acetonide, and ropivacaine (PF) 2 mg/mL (0.2%).  See the medical record for exact dosing, route, and time of administration.  New Prescriptions   No medications on file   Primary Care Physician: Lorelee Market, MD Location: [facility] Note by: Gaspar Cola, MD Date: 11/15/2016; Time: 12:34 PM  Disclaimer:  Medicine is not an exact science. The only guarantee in medicine is that nothing is guaranteed. It is important to note that the decision to proceed with this intervention was based on the information collected from the patient. The Data and conclusions were drawn from the patient's questionnaire, the interview, and the physical examination. Because the information was provided in large part by the patient, it cannot be guaranteed that it has not been purposely or unconsciously manipulated. Every effort has been made to obtain as much relevant data as possible for this evaluation. It is important to note that the conclusions that lead to this procedure are derived in large part from the available data. Always take into account that the treatment will also be dependent on availability of resources and existing treatment guidelines, considered by other Pain Management Practitioners as being common knowledge and practice, at the time of the intervention. For Medico-Legal purposes, it is also important to point out that variation in procedural techniques and pharmacological choices are the acceptable norm. The indications, contraindications, technique, and results of the above procedure should only be interpreted and judged by a Board-Certified Interventional  Pain Specialist with extensive familiarity and expertise in the same exact procedure and technique.

## 2016-11-16 ENCOUNTER — Telehealth: Payer: Self-pay | Admitting: *Deleted

## 2016-11-16 NOTE — Telephone Encounter (Signed)
No problems post procedure. 

## 2016-11-17 ENCOUNTER — Observation Stay: Payer: Medicare Other

## 2016-11-17 ENCOUNTER — Emergency Department: Payer: Medicare Other

## 2016-11-17 ENCOUNTER — Observation Stay (HOSPITAL_BASED_OUTPATIENT_CLINIC_OR_DEPARTMENT_OTHER)
Admit: 2016-11-17 | Discharge: 2016-11-17 | Disposition: A | Discharge status: Home / Self Care | Payer: Medicare Other | Attending: Internal Medicine | Admitting: Internal Medicine

## 2016-11-17 ENCOUNTER — Encounter: Payer: Self-pay | Admitting: Emergency Medicine

## 2016-11-17 ENCOUNTER — Observation Stay
Admission: EM | Admit: 2016-11-17 | Discharge: 2016-11-17 | Disposition: A | Discharge status: Home / Self Care | Payer: Medicare Other | Attending: Specialist | Admitting: Specialist

## 2016-11-17 DIAGNOSIS — E039 Hypothyroidism, unspecified: Secondary | ICD-10-CM | POA: Diagnosis not present

## 2016-11-17 DIAGNOSIS — N3000 Acute cystitis without hematuria: Secondary | ICD-10-CM

## 2016-11-17 DIAGNOSIS — I361 Nonrheumatic tricuspid (valve) insufficiency: Secondary | ICD-10-CM

## 2016-11-17 DIAGNOSIS — I252 Old myocardial infarction: Secondary | ICD-10-CM | POA: Diagnosis not present

## 2016-11-17 DIAGNOSIS — G20 Parkinson's disease: Secondary | ICD-10-CM | POA: Insufficient documentation

## 2016-11-17 DIAGNOSIS — E785 Hyperlipidemia, unspecified: Secondary | ICD-10-CM | POA: Insufficient documentation

## 2016-11-17 DIAGNOSIS — R296 Repeated falls: Secondary | ICD-10-CM | POA: Insufficient documentation

## 2016-11-17 DIAGNOSIS — N390 Urinary tract infection, site not specified: Secondary | ICD-10-CM | POA: Diagnosis not present

## 2016-11-17 DIAGNOSIS — I639 Cerebral infarction, unspecified: Secondary | ICD-10-CM | POA: Diagnosis not present

## 2016-11-17 DIAGNOSIS — I10 Essential (primary) hypertension: Secondary | ICD-10-CM | POA: Diagnosis not present

## 2016-11-17 DIAGNOSIS — G894 Chronic pain syndrome: Secondary | ICD-10-CM | POA: Insufficient documentation

## 2016-11-17 DIAGNOSIS — F329 Major depressive disorder, single episode, unspecified: Secondary | ICD-10-CM | POA: Insufficient documentation

## 2016-11-17 DIAGNOSIS — W19XXXA Unspecified fall, initial encounter: Secondary | ICD-10-CM | POA: Insufficient documentation

## 2016-11-17 DIAGNOSIS — G459 Transient cerebral ischemic attack, unspecified: Secondary | ICD-10-CM

## 2016-11-17 DIAGNOSIS — I351 Nonrheumatic aortic (valve) insufficiency: Secondary | ICD-10-CM | POA: Diagnosis not present

## 2016-11-17 DIAGNOSIS — Z7982 Long term (current) use of aspirin: Secondary | ICD-10-CM | POA: Diagnosis not present

## 2016-11-17 DIAGNOSIS — Z880 Allergy status to penicillin: Secondary | ICD-10-CM | POA: Diagnosis not present

## 2016-11-17 DIAGNOSIS — R29810 Facial weakness: Secondary | ICD-10-CM | POA: Insufficient documentation

## 2016-11-17 DIAGNOSIS — I340 Nonrheumatic mitral (valve) insufficiency: Secondary | ICD-10-CM | POA: Diagnosis not present

## 2016-11-17 DIAGNOSIS — F419 Anxiety disorder, unspecified: Secondary | ICD-10-CM | POA: Diagnosis not present

## 2016-11-17 DIAGNOSIS — Z79899 Other long term (current) drug therapy: Secondary | ICD-10-CM | POA: Diagnosis not present

## 2016-11-17 DIAGNOSIS — I2510 Atherosclerotic heart disease of native coronary artery without angina pectoris: Secondary | ICD-10-CM | POA: Diagnosis not present

## 2016-11-17 DIAGNOSIS — Z951 Presence of aortocoronary bypass graft: Secondary | ICD-10-CM | POA: Diagnosis not present

## 2016-11-17 DIAGNOSIS — Z950 Presence of cardiac pacemaker: Secondary | ICD-10-CM | POA: Diagnosis not present

## 2016-11-17 LAB — CBC
HCT: 33.5 % — ABNORMAL LOW (ref 35.0–47.0)
Hemoglobin: 11.4 g/dL — ABNORMAL LOW (ref 12.0–16.0)
MCH: 31.7 pg (ref 26.0–34.0)
MCHC: 34.1 g/dL (ref 32.0–36.0)
MCV: 92.8 fL (ref 80.0–100.0)
Platelets: 106 10*3/uL — ABNORMAL LOW (ref 150–440)
RBC: 3.61 MIL/uL — ABNORMAL LOW (ref 3.80–5.20)
RDW: 15.6 % — ABNORMAL HIGH (ref 11.5–14.5)
WBC: 8.4 10*3/uL (ref 3.6–11.0)

## 2016-11-17 LAB — COMPREHENSIVE METABOLIC PANEL
ALT: 19 U/L (ref 14–54)
AST: 26 U/L (ref 15–41)
Albumin: 3.9 g/dL (ref 3.5–5.0)
Alkaline Phosphatase: 60 U/L (ref 38–126)
Anion gap: 8 (ref 5–15)
BUN: 24 mg/dL — ABNORMAL HIGH (ref 6–20)
CO2: 29 mmol/L (ref 22–32)
Calcium: 9.3 mg/dL (ref 8.9–10.3)
Chloride: 101 mmol/L (ref 101–111)
Creatinine, Ser: 1.04 mg/dL — ABNORMAL HIGH (ref 0.44–1.00)
GFR calc Af Amer: 58 mL/min — ABNORMAL LOW (ref >60)
GFR calc non Af Amer: 50 mL/min — ABNORMAL LOW (ref >60)
Glucose, Bld: 164 mg/dL — ABNORMAL HIGH (ref 65–99)
Potassium: 4 mmol/L (ref 3.5–5.1)
Sodium: 138 mmol/L (ref 135–145)
Total Bilirubin: 0.7 mg/dL (ref 0.3–1.2)
Total Protein: 6.6 g/dL (ref 6.5–8.1)

## 2016-11-17 LAB — URINALYSIS, COMPLETE (UACMP) WITH MICROSCOPIC
Bilirubin Urine: NEGATIVE
Glucose, UA: NEGATIVE mg/dL
Ketones, ur: NEGATIVE mg/dL
Nitrite: NEGATIVE
Protein, ur: NEGATIVE mg/dL
Specific Gravity, Urine: 1.005 (ref 1.005–1.030)
pH: 5 (ref 5.0–8.0)

## 2016-11-17 LAB — LIPID PANEL
Cholesterol: 173 mg/dL (ref 0–200)
HDL: 69 mg/dL (ref >40)
LDL Cholesterol: 85 mg/dL (ref 0–99)
Total CHOL/HDL Ratio: 2.5 RATIO
Triglycerides: 94 mg/dL (ref <150)
VLDL: 19 mg/dL (ref 0–40)

## 2016-11-17 LAB — DIFFERENTIAL
Basophils Absolute: 0 10*3/uL (ref 0–0.1)
Basophils Relative: 0 %
Eosinophils Absolute: 0 10*3/uL (ref 0–0.7)
Eosinophils Relative: 0 %
Lymphocytes Relative: 12 %
Lymphs Abs: 1 10*3/uL (ref 1.0–3.6)
Monocytes Absolute: 0.4 10*3/uL (ref 0.2–0.9)
Monocytes Relative: 4 %
Neutro Abs: 7.1 10*3/uL — ABNORMAL HIGH (ref 1.4–6.5)
Neutrophils Relative %: 84 %

## 2016-11-17 LAB — PROTIME-INR
INR: 0.98
Prothrombin Time: 12.9 seconds (ref 11.4–15.2)

## 2016-11-17 LAB — ECHOCARDIOGRAM COMPLETE
Height: 66 in
Weight: 2320 oz

## 2016-11-17 LAB — TROPONIN I: Troponin I: <0.03 ng/mL (ref <0.03)

## 2016-11-17 LAB — ETHANOL: Alcohol, Ethyl (B): <5 mg/dL (ref <5)

## 2016-11-17 MED ORDER — ENOXAPARIN SODIUM 40 MG/0.4ML ~~LOC~~ SOLN: 40.0000 mg | SUBCUTANEOUS | Status: DC

## 2016-11-17 MED ORDER — DEXTROSE 5 % IV SOLN
1.0000 g | Freq: Once | INTRAVENOUS | Status: AC
  Administered 2016-11-17: 1 g via INTRAVENOUS

## 2016-11-17 MED ORDER — DEXTROSE 5 % IV SOLN
INTRAVENOUS | Status: AC
  Filled 2016-11-17: qty 10

## 2016-11-17 MED ORDER — LAMOTRIGINE 100 MG PO TABS
100.0000 mg | ORAL_TABLET | Freq: Every day | ORAL | Status: DC
Start: 2016-11-17 — End: 2016-11-17
  Administered 2016-11-17: 08:00:00 100 mg via ORAL
  Filled 2016-11-17: qty 1

## 2016-11-17 MED ORDER — VITAMIN B-12 1000 MCG PO TABS
500.0000 ug | ORAL_TABLET | Freq: Every day | ORAL | Status: DC
  Administered 2016-11-17: 08:00:00 500 ug via ORAL
  Filled 2016-11-17: qty 1

## 2016-11-17 MED ORDER — CARVEDILOL 3.125 MG PO TABS
6.2500 mg | ORAL_TABLET | Freq: Two times a day (BID) | ORAL | Status: DC
  Administered 2016-11-17: 6.25 mg via ORAL
  Filled 2016-11-17: qty 2

## 2016-11-17 MED ORDER — SERTRALINE HCL 50 MG PO TABS: 50.0000 mg | ORAL_TABLET | Freq: Every day | ORAL | Status: DC

## 2016-11-17 MED ORDER — NITROGLYCERIN 0.4 MG SL SUBL: 0.4000 mg | SUBLINGUAL_TABLET | SUBLINGUAL | Status: DC | PRN

## 2016-11-17 MED ORDER — CEFUROXIME AXETIL 250 MG PO TABS: 250.0000 mg | ORAL_TABLET | Freq: Two times a day (BID) | ORAL | 0 refills | Status: AC

## 2016-11-17 MED ORDER — CLOPIDOGREL BISULFATE 75 MG PO TABS: 75.0000 mg | ORAL_TABLET | Freq: Every day | ORAL | 1 refills | Status: DC

## 2016-11-17 MED ORDER — ACETAMINOPHEN 325 MG PO TABS
650.0000 mg | ORAL_TABLET | ORAL | Status: DC | PRN
  Administered 2016-11-17: 15:00:00 650 mg via ORAL
  Filled 2016-11-17: qty 2

## 2016-11-17 MED ORDER — LEVOTHYROXINE SODIUM 100 MCG PO TABS
100.0000 ug | ORAL_TABLET | Freq: Every day | ORAL | Status: DC
  Administered 2016-11-17: 08:00:00 100 ug via ORAL
  Filled 2016-11-17: qty 1

## 2016-11-17 MED ORDER — ASPIRIN 300 MG RE SUPP: 300.0000 mg | Freq: Every day | RECTAL | Status: DC

## 2016-11-17 MED ORDER — SERTRALINE HCL 50 MG PO TABS
100.0000 mg | ORAL_TABLET | Freq: Every morning | ORAL | Status: DC
  Administered 2016-11-17: 08:00:00 100 mg via ORAL
  Filled 2016-11-17: qty 2

## 2016-11-17 MED ORDER — ACETAMINOPHEN 160 MG/5ML PO SOLN
650.0000 mg | ORAL | Status: DC | PRN
  Filled 2016-11-17: qty 20.3

## 2016-11-17 MED ORDER — ROSUVASTATIN CALCIUM 20 MG PO TABS
20.0000 mg | ORAL_TABLET | Freq: Every day | ORAL | Status: DC
  Administered 2016-11-17: 20 mg via ORAL
  Filled 2016-11-17: qty 1

## 2016-11-17 MED ORDER — GABAPENTIN 300 MG PO CAPS
300.0000 mg | ORAL_CAPSULE | Freq: Three times a day (TID) | ORAL | Status: DC
  Administered 2016-11-17: 08:00:00 300 mg via ORAL
  Filled 2016-11-17: qty 1

## 2016-11-17 MED ORDER — PANTOPRAZOLE SODIUM 40 MG PO TBEC
40.0000 mg | DELAYED_RELEASE_TABLET | Freq: Every day | ORAL | Status: DC
  Administered 2016-11-17: 08:00:00 40 mg via ORAL
  Filled 2016-11-17: qty 1

## 2016-11-17 MED ORDER — DOCUSATE SODIUM 100 MG PO CAPS
100.0000 mg | ORAL_CAPSULE | Freq: Two times a day (BID) | ORAL | Status: DC
  Administered 2016-11-17: 100 mg via ORAL
  Filled 2016-11-17: qty 1

## 2016-11-17 MED ORDER — VITAMIN D 1000 UNITS PO TABS
1000.0000 [IU] | ORAL_TABLET | Freq: Every day | ORAL | Status: DC
  Administered 2016-11-17: 1000 [IU] via ORAL
  Filled 2016-11-17: qty 1

## 2016-11-17 MED ORDER — TRAZODONE HCL 50 MG PO TABS: 50.0000 mg | ORAL_TABLET | Freq: Every evening | ORAL | Status: DC | PRN

## 2016-11-17 MED ORDER — LINACLOTIDE 145 MCG PO CAPS
145.0000 ug | ORAL_CAPSULE | Freq: Every day | ORAL | Status: DC
  Administered 2016-11-17: 08:00:00 145 ug via ORAL
  Filled 2016-11-17: qty 1

## 2016-11-17 MED ORDER — STROKE: EARLY STAGES OF RECOVERY BOOK
Freq: Once | Status: AC
  Administered 2016-11-17: 05:00:00

## 2016-11-17 MED ORDER — CLONAZEPAM 0.5 MG PO TABS
0.5000 mg | ORAL_TABLET | Freq: Two times a day (BID) | ORAL | Status: DC
Start: 2016-11-17 — End: 2016-11-17
  Administered 2016-11-17: 0.5 mg via ORAL
  Filled 2016-11-17: qty 1

## 2016-11-17 MED ORDER — ASPIRIN 325 MG PO TABS
325.0000 mg | ORAL_TABLET | Freq: Every day | ORAL | Status: DC
  Administered 2016-11-17: 08:00:00 325 mg via ORAL
  Filled 2016-11-17: qty 1

## 2016-11-17 MED ORDER — CEFUROXIME AXETIL 250 MG PO TABS: 250.0000 mg | ORAL_TABLET | Freq: Two times a day (BID) | ORAL | Status: DC

## 2016-11-17 MED ORDER — ACETAMINOPHEN 650 MG RE SUPP: 650.0000 mg | RECTAL | Status: DC | PRN

## 2016-11-17 NOTE — Progress Notes (Signed)
*  PRELIMINARY RESULTS* Echocardiogram 2D Echocardiogram has been performed.  Kristi [name] 11/17/2016, 11:28 AM

## 2016-11-17 NOTE — Progress Notes (Signed)
SLP Cancellation Note  Patient Details Name: Kristi [name] MRN: [id number] DOB: 09/24/1940   Cancelled treatment:       Reason Eval/Treat Not Completed: SLP screened, no needs identified, will sign off (Chart reviewed; NSG consulted; met w/ pt)  Pt currently on regular diet and denied any difficulty swallowing. She tolerates swallowing pills w/ water per NSG. Pt conversed at conversational level w/out overt deficits noted; pt denied any speech-language deficits. She was able to indicate wants/needs (named lunch items she preferred). However, she did state she has trouble remembering information spoken to her especially when talking to MD and NSG. She stated this was true when going to doctor's visits with PCP. Pt indicated this has been going on for "several months". Per chart note in ED, Pt has recently received diagnosis of Parkinson Disease on 10/27/2016. With such diagnosis, Cognitive deficits could be expected including memory issues. Unsure if this is related to pt's baseline, but would strongly recommend f/u with neurology as pt stated memory problems have ongoing for "several months". Discuss w/ pt strategy of printing off information, highlighting, and verbally reviewing it w/ her/family. Pt agreed. NSG updated and agreed. Will inform MD of pt's baseline memory issues.   No further skilled ST services indicated as pt appears at her baseline. Recommend potential f/u  outpatient for further cognitive assessment if ordered by neurology. Pt agreed. NSG to reconsult if any change in status.   Carson Barbee, SLP-Graduate Student Carson Barbee 11/17/2016, 10:44 AM   This information has been reviewed and agreed upon by this supervising clinician.  This patient note, response to treatment and overall treatment plan has been reviewed and this clinician agrees with the information provided.  11/17/16, 11:28 AM [phone] Katherine Watson, MS, CCC-SLP 

## 2016-11-17 NOTE — H&P (Signed)
[facility] NAME: Kristi [name]    MR#:  [id number]  DATE OF BIRTH:  [birth year]  DATE OF ADMISSION:  11/17/2016  PRIMARY CARE PHYSICIAN: Lorelee Market, MD   REQUESTING/REFERRING PHYSICIAN:   CHIEF COMPLAINT:   Chief Complaint  Patient presents with  . Fall  . Dizziness    HISTORY OF PRESENT ILLNESS: Kristi [name]  is a 76 y.o. female with a known history of hyperlipidemia, hypertension, hypothyroidism, myocardial infarction spinal stenosis presented to the emergency room for left facial weakness and right leg weakness. She noticed this around  10 PM last night.  Patient had injections to her back for chronic back pain. She felt her right leg was more weak and also had some facial numbness on the left side. She presented to the emergency room and patient was evaluated with a CT head which showed no acute abnormality.Code stroke was called. Tele neuro consultation was done who recommended aspirin and further workup with echocardiogram, carotid ultrasound. Did not recommend any TPA therapy. Hospitalist service was consulted. In the emergency room the left of facial numbness resolved.No prior history of CVA. Patient also felt dizzy and weak.  PAST MEDICAL HISTORY:   Past Medical History:  Diagnosis Date  . Anxiety   . Broken arm    left FA 3/17  . Broken arm    left  . Depression   . Hyperlipidemia   . Hypertension   . Hypothyroid   . Myocardial infarction ([place])   . Neck pain 06/04/2014  . Spinal stenosis     PAST SURGICAL HISTORY: Past Surgical History:  Procedure Laterality Date  . ABDOMINAL HYSTERECTOMY    . CORONARY ARTERY BYPASS GRAFT    . CORONARY STENT PLACEMENT    . s/p pacer insertion      SOCIAL HISTORY:  Social History  Substance Use Topics  . Smoking status: Never Smoker  . Smokeless tobacco: Never Used  . Alcohol use No    FAMILY HISTORY:  Family History  Problem Relation Age of Onset  .  Cancer Mother   . Depression Mother   . Diabetes Mother   . Hypertension Mother   . Heart disease Father     DRUG ALLERGIES:  Allergies  Allergen Reactions  . Penicillin G Swelling and Rash    Has patient had a PCN reaction causing immediate rash, facial/tongue/throat swelling, SOB or lightheadedness with hypotension: Yes Has patient had a PCN reaction causing severe rash involving mucus membranes or skin necrosis: No Has patient had a PCN reaction that required hospitalization: No Has patient had a PCN reaction occurring within the last 10 years: No If all of the above answers are "NO", then may proceed with Cephalosporin use.     REVIEW OF SYSTEMS:   CONSTITUTIONAL: No fever, has weakness.  EYES: No blurred or double vision.  EARS, NOSE, AND THROAT: No tinnitus or ear pain.  RESPIRATORY: No cough, shortness of breath, wheezing or hemoptysis.  CARDIOVASCULAR: No chest pain, orthopnea, edema.  GASTROINTESTINAL: No nausea, vomiting, diarrhea or abdominal pain.  GENITOURINARY: No dysuria, hematuria.  ENDOCRINE: No polyuria, nocturia,  HEMATOLOGY: No anemia, easy bruising or bleeding SKIN: No rash or lesion. MUSCULOSKELETAL: No joint pain or arthritis.   NEUROLOGIC: No tingling,  has numbness, weakness of left side of face. Has right leg weakness. PSYCHIATRY: No anxiety or depression.   MEDICATIONS AT HOME:  Prior to Admission medications   Medication Sig Start Date End  Date Taking? Authorizing Provider  cholecalciferol (VITAMIN D) 1000 UNITS tablet Take 1,000 Units by mouth daily. Reported on 07/08/2015   Yes [provider]  clonazePAM (KLONOPIN) 0.5 MG tablet Take 0.5 mg by mouth 2 (two) times daily.   Yes [provider]  docusate sodium (COLACE) 100 MG capsule Take 100 mg by mouth 2 (two) times daily.   Yes [provider]  furosemide (LASIX) 40 MG tablet Take 40 mg by mouth 2 (two) times daily.   Yes [provider]  gabapentin  (NEURONTIN) 300 MG capsule Take 1 capsule (300 mg total) by mouth 3 (three) times daily. Patient taking differently: Take 300 mg by mouth at bedtime.  11/09/16 12/09/16 Yes Vevelyn Francois, NP  lamoTRIgine (LAMICTAL) 100 MG tablet take 100 mg by mouth three times daily   Yes [provider]  levothyroxine (SYNTHROID, LEVOTHROID) 100 MCG tablet Take 100 mcg by mouth daily before breakfast.   Yes [provider]  Linaclotide (LINZESS) 145 MCG CAPS capsule Take 145 mcg by mouth daily.   Yes [provider]  loratadine (CLARITIN) 10 MG tablet Take 10 mg by mouth daily.   Yes [provider]  Multiple Vitamins-Minerals (ALIVE WOMENS ENERGY PO) Take 1 tablet by mouth daily.   Yes [provider]  omeprazole (PRILOSEC) 20 MG capsule Take 20 mg by mouth daily.   Yes [provider]  potassium chloride SA (K-DUR,KLOR-CON) 20 MEQ tablet Take 20 mEq by mouth 2 (two) times daily.  08/18/16  Yes [provider]  rosuvastatin (CRESTOR) 20 MG tablet Take 20 mg by mouth daily.   Yes [provider]  sertraline (ZOLOFT) 100 MG tablet Take 50-100 mg by mouth 2 (two) times daily. Take 100 mg in the am and 50 mg at bedtime.   Yes [provider]  thiothixene (NAVANE) 5 MG capsule Take 5 mg by mouth 2 (two) times daily. 08/09/16  Yes [provider]  traZODone (DESYREL) 50 MG tablet Take 50 mg by mouth at bedtime as needed for sleep.    Yes [provider]  vitamin B-12 (CYANOCOBALAMIN) 500 MCG tablet Take 500 mcg by mouth daily.    Yes [provider]  aspirin EC 81 MG tablet Take 81 mg by mouth daily.    [provider]  carvedilol (COREG) 6.25 MG tablet 2 (two) times daily.  10/10/16   [provider]  nitroGLYCERIN (NITROSTAT) 0.4 MG SL tablet Place 0.4 mg under the tongue every 5 (five) minutes as needed for chest pain.  10/03/16   [provider]      PHYSICAL EXAMINATION:   VITAL  SIGNS: Blood pressure 127/70, pulse 60, temperature 98.1 F (36.7 C), resp. rate (!) 28, height 5\' 6"  (1.676 m), weight 65.8 kg (145 lb), SpO2 98 %.  GENERAL:  76 y.o.-year-old patient lying in the bed with no acute distress.  EYES: Pupils equal, round, reactive to light and accommodation. No scleral icterus. Extraocular muscles intact.  HEENT: Head atraumatic, normocephalic. Oropharynx and nasopharynx clear.  NECK:  Supple, no jugular venous distention. No thyroid enlargement, no tenderness.  LUNGS: Normal breath sounds bilaterally, no wheezing, rales,rhonchi or crepitation. No use of accessory muscles of respiration.  CARDIOVASCULAR: S1, S2 normal. No murmurs, rubs, or gallops.  ABDOMEN: Soft, nontender, nondistended. Bowel sounds present. No organomegaly or mass.  EXTREMITIES: No pedal edema, cyanosis, or clubbing.  NEUROLOGIC: Cranial nerves II through XII are intact. Muscle strength 4/5 in right lower extremity,  5/5 all other extremities. Sensation intact. Gait not checked.  No cerebellar signs noted. PSYCHIATRIC: The patient is alert and oriented x 3.  SKIN: No obvious rash, lesion, or ulcer.   LABORATORY PANEL:   CBC  Recent Labs Lab 11/17/16 0053  WBC 8.4  HGB 11.4*  HCT 33.5*  PLT 106*  MCV 92.8  MCH 31.7  MCHC 34.1  RDW 15.6*  LYMPHSABS 1.0  MONOABS 0.4  EOSABS 0.0  BASOSABS 0.0   ------------------------------------------------------------------------------------------------------------------  Chemistries   Recent Labs Lab 11/17/16 0053  NA 138  K 4.0  CL 101  CO2 29  GLUCOSE 164*  BUN 24*  CREATININE 1.04*  CALCIUM 9.3  AST 26  ALT 19  ALKPHOS 60  BILITOT 0.7   ------------------------------------------------------------------------------------------------------------------ estimated creatinine clearance is 41.7 mL/min (A) (by C-G formula based on SCr of 1.04 mg/dL  (H)). ------------------------------------------------------------------------------------------------------------------ No results for input(s): TSH, T4TOTAL, T3FREE, THYROIDAB in the last 72 hours.  Invalid input(s): FREET3   Coagulation profile  Recent Labs Lab 11/17/16 0053  INR 0.98   ------------------------------------------------------------------------------------------------------------------- No results for input(s): DDIMER in the last 72 hours. -------------------------------------------------------------------------------------------------------------------  Cardiac Enzymes  Recent Labs Lab 11/17/16 0053  TROPONINI <0.03   ------------------------------------------------------------------------------------------------------------------ Invalid input(s): POCBNP  ---------------------------------------------------------------------------------------------------------------  Urinalysis    Component Value Date/Time   COLORURINE YELLOW (A) 11/17/2016 0305   APPEARANCEUR TURBID (A) 11/17/2016 0305   APPEARANCEUR Clear 02/19/2014 2041   LABSPEC 1.005 11/17/2016 0305   LABSPEC 1.003 02/19/2014 2041   PHURINE 5.0 11/17/2016 0305   GLUCOSEU NEGATIVE 11/17/2016 0305   GLUCOSEU Negative 02/19/2014 2041   HGBUR MODERATE (A) 11/17/2016 0305   BILIRUBINUR NEGATIVE 11/17/2016 0305   BILIRUBINUR Negative 02/19/2014 2041   KETONESUR NEGATIVE 11/17/2016 0305   PROTEINUR NEGATIVE 11/17/2016 0305   NITRITE NEGATIVE 11/17/2016 0305   LEUKOCYTESUR LARGE (A) 11/17/2016 0305   LEUKOCYTESUR Trace 02/19/2014 2041     RADIOLOGY: Dg Shoulder Right  Result Date: 11/17/2016 CLINICAL DATA:  Fall, hematoma to back of head EXAM: RIGHT SHOULDER - 2+ VIEW COMPARISON:  None. FINDINGS: Partially visualized sternotomy changes. No acute displaced fracture or dislocation. Mild AC joint and glenohumeral degenerative changes. IMPRESSION: No acute osseous abnormality. Electronically Signed    By: Donavan Foil M.D.   On: 11/17/2016 03:09   Dg C-arm 1-60 Min-no Report  Result Date: 11/15/2016 Fluoroscopy was utilized by the requesting physician.  No radiographic interpretation.   Ct Head Code Stroke Wo Contrast  Result Date: 11/17/2016 CLINICAL DATA:  Code stroke. Dizziness, fall. RIGHT-sided weakness. LEFT facial droop. History of hypertension and hyperlipidemia. EXAM: CT HEAD WITHOUT CONTRAST TECHNIQUE: Contiguous axial images were obtained from the base of the skull through the vertex without intravenous contrast. COMPARISON:  CT HEAD May 20, 2014 FINDINGS: BRAIN: No intraparenchymal hemorrhage, mass effect nor midline shift. The ventricles and sulci are normal for age. Patchy supratentorial white matter hypodensities within normal range for patient's age, though non-specific are most compatible with chronic small vessel ischemic disease. No acute large vascular territory infarcts. No abnormal extra-axial fluid collections. Basal cisterns are patent. VASCULAR: Moderate calcific atherosclerosis of the carotid siphons. SKULL: No skull fracture. No significant scalp soft tissue swelling. SINUSES/ORBITS: The mastoid air-cells and included paranasal sinuses are well-aerated.The included ocular globes and orbital contents are non-suspicious. Status post bilateral ocular lens implants. OTHER: None. ASPECTS [facility] Stroke Program Early CT Score) - Ganglionic level infarction (caudate, lentiform nuclei, internal capsule, insula, M1-M3 cortex): 7 - Supraganglionic infarction (M4-M6 cortex): 3 Total score (0-10 with  10 being normal): 10 IMPRESSION: 1. No acute intracranial process. Negative noncontrast CT HEAD for age. 2. ASPECTS is 10. 3. Critical Value/emergent results were called by telephone at the time of interpretation on 11/17/2016 at 1:15 am to Dr. Marjean Donna , who verbally acknowledged these results. Electronically Signed   By: Elon Alas M.D.   On: 11/17/2016 01:19    EKG: Orders  placed or performed during the hospital encounter of 11/17/16  . ED EKG  . ED EKG  . EKG 12-Lead  . EKG 12-Lead    IMPRESSION AND PLAN: 76 year old female patient with history of hypertension, hyperlipidemia, spinal stenosis, myocardial infarction presented to the emergency room with weakness of the right leg and numbness of the left side of the face. Admitting diagnosis 1. Transient ischemic attack 2.  Hypertension 3. Hyperlipidemia 4. Spinal stenosis Treatment plan Admit patient to observation bed Neurology consultation Aspirin 325 MG daily Neuro check every 4 hourly Carotid ultrasound and echocardiogram  All the records are reviewed and case discussed with ED provider. Management plans discussed with the patient, family and they are in agreement.  CODE STATUS:FULL CODE Code Status History    Date Active Date Inactive Code Status Order ID Comments User Context   09/29/2016  8:02 PM 09/30/2016  4:05 PM Full Code [id number]  Epifanio Lesches, MD ED    Advance Directive Documentation     Most Recent Value  Type of Advance Directive  Healthcare Power of Attorney  Pre-existing out of facility DNR order (yellow form or pink MOST form)  -  "MOST" Form in Place?  -       TOTAL TIME TAKING CARE OF THIS PATIENT: 50 minutes.    Saundra Shelling M.D on 11/17/2016 at 3:34 AM  Between 7am to 6pm - Pager - [phone]  After 6pm go to [email or web address] - password [email or web address] [facility]  [facility]  Office  [phone]  CC: Primary care physician; Lorelee Market, MD

## 2016-11-17 NOTE — Consult Note (Signed)
Referring Physician: Verdell Carmine    Chief Complaint: left facial weakness and right leg weakness  HPI: Kristi [name] is an 76 y.o. female with a history of spinal stenosis s/p multiple steroid injections who reports that she has had more pain and weakness in her right leg weakness.  Has had falls as a consequence.  In treatment had a recent steroid injection.  On the evening prior to presentation also developed more right leg weakness and left facial numbness.  Early the next morning presented for evaluation.  Initial NIHSS of 3.  Patient had a facet block injection on the day prior to presentation.      Date last known well: Date: 11/16/2016 Time last known well: Time: 22:00 tPA Given: No: Outside treatment window  Past Medical History:  Diagnosis Date  . Anxiety   . Broken arm    left FA 3/17  . Broken arm    left  . Depression   . Hyperlipidemia   . Hypertension   . Hypothyroid   . Myocardial infarction ([place])   . Neck pain 06/04/2014  . Spinal stenosis     Past Surgical History:  Procedure Laterality Date  . ABDOMINAL HYSTERECTOMY    . CORONARY ARTERY BYPASS GRAFT    . CORONARY STENT PLACEMENT    . s/p pacer insertion      Family History  Problem Relation Age of Onset  . Cancer Mother   . Depression Mother   . Diabetes Mother   . Hypertension Mother   . Heart disease Father    Social History:  reports that she has never smoked. She has never used smokeless tobacco. She reports that she does not drink alcohol or use drugs.  Allergies:  Allergies  Allergen Reactions  . Penicillin G Swelling and Rash    Has patient had a PCN reaction causing immediate rash, facial/tongue/throat swelling, SOB or lightheadedness with hypotension: Yes Has patient had a PCN reaction causing severe rash involving mucus membranes or skin necrosis: No Has patient had a PCN reaction that required hospitalization: No Has patient had a PCN reaction occurring within the last 10 years: No If all  of the above answers are "NO", then may proceed with Cephalosporin use.     Medications:  I have reviewed the patient's current medications. Prior to Admission:  Vitamin D Klonopin Colace Lasix Neurontin Lamictal Synthroid Linzess Claritin MVI Prilosec K-dur Crestor Zoloft Navane Desyrel Vitamin B12 ASA Coreg NTG    ROS: History obtained from the patient  General ROS: negative for - chills, fatigue, fever, night sweats, weight gain or weight loss Psychological ROS: negative for - behavioral disorder, hallucinations, memory difficulties, mood swings or suicidal ideation Ophthalmic ROS: negative for - blurry vision, double vision, eye pain or loss of vision ENT ROS: negative for - epistaxis, nasal discharge, oral lesions, sore throat, tinnitus or vertigo Allergy and Immunology ROS: negative for - hives or itchy/watery eyes Hematological and Lymphatic ROS: negative for - bleeding problems, bruising or swollen lymph nodes Endocrine ROS: negative for - galactorrhea, hair pattern changes, polydipsia/polyuria or temperature intolerance Respiratory ROS: negative for - cough, hemoptysis, shortness of breath or wheezing Cardiovascular ROS: negative for - chest pain, dyspnea on exertion, edema or irregular heartbeat Gastrointestinal ROS: negative for - abdominal pain, diarrhea, hematemesis, nausea/vomiting or stool incontinence Genito-Urinary ROS: negative for - dysuria, hematuria, incontinence or urinary frequency/urgency Musculoskeletal ROS: back pain Neurological ROS: as noted in HPI Dermatological ROS: negative for rash and skin lesion changes  Physical Examination: Blood pressure (!) 99/50, pulse 66, temperature 97.6 F (36.4 C), temperature source Oral, resp. rate 16, height 5\' 6"  (1.676 m), weight 65.8 kg (145 lb), SpO2 99 %.  HEENT-  Normocephalic, no lesions, without obvious abnormality.  Normal external eye and conjunctiva.  Normal TM's bilaterally.  Normal auditory  canals and external ears. Normal external nose, mucus membranes and septum.  Normal pharynx. Cardiovascular- S1, S2 normal, pulses palpable throughout   Lungs- chest clear, no wheezing, rales, normal symmetric air entry Abdomen- soft, non-tender; bowel sounds normal; no masses,  no organomegaly Extremities- no edema Lymph-no adenopathy palpable Musculoskeletal-no joint tenderness, deformity or swelling Skin-warm and dry, no hyperpigmentation, vitiligo, or suspicious lesions  Neurological Examination   Mental Status: Alert, oriented, thought content appropriate.  Some short term memory issues.  Speech fluent without evidence of aphasia.  Able to follow 3 step commands without difficulty. Cranial Nerves: II: Discs flat bilaterally; Visual fields grossly normal, pupils equal, round, reactive to light and accommodation III,IV, VI: mild left ptosis, extra-ocular motions intact bilaterally V,VII: mild decrease left NLF, facial light touch sensation decreased on the left cheek VIII: hearing normal bilaterally IX,X: gag reflex present XI: bilateral shoulder shrug XII: midline tongue extension Motor: Right : Upper extremity   5/5    Left:     Upper extremity   5/5  Lower extremity   5/5     Lower extremity   5/5 Tone and bulk:normal tone throughout; no atrophy noted Sensory: Pinprick and light touch intact throughout, bilaterally Deep Tendon Reflexes: 2+ and symmetric throughout Plantars: Right: upgoing   Left: upgoing Cerebellar: Normal finger-to-nose and normal heel-to-shin testing bilaterally Gait: not tested due to safety concerns    Laboratory Studies:  Basic Metabolic Panel:  Recent Labs Lab 11/17/16 0053  NA 138  K 4.0  CL 101  CO2 29  GLUCOSE 164*  BUN 24*  CREATININE 1.04*  CALCIUM 9.3    Liver Function Tests:  Recent Labs Lab 11/17/16 0053  AST 26  ALT 19  ALKPHOS 60  BILITOT 0.7  PROT 6.6  ALBUMIN 3.9   No results for input(s): LIPASE, AMYLASE in the  last 168 hours. No results for input(s): AMMONIA in the last 168 hours.  CBC:  Recent Labs Lab 11/17/16 0053  WBC 8.4  NEUTROABS 7.1*  HGB 11.4*  HCT 33.5*  MCV 92.8  PLT 106*    Cardiac Enzymes:  Recent Labs Lab 11/17/16 0053  TROPONINI <0.03    BNP: Invalid input(s): POCBNP  CBG: No results for input(s): GLUCAP in the last 168 hours.  Microbiology: No results found for this or any previous visit.  Coagulation Studies:  Recent Labs  11/17/16 0053  LABPROT 12.9  INR 0.98    Urinalysis:  Recent Labs Lab 11/17/16 0305  COLORURINE YELLOW*  LABSPEC 1.005  PHURINE 5.0  GLUCOSEU NEGATIVE  HGBUR MODERATE*  BILIRUBINUR NEGATIVE  KETONESUR NEGATIVE  PROTEINUR NEGATIVE  NITRITE NEGATIVE  LEUKOCYTESUR LARGE*    Lipid Panel:    Component Value Date/Time   CHOL 173 11/17/2016 0449   TRIG 94 11/17/2016 0449   HDL 69 11/17/2016 0449   CHOLHDL 2.5 11/17/2016 0449   VLDL 19 11/17/2016 0449   LDLCALC 85 11/17/2016 0449    HgbA1C: No results found for: HGBA1C  Urine Drug Screen:  No results found for: LABOPIA, COCAINSCRNUR, LABBENZ, AMPHETMU, THCU, LABBARB  Alcohol Level:  Recent Labs Lab 11/17/16 [place] <5    Other results: EKG: ventricular  paced at 81 bpm.  Imaging: Dg Shoulder Right  Result Date: 11/17/2016 CLINICAL DATA:  Fall, hematoma to back of head EXAM: RIGHT SHOULDER - 2+ VIEW COMPARISON:  None. FINDINGS: Partially visualized sternotomy changes. No acute displaced fracture or dislocation. Mild AC joint and glenohumeral degenerative changes. IMPRESSION: No acute osseous abnormality. Electronically Signed   By: Donavan Foil M.D.   On: 11/17/2016 03:09   US Carotid Bilateral (at [facility] Only)  Result Date: 11/17/2016 CLINICAL DATA:  TIA. EXAM: BILATERAL CAROTID DUPLEX ULTRASOUND TECHNIQUE: Pearline Cables scale imaging, color Doppler and duplex ultrasound were performed of bilateral carotid and vertebral arteries in the neck. Exam was limited  due to patient movement during the exam. COMPARISON:  CT 11/17/2016. FINDINGS: Criteria: Quantification of carotid stenosis is based on velocity parameters that correlate the residual internal carotid diameter with NASCET-based stenosis levels, using the diameter of the distal internal carotid lumen as the denominator for stenosis measurement. The following velocity measurements were obtained: RIGHT ICA:  53/13 cm/sec CCA:  95/09 cm/sec SYSTOLIC ICA/CCA RATIO:  0.9 DIASTOLIC ICA/CCA RATIO:  0.8 ECA:  61 cm/sec LEFT ICA:  74/26 cm/sec CCA:  32/67 cm/sec SYSTOLIC ICA/CCA RATIO:  1.0 DIASTOLIC ICA/CCA RATIO:  1.6 ECA:  43 cm/sec RIGHT CAROTID ARTERY: Mild right carotid bifurcation atherosclerotic vascular disease. No flow limiting stenosis. RIGHT VERTEBRAL ARTERY:  Patent with antegrade flow. LEFT CAROTID ARTERY: Mild left carotid bifurcation proximal ICA atherosclerotic vascular disease. No flow limiting stenosis. LEFT VERTEBRAL ARTERY:  Patent with antegrade flow. IMPRESSION: 1. 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 No flow limiting stenosis. Degree of stenosis less than 50% bilaterally. 2. Vertebrals are patent antegrade flow. Electronically Signed   By: Marcello Moores  Register   On: 11/17/2016 12:02   Ct Head Code Stroke Wo Contrast  Result Date: 11/17/2016 CLINICAL DATA:  Code stroke. Dizziness, fall. RIGHT-sided weakness. LEFT facial droop. History of hypertension and hyperlipidemia. EXAM: CT HEAD WITHOUT CONTRAST TECHNIQUE: Contiguous axial images were obtained from the base of the skull through the vertex without intravenous contrast. COMPARISON:  CT HEAD May 20, 2014 FINDINGS: BRAIN: No intraparenchymal hemorrhage, mass effect nor midline shift. The ventricles and sulci are normal for age. Patchy supratentorial white matter hypodensities within normal range for patient's age, though non-specific are most compatible with chronic small vessel ischemic disease. No acute  large vascular territory infarcts. No abnormal extra-axial fluid collections. Basal cisterns are patent. VASCULAR: Moderate calcific atherosclerosis of the carotid siphons. SKULL: No skull fracture. No significant scalp soft tissue swelling. SINUSES/ORBITS: The mastoid air-cells and included paranasal sinuses are well-aerated.The included ocular globes and orbital contents are non-suspicious. Status post bilateral ocular lens implants. OTHER: None. ASPECTS [facility] Stroke Program Early CT Score) - Ganglionic level infarction (caudate, lentiform nuclei, internal capsule, insula, M1-M3 cortex): 7 - Supraganglionic infarction (M4-M6 cortex): 3 Total score (0-10 with 10 being normal): 10 IMPRESSION: 1. No acute intracranial process. Negative noncontrast CT HEAD for age. 2. ASPECTS is 10. 3. Critical Value/emergent results were called by telephone at the time of interpretation on 11/17/2016 at 1:15 am to Dr. Marjean Donna , who verbally acknowledged these results. Electronically Signed   By: Elon Alas M.D.   On: 11/17/2016 01:19    Assessment: 76 y.o. female presenting with worsening right leg weakness and left face numbness/droop.  Head CT reviewed and shows no acute changes.  Symptoms today are minimal.  Patient unable to have MRI due to pacer.  Suspect small acute infarct due  to small vessel disease.  Patient previously on ASA.  Carotid dopplers show no evidence of hemodynamically significant stenosis.  Echocardiogram shows no cardiac source of emboli with an EF of 55-60%.  LDL 85.  Stroke Risk Factors - hyperlipidemia and hypertension  Plan: 1. PT consult, OT consult, Speech consult 2. Prophylactic therapy-Antiplatelet med: Plavix - dose 75mg  daily 3. Telemetry monitoring while hospitalized.  Due to dilated atrium would consider holter/LINQ monitoring as an outpatienttt 4. Aggressive lipid management with target LDL<70.    Alexis Goodell, MD Neurology [phone] 11/17/2016, 2:12  PM

## 2016-11-17 NOTE — ED Provider Notes (Signed)
[facility] Emergency Department Provider Note   First MD Initiated Contact with Patient 11/17/16 0050     (approximate)  I have reviewed the triage vital signs and the nursing notes.   HISTORY  Chief Complaint Fall and Dizziness    HPI Kristi [name] is a 76 y.o. female with globus of chronic medical conditions presents to the emergency department with history of acute dizziness Estill Bamberg began at 11:00 PM accompanied by right leg numbness and weakness. Patient denies any headache. Patient denies any nausea or vomiting.   Past Medical History:  Diagnosis Date  . Anxiety   . Broken arm    left FA 3/17  . Broken arm    left  . Depression   . Hyperlipidemia   . Hypertension   . Hypothyroid   . Myocardial infarction ([place])   . Neck pain 06/04/2014  . Spinal stenosis     Patient Active Problem List   Diagnosis Date Noted  . TIA (transient ischemic attack) 11/17/2016  . Parkinson disease ([place]) 10/27/2016  . Tremor 10/27/2016  . Chronic knee pain (B) (R>L) 10/19/2016  . Chest pain 09/29/2016  . Abnormal CT scan, lumbar spine 09/08/2016  . Lumbar foraminal stenosis (multilevel) 09/08/2016  . Fracture of clavicle 08/03/2016  . Polyneuropathy 05/17/2016  . Neurogenic pain 04/12/2016  . Chronic lower extremity pain (Location of Primary Source of Pain) (Bilateral) (R>L) 04/12/2016  . Chronic lower extremity radicular pain (Location of Primary Source of Pain) (Bilateral) (R>L) 04/12/2016  . Lower extremity weakness 04/12/2016  . Chronic low back pain (Location of Secondary source of pain) (Bilateral) (R>L) 04/12/2016  . Chronic wrist pain (Left) (secondary to fracture) 04/12/2016  . Lumbar spondylosis 04/12/2016  . Chronic pain syndrome 04/11/2016  . Long term current use of opiate analgesic 04/11/2016  . Long term prescription opiate use 04/11/2016  . Opiate use 04/11/2016  . Long term prescription benzodiazepine use 04/11/2016  . Lumbar  radiculopathy (Multilevel) (Bilateral) 05/07/2015  . Lumbar facet syndrome (Bilateral) (R>L) 08/21/2014  . 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 Sacroiliac joint dysfunction (Bilateral) 08/13/2014  . Frequent falls 06/04/2014  . Memory loss 06/04/2014  . Anxiety and depression 08/22/2013  . CAD (coronary artery disease) 08/22/2013  . Therapeutic opioid-induced constipation (OIC) 08/22/2013  . GERD (gastroesophageal reflux disease) 08/22/2013    Past Surgical History:  Procedure Laterality Date  . ABDOMINAL HYSTERECTOMY    . CORONARY ARTERY BYPASS GRAFT    . CORONARY STENT PLACEMENT    . s/p pacer insertion      Prior to Admission medications   Medication Sig Start Date End Date Taking? Authorizing Provider  cholecalciferol (VITAMIN D) 1000 UNITS tablet Take 1,000 Units by mouth daily. Reported on 07/08/2015   Yes [provider]  clonazePAM (KLONOPIN) 0.5 MG tablet Take 0.5 mg by mouth 2 (two) times daily.   Yes [provider]  docusate sodium (COLACE) 100 MG capsule Take 100 mg by mouth 2 (two) times daily.   Yes [provider]  furosemide (LASIX) 40 MG tablet Take 40 mg by mouth 2 (two) times daily.   Yes [provider]  gabapentin (NEURONTIN) 300 MG capsule Take 1 capsule (300 mg total) by mouth 3 (three) times daily. Patient taking differently: Take 300 mg by mouth at bedtime.  11/09/16 12/09/16 Yes Vevelyn Francois, NP  lamoTRIgine (LAMICTAL) 100 MG tablet take 100 mg by mouth three times daily   Yes [provider]  levothyroxine (SYNTHROID, LEVOTHROID) 100 MCG tablet Take 100 mcg by mouth daily before breakfast.   Yes [provider]  Linaclotide (LINZESS) 145 MCG CAPS capsule Take 145 mcg by mouth daily.   Yes [provider]  loratadine (CLARITIN) 10 MG tablet Take 10 mg by mouth daily.   Yes [provider]  Multiple Vitamins-Minerals (ALIVE WOMENS ENERGY PO) Take 1 tablet  by mouth daily.   Yes [provider]  omeprazole (PRILOSEC) 20 MG capsule Take 20 mg by mouth daily.   Yes [provider]  potassium chloride SA (K-DUR,KLOR-CON) 20 MEQ tablet Take 20 mEq by mouth 2 (two) times daily.  08/18/16  Yes [provider]  rosuvastatin (CRESTOR) 20 MG tablet Take 20 mg by mouth daily.   Yes [provider]  sertraline (ZOLOFT) 100 MG tablet Take 50-100 mg by mouth 2 (two) times daily. Take 100 mg in the am and 50 mg at bedtime.   Yes [provider]  thiothixene (NAVANE) 5 MG capsule Take 5 mg by mouth 2 (two) times daily. 08/09/16  Yes [provider]  traZODone (DESYREL) 50 MG tablet Take 50 mg by mouth at bedtime as needed for sleep.    Yes [provider]  vitamin B-12 (CYANOCOBALAMIN) 500 MCG tablet Take 500 mcg by mouth daily.    Yes [provider]  aspirin EC 81 MG tablet Take 81 mg by mouth daily.    [provider]  carvedilol (COREG) 6.25 MG tablet 2 (two) times daily.  10/10/16   [provider]  nitroGLYCERIN (NITROSTAT) 0.4 MG SL tablet Place 0.4 mg under the tongue every 5 (five) minutes as needed for chest pain.  10/03/16   [provider]    Allergies Penicillin g  Family History  Problem Relation Age of Onset  . Cancer Mother   . Depression Mother   . Diabetes Mother   . Hypertension Mother   . Heart disease Father     Social History Social History  Substance Use Topics  . Smoking status: Never Smoker  . Smokeless tobacco: Never Used  . Alcohol use No    Review of Systems Constitutional: No fever/chills Eyes: No visual changes. ENT: No sore throat. Cardiovascular: Denies chest pain. Respiratory: Denies shortness of breath. Gastrointestinal: No abdominal pain.  No nausea, no vomiting.  No diarrhea.  No constipation. Genitourinary: Negative for dysuria. Musculoskeletal: Negative for neck pain.  Negative for back pain. Integumentary:  Negative for rash. Neurological: Negative for headachesPositive for dizziness and right leg numbness weakness  ____________________________________________   PHYSICAL EXAM:  VITAL SIGNS: ED Triage Vitals  Enc Vitals Group     BP 11/17/16 0053 (!) 144/84     Pulse Rate 11/17/16 0050 (!) 107     Resp 11/17/16 0051 (!) 26     Temp 11/17/16 0054 (!) 97.5 F (36.4 C)     Temp src --      SpO2 11/17/16 0050 97 %     Weight 11/17/16 0054 65.8 kg (145 lb)     Height 11/17/16 0054 1.676 m (5\' 6" )     Head Circumference --      Peak Flow --      Pain Score 11/17/16 0052 6     Pain Loc --      Pain Edu? --      Excl. in [place]? --     Constitutional: Alert and oriented. Well appearing and in no acute distress.  Eyes: Conjunctivae are normal. PERRL. EOMI. Head: Atraumatic. Mouth/Throat: Mucous membranes are moist.  Oropharynx non-erythematous. Neck: No stridor.   Cardiovascular: Normal rate, regular rhythm. Good peripheral circulation. Grossly normal heart sounds. Respiratory: Normal respiratory effort.  No retractions. Lungs CTAB. Gastrointestinal: Soft and nontender. No distention.  Musculoskeletal: No lower extremity tenderness nor edema. No gross deformities of extremities. Neurologic:  Normal speech and language.Left facial droop, right leg weakness (did not hit the bed but unable to hold her leg up.  Skin:  Skin is warm, dry and intact. No rash noted. Psychiatric: Mood and affect are normal. Speech and behavior are normal.  ____________________________________________   LABS (all labs ordered are listed, but only abnormal results are displayed)  Labs Reviewed  CBC - Abnormal; Notable for the following:       Result Value   RBC 3.61 (*)    Hemoglobin 11.4 (*)    HCT 33.5 (*)    RDW 15.6 (*)    Platelets 106 (*)    All other components within normal limits  DIFFERENTIAL - Abnormal; Notable for the following:    Neutro Abs 7.1 (*)    All other components within normal  limits  COMPREHENSIVE METABOLIC PANEL - Abnormal; Notable for the following:    Glucose, Bld 164 (*)    BUN 24 (*)    Creatinine, Ser 1.04 (*)    GFR calc non Af Amer 50 (*)    GFR calc Af Amer 58 (*)    All other components within normal limits  URINALYSIS, COMPLETE (UACMP) WITH MICROSCOPIC - Abnormal; Notable for the following:    Color, Urine YELLOW (*)    APPearance TURBID (*)    Hgb urine dipstick MODERATE (*)    Leukocytes, UA LARGE (*)    Bacteria, UA MANY (*)    Squamous Epithelial / LPF 0-5 (*)    Non Squamous Epithelial 0-5 (*)    All other components within normal limits  ETHANOL  PROTIME-INR  TROPONIN I   ____________________________________________  EKG  ED ECG REPORT I, [place] Tryton Bodi, the attending physician, personally viewed and interpreted this ECG.   Date: 11/17/2016  EKG Time: 12:51 AM  Rate: 81  Rhythm: Ventricular paced rhythm  Axis: Normal  Intervals: Normal  ST&T Change: None  ____________________________________________  RADIOLOGY I, Gard[place] N Kienan Doublin, personally viewed and evaluated these images (plain radiographs) as part of my medical decision making, as well as reviewing the written report by the radiologist.  Dg Shoulder Right  Result Date: 11/17/2016 CLINICAL DATA:  Fall, hematoma to back of head EXAM: RIGHT SHOULDER - 2+ VIEW COMPARISON:  None. FINDINGS: Partially visualized sternotomy changes. No acute displaced fracture or dislocation. Mild AC joint and glenohumeral degenerative changes. IMPRESSION: No acute osseous abnormality. Electronically Signed   By: Donavan Foil M.D.   On: 11/17/2016 03:09   Ct Head Code Stroke Wo Contrast  Result Date: 11/17/2016 CLINICAL DATA:  Code stroke. Dizziness, fall. RIGHT-sided weakness. LEFT facial droop. History of hypertension and hyperlipidemia. EXAM: CT HEAD WITHOUT CONTRAST TECHNIQUE: Contiguous axial images were obtained from the base of the skull through the vertex without intravenous  contrast. COMPARISON:  CT HEAD May 20, 2014 FINDINGS: BRAIN: No intraparenchymal hemorrhage, mass effect nor midline shift. The ventricles and sulci are normal for age. Patchy supratentorial white matter hypodensities within normal range for patient's age, though non-specific are most compatible with chronic small vessel ischemic disease. No acute large vascular territory infarcts. No abnormal extra-axial fluid collections. Basal  cisterns are patent. VASCULAR: Moderate calcific atherosclerosis of the carotid siphons. SKULL: No skull fracture. No significant scalp soft tissue swelling. SINUSES/ORBITS: The mastoid air-cells and included paranasal sinuses are well-aerated.The included ocular globes and orbital contents are non-suspicious. Status post bilateral ocular lens implants. OTHER: None. ASPECTS [facility] Stroke Program Early CT Score) - Ganglionic level infarction (caudate, lentiform nuclei, internal capsule, insula, M1-M3 cortex): 7 - Supraganglionic infarction (M4-M6 cortex): 3 Total score (0-10 with 10 being normal): 10 IMPRESSION: 1. No acute intracranial process. Negative noncontrast CT HEAD for age. 2. ASPECTS is 10. 3. Critical Value/emergent results were called by telephone at the time of interpretation on 11/17/2016 at 1:15 am to Dr. Marjean Donna , who verbally acknowledged these results. Electronically Signed   By: Elon Alas M.D.   On: 11/17/2016 01:19    ____________________________________________   PROCEDURES  Critical Care performed:CRITICAL CARE Performed by: Gregor Hams   Total critical care time:30 minutes  Critical care time was exclusive of separately billable procedures and treating other patients.  Critical care was necessary to treat or prevent imminent or life-threatening deterioration.  Critical care was time spent personally by me on the following activities: development of treatment plan with patient and/or surrogate as well as nursing, discussions with  consultants, evaluation of patient's response to treatment, examination of patient, obtaining history from patient or surrogate, ordering and performing treatments and interventions, ordering and review of laboratory studies, ordering and review of radiographic studies, pulse oximetry and re-evaluation of patient's condition.   Procedures   ____________________________________________   INITIAL IMPRESSION / ASSESSMENT AND PLAN / ED COURSE  Pertinent labs & imaging results that were available during my care of the patient were reviewed by me and considered in my medical decision making (see chart for details).  76 year old female presenting with above stated history with concern for possible CVA as such, stroke protocol initiated. CT scan revealed no evidence of CVA. Patient then discussed with specialist on-call neurologist who stated no TPA at this time. Patient then discussed with Dr.Pyreddy for hospital admission for further evaluation and management. Of note patient noted to have a urinary tract infection as such was given ceftriaxone 1 g.      ____________________________________________  FINAL CLINICAL IMPRESSION(S) / ED DIAGNOSES  Final diagnoses:  Cerebrovascular accident (CVA), unspecified mechanism ([place])  Acute cystitis without hematuria     MEDICATIONS GIVEN DURING THIS VISIT:  Medications - No data to display   NEW OUTPATIENT MEDICATIONS STARTED DURING THIS VISIT:  New Prescriptions   No medications on file    Modified Medications   No medications on file    Discontinued Medications   FUROSEMIDE (LASIX) 40 MG TABLET    40 mg oral bid     Note:  This document was prepared using Dragon voice recognition software and may include unintentional dictation errors.    Gregor Hams, MD 11/17/16 [id number]

## 2016-11-17 NOTE — Care Management Obs Status (Signed)
[place] NOTIFICATION   Patient Details  Name: Kristi [name] MRN: [id number] Date of Birth: Sep 25, 1940   Medicare Observation Status Notification Given:  Yes    Marshell Garfinkel, RN 11/17/2016, 8:35 AM
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
## 2016-11-17 NOTE — ED Notes (Addendum)
Awaiting teleneuro consult to begin at pt bedside. This RN with pt at this time
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
## 2016-11-17 NOTE — Evaluation (Signed)
Physical Therapy Evaluation Patient Details Name: Kristi [name] MRN: [id number] DOB: 06/08/1940 Today's Date: 11/17/2016   History of Present Illness  Pt is a 76 y.o.femalewith a known history of hyperlipidemia, hypertension, hypothyroidism, myocardial infarction spinal stenosis presented to the emergency room for left facial weakness and right leg weakness. She noticed this around 10 PM last night. Patient had injections to her back for chronic back pain. She felt her right leg was more weak and also had some facial numbness on the left side. She presented to the emergency room and patient was evaluated with a CT head which showed no acute abnormality.Code stroke was called. Tele neuro consultation was done who recommended aspirin and further workup with echocardiogram, carotid ultrasound. Did not recommend any TPA therapy. Hospitalist service was consulted. In the emergency room the left of facial numbness resolved.No prior history of CVA. Patient also felt dizzy and weak.  Assessment includes: TIA, HTN, HLD, and spinal stenosis.     Clinical Impression  Pt presents with deficits in strength, transfers, mobility, gait, balance, and activity tolerance.  Pt required min A and extra time and effort with bed mobility tasks.  Pt required close CGA with sit to/from stand with some instability upon initial stand that pt was able to self correct.  With unsupported static standing pt presented with frequent posterior LOB requiring min A to correct.  Pt only able to amb 5' with RW and step-to gait pattern with decreased stance time on weaker RLE.  Pt is currently at high risk for falls.  Pt will benefit from PT services in a SNF setting upon discharge to safely address above deficits for decreased caregiver assistance, decreased fall risk, and eventual return to prior living situation.     Follow Up Recommendations SNF    Equipment Recommendations  None recommended by PT    Recommendations for Other  Services OT consult     Precautions / Restrictions Precautions Precautions: Fall Restrictions Weight Bearing Restrictions: No      Mobility  Bed Mobility Overal bed mobility: Needs Assistance Bed Mobility: Sit to Supine;Supine to Sit     Supine to sit: Min assist Sit to supine: Min assist   General bed mobility comments: Effortful with all bed mobility tasks with extra time and effort required  Transfers Overall transfer level: Needs assistance Equipment used: Rolling walker (2 wheeled) Transfers: Sit to/from Stand Sit to Stand: Min guard         General transfer comment: Mod verbal cues for proper hand placement and general sequencing  Ambulation/Gait Ambulation/Gait assistance: Min guard Ambulation Distance (Feet): 5 Feet Assistive device: Rolling walker (2 wheeled) Gait Pattern/deviations: Step-to pattern;Decreased stance time - right   Gait velocity interpretation: <1.8 ft/sec, indicative of risk for recurrent falls General Gait Details: Very slow cadence with amb with step-to pattern and decreased RLE stance time with heavy use of BUEs on RW and mod verbal cues for sequencing for upright posture and amb closer to [employer] Stairs:  (deferred)          Wheelchair Mobility    Modified Rankin (Stroke Patients Only)       Balance Overall balance assessment: Needs assistance Sitting-balance support: No upper extremity supported;Feet supported Sitting balance-Leahy Scale: Fair     Standing balance support: No upper extremity supported Standing balance-Leahy Scale: Poor Standing balance comment: Frequent posterior LOB during static standing balance assessment without UE support  Pertinent Vitals/Pain Pain Assessment: No/denies pain    Home Living Family/patient expects to be discharged to:: Private residence Living Arrangements: Alone Available Help at Discharge: Family;Available PRN/intermittently Type of  Home: Mobile home Home Access: Stairs to enter Entrance Stairs-Rails: Right;Left;Can reach both Entrance Stairs-Number of Steps: 4 Home Layout: One level Home Equipment: Walker - 2 wheels;Cane - single point      Prior Function Level of Independence: Independent         Comments: Ind Amb without AD, Ind with ADLs, pt reports several falls in last 6 months     Hand Dominance   Dominant Hand: Right    Extremity/Trunk Assessment   Upper Extremity Assessment Upper Extremity Assessment: Defer to OT evaluation    Lower Extremity Assessment Lower Extremity Assessment: Generalized weakness;RLE deficits/detail RLE Deficits / Details: RLE: knee ext 3/5, knee flex 4-/5, hip flex 3/5 RLE Sensation:  (Sensation to light touch and proprioception grossly intact to BLEs)       Communication   Communication: No difficulties  Cognition Arousal/Alertness: Awake/alert Behavior During Therapy: WFL for tasks assessed/performed Overall Cognitive Status: Within Functional Limits for tasks assessed                                        General Comments      Exercises Total Joint Exercises Ankle Circles/Pumps: AROM;Both;10 reps Quad Sets: Strengthening;Both;10 reps Gluteal Sets: Strengthening;Both;10 reps Long Arc Quad: AROM;Both;10 reps;15 reps Knee Flexion: AROM;Both;10 reps;15 reps Marching in Standing: AROM;Both;10 reps Other Exercises Other Exercises: Seated B hip flex x 10 each Other Exercises: HEP education/review for B APs, QS, and GS x 10 each 5-6x/day   Assessment/Plan    PT Assessment Patient needs continued PT services  PT Problem List Decreased strength;Decreased activity tolerance;Decreased balance;Decreased knowledge of use of DME;Decreased mobility       PT Treatment Interventions DME instruction;Gait training;Stair training;Functional mobility training;Neuromuscular re-education;Balance training;Therapeutic exercise;Therapeutic  activities;Patient/family education    PT Goals (Current goals can be found in the Care Plan section)  Acute Rehab PT Goals Patient Stated Goal: To take care of myself better PT Goal Formulation: With patient Time For Goal Achievement: 11/30/16 Potential to Achieve Goals: Fair    Frequency 7X/week   Barriers to discharge        Co-evaluation               AM-PAC PT "6 Clicks" Daily Activity  Outcome Measure Difficulty turning over in bed (including adjusting bedclothes, sheets and blankets)?: Unable Difficulty moving from lying on back to sitting on the side of the bed? : Unable Difficulty sitting down on and standing up from a chair with arms (e.g., wheelchair, bedside commode, etc,.)?: Unable Help needed moving to and from a bed to chair (including a wheelchair)?: A Little Help needed walking in hospital room?: A Lot Help needed climbing 3-5 steps with a railing? : Total 6 Click Score: 9    End of Session Equipment Utilized During Treatment: Gait belt Activity Tolerance: Patient limited by fatigue Patient left: in bed;with bed alarm set;with call bell/phone within reach Nurse Communication: Mobility status PT Visit Diagnosis: Difficulty in walking, not elsewhere classified (R26.2);Muscle weakness (generalized) (M62.81)    Time: 1610-9604 PT Time Calculation (min) (ACUTE ONLY): 31 min   Charges:   PT Evaluation $PT Eval Low Complexity: 1 Low PT Treatments $Therapeutic Exercise: 8-22 mins   PT G Codes:  PT G-Codes **NOT FOR INPATIENT CLASS** Functional Assessment Tool Used: AM-PAC 6 Clicks Basic Mobility Functional Limitation: Mobility: Walking and moving around Mobility: Walking and Moving Around Current Status (S9702): At least 60 percent but less than 80 percent impaired, limited or restricted Mobility: Walking and Moving Around Goal Status [phone]): At least 20 percent but less than 40 percent impaired, limited or restricted    D. Scott Jeyson Deshotel PT,  DPT 11/17/16, 10:22 AM

## 2016-11-17 NOTE — ED Triage Notes (Addendum)
Pt to ED by [facility] d/t dizziness leading to fall. Hematoma to back of head, right sided weakness/numbness in leg. Left sided facial droop

## 2016-11-17 NOTE — Discharge Instructions (Signed)
[place] Nurse Tech, Physical Therapy and Social Worker  [facility] - pick up shower chair

## 2016-11-17 NOTE — ED Notes (Signed)
Report to Kennyth Lose, RN 1C

## 2016-11-17 NOTE — ED Notes (Signed)
Admitting MD with pt at this time.  

## 2016-11-17 NOTE — Progress Notes (Signed)
OT Cancellation Note  Patient Details Name: Kristi [name] MRN: [id number] DOB: 03/04/1941   Cancelled Treatment:    Reason Eval/Treat Not Completed: Patient at procedure or test/ unavailable. Order received, chart reviewed. Pt unavailable for OT evaluation upon attempt, with staff for Echo testing in pt's room. Will re-attempt at later time as pt is available.  Jeni Salles, MPH, MS, OTR/L ascom [phone] 11/17/16, 9:54 AM

## 2016-11-17 NOTE — Progress Notes (Signed)
[place] [facility] [place] NAME: Kristi [name]    MR#:  [id number]  DATE OF BIRTH:  [birth year]  SUBJECTIVE:   Pt. Here due to recurrent falls and left sided facial droop.  Suspicious for TIA/CVA.  CT head (-) and cannot have MRI as pt. Has pacemaker.   No acute complaints presently.   REVIEW OF SYSTEMS:    Review of Systems  Constitutional: Negative for chills and fever.  HENT: Negative for congestion and tinnitus.   Eyes: Negative for blurred vision and double vision.  Respiratory: Negative for cough, shortness of breath and wheezing.   Cardiovascular: Negative for chest pain, orthopnea and PND.  Gastrointestinal: Negative for abdominal pain, diarrhea, nausea and vomiting.  Genitourinary: Negative for dysuria and hematuria.  Musculoskeletal: Positive for falls.  Neurological: Negative for dizziness, sensory change and focal weakness.  All other systems reviewed and are negative.   Nutrition: heart healthy Tolerating Diet: Yes Tolerating PT: Eval noted.   DRUG ALLERGIES:   Allergies  Allergen Reactions  . Penicillin G Swelling and Rash    Has patient had a PCN reaction causing immediate rash, facial/tongue/throat swelling, SOB or lightheadedness with hypotension: Yes Has patient had a PCN reaction causing severe rash involving mucus membranes or skin necrosis: No Has patient had a PCN reaction that required hospitalization: No Has patient had a PCN reaction occurring within the last 10 years: No If all of the above answers are "NO", then may proceed with Cephalosporin use.     VITALS:  Blood pressure 112/61, pulse 69, temperature 97.7 F (36.5 C), temperature source Oral, resp. rate 16, height 5\' 6"  (1.676 m), weight 65.8 kg (145 lb), SpO2 98 %.  PHYSICAL EXAMINATION:   Physical Exam  GENERAL:  76 y.o.-year-old patient lying in bed in no acute distress.  EYES: Pupils equal, round, reactive to light and accommodation. No scleral  icterus. Extraocular muscles intact.  HEENT: Head atraumatic, normocephalic. Oropharynx and nasopharynx clear.  NECK:  Supple, no jugular venous distention. No thyroid enlargement, no tenderness.  LUNGS: Normal breath sounds bilaterally, no wheezing, rales, rhonchi. No use of accessory muscles of respiration.  CARDIOVASCULAR: S1, S2 normal. No murmurs, rubs, or gallops.  ABDOMEN: Soft, nontender, nondistended. Bowel sounds present. No organomegaly or mass.  EXTREMITIES: No cyanosis, clubbing or edema b/l.    NEUROLOGIC: Cranial nerves II through XII are intact. No focal Motor or sensory deficits b/l. Globally weak but has mild left sided facial droop.  Reflexes + 2 b/l.    PSYCHIATRIC: The patient is alert and oriented x 3.  SKIN: No obvious rash, lesion, or ulcer.    LABORATORY PANEL:   CBC  Recent Labs Lab 11/17/16 0053  WBC 8.4  HGB 11.4*  HCT 33.5*  PLT 106*   ------------------------------------------------------------------------------------------------------------------  Chemistries   Recent Labs Lab 11/17/16 0053  NA 138  K 4.0  CL 101  CO2 29  GLUCOSE 164*  BUN 24*  CREATININE 1.04*  CALCIUM 9.3  AST 26  ALT 19  ALKPHOS 60  BILITOT 0.7   ------------------------------------------------------------------------------------------------------------------  Cardiac Enzymes  Recent Labs Lab 11/17/16 0053  TROPONINI <0.03   ------------------------------------------------------------------------------------------------------------------  RADIOLOGY:  Dg Shoulder Right  Result Date: 11/17/2016 CLINICAL DATA:  Fall, hematoma to back of head EXAM: RIGHT SHOULDER - 2+ VIEW COMPARISON:  None. FINDINGS: Partially visualized sternotomy changes. No acute displaced fracture or dislocation. Mild AC joint and glenohumeral degenerative changes. IMPRESSION: No acute osseous abnormality. Electronically Signed  By: Donavan Foil M.D.   On: 11/17/2016 03:09   US Carotid  Bilateral (at [facility] Only)  Result Date: 11/17/2016 CLINICAL DATA:  TIA. EXAM: BILATERAL CAROTID DUPLEX ULTRASOUND TECHNIQUE: Pearline Cables scale imaging, color Doppler and duplex ultrasound were performed of bilateral carotid and vertebral arteries in the neck. Exam was limited due to patient movement during the exam. COMPARISON:  CT 11/17/2016. FINDINGS: Criteria: Quantification of carotid stenosis is based on velocity parameters that correlate the residual internal carotid diameter with NASCET-based stenosis levels, using the diameter of the distal internal carotid lumen as the denominator for stenosis measurement. The following velocity measurements were obtained: RIGHT ICA:  53/13 cm/sec CCA:  58/09 cm/sec SYSTOLIC ICA/CCA RATIO:  0.9 DIASTOLIC ICA/CCA RATIO:  0.8 ECA:  61 cm/sec LEFT ICA:  74/26 cm/sec CCA:  98/33 cm/sec SYSTOLIC ICA/CCA RATIO:  1.0 DIASTOLIC ICA/CCA RATIO:  1.6 ECA:  43 cm/sec RIGHT CAROTID ARTERY: Mild right carotid bifurcation atherosclerotic vascular disease. No flow limiting stenosis. RIGHT VERTEBRAL ARTERY:  Patent with antegrade flow. LEFT CAROTID ARTERY: Mild left carotid bifurcation proximal ICA atherosclerotic vascular disease. No flow limiting stenosis. LEFT VERTEBRAL ARTERY:  Patent with antegrade flow. IMPRESSION: 1. 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 No flow limiting stenosis. Degree of stenosis less than 50% bilaterally. 2. Vertebrals are patent antegrade flow. Electronically Signed   By: Marcello Moores  Register   On: 11/17/2016 12:02   Ct Head Code Stroke Wo Contrast  Result Date: 11/17/2016 CLINICAL DATA:  Code stroke. Dizziness, fall. RIGHT-sided weakness. LEFT facial droop. History of hypertension and hyperlipidemia. EXAM: CT HEAD WITHOUT CONTRAST TECHNIQUE: Contiguous axial images were obtained from the base of the skull through the vertex without intravenous contrast. COMPARISON:  CT HEAD May 20, 2014 FINDINGS: BRAIN: No  intraparenchymal hemorrhage, mass effect nor midline shift. The ventricles and sulci are normal for age. Patchy supratentorial white matter hypodensities within normal range for patient's age, though non-specific are most compatible with chronic small vessel ischemic disease. No acute large vascular territory infarcts. No abnormal extra-axial fluid collections. Basal cisterns are patent. VASCULAR: Moderate calcific atherosclerosis of the carotid siphons. SKULL: No skull fracture. No significant scalp soft tissue swelling. SINUSES/ORBITS: The mastoid air-cells and included paranasal sinuses are well-aerated.The included ocular globes and orbital contents are non-suspicious. Status post bilateral ocular lens implants. OTHER: None. ASPECTS [facility] Stroke Program Early CT Score) - Ganglionic level infarction (caudate, lentiform nuclei, internal capsule, insula, M1-M3 cortex): 7 - Supraganglionic infarction (M4-M6 cortex): 3 Total score (0-10 with 10 being normal): 10 IMPRESSION: 1. No acute intracranial process. Negative noncontrast CT HEAD for age. 2. ASPECTS is 10. 3. Critical Value/emergent results were called by telephone at the time of interpretation on 11/17/2016 at 1:15 am to Dr. Marjean Donna , who verbally acknowledged these results. Electronically Signed   By: Elon Alas M.D.   On: 11/17/2016 01:19     ASSESSMENT AND PLAN:   76 year old female with past medical history of hypertension, hyperlipidemia, hypothyroidism, history of spinal stenosis, anxiety who presented to the hospital due to recurrent falls and mild left-sided facial droop.  1. TIA/CVA-this is the working diagnosis on admission given patient's mild left-sided facial droop. CT head is negative for acute pathology, patient cannot have an MRI given her history of pacemaker. -Carotid duplex negative for any hemodynamically significant carotid stenosis. Await echocardiogram results. -Await further neurology input.   -Continue  aspirin, Crestor.  2. Urinary tract infection-this is based off the urinalysis on admission.  Even 1 dose of IV ceftriaxone earlier today. -Follow urine cultures. Will start on Oral Ceftin  3. hypothyroidism-continue Synthroid.  4. Depression-continue Zoloft, Lamictal.  5. Anxiety-continue Klonopin.   physical therapy evaluation noted and patient needs SNF/STR but is under observation. Await further input from Social Work/Case management for discharge.        All the records are reviewed and case discussed with Care Management/Social Worker. Management plans discussed with the patient, family and they are in agreement.  CODE STATUS: Full code  DVT Prophylaxis: Lovenox.  TOTAL TIME TAKING CARE OF THIS PATIENT: 30 minutes.   POSSIBLE D/C IN 1-2 DAYS, DEPENDING ON CLINICAL CONDITION.   Henreitta Leber M.D on 11/17/2016 at 2:50 PM  Between 7am to 6pm - Pager - [phone]  After 6pm go to [email or web address] - Proofreader  [facility] [place] [facility]  Office  [phone]  CC: Primary care physician; Lorelee Market, MD

## 2016-11-17 NOTE — Care Management (Signed)
RNCM met with patient to discuss discharge planning. She states she is from home alone and has fallen "3 times".  I asked if she has a walker and she does but doesn't use it.  Her PCP is Dr. Lavera Guise. She is interested in someone helping dress her and light house keeping. She is refusing home health PT.  List of home health agencies and private duty/pay agencies left with patient. RNCM will continue to follow.

## 2016-11-17 NOTE — Care Management (Addendum)
RNCM received call from MD stating concern for patient not accepting home health PT. RNCM spoke with patient's daughter that is en-route to [facility] to visit with patient. She states that her mother has a walker and "scared to use it". I encouraged her to encourage patient to use it.  I also encouraged her to have a home health agency in place.  RNCM will follow up with patient. 1532: RNCM received callback from patient and her daughter regarding home health arrangements. Patient now agrees without preference of agencies. She is aware that [facility] also has a private duty agency and would like to try their home health services as she was not happy with the last agency.  Message left for [place] with [facility]. I have talked to main office with [facility] [phone]- spoke with Bethena Roys with [facility] and she has accepted this patient. OT is recommending tub seat which has been requested from MD. Daughter and patient agree with tub seat; grab bars. Tub bench [facility] however grab bars are not covered under Medicaid. Daughter aware and agrees.

## 2016-11-17 NOTE — Progress Notes (Signed)
OT Cancellation Note  Patient Details Name: Kristi [name] MRN: [id number] DOB: Feb 02, 1941   Cancelled Treatment:    Reason Eval/Treat Not Completed: Patient at procedure or test/ unavailable. On 2nd attempt, pt preparing with staff to transport to ultrasound. Will re-attempt this afternoon as pt is available.  Jeni Salles, MPH, MS, OTR/L ascom [phone] 11/17/16, 10:59 AM

## 2016-11-17 NOTE — ED Notes (Signed)
ED Provider at bedside. 

## 2016-11-17 NOTE — Progress Notes (Signed)
MD order received to discharge pt home today with home health NT, PT and SW; Care Management previously established home health services with [facility]; pt to pick up shower chair from [facility] located across the street from [facility]; verbally reviewed AVS with pt and pt's daughter, gave Rxs to pt; no questions voiced at this time; pt discharged via wheelchair by nursing to the visitor's entrance

## 2016-11-22 ENCOUNTER — Telehealth: Payer: Self-pay | Admitting: Nurse Practitioner

## 2016-11-22 ENCOUNTER — Telehealth: Payer: Self-pay

## 2016-11-22 NOTE — Telephone Encounter (Signed)
Patient has some questions about 2 medications and what they are for, please call

## 2016-11-22 NOTE — Telephone Encounter (Signed)
Called pt and she questioning gabapenting. Read order to her and explained that she needs to take medications as ordered

## 2016-11-24 ENCOUNTER — Telehealth: Payer: Self-pay

## 2016-11-24 NOTE — Telephone Encounter (Signed)
Called pt, no answer.

## 2016-11-24 NOTE — Telephone Encounter (Signed)
Contacted patient, she initially was not clear how to take the Gabapentin, but now understands. Patient explained how she is taking it, verified that is correct.

## 2016-11-24 NOTE — Telephone Encounter (Signed)
Called pt back, No answer

## 2016-12-04 ENCOUNTER — Emergency Department
Admission: EM | Admit: 2016-12-04 | Discharge: 2016-12-04 | Disposition: A | Discharge status: Home / Self Care | Payer: Medicare Other | Attending: Emergency Medicine | Admitting: Emergency Medicine

## 2016-12-04 ENCOUNTER — Emergency Department: Payer: Medicare Other

## 2016-12-04 DIAGNOSIS — Z7902 Long term (current) use of antithrombotics/antiplatelets: Secondary | ICD-10-CM | POA: Diagnosis not present

## 2016-12-04 DIAGNOSIS — I10 Essential (primary) hypertension: Secondary | ICD-10-CM | POA: Insufficient documentation

## 2016-12-04 DIAGNOSIS — Y9301 Activity, walking, marching and hiking: Secondary | ICD-10-CM | POA: Insufficient documentation

## 2016-12-04 DIAGNOSIS — S30810A Abrasion of lower back and pelvis, initial encounter: Secondary | ICD-10-CM | POA: Diagnosis not present

## 2016-12-04 DIAGNOSIS — Z951 Presence of aortocoronary bypass graft: Secondary | ICD-10-CM | POA: Diagnosis not present

## 2016-12-04 DIAGNOSIS — Z23 Encounter for immunization: Secondary | ICD-10-CM | POA: Diagnosis not present

## 2016-12-04 DIAGNOSIS — W19XXXA Unspecified fall, initial encounter: Secondary | ICD-10-CM | POA: Insufficient documentation

## 2016-12-04 DIAGNOSIS — I2510 Atherosclerotic heart disease of native coronary artery without angina pectoris: Secondary | ICD-10-CM | POA: Insufficient documentation

## 2016-12-04 DIAGNOSIS — Y998 Other external cause status: Secondary | ICD-10-CM | POA: Insufficient documentation

## 2016-12-04 DIAGNOSIS — Z79899 Other long term (current) drug therapy: Secondary | ICD-10-CM | POA: Diagnosis not present

## 2016-12-04 DIAGNOSIS — E039 Hypothyroidism, unspecified: Secondary | ICD-10-CM | POA: Insufficient documentation

## 2016-12-04 DIAGNOSIS — R55 Syncope and collapse: Secondary | ICD-10-CM

## 2016-12-04 DIAGNOSIS — Y929 Unspecified place or not applicable: Secondary | ICD-10-CM | POA: Insufficient documentation

## 2016-12-04 DIAGNOSIS — Z8673 Personal history of transient ischemic attack (TIA), and cerebral infarction without residual deficits: Secondary | ICD-10-CM | POA: Diagnosis not present

## 2016-12-04 DIAGNOSIS — T148XXA Other injury of unspecified body region, initial encounter: Secondary | ICD-10-CM

## 2016-12-04 LAB — DIFFERENTIAL
Basophils Absolute: 0 10*3/uL (ref 0–0.1)
Basophils Relative: 1 %
Eosinophils Absolute: 0.1 10*3/uL (ref 0–0.7)
Eosinophils Relative: 2 %
Lymphocytes Relative: 23 %
Lymphs Abs: 1.6 10*3/uL (ref 1.0–3.6)
Monocytes Absolute: 0.5 10*3/uL (ref 0.2–0.9)
Monocytes Relative: 7 %
Neutro Abs: 4.9 10*3/uL (ref 1.4–6.5)
Neutrophils Relative %: 67 %

## 2016-12-04 LAB — URINE DRUG SCREEN, QUALITATIVE (ARMC ONLY)
Amphetamines, Ur Screen: NOT DETECTED
Barbiturates, Ur Screen: NOT DETECTED
Benzodiazepine, Ur Scrn: NOT DETECTED
Cannabinoid 50 Ng, Ur ~~LOC~~: NOT DETECTED
Cocaine Metabolite,Ur ~~LOC~~: NOT DETECTED
MDMA (Ecstasy)Ur Screen: NOT DETECTED
Methadone Scn, Ur: NOT DETECTED
Opiate, Ur Screen: NOT DETECTED
Phencyclidine (PCP) Ur S: NOT DETECTED
Tricyclic, Ur Screen: NOT DETECTED

## 2016-12-04 LAB — COMPREHENSIVE METABOLIC PANEL
ALT: 40 U/L (ref 14–54)
AST: 30 U/L (ref 15–41)
Albumin: 3.7 g/dL (ref 3.5–5.0)
Alkaline Phosphatase: 90 U/L (ref 38–126)
Anion gap: 9 (ref 5–15)
BUN: 33 mg/dL — ABNORMAL HIGH (ref 6–20)
CO2: 29 mmol/L (ref 22–32)
Calcium: 9 mg/dL (ref 8.9–10.3)
Chloride: 102 mmol/L (ref 101–111)
Creatinine, Ser: 1.43 mg/dL — ABNORMAL HIGH (ref 0.44–1.00)
GFR calc Af Amer: 40 mL/min — ABNORMAL LOW (ref >60)
GFR calc non Af Amer: 34 mL/min — ABNORMAL LOW (ref >60)
Glucose, Bld: 129 mg/dL — ABNORMAL HIGH (ref 65–99)
Potassium: 3.7 mmol/L (ref 3.5–5.1)
Sodium: 140 mmol/L (ref 135–145)
Total Bilirubin: 0.6 mg/dL (ref 0.3–1.2)
Total Protein: 6.5 g/dL (ref 6.5–8.1)

## 2016-12-04 LAB — CBC
HCT: 35.9 % (ref 35.0–47.0)
Hemoglobin: 12.3 g/dL (ref 12.0–16.0)
MCH: 32.4 pg (ref 26.0–34.0)
MCHC: 34.3 g/dL (ref 32.0–36.0)
MCV: 94.2 fL (ref 80.0–100.0)
Platelets: 120 10*3/uL — ABNORMAL LOW (ref 150–440)
RBC: 3.81 MIL/uL (ref 3.80–5.20)
RDW: 17.1 % — ABNORMAL HIGH (ref 11.5–14.5)
WBC: 7.2 10*3/uL (ref 3.6–11.0)

## 2016-12-04 LAB — URINALYSIS, ROUTINE W REFLEX MICROSCOPIC
Bilirubin Urine: NEGATIVE
Glucose, UA: 50 mg/dL — AB
Hgb urine dipstick: NEGATIVE
Ketones, ur: NEGATIVE mg/dL
Leukocytes, UA: NEGATIVE
Nitrite: NEGATIVE
Protein, ur: NEGATIVE mg/dL
Specific Gravity, Urine: 1.013 (ref 1.005–1.030)
pH: 5 (ref 5.0–8.0)

## 2016-12-04 LAB — PROTIME-INR
INR: 0.86
Prothrombin Time: 11.6 seconds (ref 11.4–15.2)

## 2016-12-04 LAB — TROPONIN I
Troponin I: 0.03 ng/mL (ref <0.03)
Troponin I: <0.03 ng/mL (ref <0.03)

## 2016-12-04 LAB — ETHANOL: Alcohol, Ethyl (B): <5 mg/dL (ref <5)

## 2016-12-04 LAB — APTT: aPTT: 25 seconds (ref 24–36)

## 2016-12-04 MED ORDER — SODIUM CHLORIDE 0.9 % IV BOLUS (SEPSIS)
1000.0000 mL | Freq: Once | INTRAVENOUS | Status: AC
  Administered 2016-12-04: 1000 mL via INTRAVENOUS

## 2016-12-04 MED ORDER — TETANUS-DIPHTH-ACELL PERTUSSIS 5-2.5-18.5 LF-MCG/0.5 IM SUSP
0.5000 mL | Freq: Once | INTRAMUSCULAR | Status: AC
  Administered 2016-12-04: 0.5 mL via INTRAMUSCULAR
  Filled 2016-12-04: qty 0.5

## 2016-12-04 NOTE — ED Provider Notes (Signed)
-----------------------------------------   8:06 PM on 12/04/2016 -----------------------------------------   Blood pressure 120/75, pulse (!) 59, temperature 98.2 F (36.8 C), temperature source Oral, resp. rate 18, height 5\' 7"  (1.702 m), weight 65.8 kg (145 lb), SpO2 98 %.  Assuming care from Dr. Clearnce Hasten of Kristi [name] is a 76 y.o. female with a chief complaint of Fall and Near Syncope .    Please refer to H&P by previous MD for further details.  The current plan of care is to f/u pacemaker interrogation.  _________________________ 10:06 PM on 12/04/2016 -----------------------------------------  Pacemaker interrogation with no arrhythmias seen since August. Patient remains well appearing. Kristi [name] has no complaints at this time. Patient be discharged home with instructions left by Dr. Dineen Kid and close follow-up with primary care doctor.        Rudene Re, MD 12/04/16 2207

## 2016-12-04 NOTE — ED Triage Notes (Signed)
Per EMS pt was at home and had a near syncope episode and fell.  When falling pt hit her head and the right side of her back.  Pt reports 7/10 pain in head and rib area on Right side. Pt is A&Ox4.

## 2016-12-04 NOTE — ED Provider Notes (Addendum)
[facility] Emergency Department Provider Note  ____________________________________________   First MD Initiated Contact with Patient 12/04/16 1600     (approximate)  I have reviewed the triage vital signs and the nursing notes.   HISTORY  Chief Complaint Fall and Near Syncope   HPI Kristi [name] is a 76 y.o. female with a history of TIA on aspirin who is presenting to the emergency department today with a near-syncopal episode. Says that she was ambulating when she started to feel lightheaded. She then fell backwards hitting the back of her head and scraping her right lower back but never lost consciousness. At this time she is no longer with any lightheadedness. Denies any chest pain or shortness of breath. EMS reported that initially she had a blood pressure in the low 80s. They did not find any focal neurologic deficits. The patient says that this morning upon waking up she did notice weakness or bilateral lower extremities but otherwise did not report any focal weakness.   Past Medical History:  Diagnosis Date  . Anxiety   . Broken arm    left FA 3/17  . Broken arm    left  . Depression   . Hyperlipidemia   . Hypertension   . Hypothyroid   . Myocardial infarction ([place])   . Neck pain 06/04/2014  . Spinal stenosis     Patient Active Problem List   Diagnosis Date Noted  . TIA (transient ischemic attack) 11/17/2016  . Parkinson disease ([place]) 10/27/2016  . Tremor 10/27/2016  . Chronic knee pain (B) (R>L) 10/19/2016  . Chest pain 09/29/2016  . Abnormal CT scan, lumbar spine 09/08/2016  . Lumbar foraminal stenosis (multilevel) 09/08/2016  . Fracture of clavicle 08/03/2016  . Polyneuropathy 05/17/2016  . Neurogenic pain 04/12/2016  . Chronic lower extremity pain (Location of Primary Source of Pain) (Bilateral) (R>L) 04/12/2016  . Chronic lower extremity radicular pain (Location of Primary Source of Pain) (Bilateral) (R>L) 04/12/2016  . Lower  extremity weakness 04/12/2016  . Chronic low back pain (Location of Secondary source of pain) (Bilateral) (R>L) 04/12/2016  . Chronic wrist pain (Left) (secondary to fracture) 04/12/2016  . Lumbar spondylosis 04/12/2016  . Chronic pain syndrome 04/11/2016  . Long term current use of opiate analgesic 04/11/2016  . Long term prescription opiate use 04/11/2016  . Opiate use 04/11/2016  . Long term prescription benzodiazepine use 04/11/2016  . Lumbar radiculopathy (Multilevel) (Bilateral) 05/07/2015  . Lumbar facet syndrome (Bilateral) (R>L) 08/21/2014  . 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 Sacroiliac joint dysfunction (Bilateral) 08/13/2014  . Frequent falls 06/04/2014  . Memory loss 06/04/2014  . Anxiety and depression 08/22/2013  . CAD (coronary artery disease) 08/22/2013  . Therapeutic opioid-induced constipation (OIC) 08/22/2013  . GERD (gastroesophageal reflux disease) 08/22/2013    Past Surgical History:  Procedure Laterality Date  . ABDOMINAL HYSTERECTOMY    . CORONARY ARTERY BYPASS GRAFT    . CORONARY STENT PLACEMENT    . s/p pacer insertion      Prior to Admission medications   Medication Sig Start Date End Date Taking? Authorizing Provider  cholecalciferol (VITAMIN D) 1000 UNITS tablet Take 1,000 Units by mouth daily. Reported on 07/08/2015    [provider]  clonazePAM (KLONOPIN) 0.5 MG tablet Take 0.5 mg by mouth 2 (two) times daily.    [provider]  clopidogrel (PLAVIX) 75 MG tablet Take 1 tablet (75 mg total) by mouth daily. 11/17/16   Sainani,  Belia Heman, MD  docusate sodium (COLACE) 100 MG capsule Take 100 mg by mouth 2 (two) times daily.    [provider]  fluticasone (FLONASE) 50 MCG/ACT nasal spray Place 2 sprays into both nostrils daily.    [provider]  furosemide (LASIX) 40 MG tablet Take 40 mg by mouth 2 (two) times daily.    [provider]  gabapentin (NEURONTIN) 300 MG  capsule Take 1 capsule (300 mg total) by mouth 3 (three) times daily. Patient taking differently: Take 300 mg by mouth at bedtime.  11/09/16 12/09/16  Vevelyn Francois, NP  lamoTRIgine (LAMICTAL) 100 MG tablet take 100 mg by mouth three times daily    [provider]  levothyroxine (SYNTHROID, LEVOTHROID) 100 MCG tablet Take 100 mcg by mouth daily before breakfast.    [provider]  Linaclotide (LINZESS) 145 MCG CAPS capsule Take 145 mcg by mouth daily.    [provider]  loratadine (CLARITIN) 10 MG tablet Take 10 mg by mouth daily.    [provider]  Multiple Vitamins-Minerals (ALIVE WOMENS ENERGY PO) Take 1 tablet by mouth daily.    [provider]  nitroGLYCERIN (NITROSTAT) 0.4 MG SL tablet Place 0.4 mg under the tongue every 5 (five) minutes as needed for chest pain.  10/03/16   [provider]  omeprazole (PRILOSEC) 20 MG capsule Take 20 mg by mouth daily.    [provider]  potassium chloride SA (K-DUR,KLOR-CON) 20 MEQ tablet Take 20 mEq by mouth 2 (two) times daily.  08/18/16   [provider]  rosuvastatin (CRESTOR) 20 MG tablet Take 20 mg by mouth daily.    [provider]  sertraline (ZOLOFT) 100 MG tablet Take 50-100 mg by mouth 2 (two) times daily. Take 100 mg in the am and 50 mg at bedtime.    [provider]  thiothixene (NAVANE) 5 MG capsule Take 5 mg by mouth 2 (two) times daily. 08/09/16   [provider]  traZODone (DESYREL) 50 MG tablet Take 50 mg by mouth at bedtime as needed for sleep.     [provider]  vitamin B-12 (CYANOCOBALAMIN) 500 MCG tablet Take 500 mcg by mouth daily.     [provider]    Allergies Penicillin g  Family History  Problem Relation Age of Onset  . Cancer Mother   . Depression Mother   . Diabetes Mother   . Hypertension Mother   . Heart disease Father     Social History Social History  Substance Use Topics  . Smoking status:  Never Smoker  . Smokeless tobacco: Never Used  . Alcohol use No    Review of Systems  Constitutional: No fever/chills Eyes: No visual changes. ENT: No sore throat. Cardiovascular: Denies chest pain. Respiratory: Denies shortness of breath. Gastrointestinal: No abdominal pain.  No nausea, no vomiting.  No diarrhea.  No constipation. Genitourinary: Negative for dysuria. Musculoskeletal: Negative for back pain. Skin: Negative for rash. Neurological: Negative for headaches, focal weakness or numbness.   ____________________________________________   PHYSICAL EXAM:  VITAL SIGNS: ED Triage Vitals  Enc Vitals Group     BP --      Pulse --      Resp --      Temp --      Temp src --      SpO2 --      Weight 12/04/16 1603 145 lb (65.8 kg)     Height 12/04/16 1603 5\' 7"  (1.702  m)     Head Circumference --      Peak Flow --      Pain Score 12/04/16 1602 7     Pain Loc --      Pain Edu? --      Excl. in [place]? --     Constitutional: Alert and oriented. Well appearing and in no acute distress. Eyes: Conjunctivae are normal.  Head: Atraumatic. Nose: No congestion/rhinnorhea. Mouth/Throat: Mucous membranes are moist.  Neck: No stridor.  no tenderness to palpation to the midline cervical spine. Cardiovascular: Normal rate, regular rhythm. Grossly normal heart sounds.  Good peripheral circulation. Respiratory: Normal respiratory effort.  No retractions. Lungs CTAB. Gastrointestinal: Soft and nontender. No distention. No CVA tenderness. usculoskeletal: No lower extremity tenderness nor edema.  No joint effusions. Neurologic:  Normal speech and language. Mild left sided facial drooping Skin:  Skin is warm, dry . Mild abrasion to the right low lumbar region about 2 x 3 cm and superficial without any active bleeding. Psychiatric: Mood and affect are normal. Speech and behavior are normal.  NIH Stroke Scale  Person Administering Scale: Doran Stabler  Administer stroke scale  items in the order listed. Record performance in each category after each subscale exam. Do not go back and change scores. Follow directions provided for each exam technique. Scores should reflect what the patient does, not what the clinician thinks the patient can do. The clinician should record answers while administering the exam and work quickly. Except where indicated, the patient should not be coached (i.e., repeated requests to patient to make a special effort).   1a  Level of consciousness: 0=alert; keenly responsive  1b. LOC questions:  0=Performs both tasks correctly  1c. LOC commands: 0=Performs both tasks correctly  2.  Best Gaze: 0=normal  3.  Visual: 0=No visual loss  4. Facial Palsy: 1=Minor paralysis (flattened nasolabial fold, asymmetric on smiling)  5a.  Motor left arm: 0=No drift, limb holds 90 (or 45) degrees for full 10 seconds  5b.  Motor right arm: 0=No drift, limb holds 90 (or 45) degrees for full 10 seconds  6a. motor left leg: 0=No drift, limb holds 90 (or 45) degrees for full 10 seconds  6b  Motor right leg:  0=No drift, limb holds 90 (or 45) degrees for full 10 seconds  7. Limb Ataxia: 0=Absent  8.  Sensory: 0=Normal; no sensory loss  9. Best Language:  0=No aphasia, normal  10. Dysarthria: 0=Normal  11. Extinction and Inattention: 0=No abnormality  12. Distal motor function: 0=Normal   Total:   1   ____________________________________________   LABS (all labs ordered are listed, but only abnormal results are displayed)  Labs Reviewed  CBC - Abnormal; Notable for the following:       Result Value   RDW 17.1 (*)    Platelets 120 (*)    All other components within normal limits  COMPREHENSIVE METABOLIC PANEL - Abnormal; Notable for the following:    Glucose, Bld 129 (*)    BUN 33 (*)    Creatinine, Ser 1.43 (*)    GFR calc non Af Amer 34 (*)    GFR calc Af Amer 40 (*)    All other components within normal limits  TROPONIN I - Abnormal; Notable for the  following:    Troponin I 0.03 (*)    All other components within normal limits  URINALYSIS, ROUTINE W REFLEX MICROSCOPIC - Abnormal; Notable for the following:    Color,  Urine YELLOW (*)    APPearance CLEAR (*)    Glucose, UA 50 (*)    All other components within normal limits  ETHANOL  PROTIME-INR  APTT  DIFFERENTIAL  URINE DRUG SCREEN, QUALITATIVE ([facility] ONLY)  TROPONIN I   ____________________________________________  EKG  ED ECG REPORT I, [place],  Youlanda Roys, the attending physician, personally viewed and interpreted this ECG.   Date: 12/04/2016  EKG Time: 1626  Rate: 65  Rhythm: vent pacing  Intervals:  Wide complex c/w vent pacing  ST&T Change: changes c/w vent pacing.  No stemi.   ____________________________________________  RADIOLOGY  no acute finding on CT of the brain. ____________________________________________   PROCEDURES  Procedure(s) performed:   Procedures  Critical Care performed:   ____________________________________________   INITIAL IMPRESSION / ASSESSMENT AND PLAN / ED COURSE  Pertinent labs & imaging results that were available during my care of the patient were reviewed by me and considered in my medical decision making (see chart for details).  ----------------------------------------- 4:22 PM on 12/04/2016 -----------------------------------------  Patient without known exact onset of left-sided facial drooping. She says that she last felt normal yesterday but also says that she has been feeling increasingly weak over the past several weeks since having a TIA. Because of this unclear time of onset which would be longer than 4.5 hours and the absence of an LDL positive scale, the patient is not a candidate for TPA at this time.     ----------------------------------------- 5:40 PM on 12/04/2016 -----------------------------------------  On review of the notes from the patient's previous admission for TIA, the left-sided facial  droop was noted at that time as well.  ----------------------------------------- 8:06 PM on 12/04/2016 -----------------------------------------  Patient without complaints at this time. Has been monitored for hours in the emergency department without any arrhythmia. A pressure of 123/76. Second troponin is undetectable. Slight renal insufficiency. Will receive fluids. Pending repeat of pacemaker interrogation at this time. Signed out to Dr. Alfred Levins.    ____________________________________________   FINAL CLINICAL IMPRESSION(S) / ED DIAGNOSES  Final diagnoses:  Near syncope  Abrasion      NEW MEDICATIONS STARTED DURING THIS VISIT:  New Prescriptions   No medications on file     Note:  This document was prepared using Dragon voice recognition software and may include unintentional dictation errors.     Orbie Pyo, MD 12/04/16 2007  Pt with decreased renal function with pre renal pattern.  Given fluids.      Orbie Pyo, MD 12/04/16 2012

## 2016-12-04 NOTE — ED Notes (Signed)
Pts pacemaker interrogated  

## 2016-12-07 ENCOUNTER — Encounter: Payer: Self-pay | Admitting: Pain Medicine

## 2016-12-07 ENCOUNTER — Ambulatory Visit: Payer: Medicare Other | Attending: Pain Medicine | Admitting: Pain Medicine

## 2016-12-07 VITALS — BP 111/67 | HR 62 | Temp 97.7°F | Resp 16 | Ht 67.0 in | Wt 145.0 lb

## 2016-12-07 DIAGNOSIS — M79605 Pain in left leg: Secondary | ICD-10-CM | POA: Diagnosis not present

## 2016-12-07 DIAGNOSIS — I252 Old myocardial infarction: Secondary | ICD-10-CM | POA: Diagnosis not present

## 2016-12-07 DIAGNOSIS — M545 Low back pain: Secondary | ICD-10-CM | POA: Diagnosis present

## 2016-12-07 DIAGNOSIS — M542 Cervicalgia: Secondary | ICD-10-CM | POA: Diagnosis not present

## 2016-12-07 DIAGNOSIS — G459 Transient cerebral ischemic attack, unspecified: Secondary | ICD-10-CM | POA: Insufficient documentation

## 2016-12-07 DIAGNOSIS — M5441 Lumbago with sciatica, right side: Secondary | ICD-10-CM

## 2016-12-07 DIAGNOSIS — E782 Mixed hyperlipidemia: Secondary | ICD-10-CM | POA: Insufficient documentation

## 2016-12-07 DIAGNOSIS — I219 Acute myocardial infarction, unspecified: Secondary | ICD-10-CM | POA: Insufficient documentation

## 2016-12-07 DIAGNOSIS — F32A Depression, unspecified: Secondary | ICD-10-CM | POA: Insufficient documentation

## 2016-12-07 DIAGNOSIS — G894 Chronic pain syndrome: Secondary | ICD-10-CM | POA: Diagnosis not present

## 2016-12-07 DIAGNOSIS — E039 Hypothyroidism, unspecified: Secondary | ICD-10-CM | POA: Diagnosis not present

## 2016-12-07 DIAGNOSIS — F419 Anxiety disorder, unspecified: Secondary | ICD-10-CM | POA: Insufficient documentation

## 2016-12-07 DIAGNOSIS — M4696 Unspecified inflammatory spondylopathy, lumbar region: Secondary | ICD-10-CM

## 2016-12-07 DIAGNOSIS — I639 Cerebral infarction, unspecified: Secondary | ICD-10-CM | POA: Insufficient documentation

## 2016-12-07 DIAGNOSIS — Z8673 Personal history of transient ischemic attack (TIA), and cerebral infarction without residual deficits: Secondary | ICD-10-CM | POA: Diagnosis not present

## 2016-12-07 DIAGNOSIS — M5416 Radiculopathy, lumbar region: Secondary | ICD-10-CM

## 2016-12-07 DIAGNOSIS — M5410 Radiculopathy, site unspecified: Secondary | ICD-10-CM | POA: Diagnosis not present

## 2016-12-07 DIAGNOSIS — G8929 Other chronic pain: Secondary | ICD-10-CM | POA: Diagnosis not present

## 2016-12-07 DIAGNOSIS — Z950 Presence of cardiac pacemaker: Secondary | ICD-10-CM | POA: Diagnosis not present

## 2016-12-07 DIAGNOSIS — G20 Parkinson's disease: Secondary | ICD-10-CM | POA: Diagnosis not present

## 2016-12-07 DIAGNOSIS — I2510 Atherosclerotic heart disease of native coronary artery without angina pectoris: Secondary | ICD-10-CM | POA: Diagnosis not present

## 2016-12-07 DIAGNOSIS — R296 Repeated falls: Secondary | ICD-10-CM | POA: Insufficient documentation

## 2016-12-07 DIAGNOSIS — M5442 Lumbago with sciatica, left side: Secondary | ICD-10-CM | POA: Diagnosis not present

## 2016-12-07 DIAGNOSIS — M25532 Pain in left wrist: Secondary | ICD-10-CM | POA: Insufficient documentation

## 2016-12-07 DIAGNOSIS — K219 Gastro-esophageal reflux disease without esophagitis: Secondary | ICD-10-CM | POA: Insufficient documentation

## 2016-12-07 DIAGNOSIS — M79604 Pain in right leg: Secondary | ICD-10-CM

## 2016-12-07 DIAGNOSIS — M47816 Spondylosis without myelopathy or radiculopathy, lumbar region: Secondary | ICD-10-CM | POA: Insufficient documentation

## 2016-12-07 DIAGNOSIS — M48061 Spinal stenosis, lumbar region without neurogenic claudication: Secondary | ICD-10-CM | POA: Insufficient documentation

## 2016-12-07 DIAGNOSIS — R413 Other amnesia: Secondary | ICD-10-CM | POA: Insufficient documentation

## 2016-12-07 DIAGNOSIS — F329 Major depressive disorder, single episode, unspecified: Secondary | ICD-10-CM | POA: Diagnosis not present

## 2016-12-07 DIAGNOSIS — M48062 Spinal stenosis, lumbar region with neurogenic claudication: Secondary | ICD-10-CM | POA: Diagnosis not present

## 2016-12-07 DIAGNOSIS — I10 Essential (primary) hypertension: Secondary | ICD-10-CM | POA: Insufficient documentation

## 2016-12-07 DIAGNOSIS — Z79891 Long term (current) use of opiate analgesic: Secondary | ICD-10-CM | POA: Diagnosis not present

## 2016-12-07 DIAGNOSIS — I200 Unstable angina: Secondary | ICD-10-CM | POA: Diagnosis not present

## 2016-12-07 NOTE — Progress Notes (Signed)
Patient's Name: Kristi [name]  MRN: [id number]  Referring Provider: Lorelee Market, MD  DOB: 11/19/1940  PCP: Lorelee Market, MD  DOS: 12/07/2016  Note by: Gaspar Cola, MD  Service setting: Ambulatory outpatient  Specialty: Interventional Pain Management  Location: [facility] (AMB) Pain Management Facility    Patient type: Established   Primary Reason(s) for Visit: Encounter for post-procedure evaluation of chronic illness with mild to moderate exacerbation CC: Back Pain (low)  HPI  Kristi [name] is a 76 y.o. year old, female patient, who comes today for a post-procedure evaluation. 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, L3-4, L4-5); Sacroiliac joint dysfunction (Bilateral); Lumbar facet syndrome (Bilateral) (R>L); Lumbar radiculopathy (Multilevel) (Bilateral); Anxiety and depression; CAD (coronary artery disease); Therapeutic opioid-induced constipation (OIC); Frequent falls; GERD (gastroesophageal reflux disease); Memory loss; Chronic pain syndrome; Long term current use of opiate analgesic; Long term prescription opiate use; Opiate use; Long term prescription benzodiazepine use; Neurogenic pain; Chronic lower extremity pain (Bilateral) (R>L); Chronic lower extremity radicular pain (Primary Source of Pain) (Bilateral) (R>L); Lower extremity weakness; Chronic low back pain (Secondary source of pain) (Bilateral) (R>L); Chronic wrist pain (Left) (secondary to fracture); Lumbar spondylosis; Fracture of clavicle; Polyneuropathy; Abnormal CT scan, lumbar spine; Lumbar foraminal stenosis (multilevel); Chest pain; Chronic knee pain (B) (R>L); Parkinson disease ([place]); Tremor; TIA (transient ischemic attack); Anxiety; Cardiac pacemaker in situ; Cerebrovascular accident [facility]); Chronic depression; Essential hypertension; Hypothyroidism; Mixed hyperlipidemia; and Myocardial infarction ([place]) on her problem list. Her primarily concern today is the Back Pain (low)  Pain Assessment: Location:  Lower Back Radiating: radiates down both legs to ankles in entire leg Onset: More than a month ago Duration: Chronic pain Quality: Aching, Sore, Tender Severity: 5/10 (self-reported pain score)  Note: Reported level is compatible with observation.                   Timing: Constant Modifying factors: lying down and meds  Kristi [name] comes in today for post-procedure evaluation after the treatment done on 11/15/2016.  Further details on both, my assessment(s), as well as the proposed treatment plan, please see below.  Post-Procedure Assessment  11/15/2016 Procedure: Diagnostic bilateral lumbar facet block under fluoroscopic guidance and IV sedation Pre-procedure pain score:  5/10 Post-procedure pain score: 0/10 (100% relief) Influential Factors: BMI: 22.71 kg/m Intra-procedural challenges: None observed.         Assessment challenges: None detected.              Reported side-effects: None.        Post-procedural adverse reactions or complications: None reported         Sedation: Sedation provided. When no sedatives are used, the analgesic levels obtained are directly associated to the effectiveness of the local anesthetics. However, when sedation is provided, the level of analgesia obtained during the initial 1 hour following the intervention, is believed to be the result of a combination of factors. These factors may include, but are not limited to: 1. The effectiveness of the local anesthetics used. 2. The effects of the analgesic(s) and/or anxiolytic(s) used. 3. The degree of discomfort experienced by the patient at the time of the procedure. 4. The patients ability and reliability in recalling and recording the events. 5. The presence and influence of possible secondary gains and/or psychosocial factors. Reported result: Relief experienced during the 1st hour after the procedure: 100 % (Ultra-Short Term Relief)            Interpretative annotation: Clinically appropriate result.  Analgesia  during this period is likely to be Local Anesthetic and/or IV Sedative (Analgesic/Anxiolytic) related.          Effects of local anesthetic: The analgesic effects attained during this period are directly associated to the localized infiltration of local anesthetics and therefore cary significant diagnostic value as to the etiological location, or anatomical origin, of the pain. Expected duration of relief is directly dependent on the pharmacodynamics of the local anesthetic used. Long-acting (4-6 hours) anesthetics used.  Reported result: Relief during the next 4 to 6 hour after the procedure: 80 % (Short-Term Relief)            Interpretative annotation: Clinically appropriate result. Analgesia during this period is likely to be Local Anesthetic-related.          Long-term benefit: Defined as the period of time past the expected duration of local anesthetics (1 hour for short-acting and 4-6 hours for long-acting). With the possible exception of prolonged sympathetic blockade from the local anesthetics, benefits during this period are typically attributed to, or associated with, other factors such as analgesic sensory neuropraxia, antiinflammatory effects, or beneficial biochemical changes provided by agents other than the local anesthetics.  Reported result: Extended relief following procedure: 0 % (Long-Term Relief)            Interpretative annotation: Clinically possible results. No benefit. Incomplete therapeutic success. Etiology is likely mechanical rather than inflammatory.          Current benefits: Defined as persistent relief that continues at this point in time.   Reported results: Treated area: 0 % [name] reports improvement in function Interpretative annotation: Recurrence of symptoms. No permanent benefit expected. Effective diagnostic intervention.          Interpretation: Results would suggest a successful diagnostic intervention. The patient has failed to respond to  conservative therapies including over-the-counter medications, anti-inflammatories, muscle relaxants, membrane stabilizers, opioids, physical therapy modalities such as heat and ice, as well as more invasive techniques such as nerve blocks. Because Ms. Plessinger did attain more than 50% relief of the pain during a series of diagnostic blocks conducted in separate occasions, I believe it is medically necessary to proceed with Radiofrequency Ablation, in order to attempt gaining longer relief.          Plan:  Proceed with Radiofrequency Ablation for the purpose of attaining long-term benefits.  Laboratory Chemistry  Inflammation Markers (CRP: Acute Phase) (ESR: Chronic Phase) Lab Results  Component Value Date   CRP <0.8 04/12/2016   ESRSEDRATE 6 04/12/2016                 Renal Function Markers Lab Results  Component Value Date   BUN 33 (H) 12/04/2016   CREATININE 1.43 (H) 12/04/2016   GFRAA 40 (L) 12/04/2016   GFRNONAA 34 (L) 12/04/2016                 Hepatic Function Markers Lab Results  Component Value Date   AST 30 12/04/2016   ALT 40 12/04/2016   ALBUMIN 3.7 12/04/2016   ALKPHOS 90 12/04/2016                 Electrolytes Lab Results  Component Value Date   NA 140 12/04/2016   K 3.7 12/04/2016   CL 102 12/04/2016   CALCIUM 9.0 12/04/2016   MG 2.1 04/12/2016                 Neuropathy Markers Lab Results  Component Value Date  VITAMINB12 1,406 (H) 04/12/2016                 Bone Pathology Markers Lab Results  Component Value Date   ALKPHOS 90 12/04/2016   25OHVITD1 49 04/12/2016   25OHVITD2 <1.0 04/12/2016   25OHVITD3 49 04/12/2016   CALCIUM 9.0 12/04/2016                 Coagulation Parameters Lab Results  Component Value Date   INR 0.86 12/04/2016   LABPROT 11.6 12/04/2016   APTT 25 12/04/2016   PLT 120 (L) 12/04/2016                 Cardiovascular Markers Lab Results  Component Value Date   BNP 111.0 (H) 08/28/2016   HGB 12.3 12/04/2016   HCT  35.9 12/04/2016                 Note: Lab results reviewed.  Recent Diagnostic Imaging Review  Dg Ribs Unilateral W/chest Right Result Date: 12/04/2016 CLINICAL DATA:  Fall with rib pain EXAM: RIGHT RIBS AND CHEST - 3+ VIEW COMPARISON:  Chest radiograph 09/29/2016 FINDINGS: Left chest wall pacemaker leads are in unchanged position. There is shrapnel overlying the left chest. Unchanged cardiomediastinal contours. The lungs are clear. There is no rib fracture. IMPRESSION: No rib fracture.  Clear lungs. Electronically Signed   By: Ulyses Jarred M.D.   On: 12/04/2016 17:40   Ct Head Wo Contrast Result Date: 12/04/2016 CLINICAL DATA:  Focal neurologic deficit for greater than 6 hours. EXAM: CT HEAD WITHOUT CONTRAST TECHNIQUE: Contiguous axial images were obtained from the base of the skull through the vertex without intravenous contrast. COMPARISON:  11/17/2016 FINDINGS: Brain: Prominence of the sulci and ventricles identified compatible with brain atrophy. Mild low attenuation within the periventricular white matter compatible with chronic small vessel ischemic disease. Vascular: No hyperdense vessel or unexpected calcification. Skull: Normal. Negative for fracture or focal lesion. Sinuses/Orbits: The paranasal sinuses are clear. There is partial opacification of the right mastoid air cells. Other: None. IMPRESSION: 1. No acute findings 2. Chronic small vessel ischemic change and brain atrophy. Electronically Signed   By: Kerby Moors M.D.   On: 12/04/2016 17:25   Note: Imaging results reviewed.          Meds   Current Outpatient Prescriptions:  .  carvedilol (COREG) 6.25 MG tablet, carvedilol 6.25 mg tablet  bid, Disp: , Rfl:  .  cholecalciferol (VITAMIN D) 1000 UNITS tablet, Take 1,000 Units by mouth daily. Reported on 07/08/2015, Disp: , Rfl:  .  clonazePAM (KLONOPIN) 0.5 MG tablet, Take 0.5 mg by mouth 2 (two) times daily., Disp: , Rfl:  .  clopidogrel (PLAVIX) 75 MG tablet, Take 1 tablet (75 mg  total) by mouth daily., Disp: 30 tablet, Rfl: 1 .  docusate sodium (COLACE) 100 MG capsule, Take 100 mg by mouth 2 (two) times daily., Disp: , Rfl:  .  fluticasone (FLONASE) 50 MCG/ACT nasal spray, Place 2 sprays into both nostrils daily., Disp: , Rfl:  .  furosemide (LASIX) 40 MG tablet, Take 40 mg by mouth 2 (two) times daily., Disp: , Rfl:  .  gabapentin (NEURONTIN) 300 MG capsule, Take 1 capsule (300 mg total) by mouth 3 (three) times daily. (Patient taking differently: Take 300 mg by mouth at bedtime. ), Disp: 90 capsule, Rfl: 0 .  lamoTRIgine (LAMICTAL) 100 MG tablet, take 100 mg by mouth three times daily, Disp: , Rfl:  .  levothyroxine (SYNTHROID, LEVOTHROID)  100 MCG tablet, Take 100 mcg by mouth daily before breakfast., Disp: , Rfl:  .  Linaclotide (LINZESS) 145 MCG CAPS capsule, Take 145 mcg by mouth daily., Disp: , Rfl:  .  loratadine (CLARITIN) 10 MG tablet, Take 10 mg by mouth daily., Disp: , Rfl:  .  Multiple Vitamins-Minerals (ALIVE WOMENS ENERGY PO), Take 1 tablet by mouth daily., Disp: , Rfl:  .  nitroGLYCERIN (NITROSTAT) 0.4 MG SL tablet, Place 0.4 mg under the tongue every 5 (five) minutes as needed for chest pain. , Disp: , Rfl:  .  olopatadine (PATANOL) 0.1 % ophthalmic solution, 1 drop 2 (two) times daily., Disp: , Rfl:  .  omeprazole (PRILOSEC) 20 MG capsule, Take 20 mg by mouth daily., Disp: , Rfl:  .  potassium chloride SA (K-DUR,KLOR-CON) 20 MEQ tablet, Take 20 mEq by mouth 2 (two) times daily. , Disp: , Rfl:  .  rosuvastatin (CRESTOR) 20 MG tablet, Take 20 mg by mouth daily., Disp: , Rfl:  .  sertraline (ZOLOFT) 100 MG tablet, Take 50-100 mg by mouth 2 (two) times daily. Take 100 mg in the am and 50 mg at bedtime., Disp: , Rfl:  .  thiothixene (NAVANE) 5 MG capsule, Take 5 mg by mouth 2 (two) times daily., Disp: , Rfl: 2 .  traZODone (DESYREL) 50 MG tablet, Take 50 mg by mouth at bedtime as needed for sleep. , Disp: , Rfl:  .  vitamin B-12 (CYANOCOBALAMIN) 500 MCG tablet,  Take 500 mcg by mouth daily. , Disp: , Rfl:   ROS  Constitutional: Denies any fever or chills Gastrointestinal: No reported hemesis, hematochezia, vomiting, or acute GI distress Musculoskeletal: Denies any acute onset joint swelling, redness, loss of ROM, or weakness Neurological: No reported episodes of acute onset apraxia, aphasia, dysarthria, agnosia, amnesia, paralysis, loss of coordination, or loss of consciousness  Allergies  Ms. Briggs is allergic to penicillin g.  PFSH  Drug: Ms. Rieves  reports that she does not use drugs. Alcohol:  reports that she does not drink alcohol. Tobacco:  reports that she has never smoked. She has never used smokeless tobacco. Medical:  has a past medical history of Anxiety; Broken arm; Broken arm; Depression; Hyperlipidemia; Hypertension; Hypothyroid; Myocardial infarction ([place]); Neck pain (06/04/2014); and Spinal stenosis. Surgical: Ms. Esteban  has a past surgical history that includes Abdominal hysterectomy; Coronary stent placement; Coronary artery bypass graft; and s/p pacer insertion. Family: family history includes Cancer in her mother; Depression in her mother; Diabetes in her mother; Heart disease in her father; Hypertension in her mother.  Constitutional Exam  General appearance: Well nourished, well developed, and well hydrated. In no apparent acute distress Vitals:   12/07/16 1418  BP: 111/67  Pulse: 62  Resp: 16  Temp: 97.7 F (36.5 C)  SpO2: 96%  Weight: 145 lb (65.8 kg)  Height: _0  (1.702 m)   BMI Assessment: Estimated body mass index is 22.71 kg/m as calculated from the following:   Height as of this encounter: _1  (1.702 m).   Weight as of this encounter: 145 lb (65.8 kg).  BMI interpretation table: BMI level Category Range association with higher incidence of chronic pain  <18 kg/m2 Underweight   18.5-24.9 kg/m2 Ideal body weight   25-29.9 kg/m2 Overweight Increased incidence by 20%  30-34.9 kg/m2 Obese (Class I)  Increased incidence by 68%  35-39.9 kg/m2 Severe obesity (Class II) Increased incidence by 136%  >40 kg/m2 Extreme obesity (Class III) Increased incidence by 254%  BMI Readings from Last 4 Encounters:  12/07/16 22.71 kg/m  12/04/16 22.71 kg/m  11/17/16 23.40 kg/m  11/15/16 23.40 kg/m   Wt Readings from Last 4 Encounters:  12/07/16 145 lb (65.8 kg)  12/04/16 145 lb (65.8 kg)  11/17/16 145 lb (65.8 kg)  11/15/16 145 lb (65.8 kg)  Psych/Mental status: Alert, oriented x 3 (person, place, & time)       Eyes: PERLA Respiratory: No evidence of acute respiratory distress  Cervical Spine Area Exam  Skin & Axial Inspection: No masses, redness, edema, swelling, or associated skin lesions Alignment: Symmetrical Functional ROM: Unrestricted ROM      Stability: No instability detected Muscle Tone/Strength: Functionally intact. No obvious neuro-muscular anomalies detected. Sensory (Neurological): Unimpaired Palpation: No palpable anomalies              Upper Extremity (UE) Exam    Side: Right upper extremity  Side: Left upper extremity  Skin & Extremity Inspection: Skin color, temperature, and hair growth are WNL. No peripheral edema or cyanosis. No masses, redness, swelling, asymmetry, or associated skin lesions. No contractures.  Skin & Extremity Inspection: Skin color, temperature, and hair growth are WNL. No peripheral edema or cyanosis. No masses, redness, swelling, asymmetry, or associated skin lesions. No contractures.  Functional ROM: Unrestricted ROM          Functional ROM: Unrestricted ROM          Muscle Tone/Strength: Functionally intact. No obvious neuro-muscular anomalies detected.  Muscle Tone/Strength: Functionally intact. No obvious neuro-muscular anomalies detected.  Sensory (Neurological): Unimpaired          Sensory (Neurological): Unimpaired          Palpation: No palpable anomalies              Palpation: No palpable anomalies              Specialized Test(s): Deferred          Specialized Test(s): Deferred          Thoracic Spine Area Exam  Skin & Axial Inspection: No masses, redness, or swelling Alignment: Symmetrical Functional ROM: Unrestricted ROM Stability: No instability detected Muscle Tone/Strength: Functionally intact. No obvious neuro-muscular anomalies detected. Sensory (Neurological): Unimpaired Muscle strength & Tone: No palpable anomalies  Lumbar Spine Area Exam  Skin & Axial Inspection: No masses, redness, or swelling Alignment: Symmetrical Functional ROM: Improved after treatment      Stability: No instability detected Muscle Tone/Strength: Functionally intact. No obvious neuro-muscular anomalies detected. Sensory (Neurological): Movement-associated pain Palpation: Complains of area being tender to palpation       Provocative Tests: Lumbar Hyperextension and rotation test: Positive bilaterally for facet joint pain. Lumbar Lateral bending test: evaluation deferred today       Patrick's Maneuver: evaluation deferred today                    Gait & Posture Assessment  Ambulation: Unassisted Gait: Relatively normal for age and body habitus Posture: WNL   Lower Extremity Exam    Side: Right lower extremity  Side: Left lower extremity  Skin & Extremity Inspection: Skin color, temperature, and hair growth are WNL. No peripheral edema or cyanosis. No masses, redness, swelling, asymmetry, or associated skin lesions. No contractures.  Skin & Extremity Inspection: Skin color, temperature, and hair growth are WNL. No peripheral edema or cyanosis. No masses, redness, swelling, asymmetry, or associated skin lesions. No contractures.  Functional ROM: Unrestricted ROM  Functional ROM: Unrestricted ROM          Muscle Tone/Strength: Functionally intact. No obvious neuro-muscular anomalies detected.  Muscle Tone/Strength: Functionally intact. No obvious neuro-muscular anomalies detected.  Sensory (Neurological): Unimpaired  Sensory  (Neurological): Unimpaired  Palpation: No palpable anomalies  Palpation: No palpable anomalies   Assessment  Primary Diagnosis & Pertinent Problem List: The primary encounter diagnosis was Chronic low back pain (Location of Secondary source of pain) (Bilateral) (R>L). 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  Status Diagnosis  Persistent Persistent Persistent 1. Chronic low back pain (Location of Secondary source of pain) (Bilateral) (R>L)   2. Lumbar facet syndrome (Bilateral) (R>L)   3. Chronic lower extremity pain (Location of Primary Source of Pain) (Bilateral) (R>L)   4. Chronic lower extremity radicular pain (Location of Primary Source of Pain) (Bilateral) (R>L)   5. Lumbar radiculopathy (Multilevel) (Bilateral)   6. 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  New Prescriptions   No medications on file   Medications administered today: Ms. Socarras had no medications administered during this visit.   Procedure Orders     Radiofrequency,Lumbar     Lumbar Transforaminal Epidural Lab Orders  No laboratory test(s) ordered today   Imaging Orders  No imaging studies ordered today    Referral Orders      Ambulatory referral to Neurosurgery  Interventional management options: Planned, scheduled, and/or pending:   Therapeutic right-sided lumbar facet RFA under fluoroscopic guidance and IV sedation    Considering:   Diagnostic lumbar epidural steroid injectionunder fluoroscopic guidance  Diagnostic bilateral transforaminal epidural steroid injections Diagnostic bilateral lumbar facet blocks Possible bilateral lumbar facet RFA Diagnostic bilateral SI joint blocks Possible bilateral SI joint RFA   Palliative PRN treatment(s):   None at this time   Provider-requested follow-up: Return for RFA procedure under fluoro and sedation: (R) L-FCT.  Future Appointments Date Time Provider [place]  01/17/2017 2:00 PM Milinda Pointer, MD [facility] None   Primary Care Physician: Lorelee Market, MD Location: [facility] Note by: Gaspar Cola, MD Date: 12/07/2016; Time: 3:08 PM

## 2016-12-07 NOTE — Patient Instructions (Signed)

## 2016-12-07 NOTE — Progress Notes (Signed)
Safety precautions to be maintained throughout the outpatient stay will include: orient to surroundings, keep bed in low position, maintain call bell within reach at all times, provide assistance with transfer out of bed and ambulation. Patient states she was hospitalized twice in 3 weeks from falling.  States her legs gave out and they get real weak and they wont work.

## 2016-12-18 ENCOUNTER — Emergency Department
Admission: EM | Admit: 2016-12-18 | Discharge: 2016-12-19 | Disposition: A | Discharge status: Home / Self Care | Payer: Medicare Other | Attending: Emergency Medicine | Admitting: Emergency Medicine

## 2016-12-18 ENCOUNTER — Encounter: Payer: Self-pay | Admitting: Emergency Medicine

## 2016-12-18 DIAGNOSIS — Z950 Presence of cardiac pacemaker: Secondary | ICD-10-CM | POA: Diagnosis not present

## 2016-12-18 DIAGNOSIS — G20 Parkinson's disease: Secondary | ICD-10-CM | POA: Insufficient documentation

## 2016-12-18 DIAGNOSIS — Z79899 Other long term (current) drug therapy: Secondary | ICD-10-CM | POA: Insufficient documentation

## 2016-12-18 DIAGNOSIS — Z8673 Personal history of transient ischemic attack (TIA), and cerebral infarction without residual deficits: Secondary | ICD-10-CM | POA: Insufficient documentation

## 2016-12-18 DIAGNOSIS — S46911A Strain of unspecified muscle, fascia and tendon at shoulder and upper arm level, right arm, initial encounter: Secondary | ICD-10-CM | POA: Insufficient documentation

## 2016-12-18 DIAGNOSIS — Y92009 Unspecified place in unspecified non-institutional (private) residence as the place of occurrence of the external cause: Secondary | ICD-10-CM | POA: Insufficient documentation

## 2016-12-18 DIAGNOSIS — W19XXXA Unspecified fall, initial encounter: Secondary | ICD-10-CM

## 2016-12-18 DIAGNOSIS — Z7901 Long term (current) use of anticoagulants: Secondary | ICD-10-CM | POA: Diagnosis not present

## 2016-12-18 DIAGNOSIS — E039 Hypothyroidism, unspecified: Secondary | ICD-10-CM | POA: Insufficient documentation

## 2016-12-18 DIAGNOSIS — Y939 Activity, unspecified: Secondary | ICD-10-CM | POA: Diagnosis not present

## 2016-12-18 DIAGNOSIS — Z7982 Long term (current) use of aspirin: Secondary | ICD-10-CM | POA: Diagnosis not present

## 2016-12-18 DIAGNOSIS — Y999 Unspecified external cause status: Secondary | ICD-10-CM | POA: Insufficient documentation

## 2016-12-18 DIAGNOSIS — R55 Syncope and collapse: Secondary | ICD-10-CM | POA: Insufficient documentation

## 2016-12-18 DIAGNOSIS — I25810 Atherosclerosis of coronary artery bypass graft(s) without angina pectoris: Secondary | ICD-10-CM | POA: Insufficient documentation

## 2016-12-18 DIAGNOSIS — W0110XA Fall on same level from slipping, tripping and stumbling with subsequent striking against unspecified object, initial encounter: Secondary | ICD-10-CM | POA: Diagnosis not present

## 2016-12-18 DIAGNOSIS — I10 Essential (primary) hypertension: Secondary | ICD-10-CM | POA: Diagnosis not present

## 2016-12-18 NOTE — ED Provider Notes (Signed)
[facility] Emergency Department Provider Note  ____________________________________________   First MD Initiated Contact with Patient 12/18/16 2255     (approximate)  I have reviewed the triage vital signs and the nursing notes.   HISTORY  Chief Complaint Fall; Hypotension; and Hip Pain (left)    HPI Kristi [name] is a 76 y.o. female with an indwelling pacemaker and history of multiple recent falls and multiple near syncopal episodes who presents by EMS after a reported fall and low blood pressure.  She states that she got up and try to go to the door but her legs became weak and she fell but did not completely lose consciousness.  She reports that she bumped her head but it is not currently hurting and she denies neck pain.  She also seems to have a small bruise or contusion on her right lateral posterior ribs but this does not appear acute and she has no tenderness to the area.  She denies chest pain, shortness of breath, palpitations, nausea, vomiting, and abdominal pain.  She has no pain in her legs.  The onset of the symptoms was acute.  Lying down and getting fluids by EMS seems to make it better, nothing makes it worse.   Past Medical History:  Diagnosis Date  . Anxiety   . Broken arm    left FA 3/17  . Broken arm    left  . Depression   . Hyperlipidemia   . Hypertension   . Hypothyroid   . Myocardial infarction ([place])   . Neck pain 06/04/2014  . Spinal stenosis     Patient Active Problem List   Diagnosis Date Noted  . Anxiety 12/07/2016  . 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 Cerebrovascular accident ([place]) 12/07/2016  . Chronic depression 12/07/2016  . Essential hypertension 12/07/2016  . Hypothyroidism 12/07/2016  . Mixed hyperlipidemia 12/07/2016  . Myocardial infarction ([place]) 12/07/2016  . TIA (transient ischemic attack) 11/17/2016  . Parkinson disease ([place]) 10/27/2016  . Tremor 10/27/2016  . Chronic knee pain (B) (R>L)  10/19/2016  . Chest pain 09/29/2016  . Abnormal CT scan, lumbar spine 09/08/2016  . Lumbar foraminal stenosis (multilevel) 09/08/2016  . Fracture of clavicle 08/03/2016  . Polyneuropathy 05/17/2016  . Neurogenic pain 04/12/2016  . Chronic lower extremity pain (Bilateral) (R>L) 04/12/2016  . Chronic lower extremity radicular pain (Primary Source of Pain) (Bilateral) (R>L) 04/12/2016  . Lower extremity weakness 04/12/2016  . Chronic low back pain (Secondary source of pain) (Bilateral) (R>L) 04/12/2016  . Chronic wrist pain (Left) (secondary to fracture) 04/12/2016  . Lumbar spondylosis 04/12/2016  . Chronic pain syndrome 04/11/2016  . Long term current use of opiate analgesic 04/11/2016  . Long term prescription opiate use 04/11/2016  . Opiate use 04/11/2016  . Long term prescription benzodiazepine use 04/11/2016  . Lumbar radiculopathy (Multilevel) (Bilateral) 05/07/2015  . Lumbar facet syndrome (Bilateral) (R>L) 08/21/2014  . 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 Sacroiliac joint dysfunction (Bilateral) 08/13/2014  . Frequent falls 06/04/2014  . Memory loss 06/04/2014  . Anxiety and depression 08/22/2013  . CAD (coronary artery disease) 08/22/2013  . Therapeutic opioid-induced constipation (OIC) 08/22/2013  . GERD (gastroesophageal reflux disease) 08/22/2013    Past Surgical History:  Procedure Laterality Date  . ABDOMINAL HYSTERECTOMY    . CORONARY ARTERY BYPASS GRAFT    . CORONARY STENT PLACEMENT    . s/p pacer insertion      Prior to Admission medications  Medication Sig Start Date End Date Taking? Authorizing Provider  aspirin EC 81 MG tablet Take 81 mg by mouth daily.   Yes [provider]  carvedilol (COREG) 6.25 MG tablet Take 6.25 mg by mouth 2 (two) times daily with a meal.   Yes [provider]  cholecalciferol (VITAMIN D) 1000 UNITS tablet Take 1,000 Units by mouth daily. Reported on 07/08/2015   Yes [provider]  clonazePAM (KLONOPIN) 0.5 MG tablet Take 0.5 mg by mouth 2 (two) times daily.   Yes [provider]  docusate sodium (COLACE) 100 MG capsule Take 100 mg by mouth 2 (two) times daily.   Yes [provider]  furosemide (LASIX) 40 MG tablet Take 40 mg by mouth 2 (two) times daily.   Yes [provider]  gabapentin (NEURONTIN) 300 MG capsule Take 1 capsule (300 mg total) by mouth 3 (three) times daily. Patient taking differently: Take 300 mg by mouth at bedtime.  11/09/16 12/19/16 Yes Vevelyn Francois, NP  lamoTRIgine (LAMICTAL) 100 MG tablet take 100 mg by mouth three times daily   Yes [provider]  levothyroxine (SYNTHROID, LEVOTHROID) 100 MCG tablet Take 100 mcg by mouth daily before breakfast.   Yes [provider]  Linaclotide (LINZESS) 145 MCG CAPS capsule Take 145 mcg by mouth daily.   Yes [provider]  loratadine (CLARITIN) 10 MG tablet Take 10 mg by mouth daily.   Yes [provider]  Multiple Vitamins-Minerals (ALIVE WOMENS ENERGY PO) Take 1 tablet by mouth daily.   Yes [provider]  nitrofurantoin, macrocrystal-monohydrate, (MACROBID) 100 MG capsule Take 1 capsule by mouth every 12 (twelve) hours. 12/11/16 12/21/16 Yes [provider]  olopatadine (PATANOL) 0.1 % ophthalmic solution 1 drop 2 (two) times daily.   Yes [provider]  omeprazole (PRILOSEC) 20 MG capsule Take 20 mg by mouth daily.   Yes [provider]  potassium chloride SA (K-DUR,KLOR-CON) 20 MEQ tablet Take 20 mEq by mouth 2 (two) times daily.  08/18/16  Yes [provider]  rosuvastatin (CRESTOR) 20 MG tablet Take 20 mg by mouth daily.   Yes [provider]  sertraline (ZOLOFT) 100 MG tablet Take 50-100 mg by mouth 2 (two) times daily. Take 100 mg in the am and 50 mg at bedtime.   Yes [provider]  thiothixene (NAVANE) 5 MG capsule Take 5 mg by mouth 2 (two) times daily. 08/09/16   Yes [provider]  vitamin B-12 (CYANOCOBALAMIN) 500 MCG tablet Take 500 mcg by mouth daily.    Yes [provider]  clopidogrel (PLAVIX) 75 MG tablet Take 1 tablet (75 mg total) by mouth daily. Patient not taking: Reported on 12/19/2016 11/17/16   Henreitta Leber, MD  fluticasone [facility]) 50 MCG/ACT nasal spray Place 2 sprays into both nostrils daily.    [provider]  nitroGLYCERIN (NITROSTAT) 0.4 MG SL tablet Place 0.4 mg under the tongue every 5 (five) minutes as needed for chest pain.  10/03/16   [provider]  traZODone (DESYREL) 50 MG tablet Take 50 mg by mouth at bedtime as needed for sleep.     [provider]    Allergies Penicillin g  Family History  Problem Relation Age of Onset  . Cancer Mother   . Depression Mother   . Diabetes Mother   . Hypertension Mother   . Heart disease Father     Social History Social History  Substance Use Topics  .  Smoking status: Never Smoker  . Smokeless tobacco: Never Used  . Alcohol use No    Review of Systems Constitutional: No fever/chills Eyes: No visual changes. Cardiovascular: Denies chest pain.  Near syncope. Respiratory: Denies shortness of breath. Gastrointestinal: No abdominal pain.  No nausea, no vomiting.  No diarrhea.  No constipation. Genitourinary: Negative for dysuria. Musculoskeletal: Negative for neck pain.  Negative for back pain. Neurological: Negative for headaches, focal weakness or numbness.   ____________________________________________   PHYSICAL EXAM:  VITAL SIGNS: ED Triage Vitals  Enc Vitals Group     BP 12/18/16 2238 103/68     Pulse Rate 12/18/16 2238 75     Resp 12/18/16 2238 17     Temp 12/18/16 2238 97.7 F (36.5 C)     Temp Source 12/18/16 2238 Oral     SpO2 12/18/16 2238 95 %     Weight 12/18/16 2236 65.8 kg (145 lb)     Height 12/18/16 2236 1.702 m (5\' 7" )     Head Circumference --      Peak Flow --      Pain Score --      Pain  Loc --      Pain Edu? --      Excl. in [place]? --     Constitutional: Alert and oriented. Well appearing and in no acute distress. Eyes: Conjunctivae are normal.  Head: Atraumatic. Nose: No congestion/rhinnorhea. Mouth/Throat: Mucous membranes are moist. Neck: No stridor.  No meningeal signs.   Cardiovascular: Normal rate, regular rhythm. Good peripheral circulation. Grossly normal heart sounds.  Pacemaker in place. Respiratory: Normal respiratory effort.  No retractions. Lungs CTAB. Gastrointestinal: Soft and nontender. No distention.  Musculoskeletal: Mild chronic peripheral edema. No gross deformities of extremities.  Non-tender to palpation, normal ROM of all major joints, no pelvic tenderness to palpation.  No tenderness to chest wall / rib cage. Neurologic:  Normal speech and language. No gross focal neurologic deficits are appreciated.  Skin:  Skin is warm, dry and intact. No rash noted. Small sub-acute contusion/ecchymosis to right lateral chest wall Psychiatric: Mood and affect are normal. Speech and behavior are normal.  ____________________________________________   LABS (all labs ordered are listed, but only abnormal results are displayed)  Labs Reviewed  CBC WITH DIFFERENTIAL/PLATELET - Abnormal; Notable for the following:       Result Value   RBC 3.74 (*)    RDW 17.5 (*)    Platelets 141 (*)    All other components within normal limits  COMPREHENSIVE METABOLIC PANEL - Abnormal; Notable for the following:    Chloride 99 (*)    Glucose, Bld 145 (*)    Creatinine, Ser 1.27 (*)    Total Protein 6.2 (*)    AST 102 (*)    GFR calc non Af Amer 39 (*)    GFR calc Af Amer 46 (*)    All other components within normal limits  URINALYSIS, ROUTINE W REFLEX MICROSCOPIC - Abnormal; Notable for the following:    Color, Urine YELLOW (*)    APPearance CLEAR (*)    Hgb urine dipstick LARGE (*)    Protein, ur 30 (*)    Leukocytes, UA TRACE (*)    Bacteria, UA RARE (*)    Squamous  Epithelial / LPF 0-5 (*)    All other components within normal limits  URINE CULTURE  TROPONIN I   ____________________________________________  EKG  ED ECG REPORT I, Radiah Lubinski, the attending physician, personally viewed and interpreted  this ECG.  Date: 12/18/2016 EKG Time: 22:40 Rate: 72 Rhythm: normal sinus rhythm QRS Axis: normal Intervals: normal ST/T Wave abnormalities: Non-specific ST segment / T-wave changes, but no evidence of acute ischemia. Narrative Interpretation: no evidence of acute ischemia   ____________________________________________  RADIOLOGY   Dg Shoulder Right  Result Date: 12/19/2016 CLINICAL DATA:  Right shoulder pain after fall. EXAM: RIGHT SHOULDER - 2+ VIEW COMPARISON:  None. FINDINGS: There is no evidence of fracture or dislocation. Mild degenerative changes seen involving the right acromioclavicular joint. Soft tissues are unremarkable. 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 No acute abnormality seen in the right shoulder. Electronically Signed   By: Marijo Conception, M.D.   On: 12/19/2016 07:20    ____________________________________________   PROCEDURES  Critical Care performed: No   Procedure(s) performed:   Procedures   ____________________________________________   INITIAL IMPRESSION / ASSESSMENT AND PLAN / ED COURSE  Pertinent labs & imaging results that were available during my care of the patient were reviewed by me and considered in my medical decision making (see chart for details).   Diff dx includes cardiac event, dehydration/volume depletion, infection, CVA.  I reviewed the electronic medical record and had a more extensive discussion with the patient.  She reports that she has been having similar symptoms for months had multiple recent falls for which she has seen her primary care doctor as well as been seen in the emergency department.  She is scared about her symptoms but  does not seem to have any acute change today.  We observed her overnight in the emergency department and in fact has been here a total of almost 9-1/2 hours.  She has been eating and drinking and has had no worsening or new symptoms.  I discussed the results with the patient and she understands that outpatient follow-up is appropriate.  I provided reassurance and we even obtained radiographs of her right shoulder after her fall even though she has normal range of motion. her lab results are reassuring.  Although she has a few leukocytes and rare bacteria seen and her urinalysis I do not feel this represents an acute UTI and I feel that starting her on antibiotics is not indicated.  I ordered a urine culture to make sure but at this point I believe I would be over treating him potentially leading to diarrhea and other complications.  Her orthostatics were normal and she has not had any chest pain or shortness of breath.  We will try to see if she can ambulate without assistance and if so she will go home to follow up as an outpatient.  She agrees with this plan.  Clinical Course as of Dec 20 802  [facility] Dec 19, 2016  0755 the patient ambulated without difficulty.  She states that she feels like her legs are stronger and she told the nurse that she thinks she is just afraid she is going to fall.  As documented above, I do not feel that I have any criteria by which I can admit her to the hospital.  The symptoms and been going on for months and she has a primary care doctor with whom she can follow-up.  I reiterated this to her and gave my usual and customary return precautions.   [CF]    Clinical Course User Index [CF] Hinda Kehr, MD    ____________________________________________  FINAL CLINICAL IMPRESSION(S) / ED DIAGNOSES  Final diagnoses:  Near syncope  Fall, initial encounter  Strain of right shoulder, initial encounter     MEDICATIONS GIVEN DURING THIS VISIT:  Medications - No data to  display   NEW OUTPATIENT MEDICATIONS STARTED DURING THIS VISIT:  New Prescriptions   No medications on file    Modified Medications   No medications on file    Discontinued Medications   CARVEDILOL (COREG) 6.25 MG TABLET    carvedilol 6.25 mg tablet  bid     Note:  This document was prepared using Dragon voice recognition software and may include unintentional dictation errors.    Hinda Kehr, MD 12/19/16 [phone]

## 2016-12-18 NOTE — ED Triage Notes (Signed)
Pt presents to ED from home via EMS c/o fall and low BP. Pt got up and tried to get to the door, "my legs got real week and my legs got to shaking". EMS gave 500 ml NS

## 2016-12-19 ENCOUNTER — Emergency Department: Payer: Medicare Other

## 2016-12-19 DIAGNOSIS — R55 Syncope and collapse: Secondary | ICD-10-CM | POA: Diagnosis not present

## 2016-12-19 LAB — URINALYSIS, ROUTINE W REFLEX MICROSCOPIC
Bilirubin Urine: NEGATIVE
Glucose, UA: NEGATIVE mg/dL
Ketones, ur: NEGATIVE mg/dL
Nitrite: NEGATIVE
Protein, ur: 30 mg/dL — AB
Specific Gravity, Urine: 1.008 (ref 1.005–1.030)
pH: 5 (ref 5.0–8.0)

## 2016-12-19 LAB — COMPREHENSIVE METABOLIC PANEL
ALT: 33 U/L (ref 14–54)
AST: 102 U/L — ABNORMAL HIGH (ref 15–41)
Albumin: 3.6 g/dL (ref 3.5–5.0)
Alkaline Phosphatase: 72 U/L (ref 38–126)
Anion gap: 10 (ref 5–15)
BUN: 15 mg/dL (ref 6–20)
CO2: 31 mmol/L (ref 22–32)
Calcium: 9 mg/dL (ref 8.9–10.3)
Chloride: 99 mmol/L — ABNORMAL LOW (ref 101–111)
Creatinine, Ser: 1.27 mg/dL — ABNORMAL HIGH (ref 0.44–1.00)
GFR calc Af Amer: 46 mL/min — ABNORMAL LOW (ref >60)
GFR calc non Af Amer: 39 mL/min — ABNORMAL LOW (ref >60)
Glucose, Bld: 145 mg/dL — ABNORMAL HIGH (ref 65–99)
Potassium: 4.7 mmol/L (ref 3.5–5.1)
Sodium: 140 mmol/L (ref 135–145)
Total Bilirubin: 0.6 mg/dL (ref 0.3–1.2)
Total Protein: 6.2 g/dL — ABNORMAL LOW (ref 6.5–8.1)

## 2016-12-19 LAB — CBC WITH DIFFERENTIAL/PLATELET
Basophils Absolute: 0 10*3/uL (ref 0–0.1)
Basophils Relative: 0 %
Eosinophils Absolute: 0.1 10*3/uL (ref 0–0.7)
Eosinophils Relative: 1 %
HCT: 36 % (ref 35.0–47.0)
Hemoglobin: 12.2 g/dL (ref 12.0–16.0)
Lymphocytes Relative: 28 %
Lymphs Abs: 1.7 10*3/uL (ref 1.0–3.6)
MCH: 32.7 pg (ref 26.0–34.0)
MCHC: 34 g/dL (ref 32.0–36.0)
MCV: 96.2 fL (ref 80.0–100.0)
Monocytes Absolute: 0.4 10*3/uL (ref 0.2–0.9)
Monocytes Relative: 7 %
Neutro Abs: 3.8 10*3/uL (ref 1.4–6.5)
Neutrophils Relative %: 64 %
Platelets: 141 10*3/uL — ABNORMAL LOW (ref 150–440)
RBC: 3.74 MIL/uL — ABNORMAL LOW (ref 3.80–5.20)
RDW: 17.5 % — ABNORMAL HIGH (ref 11.5–14.5)
WBC: 6 10*3/uL (ref 3.6–11.0)

## 2016-12-19 LAB — TROPONIN I: Troponin I: <0.03 ng/mL (ref <0.03)

## 2016-12-19 NOTE — Discharge Instructions (Signed)
You have been seen in the Emergency Department (ED) today for a fall.  Your work up does not show any concerning injuries.  Please take over-the-counter ibuprofen and/or Tylenol as needed for your pain (unless you have an allergy or your doctor as told you not to take them), or take any prescribed medication as instructed.  Be sure and drink plenty of clear fluids.  Please follow up with your doctor regarding today's Emergency Department (ED) visit and your recent fall, both your primary care provider and your cardiologist.  Return to the ED if you have any headache, confusion, slurred speech, weakness/numbness of any arm or leg, or any increased pain.

## 2016-12-19 NOTE — ED Notes (Signed)
Pt alert and oriented X4, active, cooperative, pt in NAD. RR even and unlabored, color WNL.  Pt informed to return if any life threatening symptoms occur.  Discharge and followup instructions reviewed. Pt left with friend. Wheeled to car, stands and gets into car with ease.  Left with phone and charger, no other belongings.

## 2016-12-19 NOTE — ED Notes (Addendum)
Pt ambulates independently with walker; states that she has walker at home that she uses. Pt ambulates safely and without incident. Pt states her friend will come pick her up for disposition of discharge. Pt complaining of pain to right hand at site of PIV; PIV removed. Pt assisted to dress. Pt reports that her legs feel "stronger" when ambulating.

## 2016-12-20 LAB — URINE CULTURE
Culture: NO GROWTH
Special Requests: NORMAL

## 2016-12-23 ENCOUNTER — Observation Stay
Admission: EM | Admit: 2016-12-23 | Discharge: 2016-12-26 | Disposition: A | Discharge status: Home / Self Care | Payer: Medicare Other | Attending: Internal Medicine | Admitting: Internal Medicine

## 2016-12-23 ENCOUNTER — Emergency Department: Payer: Medicare Other

## 2016-12-23 ENCOUNTER — Encounter: Payer: Self-pay | Admitting: Emergency Medicine

## 2016-12-23 DIAGNOSIS — G894 Chronic pain syndrome: Secondary | ICD-10-CM | POA: Insufficient documentation

## 2016-12-23 DIAGNOSIS — E039 Hypothyroidism, unspecified: Secondary | ICD-10-CM | POA: Insufficient documentation

## 2016-12-23 DIAGNOSIS — Z8673 Personal history of transient ischemic attack (TIA), and cerebral infarction without residual deficits: Secondary | ICD-10-CM | POA: Insufficient documentation

## 2016-12-23 DIAGNOSIS — Z955 Presence of coronary angioplasty implant and graft: Secondary | ICD-10-CM | POA: Diagnosis not present

## 2016-12-23 DIAGNOSIS — I252 Old myocardial infarction: Secondary | ICD-10-CM | POA: Insufficient documentation

## 2016-12-23 DIAGNOSIS — M79604 Pain in right leg: Secondary | ICD-10-CM

## 2016-12-23 DIAGNOSIS — M549 Dorsalgia, unspecified: Secondary | ICD-10-CM | POA: Insufficient documentation

## 2016-12-23 DIAGNOSIS — Z7951 Long term (current) use of inhaled steroids: Secondary | ICD-10-CM | POA: Insufficient documentation

## 2016-12-23 DIAGNOSIS — R600 Localized edema: Secondary | ICD-10-CM | POA: Insufficient documentation

## 2016-12-23 DIAGNOSIS — Z79899 Other long term (current) drug therapy: Secondary | ICD-10-CM | POA: Insufficient documentation

## 2016-12-23 DIAGNOSIS — E876 Hypokalemia: Principal | ICD-10-CM | POA: Insufficient documentation

## 2016-12-23 DIAGNOSIS — R2689 Other abnormalities of gait and mobility: Secondary | ICD-10-CM

## 2016-12-23 DIAGNOSIS — W19XXXA Unspecified fall, initial encounter: Secondary | ICD-10-CM | POA: Diagnosis not present

## 2016-12-23 DIAGNOSIS — M79605 Pain in left leg: Secondary | ICD-10-CM

## 2016-12-23 DIAGNOSIS — Z951 Presence of aortocoronary bypass graft: Secondary | ICD-10-CM | POA: Insufficient documentation

## 2016-12-23 DIAGNOSIS — R531 Weakness: Secondary | ICD-10-CM | POA: Diagnosis present

## 2016-12-23 DIAGNOSIS — G629 Polyneuropathy, unspecified: Secondary | ICD-10-CM | POA: Insufficient documentation

## 2016-12-23 DIAGNOSIS — Z7982 Long term (current) use of aspirin: Secondary | ICD-10-CM | POA: Diagnosis not present

## 2016-12-23 DIAGNOSIS — M48061 Spinal stenosis, lumbar region without neurogenic claudication: Secondary | ICD-10-CM | POA: Insufficient documentation

## 2016-12-23 DIAGNOSIS — F329 Major depressive disorder, single episode, unspecified: Secondary | ICD-10-CM | POA: Diagnosis not present

## 2016-12-23 DIAGNOSIS — G20 Parkinson's disease: Secondary | ICD-10-CM | POA: Insufficient documentation

## 2016-12-23 DIAGNOSIS — I2510 Atherosclerotic heart disease of native coronary artery without angina pectoris: Secondary | ICD-10-CM | POA: Insufficient documentation

## 2016-12-23 DIAGNOSIS — I10 Essential (primary) hypertension: Secondary | ICD-10-CM | POA: Diagnosis not present

## 2016-12-23 DIAGNOSIS — Z950 Presence of cardiac pacemaker: Secondary | ICD-10-CM | POA: Diagnosis not present

## 2016-12-23 HISTORY — DX: Atherosclerotic heart disease of native coronary artery without angina pectoris: I25.10

## 2016-12-23 LAB — CBC WITH DIFFERENTIAL/PLATELET
Basophils Absolute: 0 10*3/uL (ref 0–0.1)
Basophils Relative: 0 %
Eosinophils Absolute: 0.1 10*3/uL (ref 0–0.7)
Eosinophils Relative: 1 %
HCT: 34.7 % — ABNORMAL LOW (ref 35.0–47.0)
Hemoglobin: 11.8 g/dL — ABNORMAL LOW (ref 12.0–16.0)
Lymphocytes Relative: 24 %
Lymphs Abs: 1.4 10*3/uL (ref 1.0–3.6)
MCH: 32.2 pg (ref 26.0–34.0)
MCHC: 33.9 g/dL (ref 32.0–36.0)
MCV: 94.8 fL (ref 80.0–100.0)
Monocytes Absolute: 0.5 10*3/uL (ref 0.2–0.9)
Monocytes Relative: 8 %
Neutro Abs: 3.9 10*3/uL (ref 1.4–6.5)
Neutrophils Relative %: 67 %
Platelets: 153 10*3/uL (ref 150–440)
RBC: 3.66 MIL/uL — ABNORMAL LOW (ref 3.80–5.20)
RDW: 17.1 % — ABNORMAL HIGH (ref 11.5–14.5)
WBC: 5.9 10*3/uL (ref 3.6–11.0)

## 2016-12-23 LAB — COMPREHENSIVE METABOLIC PANEL
ALT: 78 U/L — ABNORMAL HIGH (ref 14–54)
AST: 110 U/L — ABNORMAL HIGH (ref 15–41)
Albumin: 3.7 g/dL (ref 3.5–5.0)
Alkaline Phosphatase: 71 U/L (ref 38–126)
Anion gap: 9 (ref 5–15)
BUN: 10 mg/dL (ref 6–20)
CO2: 32 mmol/L (ref 22–32)
Calcium: 9 mg/dL (ref 8.9–10.3)
Chloride: 98 mmol/L — ABNORMAL LOW (ref 101–111)
Creatinine, Ser: 1.15 mg/dL — ABNORMAL HIGH (ref 0.44–1.00)
GFR calc Af Amer: 51 mL/min — ABNORMAL LOW (ref >60)
GFR calc non Af Amer: 44 mL/min — ABNORMAL LOW (ref >60)
Glucose, Bld: 188 mg/dL — ABNORMAL HIGH (ref 65–99)
Potassium: 2.8 mmol/L — ABNORMAL LOW (ref 3.5–5.1)
Sodium: 139 mmol/L (ref 135–145)
Total Bilirubin: 0.5 mg/dL (ref 0.3–1.2)
Total Protein: 6.5 g/dL (ref 6.5–8.1)

## 2016-12-23 LAB — TROPONIN I: Troponin I: 0.03 ng/mL (ref <0.03)

## 2016-12-23 MED ORDER — POTASSIUM CHLORIDE CRYS ER 20 MEQ PO TBCR
40.0000 meq | EXTENDED_RELEASE_TABLET | Freq: Once | ORAL | Status: AC
  Administered 2016-12-24: 40 meq via ORAL
  Filled 2016-12-23: qty 2

## 2016-12-23 NOTE — ED Triage Notes (Addendum)
Patient brought in by ems from home. Patient fell times 2 yesterday and fell times one today. Patient with complaint of bilateral leg pain from the hips down. Patient also states that she has increased swelling to bilateral legs. Patient was also treated for a UTI about 2 weeks ago.

## 2016-12-23 NOTE — ED Provider Notes (Signed)
[facility] Emergency Department Provider Note       Time seen: ----------------------------------------- 9:34 PM on 12/23/2016 -----------------------------------------     I have reviewed the triage vital signs and the nursing notes.   HISTORY   Chief Complaint Fall and Leg Pain    HPI Kristi [name] is a 76 y.o. female who presents to the ED for frequent falls and weakness. Patient states "her legs are not working. She is also complaining of diffuse pain from the waist down both legs. She also had increased swelling was recently treated for UTI. She denies any recent other illness such as fevers, chills, chest pain, shortness of breath, vomiting or diarrhea. Pain is 8 out of 10 in both legs.  Past Medical History:  Diagnosis Date  . Anxiety   . Broken arm    left FA 3/17  . Broken arm    left  . Depression   . Hyperlipidemia   . Hypertension   . Hypothyroid   . Myocardial infarction ([place])   . Neck pain 06/04/2014  . Spinal stenosis     Patient Active Problem List   Diagnosis Date Noted  . Anxiety 12/07/2016  . 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 Cerebrovascular accident ([place]) 12/07/2016  . Chronic depression 12/07/2016  . Essential hypertension 12/07/2016  . Hypothyroidism 12/07/2016  . Mixed hyperlipidemia 12/07/2016  . Myocardial infarction ([place]) 12/07/2016  . TIA (transient ischemic attack) 11/17/2016  . Parkinson disease ([place]) 10/27/2016  . Tremor 10/27/2016  . Chronic knee pain (B) (R>L) 10/19/2016  . Chest pain 09/29/2016  . Abnormal CT scan, lumbar spine 09/08/2016  . Lumbar foraminal stenosis (multilevel) 09/08/2016  . Fracture of clavicle 08/03/2016  . Polyneuropathy 05/17/2016  . Neurogenic pain 04/12/2016  . Chronic lower extremity pain (Bilateral) (R>L) 04/12/2016  . Chronic lower extremity radicular pain (Primary Source of Pain) (Bilateral) (R>L) 04/12/2016  . Lower extremity weakness 04/12/2016  . Chronic low  back pain (Secondary source of pain) (Bilateral) (R>L) 04/12/2016  . Chronic wrist pain (Left) (secondary to fracture) 04/12/2016  . Lumbar spondylosis 04/12/2016  . Chronic pain syndrome 04/11/2016  . Long term current use of opiate analgesic 04/11/2016  . Long term prescription opiate use 04/11/2016  . Opiate use 04/11/2016  . Long term prescription benzodiazepine use 04/11/2016  . Lumbar radiculopathy (Multilevel) (Bilateral) 05/07/2015  . Lumbar facet syndrome (Bilateral) (R>L) 08/21/2014  . 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 Sacroiliac joint dysfunction (Bilateral) 08/13/2014  . Frequent falls 06/04/2014  . Memory loss 06/04/2014  . Anxiety and depression 08/22/2013  . CAD (coronary artery disease) 08/22/2013  . Therapeutic opioid-induced constipation (OIC) 08/22/2013  . GERD (gastroesophageal reflux disease) 08/22/2013    Past Surgical History:  Procedure Laterality Date  . ABDOMINAL HYSTERECTOMY    . CORONARY ARTERY BYPASS GRAFT    . CORONARY STENT PLACEMENT    . s/p pacer insertion      Allergies Penicillin g  Social History Social History  Substance Use Topics  . Smoking status: Never Smoker  . Smokeless tobacco: Never Used  . Alcohol use No    Review of Systems Constitutional: Negative for fever. Eyes: Negative for vision changes ENT:  Negative for congestion, sore throat Cardiovascular: Negative for chest pain. Respiratory: Negative for shortness of breath. Gastrointestinal: Negative for abdominal pain, vomiting and diarrhea. Genitourinary: Negative for dysuria. Musculoskeletal: positive for leg pain, swelling Skin: Negative for rash. Neurological:positive for weakness  All systems negative/normal/unremarkable except  as stated in the HPI  ____________________________________________   PHYSICAL EXAM:  VITAL SIGNS: ED Triage Vitals [12/23/16 2125]  Enc Vitals Group     BP      Pulse      Resp      Temp       Temp src      SpO2      Weight 145 lb (65.8 kg)     Height 5\' 7"  (1.702 m)     Head Circumference      Peak Flow      Pain Score 8     Pain Loc      Pain Edu?      Excl. in [place]?    Constitutional: Alert and oriented. Well appearing and in no distress. Eyes: Conjunctivae are normal. Normal extraocular movements. ENT   Head: Normocephalic and atraumatic.   Nose: No congestion/rhinnorhea.   Mouth/Throat: Mucous membranes are moist.   Neck: No stridor. Cardiovascular: Normal rate, regular rhythm. No murmurs, rubs, or gallops. Respiratory: Normal respiratory effort without tachypnea nor retractions. Breath sounds are clear and equal bilaterally. No wheezes/rales/rhonchi. Gastrointestinal: Soft and nontender. Normal bowel sounds Musculoskeletal: Nontender with normal range of motion in extremities. bilateral mild edema is noted. Neurologic:  Normal speech and language. No gross focal neurologic deficits are appreciated. generalized weakness, nothing focal Skin:  Skin is warm, dry and intact. No rash noted. Psychiatric: Mood and affect are normal. Speech and behavior are normal.  ____________________________________________  EKG: Interpreted by me.junctional rhythm with a rate of 60 bpm, LVH, long QT, left axis deviation  ____________________________________________  ED COURSE:  Pertinent labs & imaging results that were available during my care of the patient were reviewed by me and considered in my medical decision making (see chart for details). Patient presents for weakness and frequent falls, we will assess with labs and imaging as indicated.   Procedures ____________________________________________   LABS (pertinent positives/negatives)  Labs Reviewed  CBC WITH DIFFERENTIAL/PLATELET - Abnormal; Notable for the following:       Result Value   RBC 3.66 (*)    Hemoglobin 11.8 (*)    HCT 34.7 (*)    RDW 17.1 (*)    All other components within normal limits   COMPREHENSIVE METABOLIC PANEL - Abnormal; Notable for the following:    Potassium 2.8 (*)    Chloride 98 (*)    Glucose, Bld 188 (*)    Creatinine, Ser 1.15 (*)    AST 110 (*)    ALT 78 (*)    GFR calc non Af Amer 44 (*)    GFR calc Af Amer 51 (*)    All other components within normal limits  TROPONIN I - Abnormal; Notable for the following:    Troponin I 0.03 (*)    All other components within normal limits  URINALYSIS, COMPLETE (UACMP) WITH MICROSCOPIC    RADIOLOGY  CT head, pelvis x-rays are negative for acute process ____________________________________________  DIFFERENTIAL DIAGNOSIS   dehydration, electrolyte abnormality, MI, CVA, medication side effect, orthostatic hypotension, hip fracture, occult infection  FINAL ASSESSMENT AND PLAN  fall, weakness   Plan: Patient's labs and imaging were dictated above. Patient had presented for recent falls and weakness. Final disposition is pending at this time.   Earleen Newport, MD   Note: This note was generated in part or whole with voice recognition software. Voice recognition is usually quite accurate but there are transcription errors that can and very often do occur. I  apologize for any typographical errors that were not detected and corrected.     Earleen Newport, MD 12/23/16 [phone]

## 2016-12-24 ENCOUNTER — Encounter: Payer: Self-pay | Admitting: Internal Medicine

## 2016-12-24 DIAGNOSIS — E876 Hypokalemia: Secondary | ICD-10-CM | POA: Diagnosis not present

## 2016-12-24 LAB — MAGNESIUM: Magnesium: 1.9 mg/dL (ref 1.7–2.4)

## 2016-12-24 LAB — TSH: TSH: 2.453 u[IU]/mL (ref 0.350–4.500)

## 2016-12-24 LAB — TROPONIN I: Troponin I: <0.03 ng/mL (ref <0.03)

## 2016-12-24 LAB — POTASSIUM: Potassium: 3.4 mmol/L — ABNORMAL LOW (ref 3.5–5.1)

## 2016-12-24 MED ORDER — ACETAMINOPHEN 325 MG PO TABS
650.0000 mg | ORAL_TABLET | Freq: Once | ORAL | Status: AC
  Administered 2016-12-24: 650 mg via ORAL
  Filled 2016-12-24: qty 2

## 2016-12-24 MED ORDER — CARVEDILOL 12.5 MG PO TABS
6.2500 mg | ORAL_TABLET | Freq: Two times a day (BID) | ORAL | Status: DC
  Administered 2016-12-24 – 2016-12-25 (×4): 6.25 mg via ORAL
  Filled 2016-12-24 (×4): qty 1

## 2016-12-24 MED ORDER — ACETAMINOPHEN 650 MG RE SUPP: 650.0000 mg | Freq: Four times a day (QID) | RECTAL | Status: DC | PRN

## 2016-12-24 MED ORDER — LORATADINE 10 MG PO TABS
10.0000 mg | ORAL_TABLET | Freq: Every day | ORAL | Status: DC
  Administered 2016-12-24 – 2016-12-26 (×3): 10 mg via ORAL
  Filled 2016-12-24 (×3): qty 1

## 2016-12-24 MED ORDER — GABAPENTIN 300 MG PO CAPS
300.0000 mg | ORAL_CAPSULE | Freq: Three times a day (TID) | ORAL | Status: DC
Start: 2016-12-24 — End: 2016-12-26
  Administered 2016-12-24 – 2016-12-26 (×7): 300 mg via ORAL
  Filled 2016-12-24 (×7): qty 1

## 2016-12-24 MED ORDER — SERTRALINE HCL 50 MG PO TABS
50.0000 mg | ORAL_TABLET | Freq: Every day | ORAL | Status: DC
  Administered 2016-12-24 – 2016-12-25 (×2): 50 mg via ORAL
  Filled 2016-12-24 (×2): qty 1

## 2016-12-24 MED ORDER — POTASSIUM CHLORIDE CRYS ER 20 MEQ PO TBCR
20.0000 meq | EXTENDED_RELEASE_TABLET | Freq: Two times a day (BID) | ORAL | Status: DC
  Administered 2016-12-24: 20 meq via ORAL
  Filled 2016-12-24: qty 1

## 2016-12-24 MED ORDER — POTASSIUM CHLORIDE IN NACL 20-0.9 MEQ/L-% IV SOLN
INTRAVENOUS | Status: DC
  Administered 2016-12-24: 17:00:00 via INTRAVENOUS
  Filled 2016-12-24 (×2): qty 1000

## 2016-12-24 MED ORDER — LEVOTHYROXINE SODIUM 50 MCG PO TABS
100.0000 ug | ORAL_TABLET | Freq: Every day | ORAL | Status: DC
  Administered 2016-12-24 – 2016-12-26 (×3): 100 ug via ORAL
  Filled 2016-12-24 (×3): qty 2

## 2016-12-24 MED ORDER — ASPIRIN EC 81 MG PO TBEC
81.0000 mg | DELAYED_RELEASE_TABLET | Freq: Every day | ORAL | Status: DC
  Administered 2016-12-24 – 2016-12-26 (×3): 81 mg via ORAL
  Filled 2016-12-24 (×3): qty 1

## 2016-12-24 MED ORDER — TRAZODONE HCL 50 MG PO TABS: 50.0000 mg | ORAL_TABLET | Freq: Every evening | ORAL | Status: DC | PRN

## 2016-12-24 MED ORDER — DOCUSATE SODIUM 100 MG PO CAPS
100.0000 mg | ORAL_CAPSULE | Freq: Two times a day (BID) | ORAL | Status: DC
  Administered 2016-12-24 – 2016-12-25 (×4): 100 mg via ORAL
  Filled 2016-12-24 (×5): qty 1

## 2016-12-24 MED ORDER — SODIUM CHLORIDE 0.9 % IV SOLN
INTRAVENOUS | Status: DC
  Administered 2016-12-24: 07:00:00 via INTRAVENOUS

## 2016-12-24 MED ORDER — CLONAZEPAM 0.5 MG PO TABS
0.5000 mg | ORAL_TABLET | Freq: Two times a day (BID) | ORAL | Status: DC
  Administered 2016-12-24 – 2016-12-26 (×5): 0.5 mg via ORAL
  Filled 2016-12-24 (×5): qty 1

## 2016-12-24 MED ORDER — VITAMIN B-12 1000 MCG PO TABS
500.0000 ug | ORAL_TABLET | Freq: Every day | ORAL | Status: DC
Start: 2016-12-24 — End: 2016-12-26
  Administered 2016-12-24 – 2016-12-26 (×3): 500 ug via ORAL
  Filled 2016-12-24 (×3): qty 1

## 2016-12-24 MED ORDER — NITROGLYCERIN 0.4 MG SL SUBL: 0.4000 mg | SUBLINGUAL_TABLET | SUBLINGUAL | Status: DC | PRN

## 2016-12-24 MED ORDER — VITAMIN D 1000 UNITS PO TABS
1000.0000 [IU] | ORAL_TABLET | Freq: Every day | ORAL | Status: DC
  Administered 2016-12-24 – 2016-12-26 (×3): 1000 [IU] via ORAL
  Filled 2016-12-24 (×3): qty 1

## 2016-12-24 MED ORDER — LINACLOTIDE 145 MCG PO CAPS
145.0000 ug | ORAL_CAPSULE | Freq: Every day | ORAL | Status: DC
  Administered 2016-12-24 – 2016-12-25 (×2): 145 ug via ORAL
  Filled 2016-12-24 (×3): qty 1

## 2016-12-24 MED ORDER — SERTRALINE HCL 50 MG PO TABS: 50.0000 mg | ORAL_TABLET | Freq: Two times a day (BID) | ORAL | Status: DC

## 2016-12-24 MED ORDER — ONDANSETRON HCL 4 MG/2ML IJ SOLN: 4.0000 mg | Freq: Four times a day (QID) | INTRAMUSCULAR | Status: DC | PRN

## 2016-12-24 MED ORDER — OLOPATADINE HCL 0.1 % OP SOLN
1.0000 [drp] | Freq: Two times a day (BID) | OPHTHALMIC | Status: DC
  Administered 2016-12-24 – 2016-12-26 (×5): 1 [drp] via OPHTHALMIC
  Filled 2016-12-24: qty 5

## 2016-12-24 MED ORDER — LAMOTRIGINE 100 MG PO TABS
100.0000 mg | ORAL_TABLET | Freq: Three times a day (TID) | ORAL | Status: DC
  Administered 2016-12-24 – 2016-12-26 (×7): 100 mg via ORAL
  Filled 2016-12-24 (×3): qty 1
  Filled 2016-12-24: qty 4
  Filled 2016-12-24 (×2): qty 1
  Filled 2016-12-24 (×2): qty 4
  Filled 2016-12-24 (×3): qty 1
  Filled 2016-12-24 (×2): qty 4
  Filled 2016-12-24: qty 1
  Filled 2016-12-24 (×2): qty 4

## 2016-12-24 MED ORDER — SERTRALINE HCL 50 MG PO TABS
100.0000 mg | ORAL_TABLET | Freq: Every day | ORAL | Status: DC
  Administered 2016-12-24 – 2016-12-26 (×3): 100 mg via ORAL
  Filled 2016-12-24 (×3): qty 2

## 2016-12-24 MED ORDER — THIOTHIXENE 5 MG PO CAPS
5.0000 mg | ORAL_CAPSULE | Freq: Two times a day (BID) | ORAL | Status: DC
  Administered 2016-12-24 – 2016-12-26 (×5): 5 mg via ORAL
  Filled 2016-12-24 (×6): qty 1

## 2016-12-24 MED ORDER — POTASSIUM CHLORIDE 10 MEQ/100ML IV SOLN
10.0000 meq | INTRAVENOUS | Status: AC
  Administered 2016-12-24: 10 meq via INTRAVENOUS
  Filled 2016-12-24 (×2): qty 100

## 2016-12-24 MED ORDER — TRAMADOL-ACETAMINOPHEN 37.5-325 MG PO TABS: 1.0000 | ORAL_TABLET | Freq: Four times a day (QID) | ORAL | Status: DC | PRN

## 2016-12-24 MED ORDER — HEPARIN SODIUM (PORCINE) 5000 UNIT/ML IJ SOLN
5000.0000 [IU] | Freq: Three times a day (TID) | INTRAMUSCULAR | Status: DC
  Administered 2016-12-24 – 2016-12-26 (×6): 5000 [IU] via SUBCUTANEOUS
  Filled 2016-12-24 (×6): qty 1

## 2016-12-24 MED ORDER — PANTOPRAZOLE SODIUM 40 MG PO TBEC
40.0000 mg | DELAYED_RELEASE_TABLET | Freq: Every day | ORAL | Status: DC
  Administered 2016-12-24 – 2016-12-26 (×3): 40 mg via ORAL
  Filled 2016-12-24 (×3): qty 1

## 2016-12-24 MED ORDER — POTASSIUM CHLORIDE 10 MEQ/100ML IV SOLN
10.0000 meq | Freq: Once | INTRAVENOUS | Status: AC
  Administered 2016-12-24: 10 meq via INTRAVENOUS

## 2016-12-24 MED ORDER — FLUTICASONE PROPIONATE 50 MCG/ACT NA SUSP
2.0000 | Freq: Every day | NASAL | Status: DC
  Administered 2016-12-24 – 2016-12-26 (×3): 2 via NASAL
  Filled 2016-12-24: qty 16

## 2016-12-24 MED ORDER — ROSUVASTATIN CALCIUM 10 MG PO TABS
20.0000 mg | ORAL_TABLET | Freq: Every day | ORAL | Status: DC
  Administered 2016-12-24 – 2016-12-26 (×3): 20 mg via ORAL
  Filled 2016-12-24 (×3): qty 2

## 2016-12-24 MED ORDER — FUROSEMIDE 40 MG PO TABS
40.0000 mg | ORAL_TABLET | Freq: Two times a day (BID) | ORAL | Status: DC
  Administered 2016-12-24: 40 mg via ORAL
  Filled 2016-12-24: qty 1

## 2016-12-24 MED ORDER — ACETAMINOPHEN 325 MG PO TABS
650.0000 mg | ORAL_TABLET | Freq: Four times a day (QID) | ORAL | Status: DC | PRN
  Administered 2016-12-26: 650 mg via ORAL
  Filled 2016-12-24: qty 2

## 2016-12-24 MED ORDER — ONDANSETRON HCL 4 MG PO TABS: 4.0000 mg | ORAL_TABLET | Freq: Four times a day (QID) | ORAL | Status: DC | PRN

## 2016-12-24 NOTE — ED Notes (Signed)
Patient is asleep on the stretcher. Rise and fall of the chest noted. Patient is easily aroused when spoken to. Patient does not appear to be in acute distress at this time.   

## 2016-12-24 NOTE — Progress Notes (Signed)
OT Cancellation Note  Patient Details Name: HARLO JASO MRN: [id number] DOB: 06/12/1940   Cancelled Treatment:    Reason Eval/Treat Not Completed: Other (comment). Order received, chart reviewed. Pt working with PT. Will re-attempt OT evaluation at later date/time as pt is available and as schedule permits.  Jeni Salles, MPH, MS, OTR/L ascom [phone] 12/24/16, 10:24 AM

## 2016-12-24 NOTE — Clinical Social Work Note (Signed)
CSW received update that PT has recommended STR. However, the patient is under observation status with no qualifying inpatient admission in the past 30 days; therefore, Medicare will not cover STR and the patient would be required to pay out of pocket. CSW will discuss with the patient when able.  Santiago Bumpers, MSW, Latanya Presser [phone]

## 2016-12-24 NOTE — Clinical Social Work Note (Signed)
CSW received consult that patient may require placement. CSW will follow pending PT recommendation.  Santiago Bumpers, MSW, Latanya Presser [phone]

## 2016-12-24 NOTE — Progress Notes (Signed)
[facility] NAME: Kristi [name]    MR#:  [id number]  DATE OF BIRTH:  1940/08/09  SUBJECTIVE:  CHIEF COMPLAINT:   Chief Complaint  Patient presents with  . Fall  . Leg Pain   - Came in with weakness and bilateral lower extremity cramps. Potassium was low. -Feels much better  REVIEW OF SYSTEMS:  Review of Systems  Constitutional: Positive for malaise/fatigue. Negative for chills and fever.  HENT: Negative for congestion, ear discharge, hearing loss and nosebleeds.   Eyes: Negative for blurred vision and double vision.  Respiratory: Negative for cough, shortness of breath and wheezing.   Cardiovascular: Negative for chest pain, palpitations and leg swelling.  Gastrointestinal: Negative for abdominal pain, constipation, diarrhea, nausea and vomiting.  Genitourinary: Negative for dysuria.  Musculoskeletal: Negative for myalgias.  Neurological: Positive for weakness. Negative for dizziness, speech change, focal weakness, seizures and headaches.  Psychiatric/Behavioral: Negative for depression.    DRUG ALLERGIES:   Allergies  Allergen Reactions  . Penicillin G Swelling and Rash    Has patient had a PCN reaction causing immediate rash, facial/tongue/throat swelling, SOB or lightheadedness with hypotension: Yes Has patient had a PCN reaction causing severe rash involving mucus membranes or skin necrosis: No Has patient had a PCN reaction that required hospitalization: No Has patient had a PCN reaction occurring within the last 10 years: No If all of the above answers are "NO", then may proceed with Cephalosporin use.     VITALS:  Blood pressure 122/60, pulse 61, temperature 97.6 F (36.4 C), temperature source Oral, resp. rate 18, height 5\' 7"  (1.702 m), weight 75.5 kg (166 lb 8 oz), SpO2 96 %.  PHYSICAL EXAMINATION:  Physical Exam  GENERAL:  76 y.o.-year-old patient lying in the bed with no acute distress.  EYES: Pupils  equal, round, reactive to light and accommodation. No scleral icterus. Extraocular muscles intact.  HEENT: Head atraumatic, normocephalic. Oropharynx and nasopharynx clear.  NECK:  Supple, no jugular venous distention. No thyroid enlargement, no tenderness.  LUNGS: Normal breath sounds bilaterally, no wheezing, rales,rhonchi or crepitation. No use of accessory muscles of respiration.  CARDIOVASCULAR: S1, S2 normal. No murmurs, rubs, or gallops.  ABDOMEN: Soft, nontender, nondistended. Bowel sounds present. No organomegaly or mass.  EXTREMITIES: No pedal edema, cyanosis, or clubbing.  NEUROLOGIC: Cranial nerves II through XII are intact. Muscle strength 5/5 in all extremities. Sensation intact. Gait not checked.  PSYCHIATRIC: The patient is alert and oriented x 3.  SKIN: No obvious rash, lesion, or ulcer.    LABORATORY PANEL:   CBC  Recent Labs Lab 12/23/16 2146  WBC 5.9  HGB 11.8*  HCT 34.7*  PLT 153   ------------------------------------------------------------------------------------------------------------------  Chemistries   Recent Labs Lab 12/23/16 2146  NA 139  K 2.8*  CL 98*  CO2 32  GLUCOSE 188*  BUN 10  CREATININE 1.15*  CALCIUM 9.0  MG 1.9  AST 110*  ALT 78*  ALKPHOS 71  BILITOT 0.5   ------------------------------------------------------------------------------------------------------------------  Cardiac Enzymes  Recent Labs Lab 12/24/16 0005  TROPONINI <0.03   ------------------------------------------------------------------------------------------------------------------  RADIOLOGY:  Dg Pelvis 1-2 Views  Result Date: 12/23/2016 CLINICAL DATA:  Patient fell twice yesterday and wants today. Bilateral leg pain. Increased swelling to the legs. EXAM: PELVIS - 1-2 VIEW COMPARISON:  CT lumbar spine 04/29/2016.  Lumbar spine 04/12/2016. FINDINGS: Diffuse bone demineralization. Degenerative changes in the lower lumbar spine and in both hips. The  pelvis appears intact. No evidence  of acute fracture or dislocation. SI joints and symphysis pubis are not displaced. Visualized sacrum appears intact. Vascular calcifications. Intramedullary rod is partially identified in the right femur. IMPRESSION: No acute bony abnormalities. Degenerative changes in the lower lumbar spine and hips. Diffuse bone demineralization. Electronically Signed   By: Lucienne Capers M.D.   On: 12/23/2016 21:51   Ct Head Wo Contrast  Result Date: 12/23/2016 CLINICAL DATA:  [place] twice yesterday and once today, altered level of consciousness, lower extremity swelling, history hypertension, coronary artery disease post MI, PTCA, and CABG EXAM: CT HEAD WITHOUT CONTRAST TECHNIQUE: Contiguous axial images were obtained from the base of the skull through the vertex without intravenous contrast. Sagittal and coronal MPR images reconstructed from axial data set. COMPARISON:  12/04/2016 FINDINGS: Brain: Generalized atrophy. Normal ventricular morphology. No midline shift or mass effect. Small vessel chronic ischemic changes of deep cerebral white matter. No intracranial hemorrhage, mass lesion, evidence of acute infarction, or extra-axial fluid collection. Vascular: Atherosclerotic calcifications throughout internal carotid arteries at skullbase. Skull: Intact Sinuses/Orbits: Visualized paranasal sinuses and LEFT mastoid air cells clear. Partial opacification of RIGHT mastoid air cells, chronic. Other: N/A IMPRESSION: Atrophy with small vessel chronic ischemic changes of deep cerebral white matter. No acute intracranial abnormalities. Electronically Signed   By: Lavonia Dana M.D.   On: 12/23/2016 22:21    EKG:   Orders placed or performed during the hospital encounter of 12/23/16  . EKG 12-Lead  . EKG 12-Lead  . ED EKG  . ED EKG  . EKG 12-Lead  . EKG 12-Lead    ASSESSMENT AND PLAN:   76 year old female with past medical history significant for CAD, hypertension, hyper there is  some, depression presents to hospital secondary to 2 week history of weakness and leg cramps.  #1 extreme weakness and leg cramps-could be from hypokalemia. -Check urine analysis to rule out infection -Physical therapy recommended rehabilitation  #2 hypokalemia-secondary to being on Lasix and not taking oral supplementation for the last few days -Being replaced aggressively. Hold Lasix for now and follow up After magnesium within normal limits  #3 hypertension-continue Coreg  #4 CAD - stable. Continue aspirin and Plavix  #5 depression-continue home medications  #6 DVT prophylaxis-heparin subcutaneous     All the records are reviewed and case discussed with Care Management/Social Workerr. Management plans discussed with the patient, family and they are in agreement.  CODE STATUS: Full Code  TOTAL TIME TAKING CARE OF THIS PATIENT: 37 minutes.   POSSIBLE D/C IN 2 DAYS, DEPENDING ON CLINICAL CONDITION.   Ludean Duhart M.D on 12/24/2016 at 1:30 PM  Between 7am to 6pm - Pager - [phone]  After 6pm go to [email or web address] - password [email or web address] [facility]  Office  [phone]  CC: Primary care physician; Lorelee Market, MD

## 2016-12-24 NOTE — Evaluation (Signed)
Physical Therapy Evaluation Patient Details Name: Kristi [name] MRN: [id number] DOB: 1940/10/30 Today's Date: 12/24/2016   History of Present Illness  Pt is a 76 y.o.femalepresenting with hypokalemia with a known history of hyperlipidemia, hypertension, hypothyroidism, CAD s/p MI.   Clinical Impression  Patient evaluated after multiple falls in the past few weeks. She was evaluated roughly 1 month prior for CVA like symptoms, determined to be TIA. She does seem to have some mild residual facial droop on L side, no tongue deviation, or difficulty with finger nose finger. She reports moderate to severe bilateral knee pain with ambulation and requires mod-max A for all transfers and very limited gait. She reports this has been progressively worsening over the past month. Given that she lives alone, she would benefit from short term rehabilitation to increase her independence with mobility.     Follow Up Recommendations SNF    Equipment Recommendations       Recommendations for Other Services       Precautions / Restrictions Precautions Precautions: Fall Restrictions Weight Bearing Restrictions: No      Mobility  Bed Mobility Overal bed mobility: Needs Assistance Bed Mobility: Supine to Sit     Supine to sit: Max assist     General bed mobility comments: Patient assisted with supine to sit transfer max A secondary to trunkal weakness.   Transfers Overall transfer level: Needs assistance Equipment used: Rolling walker (2 wheeled) Transfers: Sit to/from Stand Sit to Stand: Max assist         General transfer comment: Patient requires multiple attempts to complete secondary to LE weakness  Ambulation/Gait Ambulation/Gait assistance: Mod assist Ambulation Distance (Feet): 3 Feet (Additional gait training noted later) Assistive device: Rolling walker (2 wheeled) Gait Pattern/deviations: Decreased step length - right;Decreased step length - left;Trunk flexed   Gait  velocity interpretation: <1.8 ft/sec, indicative of risk for recurrent falls General Gait Details: Patient requires heavy cuing for technique/sequencing as well as hand placement on RW. Very slow steps with multiple times patient would lift foot place in front of her then return to original position due to fear/weakness.   Stairs            Wheelchair Mobility    Modified Rankin (Stroke Patients Only)       Balance Overall balance assessment: History of Falls;Needs assistance Sitting-balance support: Bilateral upper extremity supported Sitting balance-Leahy Scale: Fair     Standing balance support: Bilateral upper extremity supported Standing balance-Leahy Scale: Poor                               Pertinent Vitals/Pain Pain Assessment: 0-10 Pain Score: 8  Pain Location: BLE, back Pain Descriptors / Indicators: Aching Pain Intervention(s): Limited activity within patient's tolerance;Monitored during session;Repositioned    Home Living Family/patient expects to be discharged to:: Private residence Living Arrangements: Alone Available Help at Discharge: Family;Available PRN/intermittently Type of Home: Mobile home Home Access: Stairs to enter Entrance Stairs-Rails: Right;Left;Can reach both Entrance Stairs-Number of Steps: 4 Home Layout: One level Home Equipment: Walker - 2 wheels;Cane - single point;Toilet riser;Hand held shower head      Prior Function Level of Independence: Needs assistance   Gait / Transfers Assistance Needed: pt reports Ind Amb without AD  ADL's / Homemaking Assistance Needed: pt reports requiring assist from family for bathing/dressing; does not drive, uses [employer] for transportation, reports multiple falls (7 falls in past 5 days), per recent admission,  pt also had/has Meals on Wheels, indep with med mgt        Hand Dominance   Dominant Hand: Right    Extremity/Trunk Assessment   Upper Extremity Assessment Upper Extremity  Assessment:  (Able to complete finger nose finger bilaterally )    Lower Extremity Assessment Lower Extremity Assessment: Generalized weakness (No overt deficits in sensation, negative for clonus)       Communication   Communication: No difficulties  Cognition Arousal/Alertness: Awake/alert Behavior During Therapy: Flat affect Overall Cognitive Status: Within Functional Limits for tasks assessed                                        General Comments      Exercises Other Exercises Other Exercises: Assisted patient with ambulation bout to complete session x 6 feet, with cuing for sequencing, technique and mod A for transfers with RW and min A for ambulation, multiple rest breaks required.    Assessment/Plan    PT Assessment Patient needs continued PT services  PT Problem List Decreased strength;Decreased activity tolerance;Decreased balance;Decreased knowledge of use of DME;Pain;Decreased safety awareness;Decreased mobility       PT Treatment Interventions DME instruction;Therapeutic activities;Therapeutic exercise;Gait training;Stair training;Balance training;Neuromuscular re-education    PT Goals (Current goals can be found in the Care Plan section)  Acute Rehab PT Goals Patient Stated Goal: get stronger PT Goal Formulation: With patient Time For Goal Achievement: 01/07/17 Potential to Achieve Goals: Fair    Frequency Min 2X/week   Barriers to discharge Inaccessible home environment;Decreased caregiver support Patient lives alone     Co-evaluation               AM-PAC PT "6 Clicks" Daily Activity  Outcome Measure Difficulty turning over in bed (including adjusting bedclothes, sheets and blankets)?: A Lot Difficulty moving from lying on back to sitting on the side of the bed? : A Lot Difficulty sitting down on and standing up from a chair with arms (e.g., wheelchair, bedside commode, etc,.)?: A Lot Help needed moving to and from a bed to chair  (including a wheelchair)?: A Lot Help needed walking in hospital room?: A Lot Help needed climbing 3-5 steps with a railing? : Total 6 Click Score: 11    End of Session Equipment Utilized During Treatment: Gait belt Activity Tolerance: Patient tolerated treatment well;Patient limited by pain;Patient limited by fatigue Patient left: in chair;with chair alarm set;with call bell/phone within reach Nurse Communication: Mobility status PT Visit Diagnosis: Unsteadiness on feet (R26.81);Muscle weakness (generalized) (M62.81);Repeated falls (R29.6)    Time: 3474-2595 PT Time Calculation (min) (ACUTE ONLY): 26 min   Charges:   PT Evaluation $PT Eval Moderate Complexity: 1 Mod PT Treatments $Gait Training: 8-22 mins   PT G Codes:   PT G-Codes **NOT FOR INPATIENT CLASS** Functional Assessment Tool Used: AM-PAC 6 Clicks Basic Mobility Functional Limitation: Mobility: Walking and moving around Mobility: Walking and Moving Around Current Status (G3875): At least 60 percent but less than 80 percent impaired, limited or restricted Mobility: Walking and Moving Around Goal Status [phone]): At least 60 percent but less than 80 percent impaired, limited or restricted    Royce Macadamia PT, DPT, CSCS    12/24/2016, 4:20 PM

## 2016-12-24 NOTE — ED Notes (Signed)
Pt transport to 152 

## 2016-12-24 NOTE — Care Management Obs Status (Signed)
[facility] NOTIFICATION   Patient Details  Name: Kristi [name] MRN: [id number] Date of Birth: 12-11-1940   Medicare Observation Status Notification Given:  Yes    Nguyet Mercer A, RN 12/24/2016, 3:42 PM
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
## 2016-12-24 NOTE — ED Provider Notes (Signed)
Dg Pelvis 1-2 Views  Result Date: 12/23/2016 CLINICAL DATA:  Patient fell twice yesterday and wants today. Bilateral leg pain. Increased swelling to the legs. EXAM: PELVIS - 1-2 VIEW COMPARISON:  CT lumbar spine 04/29/2016.  Lumbar spine 04/12/2016. FINDINGS: Diffuse bone demineralization. Degenerative changes in the lower lumbar spine and in both hips. The pelvis appears intact. No evidence of acute fracture or dislocation. SI joints and symphysis pubis are not displaced. Visualized sacrum appears intact. Vascular calcifications. Intramedullary rod is partially identified in the right femur. IMPRESSION: No acute bony abnormalities. Degenerative changes in the lower lumbar spine and hips. Diffuse bone demineralization. Electronically Signed   By: Lucienne Capers M.D.   On: 12/23/2016 21:51   Ct Head Wo Contrast  Result Date: 12/23/2016 CLINICAL DATA:  [place] twice yesterday and once today, altered level of consciousness, lower extremity swelling, history hypertension, coronary artery disease post MI, PTCA, and CABG EXAM: CT HEAD WITHOUT CONTRAST TECHNIQUE: Contiguous axial images were obtained from the base of the skull through the vertex without intravenous contrast. Sagittal and coronal MPR images reconstructed from axial data set. COMPARISON:  12/04/2016 FINDINGS: Brain: Generalized atrophy. Normal ventricular morphology. No midline shift or mass effect. Small vessel chronic ischemic changes of deep cerebral white matter. No intracranial hemorrhage, mass lesion, evidence of acute infarction, or extra-axial fluid collection. Vascular: Atherosclerotic calcifications throughout internal carotid arteries at skullbase. Skull: Intact Sinuses/Orbits: Visualized paranasal sinuses and LEFT mastoid air cells clear. Partial opacification of RIGHT mastoid air cells, chronic. Other: N/A IMPRESSION: Atrophy with small vessel chronic ischemic changes of deep cerebral white matter. No acute intracranial abnormalities.  Electronically Signed   By: Lavonia Dana M.D.   On: 12/23/2016 22:21     Labs Reviewed  CBC WITH DIFFERENTIAL/PLATELET - Abnormal; Notable for the following:       Result Value   RBC 3.66 (*)    Hemoglobin 11.8 (*)    HCT 34.7 (*)    RDW 17.1 (*)    All other components within normal limits  COMPREHENSIVE METABOLIC PANEL - Abnormal; Notable for the following:    Potassium 2.8 (*)    Chloride 98 (*)    Glucose, Bld 188 (*)    Creatinine, Ser 1.15 (*)    AST 110 (*)    ALT 78 (*)    GFR calc non Af Amer 44 (*)    GFR calc Af Amer 51 (*)    All other components within normal limits  TROPONIN I - Abnormal; Notable for the following:    Troponin I 0.03 (*)    All other components within normal limits  TROPONIN I  MAGNESIUM  TSH  URINALYSIS, COMPLETE (UACMP) WITH MICROSCOPIC     Clinical Course as of Dec 25 802  Sat Dec 24, 2016  0006 Assuming care from Dr. Jimmye Norman.  In short, Kristi [name] is a 76 y.o. female with a chief complaint of fall and leg pain.  Refer to the original H&P for additional details.  The current plan of care is to follow up urinalysis and repeat troponin.   [CF]  0213 Second troponin is negative.  The patient has been resting comfortably.  I have treated her and we are going to see if she can walk.  If she can do so then she likely will need to go home and follow up as an outpatient as this presentation is consistent with several prior ED presentations in the past.  However today her potassium is low although  Dr. Jimmye Norman has repleted it and if she cannot ambulate she may need admission and placement.  [CF]    (Note that documentation was delayed due to multiple ED patients requiring immediate care.)  Although the patient was able to ambulate she did so very haltingly and was very unsteady on her feet.  Given that she has had numerous recent falls and has acute  Hypokalemia, I feel it is appropriate to observe her in the emergency department to make sure  that her electrolyte abnormalities resolve and to have her evaluated by PT/OT and social work to determine whether she needs a higher level of care and placement.  I discussed the case with Dr. Marcille Blanco the hospitalist who evaluated the patient in person and admitted her.   Clinical Course User Index [CF] Hinda Kehr, MD     Final diagnoses:  Weakness  Fall, initial encounter  Shuffling gait  Pain in both lower extremities  Hypokalemia       Hinda Kehr, MD 12/24/16 [phone]

## 2016-12-24 NOTE — ED Notes (Signed)
Assisted patient to ambulate from the stretcher to the commode and then ambulated to the room door and back to the stretcher; pt is ambulate with assistance; pt displays a steady, shuffling gait.

## 2016-12-24 NOTE — Evaluation (Signed)
Occupational Therapy Evaluation Patient Details Name: Kristi [name] MRN: [id number] DOB: 06-17-1940 Today's Date: 12/24/2016    History of Present Illness Pt is a 76 y.o.femalepresenting with hypokalemia with a known history of hyperlipidemia, hypertension, hypothyroidism, CAD s/p MI.    Clinical Impression   Pt seen for OT evaluation this date. Prior to hospital admission, pt was living in a mobile home by herself with 3-4 steps to enter, requiring consistent assistance from family for bathing, dressing (which is a change since prior admission), using [employer] for transportation, and has had Meals on Wheels (per recent prior admission). Pt reports that she has a shower chair but it will not fit inside tub. Currently pt is max assist for LB ADL, max assist for transfers (per PT; mobility declined this session due to pt recently up to recliner, fatigued, and in pain).  Pt would benefit from skilled OT to address noted impairments and functional limitations (see below for any additional details).  Upon hospital discharge, recommend pt discharge to [place].    Follow Up Recommendations  SNF    Equipment Recommendations  Tub/shower bench    Recommendations for Other Services       Precautions / Restrictions Precautions Precautions: Fall Restrictions Weight Bearing Restrictions: No      Mobility Bed Mobility               General bed mobility comments: deferred, pt recently up in recliner  Transfers                 General transfer comment: deferred, pt fatigued from PT, in pain    Balance Overall balance assessment: History of Falls;Needs assistance                                         ADL either performed or assessed with clinical judgement   ADL Overall ADL's : Needs assistance/impaired Eating/Feeding: Set up;Sitting   Grooming: Sitting;Set up   Upper Body Bathing: Sitting;Minimal assistance   Lower Body Bathing: Sitting/lateral  leans;Maximal assistance   Upper Body Dressing : Sitting;Minimal assistance   Lower Body Dressing: Sitting/lateral leans;Maximal assistance     Toilet Transfer Details (indicate cue type and reason): deferred, pt fatigued from PT, in pain                 Vision Baseline Vision/History: Wears glasses Wears Glasses: Reading only Patient Visual Report: No change from baseline Vision Assessment?: No apparent visual deficits     Perception     Praxis      Pertinent Vitals/Pain Pain Assessment: 0-10 Pain Score: 8  Pain Location: BLE, back Pain Intervention(s): Limited activity within patient's tolerance;Monitored during session;Patient requesting pain meds-RN notified     Hand Dominance Right   Extremity/Trunk Assessment Upper Extremity Assessment Upper Extremity Assessment: Generalized weakness (grossly 4-/5 bilaterally)   Lower Extremity Assessment Lower Extremity Assessment: Generalized weakness;Defer to PT evaluation       Communication Communication Communication: No difficulties   Cognition Arousal/Alertness: Awake/alert Behavior During Therapy: Flat affect Overall Cognitive Status: Within Functional Limits for tasks assessed                                     General Comments       Exercises     Shoulder Instructions  Home Living Family/patient expects to be discharged to:: Private residence Living Arrangements: Alone Available Help at Discharge: Family;Available PRN/intermittently Type of Home: Mobile home Home Access: Stairs to enter Entrance Stairs-Number of Steps: 4 Entrance Stairs-Rails: Right;Left;Can reach both Home Layout: One level     Bathroom Shower/Tub: Teacher, early years/pre: Handicapped height (toilet riser)     Home Equipment: Environmental consultant - 2 wheels;Cane - single point;Toilet riser;Hand held shower head          Prior Functioning/Environment Level of Independence: Needs assistance  Gait /  Transfers Assistance Needed: pt reports Ind Amb without AD ADL's / Homemaking Assistance Needed: pt reports requiring assist from family for bathing/dressing; does not drive, uses [employer] for transportation, reports multiple falls (7 falls in past 5 days), per recent admission, pt also had/has Meals on Wheels, indep with med mgt            OT Problem List: Decreased strength;Pain;Decreased activity tolerance;Decreased safety awareness;Decreased knowledge of use of DME or AE;Impaired balance (sitting and/or standing)      OT Treatment/Interventions: Self-care/ADL training;Therapeutic exercise;Therapeutic activities;Energy conservation;DME and/or AE instruction;Patient/family education    OT Goals(Current goals can be found in the care plan section) Acute Rehab OT Goals Patient Stated Goal: get stronger OT Goal Formulation: With patient Time For Goal Achievement: 01/07/17 Potential to Achieve Goals: Good  OT Frequency: Min 1X/week   Barriers to D/C: Inaccessible home environment;Decreased caregiver support  no family available to provide 24/7 assist if needed       Co-evaluation              AM-PAC PT "6 Clicks" Daily Activity     Outcome Measure Help from another person eating meals?: None Help from another person taking care of personal grooming?: None Help from another person toileting, which includes using toliet, bedpan, or urinal?: A Lot Help from another person bathing (including washing, rinsing, drying)?: A Lot Help from another person to put on and taking off regular upper body clothing?: A Little Help from another person to put on and taking off regular lower body clothing?: A Lot 6 Click Score: 17   End of Session    Activity Tolerance: Patient limited by pain Patient left: in chair;with call bell/phone within reach;with chair alarm set  OT Visit Diagnosis: Other abnormalities of gait and mobility (R26.89);Repeated falls (R29.6);Muscle weakness (generalized)  (M62.81);Pain Pain - part of body:  (back, BLE)                Time: 5573-2202 OT Time Calculation (min): 11 min Charges:  OT General Charges $OT Visit: 1 Visit OT Evaluation $OT Eval Low Complexity: 1 Low G-Codes: OT G-codes **NOT FOR INPATIENT CLASS** Functional Assessment Tool Used: AM-PAC 6 Clicks Daily Activity;Clinical judgement Functional Limitation: Self care Self Care Current Status (R4270): At least 40 percent but less than 60 percent impaired, limited or restricted Self Care Goal Status (W2376): At least 20 percent but less than 40 percent impaired, limited or restricted   Jeni Salles, MPH, MS, OTR/L ascom [phone] 12/24/16, 12:00 PM

## 2016-12-24 NOTE — Clinical Social Work Note (Signed)
Clinical Social Work Assessment  Patient Details  Name: Kristi [name] MRN: [id number] Date of Birth: 08/19/1940  Date of referral:  12/24/16               Reason for consult:  Facility Placement                Permission sought to share information with:  Facility Art therapist granted to share information::  Yes, Verbal Permission Granted  Name::        Agency::     Relationship::     Contact Information:     Housing/Transportation Living arrangements for the past 2 months:  Mobile Home Source of Information:  Medical Team, Adult Children Patient Interpreter Needed:  None Criminal Activity/Legal Involvement Pertinent to Current Situation/Hospitalization:  No - Comment as needed Significant Relationships:  Adult Children, Community Support Lives with:  Self Do you feel safe going back to the place where you live?  Yes Need for family participation in patient care:  No (Coment)  Care giving concerns:  PT recommendation for STR, Patient is observation status.   Social Worker assessment / plan:  The CSW contacted the patient's daughter to discuss discharge planning as the patient was having personal care when the CSW visited the bedside. The CSW explained Medicare observation status to the patient's daughter and reported the options for care including SNF paid out of pocket, continuation/re-instating Home Health, and possible LTC/ALF options. The CSW provided emotional support to [name] concerning her frustration as a caregiver with the Medicare system. The patient has had 3 admissions in the past 3 months, all of which have been observation status. [name] is worried that her mother will fall and break a hip which will then cause irreparable damage. The CSW validated Teresa's concerns and feelings.   The patient was open to [facility] for [place] and [place] for HHPT. The patient's daughter indicated that a Rollator may assist the client more effectively than a 2WRW. The plan  for discharge is on Monday according to chart notes from the attending MD. [name] is aware that an RNCM will reach out to facilitate discharge and address home needs. The CSW will meet with [name] at bedside to provide information about [employer] as a potential support within the community.   Employment status:  Retired Forensic scientist:  Medicare PT Recommendations:  [place] / Referral to community resources:  Other (Comment Required) ([facility])  Patient/Family's Response to care:  The patient was indisposed. The patient's daughter thanked the CSW for care and support.  Patient/Family's Understanding of and Emotional Response to Diagnosis, Current Treatment, and Prognosis:  The family has a basic understanding of the insurance barriers. They are disappointed that Medicare will not cover STR under observation status and are concerned for their loved one's long term health.  Emotional Assessment Appearance:  Appears stated age Attitude/Demeanor/Rapport:  Unable to Assess (Patient was receiving personal care) Affect (typically observed):  Unable to Assess (Patient was receiving personal care) Orientation:  Oriented to Self, Oriented to Place, Oriented to  Time, Oriented to Situation Alcohol / Substance use:  Never Used Psych involvement (Current and /or in the community):  No (Comment)  Discharge Needs  Concerns to be addressed:  Care Coordination, Discharge Planning Concerns Readmission within the last 30 days:  Yes Current discharge risk:  Chronically ill Barriers to Discharge:  Continued Medical Work up   [employer], LCSW 12/24/2016, 4:32 PM

## 2016-12-25 DIAGNOSIS — E876 Hypokalemia: Secondary | ICD-10-CM | POA: Diagnosis not present

## 2016-12-25 LAB — BASIC METABOLIC PANEL
Anion gap: 6 (ref 5–15)
BUN: 16 mg/dL (ref 6–20)
CO2: 31 mmol/L (ref 22–32)
Calcium: 8.8 mg/dL — ABNORMAL LOW (ref 8.9–10.3)
Chloride: 103 mmol/L (ref 101–111)
Creatinine, Ser: 1.03 mg/dL — ABNORMAL HIGH (ref 0.44–1.00)
GFR calc Af Amer: 59 mL/min — ABNORMAL LOW (ref >60)
GFR calc non Af Amer: 51 mL/min — ABNORMAL LOW (ref >60)
Glucose, Bld: 139 mg/dL — ABNORMAL HIGH (ref 65–99)
Potassium: 3.8 mmol/L (ref 3.5–5.1)
Sodium: 140 mmol/L (ref 135–145)

## 2016-12-25 LAB — URINALYSIS, COMPLETE (UACMP) WITH MICROSCOPIC
Bacteria, UA: NONE SEEN
Bilirubin Urine: NEGATIVE
Glucose, UA: NEGATIVE mg/dL
Hgb urine dipstick: NEGATIVE
Ketones, ur: NEGATIVE mg/dL
Leukocytes, UA: NEGATIVE
Nitrite: NEGATIVE
Protein, ur: NEGATIVE mg/dL
Specific Gravity, Urine: 1.011 (ref 1.005–1.030)
pH: 6 (ref 5.0–8.0)

## 2016-12-25 LAB — MAGNESIUM: Magnesium: 1.9 mg/dL (ref 1.7–2.4)

## 2016-12-25 MED ORDER — POTASSIUM CHLORIDE CRYS ER 20 MEQ PO TBCR
20.0000 meq | EXTENDED_RELEASE_TABLET | Freq: Every day | ORAL | Status: DC
  Administered 2016-12-25 – 2016-12-26 (×2): 20 meq via ORAL
  Filled 2016-12-25 (×3): qty 1

## 2016-12-25 MED ORDER — SODIUM CHLORIDE 0.9 % IV BOLUS (SEPSIS)
500.0000 mL | Freq: Once | INTRAVENOUS | Status: AC
  Administered 2016-12-25: 500 mL via INTRAVENOUS

## 2016-12-25 NOTE — Clinical Social Work Note (Signed)
CSW met with the patient and her daughter at bedside to discuss long term plans. The CSW provided information about ALFs and Museum/gallery curator. The CSW also provided emotional support to the patient's daughter. The CSW is signing off. Please consult should additional needs arise.  Santiago Bumpers, MSW, Latanya Presser [phone]

## 2016-12-25 NOTE — Care Management Note (Addendum)
Case Management Note  Patient Details  Name: Kristi [name] MRN: [id number] Date of Birth: 07/28/1940  Subjective/Objective:  Per Chelsey at [place], Kristi [name] is currently receiving home health PT, OT, and SW by [facility]. Family is requesting [name] hospital bed, [name] shower chair, [name] rollator, and [name] helmet. Will follow up with physician to determine what DME equipment orders that Kristi [name] will need at discharge. Weekend CSW discussed placement options for Observation status Kristi [name] with her daughter, and provided them with [name] list of ALF's that they could pursue. .  .                   Action/Plan:   Expected Discharge Date:                  Expected Discharge Plan:     In-House Referral:     Discharge planning Services     Post Acute Care Choice:    Choice offered to:     DME Arranged:    DME Agency:     HH Arranged:    HH Agency:     Status of Service:     If discussed at [place] of Stay Meetings, dates discussed:    Additional Comments:  Kristi Galster A, RN 12/25/2016, 11:41 AM

## 2016-12-25 NOTE — Progress Notes (Signed)
Pt's BP from previous shift=87/39. No rechecks documented. BP taken this time=89/46, pt alert and oriented with no complains. Dr Bridgett Larsson paged and ordered to discontinue Coreg and give NS 563ml bolus. Orders placed. Will administer and continue to monitor.

## 2016-12-25 NOTE — Progress Notes (Signed)
[place] at [place] NAME: Kristi [name]    MR#:  [id number]  DATE OF BIRTH:  [birth year]  SUBJECTIVE:  CHIEF COMPLAINT:   Chief Complaint  Patient presents with  . Fall  . Leg Pain   - Leg cramps are much better. -However still has significant weakness, risk of falls at home. Physical therapy recommended rehabilitation.  REVIEW OF SYSTEMS:  Review of Systems  Constitutional: Positive for malaise/fatigue. Negative for chills and fever.  HENT: Negative for congestion, ear discharge, hearing loss and nosebleeds.   Eyes: Negative for blurred vision and double vision.  Respiratory: Negative for cough, shortness of breath and wheezing.   Cardiovascular: Negative for chest pain, palpitations and leg swelling.  Gastrointestinal: Negative for abdominal pain, constipation, diarrhea, nausea and vomiting.  Genitourinary: Negative for dysuria.  Musculoskeletal: Negative for myalgias.  Neurological: Positive for weakness. Negative for dizziness, speech change, focal weakness, seizures and headaches.  Psychiatric/Behavioral: Negative for depression.    DRUG ALLERGIES:   Allergies  Allergen Reactions  . Penicillin G Swelling and Rash    Has patient had a PCN reaction causing immediate rash, facial/tongue/throat swelling, SOB or lightheadedness with hypotension: Yes Has patient had a PCN reaction causing severe rash involving mucus membranes or skin necrosis: No Has patient had a PCN reaction that required hospitalization: No Has patient had a PCN reaction occurring within the last 10 years: No If all of the above answers are "NO", then may proceed with Cephalosporin use.     VITALS:  Blood pressure 108/65, pulse 67, temperature 98.3 F (36.8 C), temperature source Oral, resp. rate 18, height 5\' 7"  (1.702 m), weight 78.3 kg (172 lb 9.6 oz), SpO2 96 %.  PHYSICAL EXAMINATION:  Physical Exam  GENERAL:  76 y.o.-year-old patient lying in the  bed with no acute distress.  EYES: Pupils equal, round, reactive to light and accommodation. No scleral icterus. Extraocular muscles intact.  HEENT: Head atraumatic, normocephalic. Oropharynx and nasopharynx clear.  NECK:  Supple, no jugular venous distention. No thyroid enlargement, no tenderness.  LUNGS: Normal breath sounds bilaterally, no wheezing, rales,rhonchi or crepitation. No use of accessory muscles of respiration.  CARDIOVASCULAR: S1, S2 normal. No murmurs, rubs, or gallops.  ABDOMEN: Soft, nontender, nondistended. Bowel sounds present. No organomegaly or mass.  EXTREMITIES: No pedal edema, cyanosis, or clubbing.  NEUROLOGIC: Cranial nerves II through XII are intact. Muscle strength 5/5 in all extremities. Sensation intact. Gait not checked.  PSYCHIATRIC: The patient is alert and oriented x 3.  SKIN: No obvious rash, lesion, or ulcer.    LABORATORY PANEL:   CBC  Recent Labs Lab 12/23/16 2146  WBC 5.9  HGB 11.8*  HCT 34.7*  PLT 153   ------------------------------------------------------------------------------------------------------------------  Chemistries   Recent Labs Lab 12/23/16 2146  12/25/16 0415  NA 139  --  140  K 2.8*  < > 3.8  CL 98*  --  103  CO2 32  --  31  GLUCOSE 188*  --  139*  BUN 10  --  16  CREATININE 1.15*  --  1.03*  CALCIUM 9.0  --  8.8*  MG 1.9  --  1.9  AST 110*  --   --   ALT 78*  --   --   ALKPHOS 71  --   --   BILITOT 0.5  --   --   < > = values in this interval not displayed. ------------------------------------------------------------------------------------------------------------------  Cardiac Enzymes  Recent Labs  Lab 12/24/16 0005  TROPONINI <0.03   ------------------------------------------------------------------------------------------------------------------  RADIOLOGY:  Dg Pelvis 1-2 Views  Result Date: 12/23/2016 CLINICAL DATA:  Patient fell twice yesterday and wants today. Bilateral leg pain. Increased  swelling to the legs. EXAM: PELVIS - 1-2 VIEW COMPARISON:  CT lumbar spine 04/29/2016.  Lumbar spine 04/12/2016. FINDINGS: Diffuse bone demineralization. Degenerative changes in the lower lumbar spine and in both hips. The pelvis appears intact. No evidence of acute fracture or dislocation. SI joints and symphysis pubis are not displaced. Visualized sacrum appears intact. Vascular calcifications. Intramedullary rod is partially identified in the right femur. IMPRESSION: No acute bony abnormalities. Degenerative changes in the lower lumbar spine and hips. Diffuse bone demineralization. Electronically Signed   By: Lucienne Capers M.D.   On: 12/23/2016 21:51   Ct Head Wo Contrast  Result Date: 12/23/2016 CLINICAL DATA:  [place] twice yesterday and once today, altered level of consciousness, lower extremity swelling, history hypertension, coronary artery disease post MI, PTCA, and CABG EXAM: CT HEAD WITHOUT CONTRAST TECHNIQUE: Contiguous axial images were obtained from the base of the skull through the vertex without intravenous contrast. Sagittal and coronal MPR images reconstructed from axial data set. COMPARISON:  12/04/2016 FINDINGS: Brain: Generalized atrophy. Normal ventricular morphology. No midline shift or mass effect. Small vessel chronic ischemic changes of deep cerebral white matter. No intracranial hemorrhage, mass lesion, evidence of acute infarction, or extra-axial fluid collection. Vascular: Atherosclerotic calcifications throughout internal carotid arteries at skullbase. Skull: Intact Sinuses/Orbits: Visualized paranasal sinuses and LEFT mastoid air cells clear. Partial opacification of RIGHT mastoid air cells, chronic. Other: N/A IMPRESSION: Atrophy with small vessel chronic ischemic changes of deep cerebral white matter. No acute intracranial abnormalities. Electronically Signed   By: Lavonia Dana M.D.   On: 12/23/2016 22:21    EKG:   Orders placed or performed during the hospital encounter of  12/23/16  . EKG 12-Lead  . EKG 12-Lead  . ED EKG  . ED EKG  . EKG 12-Lead  . EKG 12-Lead    ASSESSMENT AND PLAN:   76 year old female with past medical history significant for CAD, hypertension, hyper there is some, depression presents to hospital secondary to 2 week history of weakness and leg cramps.  #1 extreme weakness and leg cramps-could be from hypokalemia. -Check urine analysis to rule out infection -Physical therapy recommended rehabilitation -However patient is under observation, will set up home health at discharge  #2 hypokalemia-secondary to being on Lasix and not taking oral supplementation for the last few days -Being replaced aggressively. Hold Lasix for now and follow up - magnesium within normal limits  #3 hypertension-continue Coreg  #4 CAD - stable. Continue aspirin and Plavix  #5 depression-continue home medications  #6 DVT prophylaxis-heparin subcutaneous   Discussed with daughter at bedside. Daughter is very concerned about your discharge. She wants mom to go to rehabilitation. Discussed with social worker and case manager to assist with providing resources for help at home.   All the records are reviewed and case discussed with Care Management/Social Workerr. Management plans discussed with the patient, family and they are in agreement.  CODE STATUS: Full Code  TOTAL TIME TAKING CARE OF THIS PATIENT: 36 minutes.   POSSIBLE D/C TOMORROW, DEPENDING ON CLINICAL CONDITION.   Gladstone Lighter M.D on 12/25/2016 at 10:08 AM  Between 7am to 6pm - Pager - [phone]  After 6pm go to [email or web address] - password [email or web address] [facility]  Office  [phone]  CC: Primary care physician; Lorelee Market, MD

## 2016-12-26 DIAGNOSIS — E876 Hypokalemia: Secondary | ICD-10-CM | POA: Diagnosis not present

## 2016-12-26 DIAGNOSIS — M48061 Spinal stenosis, lumbar region without neurogenic claudication: Secondary | ICD-10-CM | POA: Diagnosis not present

## 2016-12-26 LAB — BASIC METABOLIC PANEL
Anion gap: 4 — ABNORMAL LOW (ref 5–15)
BUN: 19 mg/dL (ref 6–20)
CO2: 29 mmol/L (ref 22–32)
Calcium: 8.7 mg/dL — ABNORMAL LOW (ref 8.9–10.3)
Chloride: 110 mmol/L (ref 101–111)
Creatinine, Ser: 0.92 mg/dL (ref 0.44–1.00)
GFR calc Af Amer: >60 mL/min (ref >60)
GFR calc non Af Amer: 58 mL/min — ABNORMAL LOW (ref >60)
Glucose, Bld: 140 mg/dL — ABNORMAL HIGH (ref 65–99)
Potassium: 3.9 mmol/L (ref 3.5–5.1)
Sodium: 143 mmol/L (ref 135–145)

## 2016-12-26 MED ORDER — GABAPENTIN 300 MG PO CAPS: 300.0000 mg | ORAL_CAPSULE | Freq: Every day | ORAL | Status: DC

## 2016-12-26 NOTE — Clinical Social Work Note (Signed)
CSW received referral for SNF.  Case discussed with case manager and plan is to discharge home with home health.  CSW to sign off please re-consult if social work needs arise.  Jennah Satchell R. Kimberli Winne, MSW, LCSWA [phone]  

## 2016-12-26 NOTE — Progress Notes (Signed)
Discharge summary reviewed. Answered all questions. EMS called for transport

## 2016-12-26 NOTE — Care Management Note (Addendum)
Case Management Note  Patient Details  Name: Kristi [name] MRN: [id number] Date of Birth: 31-Jul-1940  Subjective/Objective:  Met with daughter at bedside and explained why her mother could not go to SNF under Medicare. Offered for her to pay privately and she declined. Ordered a BSC, Wheelchair and hospital bed at daughters request from [facility].  Daughter is concerned that her mother has no one to take care of her because she works and there is no one else to assist her. SW from [facility] is working on placement for patient at [facility] per the daughter. Provided emotional support.   OT will be added to her home health care                    Action/Plan: Resumption of care with Alvis Lemmings, DME with [facility].   Expected Discharge Date:  12/26/16               Expected Discharge Plan:  [place]  In-House Referral:  NA  Discharge planning Services  NA  Post Acute Care Choice:  Durable Medical Equipment Choice offered to:  Adult Children  DME Arranged:    DME Agency:     HH Arranged:  PT,HHA, SW, SN HH Agency:  [place]  Status of Service:  In process, will continue to follow  If discussed at Long Length of Stay Meetings, dates discussed:    Additional Comments:  Kristi Mango, RN 12/26/2016, 2:36 PM
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
## 2016-12-26 NOTE — Progress Notes (Signed)
Occupational Therapy Treatment Patient Details Name: Kristi [name] MRN: [id number] DOB: [birth year] Today's Date: 12/26/2016    History of present illness Pt is a 76 y.o.femalepresenting with hypokalemia with a known history of hyperlipidemia, hypertension, hypothyroidism, CAD s/p MI.    OT comments  Pt seen for OT treatment this date. Daughter in room for session. Education/training in functional transfers, energy conservation strategies, body positioning, home/routines modifications, falls prevention, and AE/DME to support maximal functional independence and safety. Pt/dtr verbalized understanding of all education provided. Handout provided. Pt with improving functional mobility this date, requiring min-mod assist for sit to stand decreasing to min assist on 2nd transfer and min guard to min assist for balance during limited ambulation in room with no overt LOB and good adherence to verbal cues/instruction for hand/foot placement to maximize safety. Pt/daughter report pt is returning home this date with a hospital bed, BSC, wheelchair and home health services. Pt continues to benefit from skilled OT services to maximize safety and independence.    Follow Up Recommendations  SNF    Equipment Recommendations  Tub/shower bench    Recommendations for Other Services      Precautions / Restrictions Precautions Precautions: Fall Restrictions Weight Bearing Restrictions: No       Mobility Bed Mobility               General bed mobility comments: deferred, pt up in recliner  Transfers Overall transfer level: Needs assistance Equipment used: Rolling walker (2 wheeled) Transfers: Sit to/from Stand Sit to Stand: Min assist;Mod assist         General transfer comment: verbal cues for foot and hand placement to maximize safety    Balance Overall balance assessment: History of Falls;Needs assistance Sitting-balance support: Bilateral upper extremity supported Sitting  balance-Leahy Scale: Fair     Standing balance support: Bilateral upper extremity supported Standing balance-Leahy Scale: Poor Standing balance comment: did better after slight weightshifting and verbal cues for wide base of support                           ADL either performed or assessed with clinical judgement   ADL Overall ADL's : Needs assistance/impaired                       Lower Body Dressing Details (indicate cue type and reason): pt/daugther educated in AE for LB dressing and body positioning to maximize safety and functional independence, both verbalized understanding             Functional mobility during ADLs: Min guard;Minimal assistance;Rolling walker       Vision Baseline Vision/History: Wears glasses Wears Glasses: Reading only Patient Visual Report: No change from baseline     Perception     Praxis      Cognition Arousal/Alertness: Awake/alert Behavior During Therapy: Flat affect Overall Cognitive Status: Within Functional Limits for tasks assessed                                          Exercises Other Exercises Other Exercises: Pt/daughter educated in energy conservation strategies including home/routines modifications, falls prevention, work simplification, AE/DME, and positioning considerations to maximize functional independence and safety while minimizing further decline and increased caregiver assistance. Pt/daughter verbalized understanding and handout provided. Other Exercises: Pt educated in functional transfer training with verbal cues and  visual demonstration of hand/foot placement with explaination of risks/benefits to technique and maximizing safety. Pt/daughter verbalized understanding and pt able to return demonstrate with verbal cues   Shoulder Instructions       General Comments      Pertinent Vitals/ Pain       Pain Assessment: 0-10 Pain Score: 8  Pain Location: BLE, back Pain  Descriptors / Indicators: Aching Pain Intervention(s): Limited activity within patient's tolerance;Monitored during session;Repositioned;Patient requesting pain meds-RN notified  Home Living                                          Prior Functioning/Environment              Frequency  Min 1X/week        Progress Toward Goals  OT Goals(current goals can now be found in the care plan section)  Progress towards OT goals: Progressing toward goals  Acute Rehab OT Goals Patient Stated Goal: get stronger OT Goal Formulation: With patient Time For Goal Achievement: 01/07/17 Potential to Achieve Goals: Good  Plan Discharge plan remains appropriate;Frequency remains appropriate    Co-evaluation                 AM-PAC PT "6 Clicks" Daily Activity     Outcome Measure   Help from another person eating meals?: None Help from another person taking care of personal grooming?: None Help from another person toileting, which includes using toliet, bedpan, or urinal?: A Lot Help from another person bathing (including washing, rinsing, drying)?: A Lot Help from another person to put on and taking off regular upper body clothing?: A Little Help from another person to put on and taking off regular lower body clothing?: A Lot 6 Click Score: 17    End of Session Equipment Utilized During Treatment: Gait belt;Rolling walker  OT Visit Diagnosis: Other abnormalities of gait and mobility (R26.89);Repeated falls (R29.6);Muscle weakness (generalized) (M62.81);Pain Pain - part of body: Leg (back and BLE)   Activity Tolerance Patient tolerated treatment well   Patient Left in chair;with call bell/phone within reach;with chair alarm set;with family/visitor present   Nurse Communication Patient requests pain meds        Time: 2878-6767 OT Time Calculation (min): 38 min  Charges: OT General Charges $OT Visit: 1 Visit OT Treatments $Self Care/Home Management :  38-52 mins  Jeni Salles, MPH, MS, OTR/L ascom [phone] 12/26/16, 3:59 PM

## 2016-12-26 NOTE — Progress Notes (Signed)
Physical Therapy Treatment Patient Details Name: Kristi [name] MRN: [id number] DOB: 07-19-1940 Today's Date: 12/26/2016    History of Present Illness Pt is a 76 y.o.femalepresenting with hypokalemia with a known history of hyperlipidemia, hypertension, hypothyroidism, CAD s/p MI.     PT Comments    Patient with improved mobility compared to initial evaluation, as evidenced by less physical assist required for all functional activities (min/mod assist +1).  However, continues with generalized LE weakness, soreness which impair balance and overall activity tolerance.  Increased fall risk evident; unsafe to attempt without RW and +1 at this time.   Continue to recommend STR upon discharge for continued therapy prior to return home alone. Did discuss patient with nursing; encouraged discontinuation of external catheter to promote increased mobility outside of therapy for optimal recovery of functional strength/endurance.  Nursing voiced understanding/agremenet.   Follow Up Recommendations  SNF     Equipment Recommendations       Recommendations for Other Services       Precautions / Restrictions Precautions Precautions: Fall Restrictions Weight Bearing Restrictions: No    Mobility  Bed Mobility Overal bed mobility: Needs Assistance Bed Mobility: Supine to Sit     Supine to sit: Min assist     General bed mobility comments: increased time, use of bedrail to complete  Transfers Overall transfer level: Needs assistance Equipment used: Rolling walker (2 wheeled) Transfers: Sit to/from Stand Sit to Stand: Mod assist         General transfer comment: cuing for hand placement; assist for lift off for forward weight shift  Ambulation/Gait Ambulation/Gait assistance: Min assist Ambulation Distance (Feet): 25 Feet Assistive device: Rolling walker (2 wheeled)       General Gait Details: reciprocal stepping pattern, but with decreased step height/length bilat; forward  flexed posture, mildly antalgic R > L; decreased cadence, reaction time.  Increased fall risk evident; unsafe to attempt without RW and +1 at this time.   Stairs            Wheelchair Mobility    Modified Rankin (Stroke Patients Only)       Balance Overall balance assessment: Needs assistance Sitting-balance support: No upper extremity supported;Feet supported Sitting balance-Leahy Scale: Good     Standing balance support: Bilateral upper extremity supported Standing balance-Leahy Scale: Fair Standing balance comment: did better after slight weightshifting and verbal cues for wide base of support                            Cognition Arousal/Alertness: Awake/alert Behavior During Therapy: WFL for tasks assessed/performed;Flat affect Overall Cognitive Status: Within Functional Limits for tasks assessed                                        Exercises Other Exercises Other Exercises: Sit/stand x5 with RW, min/mod assist with assist for lift off, weight shift and standing balance Other Exercises: Pt educated in functional transfer training with verbal cues and visual demonstration of hand/foot placement with explaination of risks/benefits to technique and maximizing safety. Pt/daughter verbalized understanding and pt able to return demonstrate with verbal cues    General Comments        Pertinent Vitals/Pain Pain Assessment: Faces Pain Score: 8  Faces Pain Scale: Hurts even more Pain Location: R LE, R shoulder Pain Descriptors / Indicators: Aching;Grimacing;Sore Pain Intervention(s): Limited activity within  patient's tolerance;Monitored during session;Repositioned    Home Living                      Prior Function            PT Goals (current goals can now be found in the care plan section) Acute Rehab PT Goals Patient Stated Goal: get stronger PT Goal Formulation: With patient Time For Goal Achievement: 01/07/17 Potential  to Achieve Goals: Fair Progress towards PT goals: Progressing toward goals    Frequency    Min 2X/week      PT Plan Current plan remains appropriate    Co-evaluation              AM-PAC PT "6 Clicks" Daily Activity  Outcome Measure  Difficulty turning over in bed (including adjusting bedclothes, sheets and blankets)?: Unable Difficulty moving from lying on back to sitting on the side of the bed? : Unable Difficulty sitting down on and standing up from a chair with arms (e.g., wheelchair, bedside commode, etc,.)?: Unable Help needed moving to and from a bed to chair (including a wheelchair)?: A Lot Help needed walking in hospital room?: A Lot Help needed climbing 3-5 steps with a railing? : A Lot 6 Click Score: 9    End of Session Equipment Utilized During Treatment: Gait belt Activity Tolerance: Patient tolerated treatment well Patient left: in chair;with call bell/phone within reach;with chair alarm set Nurse Communication: Mobility status PT Visit Diagnosis: Unsteadiness on feet (R26.81);Muscle weakness (generalized) (M62.81);Repeated falls (R29.6);Difficulty in walking, not elsewhere classified (R26.2)     Time: 1655-3748 PT Time Calculation (min) (ACUTE ONLY): 23 min  Charges:  $Gait Training: 8-22 mins $Therapeutic Activity: 8-22 mins                    G Codes:       Oseias Horsey H. Owens Shark, PT, DPT, NCS 12/26/16, 5:05 PM [phone]

## 2017-01-03 ENCOUNTER — Telehealth: Payer: Self-pay | Admitting: Pain Medicine

## 2017-01-03 NOTE — Telephone Encounter (Signed)
Patient lvmail on Mon at 3:39 and 6:39 . She has been falling a lot and had to go to hospital. Should she still have procedure on the 30th ? Seems like her legs were not working but now they are some better

## 2017-01-03 NOTE — Telephone Encounter (Signed)
Spoke with patient.  States her potassium was low and that is why we was falling.  States  She is feeling better.  Potassium is within normal limits now.  Informed patient that she could come for her procedure but if anything new came up, to call us.

## 2017-01-17 ENCOUNTER — Ambulatory Visit (HOSPITAL_BASED_OUTPATIENT_CLINIC_OR_DEPARTMENT_OTHER): Payer: Medicare Other | Admitting: Pain Medicine

## 2017-01-17 ENCOUNTER — Encounter: Payer: Self-pay | Admitting: Pain Medicine

## 2017-01-17 ENCOUNTER — Ambulatory Visit
Admission: RE | Admit: 2017-01-17 | Discharge: 2017-01-17 | Disposition: A | Discharge status: Home / Self Care | Payer: Medicare Other | Source: Ambulatory Visit | Attending: Pain Medicine | Admitting: Pain Medicine

## 2017-01-17 VITALS — BP 136/79 | HR 60 | Temp 98.0°F | Resp 24 | Ht 66.0 in | Wt 150.0 lb

## 2017-01-17 DIAGNOSIS — Z880 Allergy status to penicillin: Secondary | ICD-10-CM | POA: Diagnosis not present

## 2017-01-17 DIAGNOSIS — M79605 Pain in left leg: Secondary | ICD-10-CM | POA: Insufficient documentation

## 2017-01-17 DIAGNOSIS — M47816 Spondylosis without myelopathy or radiculopathy, lumbar region: Secondary | ICD-10-CM

## 2017-01-17 DIAGNOSIS — G8918 Other acute postprocedural pain: Secondary | ICD-10-CM | POA: Insufficient documentation

## 2017-01-17 DIAGNOSIS — M5441 Lumbago with sciatica, right side: Secondary | ICD-10-CM

## 2017-01-17 DIAGNOSIS — G8929 Other chronic pain: Secondary | ICD-10-CM | POA: Diagnosis not present

## 2017-01-17 DIAGNOSIS — Z7982 Long term (current) use of aspirin: Secondary | ICD-10-CM | POA: Diagnosis not present

## 2017-01-17 DIAGNOSIS — M4726 Other spondylosis with radiculopathy, lumbar region: Secondary | ICD-10-CM

## 2017-01-17 DIAGNOSIS — Z951 Presence of aortocoronary bypass graft: Secondary | ICD-10-CM | POA: Insufficient documentation

## 2017-01-17 DIAGNOSIS — M79604 Pain in right leg: Secondary | ICD-10-CM | POA: Diagnosis present

## 2017-01-17 DIAGNOSIS — Z7951 Long term (current) use of inhaled steroids: Secondary | ICD-10-CM | POA: Diagnosis not present

## 2017-01-17 DIAGNOSIS — Z90710 Acquired absence of both cervix and uterus: Secondary | ICD-10-CM | POA: Insufficient documentation

## 2017-01-17 DIAGNOSIS — Z79891 Long term (current) use of opiate analgesic: Secondary | ICD-10-CM | POA: Diagnosis not present

## 2017-01-17 DIAGNOSIS — Z950 Presence of cardiac pacemaker: Secondary | ICD-10-CM | POA: Diagnosis not present

## 2017-01-17 DIAGNOSIS — M5442 Lumbago with sciatica, left side: Secondary | ICD-10-CM | POA: Diagnosis not present

## 2017-01-17 DIAGNOSIS — M488X6 Other specified spondylopathies, lumbar region: Secondary | ICD-10-CM | POA: Diagnosis not present

## 2017-01-17 DIAGNOSIS — Z955 Presence of coronary angioplasty implant and graft: Secondary | ICD-10-CM | POA: Diagnosis not present

## 2017-01-17 DIAGNOSIS — Z79899 Other long term (current) drug therapy: Secondary | ICD-10-CM | POA: Diagnosis not present

## 2017-01-17 MED ORDER — ROPIVACAINE HCL 2 MG/ML IJ SOLN
9.0000 mL | Freq: Once | INTRAMUSCULAR | Status: AC
  Administered 2017-01-17: 10 mL via PERINEURAL
  Filled 2017-01-17: qty 10

## 2017-01-17 MED ORDER — MIDAZOLAM HCL 5 MG/5ML IJ SOLN
1.0000 mg | INTRAMUSCULAR | Status: AC | PRN
  Administered 2017-01-17: 2 mg via INTRAVENOUS
  Filled 2017-01-17: qty 5

## 2017-01-17 MED ORDER — FENTANYL CITRATE (PF) 100 MCG/2ML IJ SOLN
25.0000 ug | INTRAMUSCULAR | Status: AC | PRN
  Filled 2017-01-17: qty 2

## 2017-01-17 MED ORDER — LACTATED RINGERS IV SOLN
1000.0000 mL | Freq: Once | INTRAVENOUS | Status: AC
  Administered 2017-01-17: 1000 mL via INTRAVENOUS

## 2017-01-17 MED ORDER — TRIAMCINOLONE ACETONIDE 40 MG/ML IJ SUSP
40.0000 mg | Freq: Once | INTRAMUSCULAR | Status: AC
  Administered 2017-01-17: 40 mg
  Filled 2017-01-17: qty 1

## 2017-01-17 MED ORDER — LIDOCAINE HCL 2 % IJ SOLN
10.0000 mL | Freq: Once | INTRAMUSCULAR | Status: AC
  Administered 2017-01-17: 400 mg
  Filled 2017-01-17: qty 20

## 2017-01-17 MED ORDER — OXYCODONE-ACETAMINOPHEN 5-325 MG PO TABS: 1.0000 | ORAL_TABLET | Freq: Four times a day (QID) | ORAL | 0 refills | Status: AC | PRN

## 2017-01-17 NOTE — Patient Instructions (Addendum)
____________________________________________________________________________________________  Post-Procedure instructions Instructions:  Apply ice: Fill a plastic sandwich bag with crushed ice. Cover it with a small towel and apply to injection site. Apply for 15 minutes then remove x 15 minutes. Repeat sequence on day of procedure, until you go to bed. The purpose is to minimize swelling and discomfort after procedure.  Apply heat: Apply heat to procedure site starting the day following the procedure. The purpose is to treat any soreness and discomfort from the procedure.  Food intake: Start with clear liquids (like water) and advance to regular food, as tolerated.   Physical activities: Keep activities to a minimum for the first 8 hours after the procedure.   Driving: If you have received any sedation, you are not allowed to drive for 24 hours after your procedure.  Blood thinner: Restart your blood thinner 6 hours after your procedure. (Only for those taking blood thinners)  Insulin: As soon as you can eat, you may resume your normal dosing schedule. (Only for those taking insulin)  Infection prevention: Keep procedure site clean and dry.  Post-procedure Pain Diary: Extremely important that this be done correctly and accurately. Recorded information will be used to determine the next step in treatment.  Pain evaluated is that of treated area only. Do not include pain from an untreated area.  Complete every hour, on the hour, for the initial 8 hours. Set an alarm to help you do this part accurately.  Do not go to sleep and have it completed later. It will not be accurate.  Follow-up appointment: Keep your follow-up appointment after the procedure. Usually 2 weeks for most procedures. (6 weeks in the case of radiofrequency.) Bring you pain diary.  Expect:  From numbing medicine (AKA: Local Anesthetics): Numbness or decrease in pain.  Onset: Full effect within 15 minutes of  injected.  Duration: It will depend on the type of local anesthetic used. On the average, 1 to 8 hours.   From steroids: Decrease in swelling or inflammation. Once inflammation is improved, relief of the pain will follow.  Onset of benefits: Depends on the amount of swelling present. The more swelling, the longer it will take for the benefits to be seen. In some cases, up to 10 days.  Duration: Steroids will stay in the system x 2 weeks. Duration of benefits will depend on multiple posibilities including persistent irritating factors.  From procedure: Some discomfort is to be expected once the numbing medicine wears off. This should be minimal if ice and heat are applied as instructed. Call if:  You experience numbness and weakness that gets worse with time, as opposed to wearing off.  New onset bowel or bladder incontinence. (Spinal procedures only)  Emergency Numbers:  Durning business hours (Monday - Thursday, 8:00 AM - 4:00 PM) (Friday, 9:00 AM - 12:00 Noon): [phone]  After hours: [phone] ____________________________________________________________________________________________  ____________________________________________________________________________________________  Preparing for Procedure with Sedation Instructions: . Oral Intake: Do not eat or drink anything for at least 8 hours prior to your procedure. . Transportation: Public transportation is not allowed. Bring an adult driver. The driver must be physically present in our waiting room before any procedure can be started. . Physical Assistance: Bring an adult physically capable of assisting you, in the event you need help. This adult should keep you company at home for at least 6 hours after the procedure. . Blood Pressure Medicine: Take your blood pressure medicine with a sip of water the morning of the procedure. . Blood   thinners:  . Diabetics on insulin: Notify the staff so that you can be scheduled  1st case in the morning. If your diabetes requires high dose insulin, take only  of your normal insulin dose the morning of the procedure and notify the staff that you have done so. . Preventing infections: Shower with an antibacterial soap the morning of your procedure. . Build-up your immune system: Take 1000 mg of Vitamin C with every meal (3 times a day) the day prior to your procedure. [name] Antibiotics: Inform the staff if you have a condition or reason that requires you to take antibiotics before dental procedures. . Pregnancy: If you are pregnant, call and cancel the procedure. . Sickness: If you have a cold, fever, or any active infections, call and cancel the procedure. . Arrival: You must be in the facility at least 30 minutes prior to your scheduled procedure. . Children: Do not bring children with you. . Dress appropriately: Bring dark clothing that you would not mind if they get stained. . Valuables: Do not bring any jewelry or valuables. Procedure appointments are reserved for interventional treatments only. [name] No Prescription Refills. . No medication changes will be discussed during procedure appointments. . No disability issues will be discussed. ____________________________________________________________________________________________  Pain Management Discharge Instructions  General Discharge Instructions :  If you need to reach your doctor call: Monday-Friday 8:00 am - 4:00 pm at [phone] or toll free [phone].  After clinic hours [phone] to have operator reach doctor.  Bring all of your medication bottles to all your appointments in the pain clinic.  To cancel or reschedule your appointment with Pain Management please remember to call 24 hours in advance to avoid a fee.  Refer to the educational materials which you have been given on: General Risks, I had my Procedure. Discharge Instructions, Post Sedation.  Post Procedure Instructions:  The drugs you  were given will stay in your system until tomorrow, so for the next 24 hours you should not drive, make any legal decisions or drink any alcoholic beverages.  You may eat anything you prefer, but it is better to start with liquids then soups and crackers, and gradually work up to solid foods.  Please notify your doctor immediately if you have any unusual bleeding, trouble breathing or pain that is not related to your normal pain.  Depending on the type of procedure that was done, some parts of your body may feel week and/or numb.  This usually clears up by tonight or the next day.  Walk with the use of an assistive device or accompanied by an adult for the 24 hours.  You may use ice on the affected area for the first 24 hours.  Put ice in a Ziploc bag and cover with a towel and place against area 15 minutes on 15 minutes off.  You may switch to heat after 24 hours.Radiofrequency Lesioning, Care After Refer to this sheet in the next few weeks. These instructions provide you with information about caring for yourself after your procedure. Your health care provider may also give you more specific instructions. Your treatment has been planned according to current medical practices, but problems sometimes occur. Call your health care provider if you have any problems or questions after your procedure. What can I expect after the procedure? After the procedure, it is common to have:  Pain from the burned nerve.  Temporary numbness.  Follow these instructions at home:  Take over-the-counter and prescription medicines only as told  by your health care provider.  Return to your normal activities as told by your health care provider. Ask your health care provider what activities are safe for you.  Pay close attention to how you feel after the procedure. If you start to have pain, write down when it hurts and how it feels. This will help you and your health care provider to know if you need an  additional treatment.  Check your needle insertion site every day for signs of infection. Watch for: ? Redness, swelling, or pain. ? Fluid, blood, or pus.  Keep all follow-up visits as told by your health care provider. This is important. Contact a health care provider if:  Your pain does not get better.  You have redness, swelling, or pain at the needle insertion site.  You have fluid, blood, or pus coming from the needle insertion site.  You have a fever. Get help right away if:  You develop sudden, severe pain.  You develop numbness or tingling near the procedure site that does not go away. This information is not intended to replace advice given to you by your health care provider. Make sure you discuss any questions you have with your health care provider. Document Released: 11/04/2010 Document Revised: 08/13/2015 Document Reviewed: 04/14/2014 Elsevier Interactive Patient Education  2018 [employer]. Radiofrequency Lesioning Radiofrequency lesioning is a procedure that is performed to relieve pain. The procedure is often used for back, neck, or arm pain. Radiofrequency lesioning involves the use of a machine that creates radio waves to make heat. During the procedure, the heat is applied to the nerve that carries the pain signal. The heat damages the nerve and interferes with the pain signal. Pain relief usually starts about 2 weeks after the procedure and lasts for 6 months to 1 year. Tell a health care provider about:  Any allergies you have.  All medicines you are taking, including vitamins, herbs, eye drops, creams, and over-the-counter medicines.  Any problems you or family members have had with anesthetic medicines.  Any blood disorders you have.  Any surgeries you have had.  Any medical conditions you have.  Whether you are pregnant or may be pregnant. What are the risks? Generally, this is a safe procedure. However, problems may occur, including:  Pain or  soreness at the injection site.  Infection at the injection site.  Damage to nerves or blood vessels.  What happens before the procedure?  Ask your health care provider about: ? Changing or stopping your regular medicines. This is especially important if you are taking diabetes medicines or blood thinners. ? Taking medicines such as aspirin and ibuprofen. These medicines can thin your blood. Do not take these medicines before your procedure if your health care provider instructs you not to.  Follow instructions from your health care provider about eating or drinking restrictions.  Plan to have someone take you home after the procedure.  If you go home right after the procedure, plan to have someone with you for 24 hours. What happens during the procedure?  You will be given one or more of the following: ? A medicine to help you relax (sedative). ? A medicine to numb the area (local anesthetic).  You will be awake during the procedure. You will need to be able to talk with the health care provider during the procedure.  With the help of a type of X-ray (fluoroscopy), the health care provider will insert a radiofrequency needle into the area to be treated.  Next, a wire that carries the radio waves (electrode) will be put through the radiofrequency needle. An electrical pulse will be sent through the electrode to verify the correct nerve. You will feel a tingling sensation, and you may have muscle twitching.  Then, the tissue that is around the needle tip will be heated by an electric current that is passed using the radiofrequency machine. This will numb the nerves.  A bandage (dressing) will be put on the insertion area after the procedure is done. The procedure may vary among health care providers and hospitals. What happens after the procedure?  Your blood pressure, heart rate, breathing rate, and blood oxygen level will be monitored often until the medicines you were given have  worn off.  Return to your normal activities as directed by your health care provider. This information is not intended to replace advice given to you by your health care provider. Make sure you discuss any questions you have with your health care provider. Document Released: 11/03/2010 Document Revised: 08/13/2015 Document Reviewed: 04/14/2014 Elsevier Interactive Patient Education  [employer].

## 2017-01-17 NOTE — Progress Notes (Signed)
Safety precautions to be maintained throughout the outpatient stay will include: orient to surroundings, keep bed in low position, maintain call bell within reach at all times, provide assistance with transfer out of bed and ambulation.   Medication list may be inaccurate. Patient states medications have changed, she does not have an updated list.

## 2017-01-17 NOTE — Progress Notes (Signed)
Patient's Name: Kristi [name]  MRN: [id number]  Referring Provider: Milinda Pointer, MD  DOB: 1940-05-22  PCP: Lorelee Market, MD  DOS: 01/17/2017  Note by: Gaspar Cola, MD  Service setting: Ambulatory outpatient  Specialty: Interventional Pain Management  Patient type: Established  Location: [facility] (AMB) Pain Management Facility  Visit type: Interventional Procedure   Primary Reason for Visit: Interventional Pain Management Treatment. CC: Leg Pain (back of both legs)  Procedure:  Anesthesia, Analgesia, Anxiolysis:  Type: Therapeutic Medial Branch Facet Radiofrequency Ablation Region: Lumbar Level: L2, L3, L4, L5, & S1 Medial Branch Level(s) Laterality: Right-Sided  Type: Local Anesthesia with Moderate (Conscious) Sedation Local Anesthetic: Lidocaine 1% Route: Intravenous (IV) IV Access: Secured Sedation: Meaningful verbal contact was maintained at all times during the procedure  Indication(s): Analgesia and Anxiety   Indications: 1. Lumbar facet syndrome (Bilateral) (R>L)   2. Lumbar facet osteoarthritis   3. Lumbar spondylosis   4. Chronic low back pain (Secondary source of pain) (Bilateral) (R>L)   5. Acute postoperative pain   6. Chronic low back pain (Location of Secondary source of pain) (Bilateral) (R>L)    Ms. Hernandes has either failed to respond, was unable to tolerate, or simply did not get enough benefit from other more conservative therapies including, but not limited to: 1. Over-the-counter medications 2. Anti-inflammatory medications 3. Muscle relaxants 4. Membrane stabilizers 5. Opioids 6. Physical therapy 7. Modalities (Heat, ice, etc.) 8. Invasive techniques such as nerve blocks. Ms. Mikel has attained more than 50% relief of the pain from a series of diagnostic injections conducted in separate occasions.  Pain Score: Pre-procedure: 0-No pain/10 Post-procedure: 0-No pain/10  Pre-op Assessment:  Ms. Bradfield is a 76 y.o. (year old), female  patient, seen today for interventional treatment. She  has a past surgical history that includes Abdominal hysterectomy; Coronary stent placement; Coronary artery bypass graft; and s/p pacer insertion. Ms. Joslin has a current medication list which includes the following prescription(s): aspirin ec, cholecalciferol, clonazepam, docusate sodium, fluticasone, gabapentin, levothyroxine, linaclotide, loratadine, multiple vitamins-minerals, nitroglycerin, olopatadine, omeprazole, oxycodone-acetaminophen, potassium chloride sa, rosuvastatin, sertraline, thiothixene, tramadol-acetaminophen, trazodone, vitamin b-12, and gabapentin. Her primarily concern today is the Leg Pain (back of both legs)  Initial Vital Signs: There were no vitals taken for this visit. BMI: Estimated body mass index is 24.21 kg/m as calculated from the following:   Height as of this encounter: 5\' 6"  (1.676 m).   Weight as of this encounter: 150 lb (68 kg).  Risk Assessment: Allergies: Reviewed. She is allergic to penicillin g.  Allergy Precautions: None required Coagulopathies: Reviewed. None identified.  Blood-thinner therapy: None at this time Active Infection(s): Reviewed. None identified. Ms. Wojtowicz is afebrile  Site Confirmation: Ms. Sanville was asked to confirm the procedure and laterality before marking the site Procedure checklist: Completed Consent: Before the procedure and under the influence of no sedative(s), amnesic(s), or anxiolytics, the patient was informed of the treatment options, risks and possible complications. To fulfill our ethical and legal obligations, as recommended by the American Medical Association's Code of Ethics, I have informed the patient of my clinical impression; the nature and purpose of the treatment or procedure; the risks, benefits, and possible complications of the intervention; the alternatives, including doing nothing; the risk(s) and benefit(s) of the alternative treatment(s) or  procedure(s); and the risk(s) and benefit(s) of doing nothing. The patient was provided information about the general risks and possible complications associated with the procedure. These may include, but are not limited to: failure to  achieve desired goals, infection, bleeding, organ or nerve damage, allergic reactions, paralysis, and death. In addition, the patient was informed of those risks and complications associated to Spine-related procedures, such as failure to decrease pain; infection (i.e.: Meningitis, epidural or intraspinal abscess); bleeding (i.e.: epidural hematoma, subarachnoid hemorrhage, or any other type of intraspinal or peri-dural bleeding); organ or nerve damage (i.e.: Any type of peripheral nerve, nerve root, or spinal cord injury) with subsequent damage to sensory, motor, and/or autonomic systems, resulting in permanent pain, numbness, and/or weakness of one or several areas of the body; allergic reactions; (i.e.: anaphylactic reaction); and/or death. Furthermore, the patient was informed of those risks and complications associated with the medications. These include, but are not limited to: allergic reactions (i.e.: anaphylactic or anaphylactoid reaction(s)); adrenal axis suppression; blood sugar elevation that in diabetics may result in ketoacidosis or comma; water retention that in patients with history of congestive heart failure may result in shortness of breath, pulmonary edema, and decompensation with resultant heart failure; weight gain; swelling or edema; medication-induced neural toxicity; particulate matter embolism and blood vessel occlusion with resultant organ, and/or nervous system infarction; and/or aseptic necrosis of one or more joints. Finally, the patient was informed that Medicine is not an exact science; therefore, there is also the possibility of unforeseen or unpredictable risks and/or possible complications that may result in a catastrophic outcome. The patient  indicated having understood very clearly. We have given the patient no guarantees and we have made no promises. Enough time was given to the patient to ask questions, all of which were answered to the patient's satisfaction. Ms. King has indicated that she wanted to continue with the procedure. Attestation: I, the ordering provider, attest that I have discussed with the patient the benefits, risks, side-effects, alternatives, likelihood of achieving goals, and potential problems during recovery for the procedure that I have provided informed consent. Date: 01/17/2017; Time: 8:04 AM  Pre-Procedure Preparation:  Monitoring: As per clinic protocol. Respiration, ETCO2, SpO2, BP, heart rate and rhythm monitor placed and checked for adequate function Safety Precautions: Patient was assessed for positional comfort and pressure points before starting the procedure. Time-out: I initiated and conducted the "Time-out" before starting the procedure, as per protocol. The patient was asked to participate by confirming the accuracy of the "Time Out" information. Verification of the correct person, site, and procedure were performed and confirmed by me, the nursing staff, and the patient. "Time-out" conducted as per Joint Commission's Universal Protocol (UP.01.01.01). "Time-out" Date & Time: 01/17/2017; 1418 hrs.  Description of Procedure Process:   Position: Prone Target Area: For Lumbar Facet blocks, the target is the groove formed by the junction of the transverse process and superior articular process. For the L5 dorsal ramus, the target is the notch between superior articular process and sacral ala. For the S1 dorsal ramus, the target is the superior and lateral edge of the posterior S1 Sacral foramen. Approach: Paraspinal approach. Area Prepped: Entire Posterior Lumbosacral Region Prepping solution: Hibiclens (4.0% Chlorhexidine gluconate solution) Safety Precautions: Aspiration looking for blood return was  conducted prior to all injections. At no point did we inject any substances, as a needle was being advanced. No attempts were made at seeking any paresthesias. Safe injection practices and needle disposal techniques used. Medications properly checked for expiration dates. SDV (single dose vial) medications used. Description of the Procedure: Protocol guidelines were followed. The patient was placed in position over the procedure table. The target area was identified and the area prepped in  the usual manner. The skin and muscle were infiltrated with local anesthetic. Appropriate amount of time allowed to pass for local anesthetics to take effect. Radiofrequency needles were introduced to the target area using fluoroscopic guidance. Using the NeuroTherm NT1100 Radiofrequency Generator, sensory stimulation using 50 Hz was used to locate & identify the nerve, making sure that the needle was positioned such that there was no sensory stimulation below 0.3 V or above 0.7 V. Stimulation using 2 Hz was used to evaluate the motor component. Care was taken not to lesion any nerves that demonstrated motor stimulation of the lower extremities at an output of less than 2.5 times that of the sensory threshold, or a maximum of 2.0 V. Once satisfactory placement of the needles was achieved, the numbing solution was slowly injected after negative aspiration. After waiting for at least 2 minutes, the ablation was performed at 80 degrees C for 60 seconds, using regular Radiofrequency settings. Once the procedure was completed, the needles were then removed and the area cleansed, making sure to leave some of the prepping solution back to take advantage of its long term bactericidal properties. Intra-operative Compliance: Compliant  Illustration of the posterior view of the lumbar spine and the posterior neural structures. Laminae of L2 through S1 are labeled. DPRL5, dorsal primary ramus of L5; DPRS1, dorsal primary ramus of S1;  DPR3, dorsal primary ramus of L3; FJ, facet (zygapophyseal) joint L3-L4; I, inferior articular process of L4; LB1, lateral branch of dorsal primary ramus of L1; IAB, inferior articular branches from L3 medial branch (supplies L4-L5 facet joint); IBP, intermediate branch plexus; MB3, medial branch of dorsal primary ramus of L3; NR3, third lumbar nerve root; S, superior articular process of L5; SAB, superior articular branches from L4 (supplies L4-5 facet joint also); TP3, transverse process of L3.  Vitals:   01/17/17 1451 01/17/17 1459 01/17/17 1509 01/17/17 1519  BP: 128/77 125/74 134/77 136/79  Pulse:      Resp: (!) 25 20 (!) 23 (!) 24  Temp:  98 F (36.7 C)    TempSrc:      SpO2: 100% 99% 99% 100%  Weight:      Height:        Start Time: 1418 hrs. End Time: 1449 hrs. Materials & Medications:  Needle(s) Type: Teflon-coated, curved tip, Radiofrequency needle(s) Gauge: 22G Length: 10cm Medication(s): We administered lactated ringers, midazolam, lidocaine, triamcinolone acetonide, and ropivacaine (PF) 2 mg/mL (0.2%). Please see chart orders for dosing details.  Imaging Guidance (Spinal):  Type of Imaging Technique: Fluoroscopy Guidance (Spinal) Indication(s): Assistance in needle guidance and placement for procedures requiring needle placement in or near specific anatomical locations not easily accessible without such assistance. Exposure Time: Please see nurses notes. Contrast: None used. Fluoroscopic Guidance: I was personally present during the use of fluoroscopy. "Tunnel Vision Technique" used to obtain the best possible view of the target area. Parallax error corrected before commencing the procedure. "Direction-depth-direction" technique used to introduce the needle under continuous pulsed fluoroscopy. Once target was reached, antero-posterior, oblique, and lateral fluoroscopic projection used confirm needle placement in all planes. Images permanently stored in EMR. Interpretation: No  contrast injected. I personally interpreted the imaging intraoperatively. Adequate needle placement confirmed in multiple planes. Permanent images saved into the patient's record.  Antibiotic Prophylaxis:  Indication(s): None identified Antibiotic given: None  Post-operative Assessment:  EBL: None Complications: No immediate post-treatment complications observed by team, or reported by patient. Note: The patient tolerated the entire procedure well. A repeat set of vitals  were taken after the procedure and the patient was kept under observation following institutional policy, for this type of procedure. Post-procedural neurological assessment was performed, showing return to baseline, prior to discharge. The patient was provided with post-procedure discharge instructions, including a section on how to identify potential problems. Should any problems arise concerning this procedure, the patient was given instructions to immediately contact us, at any time, without hesitation. In any case, we plan to contact the patient by telephone for a follow-up status report regarding this interventional procedure. Comments:  No additional relevant information.  Plan of Care    Imaging Orders     DG C-Arm 1-60 Min-No Report  Procedure Orders     Radiofrequency,Lumbar     Radiofrequency,Lumbar  Medications ordered for procedure: Meds ordered this encounter  Medications  . lactated ringers infusion 1,000 mL  . midazolam (VERSED) 5 MG/5ML injection 1-2 mg    Make sure Flumazenil is available in the pyxis when using this medication. If oversedation occurs, administer 0.2 mg IV over 15 sec. If after 45 sec no response, administer 0.2 mg again over 1 min; may repeat at 1 min intervals; not to exceed 4 doses (1 mg)  . fentaNYL (SUBLIMAZE) injection 25-50 mcg    Make sure Narcan is available in the pyxis when using this medication. In the event of respiratory depression (RR< 8/min): Titrate NARCAN (naloxone)  in increments of 0.1 to 0.2 mg IV at 2-3 minute intervals, until desired degree of reversal.  . lidocaine (XYLOCAINE) 2 % (with pres) injection 200 mg  . triamcinolone acetonide (KENALOG-40) injection 40 mg  . ropivacaine (PF) 2 mg/mL (0.2%) (NAROPIN) injection 9 mL  . oxyCODONE-acetaminophen (PERCOCET) 5-325 MG tablet    Sig: Take 1 tablet by mouth every 6 (six) hours as needed for severe pain.    Dispense:  28 tablet    Refill:  0    For acute post-operative pain. Not to be refilled. To last 7 days.   Medications administered: We administered lactated ringers, midazolam, lidocaine, triamcinolone acetonide, and ropivacaine (PF) 2 mg/mL (0.2%).  See the medical record for exact dosing, route, and time of administration.  New Prescriptions   OXYCODONE-ACETAMINOPHEN (PERCOCET) 5-325 MG TABLET    Take 1 tablet by mouth every 6 (six) hours as needed for severe pain.   Disposition: Discharge home  Discharge Date & Time: 01/17/2017; 1521 hrs.   Physician-requested Follow-up: Return for contralateral RFA in about 2-weeks: (L) L-FCT RFA. Future Appointments Date Time Provider [place]  02/28/2017 10:30 AM Milinda Pointer, MD [facility] None   Primary Care Physician: Lorelee Market, MD Location: [facility] Note by: Gaspar Cola, MD Date: 01/17/2017; Time: 3:02 PM  Disclaimer:  Medicine is not an Chief Strategy Officer. The only guarantee in medicine is that nothing is guaranteed. It is important to note that the decision to proceed with this intervention was based on the information collected from the patient. The Data and conclusions were drawn from the patient's questionnaire, the interview, and the physical examination. Because the information was provided in large part by the patient, it cannot be guaranteed that it has not been purposely or unconsciously manipulated. Every effort has been made to obtain as much relevant data as possible for this  evaluation. It is important to note that the conclusions that lead to this procedure are derived in large part from the available data. Always take into account that the treatment will also be dependent on availability of resources  and existing treatment guidelines, considered by other Pain Management Practitioners as being common knowledge and practice, at the time of the intervention. For Medico-Legal purposes, it is also important to point out that variation in procedural techniques and pharmacological choices are the acceptable norm. The indications, contraindications, technique, and results of the above procedure should only be interpreted and judged by a Board-Certified Interventional Pain Specialist with extensive familiarity and expertise in the same exact procedure and technique.

## 2017-01-18 ENCOUNTER — Telehealth: Payer: Self-pay | Admitting: *Deleted

## 2017-01-18 NOTE — Telephone Encounter (Signed)
Voicemail left with patient to call our office if there are questions or concerns re; procedure on yesterday.  

## 2017-01-24 DIAGNOSIS — F03A0 Unspecified dementia, mild, without behavioral disturbance, psychotic disturbance, mood disturbance, and anxiety: Secondary | ICD-10-CM | POA: Insufficient documentation

## 2017-01-24 DIAGNOSIS — F0390 Unspecified dementia without behavioral disturbance: Secondary | ICD-10-CM | POA: Insufficient documentation

## 2017-01-27 ENCOUNTER — Telehealth: Payer: Self-pay | Admitting: Pain Medicine

## 2017-01-27 NOTE — Telephone Encounter (Signed)
Patient lvmail at 4:05 01-26-17 asking if something could be called in for leg cramps

## 2017-01-27 NOTE — Telephone Encounter (Signed)
Called patient and she states she is having some cramps in both of her legs.  Denies redness or heat.  Denies being diabetic.  States she drinks gatoraid occasionally.  Encouraged patient to drink a gatoraid with every meal.  Instructed patient to call back for any other questions or concerns.

## 2017-01-30 ENCOUNTER — Other Ambulatory Visit: Payer: Self-pay

## 2017-01-31 ENCOUNTER — Ambulatory Visit (INDEPENDENT_AMBULATORY_CARE_PROVIDER_SITE_OTHER): Payer: Medicare Other

## 2017-01-31 ENCOUNTER — Encounter: Payer: Self-pay | Admitting: Podiatry

## 2017-01-31 ENCOUNTER — Ambulatory Visit (INDEPENDENT_AMBULATORY_CARE_PROVIDER_SITE_OTHER): Payer: Medicare Other | Admitting: Podiatry

## 2017-01-31 ENCOUNTER — Ambulatory Visit: Payer: Medicare Other | Admitting: Pain Medicine

## 2017-01-31 DIAGNOSIS — M7751 Other enthesopathy of right foot: Secondary | ICD-10-CM

## 2017-01-31 DIAGNOSIS — G5790 Unspecified mononeuropathy of unspecified lower limb: Secondary | ICD-10-CM | POA: Diagnosis not present

## 2017-01-31 DIAGNOSIS — L989 Disorder of the skin and subcutaneous tissue, unspecified: Secondary | ICD-10-CM | POA: Diagnosis not present

## 2017-01-31 DIAGNOSIS — M722 Plantar fascial fibromatosis: Secondary | ICD-10-CM

## 2017-01-31 MED ORDER — NONFORMULARY OR COMPOUNDED ITEM
2 refills | Status: DC
Start: 2017-01-31 — End: 2017-04-18

## 2017-01-31 NOTE — Progress Notes (Signed)
of

## 2017-02-02 NOTE — Progress Notes (Signed)
   Subjective: 76 year old female presents to the office today as a new patient for chief complaint of painful callus lesions of the right forefoot that have been present for about six months. She also reports pain to bilateral heels that she describes as paresthesias and shooting pain that worsens at night. Pt also reports bilateral hammertoes. She has not done anything to treat the symptoms. Patient presents today for further treatment and evaluation.   Past Medical History:  Diagnosis Date  . Anxiety   . Broken arm    left FA 3/17  . Broken arm    left  . CAD (coronary artery disease)    s/p MI  . Depression   . Hyperlipidemia   . Hypertension   . Hypothyroid   . Neck pain 06/04/2014  . Spinal stenosis      Objective:  Physical Exam General: Alert and oriented x3 in no acute distress  Dermatology: Hyperkeratotic lesion present on the right forefoot. Pain on palpation with a central nucleated core noted.  Skin is warm, dry and supple bilateral lower extremities. Negative for open lesions or macerations.  Vascular: Palpable pedal pulses bilaterally. No edema or erythema noted. Capillary refill within normal limits.  Neurological: Epicritic and protective threshold grossly intact bilaterally.   Musculoskeletal Exam: Pain on palpation at the keratotic lesion noted. Pain with palpation to the 3rd MPJ of the right foot also noted. Range of motion within normal limits bilateral. Muscle strength 5/5 in all groups bilateral.  Radiographic Exam: Normal osseous mineralization. Joint spaces preserved. No fracture/dislocation/boney destruction.     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 X-Rays reviewed. #2 Excisional debridement of keratoic lesion using a chisel blade was performed without incident.  #3 Dressed area with light dressing. #4 Injection of 0.5 mLs Celestone  Soluspan injected into the 3rd MPJ of the right foot. #5 Prescription for peripheral neuropathy pain cream to be dispensed from [facility]. #6 Patient is to return to the clinic in 8 weeks.   Edrick Kins, DPM [facility]  Dr. Edrick Kins, [place]                                        [place], [place] [address]                Office [phone]  Fax [phone]

## 2017-02-28 ENCOUNTER — Ambulatory Visit: Payer: Medicare Other | Admitting: Pain Medicine

## 2017-03-06 ENCOUNTER — Encounter: Payer: Self-pay | Admitting: Student in an Organized Health Care Education/Training Program

## 2017-03-06 ENCOUNTER — Other Ambulatory Visit: Payer: Self-pay

## 2017-03-06 ENCOUNTER — Ambulatory Visit (HOSPITAL_BASED_OUTPATIENT_CLINIC_OR_DEPARTMENT_OTHER): Payer: Medicare Other | Admitting: Student in an Organized Health Care Education/Training Program

## 2017-03-06 ENCOUNTER — Ambulatory Visit
Admission: RE | Admit: 2017-03-06 | Discharge: 2017-03-06 | Disposition: A | Discharge status: Home / Self Care | Payer: Medicare Other | Source: Ambulatory Visit | Attending: Student in an Organized Health Care Education/Training Program | Admitting: Student in an Organized Health Care Education/Training Program

## 2017-03-06 VITALS — BP 130/74 | HR 61 | Temp 98.2°F | Resp 25 | Ht 67.0 in | Wt 145.0 lb

## 2017-03-06 DIAGNOSIS — Z951 Presence of aortocoronary bypass graft: Secondary | ICD-10-CM | POA: Insufficient documentation

## 2017-03-06 DIAGNOSIS — M79605 Pain in left leg: Secondary | ICD-10-CM | POA: Diagnosis not present

## 2017-03-06 DIAGNOSIS — Z7902 Long term (current) use of antithrombotics/antiplatelets: Secondary | ICD-10-CM | POA: Diagnosis not present

## 2017-03-06 DIAGNOSIS — Z880 Allergy status to penicillin: Secondary | ICD-10-CM | POA: Insufficient documentation

## 2017-03-06 DIAGNOSIS — Z9889 Other specified postprocedural states: Secondary | ICD-10-CM | POA: Diagnosis not present

## 2017-03-06 DIAGNOSIS — M47816 Spondylosis without myelopathy or radiculopathy, lumbar region: Secondary | ICD-10-CM | POA: Diagnosis not present

## 2017-03-06 DIAGNOSIS — M549 Dorsalgia, unspecified: Secondary | ICD-10-CM | POA: Diagnosis not present

## 2017-03-06 DIAGNOSIS — Z950 Presence of cardiac pacemaker: Secondary | ICD-10-CM | POA: Insufficient documentation

## 2017-03-06 DIAGNOSIS — M47896 Other spondylosis, lumbar region: Secondary | ICD-10-CM | POA: Diagnosis present

## 2017-03-06 DIAGNOSIS — Z955 Presence of coronary angioplasty implant and graft: Secondary | ICD-10-CM | POA: Diagnosis not present

## 2017-03-06 DIAGNOSIS — M4726 Other spondylosis with radiculopathy, lumbar region: Secondary | ICD-10-CM

## 2017-03-06 DIAGNOSIS — M4696 Unspecified inflammatory spondylopathy, lumbar region: Secondary | ICD-10-CM

## 2017-03-06 MED ORDER — ROPIVACAINE HCL 2 MG/ML IJ SOLN
9.0000 mL | Freq: Once | INTRAMUSCULAR | Status: AC
  Administered 2017-03-06: 10 mL via PERINEURAL
  Filled 2017-03-06: qty 10

## 2017-03-06 MED ORDER — TIZANIDINE HCL 2 MG PO TABS: 2.0000 mg | ORAL_TABLET | Freq: Every evening | ORAL | 0 refills | Status: DC | PRN

## 2017-03-06 MED ORDER — FENTANYL CITRATE (PF) 100 MCG/2ML IJ SOLN
25.0000 ug | INTRAMUSCULAR | Status: DC | PRN
  Administered 2017-03-06: 50 ug via INTRAVENOUS
  Filled 2017-03-06: qty 2

## 2017-03-06 MED ORDER — LIDOCAINE HCL (PF) 1 % IJ SOLN
10.0000 mL | Freq: Once | INTRAMUSCULAR | Status: AC
  Administered 2017-03-06: 5 mL
  Filled 2017-03-06: qty 10

## 2017-03-06 MED ORDER — TRIAMCINOLONE ACETONIDE 40 MG/ML IJ SUSP
40.0000 mg | Freq: Once | INTRAMUSCULAR | Status: AC
  Administered 2017-03-06: 40 mg
  Filled 2017-03-06: qty 1

## 2017-03-06 NOTE — Progress Notes (Signed)
Patient's Name: Kristi [name]  MRN: [id number]  Referring Provider: Lorelee Market, MD  DOB: [birth year]  PCP: Lorelee Market, MD  DOS: 03/06/2017  Note by: Gillis Santa, MD  Service setting: Ambulatory outpatient  Specialty: Interventional Pain Management  Patient type: Established  Location: [facility] (AMB) Pain Management Facility  Visit type: Interventional Procedure   Primary Reason for Visit: Interventional Pain Management Treatment. CC: Leg Pain (bilateral) and Back Pain (mid left)  Procedure:  Anesthesia, Analgesia, Anxiolysis:  Type: Therapeutic Medial Branch Facet Radiofrequency Ablation Region: Lumbar Level: L2, L3, L4, L5, & S1 Medial Branch Level(s) Laterality: Left-Sided  Type: Local Anesthesia with Moderate (Conscious) Sedation Local Anesthetic: Lidocaine 1% Route: Intravenous (IV) IV Access: Secured Sedation: Meaningful verbal contact was maintained at all times during the procedure  Indication(s): Analgesia and Anxiety   Indications: 1. Lumbar spondylosis   2. Lumbar facet osteoarthritis    Ms. Kabat has either failed to respond, was unable to tolerate, or simply did not get enough benefit from other more conservative therapies including, but not limited to: 1. Over-the-counter medications 2. Anti-inflammatory medications 3. Muscle relaxants 4. Membrane stabilizers 5. Opioids 6. Physical therapy 7. Modalities (Heat, ice, etc.) 8. Invasive techniques such as nerve blocks. Ms. Lasecki has attained more than 50% relief of the pain from a series of diagnostic injections conducted in separate occasions.  Pain Score: Pre-procedure: 8 /10 Post-procedure: 0-No pain/10  Pre-op Assessment:  Ms. Heenan is a 76 y.o. (year old), female patient, seen today for interventional treatment. She  has a past surgical history that includes Abdominal hysterectomy; Coronary stent placement; Coronary artery bypass graft; and s/p pacer insertion. Ms. Kerce has a current  medication list which includes the following prescription(s): aspirin ec, carvedilol, cholecalciferol, clonazepam, docusate sodium, fluticasone, furosemide, lamotrigine, levothyroxine, linaclotide, loratadine, multiple vitamins-minerals, nitroglycerin, olopatadine, omeprazole, potassium chloride sa, rosuvastatin, sertraline, thiothixene, tramadol-acetaminophen, trazodone, vitamin b-12, clopidogrel, gabapentin, gabapentin, gabapentin, NONFORMULARY OR COMPOUNDED ITEM, olopatadine hcl, and tizanidine, and the following Facility-Administered Medications: fentanyl. Her primarily concern today is the Leg Pain (bilateral) and Back Pain (mid left)  Initial Vital Signs: There were no vitals taken for this visit. BMI: Estimated body mass index is 22.71 kg/m as calculated from the following:   Height as of this encounter: 5\' 7"  (1.702 m).   Weight as of this encounter: 145 lb (65.8 kg).  Risk Assessment: Allergies: Reviewed. She is allergic to penicillins and penicillin g.  Allergy Precautions: None required Coagulopathies: Reviewed. None identified.  Blood-thinner therapy: None at this time Active Infection(s): Reviewed. None identified. Ms. Macias is afebrile  Site Confirmation: Ms. Nowling was asked to confirm the procedure and laterality before marking the site Procedure checklist: Completed Consent: Before the procedure and under the influence of no sedative(s), amnesic(s), or anxiolytics, the patient was informed of the treatment options, risks and possible complications. To fulfill our ethical and legal obligations, as recommended by the American Medical Association's Code of Ethics, I have informed the patient of my clinical impression; the nature and purpose of the treatment or procedure; the risks, benefits, and possible complications of the intervention; the alternatives, including doing nothing; the risk(s) and benefit(s) of the alternative treatment(s) or procedure(s); and the risk(s) and  benefit(s) of doing nothing. The patient was provided information about the general risks and possible complications associated with the procedure. These may include, but are not limited to: failure to achieve desired goals, infection, bleeding, organ or nerve damage, allergic reactions, paralysis, and death. In addition, the patient was informed  of those risks and complications associated to Spine-related procedures, such as failure to decrease pain; infection (i.e.: Meningitis, epidural or intraspinal abscess); bleeding (i.e.: epidural hematoma, subarachnoid hemorrhage, or any other type of intraspinal or peri-dural bleeding); organ or nerve damage (i.e.: Any type of peripheral nerve, nerve root, or spinal cord injury) with subsequent damage to sensory, motor, and/or autonomic systems, resulting in permanent pain, numbness, and/or weakness of one or several areas of the body; allergic reactions; (i.e.: anaphylactic reaction); and/or death. Furthermore, the patient was informed of those risks and complications associated with the medications. These include, but are not limited to: allergic reactions (i.e.: anaphylactic or anaphylactoid reaction(s)); adrenal axis suppression; blood sugar elevation that in diabetics may result in ketoacidosis or comma; water retention that in patients with history of congestive heart failure may result in shortness of breath, pulmonary edema, and decompensation with resultant heart failure; weight gain; swelling or edema; medication-induced neural toxicity; particulate matter embolism and blood vessel occlusion with resultant organ, and/or nervous system infarction; and/or aseptic necrosis of one or more joints. Finally, the patient was informed that Medicine is not an exact science; therefore, there is also the possibility of unforeseen or unpredictable risks and/or possible complications that may result in a catastrophic outcome. The patient indicated having understood very  clearly. We have given the patient no guarantees and we have made no promises. Enough time was given to the patient to ask questions, all of which were answered to the patient's satisfaction. Ms. Helvie has indicated that she wanted to continue with the procedure. Attestation: I, the ordering provider, attest that I have discussed with the patient the benefits, risks, side-effects, alternatives, likelihood of achieving goals, and potential problems during recovery for the procedure that I have provided informed consent. Date: 03/06/2017; Time: 8:04 AM  Pre-Procedure Preparation:  Monitoring: As per clinic protocol. Respiration, ETCO2, SpO2, BP, heart rate and rhythm monitor placed and checked for adequate function Safety Precautions: Patient was assessed for positional comfort and pressure points before starting the procedure. Time-out: I initiated and conducted the "Time-out" before starting the procedure, as per protocol. The patient was asked to participate by confirming the accuracy of the "Time Out" information. Verification of the correct person, site, and procedure were performed and confirmed by me, the nursing staff, and the patient. "Time-out" conducted as per Joint Commission's Universal Protocol (UP.01.01.01). "Time-out" Date & Time: 03/06/2017; 0835 hrs.  Description of Procedure Process:   Position: Prone Target Area: For Lumbar Facet blocks, the target is the groove formed by the junction of the transverse process and superior articular process. For the L5 dorsal ramus, the target is the notch between superior articular process and sacral ala. For the S1 dorsal ramus, the target is the superior and lateral edge of the posterior S1 Sacral foramen. Approach: Paraspinal approach. Area Prepped: Entire Posterior Lumbosacral Region Prepping solution: Hibiclens (4.0% Chlorhexidine gluconate solution) Safety Precautions: Aspiration looking for blood return was conducted prior to all  injections. At no point did we inject any substances, as a needle was being advanced. No attempts were made at seeking any paresthesias. Safe injection practices and needle disposal techniques used. Medications properly checked for expiration dates. SDV (single dose vial) medications used. Description of the Procedure: Protocol guidelines were followed. The patient was placed in position over the procedure table. The target area was identified and the area prepped in the usual manner. The skin and muscle were infiltrated with local anesthetic. Appropriate amount of time allowed to pass for  local anesthetics to take effect. Radiofrequency needles were introduced to the target area using fluoroscopic guidance. Using the NeuroTherm NT1100 Radiofrequency Generator, sensory stimulation using 50 Hz was used to locate & identify the nerve, making sure that the needle was positioned such that there was no sensory stimulation below 0.3 V or above 0.7 V. Stimulation using 2 Hz was used to evaluate the motor component. Care was taken not to lesion any nerves that demonstrated motor stimulation of the lower extremities at an output of less than 2.5 times that of the sensory threshold, or a maximum of 2.0 V. Once satisfactory placement of the needles was achieved, the numbing solution was slowly injected after negative aspiration. After waiting for at least 2 minutes, the ablation was performed at 80 degrees C for 60 seconds, using regular Radiofrequency settings. Once the procedure was completed, the needles were then removed and the area cleansed, making sure to leave some of the prepping solution back to take advantage of its long term bactericidal properties. Intra-operative Compliance: Compliant  Illustration of the posterior view of the lumbar spine and the posterior neural structures. Laminae of L2 through S1 are labeled. DPRL5, dorsal primary ramus of L5; DPRS1, dorsal primary ramus of S1; DPR3, dorsal primary ramus  of L3; FJ, facet (zygapophyseal) joint L3-L4; I, inferior articular process of L4; LB1, lateral branch of dorsal primary ramus of L1; IAB, inferior articular branches from L3 medial branch (supplies L4-L5 facet joint); IBP, intermediate branch plexus; MB3, medial branch of dorsal primary ramus of L3; NR3, third lumbar nerve root; S, superior articular process of L5; SAB, superior articular branches from L4 (supplies L4-5 facet joint also); TP3, transverse process of L3.  Vitals:   03/06/17 0939 03/06/17 0948 03/06/17 0958 03/06/17 1008  BP: 116/71 132/77 (!) 137/121 130/74  Pulse: 61     Resp: 16 (!) 24 (!) 22 (!) 25  Temp:  98.2 F (36.8 C)    TempSrc:  Tympanic    SpO2: 100% 100% 99% 98%  Weight:      Height:        Start Time: 0835 hrs. End Time: 0937 hrs. Materials & Medications:  Needle(s) Type: Teflon-coated, curved tip, Radiofrequency needle(s) Gauge: 22G Length: 10cm Medication(s): We administered fentaNYL, ropivacaine (PF) 2 mg/mL (0.2%), triamcinolone acetonide, and lidocaine (PF). Please see chart orders for dosing details.  Imaging Guidance (Spinal):  Type of Imaging Technique: Fluoroscopy Guidance (Spinal) Indication(s): Assistance in needle guidance and placement for procedures requiring needle placement in or near specific anatomical locations not easily accessible without such assistance. Exposure Time: Please see nurses notes. Contrast: None used. Fluoroscopic Guidance: I was personally present during the use of fluoroscopy. "Tunnel Vision Technique" used to obtain the best possible view of the target area. Parallax error corrected before commencing the procedure. "Direction-depth-direction" technique used to introduce the needle under continuous pulsed fluoroscopy. Once target was reached, antero-posterior, oblique, and lateral fluoroscopic projection used confirm needle placement in all planes. Images permanently stored in EMR. Interpretation: No contrast injected. I  personally interpreted the imaging intraoperatively. Adequate needle placement confirmed in multiple planes. Permanent images saved into the patient's record.  Antibiotic Prophylaxis:  Indication(s): None identified Antibiotic given: None  Post-operative Assessment:  EBL: None Complications: No immediate post-treatment complications observed by team, or reported by patient. Note: The patient tolerated the entire procedure well. A repeat set of vitals were taken after the procedure and the patient was kept under observation following institutional policy, for this type of procedure.  Post-procedural neurological assessment was performed, showing return to baseline, prior to discharge. The patient was provided with post-procedure discharge instructions, including a section on how to identify potential problems. Should any problems arise concerning this procedure, the patient was given instructions to immediately contact us, at any time, without hesitation. In any case, we plan to contact the patient by telephone for a follow-up status report regarding this interventional procedure. Comments:  No additional relevant information. 5 out of 5 strength bilateral lower extremity: Plantar flexion, dorsiflexion, knee flexion, knee extension. Plan of Care    Imaging Orders     DG C-Arm 1-60 Min-No Report Procedure Orders    No procedure(s) ordered today    Medications ordered for procedure: Meds ordered this encounter  Medications  . fentaNYL (SUBLIMAZE) injection 25-100 mcg    Make sure Narcan is available in the pyxis when using this medication. In the event of respiratory depression (RR< 8/min): Titrate NARCAN (naloxone) in increments of 0.1 to 0.2 mg IV at 2-3 minute intervals, until desired degree of reversal.  . ropivacaine (PF) 2 mg/mL (0.2%) (NAROPIN) injection 9 mL  . triamcinolone acetonide (KENALOG-40) injection 40 mg  . lidocaine (PF) (XYLOCAINE) 1 % injection 10 mL  . tiZANidine  (ZANAFLEX) 2 MG tablet    Sig: Take 1 tablet (2 mg total) by mouth at bedtime as needed for muscle spasms.    Dispense:  30 tablet    Refill:  0   Medications administered: We administered fentaNYL, ropivacaine (PF) 2 mg/mL (0.2%), triamcinolone acetonide, and lidocaine (PF).  See the medical record for exact dosing, route, and time of administration.  This SmartLink is deprecated. Use AVSMEDLIST instead to display the medication list for a patient. Disposition: Discharge home  Discharge Date & Time: 03/06/2017; 1010 hrs.   Physician-requested Follow-up: Return in about 4 weeks (around 04/03/2017) for Post Procedure Evaluation w Dr. Holley Raring. Future Appointments  Date Time Provider [place]  04/04/2017 11:30 AM Gillis Santa, MD [facility] None   Primary Care Physician: Lorelee Market, MD Location: [facility] Outpatient Pain Management Facility Note by: Gillis Santa, MD Date: 01/17/2017; Time: 3:02 PM  Disclaimer:  Medicine is not an exact science. The only guarantee in medicine is that nothing is guaranteed. It is important to note that the decision to proceed with this intervention was based on the information collected from the patient. The Data and conclusions were drawn from the patient's questionnaire, the interview, and the physical examination. Because the information was provided in large part by the patient, it cannot be guaranteed that it has not been purposely or unconsciously manipulated. Every effort has been made to obtain as much relevant data as possible for this evaluation. It is important to note that the conclusions that lead to this procedure are derived in large part from the available data. Always take into account that the treatment will also be dependent on availability of resources and existing treatment guidelines, considered by other Pain Management Practitioners as being common knowledge and practice, at the time of the intervention. For Medico-Legal purposes,  it is also important to point out that variation in procedural techniques and pharmacological choices are the acceptable norm. The indications, contraindications, technique, and results of the above procedure should only be interpreted and judged by a Board-Certified Interventional Pain Specialist with extensive familiarity and expertise in the same exact procedure and technique.

## 2017-03-06 NOTE — Patient Instructions (Addendum)
1. Rx for Tizanidine 2 mg as needed at bedtime for sedation  2. Discuss calf stretches for leg cramps  3. Follow up in 3 months with Dr. Holley Raring for evaluation  Calf stretches demonstrated and discussed with patient.  Also, fluid intake and the possible use of gatorade.  Patient states she has tried this and usually drinks approx 1 bottle of water per day.  Instructed to try to increase to 3 bottles per day and the rational of half our weight in ounces being a general rule of thumb for water intake per day.  Patient verbalizes u/o information and rational.  [facility]  Pain Management Discharge Instructions  General Discharge Instructions :  If you need to reach your doctor call: Monday-Friday 8:00 am - 4:00 pm at [phone] or toll free [phone].  After clinic hours [phone] to have operator reach doctor.  Bring all of your medication bottles to all your appointments in the pain clinic.  To cancel or reschedule your appointment with Pain Management please remember to call 24 hours in advance to avoid a fee.  Refer to the educational materials which you have been given on: General Risks, I had my Procedure. Discharge Instructions, Post Sedation.  Post Procedure Instructions:  The drugs you were given will stay in your system until tomorrow, so for the next 24 hours you should not drive, make any legal decisions or drink any alcoholic beverages.  You may eat anything you prefer, but it is better to start with liquids then soups and crackers, and gradually work up to solid foods.  Please notify your doctor immediately if you have any unusual bleeding, trouble breathing or pain that is not related to your normal pain.  Depending on the type of procedure that was done, some parts of your body may feel week and/or numb.  This usually clears up by tonight or the next day.  Walk with the use of an assistive device or accompanied by an adult for the 24 hours.  You may use  ice on the affected area for the first 24 hours.  Put ice in a Ziploc bag and cover with a towel and place against area 15 minutes on 15 minutes off.  You may switch to heat after 24 hours.

## 2017-03-06 NOTE — Progress Notes (Signed)
Safety precautions to be maintained throughout the outpatient stay will include: orient to surroundings, keep bed in low position, maintain call bell within reach at all times, provide assistance with transfer out of bed and ambulation.  

## 2017-03-07 ENCOUNTER — Telehealth: Payer: Self-pay | Admitting: *Deleted

## 2017-03-07 NOTE — Telephone Encounter (Signed)
No problems post procedure. 

## 2017-04-04 ENCOUNTER — Ambulatory Visit: Payer: Medicare Other | Admitting: Student in an Organized Health Care Education/Training Program

## 2017-04-04 ENCOUNTER — Ambulatory Visit (INDEPENDENT_AMBULATORY_CARE_PROVIDER_SITE_OTHER): Payer: Medicare Other | Admitting: Podiatry

## 2017-04-04 ENCOUNTER — Encounter: Payer: Self-pay | Admitting: Podiatry

## 2017-04-04 DIAGNOSIS — L84 Corns and callosities: Secondary | ICD-10-CM | POA: Diagnosis not present

## 2017-04-04 DIAGNOSIS — L600 Ingrowing nail: Secondary | ICD-10-CM

## 2017-04-04 MED ORDER — MUPIROCIN 2 % EX OINT: 1.0000 "application " | TOPICAL_OINTMENT | Freq: Two times a day (BID) | CUTANEOUS | 0 refills | Status: DC

## 2017-04-05 ENCOUNTER — Other Ambulatory Visit: Payer: Self-pay | Admitting: Student in an Organized Health Care Education/Training Program
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
## 2017-04-10 ENCOUNTER — Other Ambulatory Visit: Payer: Self-pay

## 2017-04-10 ENCOUNTER — Inpatient Hospital Stay
Admission: EM | Admit: 2017-04-10 | Discharge: 2017-04-18 | DRG: 315 | Disposition: A | Discharge status: Home Health | Payer: Medicare Other | Attending: Internal Medicine | Admitting: Internal Medicine

## 2017-04-10 DIAGNOSIS — F329 Major depressive disorder, single episode, unspecified: Secondary | ICD-10-CM | POA: Diagnosis present

## 2017-04-10 DIAGNOSIS — E785 Hyperlipidemia, unspecified: Secondary | ICD-10-CM | POA: Diagnosis present

## 2017-04-10 DIAGNOSIS — I2510 Atherosclerotic heart disease of native coronary artery without angina pectoris: Secondary | ICD-10-CM | POA: Diagnosis present

## 2017-04-10 DIAGNOSIS — K449 Diaphragmatic hernia without obstruction or gangrene: Secondary | ICD-10-CM | POA: Diagnosis present

## 2017-04-10 DIAGNOSIS — R1310 Dysphagia, unspecified: Secondary | ICD-10-CM

## 2017-04-10 DIAGNOSIS — I959 Hypotension, unspecified: Principal | ICD-10-CM | POA: Diagnosis present

## 2017-04-10 DIAGNOSIS — N3000 Acute cystitis without hematuria: Secondary | ICD-10-CM | POA: Diagnosis present

## 2017-04-10 DIAGNOSIS — E871 Hypo-osmolality and hyponatremia: Secondary | ICD-10-CM | POA: Diagnosis present

## 2017-04-10 DIAGNOSIS — K222 Esophageal obstruction: Secondary | ICD-10-CM

## 2017-04-10 DIAGNOSIS — E1165 Type 2 diabetes mellitus with hyperglycemia: Secondary | ICD-10-CM | POA: Diagnosis present

## 2017-04-10 DIAGNOSIS — I252 Old myocardial infarction: Secondary | ICD-10-CM

## 2017-04-10 DIAGNOSIS — Z880 Allergy status to penicillin: Secondary | ICD-10-CM

## 2017-04-10 DIAGNOSIS — Z9181 History of falling: Secondary | ICD-10-CM

## 2017-04-10 DIAGNOSIS — G20 Parkinson's disease: Secondary | ICD-10-CM | POA: Diagnosis present

## 2017-04-10 DIAGNOSIS — Z87892 Personal history of anaphylaxis: Secondary | ICD-10-CM

## 2017-04-10 DIAGNOSIS — Z7951 Long term (current) use of inhaled steroids: Secondary | ICD-10-CM

## 2017-04-10 DIAGNOSIS — R531 Weakness: Secondary | ICD-10-CM

## 2017-04-10 DIAGNOSIS — F419 Anxiety disorder, unspecified: Secondary | ICD-10-CM | POA: Diagnosis present

## 2017-04-10 DIAGNOSIS — R42 Dizziness and giddiness: Secondary | ICD-10-CM | POA: Diagnosis not present

## 2017-04-10 DIAGNOSIS — Z79890 Hormone replacement therapy (postmenopausal): Secondary | ICD-10-CM

## 2017-04-10 DIAGNOSIS — E1151 Type 2 diabetes mellitus with diabetic peripheral angiopathy without gangrene: Secondary | ICD-10-CM | POA: Diagnosis present

## 2017-04-10 DIAGNOSIS — B9620 Unspecified Escherichia coli [E. coli] as the cause of diseases classified elsewhere: Secondary | ICD-10-CM | POA: Diagnosis present

## 2017-04-10 DIAGNOSIS — E860 Dehydration: Secondary | ICD-10-CM | POA: Diagnosis present

## 2017-04-10 DIAGNOSIS — Z833 Family history of diabetes mellitus: Secondary | ICD-10-CM

## 2017-04-10 DIAGNOSIS — R55 Syncope and collapse: Secondary | ICD-10-CM

## 2017-04-10 DIAGNOSIS — R739 Hyperglycemia, unspecified: Secondary | ICD-10-CM

## 2017-04-10 DIAGNOSIS — Z8249 Family history of ischemic heart disease and other diseases of the circulatory system: Secondary | ICD-10-CM

## 2017-04-10 DIAGNOSIS — Z7982 Long term (current) use of aspirin: Secondary | ICD-10-CM

## 2017-04-10 DIAGNOSIS — Z818 Family history of other mental and behavioral disorders: Secondary | ICD-10-CM

## 2017-04-10 DIAGNOSIS — I10 Essential (primary) hypertension: Secondary | ICD-10-CM | POA: Diagnosis present

## 2017-04-10 DIAGNOSIS — Z7902 Long term (current) use of antithrombotics/antiplatelets: Secondary | ICD-10-CM

## 2017-04-10 DIAGNOSIS — Z90710 Acquired absence of both cervix and uterus: Secondary | ICD-10-CM

## 2017-04-10 DIAGNOSIS — Z951 Presence of aortocoronary bypass graft: Secondary | ICD-10-CM

## 2017-04-10 DIAGNOSIS — E039 Hypothyroidism, unspecified: Secondary | ICD-10-CM | POA: Diagnosis present

## 2017-04-10 DIAGNOSIS — E876 Hypokalemia: Secondary | ICD-10-CM | POA: Diagnosis present

## 2017-04-10 DIAGNOSIS — Z950 Presence of cardiac pacemaker: Secondary | ICD-10-CM

## 2017-04-10 DIAGNOSIS — Z955 Presence of coronary angioplasty implant and graft: Secondary | ICD-10-CM

## 2017-04-10 DIAGNOSIS — I248 Other forms of acute ischemic heart disease: Secondary | ICD-10-CM | POA: Diagnosis present

## 2017-04-10 HISTORY — DX: Parkinson's disease: G20

## 2017-04-10 HISTORY — DX: Parkinson's disease without dyskinesia, without mention of fluctuations: G20.A1

## 2017-04-10 LAB — BASIC METABOLIC PANEL
Anion gap: 12 (ref 5–15)
BUN: 19 mg/dL (ref 6–20)
CO2: 30 mmol/L (ref 22–32)
Calcium: 8.9 mg/dL (ref 8.9–10.3)
Chloride: 87 mmol/L — ABNORMAL LOW (ref 101–111)
Creatinine, Ser: 0.99 mg/dL (ref 0.44–1.00)
GFR calc Af Amer: >60 mL/min (ref >60)
GFR calc non Af Amer: 53 mL/min — ABNORMAL LOW (ref >60)
Glucose, Bld: 708 mg/dL (ref 65–99)
Potassium: 3.8 mmol/L (ref 3.5–5.1)
Sodium: 129 mmol/L — ABNORMAL LOW (ref 135–145)

## 2017-04-10 LAB — CBC
HCT: 37.9 % (ref 35.0–47.0)
Hemoglobin: 12.4 g/dL (ref 12.0–16.0)
MCH: 31.9 pg (ref 26.0–34.0)
MCHC: 32.7 g/dL (ref 32.0–36.0)
MCV: 97.5 fL (ref 80.0–100.0)
Platelets: 96 10*3/uL — ABNORMAL LOW (ref 150–440)
RBC: 3.89 MIL/uL (ref 3.80–5.20)
RDW: 14.9 % — ABNORMAL HIGH (ref 11.5–14.5)
WBC: 7.6 10*3/uL (ref 3.6–11.0)

## 2017-04-10 LAB — HEPATIC FUNCTION PANEL
ALT: 6 U/L — ABNORMAL LOW (ref 14–54)
AST: 49 U/L — ABNORMAL HIGH (ref 15–41)
Albumin: 3.4 g/dL — ABNORMAL LOW (ref 3.5–5.0)
Alkaline Phosphatase: 151 U/L — ABNORMAL HIGH (ref 38–126)
Bilirubin, Direct: 0.1 mg/dL (ref 0.1–0.5)
Indirect Bilirubin: 0.6 mg/dL (ref 0.3–0.9)
Total Bilirubin: 0.7 mg/dL (ref 0.3–1.2)
Total Protein: 6.7 g/dL (ref 6.5–8.1)

## 2017-04-10 LAB — GLUCOSE, CAPILLARY: Glucose-Capillary: >600 mg/dL (ref 65–99)

## 2017-04-10 LAB — TROPONIN I: Troponin I: 0.05 ng/mL (ref <0.03)

## 2017-04-10 NOTE — ED Notes (Signed)
Ems stated pt temp 99.6 and they gave 975mg  of tylenol

## 2017-04-10 NOTE — ED Notes (Addendum)
Pt reports bilateral eye pain x 1 month. Also reports legs have been weak and unsteady around the same amount of time. Pt states that is why she fell tonight. She also thinks her potassium is down. Reports being hospitalized recently and having hypokalemia at that time. Pt denies hx of diabetes but reports that "all of her family members" have it.   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

## 2017-04-10 NOTE — ED Triage Notes (Signed)
Pt brought via ems for c/o syncope and weakness - pt had syncope episode tonight and was found in the tub by her daughter and ems was called - pt c/o excessive thirst and urination - pt is not diabetic but cbg is reading "high"

## 2017-04-10 NOTE — ED Notes (Signed)
ED Provider at bedside. 

## 2017-04-11 ENCOUNTER — Encounter: Payer: Self-pay | Admitting: Emergency Medicine

## 2017-04-11 ENCOUNTER — Inpatient Hospital Stay
Admit: 2017-04-11 | Discharge: 2017-04-11 | Disposition: A | Discharge status: Home / Self Care | Payer: Medicare Other | Attending: Internal Medicine | Admitting: Internal Medicine

## 2017-04-11 ENCOUNTER — Other Ambulatory Visit: Payer: Self-pay

## 2017-04-11 ENCOUNTER — Emergency Department: Payer: Medicare Other

## 2017-04-11 DIAGNOSIS — E871 Hypo-osmolality and hyponatremia: Secondary | ICD-10-CM | POA: Diagnosis present

## 2017-04-11 DIAGNOSIS — F419 Anxiety disorder, unspecified: Secondary | ICD-10-CM | POA: Diagnosis present

## 2017-04-11 DIAGNOSIS — R1310 Dysphagia, unspecified: Secondary | ICD-10-CM | POA: Diagnosis not present

## 2017-04-11 DIAGNOSIS — K222 Esophageal obstruction: Secondary | ICD-10-CM | POA: Diagnosis not present

## 2017-04-11 DIAGNOSIS — Z79890 Hormone replacement therapy (postmenopausal): Secondary | ICD-10-CM | POA: Diagnosis not present

## 2017-04-11 DIAGNOSIS — B9620 Unspecified Escherichia coli [E. coli] as the cause of diseases classified elsewhere: Secondary | ICD-10-CM | POA: Diagnosis present

## 2017-04-11 DIAGNOSIS — Z90710 Acquired absence of both cervix and uterus: Secondary | ICD-10-CM | POA: Diagnosis not present

## 2017-04-11 DIAGNOSIS — E860 Dehydration: Secondary | ICD-10-CM | POA: Diagnosis present

## 2017-04-11 DIAGNOSIS — Z7951 Long term (current) use of inhaled steroids: Secondary | ICD-10-CM | POA: Diagnosis not present

## 2017-04-11 DIAGNOSIS — N3000 Acute cystitis without hematuria: Secondary | ICD-10-CM | POA: Diagnosis present

## 2017-04-11 DIAGNOSIS — Z833 Family history of diabetes mellitus: Secondary | ICD-10-CM | POA: Diagnosis not present

## 2017-04-11 DIAGNOSIS — R55 Syncope and collapse: Secondary | ICD-10-CM | POA: Diagnosis present

## 2017-04-11 DIAGNOSIS — I959 Hypotension, unspecified: Secondary | ICD-10-CM | POA: Diagnosis present

## 2017-04-11 DIAGNOSIS — Z880 Allergy status to penicillin: Secondary | ICD-10-CM | POA: Diagnosis not present

## 2017-04-11 DIAGNOSIS — E039 Hypothyroidism, unspecified: Secondary | ICD-10-CM | POA: Diagnosis present

## 2017-04-11 DIAGNOSIS — Z955 Presence of coronary angioplasty implant and graft: Secondary | ICD-10-CM | POA: Diagnosis not present

## 2017-04-11 DIAGNOSIS — I252 Old myocardial infarction: Secondary | ICD-10-CM | POA: Diagnosis not present

## 2017-04-11 DIAGNOSIS — Z951 Presence of aortocoronary bypass graft: Secondary | ICD-10-CM | POA: Diagnosis not present

## 2017-04-11 DIAGNOSIS — E876 Hypokalemia: Secondary | ICD-10-CM | POA: Diagnosis present

## 2017-04-11 DIAGNOSIS — I248 Other forms of acute ischemic heart disease: Secondary | ICD-10-CM | POA: Diagnosis present

## 2017-04-11 DIAGNOSIS — F329 Major depressive disorder, single episode, unspecified: Secondary | ICD-10-CM | POA: Diagnosis present

## 2017-04-11 DIAGNOSIS — R42 Dizziness and giddiness: Secondary | ICD-10-CM | POA: Diagnosis present

## 2017-04-11 DIAGNOSIS — Z950 Presence of cardiac pacemaker: Secondary | ICD-10-CM | POA: Diagnosis not present

## 2017-04-11 DIAGNOSIS — R933 Abnormal findings on diagnostic imaging of other parts of digestive tract: Secondary | ICD-10-CM | POA: Diagnosis not present

## 2017-04-11 DIAGNOSIS — Z7982 Long term (current) use of aspirin: Secondary | ICD-10-CM | POA: Diagnosis not present

## 2017-04-11 DIAGNOSIS — E1165 Type 2 diabetes mellitus with hyperglycemia: Secondary | ICD-10-CM | POA: Diagnosis present

## 2017-04-11 DIAGNOSIS — Z7902 Long term (current) use of antithrombotics/antiplatelets: Secondary | ICD-10-CM | POA: Diagnosis not present

## 2017-04-11 DIAGNOSIS — G20 Parkinson's disease: Secondary | ICD-10-CM | POA: Diagnosis present

## 2017-04-11 DIAGNOSIS — Z87892 Personal history of anaphylaxis: Secondary | ICD-10-CM | POA: Diagnosis not present

## 2017-04-11 LAB — TROPONIN I
Troponin I: 0.05 ng/mL (ref <0.03)
Troponin I: 0.05 ng/mL (ref <0.03)
Troponin I: 0.05 ng/mL (ref <0.03)
Troponin I: 0.05 ng/mL (ref <0.03)

## 2017-04-11 LAB — URINALYSIS, COMPLETE (UACMP) WITH MICROSCOPIC
Bacteria, UA: NONE SEEN
Bilirubin Urine: NEGATIVE
Glucose, UA: >=500 mg/dL — AB
Ketones, ur: NEGATIVE mg/dL
Nitrite: NEGATIVE
Protein, ur: NEGATIVE mg/dL
Specific Gravity, Urine: 1.023 (ref 1.005–1.030)
Squamous Epithelial / LPF: NONE SEEN
pH: 6 (ref 5.0–8.0)

## 2017-04-11 LAB — BASIC METABOLIC PANEL
Anion gap: 9 (ref 5–15)
BUN: 11 mg/dL (ref 6–20)
CO2: 25 mmol/L (ref 22–32)
Calcium: 7.9 mg/dL — ABNORMAL LOW (ref 8.9–10.3)
Chloride: 99 mmol/L — ABNORMAL LOW (ref 101–111)
Creatinine, Ser: 0.77 mg/dL (ref 0.44–1.00)
GFR calc Af Amer: >60 mL/min (ref >60)
GFR calc non Af Amer: >60 mL/min (ref >60)
Glucose, Bld: 248 mg/dL — ABNORMAL HIGH (ref 65–99)
Potassium: 3.1 mmol/L — ABNORMAL LOW (ref 3.5–5.1)
Sodium: 133 mmol/L — ABNORMAL LOW (ref 135–145)

## 2017-04-11 LAB — GLUCOSE, CAPILLARY
Glucose-Capillary: 593 mg/dL (ref 65–99)
Glucose-Capillary: 416 mg/dL — ABNORMAL HIGH (ref 65–99)
Glucose-Capillary: 183 mg/dL — ABNORMAL HIGH (ref 65–99)
Glucose-Capillary: 267 mg/dL — ABNORMAL HIGH (ref 65–99)
Glucose-Capillary: 229 mg/dL — ABNORMAL HIGH (ref 65–99)
Glucose-Capillary: 146 mg/dL — ABNORMAL HIGH (ref 65–99)
Glucose-Capillary: 136 mg/dL — ABNORMAL HIGH (ref 65–99)

## 2017-04-11 LAB — CBC
HCT: 33.8 % — ABNORMAL LOW (ref 35.0–47.0)
Hemoglobin: 11 g/dL — ABNORMAL LOW (ref 12.0–16.0)
MCH: 31.6 pg (ref 26.0–34.0)
MCHC: 32.7 g/dL (ref 32.0–36.0)
MCV: 96.7 fL (ref 80.0–100.0)
Platelets: 95 10*3/uL — ABNORMAL LOW (ref 150–440)
RBC: 3.5 MIL/uL — ABNORMAL LOW (ref 3.80–5.20)
RDW: 14.8 % — ABNORMAL HIGH (ref 11.5–14.5)
WBC: 6.7 10*3/uL (ref 3.6–11.0)

## 2017-04-11 LAB — ECHOCARDIOGRAM COMPLETE
Height: 67 in
Weight: 2566.4 oz

## 2017-04-11 MED ORDER — SODIUM CHLORIDE 0.9 % IV BOLUS (SEPSIS)
1000.0000 mL | Freq: Once | INTRAVENOUS | Status: AC
  Administered 2017-04-11: 1000 mL via INTRAVENOUS

## 2017-04-11 MED ORDER — ONDANSETRON HCL 4 MG/2ML IJ SOLN: 4.0000 mg | Freq: Four times a day (QID) | INTRAMUSCULAR | Status: DC | PRN

## 2017-04-11 MED ORDER — INSULIN ASPART 100 UNIT/ML ~~LOC~~ SOLN
0.0000 [IU] | Freq: Every day | SUBCUTANEOUS | Status: DC
  Administered 2017-04-13 – 2017-04-14 (×2): 2 [IU] via SUBCUTANEOUS
  Filled 2017-04-11: qty 1

## 2017-04-11 MED ORDER — OXYCODONE-ACETAMINOPHEN 5-325 MG PO TABS
1.0000 | ORAL_TABLET | Freq: Four times a day (QID) | ORAL | Status: DC | PRN
  Administered 2017-04-11 – 2017-04-16 (×2): 1 via ORAL
  Filled 2017-04-11 (×2): qty 1

## 2017-04-11 MED ORDER — CLONAZEPAM 0.5 MG PO TABS
0.5000 mg | ORAL_TABLET | Freq: Two times a day (BID) | ORAL | Status: DC
  Administered 2017-04-11: 0.5 mg via ORAL
  Filled 2017-04-11: qty 1

## 2017-04-11 MED ORDER — SERTRALINE HCL 50 MG PO TABS
100.0000 mg | ORAL_TABLET | Freq: Every day | ORAL | Status: DC
  Administered 2017-04-11 – 2017-04-18 (×8): 100 mg via ORAL
  Filled 2017-04-11 (×8): qty 2

## 2017-04-11 MED ORDER — MUPIROCIN 2 % EX OINT
1.0000 "application " | TOPICAL_OINTMENT | Freq: Two times a day (BID) | CUTANEOUS | Status: DC
  Administered 2017-04-11 – 2017-04-17 (×13): 1 via NASAL
  Filled 2017-04-11: qty 22

## 2017-04-11 MED ORDER — METFORMIN HCL 500 MG PO TABS
500.0000 mg | ORAL_TABLET | Freq: Two times a day (BID) | ORAL | Status: DC
  Administered 2017-04-11 – 2017-04-12 (×3): 500 mg via ORAL
  Filled 2017-04-11 (×3): qty 1

## 2017-04-11 MED ORDER — SODIUM CHLORIDE 0.9 % IV BOLUS (SEPSIS)
500.0000 mL | Freq: Once | INTRAVENOUS | Status: AC
  Administered 2017-04-11: 500 mL via INTRAVENOUS

## 2017-04-11 MED ORDER — LIVING WELL WITH DIABETES BOOK
Freq: Once | Status: AC
  Administered 2017-04-11: 15:00:00
  Filled 2017-04-11: qty 1

## 2017-04-11 MED ORDER — ACETAMINOPHEN 325 MG PO TABS
650.0000 mg | ORAL_TABLET | Freq: Four times a day (QID) | ORAL | Status: DC | PRN
  Administered 2017-04-16 (×2): 650 mg via ORAL
  Filled 2017-04-11 (×2): qty 2

## 2017-04-11 MED ORDER — SERTRALINE HCL 50 MG PO TABS
50.0000 mg | ORAL_TABLET | Freq: Every day | ORAL | Status: DC
  Administered 2017-04-11 – 2017-04-17 (×7): 50 mg via ORAL
  Filled 2017-04-11 (×8): qty 1

## 2017-04-11 MED ORDER — LAMOTRIGINE 100 MG PO TABS
100.0000 mg | ORAL_TABLET | Freq: Every day | ORAL | Status: DC
  Administered 2017-04-11: 100 mg via ORAL
  Filled 2017-04-11: qty 1

## 2017-04-11 MED ORDER — PANTOPRAZOLE SODIUM 40 MG PO TBEC
40.0000 mg | DELAYED_RELEASE_TABLET | Freq: Every day | ORAL | Status: DC
  Administered 2017-04-11 – 2017-04-18 (×8): 40 mg via ORAL
  Filled 2017-04-11 (×8): qty 1

## 2017-04-11 MED ORDER — VITAMIN B-12 1000 MCG PO TABS
500.0000 ug | ORAL_TABLET | Freq: Every day | ORAL | Status: DC
  Administered 2017-04-11 – 2017-04-18 (×8): 500 ug via ORAL
  Filled 2017-04-11 (×8): qty 1

## 2017-04-11 MED ORDER — SERTRALINE HCL 50 MG PO TABS: 50.0000 mg | ORAL_TABLET | Freq: Two times a day (BID) | ORAL | Status: DC

## 2017-04-11 MED ORDER — INSULIN ASPART 100 UNIT/ML ~~LOC~~ SOLN
10.0000 [IU] | Freq: Once | SUBCUTANEOUS | Status: AC
  Administered 2017-04-11: 10 [IU] via INTRAVENOUS
  Filled 2017-04-11: qty 1

## 2017-04-11 MED ORDER — DOCUSATE SODIUM 100 MG PO CAPS
100.0000 mg | ORAL_CAPSULE | Freq: Two times a day (BID) | ORAL | Status: DC
  Administered 2017-04-11 – 2017-04-15 (×6): 100 mg via ORAL
  Filled 2017-04-11 (×13): qty 1

## 2017-04-11 MED ORDER — INSULIN ASPART 100 UNIT/ML ~~LOC~~ SOLN
5.0000 [IU] | Freq: Once | SUBCUTANEOUS | Status: AC
  Administered 2017-04-11: 5 [IU] via INTRAVENOUS
  Filled 2017-04-11: qty 1

## 2017-04-11 MED ORDER — SODIUM CHLORIDE 0.9 % IV SOLN
INTRAVENOUS | Status: DC
  Administered 2017-04-11 – 2017-04-12 (×5): via INTRAVENOUS

## 2017-04-11 MED ORDER — SODIUM CHLORIDE 0.9% FLUSH
3.0000 mL | Freq: Two times a day (BID) | INTRAVENOUS | Status: DC
  Administered 2017-04-12 – 2017-04-17 (×11): 3 mL via INTRAVENOUS

## 2017-04-11 MED ORDER — TRAZODONE HCL 50 MG PO TABS
50.0000 mg | ORAL_TABLET | Freq: Every evening | ORAL | Status: DC | PRN
  Administered 2017-04-11: 50 mg via ORAL
  Filled 2017-04-11 (×2): qty 1

## 2017-04-11 MED ORDER — CLOPIDOGREL BISULFATE 75 MG PO TABS
75.0000 mg | ORAL_TABLET | Freq: Every day | ORAL | Status: DC
  Administered 2017-04-11 – 2017-04-13 (×3): 75 mg via ORAL
  Filled 2017-04-11 (×4): qty 1

## 2017-04-11 MED ORDER — LORATADINE 10 MG PO TABS
10.0000 mg | ORAL_TABLET | Freq: Every day | ORAL | Status: DC
  Administered 2017-04-11 – 2017-04-17 (×7): 10 mg via ORAL
  Filled 2017-04-11 (×7): qty 1

## 2017-04-11 MED ORDER — CARVEDILOL 3.125 MG PO TABS
3.1250 mg | ORAL_TABLET | Freq: Two times a day (BID) | ORAL | Status: DC
  Filled 2017-04-11: qty 1

## 2017-04-11 MED ORDER — ASPIRIN EC 81 MG PO TBEC
81.0000 mg | DELAYED_RELEASE_TABLET | Freq: Every day | ORAL | Status: DC
  Administered 2017-04-11 – 2017-04-13 (×3): 81 mg via ORAL
  Filled 2017-04-11 (×4): qty 1

## 2017-04-11 MED ORDER — LINACLOTIDE 145 MCG PO CAPS
145.0000 ug | ORAL_CAPSULE | Freq: Every day | ORAL | Status: DC
  Administered 2017-04-11 – 2017-04-18 (×6): 145 ug via ORAL
  Filled 2017-04-11 (×8): qty 1

## 2017-04-11 MED ORDER — ONDANSETRON HCL 4 MG PO TABS: 4.0000 mg | ORAL_TABLET | Freq: Four times a day (QID) | ORAL | Status: DC | PRN

## 2017-04-11 MED ORDER — ROSUVASTATIN CALCIUM 10 MG PO TABS
20.0000 mg | ORAL_TABLET | Freq: Every day | ORAL | Status: DC
  Administered 2017-04-11 – 2017-04-18 (×8): 20 mg via ORAL
  Filled 2017-04-11 (×9): qty 2

## 2017-04-11 MED ORDER — LEVOTHYROXINE SODIUM 50 MCG PO TABS
75.0000 ug | ORAL_TABLET | Freq: Every day | ORAL | Status: DC
  Administered 2017-04-12 – 2017-04-17 (×6): 75 ug via ORAL
  Filled 2017-04-11 (×6): qty 1

## 2017-04-11 MED ORDER — INSULIN ASPART 100 UNIT/ML ~~LOC~~ SOLN
3.0000 [IU] | Freq: Three times a day (TID) | SUBCUTANEOUS | Status: DC
  Administered 2017-04-11 – 2017-04-17 (×19): 3 [IU] via SUBCUTANEOUS
  Filled 2017-04-11 (×20): qty 1

## 2017-04-11 MED ORDER — ENOXAPARIN SODIUM 40 MG/0.4ML ~~LOC~~ SOLN
40.0000 mg | SUBCUTANEOUS | Status: DC
  Administered 2017-04-11 – 2017-04-17 (×6): 40 mg via SUBCUTANEOUS
  Filled 2017-04-11 (×6): qty 0.4

## 2017-04-11 MED ORDER — OLOPATADINE HCL 0.1 % OP SOLN
1.0000 [drp] | Freq: Two times a day (BID) | OPHTHALMIC | Status: DC
  Administered 2017-04-11 – 2017-04-18 (×14): 1 [drp] via OPHTHALMIC
  Filled 2017-04-11: qty 5

## 2017-04-11 MED ORDER — CARBIDOPA-LEVODOPA 25-100 MG PO TABS
1.0000 | ORAL_TABLET | Freq: Three times a day (TID) | ORAL | Status: DC
  Administered 2017-04-11 – 2017-04-18 (×22): 1 via ORAL
  Filled 2017-04-11 (×24): qty 1

## 2017-04-11 MED ORDER — POTASSIUM CHLORIDE CRYS ER 20 MEQ PO TBCR
40.0000 meq | EXTENDED_RELEASE_TABLET | Freq: Once | ORAL | Status: AC
  Administered 2017-04-11: 40 meq via ORAL
  Filled 2017-04-11: qty 2

## 2017-04-11 MED ORDER — SENNOSIDES-DOCUSATE SODIUM 8.6-50 MG PO TABS: 1.0000 | ORAL_TABLET | Freq: Every evening | ORAL | Status: DC | PRN

## 2017-04-11 MED ORDER — LEVOTHYROXINE SODIUM 100 MCG PO TABS
100.0000 ug | ORAL_TABLET | Freq: Every day | ORAL | Status: DC
  Administered 2017-04-11: 100 ug via ORAL
  Filled 2017-04-11 (×2): qty 1

## 2017-04-11 MED ORDER — VITAMIN D 1000 UNITS PO TABS
1000.0000 [IU] | ORAL_TABLET | Freq: Every day | ORAL | Status: DC
  Administered 2017-04-11 – 2017-04-17 (×7): 1000 [IU] via ORAL
  Filled 2017-04-11 (×7): qty 1

## 2017-04-11 MED ORDER — INSULIN ASPART 100 UNIT/ML ~~LOC~~ SOLN
0.0000 [IU] | Freq: Three times a day (TID) | SUBCUTANEOUS | Status: DC
  Administered 2017-04-11: 5 [IU] via SUBCUTANEOUS
  Administered 2017-04-11: 8 [IU] via SUBCUTANEOUS
  Administered 2017-04-11: 2 [IU] via SUBCUTANEOUS
  Administered 2017-04-12 (×2): 3 [IU] via SUBCUTANEOUS
  Administered 2017-04-13: 5 [IU] via SUBCUTANEOUS
  Administered 2017-04-13: 3 [IU] via SUBCUTANEOUS
  Administered 2017-04-13: 2 [IU] via SUBCUTANEOUS
  Administered 2017-04-14: 8 [IU] via SUBCUTANEOUS
  Administered 2017-04-14: 3 [IU] via SUBCUTANEOUS
  Administered 2017-04-15: 5 [IU] via SUBCUTANEOUS
  Administered 2017-04-15: 2 [IU] via SUBCUTANEOUS
  Administered 2017-04-16: 5 [IU] via SUBCUTANEOUS
  Administered 2017-04-16: 3 [IU] via SUBCUTANEOUS
  Administered 2017-04-17: 2 [IU] via SUBCUTANEOUS
  Administered 2017-04-17: 8 [IU] via SUBCUTANEOUS
  Administered 2017-04-17: 3 [IU] via SUBCUTANEOUS
  Filled 2017-04-11 (×17): qty 1

## 2017-04-11 MED ORDER — FLUTICASONE PROPIONATE 50 MCG/ACT NA SUSP
2.0000 | Freq: Every day | NASAL | Status: DC
  Administered 2017-04-11 – 2017-04-17 (×7): 2 via NASAL
  Filled 2017-04-11: qty 16

## 2017-04-11 MED ORDER — NITROGLYCERIN 0.4 MG SL SUBL: 0.4000 mg | SUBLINGUAL_TABLET | SUBLINGUAL | Status: DC | PRN

## 2017-04-11 MED ORDER — IOPAMIDOL (ISOVUE-300) INJECTION 61%
100.0000 mL | Freq: Once | INTRAVENOUS | Status: AC | PRN
  Administered 2017-04-11: 100 mL via INTRAVENOUS

## 2017-04-11 MED ORDER — ACETAMINOPHEN 650 MG RE SUPP: 650.0000 mg | Freq: Four times a day (QID) | RECTAL | Status: DC | PRN

## 2017-04-11 NOTE — H&P (Signed)
[facility] [place] NAME: Kristi [name]    MR#:  [id number]  DATE OF BIRTH:  April 23, 1940  DATE OF ADMISSION:  04/10/2017  PRIMARY CARE PHYSICIAN: Lorelee Market, MD   REQUESTING/REFERRING PHYSICIAN:   CHIEF COMPLAINT:   Chief Complaint  Patient presents with  . Loss of Consciousness  . Weakness    HISTORY OF PRESENT ILLNESS: Kristi [name]  is a 77 y.o. female with a known history of anxiety disorder, coronary artery disease, hyperlipidemia, hypertension, Parkinson's disease, spinal stenosis presented to the emergency room with generalized weakness.  Patient was trying to get up from the commode she lost balance and fell down.  She exactly does not remember whether she passed out at the same time.  Her daughter was available at home and she called the EMS and patient was brought to the emergency room.  She has high blood sugar but does not have a history of diabetes mellitus.  Has increased frequency of thirst and urination.  No complaints of any chest pain, shortness of breath.  No history of head injury.  She was worked up with CT head which showed no acute abnormality, CT abdomen which showed hemangioma of the liver.  Hospitalist service was consulted for further care.  PAST MEDICAL HISTORY:   Past Medical History:  Diagnosis Date  . Anxiety   . Broken arm    left FA 3/17  . Broken arm    left  . CAD (coronary artery disease)    s/p MI  . Depression   . Hyperlipidemia   . Hypertension   . Hypothyroid   . Neck pain 06/04/2014  . Parkinson's disease ([place])   . Spinal stenosis     PAST SURGICAL HISTORY:  Past Surgical History:  Procedure Laterality Date  . ABDOMINAL HYSTERECTOMY    . CORONARY ARTERY BYPASS GRAFT    . CORONARY STENT PLACEMENT    . s/p pacer insertion      SOCIAL HISTORY:  Social History   Tobacco Use  . Smoking status: Never Smoker  . Smokeless tobacco: Never Used  Substance Use Topics  . Alcohol  use: No    FAMILY HISTORY:  Family History  Problem Relation Age of Onset  . Cancer Mother   . Depression Mother   . Diabetes Mother   . Hypertension Mother   . Heart disease Father     DRUG ALLERGIES:  Allergies  Allergen Reactions  . Penicillins Anaphylaxis  . Penicillin G Swelling and Rash    Has patient had a PCN reaction causing immediate rash, facial/tongue/throat swelling, SOB or lightheadedness with hypotension: Yes Has patient had a PCN reaction causing severe rash involving mucus membranes or skin necrosis: No Has patient had a PCN reaction that required hospitalization: No Has patient had a PCN reaction occurring within the last 10 years: No If all of the above answers are "NO", then may proceed with Cephalosporin use.     REVIEW OF SYSTEMS:   CONSTITUTIONAL: No fever, has weakness.  EYES: No blurred or double vision.  EARS, NOSE, AND THROAT: No tinnitus or ear pain.  RESPIRATORY: No cough, shortness of breath, wheezing or hemoptysis.  CARDIOVASCULAR: No chest pain, orthopnea, edema.  GASTROINTESTINAL: No nausea, vomiting, diarrhea or abdominal pain.  GENITOURINARY: No dysuria, hematuria.  ENDOCRINE: Has polyuria, nocturia,  HEMATOLOGY: No anemia, easy bruising or bleeding SKIN: No rash or lesion. MUSCULOSKELETAL: No joint pain or arthritis.   NEUROLOGIC: No tingling, numbness,  weakness.  PSYCHIATRY: No anxiety or depression.   MEDICATIONS AT HOME:  Prior to Admission medications   Medication Sig Start Date End Date Taking? Authorizing Provider  aspirin EC 81 MG tablet Take 81 mg by mouth daily.   Yes [provider]  carbidopa-levodopa (SINEMET IR) 25-100 MG tablet Take by mouth. 03/08/17  Yes [provider]  cholecalciferol (VITAMIN D) 1000 UNITS tablet Take 1,000 Units by mouth daily. Reported on 07/08/2015   Yes [provider]  docusate sodium (COLACE) 100 MG capsule Take 100 mg by mouth 2 (two) times daily.   Yes [provider]  fluticasone (FLONASE) 50 MCG/ACT nasal spray Place 2 sprays into both nostrils daily.   Yes [provider]  furosemide (LASIX) 40 MG tablet  11/30/16  Yes [provider]  gabapentin (NEURONTIN) 300 MG capsule Take 1 capsule (300 mg total) by mouth 3 (three) times daily. Patient taking differently: Take 300 mg by mouth at bedtime.  11/09/16 04/11/17 Yes Vevelyn Francois, NP  levothyroxine (SYNTHROID, LEVOTHROID) 75 MCG tablet  03/23/17  Yes [provider]  Linaclotide (LINZESS) 145 MCG CAPS capsule Take 145 mcg by mouth daily.   Yes [provider]  loratadine (CLARITIN) 10 MG tablet Take 10 mg by mouth daily.   Yes [provider]  Multiple Vitamins-Minerals (ALIVE WOMENS ENERGY PO) Take 1 tablet by mouth daily.   Yes [provider]  mupirocin ointment (BACTROBAN) 2 % Place 1 application into the nose 2 (two) times daily. 04/04/17  Yes Edrick Kins, DPM  nitroGLYCERIN (NITROSTAT) 0.4 MG SL tablet Place 0.4 mg under the tongue every 5 (five) minutes as needed for chest pain.  10/03/16  Yes [provider]  olopatadine (PATANOL) 0.1 % ophthalmic solution 1 drop 2 (two) times daily.   Yes [provider]  omeprazole (PRILOSEC) 20 MG capsule Take 20 mg by mouth daily.   Yes [provider]  oxyCODONE-acetaminophen (PERCOCET/ROXICET) 5-325 MG tablet Take 1 tablet by mouth every 6 (six) hours as needed for severe pain.   Yes [provider]  potassium chloride SA (K-DUR,KLOR-CON) 20 MEQ tablet Take 20 mEq by mouth 2 (two) times daily.  08/18/16  Yes [provider]  rosuvastatin (CRESTOR) 20 MG tablet Take 20 mg by mouth daily.   Yes [provider]  sertraline (ZOLOFT) 100 MG tablet Take 50-100 mg by mouth 2 (two) times daily. Take 100 mg in the am and 50 mg at bedtime.   Yes [provider]  traMADol-acetaminophen (ULTRACET) 37.5-325 MG tablet Take 1 tablet by mouth every 6  (six) hours as needed for pain.   Yes [provider]  traZODone (DESYREL) 50 MG tablet Take 50 mg by mouth at bedtime as needed for sleep.    Yes [provider]  vitamin B-12 (CYANOCOBALAMIN) 500 MCG tablet Take 500 mcg by mouth daily.    Yes [provider]  carvedilol (COREG) 6.25 MG tablet  12/08/16   [provider]  clonazePAM (KLONOPIN) 0.5 MG tablet Take 0.5 mg by mouth 2 (two) times daily.    [provider]  clopidogrel (PLAVIX) 75 MG tablet  11/17/16   [provider]  lamoTRIgine (LAMICTAL) 100 MG tablet  11/23/16   [provider]  levothyroxine (SYNTHROID, LEVOTHROID) 100 MCG tablet Take 100 mcg by mouth daily before breakfast.    [provider]  NONFORMULARY OR COMPOUNDED [place]  Peripheral Neuropathy Cream- Bupivacaine 1%, Doxepin  3%, Gabapentin 6%, Pentoxifylline 3%, Topiramate 1% Apply 1-2 grams to affected area 3-4 times daily Qty. 120 gm 3 refills Patient not taking: Reported on 03/06/2017 01/31/17   Edrick Kins, DPM  Olopatadine HCl 0.2 % SOLN  11/02/16   [provider]  thiothixene (NAVANE) 5 MG capsule Take 5 mg by mouth 2 (two) times daily. 08/09/16   [provider]  tiZANidine (ZANAFLEX) 2 MG tablet Take 1 tablet (2 mg total) by mouth at bedtime as needed for muscle spasms. Patient not taking: Reported on 04/11/2017 03/06/17   Gillis Santa, MD      PHYSICAL EXAMINATION:   VITAL SIGNS: Blood pressure 139/75, pulse 68, temperature 98.7 F (37.1 C), temperature source Oral, resp. rate 18, height 5\' 6"  (1.676 m), weight 68 kg (150 lb), SpO2 99 %.  GENERAL:  77 y.o.-year-old patient lying in the bed with no acute distress.  EYES: Pupils equal, round, reactive to light and accommodation. No scleral icterus. Extraocular muscles intact.  HEENT: Head atraumatic, normocephalic. Oropharynx dry and nasopharynx clear.  NECK:  Supple, no jugular venous distention. No  thyroid enlargement, no tenderness.  LUNGS: Normal breath sounds bilaterally, no wheezing, rales,rhonchi or crepitation. No use of accessory muscles of respiration.  CARDIOVASCULAR: S1, S2 normal. No murmurs, rubs, or gallops.  ABDOMEN: Soft, nontender, nondistended. Bowel sounds present. No organomegaly or mass.  EXTREMITIES: No pedal edema, cyanosis, or clubbing.  NEUROLOGIC: Cranial nerves II through XII are intact. Muscle strength 5/5 in all extremities. Sensation intact. Gait not checked.  PSYCHIATRIC: The patient is alert and oriented x 3.  SKIN: No obvious rash, lesion, or ulcer.   LABORATORY PANEL:   CBC Recent Labs  Lab 04/10/17 2305  WBC 7.6  HGB 12.4  HCT 37.9  PLT 96*  MCV 97.5  MCH 31.9  MCHC 32.7  RDW 14.9*   ------------------------------------------------------------------------------------------------------------------  Chemistries  Recent Labs  Lab 04/10/17 2305  NA 129*  K 3.8  CL 87*  CO2 30  GLUCOSE 708*  BUN 19  CREATININE 0.99  CALCIUM 8.9  AST 49*  ALT 6*  ALKPHOS 151*  BILITOT 0.7   ------------------------------------------------------------------------------------------------------------------ estimated creatinine clearance is 43.8 mL/min (by C-G formula based on SCr of 0.99 mg/dL). ------------------------------------------------------------------------------------------------------------------ No results for input(s): TSH, T4TOTAL, T3FREE, THYROIDAB in the last 72 hours.  Invalid input(s): FREET3   Coagulation profile No results for input(s): INR, PROTIME in the last 168 hours. ------------------------------------------------------------------------------------------------------------------- No results for input(s): DDIMER in the last 72 hours. -------------------------------------------------------------------------------------------------------------------  Cardiac Enzymes Recent Labs  Lab 04/10/17 2305 04/11/17 0331   TROPONINI 0.05* 0.05*   ------------------------------------------------------------------------------------------------------------------ Invalid input(s): POCBNP  ---------------------------------------------------------------------------------------------------------------  Urinalysis    Component Value Date/Time   COLORURINE STRAW (A) 04/10/2017 2305   APPEARANCEUR HAZY (A) 04/10/2017 2305   APPEARANCEUR Clear 02/19/2014 2041   LABSPEC 1.023 04/10/2017 2305   LABSPEC 1.003 02/19/2014 2041   PHURINE 6.0 04/10/2017 2305   GLUCOSEU >=500 (A) 04/10/2017 2305   GLUCOSEU Negative 02/19/2014 2041   HGBUR LARGE (A) 04/10/2017 2305   BILIRUBINUR NEGATIVE 04/10/2017 2305   BILIRUBINUR Negative 02/19/2014 2041   KETONESUR NEGATIVE 04/10/2017 2305   PROTEINUR NEGATIVE 04/10/2017 2305   NITRITE NEGATIVE 04/10/2017 2305   LEUKOCYTESUR SMALL (A) 04/10/2017 2305   LEUKOCYTESUR Trace 02/19/2014 2041     RADIOLOGY: Ct Head Wo Contrast  Result Date: 04/11/2017 CLINICAL DATA:  Syncope and weakness EXAM: CT HEAD WITHOUT CONTRAST CT CERVICAL SPINE WITHOUT CONTRAST TECHNIQUE: Multidetector CT imaging of the head and cervical  spine was performed following the standard protocol without intravenous contrast. Multiplanar CT image reconstructions of the cervical spine were also generated. COMPARISON:  Head CT 12/23/2016 FINDINGS: CT HEAD FINDINGS Brain: No mass lesion, intraparenchymal hemorrhage or extra-axial collection. No evidence of acute cortical infarct. There is periventricular hypoattenuation compatible with chronic microvascular disease. Vascular: No hyperdense vessel or unexpected calcification. Skull: Normal visualized skull base, calvarium and extracranial soft tissues. Sinuses/Orbits: No sinus fluid levels or advanced mucosal thickening. No mastoid effusion. Normal orbits. CT CERVICAL SPINE FINDINGS Alignment: Grade 1 retrolisthesis at C3-C4. Grade 1 anterolisthesis at C4-C5 and C7-T1. Skull  base and vertebrae: No acute fracture. Soft tissues and spinal canal: No prevertebral fluid or swelling. No visible canal hematoma. Disc levels: Multilevel uncovertebral hypertrophy greatest at C5-C6 and C6-C7. No bony spinal canal stenosis. Multilevel facet hypertrophy, greatest at C7-T1 and C4-C5. Moderate-to-severe left C4-5 foraminal stenosis. Upper chest: No pneumothorax, pulmonary nodule or pleural effusion. Other: Normal visualized paraspinal cervical soft tissues. IMPRESSION: 1. Chronic ischemic microangiopathy without acute intracranial abnormality. 2. No acute fracture of the cervical spine. 3. Multilevel degenerative disc disease and facet arthrosis with degenerative grade 1 spondylolisthesis at C3-4, C4-5 and C7-T1. Electronically Signed   By: Ulyses Jarred M.D.   On: 04/11/2017 01:12   Ct Cervical Spine Wo Contrast  Result Date: 04/11/2017 CLINICAL DATA:  Syncope and weakness EXAM: CT HEAD WITHOUT CONTRAST CT CERVICAL SPINE WITHOUT CONTRAST TECHNIQUE: Multidetector CT imaging of the head and cervical spine was performed following the standard protocol without intravenous contrast. Multiplanar CT image reconstructions of the cervical spine were also generated. COMPARISON:  Head CT 12/23/2016 FINDINGS: CT HEAD FINDINGS Brain: No mass lesion, intraparenchymal hemorrhage or extra-axial collection. No evidence of acute cortical infarct. There is periventricular hypoattenuation compatible with chronic microvascular disease. Vascular: No hyperdense vessel or unexpected calcification. Skull: Normal visualized skull base, calvarium and extracranial soft tissues. Sinuses/Orbits: No sinus fluid levels or advanced mucosal thickening. No mastoid effusion. Normal orbits. CT CERVICAL SPINE FINDINGS Alignment: Grade 1 retrolisthesis at C3-C4. Grade 1 anterolisthesis at C4-C5 and C7-T1. Skull base and vertebrae: No acute fracture. Soft tissues and spinal canal: No prevertebral fluid or swelling. No visible canal  hematoma. Disc levels: Multilevel uncovertebral hypertrophy greatest at C5-C6 and C6-C7. No bony spinal canal stenosis. Multilevel facet hypertrophy, greatest at C7-T1 and C4-C5. Moderate-to-severe left C4-5 foraminal stenosis. Upper chest: No pneumothorax, pulmonary nodule or pleural effusion. Other: Normal visualized paraspinal cervical soft tissues. IMPRESSION: 1. Chronic ischemic microangiopathy without acute intracranial abnormality. 2. No acute fracture of the cervical spine. 3. Multilevel degenerative disc disease and facet arthrosis with degenerative grade 1 spondylolisthesis at C3-4, C4-5 and C7-T1. Electronically Signed   By: Ulyses Jarred M.D.   On: 04/11/2017 01:12   Ct Abdomen Pelvis W Contrast  Result Date: 04/11/2017 CLINICAL DATA:  Syncopal episode tonight. Weakness. Excessive thirst and urination. Abdominal pain, nausea and vomiting. EXAM: CT ABDOMEN AND PELVIS WITH CONTRAST TECHNIQUE: Multidetector CT imaging of the abdomen and pelvis was performed using the standard protocol following bolus administration of intravenous contrast. CONTRAST:  120mL ISOVUE-300 IOPAMIDOL (ISOVUE-300) INJECTION 61% COMPARISON:  None. FINDINGS: Lower chest: Lung bases are clear. Cardiac enlargement. Moderate-sized esophageal hiatal hernia. Hepatobiliary: Focal low-attenuation lesion centrally in segment 4 of the liver measuring 2.1 cm diameter. This appears to demonstrate some fill in on the delayed imaging, indicating that it may indicate a hemangioma. However, motion artifact limits the evaluation. Consider follow-up ultrasound for further correlation. No other focal lesions identified. Gallbladder  and bile ducts are unremarkable. Pancreas: Unremarkable. No pancreatic ductal dilatation or surrounding inflammatory changes. Spleen: Normal in size without focal abnormality. Adrenals/Urinary Tract: No adrenal gland nodules. Kidneys are symmetrical and nephrograms are homogeneous. No hydronephrosis or hydroureter and  no stones are demonstrated. The both ureters demonstrate evidence of wall thickening, greater on the left. This may indicate pyelonephritis. Bladder wall is not significantly thickened and no bladder filling defects are demonstrated. Stomach/Bowel: Stomach is within normal limits. Appendix appears normal. No evidence of bowel wall thickening, distention, or inflammatory changes. Diverticula in the sigmoid colon without evidence of diverticulitis. Vascular/Lymphatic: Aortic atherosclerosis. No enlarged abdominal or pelvic lymph nodes. Reproductive: Status post hysterectomy. No adnexal masses. Other: No abdominal wall hernia or abnormality. No abdominopelvic ascites. Musculoskeletal: Degenerative changes in the spine. Mild anterior subluxation of L4 on L5, likely degenerative. Postoperative changes in the right proximal femur. IMPRESSION: 1. Nonspecific focal low-attenuation lesion in segment 4 of the liver measuring 2.1 cm diameter. Possible hemangioma. Consider ultrasound for further characterization. 2. Wall thickening in the ureters, greater on the left. This may indicate pyelonephritis. Bladder is unremarkable. 3. Moderate esophageal hiatal hernia. 4. Aortic atherosclerosis. Electronically Signed   By: Lucienne Capers M.D.   On: 04/11/2017 01:23    EKG: Orders placed or performed during the hospital encounter of 04/10/17  . ED EKG  . ED EKG  . EKG 12-Lead  . EKG 12-Lead    IMPRESSION AND PLAN: 77 year old female patient with history of Parkinson's disease, hypertension, hyperlipidemia, hypothyroidism, spinal stenosis presented to the emergency room after she lost balance fell down.  Admitting diagnosis 1.  Syncope and fall 2.  New-onset diabetes mellitus 3.  Hyponatremia 4  mildly elevated troponin. Treatment plan Admit patient to telemetry Cycle troponin Check echocardiogram IV fluids Control blood sugars Follow-up electrolytes.  All the records are reviewed and case discussed with ED  provider. Management plans discussed with the patient, family and they are in agreement.  CODE STATUS:FULL CODE Code Status History    Date Active Date Inactive Code Status Order ID Comments User Context   12/24/2016 06:40 12/26/2016 20:20 Full Code [id number]  Harrie Foreman, MD ED   11/17/2016 04:50 11/17/2016 19:54 Full Code [id number]  Saundra Shelling, MD Inpatient   09/29/2016 20:02 09/30/2016 16:05 Full Code [id number]  Epifanio Lesches, MD ED    Advance Directive Documentation     Most Recent Value  Type of Advance Directive  Living will  Pre-existing out of facility DNR order (yellow form or pink MOST form)  No data  "MOST" Form in Place?  No data       TOTAL TIME TAKING CARE OF THIS PATIENT: 52 minutes.    Saundra Shelling M.D on 04/11/2017 at 4:32 AM  Between 7am to 6pm - Pager - [phone]  After 6pm go to [email or web address] - password [email or web address] [facility]  [facility]  Office  [phone]  CC: Primary care physician; Lorelee Market, MD

## 2017-04-11 NOTE — Progress Notes (Signed)
Paged MD to notify about patients blood pressures of 86/48 and 82/47. Patient asymptomatic. MD aware with verbal orders to increase Normal Saline fluid rate to 181ml.hr. Will increase and continue to monitor patient.

## 2017-04-11 NOTE — Progress Notes (Signed)
*  PRELIMINARY RESULTS* Echocardiogram 2D Echocardiogram has been performed.  Kristi [name] 04/11/2017, 12:13 PM

## 2017-04-11 NOTE — Progress Notes (Signed)
Notified nurse that patient's blood pressure was 86/48 (57) at 11: 36am and then 82/47(55) at 11:45am.

## 2017-04-11 NOTE — Progress Notes (Signed)
Provided patient with the diabetes book and started education on diabetes management. Patient verbalized understanding and asked appropriate questions. All questions answered. Will continue to monitor patient.

## 2017-04-11 NOTE — Plan of Care (Signed)
  Progressing Metabolic: Ability to maintain appropriate glucose levels will improve 04/11/2017 1909 - Progressing by Chriss Czar, Illene Bolus, RN Nutritional: Maintenance of adequate nutrition will improve 04/11/2017 1909 - Progressing by Feliberto Gottron, RN

## 2017-04-11 NOTE — Progress Notes (Signed)
Attempted to take orthostatic vitals, patient unable to sit on the side of the bed or stand.

## 2017-04-11 NOTE — Plan of Care (Signed)
  RD consulted for nutrition education regarding diabetes.  Pt with new diagnosis of diabetes and no prior education regarding diabetes. Pt has multiple family members with diabetes but is not familiar with carbohydrate counting. Pt also not familiar with the nutrition facts label. Discussed importance of looking at serving size first when assessing carbohydrate content of a food using the nutrition facts label.  No results found for: HGBA1C  RD provided "Carbohydrate Counting for People with Diabetes" and "Diabetes Label Reading Tips" handouts from the Academy of Nutrition and Dietetics. Discussed different food groups and their effects on blood sugar, emphasizing carbohydrate-containing foods. Provided list of carbohydrates and recommended serving sizes of common foods.  Discussed importance of controlled and consistent carbohydrate intake throughout the day. Provided examples of ways to balance meals/snacks and encouraged intake of high-fiber, whole grain complex carbohydrates. Teach back method used.  Expect fair compliance.  Body mass index is 25.12 kg/m. Pt meets criteria for Overweight based on current BMI.  Current diet order is Heart Healthy/Carb Modified, patient is consuming approximately 100% of meals at this time. Labs and medications reviewed. No further nutrition interventions warranted at this time. RD contact information provided. If additional nutrition issues arise, please re-consult RD.  Gaynell Face, MS, RD, LDN Pager: [phone] Weekend/After Hours: [phone]

## 2017-04-11 NOTE — ED Provider Notes (Signed)
[facility] Emergency Department Provider Note   ____________________________________________   First MD Initiated Contact with Patient 04/10/17 2323     (approximate)  I have reviewed the triage vital signs and the nursing notes.   HISTORY  Chief Complaint Loss of Consciousness and Weakness    HPI Kristi [name] is a 77 y.o. female who comes into the hospital today after a fall.  The patient states that she was weak.  She got up and fell in the bathtub.  The patient states she did hit her head when she fell.  The patient has been having some excessive thirst and frequent urination as well as dizziness for the past month.  She reports that she mentioned it to her physician but she is unsure if they checked for what was going on.  The patient does not think that she passed out.  She denies any chest pain or shortness of breath.  She has had some nausea and vomited once yesterday.  She states that she has had some lower abdominal pain as well.  Currently her pain is a 2 out of 10 in intensity.  The patient is here today for evaluation of her fall as well as her weakness and dizziness.  Past Medical History:  Diagnosis Date  . Anxiety   . Broken arm    left FA 3/17  . Broken arm    left  . CAD (coronary artery disease)    s/p MI  . Depression   . Hyperlipidemia   . Hypertension   . Hypothyroid   . Neck pain 06/04/2014  . Parkinson's disease ([place])   . Spinal stenosis     Patient Active Problem List   Diagnosis Date Noted  . Syncope and collapse 04/11/2017  . Lumbar facet osteoarthritis (Bilateral) 01/17/2017  . Acute postoperative pain 01/17/2017  . Hypokalemia 12/24/2016  . Anxiety 12/07/2016  . 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 Cerebrovascular accident ([place]) 12/07/2016  . Chronic depression 12/07/2016  . Essential hypertension 12/07/2016  . Hypothyroidism 12/07/2016  . Mixed hyperlipidemia 12/07/2016  . Myocardial infarction ([place])  12/07/2016  . TIA (transient ischemic attack) 11/17/2016  . Parkinson disease ([place]) 10/27/2016  . Tremor 10/27/2016  . Chronic knee pain (B) (R>L) 10/19/2016  . Chest pain 09/29/2016  . Abnormal CT scan, lumbar spine 09/08/2016  . Lumbar foraminal stenosis (multilevel) 09/08/2016  . Fracture of clavicle 08/03/2016  . Polyneuropathy 05/17/2016  . Neurogenic pain 04/12/2016  . Chronic lower extremity pain (Bilateral) (R>L) 04/12/2016  . Chronic lower extremity radicular pain (Primary Source of Pain) (Bilateral) (R>L) 04/12/2016  . Lower extremity weakness 04/12/2016  . Chronic low back pain (Secondary source of pain) (Bilateral) (R>L) 04/12/2016  . Chronic wrist pain (Left) (secondary to fracture) 04/12/2016  . Lumbar spondylosis 04/12/2016  . Chronic pain syndrome 04/11/2016  . Long term current use of opiate analgesic 04/11/2016  . Long term prescription opiate use 04/11/2016  . Opiate use 04/11/2016  . Long term prescription benzodiazepine use 04/11/2016  . Lumbar radiculopathy (Multilevel) (Bilateral) 05/07/2015  . Lumbar facet syndrome (Bilateral) (R>L) 08/21/2014  . 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 Sacroiliac joint dysfunction (Bilateral) 08/13/2014  . Frequent falls 06/04/2014  . Memory loss 06/04/2014  . Anxiety and depression 08/22/2013  . CAD (coronary artery disease) 08/22/2013  . Therapeutic opioid-induced constipation (OIC) 08/22/2013  . GERD (gastroesophageal reflux disease) 08/22/2013    Past Surgical History:  Procedure Laterality Date  .  ABDOMINAL HYSTERECTOMY    . CORONARY ARTERY BYPASS GRAFT    . CORONARY STENT PLACEMENT    . s/p pacer insertion      Prior to Admission medications   Medication Sig Start Date End Date Taking? Authorizing Provider  aspirin EC 81 MG tablet Take 81 mg by mouth daily.   Yes [provider]  carbidopa-levodopa (SINEMET IR) 25-100 MG tablet Take by mouth. 03/08/17  Yes [provider]  cholecalciferol (VITAMIN D) 1000 UNITS tablet Take 1,000 Units by mouth daily. Reported on 07/08/2015   Yes [provider]  docusate sodium (COLACE) 100 MG capsule Take 100 mg by mouth 2 (two) times daily.   Yes [provider]  fluticasone (FLONASE) 50 MCG/ACT nasal spray Place 2 sprays into both nostrils daily.   Yes [provider]  furosemide (LASIX) 40 MG tablet  11/30/16  Yes [provider]  gabapentin (NEURONTIN) 300 MG capsule Take 1 capsule (300 mg total) by mouth 3 (three) times daily. Patient taking differently: Take 300 mg by mouth at bedtime.  11/09/16 04/11/17 Yes Vevelyn Francois, NP  levothyroxine (SYNTHROID, LEVOTHROID) 75 MCG tablet  03/23/17  Yes [provider]  Linaclotide (LINZESS) 145 MCG CAPS capsule Take 145 mcg by mouth daily.   Yes [provider]  loratadine (CLARITIN) 10 MG tablet Take 10 mg by mouth daily.   Yes [provider]  Multiple Vitamins-Minerals (ALIVE WOMENS ENERGY PO) Take 1 tablet by mouth daily.   Yes [provider]  mupirocin ointment (BACTROBAN) 2 % Place 1 application into the nose 2 (two) times daily. 04/04/17  Yes Edrick Kins, DPM  nitroGLYCERIN (NITROSTAT) 0.4 MG SL tablet Place 0.4 mg under the tongue every 5 (five) minutes as needed for chest pain.  10/03/16  Yes [provider]  olopatadine (PATANOL) 0.1 % ophthalmic solution 1 drop 2 (two) times daily.   Yes [provider]  omeprazole (PRILOSEC) 20 MG capsule Take 20 mg by mouth daily.   Yes [provider]  oxyCODONE-acetaminophen (PERCOCET/ROXICET) 5-325 MG tablet Take 1 tablet by mouth every 6 (six) hours as needed for severe pain.   Yes [provider]  potassium chloride SA (K-DUR,KLOR-CON) 20 MEQ tablet Take 20 mEq by mouth 2 (two) times daily.  08/18/16  Yes [provider]  rosuvastatin (CRESTOR) 20 MG tablet Take 20 mg by mouth daily.   Yes [provider]  sertraline (ZOLOFT) 100 MG tablet Take 50-100 mg by mouth 2 (two) times daily. Take 100 mg in the am and 50 mg at bedtime.   Yes [provider]  traMADol-acetaminophen (ULTRACET) 37.5-325 MG tablet Take 1 tablet by mouth every 6 (six) hours as needed for pain.   Yes [provider]  traZODone (DESYREL) 50 MG tablet Take 50 mg by mouth at bedtime as needed for sleep.    Yes [provider]  vitamin B-12 (CYANOCOBALAMIN) 500 MCG tablet Take 500 mcg by mouth daily.    Yes [provider]  carvedilol (COREG) 6.25 MG tablet  12/08/16   [provider]  clonazePAM (KLONOPIN) 0.5 MG tablet Take 0.5 mg by mouth 2 (two) times daily.    [provider]  clopidogrel (PLAVIX) 75 MG tablet  11/17/16   [provider]  lamoTRIgine (LAMICTAL) 100 MG tablet  11/23/16   [provider]  levothyroxine (SYNTHROID, LEVOTHROID) 100 MCG tablet Take 100 mcg by mouth daily before breakfast.    [provider]  NONFORMULARY OR COMPOUNDED ITEM [facility]  Peripheral Neuropathy Cream- Bupivacaine 1%, Doxepin 3%, Gabapentin 6%, Pentoxifylline 3%, Topiramate 1% Apply 1-2 grams to affected area 3-4 times daily Qty. 120 gm 3 refills Patient not taking: Reported on 03/06/2017 01/31/17   Edrick Kins, DPM  Olopatadine HCl 0.2 % SOLN  11/02/16   [provider]  thiothixene (NAVANE) 5 MG capsule Take 5 mg by mouth 2 (two) times daily. 08/09/16   [provider]  tiZANidine (ZANAFLEX) 2 MG tablet Take 1 tablet (2 mg total) by mouth at bedtime as needed for muscle spasms. Patient not taking: Reported on 04/11/2017 03/06/17   Gillis Santa, MD    Allergies Penicillins and Penicillin g  Family History  Problem Relation Age of Onset  . Cancer Mother   . Depression Mother   . Diabetes Mother   . Hypertension Mother   . Heart disease Father     Social History Social History   Tobacco Use  . Smoking  status: Never Smoker  . Smokeless tobacco: Never Used  Substance Use Topics  . Alcohol use: No  . Drug use: No    Review of Systems  Constitutional: No fever/chills Eyes: No visual changes. ENT: No sore throat. Cardiovascular: Denies chest pain. Respiratory: Denies shortness of breath. Gastrointestinal: No abdominal pain.  No nausea, no vomiting.  No diarrhea.  No constipation. Genitourinary: Negative for dysuria. Musculoskeletal: Negative for back pain. Skin: Negative for rash. Neurological: Negative for headaches, focal weakness or numbness.   ____________________________________________   PHYSICAL EXAM:  VITAL SIGNS: ED Triage Vitals  Enc Vitals Group     BP 04/10/17 2306 (!) 144/86     Pulse Rate 04/10/17 2306 71     Resp 04/10/17 2306 15     Temp 04/10/17 2306 98.7 F (37.1 C)     Temp Source 04/10/17 2306 Oral     SpO2 04/10/17 2306 99 %     Weight 04/10/17 2302 150 lb (68 kg)     Height 04/10/17 2302 5\' 6"  (1.676 m)     Head Circumference --      Peak Flow --      Pain Score 04/10/17 2302 0     Pain Loc --      Pain Edu? --      Excl. in [place]? --     Constitutional: Alert and oriented. Well appearing and in mild distress. Eyes: Conjunctivae are normal. PERRL. EOMI. Head: Atraumatic. Nose: No congestion/rhinnorhea. Mouth/Throat: Mucous membranes are moist.  Oropharynx non-erythematous. Neck: No cervical spine tenderness to palpation Cardiovascular: Normal rate, regular rhythm. Grossly normal heart sounds.  Good peripheral circulation. Respiratory: Normal respiratory effort.  No retractions. Lungs CTAB. Gastrointestinal: Soft with some left-sided mild tenderness to palpation. No distention.  Positive bowel sounds Musculoskeletal: No lower extremity tenderness nor edema.   Neurologic:  Normal speech and language.  Cranial nerves II through XII are grossly intact with no focal motor neuro deficit Skin:  Skin is warm, dry and intact.  Psychiatric: Mood and  affect are normal.   ____________________________________________   LABS (all labs ordered are listed, but only abnormal results are displayed)  Labs Reviewed  BASIC METABOLIC PANEL - Abnormal; Notable for the following components:      Result Value   Sodium 129 (*)    Chloride 87 (*)    Glucose, Bld 708 (*)    GFR calc non Af Amer 53 (*)    All other components within  normal limits  CBC - Abnormal; Notable for the following components:   RDW 14.9 (*)    Platelets 96 (*)    All other components within normal limits  URINALYSIS, COMPLETE (UACMP) WITH MICROSCOPIC - Abnormal; Notable for the following components:   Color, Urine STRAW (*)    APPearance HAZY (*)    Glucose, UA >=500 (*)    Hgb urine dipstick LARGE (*)    Leukocytes, UA SMALL (*)    All other components within normal limits  GLUCOSE, CAPILLARY - Abnormal; Notable for the following components:   Glucose-Capillary >600 (*)    All other components within normal limits  GLUCOSE, CAPILLARY - Abnormal; Notable for the following components:   Glucose-Capillary >600 (*)    All other components within normal limits  HEPATIC FUNCTION PANEL - Abnormal; Notable for the following components:   Albumin 3.4 (*)    AST 49 (*)    ALT 6 (*)    Alkaline Phosphatase 151 (*)    All other components within normal limits  TROPONIN I - Abnormal; Notable for the following components:   Troponin I 0.05 (*)    All other components within normal limits  GLUCOSE, CAPILLARY - Abnormal; Notable for the following components:   Glucose-Capillary 593 (*)    All other components within normal limits  GLUCOSE, CAPILLARY - Abnormal; Notable for the following components:   Glucose-Capillary 416 (*)    All other components within normal limits  TROPONIN I - Abnormal; Notable for the following components:   Troponin I 0.05 (*)    All other components within normal limits  URINE CULTURE  CBG MONITORING, ED  CBG MONITORING, ED  CBG MONITORING,  ED   ____________________________________________  EKG  ED ECG REPORT I, Loney Hering, the attending physician, personally viewed and interpreted this ECG.   Date: 04/10/2017  EKG Time: 2311  Rate: 66  Rhythm: ventricular paced rhythm  Axis: left axis deviation  Intervals:ventricular paced rhythm  ST&T Change: ventricular paced rhythm  ____________________________________________  RADIOLOGY  Ct Head Wo Contrast  Result Date: 04/11/2017 CLINICAL DATA:  Syncope and weakness EXAM: CT HEAD WITHOUT CONTRAST CT CERVICAL SPINE WITHOUT CONTRAST TECHNIQUE: Multidetector CT imaging of the head and cervical spine was performed following the standard protocol without intravenous contrast. Multiplanar CT image reconstructions of the cervical spine were also generated. COMPARISON:  Head CT 12/23/2016 FINDINGS: CT HEAD FINDINGS Brain: No mass lesion, intraparenchymal hemorrhage or extra-axial collection. No evidence of acute cortical infarct. There is periventricular hypoattenuation compatible with chronic microvascular disease. Vascular: No hyperdense vessel or unexpected calcification. Skull: Normal visualized skull base, calvarium and extracranial soft tissues. Sinuses/Orbits: No sinus fluid levels or advanced mucosal thickening. No mastoid effusion. Normal orbits. CT CERVICAL SPINE FINDINGS Alignment: Grade 1 retrolisthesis at C3-C4. Grade 1 anterolisthesis at C4-C5 and C7-T1. Skull base and vertebrae: No acute fracture. Soft tissues and spinal canal: No prevertebral fluid or swelling. No visible canal hematoma. Disc levels: Multilevel uncovertebral hypertrophy greatest at C5-C6 and C6-C7. No bony spinal canal stenosis. Multilevel facet hypertrophy, greatest at C7-T1 and C4-C5. Moderate-to-severe left C4-5 foraminal stenosis. Upper chest: No pneumothorax, pulmonary nodule or pleural effusion. Other: Normal visualized paraspinal cervical soft tissues. IMPRESSION: 1. Chronic ischemic microangiopathy  without acute intracranial abnormality. 2. No acute fracture of the cervical spine. 3. Multilevel degenerative disc disease and facet arthrosis with degenerative grade 1 spondylolisthesis at C3-4, C4-5 and C7-T1. Electronically Signed   By: Ulyses Jarred M.D.   On:  04/11/2017 01:12   Ct Cervical Spine Wo Contrast  Result Date: 04/11/2017 CLINICAL DATA:  Syncope and weakness EXAM: CT HEAD WITHOUT CONTRAST CT CERVICAL SPINE WITHOUT CONTRAST TECHNIQUE: Multidetector CT imaging of the head and cervical spine was performed following the standard protocol without intravenous contrast. Multiplanar CT image reconstructions of the cervical spine were also generated. COMPARISON:  Head CT 12/23/2016 FINDINGS: CT HEAD FINDINGS Brain: No mass lesion, intraparenchymal hemorrhage or extra-axial collection. No evidence of acute cortical infarct. There is periventricular hypoattenuation compatible with chronic microvascular disease. Vascular: No hyperdense vessel or unexpected calcification. Skull: Normal visualized skull base, calvarium and extracranial soft tissues. Sinuses/Orbits: No sinus fluid levels or advanced mucosal thickening. No mastoid effusion. Normal orbits. CT CERVICAL SPINE FINDINGS Alignment: Grade 1 retrolisthesis at C3-C4. Grade 1 anterolisthesis at C4-C5 and C7-T1. Skull base and vertebrae: No acute fracture. Soft tissues and spinal canal: No prevertebral fluid or swelling. No visible canal hematoma. Disc levels: Multilevel uncovertebral hypertrophy greatest at C5-C6 and C6-C7. No bony spinal canal stenosis. Multilevel facet hypertrophy, greatest at C7-T1 and C4-C5. Moderate-to-severe left C4-5 foraminal stenosis. Upper chest: No pneumothorax, pulmonary nodule or pleural effusion. Other: Normal visualized paraspinal cervical soft tissues. IMPRESSION: 1. Chronic ischemic microangiopathy without acute intracranial abnormality. 2. No acute fracture of the cervical spine. 3. Multilevel degenerative disc disease  and facet arthrosis with degenerative grade 1 spondylolisthesis at C3-4, C4-5 and C7-T1. Electronically Signed   By: Ulyses Jarred M.D.   On: 04/11/2017 01:12   Ct Abdomen Pelvis W Contrast  Result Date: 04/11/2017 CLINICAL DATA:  Syncopal episode tonight. Weakness. Excessive thirst and urination. Abdominal pain, nausea and vomiting. EXAM: CT ABDOMEN AND PELVIS WITH CONTRAST TECHNIQUE: Multidetector CT imaging of the abdomen and pelvis was performed using the standard protocol following bolus administration of intravenous contrast. CONTRAST:  160mL ISOVUE-300 IOPAMIDOL (ISOVUE-300) INJECTION 61% COMPARISON:  None. FINDINGS: Lower chest: Lung bases are clear. Cardiac enlargement. Moderate-sized esophageal hiatal hernia. Hepatobiliary: Focal low-attenuation lesion centrally in segment 4 of the liver measuring 2.1 cm diameter. This appears to demonstrate some fill in on the delayed imaging, indicating that it may indicate a hemangioma. However, motion artifact limits the evaluation. Consider follow-up ultrasound for further correlation. No other focal lesions identified. Gallbladder and bile ducts are unremarkable. Pancreas: Unremarkable. No pancreatic ductal dilatation or surrounding inflammatory changes. Spleen: Normal in size without focal abnormality. Adrenals/Urinary Tract: No adrenal gland nodules. Kidneys are symmetrical and nephrograms are homogeneous. No hydronephrosis or hydroureter and no stones are demonstrated. The both ureters demonstrate evidence of wall thickening, greater on the left. This may indicate pyelonephritis. Bladder wall is not significantly thickened and no bladder filling defects are demonstrated. Stomach/Bowel: Stomach is within normal limits. Appendix appears normal. No evidence of bowel wall thickening, distention, or inflammatory changes. Diverticula in the sigmoid colon without evidence of diverticulitis. Vascular/Lymphatic: Aortic atherosclerosis. No enlarged abdominal or pelvic  lymph nodes. Reproductive: Status post hysterectomy. No adnexal masses. Other: No abdominal wall hernia or abnormality. No abdominopelvic ascites. Musculoskeletal: Degenerative changes in the spine. Mild anterior subluxation of L4 on L5, likely degenerative. Postoperative changes in the right proximal femur. IMPRESSION: 1. Nonspecific focal low-attenuation lesion in segment 4 of the liver measuring 2.1 cm diameter. Possible hemangioma. Consider ultrasound for further characterization. 2. Wall thickening in the ureters, greater on the left. This may indicate pyelonephritis. Bladder is unremarkable. 3. Moderate esophageal hiatal hernia. 4. Aortic atherosclerosis. Electronically Signed   By: Lucienne Capers M.D.   On: 04/11/2017 01:23  ____________________________________________   PROCEDURES  Procedure(s) performed: None  Procedures  Critical Care performed: No  ____________________________________________   INITIAL IMPRESSION / ASSESSMENT AND PLAN / ED COURSE  As part of my medical decision making, I reviewed the following data within the electronic MEDICAL RECORD NUMBER Notes from prior ED visits and [place] Controlled Substance Database   This is a 77 year old female who comes into the hospital today after a fall.  The patient has been having some weakness and dizziness.  EMS states that her blood sugar read too high to read.  The patient is also been having some excessive thirst and frequent urination.  My differential diagnosis includes dehydration, cardiogenic syncope, neurogenic syncope  The patient's blood work returned showing a glucose of 708.  Her sodium was 129 and her chloride is 87 likely falsely lowered due to the hyperglycemia.  I will give the patient a liter of normal saline.  She does have a mildly elevated troponin.  I will send her for a CT scan of her head and cervical spine as well as her abdomen since she does have pain and did have vomiting yesterday.  The patient will be  reassessed.    The patient CT scans are unremarkable.  I did give the patient a liter of normal saline as well as 10 units of insulin.  The patient's blood sugar came down after the first liter to 593.  After the insulin and a second liter it came down to 416.  I gave the patient a 500 mL bolus of normal saline as well as another 5 units of insulin.  The patient did have an elevated troponin and given her age and this new onset diabetes I decided to admit the patient to the hospitalist service for further evaluation.  ____________________________________________   FINAL CLINICAL IMPRESSION(S) / ED DIAGNOSES  Final diagnoses:  Syncope, unspecified syncope type  Hyperglycemia  Weakness  Dizziness     ED Discharge Orders    None       Note:  This document was prepared using Dragon voice recognition software and may include unintentional dictation errors.    Loney Hering, MD 04/11/17 [phone]

## 2017-04-11 NOTE — Progress Notes (Signed)
Met with patient earlier this AM.    Patient having issues with Hypotension and was quite drowsy during our conversation.  Will need to revisit with patient once more awake.  Patient told me she lives by herself but has a hired Environmental consultant come to her house daily during the week (Monday through Friday) to help with household work.  Daughter looks after patient on the weekends.  Stated she has several family members with DM but that this is a new diagnosis for her.  Briefly discussed lab glucose results with patient and the treatment we are providing to her for her blood sugars.  Pt endorsed polydipsia and polyuria prior to admission.  Stated she had blurry vision as well.  Discussed with patient that her elevated CBGs were likely causing all of these symptoms and that as her CBGs return to safe levels that her symptoms should improve.  Have ordered Living Well with Diabetes booklet for patient and have asked RNs to begin providing basic DM survival skills education to pt (including CBG meter teaching).  RD has seen pt to provide basic DM diet education as well.  Will follow up with patient once she is less drowsy and more awake.     --Will follow patient during hospitalization--  Wyn Quaker RN, MSN, CDE Diabetes Coordinator Inpatient Glycemic Control Team Team Pager: [phone] (8a-5p)

## 2017-04-11 NOTE — Progress Notes (Addendum)
Paged MD to notify of patients low blood pressure of 99/49 and 92/47 this am. Patient asymptomatic.Per MD hold carvedilol and give 1L bolus. Will administer the bolus and continue monitoring patient.

## 2017-04-11 NOTE — Progress Notes (Signed)
[facility] [place]    MR#:  [id number]  DATE OF BIRTH:  March 25, 1940  SUBJECTIVE: Admitted this morning for syncope, found to have severe hyperglycemia. Patient very alert at this time, continue to have low blood pressure despite aggressive fluid hydration.  Patient denies any chest pain or shortness of breath.  No fever.  Complains of shoulder pain in both shoulders.  CHIEF COMPLAINT:   Chief Complaint  Patient presents with  . Loss of Consciousness  . Weakness    REVIEW OF SYSTEMS:   ROS CONSTITUTIONAL: No fever, fatigue or weakness.  EYES: No blurred or double vision.  EARS, NOSE, AND THROAT: No tinnitus or ear pain.  RESPIRATORY: No cough, shortness of breath, wheezing or hemoptysis.  CARDIOVASCULAR: No chest pain, orthopnea, edema.  GASTROINTESTINAL: No nausea, vomiting, diarrhea or abdominal pain.  GENITOURINARY: No dysuria, hematuria.  ENDOCRINE: No polyuria, nocturia,  HEMATOLOGY: No anemia, easy bruising or bleeding SKIN: No rash or lesion. MUSCULOSKELETAL shoulder pain bilaterally. NEUROLOGIC: No tingling, numbness, weakness.  PSYCHIATRY: No anxiety or depression.   DRUG ALLERGIES:   Allergies  Allergen Reactions  . Penicillins Anaphylaxis  . Penicillin G Swelling and Rash    Has patient had a PCN reaction causing immediate rash, facial/tongue/throat swelling, SOB or lightheadedness with hypotension: Yes Has patient had a PCN reaction causing severe rash involving mucus membranes or skin necrosis: No Has patient had a PCN reaction that required hospitalization: No Has patient had a PCN reaction occurring within the last 10 years: No If all of the above answers are "NO", then may proceed with Cephalosporin use.     VITALS:  Blood pressure (!) 82/56, pulse 64, temperature 98.9 F (37.2 C), temperature source Oral, resp. rate 12, height 5\' 7"  (1.702 m), weight 72.8 kg (160 lb 6.4 oz), SpO2 100  %.  PHYSICAL EXAMINATION:  GENERAL:  77 y.o.-year-old patient lying in the bed with no acute distress.  EYES: Pupils equal, round, reactive to light and . No scleral icterus. Extraocular muscles intact.  HEENT: Head atraumatic, normocephalic. Oropharynx and nasopharynx clear.  NECK:  Supple, no jugular venous distention. No thyroid enlargement, no tenderness.  LUNGS: Normal breath sounds bilaterally, no wheezing, rales,rhonchi or crepitation. No use of accessory muscles of respiration.  CARDIOVASCULAR: S1, S2 normal. No murmurs, rubs, or gallops.  ABDOMEN: Soft, nontender, nondistended. Bowel sounds present. No organomegaly or mass.  EXTREMITIES: No pedal edema, cyanosis, or clubbing.  NEUROLOGIC: Cranial nerves II through XII are intact. Muscle strength 5/5 in all extremities. Sensation intact. Gait not checked.  PSYCHIATRIC: The patient is alert and oriented x 3.  SKIN: No obvious rash, lesion, or ulcer.    LABORATORY PANEL:   CBC Recent Labs  Lab 04/11/17 0904  WBC 6.7  HGB 11.0*  HCT 33.8*  PLT 95*   ------------------------------------------------------------------------------------------------------------------  Chemistries  Recent Labs  Lab 04/10/17 2305 04/11/17 0904  NA 129* 133*  K 3.8 3.1*  CL 87* 99*  CO2 30 25  GLUCOSE 708* 248*  BUN 19 11  CREATININE 0.99 0.77  CALCIUM 8.9 7.9*  AST 49*  --   ALT 6*  --   ALKPHOS 151*  --   BILITOT 0.7  --    ------------------------------------------------------------------------------------------------------------------  Cardiac Enzymes Recent Labs  Lab 04/11/17 1230  TROPONINI 0.05*   ------------------------------------------------------------------------------------------------------------------  RADIOLOGY:  Ct Head Wo Contrast  Result Date: 04/11/2017 CLINICAL DATA:  Syncope and weakness EXAM: CT HEAD WITHOUT  CONTRAST CT CERVICAL SPINE WITHOUT CONTRAST TECHNIQUE: Multidetector CT imaging of the head and  cervical spine was performed following the standard protocol without intravenous contrast. Multiplanar CT image reconstructions of the cervical spine were also generated. COMPARISON:  Head CT 12/23/2016 FINDINGS: CT HEAD FINDINGS Brain: No mass lesion, intraparenchymal hemorrhage or extra-axial collection. No evidence of acute cortical infarct. There is periventricular hypoattenuation compatible with chronic microvascular disease. Vascular: No hyperdense vessel or unexpected calcification. Skull: Normal visualized skull base, calvarium and extracranial soft tissues. Sinuses/Orbits: No sinus fluid levels or advanced mucosal thickening. No mastoid effusion. Normal orbits. CT CERVICAL SPINE FINDINGS Alignment: Grade 1 retrolisthesis at C3-C4. Grade 1 anterolisthesis at C4-C5 and C7-T1. Skull base and vertebrae: No acute fracture. Soft tissues and spinal canal: No prevertebral fluid or swelling. No visible canal hematoma. Disc levels: Multilevel uncovertebral hypertrophy greatest at C5-C6 and C6-C7. No bony spinal canal stenosis. Multilevel facet hypertrophy, greatest at C7-T1 and C4-C5. Moderate-to-severe left C4-5 foraminal stenosis. Upper chest: No pneumothorax, pulmonary nodule or pleural effusion. Other: Normal visualized paraspinal cervical soft tissues. IMPRESSION: 1. Chronic ischemic microangiopathy without acute intracranial abnormality. 2. No acute fracture of the cervical spine. 3. Multilevel degenerative disc disease and facet arthrosis with degenerative grade 1 spondylolisthesis at C3-4, C4-5 and C7-T1. Electronically Signed   By: Ulyses Jarred M.D.   On: 04/11/2017 01:12   Ct Cervical Spine Wo Contrast  Result Date: 04/11/2017 CLINICAL DATA:  Syncope and weakness EXAM: CT HEAD WITHOUT CONTRAST CT CERVICAL SPINE WITHOUT CONTRAST TECHNIQUE: Multidetector CT imaging of the head and cervical spine was performed following the standard protocol without intravenous contrast. Multiplanar CT image  reconstructions of the cervical spine were also generated. COMPARISON:  Head CT 12/23/2016 FINDINGS: CT HEAD FINDINGS Brain: No mass lesion, intraparenchymal hemorrhage or extra-axial collection. No evidence of acute cortical infarct. There is periventricular hypoattenuation compatible with chronic microvascular disease. Vascular: No hyperdense vessel or unexpected calcification. Skull: Normal visualized skull base, calvarium and extracranial soft tissues. Sinuses/Orbits: No sinus fluid levels or advanced mucosal thickening. No mastoid effusion. Normal orbits. CT CERVICAL SPINE FINDINGS Alignment: Grade 1 retrolisthesis at C3-C4. Grade 1 anterolisthesis at C4-C5 and C7-T1. Skull base and vertebrae: No acute fracture. Soft tissues and spinal canal: No prevertebral fluid or swelling. No visible canal hematoma. Disc levels: Multilevel uncovertebral hypertrophy greatest at C5-C6 and C6-C7. No bony spinal canal stenosis. Multilevel facet hypertrophy, greatest at C7-T1 and C4-C5. Moderate-to-severe left C4-5 foraminal stenosis. Upper chest: No pneumothorax, pulmonary nodule or pleural effusion. Other: Normal visualized paraspinal cervical soft tissues. IMPRESSION: 1. Chronic ischemic microangiopathy without acute intracranial abnormality. 2. No acute fracture of the cervical spine. 3. Multilevel degenerative disc disease and facet arthrosis with degenerative grade 1 spondylolisthesis at C3-4, C4-5 and C7-T1. Electronically Signed   By: Ulyses Jarred M.D.   On: 04/11/2017 01:12   Ct Abdomen Pelvis W Contrast  Result Date: 04/11/2017 CLINICAL DATA:  Syncopal episode tonight. Weakness. Excessive thirst and urination. Abdominal pain, nausea and vomiting. EXAM: CT ABDOMEN AND PELVIS WITH CONTRAST TECHNIQUE: Multidetector CT imaging of the abdomen and pelvis was performed using the standard protocol following bolus administration of intravenous contrast. CONTRAST:  11mL ISOVUE-300 IOPAMIDOL (ISOVUE-300) INJECTION 61%  COMPARISON:  None. FINDINGS: Lower chest: Lung bases are clear. Cardiac enlargement. Moderate-sized esophageal hiatal hernia. Hepatobiliary: Focal low-attenuation lesion centrally in segment 4 of the liver measuring 2.1 cm diameter. This appears to demonstrate some fill in on the delayed imaging, indicating that it may indicate a hemangioma. However, motion  artifact limits the evaluation. Consider follow-up ultrasound for further correlation. No other focal lesions identified. Gallbladder and bile ducts are unremarkable. Pancreas: Unremarkable. No pancreatic ductal dilatation or surrounding inflammatory changes. Spleen: Normal in size without focal abnormality. Adrenals/Urinary Tract: No adrenal gland nodules. Kidneys are symmetrical and nephrograms are homogeneous. No hydronephrosis or hydroureter and no stones are demonstrated. The both ureters demonstrate evidence of wall thickening, greater on the left. This may indicate pyelonephritis. Bladder wall is not significantly thickened and no bladder filling defects are demonstrated. Stomach/Bowel: Stomach is within normal limits. Appendix appears normal. No evidence of bowel wall thickening, distention, or inflammatory changes. Diverticula in the sigmoid colon without evidence of diverticulitis. Vascular/Lymphatic: Aortic atherosclerosis. No enlarged abdominal or pelvic lymph nodes. Reproductive: Status post hysterectomy. No adnexal masses. Other: No abdominal wall hernia or abnormality. No abdominopelvic ascites. Musculoskeletal: Degenerative changes in the spine. Mild anterior subluxation of L4 on L5, likely degenerative. Postoperative changes in the right proximal femur. IMPRESSION: 1. Nonspecific focal low-attenuation lesion in segment 4 of the liver measuring 2.1 cm diameter. Possible hemangioma. Consider ultrasound for further characterization. 2. Wall thickening in the ureters, greater on the left. This may indicate pyelonephritis. Bladder is unremarkable. 3.  Moderate esophageal hiatal hernia. 4. Aortic atherosclerosis. Electronically Signed   By: Lucienne Capers M.D.   On: 04/11/2017 01:23    EKG:   Orders placed or performed during the hospital encounter of 04/10/17  . ED EKG  . ED EKG  . EKG 12-Lead  . EKG 12-Lead    ASSESSMENT AND PLAN:   #1 syncope secondary  Hypotension:/dehydration Continue on telemetry evaluate for arrhythmias, continue aggressive hydration. 2.  New onset diabetes mellitus type 2 with severe hyperglycemia blood sugar of 700 in the emergency room.  Discussed with the patient about diagnosis of diabetes.  She told me that all her family members have diabetes.  And she is not surprised.  Continue metformin, insulin with sliding scale coverage. 3.  Hyponatremia, hypokalemia secondary to dehydration, ; continue IV hydration 4.  Severe hypotension: Patient not symptomatic, continue aggressive hydration until today evening,, patient states that she takes Lasix at home: Stop all hypertensives,. 5.  Slightly elevated troponins likely demand ischemia: Echocardiogram done follow the results. 6.  History of CAD, CABG, PPM.  Follows up with Dr. Lavera Guise History of Parkinson disease: Continue carbidopa/levodopa. 7.  History of CAD: CABG.  Continue aspirin, Plavix, statins All the records are reviewed and case discussed with Care Management/Social Workerr. Management plans discussed with the patient, family and they are in agreement.  CODE STATUS: full  TOTAL TIME TAKING CARE OF THIS PATIENT: 35 minutes.   POSSIBLE D/C IN 1-2 DAYS, DEPENDING ON CLINICAL CONDITION.   Epifanio Lesches M.D on 04/11/2017 at 2:00 PM  Between 7am to 6pm - Pager - [phone]  After 6pm go to [email or web address] - password [email or web address] [facility]  Office  [phone]  CC: Primary care physician; Lorelee Market, MD   Note: This dictation was prepared with Dragon dictation along with smaller phrase technology. Any  transcriptional errors that result from this process are unintentional.

## 2017-04-11 NOTE — Progress Notes (Signed)
Inpatient Diabetes Program Recommendations  AACE/ADA: New Consensus Statement on Inpatient Glycemic Control (2015)  Target Ranges:  Prepandial:   less than 140 mg/dL      Peak postprandial:   less than 180 mg/dL (1-2 hours)      Critically ill patients:  140 - 180 mg/dL    Results for Kristi [name], Kristi [name] (MRN [id number]) as of 04/11/2017 08:15  Ref. Range 04/10/2017 23:04 04/11/2017 01:20 04/11/2017 02:56 04/11/2017 06:00 04/11/2017 07:46  Glucose-Capillary Latest Ref Range: 65 - 99 mg/dL >600 (HH) 593 (HH) 416 (H) 183 (H) 267 (H)    Admit with: New diagnosis of DM  History: Parkinson's  Current Insulin Orders: Novolog Moderate Correction Scale/ SSI (0-15 units) TID AC + HS      Novolog 3 units TID      Metformin 500 mg BID        Note patient admitted with New diagnosis of DM.  Received 2.5 L NS in the ED and started on SQ insulin.  CBGs have improved since admission.  Will order educational materials for patient ans have the nurses begin basic DM education at bedside.  Will also order RD consult for DM diet education.  Plan to see pt today to discuss new diagnosis.     --Will follow patient during hospitalization--  Wyn Quaker RN, MSN, CDE Diabetes Coordinator Inpatient Glycemic Control Team Team Pager: [phone] (8a-5p)

## 2017-04-11 NOTE — ED Notes (Signed)
Patient transported to CT 

## 2017-04-12 LAB — BASIC METABOLIC PANEL
Anion gap: 7 (ref 5–15)
BUN: 9 mg/dL (ref 6–20)
CO2: 23 mmol/L (ref 22–32)
Calcium: 7.9 mg/dL — ABNORMAL LOW (ref 8.9–10.3)
Chloride: 107 mmol/L (ref 101–111)
Creatinine, Ser: 0.75 mg/dL (ref 0.44–1.00)
GFR calc Af Amer: >60 mL/min (ref >60)
GFR calc non Af Amer: >60 mL/min (ref >60)
Glucose, Bld: 221 mg/dL — ABNORMAL HIGH (ref 65–99)
Potassium: 4.1 mmol/L (ref 3.5–5.1)
Sodium: 137 mmol/L (ref 135–145)

## 2017-04-12 LAB — GLUCOSE, CAPILLARY
Glucose-Capillary: >600 mg/dL (ref 65–99)
Glucose-Capillary: 182 mg/dL — ABNORMAL HIGH (ref 65–99)
Glucose-Capillary: 157 mg/dL — ABNORMAL HIGH (ref 65–99)
Glucose-Capillary: 103 mg/dL — ABNORMAL HIGH (ref 65–99)
Glucose-Capillary: 155 mg/dL — ABNORMAL HIGH (ref 65–99)

## 2017-04-12 MED ORDER — METFORMIN HCL 500 MG PO TABS: 1000.0000 mg | ORAL_TABLET | Freq: Two times a day (BID) | ORAL | Status: DC

## 2017-04-12 MED ORDER — DEXTROSE 5 % IV SOLN
1.0000 g | Freq: Every day | INTRAVENOUS | Status: AC
  Administered 2017-04-12 – 2017-04-17 (×6): 1 g via INTRAVENOUS
  Filled 2017-04-12 (×8): qty 10

## 2017-04-12 MED ORDER — METFORMIN HCL 500 MG PO TABS
500.0000 mg | ORAL_TABLET | Freq: Two times a day (BID) | ORAL | Status: DC
  Administered 2017-04-12 – 2017-04-17 (×11): 500 mg via ORAL
  Filled 2017-04-12 (×13): qty 1

## 2017-04-12 NOTE — Progress Notes (Addendum)
npatient Diabetes Program Recommendations  AACE/ADA: New Consensus Statement on Inpatient Glycemic Control (2015)  Target Ranges:  Prepandial:   less than 140 mg/dL      Peak postprandial:   less than 180 mg/dL (1-2 hours)      Critically ill patients:  140 - 180 mg/dL  Results for Kristi [name], Kristi [name] (MRN [id number]) as of 04/12/2017 09:27  Ref. Range 04/11/2017 07:46 04/11/2017 11:57 04/11/2017 16:39 04/11/2017 20:18 04/12/2017 08:03  Glucose-Capillary Latest Ref Range: 65 - 99 mg/dL 267 (H) 229 (H) 146 (H) 136 (H) 182 (H)   Results for Kristi [name], Kristi [name] (MRN [id number]) as of 04/12/2017 09:27  Ref. Range 04/10/2017 23:04 04/10/2017 23:04 04/11/2017 01:20 04/11/2017 02:56 04/11/2017 06:00  Glucose-Capillary Latest Ref Range: 65 - 99 mg/dL >600 (HH) >600 (HH) 593 (HH) 416 (H) 183 (H)  Results for Kristi [name], Kristi [name] (MRN [id number]) as of 04/12/2017 09:27  Ref. Range 12/23/2016 21:46 12/25/2016 04:15 12/26/2016 04:02 04/10/2017 23:05 04/11/2017 09:04 04/12/2017 03:42  Glucose Latest Ref Range: 65 - 99 mg/dL 188 (H) 139 (H) 140 (H) 708 (HH) 248 (H) 221 (H)   Review of Glycemic Control  Diabetes history: No Outpatient Diabetes medications: NA Current orders for Inpatient glycemic control: Novolog 0-15 units TID with meals, Novolog 0-5 units QHS, Novolog 3 units TID with meals, Metformin 500 mg BID  Inpatient Diabetes Program Recommendations: Oral Agents: Please consider increasing Metformin to 1000 mg BID. If glucose continues to be >150 mg/dl once Metformin is increased, may want to consider adding another oral DM medication such as Amaryl 1 mg daily. HgbA1C: Please add on an A1C to blood in lab to evaluate glycemic control over the past 2-3 months. Outpatient Follow Up: Patient will need to follow up with PCP regarding DM control.  NOTE: In reviewing chart, noted patient was an inpatient in October 2018 and glucose was only mildly elevated on labs. Also noted patient has been having issues with leg pain and foot  pain. Question if patient has received any steroids recently. Will plan to follow up with patient today to discuss new DM dx further.  Addendum 04/12/17@14 :00-Spoke with patient about new diabetes diagnosis. Patient was seen by a Diabetes Coordinator on 04/11/17 but patient reports she does not remember talking to anyone about DM. Patient states that she has several family members with DM. Patient lives alone and has an aide that comes Monday-Friday from 9am to 1pm.  Inquired about any steroid injections or oral steroids taken recently. Patient states that the doctor at the pain clinic has been giving her steroid shots lately but she does not recall how long ago it was.  Noted progress note by Dr. Brunetta Genera on 03/06/17 that patient received Kenalog 40 mg (steroid injection). Discussed basic pathophysiology of DM Type 2, basic home care, importance of checking CBGs and maintaining good CBG control to prevent long-term and short-term complications. Reviewed glucose and A1C goals and explained that A1C is in process.  Reviewed signs and symptoms of hyperglycemia and hypoglycemia along with treatment for both. Patient endorses that she has had excessive urination and blurry vision over the past several weeks.  Discussed impact of nutrition, exercise, stress, sickness, and medications on diabetes control. Reviewed Living Well with diabetes booklet and encouraged patient to read through entire book.  Asked patient to check her glucose at least 2  times per day and to keep a log book of glucose readings.  Explained how the doctor she follows up with can use the log book  to continue to make adjustments with DM medications if needed. Informed patient that it is unclear what discharge DM medications will be at this time but will continue to follow and see again tomorrow if patient will discharge on insulin. Anticipate oral DM medications will be sufficient for glycemic control based on current glucose trends.  Patient  verbalized understanding of information discussed and she states that she has no further questions at this time related to diabetes.   RNs to provide ongoing basic DM education at bedside with this patient and engage patient to actively check blood glucose.   Thanks, Barnie Alderman, RN, MSN, CDE Diabetes Coordinator Inpatient Diabetes Program [phone] (Team Pager from 8am to 5pm)

## 2017-04-12 NOTE — Progress Notes (Signed)
Family Meeting Note  Advance Directive:yes  Today a meeting took place with the Patient.  Discussed with patient about healthcare living will, she wanted to be full code    The following clinical team members were present during this meeting:MD  The following were discussed:Patient's diagnosis: , Patient's progosis: Good at this time and Goals for treatment: Continue present management  Additional follow-up to be provided 'follow her regularly and make further recommendation. Time spent during discussion:15 minutes  Epifanio Lesches, MD

## 2017-04-12 NOTE — Progress Notes (Signed)
Pt supposedly have a xray of the esophagus done this afternoon. The order was placed at 4:50, but pt needs to be NPO 4 hours before the procedure. Radiologist called to call the docotr if they can do it tomorrow (04/13/17). Docotor Mody was notified. Awaiting call back. Will continue to monitor.

## 2017-04-12 NOTE — Progress Notes (Signed)
CCMD called and reported that pt was pacing before and after QRS. Doctor Vianne Bulls was notified .Pt only complaints was Short of breath. Oxygen was check and was at 96 to 98. Pt has no complaints at this time and is resting. Will continue to monitor.

## 2017-04-12 NOTE — Progress Notes (Signed)
Pt IV came out. Tried twice to start IV but unseccessful. Ask co- nurse to start IV. Will continue to monitor

## 2017-04-12 NOTE — Plan of Care (Signed)
  Progressing Clinical Measurements: Ability to maintain clinical measurements within normal limits will improve 04/12/2017 1140 - Progressing by Liliane Channel, RN Activity: Risk for activity intolerance will decrease 04/12/2017 1140 - Progressing by Liliane Channel, RN Pain Managment: General experience of comfort will improve 04/12/2017 1140 - Progressing by Liliane Channel, RN Safety: Ability to remain free from injury will improve 04/12/2017 1140 - Progressing by Liliane Channel, RN Metabolic: Ability to maintain appropriate glucose levels will improve 04/12/2017 1140 - Progressing by Liliane Channel, RN

## 2017-04-12 NOTE — Progress Notes (Signed)
Pt complained about eating but takes a while for it to go down to her stomach. Doctor Vianne Bulls was notified. Awaiting call. Will continue to monitor.

## 2017-04-12 NOTE — Progress Notes (Addendum)
[facility] [place] NAME: Kristi [name]    MR#:  [id number]  DATE OF BIRTH:  Apr 13, 1940  SUBJECTIVE:, Awake, oriented, patient complains of poor appetite today and appears slightly confused.  CHIEF COMPLAINT:   Chief Complaint  Patient presents with  . Loss of Consciousness  . Weakness    REVIEW OF SYSTEMS:   ROS CONSTITUTIONAL: No fever, fatigue or weakness.  EYES: No blurred or double vision.  EARS, NOSE, AND THROAT: No tinnitus or ear pain.  RESPIRATORY: No cough, shortness of breath, wheezing or hemoptysis.  CARDIOVASCULAR: No chest pain, orthopnea, edema.  GASTROINTESTINAL: No nausea, vomiting, diarrhea or abdominal pain.  GENITOURINARY: No dysuria, hematuria.  ENDOCRINE: No polyuria, nocturia,  HEMATOLOGY: No anemia, easy bruising or bleeding SKIN: No rash or lesion. MUSCULOSKELETAL shoulder pain bilaterally. NEUROLOGIC: No tingling, numbness, weakness.  PSYCHIATRY: No anxiety or depression.   DRUG ALLERGIES:   Allergies  Allergen Reactions  . Penicillins Anaphylaxis  . Penicillin G Swelling and Rash    Has patient had a PCN reaction causing immediate rash, facial/tongue/throat swelling, SOB or lightheadedness with hypotension: Yes Has patient had a PCN reaction causing severe rash involving mucus membranes or skin necrosis: No Has patient had a PCN reaction that required hospitalization: No Has patient had a PCN reaction occurring within the last 10 years: No If all of the above answers are "NO", then may proceed with Cephalosporin use.     VITALS:  Blood pressure (!) 109/55, pulse 63, temperature 98.2 F (36.8 C), temperature source Oral, resp. rate (!) 30, height 5\' 7"  (1.702 m), weight 82.6 kg (182 lb), SpO2 98 %.  PHYSICAL EXAMINATION:  GENERAL:  77 y.o.-year-old patient lying in the bed with no acute distress.  EYES: Pupils equal, round, reactive to light and . No scleral icterus. Extraocular muscles  intact.  HEENT: Head atraumatic, normocephalic. Oropharynx and nasopharynx clear.  NECK:  Supple, no jugular venous distention. No thyroid enlargement, no tenderness.  LUNGS: Normal breath sounds bilaterally, no wheezing, rales,rhonchi or crepitation. No use of accessory muscles of respiration.  CARDIOVASCULAR: S1, S2 normal. No murmurs, rubs, or gallops.  ABDOMEN: Soft, nontender, nondistended. Bowel sounds present. No organomegaly or mass.  EXTREMITIES: No pedal edema, cyanosis, or clubbing.  NEUROLOGIC: Cranial nerves II through XII are intact. Muscle strength 5/5 in all extremities. Sensation intact. Gait not checked.  PSYCHIATRIC: The patient is alert and oriented x 3.  SKIN: No obvious rash, lesion, or ulcer.    LABORATORY PANEL:   CBC Recent Labs  Lab 04/11/17 0904  WBC 6.7  HGB 11.0*  HCT 33.8*  PLT 95*   ------------------------------------------------------------------------------------------------------------------  Chemistries  Recent Labs  Lab 04/10/17 2305  04/12/17 0342  NA 129*   < > 137  K 3.8   < > 4.1  CL 87*   < > 107  CO2 30   < > 23  GLUCOSE 708*   < > 221*  BUN 19   < > 9  CREATININE 0.99   < > 0.75  CALCIUM 8.9   < > 7.9*  AST 49*  --   --   ALT 6*  --   --   ALKPHOS 151*  --   --   BILITOT 0.7  --   --    < > = values in this interval not displayed.   ------------------------------------------------------------------------------------------------------------------  Cardiac Enzymes Recent Labs  Lab 04/11/17 1811  TROPONINI 0.05*   ------------------------------------------------------------------------------------------------------------------  RADIOLOGY:  Ct Head Wo Contrast  Result Date: 04/11/2017 CLINICAL DATA:  Syncope and weakness EXAM: CT HEAD WITHOUT CONTRAST CT CERVICAL SPINE WITHOUT CONTRAST TECHNIQUE: Multidetector CT imaging of the head and cervical spine was performed following the standard protocol without intravenous  contrast. Multiplanar CT image reconstructions of the cervical spine were also generated. COMPARISON:  Head CT 12/23/2016 FINDINGS: CT HEAD FINDINGS Brain: No mass lesion, intraparenchymal hemorrhage or extra-axial collection. No evidence of acute cortical infarct. There is periventricular hypoattenuation compatible with chronic microvascular disease. Vascular: No hyperdense vessel or unexpected calcification. Skull: Normal visualized skull base, calvarium and extracranial soft tissues. Sinuses/Orbits: No sinus fluid levels or advanced mucosal thickening. No mastoid effusion. Normal orbits. CT CERVICAL SPINE FINDINGS Alignment: Grade 1 retrolisthesis at C3-C4. Grade 1 anterolisthesis at C4-C5 and C7-T1. Skull base and vertebrae: No acute fracture. Soft tissues and spinal canal: No prevertebral fluid or swelling. No visible canal hematoma. Disc levels: Multilevel uncovertebral hypertrophy greatest at C5-C6 and C6-C7. No bony spinal canal stenosis. Multilevel facet hypertrophy, greatest at C7-T1 and C4-C5. Moderate-to-severe left C4-5 foraminal stenosis. Upper chest: No pneumothorax, pulmonary nodule or pleural effusion. Other: Normal visualized paraspinal cervical soft tissues. IMPRESSION: 1. Chronic ischemic microangiopathy without acute intracranial abnormality. 2. No acute fracture of the cervical spine. 3. Multilevel degenerative disc disease and facet arthrosis with degenerative grade 1 spondylolisthesis at C3-4, C4-5 and C7-T1. Electronically Signed   By: Ulyses Jarred M.D.   On: 04/11/2017 01:12   Ct Cervical Spine Wo Contrast  Result Date: 04/11/2017 CLINICAL DATA:  Syncope and weakness EXAM: CT HEAD WITHOUT CONTRAST CT CERVICAL SPINE WITHOUT CONTRAST TECHNIQUE: Multidetector CT imaging of the head and cervical spine was performed following the standard protocol without intravenous contrast. Multiplanar CT image reconstructions of the cervical spine were also generated. COMPARISON:  Head CT 12/23/2016  FINDINGS: CT HEAD FINDINGS Brain: No mass lesion, intraparenchymal hemorrhage or extra-axial collection. No evidence of acute cortical infarct. There is periventricular hypoattenuation compatible with chronic microvascular disease. Vascular: No hyperdense vessel or unexpected calcification. Skull: Normal visualized skull base, calvarium and extracranial soft tissues. Sinuses/Orbits: No sinus fluid levels or advanced mucosal thickening. No mastoid effusion. Normal orbits. CT CERVICAL SPINE FINDINGS Alignment: Grade 1 retrolisthesis at C3-C4. Grade 1 anterolisthesis at C4-C5 and C7-T1. Skull base and vertebrae: No acute fracture. Soft tissues and spinal canal: No prevertebral fluid or swelling. No visible canal hematoma. Disc levels: Multilevel uncovertebral hypertrophy greatest at C5-C6 and C6-C7. No bony spinal canal stenosis. Multilevel facet hypertrophy, greatest at C7-T1 and C4-C5. Moderate-to-severe left C4-5 foraminal stenosis. Upper chest: No pneumothorax, pulmonary nodule or pleural effusion. Other: Normal visualized paraspinal cervical soft tissues. IMPRESSION: 1. Chronic ischemic microangiopathy without acute intracranial abnormality. 2. No acute fracture of the cervical spine. 3. Multilevel degenerative disc disease and facet arthrosis with degenerative grade 1 spondylolisthesis at C3-4, C4-5 and C7-T1. Electronically Signed   By: Ulyses Jarred M.D.   On: 04/11/2017 01:12   Ct Abdomen Pelvis W Contrast  Result Date: 04/11/2017 CLINICAL DATA:  Syncopal episode tonight. Weakness. Excessive thirst and urination. Abdominal pain, nausea and vomiting. EXAM: CT ABDOMEN AND PELVIS WITH CONTRAST TECHNIQUE: Multidetector CT imaging of the abdomen and pelvis was performed using the standard protocol following bolus administration of intravenous contrast. CONTRAST:  184mL ISOVUE-300 IOPAMIDOL (ISOVUE-300) INJECTION 61% COMPARISON:  None. FINDINGS: Lower chest: Lung bases are clear. Cardiac enlargement.  Moderate-sized esophageal hiatal hernia. Hepatobiliary: Focal low-attenuation lesion centrally in segment 4 of the liver measuring 2.1 cm diameter.  This appears to demonstrate some fill in on the delayed imaging, indicating that it may indicate a hemangioma. However, motion artifact limits the evaluation. Consider follow-up ultrasound for further correlation. No other focal lesions identified. Gallbladder and bile ducts are unremarkable. Pancreas: Unremarkable. No pancreatic ductal dilatation or surrounding inflammatory changes. Spleen: Normal in size without focal abnormality. Adrenals/Urinary Tract: No adrenal gland nodules. Kidneys are symmetrical and nephrograms are homogeneous. No hydronephrosis or hydroureter and no stones are demonstrated. The both ureters demonstrate evidence of wall thickening, greater on the left. This may indicate pyelonephritis. Bladder wall is not significantly thickened and no bladder filling defects are demonstrated. Stomach/Bowel: Stomach is within normal limits. Appendix appears normal. No evidence of bowel wall thickening, distention, or inflammatory changes. Diverticula in the sigmoid colon without evidence of diverticulitis. Vascular/Lymphatic: Aortic atherosclerosis. No enlarged abdominal or pelvic lymph nodes. Reproductive: Status post hysterectomy. No adnexal masses. Other: No abdominal wall hernia or abnormality. No abdominopelvic ascites. Musculoskeletal: Degenerative changes in the spine. Mild anterior subluxation of L4 on L5, likely degenerative. Postoperative changes in the right proximal femur. IMPRESSION: 1. Nonspecific focal low-attenuation lesion in segment 4 of the liver measuring 2.1 cm diameter. Possible hemangioma. Consider ultrasound for further characterization. 2. Wall thickening in the ureters, greater on the left. This may indicate pyelonephritis. Bladder is unremarkable. 3. Moderate esophageal hiatal hernia. 4. Aortic atherosclerosis. Electronically Signed    By: Lucienne Capers M.D.   On: 04/11/2017 01:23    EKG:   Orders placed or performed during the hospital encounter of 04/10/17  . ED EKG  . ED EKG  . EKG 12-Lead  . EKG 12-Lead    ASSESSMENT AND PLAN:   #1 /syncope secondary  Hypotension: Hypotension resolved, get physical therapy consulted today, echo showed EF more than 60%.  2.  New onset diabetes mellitus type 2 with severe hyperglycemia blood sugar of 700 in the emergency room.  Discussed with the patient about diagnosis of diabetes.  She told me that all her family members have diabetes.  And she is not surprised.  Continue metformin, insulin with sliding scale coverage.  Check hemoglobin A1c . 3.  Hyponatremia, hypokalemia secondary to dehydration, ; improving,ADJUST  IV fluids. 4.  Severe hypotension: Resolved.  Resume beta-blockers from tomorrow continue to hold Lasix because of ,. 5.  Slightly elevated troponins likely demand ischemia: Echocardiogram showed EF more than 60%  6.  History of CAD, CABG, PPM.  Follows up with Dr. Lavera Guise CAD medicines on hold because of hypotension.  Start beta-blockers at the lower dose from tomorrow. History of Parkinson disease: Continue carbidopa/levodopa. 7.  History of CAD: CABG.  Continue aspirin, Plavix, statins #8. E. coli UTI: Started on Rocephin.  Follow sensitivity results.   All the records are reviewed and case discussed with Care Management/Social Workerr. Management plans discussed with the patient, family and they are in agreement.  CODE STATUS: full  TOTAL TIME TAKING CARE OF THIS PATIENT: 35 minutes.   POSSIBLE D/C IN 1-2 DAYS, DEPENDING ON CLINICAL CONDITION.   Epifanio Lesches M.D on 04/12/2017 at 1:31 PM  Between 7am to 6pm - Pager - [phone]  After 6pm go to [email or web address] - password [email or web address] [facility]  Office  [phone]  CC: Primary care physician; Lorelee Market, MD   Note: This dictation was prepared with Dragon  dictation along with smaller phrase technology. Any transcriptional errors that result from this process are unintentional.

## 2017-04-13 ENCOUNTER — Inpatient Hospital Stay: Payer: Medicare Other

## 2017-04-13 DIAGNOSIS — R933 Abnormal findings on diagnostic imaging of other parts of digestive tract: Secondary | ICD-10-CM

## 2017-04-13 LAB — HEMOGLOBIN A1C
Hgb A1c MFr Bld: 12.3 % — ABNORMAL HIGH (ref 4.8–5.6)
Mean Plasma Glucose: 306.31 mg/dL

## 2017-04-13 LAB — GLUCOSE, CAPILLARY
Glucose-Capillary: 182 mg/dL — ABNORMAL HIGH (ref 65–99)
Glucose-Capillary: 252 mg/dL — ABNORMAL HIGH (ref 65–99)
Glucose-Capillary: 140 mg/dL — ABNORMAL HIGH (ref 65–99)
Glucose-Capillary: 222 mg/dL — ABNORMAL HIGH (ref 65–99)

## 2017-04-13 LAB — URINE CULTURE: Culture: >=100000 — AB

## 2017-04-13 MED ORDER — MECLIZINE HCL 12.5 MG PO TABS
12.5000 mg | ORAL_TABLET | Freq: Three times a day (TID) | ORAL | Status: AC
  Administered 2017-04-13 – 2017-04-14 (×3): 12.5 mg via ORAL
  Filled 2017-04-13 (×3): qty 1

## 2017-04-13 MED ORDER — MEGESTROL ACETATE 400 MG/10ML PO SUSP
400.0000 mg | Freq: Every day | ORAL | Status: DC
  Administered 2017-04-13 – 2017-04-17 (×5): 400 mg via ORAL
  Filled 2017-04-13 (×6): qty 10

## 2017-04-13 MED ORDER — CARVEDILOL 3.125 MG PO TABS
3.1250 mg | ORAL_TABLET | Freq: Two times a day (BID) | ORAL | Status: DC
  Administered 2017-04-13 – 2017-04-17 (×7): 3.125 mg via ORAL
  Filled 2017-04-13 (×7): qty 1

## 2017-04-13 MED ORDER — GLIMEPIRIDE 1 MG PO TABS
1.0000 mg | ORAL_TABLET | Freq: Every day | ORAL | Status: DC
  Administered 2017-04-14 – 2017-04-17 (×4): 1 mg via ORAL
  Filled 2017-04-13 (×5): qty 1

## 2017-04-13 NOTE — Progress Notes (Addendum)
[facility] at [place] NAME: Kristi [name]    MR#:  [id number]  DATE OF BIRTH:  [birth year]  SUBJECTIVE:  CHIEF COMPLAINT:   Chief Complaint  Patient presents with  . Loss of Consciousness  . Weakness  Complains of poor appetite, chronic dizziness for numerous months, abnormal esophagram noted-recommended direct visualization, gastroenterology to evaluate for possible EGD, PT note reviewed-recommending skilled nursing facility, patient not interested in placement  REVIEW OF SYSTEMS:  CONSTITUTIONAL: No fever, fatigue or weakness.  EYES: No blurred or double vision.  EARS, NOSE, AND THROAT: No tinnitus or ear pain.  RESPIRATORY: No cough, shortness of breath, wheezing or hemoptysis.  CARDIOVASCULAR: No chest pain, orthopnea, edema.  GASTROINTESTINAL: No nausea, vomiting, diarrhea or abdominal pain.  GENITOURINARY: No dysuria, hematuria.  ENDOCRINE: No polyuria, nocturia,  HEMATOLOGY: No anemia, easy bruising or bleeding SKIN: No rash or lesion. MUSCULOSKELETAL: No joint pain or arthritis.   NEUROLOGIC: No tingling, numbness, weakness.  PSYCHIATRY: No anxiety or depression.   ROS  DRUG ALLERGIES:   Allergies  Allergen Reactions  . Penicillins Anaphylaxis  . Penicillin G Swelling and Rash    Has patient had a PCN reaction causing immediate rash, facial/tongue/throat swelling, SOB or lightheadedness with hypotension: Yes Has patient had a PCN reaction causing severe rash involving mucus membranes or skin necrosis: No Has patient had a PCN reaction that required hospitalization: No Has patient had a PCN reaction occurring within the last 10 years: No If all of the above answers are "NO", then may proceed with Cephalosporin use.     VITALS:  Blood pressure (!) 159/71, pulse 71, temperature 98.1 F (36.7 C), temperature source Oral, resp. rate 18, height 5\' 7"  (1.702 m), weight 81.6 kg (180 lb), SpO2 96 %.  PHYSICAL EXAMINATION:   GENERAL:  77 y.o.-year-old patient lying in the bed with no acute distress.  EYES: Pupils equal, round, reactive to light and accommodation. No scleral icterus. Extraocular muscles intact.  HEENT: Head atraumatic, normocephalic. Oropharynx and nasopharynx clear.  NECK:  Supple, no jugular venous distention. No thyroid enlargement, no tenderness.  LUNGS: Normal breath sounds bilaterally, no wheezing, rales,rhonchi or crepitation. No use of accessory muscles of respiration.  CARDIOVASCULAR: S1, S2 normal. No murmurs, rubs, or gallops.  ABDOMEN: Soft, nontender, nondistended. Bowel sounds present. No organomegaly or mass.  EXTREMITIES: No pedal edema, cyanosis, or clubbing.  NEUROLOGIC: Cranial nerves II through XII are intact. MAES. Gait not checked.  PSYCHIATRIC: The patient is alert and oriented x 3.  SKIN: No obvious rash, lesion, or ulcer.   Physical Exam LABORATORY PANEL:   CBC Recent Labs  Lab 04/11/17 0904  WBC 6.7  HGB 11.0*  HCT 33.8*  PLT 95*   ------------------------------------------------------------------------------------------------------------------  Chemistries  Recent Labs  Lab 04/10/17 2305  04/12/17 0342  NA 129*   < > 137  K 3.8   < > 4.1  CL 87*   < > 107  CO2 30   < > 23  GLUCOSE 708*   < > 221*  BUN 19   < > 9  CREATININE 0.99   < > 0.75  CALCIUM 8.9   < > 7.9*  AST 49*  --   --   ALT 6*  --   --   ALKPHOS 151*  --   --   BILITOT 0.7  --   --    < > = values in this interval not displayed.   ------------------------------------------------------------------------------------------------------------------  Cardiac  Enzymes Recent Labs  Lab 04/11/17 1230 04/11/17 1811  TROPONINI 0.05* 0.05*   ------------------------------------------------------------------------------------------------------------------  RADIOLOGY:  Dg Esophagus  Result Date: 04/13/2017 CLINICAL DATA:  The patient reports increasing shortness of breath at rest and  with exertion over the past 2 months with peripheral edema. The patient reports dysphagia for 1 month. History of esophageal dilation 10 years ago. EXAM: ESOPHOGRAM / BARIUM SWALLOW / BARIUM TABLET STUDY TECHNIQUE: Combined double contrast and single contrast examination performed using thick and thin barium liquid. The patient was observed with fluoroscopy swallowing a 13 mm barium sulphate tablet. Effervescent crystals were not administered due to the fact the study had to be performed with the patient semi supine and supine. FLUOROSCOPY TIME:  Fluoroscopy Time:  1 minute, 36 seconds Radiation Exposure Index (if provided by the fluoroscopic device): 916 micro Gy per meters square Number of Acquired Spot Images: 0 COMPARISON:  Abdominal and pelvic CT scan of 11 April 2017 FINDINGS: The patient ingested thick and thin barium without difficulty. The cervical esophagus was normal in a survey fashion. No laryngeal penetration of the barium was observed. The thoracic esophagus distended well down to its distal third. There there was a small non reducible hiatal hernia. The distal esophagus relaxed reasonably well and allowed passage of liquid barium into the hiatal hernia. However, the channel through the esophageal hiatus was narrowed and the caliber of the channel of communication between the hiatal hernia and the intra-abdominal portion of the stomach was persistently narrowed. The patient ingested the barium tablet which hung in the distal esophagus just proximal to the hiatal hernia and had to be allowed to dissolve. No evidence of ulceration or mass was observed. IMPRESSION: Moderate-sized non reducible hiatal hernia. The portion of the hiatal hernia-stomach within the esophageal hiatus is narrowed. No definite volvulus is observed. Mild narrowing of the distal esophagus just proximal to the hiatal hernia. The barium tablet hung in this region and was allowed to dissolve. Direct visualization is recommended.  Repeat dilation may be indicated. Electronically Signed   By: David  Martinique M.D.   On: 04/13/2017 07:58    ASSESSMENT AND PLAN:  1  acute syncope Resolved Secondary to hypotension Physical therapy input appreciated-recommending skilled nursing facility placement though patient is not interested in placement Echo shown EF > 60%, carotid Dopplers from August 2018 was a negative study, orthostatics negative  2 new onset diabetes mellitus type 2 with severe hyperglycemia blood sugar of 700 Stable on current regiment Continue metformin, SSI, follow-up on hemoglobin A1c, and diabetic educator consulted while in house  3 acute hyponatremia, hypokalemia Secondary to dehydration/poor p.o. intake Much improved, nearly resolved  4 acute severe hypotension Resolved Coreg initially held-restarted on April 13, 2017 at reduced rate, Lasix discontinued given poor p.o. Intake  5 acute elevated troponins Most likely demand ischemia Echocardiogram showed EF more than 60% No symptomatology of acute coronary syndrome, conservative management/observation  6 hx of CAD, CABG, PPM Follows Dr. Lavera Guise Restart beta-blocker therapy as stated above, aspirin, Plavix, statin therapy  7 chronic Parkinson disease Stable on carbidopa/levodopa . 8 acute E. coli UTI Rocephin IV for 3-day course, sensitivities noted  9 acute abnormal esophagram/barium swallow study Noted for distal esophageal narrowing with hiatal hernia, recommending direct visualization Gastroenterology consulted for EGD evaluation  All the records are reviewed and case discussed with Care Management/Social Workerr. Management plans discussed with the patient, family and they are in agreement.  CODE STATUS: full TOTAL TIME TAKING CARE OF THIS PATIENT: 67  minutes.     POSSIBLE D/C IN 1-2 DAYS, DEPENDING ON CLINICAL CONDITION.   Avel Peace Khaled Herda M.D on 04/13/2017   Between 7am to 6pm - Pager - [phone]  After 6pm go to  [email or web address] - password [email or web address] [facility]  Office  [phone]  CC: Primary care physician; Lorelee Market, MD  Note: This dictation was prepared with Dragon dictation along with smaller phrase technology. Any transcriptional errors that result from this process are unintentional.

## 2017-04-13 NOTE — Evaluation (Signed)
Physical Therapy Evaluation Patient Details Name: Kristi [name] MRN: [id number] DOB: 05-11-1940 Today's Date: 04/13/2017   History of Present Illness  Pt admitted s/p fall.  PMH includes spinal stenosis, PD, neck pain, Htn, HLD, broken arm, depression, CAD and anxiety.  Clinical Impression  Pt is a 77 year old female who lives in a mobile home alone.  She reported no pain upon arrival but stated that she has been feeling dizzy and appears lethargic.  Pt vitals all tested WNL.  Pt sat at EOB for 5 min to speak with physician and began to lean back due to pt reported fatigue.  Pt was able to perform bed mobility with increased time and use of bed rail.  PT provided mod A for initiation of STS and pt required several attemps before standing upright.  PT provided VC's for safe use of RW and upright posture.  Pt ambulated in room 20 ft with RW, CGA.  She demonstrated general weakness of UE and LE and reported no sensation deficits.  Pt will continue to benefit from skilled PT with focus on strength, tolerance to activity, functional mobility and safe use of AD.    Follow Up Recommendations SNF    Equipment Recommendations       Recommendations for Other Services       Precautions / Restrictions Precautions Precautions: Fall Restrictions Weight Bearing Restrictions: No      Mobility  Bed Mobility Overal bed mobility: Modified Independent             General bed mobility comments: Pt requires increased time but is able to perform bed mobility with use of bed rail  Transfers Overall transfer level: Needs assistance Equipment used: Rolling walker (2 wheeled) Transfers: Sit to/from Stand Sit to Stand: Mod assist         General transfer comment: Pt required multiple attempts and mod A for initiation of STS transfer.  Pt reported that she felt dizzy at this time.  BP reading 146/93.  Ambulation/Gait Ambulation/Gait assistance: Min guard Ambulation Distance (Feet): 15  Feet Assistive device: Rolling walker (2 wheeled)     Gait velocity interpretation: Below normal speed for age/gender General Gait Details: Pt able to ambulate in room with RW and VC's for upright posture and keeping RW close in proximity to pt's body for fall prevention.  Stairs            Wheelchair Mobility    Modified Rankin (Stroke Patients Only)       Balance Overall balance assessment: Modified Independent                                           Pertinent Vitals/Pain Pain Assessment: No/denies pain    Home Living Family/patient expects to be discharged to:: Private residence Living Arrangements: Alone Available Help at Discharge: Personal care attendant;Available PRN/intermittently Type of Home: Mobile home Home Access: Ramped entrance     Home Layout: One level Home Equipment: [place] - 2 wheels;Cane - single point;Wheelchair - manual;Toilet riser      Prior Function Level of Independence: Needs assistance   Gait / Transfers Assistance Needed: Pt has been using a RW for the past 3 months.  ADL's / Homemaking Assistance Needed: Pt states that she does some of her bathing and dressing and that someone comes into her home to help her for 4 hrs/day.  Hand Dominance        Extremity/Trunk Assessment   Upper Extremity Assessment Upper Extremity Assessment: Generalized weakness    Lower Extremity Assessment Lower Extremity Assessment: Generalized weakness    Cervical / Trunk Assessment Cervical / Trunk Assessment: Kyphotic  Communication   Communication: HOH  Cognition Arousal/Alertness: Lethargic Behavior During Therapy: WFL for tasks assessed/performed Overall Cognitive Status: Difficult to assess                                 General Comments: Pt appeared to be lethargic and stated that she felt dizzy.      General Comments      Exercises     Assessment/Plan    PT Assessment Patient needs  continued PT services  PT Problem List Decreased strength;Decreased mobility;Decreased activity tolerance;Decreased balance;Decreased cognition;Decreased knowledge of use of DME;Decreased safety awareness       PT Treatment Interventions DME instruction;Therapeutic activities;Gait training;Therapeutic exercise;Stair training;Balance training;Functional mobility training;Patient/family education    PT Goals (Current goals can be found in the Care Plan section)  Acute Rehab PT Goals Patient Stated Goal: To regain strength to return home as soon as possible.   PT Goal Formulation: With patient Time For Goal Achievement: 04/27/17 Potential to Achieve Goals: Good    Frequency Min 2X/week   Barriers to discharge        Co-evaluation               AM-PAC PT "6 Clicks" Daily Activity  Outcome Measure Difficulty turning over in bed (including adjusting bedclothes, sheets and blankets)?: A Little Difficulty moving from lying on back to sitting on the side of the bed? : A Little Difficulty sitting down on and standing up from a chair with arms (e.g., wheelchair, bedside commode, etc,.)?: A Little Help needed moving to and from a bed to chair (including a wheelchair)?: A Little Help needed walking in hospital room?: A Little Help needed climbing 3-5 steps with a railing? : A Little 6 Click Score: 18    End of Session Equipment Utilized During Treatment: Gait belt Activity Tolerance: Patient limited by lethargy Patient left: in chair;with call bell/phone within reach;with chair alarm set;with nursing/sitter in room Nurse Communication: (Pt in chair, reports feeling dizzy, needs ext cath reapplied) PT Visit Diagnosis: Unsteadiness on feet (R26.81);Muscle weakness (generalized) (M62.81);History of falling (Z91.81)    Time: 1100-1120 PT Time Calculation (min) (ACUTE ONLY): 20 min   Charges:     PT Treatments $Therapeutic Activity: 8-22 mins   PT G Codes:   PT G-Codes **NOT FOR  INPATIENT CLASS** Functional Assessment Tool Used: AM-PAC 6 Clicks Basic Mobility    Roxanne Gates, PT, DPT   Roxanne Gates 04/13/2017, 12:09 PM

## 2017-04-13 NOTE — Consult Note (Signed)
Jonathon Bellows , MD [address], [place], [place], [place], [address] [address], [place], [place], [place], [address] Phone: [phone]  Fax: [phone]  Consultation  Referring Provider:   Dr Jerelyn Charles  Primary Care Physician:  Lorelee Market, MD Primary Gastroenterologist: None        Reason for Consultation:     Abnormal barium study   Date of Admission:  04/10/2017 Date of Consultation:  04/13/2017         HPI:   Kristi [name] is a 77 y.o. female admitted on 03/11/18 with weakness, a fall  . On admission was hypotensive, elevated troponins , ecoli uti , elevated blood sugars.On asprin and Plavix.   A barium swallow was performed for dysphagia today which noted a non reducible hiatal hernia. Showed narrowing in the distal esophagus.  .   She says she has had difficulty swallowing for the past 6 months , affecting solids and liquids, getting worse, denies any weight loss or heartburn.   Past Medical History:  Diagnosis Date  . Anxiety   . Broken arm    left FA 3/17  . Broken arm    left  . CAD (coronary artery disease)    s/p MI  . Depression   . Hyperlipidemia   . Hypertension   . Hypothyroid   . Neck pain 06/04/2014  . Parkinson's disease ([facility])   . Spinal stenosis     Past Surgical History:  Procedure Laterality Date  . ABDOMINAL HYSTERECTOMY    . CORONARY ARTERY BYPASS GRAFT    . CORONARY STENT PLACEMENT    . s/p pacer insertion      Prior to Admission medications   Medication Sig Start Date End Date Taking? Authorizing Provider  aspirin EC 81 MG tablet Take 81 mg by mouth daily.   Yes [provider]  carbidopa-levodopa (SINEMET IR) 25-100 MG tablet Take by mouth. 03/08/17  Yes [provider]  cholecalciferol (VITAMIN D) 1000 UNITS tablet Take 1,000 Units by mouth daily. Reported on 07/08/2015   Yes [provider]  docusate sodium (COLACE) 100 MG capsule Take 100 mg by mouth 2 (two) times daily.   Yes [provider]  fluticasone (FLONASE) 50 MCG/ACT nasal spray Place 2 sprays into both nostrils daily.   Yes [provider]  furosemide (LASIX) 40 MG tablet  11/30/16  Yes [provider]  gabapentin (NEURONTIN) 300 MG capsule Take 1 capsule (300 mg total) by mouth 3 (three) times daily. Patient taking differently: Take 300 mg by mouth at bedtime.  11/09/16 04/11/17 Yes Vevelyn Francois, NP  levothyroxine (SYNTHROID, LEVOTHROID) 75 MCG tablet  03/23/17  Yes [provider]  Linaclotide (LINZESS) 145 MCG CAPS capsule Take 145 mcg by mouth daily.   Yes [provider]  loratadine (CLARITIN) 10 MG tablet Take 10 mg by mouth daily.   Yes [provider]  Multiple Vitamins-Minerals (ALIVE WOMENS ENERGY PO) Take 1 tablet by mouth daily.   Yes [provider]  mupirocin ointment (BACTROBAN) 2 % Place 1 application into the nose 2 (two) times daily. 04/04/17  Yes Edrick Kins, DPM  nitroGLYCERIN (NITROSTAT) 0.4 MG SL tablet Place 0.4 mg under the tongue every 5 (five) minutes as needed for chest pain.  10/03/16  Yes [provider]  olopatadine (PATANOL) 0.1 % ophthalmic solution 1 drop 2 (two) times daily.   Yes [provider]  omeprazole (PRILOSEC) 20 MG capsule Take 20 mg by  mouth daily.   Yes [provider]  oxyCODONE-acetaminophen (PERCOCET/ROXICET) 5-325 MG tablet Take 1 tablet by mouth every 6 (six) hours as needed for severe pain.   Yes [provider]  potassium chloride SA (K-DUR,KLOR-CON) 20 MEQ tablet Take 20 mEq by mouth 2 (two) times daily.  08/18/16  Yes [provider]  rosuvastatin (CRESTOR) 20 MG tablet Take 20 mg by mouth daily.   Yes [provider]  sertraline (ZOLOFT) 100 MG tablet Take 50-100 mg by mouth 2 (two) times daily. Take 100 mg in the am and 50 mg at bedtime.   Yes [provider]  traMADol-acetaminophen (ULTRACET) 37.5-325 MG tablet Take 1 tablet by mouth every 6  (six) hours as needed for pain.   Yes [provider]  traZODone (DESYREL) 50 MG tablet Take 50 mg by mouth at bedtime as needed for sleep.    Yes [provider]  vitamin B-12 (CYANOCOBALAMIN) 500 MCG tablet Take 500 mcg by mouth daily.    Yes [provider]  carvedilol (COREG) 6.25 MG tablet  12/08/16   [provider]  clonazePAM (KLONOPIN) 0.5 MG tablet Take 0.5 mg by mouth 2 (two) times daily.    [provider]  clopidogrel (PLAVIX) 75 MG tablet  11/17/16   [provider]  lamoTRIgine (LAMICTAL) 100 MG tablet  11/23/16   [provider]  levothyroxine (SYNTHROID, LEVOTHROID) 100 MCG tablet Take 100 mcg by mouth daily before breakfast.    [provider]  NONFORMULARY OR COMPOUNDED ITEM [facility]  Peripheral Neuropathy Cream- Bupivacaine 1%, Doxepin 3%, Gabapentin 6%, Pentoxifylline 3%, Topiramate 1% Apply 1-2 grams to affected area 3-4 times daily Qty. 120 gm 3 refills Patient not taking: Reported on 03/06/2017 01/31/17   Edrick Kins, DPM  Olopatadine HCl 0.2 % SOLN  11/02/16   [provider]  thiothixene (NAVANE) 5 MG capsule Take 5 mg by mouth 2 (two) times daily. 08/09/16   [provider]  tiZANidine (ZANAFLEX) 2 MG tablet Take 1 tablet (2 mg total) by mouth at bedtime as needed for muscle spasms. Patient not taking: Reported on 04/11/2017 03/06/17   Gillis Santa, MD    Family History  Problem Relation Age of Onset  . Cancer Mother   . Depression Mother   . Diabetes Mother   . Hypertension Mother   . Heart disease Father      Social History   Tobacco Use  . Smoking status: Never Smoker  . Smokeless tobacco: Never Used  Substance Use Topics  . Alcohol use: No  . Drug use: No    Allergies as of 04/10/2017 - Review Complete 04/10/2017  Allergen Reaction Noted  . Penicillins Anaphylaxis 08/21/2013  . Penicillin g Swelling and Rash 07/03/2014    Review of Systems:      All systems reviewed and negative except where noted in HPI.   Physical Exam:  Vital signs in last 24 hours: Temp:  [98 F (36.7 C)-99.3 F (37.4 C)] 98.1 F (36.7 C) (01/24 0840) Pulse Rate:  [58-71] 71 (01/24 1023) Resp:  [18] 18 (01/24 0840) BP: (106-159)/(61-82) 159/71 (01/24 1023) SpO2:  [96 %-100 %] 96 % (01/24 0840) Weight:  [180 lb (81.6 kg)] 180 lb (81.6 kg) (01/24 0418) Last BM Date: 04/13/17 General:   Pleasant, cooperative in NAD Head:  Normocephalic and atraumatic. Eyes:   No icterus.   Conjunctiva pink. PERRLA. Ears:  Normal auditory acuity. Neck:  Supple; no masses or thyroidomegaly Lungs:  Respirations even and unlabored. Lungs clear to auscultation bilaterally.   No wheezes, crackles, or rhonchi.  Heart:  Regular rate and rhythm;  Without murmur, clicks, rubs or gallops Abdomen:  Soft, nondistended, nontender. Normal bowel sounds. No appreciable masses or hepatomegaly.  No rebound or guarding.  Neurologic:  Alert and oriented x3;  grossly normal neurologically. Skin:  Intact without significant lesions or rashes. Cervical Nodes:  No significant cervical adenopathy. Psych:  Alert and cooperative. Normal affect.  LAB RESULTS: Recent Labs    04/10/17 2305 04/11/17 0904  WBC 7.6 6.7  HGB 12.4 11.0*  HCT 37.9 33.8*  PLT 96* 95*   BMET Recent Labs    04/10/17 2305 04/11/17 0904 04/12/17 0342  NA 129* 133* 137  K 3.8 3.1* 4.1  CL 87* 99* 107  CO2 30 25 23   GLUCOSE 708* 248* 221*  BUN 19 11 9   CREATININE 0.99 0.77 0.75  CALCIUM 8.9 7.9* 7.9*   LFT Recent Labs    04/10/17 2305  PROT 6.7  ALBUMIN 3.4*  AST 49*  ALT 6*  ALKPHOS 151*  BILITOT 0.7  BILIDIR 0.1  IBILI 0.6   PT/INR No results for input(s): LABPROT, INR in the last 72 hours.  STUDIES: Dg Esophagus  Result Date: 04/13/2017 CLINICAL DATA:  The patient reports increasing shortness of breath at rest and with exertion over the past 2 months with peripheral edema. The patient reports  dysphagia for 1 month. History of esophageal dilation 10 years ago. EXAM: ESOPHOGRAM / BARIUM SWALLOW / BARIUM TABLET STUDY TECHNIQUE: Combined double contrast and single contrast examination performed using thick and thin barium liquid. The patient was observed with fluoroscopy swallowing a 13 mm barium sulphate tablet. Effervescent crystals were not administered due to the fact the study had to be performed with the patient semi supine and supine. FLUOROSCOPY TIME:  Fluoroscopy Time:  1 minute, 36 seconds Radiation Exposure Index (if provided by the fluoroscopic device): 916 micro Gy per meters square Number of Acquired Spot Images: 0 COMPARISON:  Abdominal and pelvic CT scan of 11 April 2017 FINDINGS: The patient ingested thick and thin barium without difficulty. The cervical esophagus was normal in a survey fashion. No laryngeal penetration of the barium was observed. The thoracic esophagus distended well down to its distal third. There there was a small non reducible hiatal hernia. The distal esophagus relaxed reasonably well and allowed passage of liquid barium into the hiatal hernia. However, the channel through the esophageal hiatus was narrowed and the caliber of the channel of communication between the hiatal hernia and the intra-abdominal portion of the stomach was persistently narrowed. The patient ingested the barium tablet which hung in the distal esophagus just proximal to the hiatal hernia and had to be allowed to dissolve. No evidence of ulceration or mass was observed. IMPRESSION: Moderate-sized non reducible hiatal hernia. The portion of the hiatal hernia-stomach within the esophageal hiatus is narrowed. No definite volvulus is observed. Mild narrowing of the distal esophagus just proximal to the hiatal hernia. The barium tablet hung in this region and was allowed to dissolve. Direct visualization is recommended. Repeat dilation may be indicated. Electronically Signed   By: David  Martinique M.D.    On: 04/13/2017 07:58      Impression / Plan:   Kristi [name] is a 77 y.o. y/o female with dysphagia on Plavix . I have been consulted for an abnormal barium study which shows narrowing in the distal esophagus. This warrants evaluation .For an  endoscopy to be performed with biopsies , she would need cardiology clearance and in addition would need plavix to be held for 5 days.     Thank you for involving me in the care of this patient.      LOS: 2 days   Jonathon Bellows, MD  04/13/2017, 3:32 PM

## 2017-04-14 LAB — GLUCOSE, CAPILLARY
Glucose-Capillary: 191 mg/dL — ABNORMAL HIGH (ref 65–99)
Glucose-Capillary: 260 mg/dL — ABNORMAL HIGH (ref 65–99)
Glucose-Capillary: 86 mg/dL (ref 65–99)
Glucose-Capillary: 241 mg/dL — ABNORMAL HIGH (ref 65–99)

## 2017-04-14 NOTE — Progress Notes (Signed)
Inpatient Diabetes Program Recommendations  AACE/ADA: New Consensus Statement on Inpatient Glycemic Control (2015)  Target Ranges:  Prepandial:   less than 140 mg/dL      Peak postprandial:   less than 180 mg/dL (1-2 hours)      Critically ill patients:  140 - 180 mg/dL   Lab Results  Component Value Date   GLUCAP 191 (H) 04/14/2017   HGBA1C 12.3 (H) 04/12/2017    Review of Glycemic Control  Results for AUTYM, SIESS (MRN [id number]) as of 04/14/2017 08:44  Ref. Range 04/13/2017 07:48 04/13/2017 11:48 04/13/2017 17:03 04/13/2017 21:54 04/14/2017 07:41  Glucose-Capillary Latest Ref Range: 65 - 99 mg/dL 182 (H) 252 (H) 140 (H) 222 (H) 191 (H)    Diabetes history: No- A1C 12.3% Outpatient Diabetes medications: NA  Current orders for Inpatient glycemic control: Novolog 0-15 units TID with meals, Novolog 0-5 units QHS, Novolog 3 units TID with meals, Metformin 500 mg BID, Amaryl 1mg  qday  Inpatient Diabetes Program Recommendations: Patient has had poor dietary intake over the last 24 hours 25% yesterday at breakfast and only 20% at supper.  If patient appetite remains poor, Amaryl may not be a safe medication because of the risk of hypoglycemia.   Please consider increasing Metformin to 1000 mg BID.    Patient will need to follow up with PCP regarding DM control.  Gentry Fitz, RN, BA, MHA, CDE Diabetes Coordinator Inpatient Diabetes Program  [phone] (Team Pager) [phone] ([place]) 04/14/2017 8:49 AM

## 2017-04-14 NOTE — Progress Notes (Signed)
[place] at  NAME: Kristi [name]    MR#:  [id number]  DATE OF BIRTH:  25-Apr-1940  SUBJECTIVE:  CHIEF COMPLAINT:   Chief Complaint  Patient presents with  . Loss of Consciousness  . Weakness  Patient with poor mobility, physical therapy recommending sniff placement-patient refuses to go, per gastroenterology will need clearance from cardiology prior to EGD which cannot be done for 5 days given Plavix use-placed on hold, in discussion with the patient-best to have a EGD done here given lack of resources at home/poor mobility, stress test was normal study with normal ejection fraction  REVIEW OF SYSTEMS:  CONSTITUTIONAL: No fever, fatigue or weakness.  EYES: No blurred or double vision.  EARS, NOSE, AND THROAT: No tinnitus or ear pain.  RESPIRATORY: No cough, shortness of breath, wheezing or hemoptysis.  CARDIOVASCULAR: No chest pain, orthopnea, edema.  GASTROINTESTINAL: No nausea, vomiting, diarrhea or abdominal pain.  GENITOURINARY: No dysuria, hematuria.  ENDOCRINE: No polyuria, nocturia,  HEMATOLOGY: No anemia, easy bruising or bleeding SKIN: No rash or lesion. MUSCULOSKELETAL: No joint pain or arthritis.   NEUROLOGIC: No tingling, numbness, weakness.  PSYCHIATRY: No anxiety or depression.   ROS  DRUG ALLERGIES:   Allergies  Allergen Reactions  . Penicillins Anaphylaxis  . Penicillin G Swelling and Rash    Has patient had a PCN reaction causing immediate rash, facial/tongue/throat swelling, SOB or lightheadedness with hypotension: Yes Has patient had a PCN reaction causing severe rash involving mucus membranes or skin necrosis: No Has patient had a PCN reaction that required hospitalization: No Has patient had a PCN reaction occurring within the last 10 years: No If all of the above answers are "NO", then may proceed with Cephalosporin use.     VITALS:  Blood pressure 114/72, pulse 63, temperature 98.1 F (36.7 C), resp.  rate 12, height 5\' 7"  (1.702 m), weight 82.1 kg (181 lb), SpO2 99 %.  PHYSICAL EXAMINATION:  GENERAL:  77 y.o.-year-old patient lying in the bed with no acute distress.  EYES: Pupils equal, round, reactive to light and accommodation. No scleral icterus. Extraocular muscles intact.  HEENT: Head atraumatic, normocephalic. Oropharynx and nasopharynx clear.  NECK:  Supple, no jugular venous distention. No thyroid enlargement, no tenderness.  LUNGS: Normal breath sounds bilaterally, no wheezing, rales,rhonchi or crepitation. No use of accessory muscles of respiration.  CARDIOVASCULAR: S1, S2 normal. No murmurs, rubs, or gallops.  ABDOMEN: Soft, nontender, nondistended. Bowel sounds present. No organomegaly or mass.  EXTREMITIES: No pedal edema, cyanosis, or clubbing.  NEUROLOGIC: Cranial nerves II through XII are intact. Muscle strength 5/5 in all extremities. Sensation intact. Gait not checked.  PSYCHIATRIC: The patient is alert and oriented x 3.  SKIN: No obvious rash, lesion, or ulcer.   Physical Exam LABORATORY PANEL:   CBC Recent Labs  Lab 04/11/17 0904  WBC 6.7  HGB 11.0*  HCT 33.8*  PLT 95*   ------------------------------------------------------------------------------------------------------------------  Chemistries  Recent Labs  Lab 04/10/17 2305  04/12/17 0342  NA 129*   < > 137  K 3.8   < > 4.1  CL 87*   < > 107  CO2 30   < > 23  GLUCOSE 708*   < > 221*  BUN 19   < > 9  CREATININE 0.99   < > 0.75  CALCIUM 8.9   < > 7.9*  AST 49*  --   --   ALT 6*  --   --  ALKPHOS 151*  --   --   BILITOT 0.7  --   --    < > = values in this interval not displayed.   ------------------------------------------------------------------------------------------------------------------  Cardiac Enzymes Recent Labs  Lab 04/11/17 1230 04/11/17 1811  TROPONINI 0.05* 0.05*    ------------------------------------------------------------------------------------------------------------------  RADIOLOGY:  Dg Esophagus  Result Date: 04/13/2017 CLINICAL DATA:  The patient reports increasing shortness of breath at rest and with exertion over the past 2 months with peripheral edema. The patient reports dysphagia for 1 month. History of esophageal dilation 10 years ago. EXAM: ESOPHOGRAM / BARIUM SWALLOW / BARIUM TABLET STUDY TECHNIQUE: Combined double contrast and single contrast examination performed using thick and thin barium liquid. The patient was observed with fluoroscopy swallowing a 13 mm barium sulphate tablet. Effervescent crystals were not administered due to the fact the study had to be performed with the patient semi supine and supine. FLUOROSCOPY TIME:  Fluoroscopy Time:  1 minute, 36 seconds Radiation Exposure Index (if provided by the fluoroscopic device): 916 micro Gy per meters square Number of Acquired Spot Images: 0 COMPARISON:  Abdominal and pelvic CT scan of 11 April 2017 FINDINGS: The patient ingested thick and thin barium without difficulty. The cervical esophagus was normal in a survey fashion. No laryngeal penetration of the barium was observed. The thoracic esophagus distended well down to its distal third. There there was a small non reducible hiatal hernia. The distal esophagus relaxed reasonably well and allowed passage of liquid barium into the hiatal hernia. However, the channel through the esophageal hiatus was narrowed and the caliber of the channel of communication between the hiatal hernia and the intra-abdominal portion of the stomach was persistently narrowed. The patient ingested the barium tablet which hung in the distal esophagus just proximal to the hiatal hernia and had to be allowed to dissolve. No evidence of ulceration or mass was observed. IMPRESSION: Moderate-sized non reducible hiatal hernia. The portion of the hiatal hernia-stomach  within the esophageal hiatus is narrowed. No definite volvulus is observed. Mild narrowing of the distal esophagus just proximal to the hiatal hernia. The barium tablet hung in this region and was allowed to dissolve. Direct visualization is recommended. Repeat dilation may be indicated. Electronically Signed   By: David  Martinique M.D.   On: 04/13/2017 07:58    ASSESSMENT AND PLAN:  1  acute abnormal esophagram/swallow study Noted for distal esophageal narrowing with hiatal hernia, recommending direct visualization Gastroenterology consulted for EGD-clear for procedure by cardiology as stress test was a normal study with ejection fraction 52%, EGD to be done in 5 days-Plavix held April 14, 2017 as biopsies are planned for, patient to remain in the hospital given lack of resources/poor mobility/refuses skilled nursing facility placement in the interim.    2  acute syncope Resolved Secondary to hypotension Physical therapy input appreciated-recommending skilled nursing facility placement though patient is not interested in placement Echo shown EF > 60%, carotid Dopplers from August 2018 was a negative study, orthostatics negative, stress test was normal study with ejection fraction 52%  3 new onset diabetes mellitus type 2 with severe hyperglycemia blood sugar of 700 Stable on current regiment Continue metformin, SSI, hemoglobin A1c 12.1 consistent with poor control, diabetic educator consulted while in house  4 acute hyponatremia, hypokalemia Secondary to dehydration/poor p.o. intake Resolved Coreg initially held-restarted on April 13, 2017 at reduced rate, Lasix discontinued given poor p.o. Intake  5 acute elevated troponins Most likely demand ischemia Echocardiogram showed EF more than 60%, stress  testing was normal/ejection fraction 50% No symptomatology of acute coronary syndrome conservative management/observation  6 hx of CAD, CABG, PPM Continue Coreg, DAPT w/ aspirin,  Plavix, and statin therapy  7 chronic Parkinson disease Stable on carbidopa/levodopa . 8 acute E. coli UTI Rocephin IV for 3-day course, sensitivities noted  All the records are reviewed and case discussed with Care Management/Social Workerr. Management plans discussed with the patient, family and they are in agreement.  CODE STATUS: full  TOTAL TIME TAKING CARE OF THIS PATIENT: 35 minutes.     POSSIBLE D/C IN 5 DAYS after EGD, DEPENDING ON CLINICAL CONDITION.   Avel Peace Salary M.D on 04/14/2017   Between 7am to 6pm - Pager - [phone]  After 6pm go to [email or web address] - password [email or web address] [facility]  Office  [phone]  CC: Primary care physician; Lorelee Market, MD  Note: This dictation was prepared with Dragon dictation along with smaller phrase technology. Any transcriptional errors that result from this process are unintentional.
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
## 2017-04-14 NOTE — Clinical Social Work Note (Signed)
CSW received referral for SNF, patient is declining SNF.  Case discussed with case manager and plan is to discharge home with home health.  CSW to sign off please re-consult if social work needs arise.  Jones Broom. [place], MSW, [place]

## 2017-04-14 NOTE — Progress Notes (Addendum)
Physical Therapy Treatment Patient Details Name: Kristi [name] MRN: [id number] DOB: 25-Jul-1940 Today's Date: 04/14/2017    History of Present Illness Pt admitted s/p syncope and fall and with dx of new onset of DM type II.  PMH includes spinal stenosis, PD, neck pain, Htn, HLD, broken arm, depression, CAD and anxiety.    PT Comments    Pt had just returned to bed after walking to bathroom with nursing staff.  Supine to sit without assist.  She stood with min assist and RW.  Initially she stated she wanted to walk but once she took a few steps she stated she was too tired but agreed to walk around bed to recliner.  Participated in exercises as described below.  Encouraged pt to stay up in recliner for lunch and she hesitantly agreed.  Educated on importance of out of bed and mobility/ex to maintain strength during hospital stay.  She voiced understanding.  Pt remains appropriate for SNF but is resistant at this time "I'm going home"  Stated she has an aide 4 hours a day and can get therapies at home.  Declines further therapy at this time due to fatigue.  Will continue as appropriate.   Follow Up Recommendations  SNF     Equipment Recommendations       Recommendations for Other Services       Precautions / Restrictions Precautions Precautions: Fall Restrictions Weight Bearing Restrictions: No    Mobility  Bed Mobility                  Transfers Overall transfer level: Needs assistance Equipment used: Rolling walker (2 wheeled) Transfers: Sit to/from Stand Sit to Stand: Min assist         General transfer comment: only slight assist today to stand.  Ambulation/Gait Ambulation/Gait assistance: Min guard Ambulation Distance (Feet): 20 Feet Assistive device: Rolling walker (2 wheeled) Gait Pattern/deviations: Step-through pattern;Decreased step length - right;Decreased step length - left   Gait velocity interpretation: Below normal speed for  age/gender General Gait Details: ambulated around bed   Stairs            Wheelchair Mobility    Modified Rankin (Stroke Patients Only)       Balance Overall balance assessment: Modified Independent                                          Cognition Arousal/Alertness: Awake/alert Behavior During Therapy: WFL for tasks assessed/performed Overall Cognitive Status: Within Functional Limits for tasks assessed                                        Exercises Other Exercises Other Exercises: standing exercises - marching in place x 10, seated LAQ and marches x 10 BLE    General Comments        Pertinent Vitals/Pain Pain Assessment: No/denies pain    Home Living                      Prior Function            PT Goals (current goals can now be found in the care plan section) Progress towards PT goals: Progressing toward goals    Frequency    Min 2X/week  PT Plan Current plan remains appropriate    Co-evaluation              AM-PAC PT "6 Clicks" Daily Activity  Outcome Measure  Difficulty turning over in bed (including adjusting bedclothes, sheets and blankets)?: None Difficulty moving from lying on back to sitting on the side of the bed? : None Difficulty sitting down on and standing up from a chair with arms (e.g., wheelchair, bedside commode, etc,.)?: Unable Help needed moving to and from a bed to chair (including a wheelchair)?: A Little Help needed walking in hospital room?: A Little Help needed climbing 3-5 steps with a railing? : A Little 6 Click Score: 18    End of Session Equipment Utilized During Treatment: Gait belt Activity Tolerance: Patient limited by fatigue Patient left: in chair;with chair alarm set;with call bell/phone within reach         Time: 1013-1023 PT Time Calculation (min) (ACUTE ONLY): 10 min  Charges:  $Gait Training: 8-22 mins                    G Codes:        Chesley Noon, PTA 04/14/17, 11:08 AM

## 2017-04-14 NOTE — Care Management Note (Signed)
Case Management Note  Patient Details  Name: Kristi [name] MRN: [id number] Date of Birth: 25-Apr-1940  Subjective/Objective:                   RNCM met with patient during leader rounds. She declines going to SNF however she would like to have [facility]. She states she has a 4-wheeled walker but doesn't typically use it.  She has hired caregivers 4 hours per day 5 days per week and two adult children along with some grandchildren that check on her.  Action/Plan:    Referral to [facility]; they have accepted.  Expected Discharge Date:                  Expected Discharge Plan:     In-House Referral:     Discharge planning Services     Post Acute Care Choice:  Home Health Choice offered to:  Patient  DME Arranged:    DME Agency:     HH Arranged:    [place]:  [facility]  Status of Service:  In process, will continue to follow  If discussed at Long Length of Stay Meetings, dates discussed:    Additional Comments:  Marshell Garfinkel, RN 04/14/2017, 4:49 PM

## 2017-04-14 NOTE — Progress Notes (Signed)
10/07/16 stress myoview was normal LVEF 52 %

## 2017-04-14 NOTE — Care Management Important Message (Signed)
Important Message  Patient Details  Name: Kristi [name] MRN: [id number] Date of Birth: [birth year]   Medicare Important Message Given:  Yes    Beverly Sessions, RN 04/14/2017, 4:51 PM

## 2017-04-14 NOTE — Care Management Note (Signed)
Case Management Note  Patient Details  Name: Kristi [name] MRN: [id number] Date of Birth: Dec 04, 1940  Subjective/Objective:                   Met with patient while doing leader rounding. She is from home alone. She was given [facility] [place] 3 months ago and patient states she has 4 hours 5 days per week with personal care services. She has 2 children that she states care for her along with 3 grandchildren that also help her.  She has used [facility] and would like to use them again.  She is declining SNF. She has a 4 wheeled walker at home but states she doesn't use it.  Her PCP is with [facility].  Action/Plan:   Referral to [facility]. Stephanie RNCM updated.  Expected Discharge Date:  12/26/16               Expected Discharge Plan:  [place]  In-House Referral:     Discharge planning Services  NA, CM Consult  Post Acute Care Choice:  Durable Medical Equipment Choice offered to:  Adult Children, Patient  DME Arranged:    DME Agency:     HH Arranged:  RN, PT, Nurse's Aide, Social Work(currently open to [place] for HH=PT, OT, SW) [place] Agency:  [facility]  Status of Service:  In process, will continue to follow  If discussed at Long Length of Stay Meetings, dates discussed:    Additional Comments:  Marshell Garfinkel, RN 04/14/2017, 10:57 AM

## 2017-04-14 NOTE — Progress Notes (Signed)
Per Dr. Jerelyn Charles ok to give her metformin with a blood sugar of 86. Metformin given, patient ate 75% of her meal. Will continue to monitor patient.

## 2017-04-15 LAB — GLUCOSE, CAPILLARY
Glucose-Capillary: 189 mg/dL — ABNORMAL HIGH (ref 65–99)
Glucose-Capillary: 150 mg/dL — ABNORMAL HIGH (ref 65–99)
Glucose-Capillary: 250 mg/dL — ABNORMAL HIGH (ref 65–99)
Glucose-Capillary: 102 mg/dL — ABNORMAL HIGH (ref 65–99)
Glucose-Capillary: 178 mg/dL — ABNORMAL HIGH (ref 65–99)

## 2017-04-15 NOTE — Progress Notes (Signed)
SUBJECTIVE: Patient denies any chest pain or shortness of breath and feels much better today.   Vitals:   04/14/17 1736 04/14/17 2016 04/15/17 0418 04/15/17 0745  BP: (!) 113/59 116/61 117/68 97/69  Pulse: 70 68 61 63  Resp: 18 18 17 14   Temp:  98 F (36.7 C) 98.4 F (36.9 C) 98.5 F (36.9 C)  TempSrc:  Oral Oral Oral  SpO2: 100% 100% 100% 99%  Weight:   184 lb (83.5 kg)   Height:        Intake/Output Summary (Last 24 hours) at 04/15/2017 1006 Last data filed at 04/15/2017 0931 Gross per 24 hour  Intake 1200 ml  Output 1150 ml  Net 50 ml    LABS: Basic Metabolic Panel: No results for input(s): NA, K, CL, CO2, GLUCOSE, BUN, CREATININE, CALCIUM, MG, PHOS in the last 72 hours. Liver Function Tests: No results for input(s): AST, ALT, ALKPHOS, BILITOT, PROT, ALBUMIN in the last 72 hours. No results for input(s): LIPASE, AMYLASE in the last 72 hours. CBC: No results for input(s): WBC, NEUTROABS, HGB, HCT, MCV, PLT in the last 72 hours. Cardiac Enzymes: No results for input(s): CKTOTAL, CKMB, CKMBINDEX, TROPONINI in the last 72 hours. BNP: Invalid input(s): POCBNP D-Dimer: No results for input(s): DDIMER in the last 72 hours. Hemoglobin A1C: No results for input(s): HGBA1C in the last 72 hours. Fasting Lipid Panel: No results for input(s): CHOL, HDL, LDLCALC, TRIG, CHOLHDL, LDLDIRECT in the last 72 hours. Thyroid Function Tests: No results for input(s): TSH, T4TOTAL, T3FREE, THYROIDAB in the last 72 hours.  Invalid input(s): FREET3 Anemia Panel: No results for input(s): VITAMINB12, FOLATE, FERRITIN, TIBC, IRON, RETICCTPCT in the last 72 hours.   PHYSICAL EXAM General: Well developed, well nourished, in no acute distress HEENT:  Normocephalic and atramatic Neck:  No JVD.  Lungs: Clear bilaterally to auscultation and percussion. Heart: HRRR . Normal S1 and S2 without gallops or murmurs.  Abdomen: Bowel sounds are positive, abdomen soft and non-tender  Msk:  Back  normal, normal gait. Normal strength and tone for age. Extremities: No clubbing, cyanosis or edema.   Neuro: Alert and oriented X 3. Psych:  Good affect, responds appropriately  TELEMETRY: Sinus rhythm  ASSESSMENT AND PLAN: Syncopal episode etiology unclear patient had a nuclear stress test done in July and was normal with left ventricular ejection fraction 52%. Patient has history of CABG and has been quite stable coronary artery disease-wise.  Active Problems:   Syncope and collapse    Gino Garrabrant A, MD, [facility] 04/15/2017 10:06 AM

## 2017-04-15 NOTE — Progress Notes (Signed)
Dr. Tressia Miners aware of soft BP this morning. Instructed to hold coreg.

## 2017-04-15 NOTE — Progress Notes (Signed)
[place] at [place] NAME: Kristi [name]    MR#:  [id number]  DATE OF BIRTH:  [birth year]  SUBJECTIVE:  CHIEF COMPLAINT:   Chief Complaint  Patient presents with  . Loss of Consciousness  . Weakness   - Complains of weakness. Also loss of appetite and difficulty swallowing. -Scheduled for EGD early next week  REVIEW OF SYSTEMS:  Review of Systems  Constitutional: Positive for malaise/fatigue. Negative for chills and fever.  HENT: Negative for congestion, ear discharge, hearing loss and nosebleeds.   Eyes: Negative for blurred vision and double vision.  Respiratory: Negative for cough, shortness of breath and wheezing.   Cardiovascular: Negative for chest pain, palpitations and leg swelling.  Gastrointestinal: Negative for abdominal pain, constipation, diarrhea, nausea and vomiting.       Dysphagia  Genitourinary: Negative for dysuria and urgency.  Musculoskeletal: Positive for falls.  Neurological: Positive for weakness. Negative for dizziness, sensory change, speech change, focal weakness, seizures and headaches.  Psychiatric/Behavioral: Negative for depression.    DRUG ALLERGIES:   Allergies  Allergen Reactions  . Penicillins Anaphylaxis  . Penicillin G Swelling and Rash    Has patient had a PCN reaction causing immediate rash, facial/tongue/throat swelling, SOB or lightheadedness with hypotension: Yes Has patient had a PCN reaction causing severe rash involving mucus membranes or skin necrosis: No Has patient had a PCN reaction that required hospitalization: No Has patient had a PCN reaction occurring within the last 10 years: No If all of the above answers are "NO", then may proceed with Cephalosporin use.     VITALS:  Blood pressure 97/69, pulse 63, temperature 98.5 F (36.9 C), temperature source Oral, resp. rate 14, height 5\' 7"  (1.702 m), weight 83.5 kg (184 lb), SpO2 99 %.  PHYSICAL EXAMINATION:  Physical  Exam  GENERAL:  77 y.o.-year-old elderly patient lying in the bed with no acute distress.  EYES: Pupils equal, round, reactive to light and accommodation. No scleral icterus. Extraocular muscles intact.  HEENT: Head atraumatic, normocephalic. Oropharynx and nasopharynx clear.  NECK:  Supple, no jugular venous distention. No thyroid enlargement, no tenderness.  LUNGS: Normal breath sounds bilaterally, no wheezing, rales,rhonchi or crepitation. No use of accessory muscles of respiration.  CARDIOVASCULAR: S1, S2 normal. No  rubs, or gallops. 2/6 systolic murmur present ABDOMEN: Soft, nontender, nondistended. Bowel sounds present. No organomegaly or mass.  EXTREMITIES: No pedal edema, cyanosis, or clubbing.  NEUROLOGIC: Cranial nerves II through XII are intact. Muscle strength 5/5 in all extremities. Sensation intact. Gait not checked.  PSYCHIATRIC: The patient is alert and oriented x 3.  SKIN: No obvious rash, lesion, or ulcer.    LABORATORY PANEL:   CBC Recent Labs  Lab 04/11/17 0904  WBC 6.7  HGB 11.0*  HCT 33.8*  PLT 95*   ------------------------------------------------------------------------------------------------------------------  Chemistries  Recent Labs  Lab 04/10/17 2305  04/12/17 0342  NA 129*   < > 137  K 3.8   < > 4.1  CL 87*   < > 107  CO2 30   < > 23  GLUCOSE 708*   < > 221*  BUN 19   < > 9  CREATININE 0.99   < > 0.75  CALCIUM 8.9   < > 7.9*  AST 49*  --   --   ALT 6*  --   --   ALKPHOS 151*  --   --   BILITOT 0.7  --   --    < > =  values in this interval not displayed.   ------------------------------------------------------------------------------------------------------------------  Cardiac Enzymes Recent Labs  Lab 04/11/17 1811  TROPONINI 0.05*   ------------------------------------------------------------------------------------------------------------------  RADIOLOGY:  No results found.  EKG:   Orders placed or performed during the  hospital encounter of 04/10/17  . ED EKG  . ED EKG  . EKG 12-Lead  . EKG 12-Lead    ASSESSMENT AND PLAN:   77 year old female with past medical history significant for coronary artery disease, hypertension, Parkinson's disease, spinal stenosis, anxiety presents to the hospital secondary to a fall  1. Fall-questionable syncope. Noncardiac. -From weakness and possibly vasovagal. Concern for an acute cystitis -Improved with IV fluids. Being treated for infection Physical therapy recommended rehabilitation, patient interested in going back home with home health.  2. 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 -GI consult is appreciated. -For EGD early next week. Last use of Plavix was 2 days ago  3. CAD status post bypass-stable. Appreciate cardiology consult. Last stress test in July 2018 was normal. EF of 52%. Cardiology has cleared her for the procedure. -Continue cardiac medications  4. Diabetes mellitus-on Amaryl, metformin and sliding scale insulin   5. Depression and anxiety-continue home medications were murmur.  6. DVT prophylaxis-continue Lovenox   Physical therapy consulted   All the records are reviewed and case discussed with Care Management/Social Workerr. Management plans discussed with the patient, family and they are in agreement.  CODE STATUS: Full code  TOTAL TIME TAKING CARE OF THIS PATIENT: 38 minutes.   POSSIBLE D/C IN 2-3 DAYS, DEPENDING ON CLINICAL CONDITION.   Gladstone Lighter M.D on 04/15/2017 at 10:58 AM  Between 7am to 6pm - Pager - [phone]  After 6pm go to [email or web address] - password [email or web address] [place] [facility]  Office  [phone]  CC: Primary care physician; Lorelee Market, MD

## 2017-04-16 LAB — GLUCOSE, CAPILLARY
Glucose-Capillary: 134 mg/dL — ABNORMAL HIGH (ref 65–99)
Glucose-Capillary: 115 mg/dL — ABNORMAL HIGH (ref 65–99)
Glucose-Capillary: 181 mg/dL — ABNORMAL HIGH (ref 65–99)
Glucose-Capillary: 212 mg/dL — ABNORMAL HIGH (ref 65–99)
Glucose-Capillary: 194 mg/dL — ABNORMAL HIGH (ref 65–99)

## 2017-04-16 LAB — BASIC METABOLIC PANEL
Anion gap: 7 (ref 5–15)
BUN: 7 mg/dL (ref 6–20)
CO2: 26 mmol/L (ref 22–32)
Calcium: 8.4 mg/dL — ABNORMAL LOW (ref 8.9–10.3)
Chloride: 106 mmol/L (ref 101–111)
Creatinine, Ser: 0.8 mg/dL (ref 0.44–1.00)
GFR calc Af Amer: >60 mL/min (ref >60)
GFR calc non Af Amer: >60 mL/min (ref >60)
Glucose, Bld: 123 mg/dL — ABNORMAL HIGH (ref 65–99)
Potassium: 3.4 mmol/L — ABNORMAL LOW (ref 3.5–5.1)
Sodium: 139 mmol/L (ref 135–145)

## 2017-04-16 LAB — CBC
HCT: 32.6 % — ABNORMAL LOW (ref 35.0–47.0)
Hemoglobin: 10.9 g/dL — ABNORMAL LOW (ref 12.0–16.0)
MCH: 31.9 pg (ref 26.0–34.0)
MCHC: 33.6 g/dL (ref 32.0–36.0)
MCV: 95 fL (ref 80.0–100.0)
Platelets: 139 10*3/uL — ABNORMAL LOW (ref 150–440)
RBC: 3.43 MIL/uL — ABNORMAL LOW (ref 3.80–5.20)
RDW: 15.1 % — ABNORMAL HIGH (ref 11.5–14.5)
WBC: 5.6 10*3/uL (ref 3.6–11.0)

## 2017-04-16 MED ORDER — ENSURE ENLIVE PO LIQD
237.0000 mL | Freq: Two times a day (BID) | ORAL | Status: DC
  Administered 2017-04-16 – 2017-04-18 (×5): 237 mL via ORAL

## 2017-04-16 MED ORDER — POTASSIUM CHLORIDE 20 MEQ PO PACK
40.0000 meq | PACK | Freq: Once | ORAL | Status: AC
  Administered 2017-04-16: 40 meq via ORAL
  Filled 2017-04-16: qty 2

## 2017-04-16 NOTE — Progress Notes (Signed)
[place] at [place] NAME: Kristi [name]    MR#:  [id number]  DATE OF BIRTH:  07-17-1940  SUBJECTIVE:  CHIEF COMPLAINT:   Chief Complaint  Patient presents with  . Loss of Consciousness  . Weakness   - Doing well, still complains of difficulty swallowing.. -Scheduled for EGD early next week  REVIEW OF SYSTEMS:  Review of Systems  Constitutional: Positive for malaise/fatigue. Negative for chills and fever.  HENT: Negative for congestion, ear discharge, hearing loss and nosebleeds.   Eyes: Negative for blurred vision and double vision.  Respiratory: Negative for cough, shortness of breath and wheezing.   Cardiovascular: Negative for chest pain, palpitations and leg swelling.  Gastrointestinal: Negative for abdominal pain, constipation, diarrhea, nausea and vomiting.       Dysphagia  Genitourinary: Negative for dysuria and urgency.  Musculoskeletal: Positive for falls.  Neurological: Positive for weakness. Negative for dizziness, sensory change, speech change, focal weakness, seizures and headaches.  Psychiatric/Behavioral: Negative for depression.    DRUG ALLERGIES:   Allergies  Allergen Reactions  . Penicillins Anaphylaxis  . Penicillin G Swelling and Rash    Has patient had a PCN reaction causing immediate rash, facial/tongue/throat swelling, SOB or lightheadedness with hypotension: Yes Has patient had a PCN reaction causing severe rash involving mucus membranes or skin necrosis: No Has patient had a PCN reaction that required hospitalization: No Has patient had a PCN reaction occurring within the last 10 years: No If all of the above answers are "NO", then may proceed with Cephalosporin use.     VITALS:  Blood pressure 110/67, pulse 65, temperature 98.1 F (36.7 C), temperature source Oral, resp. rate 18, height 5\' 7"  (1.702 m), weight 83.5 kg (184 lb), SpO2 98 %.  PHYSICAL EXAMINATION:  Physical Exam  GENERAL:  77  y.o.-year-old elderly patient lying in the bed with no acute distress.  EYES: Pupils equal, round, reactive to light and accommodation. No scleral icterus. Extraocular muscles intact.  HEENT: Head atraumatic, normocephalic. Oropharynx and nasopharynx clear.  NECK:  Supple, no jugular venous distention. No thyroid enlargement, no tenderness.  LUNGS: Normal breath sounds bilaterally, no wheezing, rales,rhonchi or crepitation. No use of accessory muscles of respiration.  CARDIOVASCULAR: S1, S2 normal. No  rubs, or gallops. 2/6 systolic murmur present ABDOMEN: Soft, nontender, nondistended. Bowel sounds present. No organomegaly or mass.  EXTREMITIES: No pedal edema, cyanosis, or clubbing.  NEUROLOGIC: Cranial nerves II through XII are intact. Muscle strength 5/5 in all extremities. Sensation intact. Gait not checked.  PSYCHIATRIC: The patient is alert and oriented x 3.  SKIN: No obvious rash, lesion, or ulcer.    LABORATORY PANEL:   CBC Recent Labs  Lab 04/16/17 0638  WBC 5.6  HGB 10.9*  HCT 32.6*  PLT 139*   ------------------------------------------------------------------------------------------------------------------  Chemistries  Recent Labs  Lab 04/10/17 2305  04/16/17 0638  NA 129*   < > 139  K 3.8   < > 3.4*  CL 87*   < > 106  CO2 30   < > 26  GLUCOSE 708*   < > 123*  BUN 19   < > 7  CREATININE 0.99   < > 0.80  CALCIUM 8.9   < > 8.4*  AST 49*  --   --   ALT 6*  --   --   ALKPHOS 151*  --   --   BILITOT 0.7  --   --    < > =  values in this interval not displayed.   ------------------------------------------------------------------------------------------------------------------  Cardiac Enzymes Recent Labs  Lab 04/11/17 1811  TROPONINI 0.05*   ------------------------------------------------------------------------------------------------------------------  RADIOLOGY:  No results found.  EKG:   Orders placed or performed during the hospital encounter of  04/10/17  . ED EKG  . ED EKG  . EKG 12-Lead  . EKG 12-Lead    ASSESSMENT AND PLAN:   77 year old female with past medical history significant for coronary artery disease, hypertension, Parkinson's disease, spinal stenosis, anxiety presents to the hospital secondary to a fall  1. Fall-questionable syncope. Noncardiac. -From weakness and possibly vasovagal. Concern for an acute cystitis -Improved with IV fluids. Being treated for infection Physical therapy recommended rehabilitation, patient interested in going back home with home health.  2. 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 -GI consult is appreciated. -For EGD early next week. Last use of Plavix was 3 days ago  3. CAD status post bypass-stable. Appreciate cardiology consult. Last stress test in July 2018 was normal. EF of 52%. Cardiology has cleared her for the procedure. -Continue cardiac medications  4. Diabetes mellitus-on Amaryl, metformin and sliding scale insulin   5. Depression and anxiety-continue home medications.  6. DVT prophylaxis-continue Lovenox  7. Hypokalemia-being replaced   Physical therapy consulted. Needs home health.    All the records are reviewed and case discussed with Care Management/Social Workerr. Management plans discussed with the patient, family and they are in agreement.  CODE STATUS: Full code  TOTAL TIME TAKING CARE OF THIS PATIENT: 28 minutes.   POSSIBLE D/C IN 2 DAYS, DEPENDING ON CLINICAL CONDITION.   Gladstone Lighter M.D on 04/16/2017 at 11:22 AM  Between 7am to 6pm - Pager - [phone]  After 6pm go to [email or web address] - password [email or web address] [place] [facility]  Office  [phone]  CC: Primary care physician; Lorelee Market, MD

## 2017-04-17 DIAGNOSIS — R1310 Dysphagia, unspecified: Secondary | ICD-10-CM

## 2017-04-17 LAB — GLUCOSE, CAPILLARY
Glucose-Capillary: 147 mg/dL — ABNORMAL HIGH (ref 65–99)
Glucose-Capillary: 134 mg/dL — ABNORMAL HIGH (ref 65–99)
Glucose-Capillary: 290 mg/dL — ABNORMAL HIGH (ref 65–99)
Glucose-Capillary: 189 mg/dL — ABNORMAL HIGH (ref 65–99)
Glucose-Capillary: 117 mg/dL — ABNORMAL HIGH (ref 65–99)

## 2017-04-17 NOTE — NC FL2 (Signed)
[place] LEVEL OF CARE SCREENING TOOL     IDENTIFICATION  Patient Name: Kristi [name] Birthdate: 23-Dec-1940 Sex: female Admission Date (Current Location): 04/10/2017  [facility] and [place] Number:  Selena Lesser ([id number]) Facility and Address:  [facility], [address] [place], [place] [address]      Provider Number: [id number]  Attending Physician Name and Address:  Gladstone Lighter, MD  Relative Name and Phone Number:       Current Level of Care: Hospital Recommended Level of Care: [place] Prior Approval Number:    Date Approved/Denied:   PASRR Number: [id number])  Discharge Plan: SNF    Current Diagnoses: Patient Active Problem List   Diagnosis Date Noted  . Syncope and collapse 04/11/2017  . Lumbar facet osteoarthritis (Bilateral) 01/17/2017  . Acute postoperative pain 01/17/2017  . Hypokalemia 12/24/2016  . Anxiety 12/07/2016  . 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 Cerebrovascular accident ([place]) 12/07/2016  . Chronic depression 12/07/2016  . Essential hypertension 12/07/2016  . Hypothyroidism 12/07/2016  . Mixed hyperlipidemia 12/07/2016  . Myocardial infarction ([place]) 12/07/2016  . TIA (transient ischemic attack) 11/17/2016  . Parkinson disease ([place]) 10/27/2016  . Tremor 10/27/2016  . Chronic knee pain (B) (R>L) 10/19/2016  . Chest pain 09/29/2016  . Abnormal CT scan, lumbar spine 09/08/2016  . Lumbar foraminal stenosis (multilevel) 09/08/2016  . Fracture of clavicle 08/03/2016  . Polyneuropathy 05/17/2016  . Neurogenic pain 04/12/2016  . Chronic lower extremity pain (Bilateral) (R>L) 04/12/2016  . Chronic lower extremity radicular pain (Primary Source of Pain) (Bilateral) (R>L) 04/12/2016  . Lower extremity weakness 04/12/2016  . Chronic low back pain (Secondary source of pain) (Bilateral) (R>L) 04/12/2016  . Chronic wrist pain (Left) (secondary to fracture) 04/12/2016  . Lumbar  spondylosis 04/12/2016  . Chronic pain syndrome 04/11/2016  . Long term current use of opiate analgesic 04/11/2016  . Long term prescription opiate use 04/11/2016  . Opiate use 04/11/2016  . Long term prescription benzodiazepine use 04/11/2016  . Lumbar radiculopathy (Multilevel) (Bilateral) 05/07/2015  . Lumbar facet syndrome (Bilateral) (R>L) 08/21/2014  . 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 Sacroiliac joint dysfunction (Bilateral) 08/13/2014  . Frequent falls 06/04/2014  . Memory loss 06/04/2014  . Anxiety and depression 08/22/2013  . CAD (coronary artery disease) 08/22/2013  . Therapeutic opioid-induced constipation (OIC) 08/22/2013  . GERD (gastroesophageal reflux disease) 08/22/2013    Orientation RESPIRATION BLADDER Height & Weight     Self, Time, Situation, Place  Normal Continent Weight: 184 lb (83.5 kg) Height:  5\' 7"  (170.2 cm)  BEHAVIORAL SYMPTOMS/MOOD NEUROLOGICAL BOWEL NUTRITION STATUS      Continent Diet(Diet: Heart Healhty/ Carb Modified. )  AMBULATORY STATUS COMMUNICATION OF NEEDS Skin   Extensive Assist Verbally Normal                       Personal Care Assistance Level of Assistance  Bathing, Feeding, Dressing Bathing Assistance: Limited assistance Feeding assistance: Independent Dressing Assistance: Limited assistance     Functional Limitations Info  Sight, Hearing, Speech Sight Info: Adequate Hearing Info: Adequate Speech Info: Adequate    SPECIAL CARE FACTORS FREQUENCY  PT (By licensed PT), OT (By licensed OT)     PT Frequency: (5) OT Frequency: (5)            Contractures      Additional Factors Info  Code Status, Allergies Code Status Info: (Full Code. ) Allergies Info: (Penicillins,  Penicillin G)           Current Medications (04/17/2017):  This is the current hospital active medication list Current Facility-Administered Medications  Medication Dose Route Frequency Provider Last Rate  Last Dose  . acetaminophen (TYLENOL) tablet 650 mg  650 mg Oral Q6H PRN Saundra Shelling, MD   650 mg at 04/16/17 2208   Or  . acetaminophen (TYLENOL) suppository 650 mg  650 mg Rectal Q6H PRN Saundra Shelling, MD      . carbidopa-levodopa (SINEMET IR) 25-100 MG per tablet immediate release 1 tablet  1 tablet Oral TID Saundra Shelling, MD   1 tablet at 04/17/17 0841  . carvedilol (COREG) tablet 3.125 mg  3.125 mg Oral BID WC Salary, Montell D, MD   3.125 mg at 04/17/17 0839  . cefTRIAXone (ROCEPHIN) 1 g in dextrose 5 % 50 mL IVPB  1 g Intravenous q1800 Epifanio Lesches, MD 100 mL/hr at 04/16/17 1814 1 g at 04/16/17 1814  . cholecalciferol (VITAMIN D) tablet 1,000 Units  1,000 Units Oral Daily Saundra Shelling, MD   1,000 Units at 04/17/17 0840  . docusate sodium (COLACE) capsule 100 mg  100 mg Oral BID Saundra Shelling, MD   100 mg at 04/15/17 0935  . enoxaparin (LOVENOX) injection 40 mg  40 mg Subcutaneous Q24H Saundra Shelling, MD   40 mg at 04/16/17 2209  . feeding supplement (ENSURE ENLIVE) (ENSURE ENLIVE) liquid 237 mL  237 mL Oral BID BM Gladstone Lighter, MD   237 mL at 04/17/17 0852  . fluticasone (FLONASE) 50 MCG/ACT nasal spray 2 spray  2 spray Each Nare Daily Saundra Shelling, MD   2 spray at 04/17/17 0852  . glimepiride (AMARYL) tablet 1 mg  1 mg Oral Q breakfast Salary, Montell D, MD   1 mg at 04/17/17 0841  . insulin aspart (novoLOG) injection 0-15 Units  0-15 Units Subcutaneous TID WC Saundra Shelling, MD   2 Units at 04/17/17 0900  . insulin aspart (novoLOG) injection 0-5 Units  0-5 Units Subcutaneous QHS Saundra Shelling, MD   2 Units at 04/14/17 2202  . insulin aspart (novoLOG) injection 3 Units  3 Units Subcutaneous TID WC Saundra Shelling, MD   3 Units at 04/16/17 1636  . levothyroxine (SYNTHROID, LEVOTHROID) tablet 75 mcg  75 mcg Oral QAC breakfast Epifanio Lesches, MD   75 mcg at 04/17/17 0838  . linaclotide Rolan Lipa) capsule 145 mcg  145 mcg Oral Daily Saundra Shelling, MD   145 mcg at  04/15/17 0936  . loratadine (CLARITIN) tablet 10 mg  10 mg Oral Daily Pyreddy, Reatha Harps, MD   10 mg at 04/17/17 0840  . megestrol (MEGACE) 400 MG/10ML suspension 400 mg  400 mg Oral Daily Salary, Montell D, MD   400 mg at 04/17/17 0845  . metFORMIN (GLUCOPHAGE) tablet 500 mg  500 mg Oral BID WC Epifanio Lesches, MD   500 mg at 04/17/17 0841  . mupirocin ointment (BACTROBAN) 2 % 1 application  1 application Nasal BID Saundra Shelling, MD   1 application at 24/40/10 0847  . nitroGLYCERIN (NITROSTAT) SL tablet 0.4 mg  0.4 mg Sublingual Q5 min PRN Pyreddy, Reatha Harps, MD      . olopatadine (PATANOL) 0.1 % ophthalmic solution 1 drop  1 drop Both Eyes BID Saundra Shelling, MD   1 drop at 04/17/17 0846  . ondansetron (ZOFRAN) tablet 4 mg  4 mg Oral Q6H PRN Saundra Shelling, MD       Or  . ondansetron (ZOFRAN) injection  4 mg  4 mg Intravenous Q6H PRN Pyreddy, Reatha Harps, MD      . oxyCODONE-acetaminophen (PERCOCET/ROXICET) 5-325 MG per tablet 1 tablet  1 tablet Oral Q6H PRN Saundra Shelling, MD   1 tablet at 04/16/17 0945  . pantoprazole (PROTONIX) EC tablet 40 mg  40 mg Oral Daily Pyreddy, Reatha Harps, MD   40 mg at 04/17/17 1062  . rosuvastatin (CRESTOR) tablet 20 mg  20 mg Oral Daily Pyreddy, Reatha Harps, MD   20 mg at 04/17/17 0840  . senna-docusate (Senokot-S) tablet 1 tablet  1 tablet Oral QHS PRN Saundra Shelling, MD      . sertraline (ZOLOFT) tablet 100 mg  100 mg Oral Daily Pyreddy, Reatha Harps, MD   100 mg at 04/17/17 0839  . sertraline (ZOLOFT) tablet 50 mg  50 mg Oral QHS Saundra Shelling, MD   50 mg at 04/16/17 2209  . sodium chloride flush (NS) 0.9 % injection 3 mL  3 mL Intravenous Q12H Saundra Shelling, MD   3 mL at 04/17/17 0903  . traZODone (DESYREL) tablet 50 mg  50 mg Oral QHS PRN Saundra Shelling, MD   50 mg at 04/11/17 2110  . vitamin B-12 (CYANOCOBALAMIN) tablet 500 mcg  500 mcg Oral Daily Saundra Shelling, MD   500 mcg at 04/17/17 6948     Discharge Medications: Please see discharge summary for a list of discharge  medications.  Relevant Imaging Results:  Relevant Lab Results:   Additional Information (SSN: [id number])  Karene Bracken, Veronia Beets, LCSW

## 2017-04-17 NOTE — Clinical Social Work Note (Signed)
Clinical Social Work Assessment  Patient Details  Name: Kristi [name] MRN: [id number] Date of Birth: 03/17/1941  Date of referral:  04/17/17               Reason for consult:  Facility Placement                Permission sought to share information with:    Permission granted to share information::     Name::        Agency::     Relationship::     Contact Information:     Housing/Transportation Living arrangements for the past 2 months:  Single Family Home Source of Information:  Patient Patient Interpreter Needed:  None Criminal Activity/Legal Involvement Pertinent to Current Situation/Hospitalization:  No - Comment as needed Significant Relationships:  Adult Children Lives with:  Self Do you feel safe going back to the place where you live?  Yes Need for family participation in patient care:  Yes (Comment)  Care giving concerns:  Patient lives alone in [place].    Social Worker assessment / plan:  Holiday representative (CSW) reviewed chart and noted that PT is recommending SNF. CSW met with patient alone at bedside to discuss D/C plan. Patient was alert and oriented X4 and was laying in the bed. CSW introduced self and explained role of CSW department. Patient reported that she lives alone in [place] and has home health PT. Per patient her daughter Kristi [name] is her HPOA. CSW explained SNF process. Patient adamantly refused SNF and stated that she will go home with home health. RN case manager aware of above. CSW will continue to follow and assist as needed.   Employment status:  Retired Forensic scientist:  Information systems manager, [employer] [place] PT Recommendations:  [facility] / Referral to community resources:  Other (Comment Required)(Patient is refusing SNF. )  Patient/Family's Response to care:  Patient refused SNF.   Patient/Family's Understanding of and Emotional Response to Diagnosis, Current Treatment, and Prognosis:  Patient was pleasant and  thanked CSW for visit.   Emotional Assessment Appearance:  Appears stated age Attitude/Demeanor/Rapport:    Affect (typically observed):  Accepting, Adaptable, Pleasant Orientation:  Oriented to Self, Oriented to Place, Oriented to  Time, Oriented to Situation Alcohol / Substance use:  Not Applicable Psych involvement (Current and /or in the community):  No (Comment)  Discharge Needs  Concerns to be addressed:  Discharge Planning Concerns Readmission within the last 30 days:  No Current discharge risk:  Dependent with Mobility Barriers to Discharge:  Continued Medical Work up   [employer], Kristi Beets, LCSW 04/17/2017, 11:41 AM

## 2017-04-17 NOTE — Progress Notes (Signed)
Inpatient Diabetes Program Recommendations  AACE/ADA: New Consensus Statement on Inpatient Glycemic Control (2015)  Target Ranges:  Prepandial:   less than 140 mg/dL      Peak postprandial:   less than 180 mg/dL (1-2 hours)      Critically ill patients:  140 - 180 mg/dL   Results for Kristi [name], Kristi [name] (MRN [id number]) as of 04/17/2017 09:31  Ref. Range 04/16/2017 08:24 04/16/2017 12:30 04/16/2017 16:07 04/16/2017 21:42  Glucose-Capillary Latest Ref Range: 65 - 99 mg/dL 115 (H) 181 (H) 212 (H) 194 (H)    Review of Glycemic Control  Diabetes history: No  Current orders: Novolog Moderate Correction Scale/ SSI (0-15 units) TID AC + HS   Novolog 3 units TID   Metformin 500 mg BID   Amaryl 1 mg daily       MD- Please consider increasing Metformin to 1000 mg BID      --Will follow patient during hospitalization--  Wyn Quaker RN, MSN, CDE Diabetes Coordinator Inpatient Glycemic Control Team Team Pager: [phone] (8a-5p)

## 2017-04-17 NOTE — Progress Notes (Addendum)
MD- Please consider increasing Metformin to 1000 mg BID     Met with pt again today to discuss new diagnosis of DM.  RNs have been working with patient on learning insulin injections just in case decision made to send pt home on insulin.  Discussed Metformin and Amaryl with pt today.  Explained what these 2 meds are, how they work, side effects, when to take, etc.  Also discussed Hypoglycemia with patient (symptoms, treatment, re-check of CBGs, etc).  Encouraged pt to make a follow up appt with her PCP within a week after d/c to discuss new diagnosis of DM with PCP and to assist with further CBG management.  Have asked RNs to allow pt to check CBGs here in hospital to get practice checking fingerstick CBGs as well.     --Will follow patient during hospitalization--  Wyn Quaker RN, MSN, CDE Diabetes Coordinator Inpatient Glycemic Control Team Team Pager: [phone] (8a-5p)

## 2017-04-17 NOTE — Progress Notes (Addendum)
[facility] [place] NAME: Kristi [name]    MR#:  [id number]  DATE OF BIRTH:  July 15, 1940  SUBJECTIVE:  CHIEF COMPLAINT:   Chief Complaint  Patient presents with  . Loss of Consciousness  . Weakness   - Doing well, still complains of difficulty swallowing.. -Scheduled for EGD tomorrow, feels like air bubble in her chest  REVIEW OF SYSTEMS:  Review of Systems  Constitutional: Positive for malaise/fatigue. Negative for chills and fever.  HENT: Negative for congestion, ear discharge, hearing loss and nosebleeds.   Eyes: Negative for blurred vision and double vision.  Respiratory: Negative for cough, shortness of breath and wheezing.   Cardiovascular: Negative for chest pain, palpitations and leg swelling.  Gastrointestinal: Negative for abdominal pain, constipation, diarrhea, nausea and vomiting.       Dysphagia  Genitourinary: Negative for dysuria and urgency.  Musculoskeletal: Positive for falls.  Neurological: Positive for weakness. Negative for dizziness, sensory change, speech change, focal weakness, seizures and headaches.  Psychiatric/Behavioral: Negative for depression.    DRUG ALLERGIES:   Allergies  Allergen Reactions  . Penicillins Anaphylaxis  . Penicillin G Swelling and Rash    Has patient had a PCN reaction causing immediate rash, facial/tongue/throat swelling, SOB or lightheadedness with hypotension: Yes Has patient had a PCN reaction causing severe rash involving mucus membranes or skin necrosis: No Has patient had a PCN reaction that required hospitalization: No Has patient had a PCN reaction occurring within the last 10 years: No If all of the above answers are "NO", then may proceed with Cephalosporin use.     VITALS:  Blood pressure 106/72, pulse 90, temperature 98.4 F (36.9 C), temperature source Oral, resp. rate 18, height 5\' 7"  (1.702 m), weight 83.5 kg (184 lb), SpO2 97 %.  PHYSICAL EXAMINATION:    Physical Exam  GENERAL:  77 y.o.-year-old elderly patient lying in the bed with no acute distress.  EYES: Pupils equal, round, reactive to light and accommodation. No scleral icterus. Extraocular muscles intact.  HEENT: Head atraumatic, normocephalic. Oropharynx and nasopharynx clear.  NECK:  Supple, no jugular venous distention. No thyroid enlargement, no tenderness.  LUNGS: Normal breath sounds bilaterally, no wheezing, rales,rhonchi or crepitation. No use of accessory muscles of respiration.  CARDIOVASCULAR: S1, S2 normal. No  rubs, or gallops. 2/6 systolic murmur present ABDOMEN: Soft, nontender, nondistended. Bowel sounds present. No organomegaly or mass.  EXTREMITIES: No pedal edema, cyanosis, or clubbing.  NEUROLOGIC: Cranial nerves II through XII are intact. Muscle strength 5/5 in all extremities. Sensation intact. Gait not checked.  PSYCHIATRIC: The patient is alert and oriented x 3.  SKIN: No obvious rash, lesion, or ulcer.    LABORATORY PANEL:   CBC Recent Labs  Lab 04/16/17 0638  WBC 5.6  HGB 10.9*  HCT 32.6*  PLT 139*   ------------------------------------------------------------------------------------------------------------------  Chemistries  Recent Labs  Lab 04/10/17 2305  04/16/17 0638  NA 129*   < > 139  K 3.8   < > 3.4*  CL 87*   < > 106  CO2 30   < > 26  GLUCOSE 708*   < > 123*  BUN 19   < > 7  CREATININE 0.99   < > 0.80  CALCIUM 8.9   < > 8.4*  AST 49*  --   --   ALT 6*  --   --   ALKPHOS 151*  --   --   BILITOT 0.7  --   --    < > =  values in this interval not displayed.   ------------------------------------------------------------------------------------------------------------------  Cardiac Enzymes Recent Labs  Lab 04/11/17 1811  TROPONINI 0.05*   ------------------------------------------------------------------------------------------------------------------  RADIOLOGY:  No results found.  EKG:   Orders placed or performed  during the hospital encounter of 04/10/17  . ED EKG  . ED EKG  . EKG 12-Lead  . EKG 12-Lead    ASSESSMENT AND PLAN:   77 year old female with past medical history significant for coronary artery disease, hypertension, Parkinson's disease, spinal stenosis, anxiety presents to the hospital secondary to a fall  1. Fall-questionable syncope. Noncardiac. -From weakness and possibly vasovagal. Concern for an acute cystitis -Improved with IV fluids. Being treated for UTI-stop antibiotics after today's dose Physical therapy recommended rehabilitation, patient interested in going back home with home health.  2. 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 -GI consult is appreciated. -For EGD tomorrow. Last use of Plavix was 4 days ago  3. CAD status post bypass-stable. Appreciate cardiology consult. Last stress test in July 2018 was normal. EF of 52%. Cardiology has cleared her for the procedure. -Continue cardiac medications  4. Diabetes mellitus-on Amaryl, metformin and sliding scale insulin   5. Depression and anxiety-continue home medications.  6. DVT prophylaxis-continue Lovenox  7. Hypokalemia-replaced   Physical therapy consulted. Needs home health. Refused rehab   All the records are reviewed and case discussed with Care Management/Social Workerr. Management plans discussed with the patient, family and they are in agreement.  CODE STATUS: Full code  TOTAL TIME TAKING CARE OF THIS PATIENT: 27 minutes.   POSSIBLE D/C TOMORROW, DEPENDING ON CLINICAL CONDITION.   Gladstone Lighter M.D on 04/17/2017 at 12:29 PM  Between 7am to 6pm - Pager - [phone]  After 6pm go to [email or web address] - password [email or web address] [facility]  Office  [phone]  CC: Primary care physician; Lorelee Market, MD

## 2017-04-17 NOTE — Progress Notes (Signed)
SUBJECTIVE: Mild shortness of breath with mild intermittent chest pain reported.    Vitals:   04/17/17 0500 04/17/17 0822 04/17/17 1213 04/17/17 1217  BP:  129/74 106/72   Pulse:  66 90   Resp:  18    Temp:  99 F (37.2 C) 98.4 F (36.9 C)   TempSrc:  Oral Oral   SpO2:  100% 93% 97%  Weight: 184 lb (83.5 kg)     Height:        Intake/Output Summary (Last 24 hours) at 04/17/2017 1301 Last data filed at 04/17/2017 1200 Gross per 24 hour  Intake 840 ml  Output 900 ml  Net -60 ml    LABS: Basic Metabolic Panel: Recent Labs    04/16/17 0638  NA 139  K 3.4*  CL 106  CO2 26  GLUCOSE 123*  BUN 7  CREATININE 0.80  CALCIUM 8.4*   Liver Function Tests: No results for input(s): AST, ALT, ALKPHOS, BILITOT, PROT, ALBUMIN in the last 72 hours. No results for input(s): LIPASE, AMYLASE in the last 72 hours. CBC: Recent Labs    04/16/17 0638  WBC 5.6  HGB 10.9*  HCT 32.6*  MCV 95.0  PLT 139*   Cardiac Enzymes: No results for input(s): CKTOTAL, CKMB, CKMBINDEX, TROPONINI in the last 72 hours. BNP: Invalid input(s): POCBNP D-Dimer: No results for input(s): DDIMER in the last 72 hours. Hemoglobin A1C: No results for input(s): HGBA1C in the last 72 hours. Fasting Lipid Panel: No results for input(s): CHOL, HDL, LDLCALC, TRIG, CHOLHDL, LDLDIRECT in the last 72 hours. Thyroid Function Tests: No results for input(s): TSH, T4TOTAL, T3FREE, THYROIDAB in the last 72 hours.  Invalid input(s): FREET3 Anemia Panel: No results for input(s): VITAMINB12, FOLATE, FERRITIN, TIBC, IRON, RETICCTPCT in the last 72 hours.   PHYSICAL EXAM General: Well developed, well nourished, in no acute distress HEENT:  Normocephalic and atramatic Neck:  No JVD.  Lungs: Clear bilaterally to auscultation and percussion. Heart: HRRR . Normal S1 and S2 without gallops or murmurs.  Abdomen: Bowel sounds are positive, abdomen soft and non-tender  Msk:  Back normal, normal gait. Normal strength and  tone for age. Extremities: No clubbing, cyanosis or edema.   Neuro: Alert and oriented X 3. Psych:  Good affect, responds appropriately  TELEMETRY: Not available  ASSESSMENT AND PLAN: Syncopal episode with history of CABG and stable coronary artery disease. Cleared for procedure from cardiac perspective.  Continue current medications including coreg and rosuvastatin.  Active Problems:   Syncope and collapse    Jake Bathe, NP-C 04/17/2017 1:01 PM Cell: [phone]

## 2017-04-17 NOTE — Progress Notes (Signed)
[name], MD [facility]   [address] [place], [place] [address] Phone: [phone] Fax : [phone]   Subjective: This patient was on Plavix and has had a history of dysphasia.  The patient could not be dilated due to her recent Plavix use.  The patient has now been off Plavix for enough time that we can plan to have her upper endoscopy tomorrow.  She does report that she has had a history of esophageal dilation approximately 8 years ago.  There is no report of any abdominal pain nausea or vomiting at this time.   Objective: Vital signs in last 24 hours: Vitals:   04/17/17 0500 04/17/17 0822 04/17/17 1213 04/17/17 1217  BP:  129/74 106/72   Pulse:  66 90   Resp:  18    Temp:  99 F (37.2 C) 98.4 F (36.9 C)   TempSrc:  Oral Oral   SpO2:  100% 93% 97%  Weight: 184 lb (83.5 kg)     Height:       Weight change: 0 lb (0 kg)  Intake/Output Summary (Last 24 hours) at 04/17/2017 1348 Last data filed at 04/17/2017 1200 Gross per 24 hour  Intake 840 ml  Output 900 ml  Net -60 ml     Exam: Heart:: Regular rate and rhythm, S1S2 present or without murmur or extra heart sounds Lungs: normal and clear to auscultation and percussion Abdomen: soft, nontender, normal bowel sounds   Lab Results: @LABTEST2 @ Micro Results: Recent Results (from the past 240 hour(s))  Urine Culture     Status: Abnormal   Collection Time: 04/10/17 11:05 PM  Result Value Ref Range Status   Specimen Description   Final    URINE, RANDOM Performed at [facility], [address] [place], [place] [address]    Special Requests   Final    NONE Performed at [facility], [address] [place],  [id number]    Culture >=100,000 COLONIES/mL ESCHERICHIA COLI (A)  Final   Report Status 04/13/2017 FINAL  Final   Organism ID, Bacteria ESCHERICHIA COLI (A)  Final      Susceptibility   Escherichia coli - MIC*    AMPICILLIN <=2 SENSITIVE Sensitive     CEFAZOLIN <=4  SENSITIVE Sensitive     CEFTRIAXONE <=1 SENSITIVE Sensitive     CIPROFLOXACIN <=0.25 SENSITIVE Sensitive     GENTAMICIN <=1 SENSITIVE Sensitive     IMIPENEM <=0.25 SENSITIVE Sensitive     NITROFURANTOIN <=16 SENSITIVE Sensitive     TRIMETH/SULFA <=20 SENSITIVE Sensitive     AMPICILLIN/SULBACTAM <=2 SENSITIVE Sensitive     PIP/TAZO <=4 SENSITIVE Sensitive     Extended ESBL NEGATIVE Sensitive     * >=100,000 COLONIES/mL ESCHERICHIA COLI   Studies/Results: No results found. Medications: I have reviewed the patient's current medications. Scheduled Meds: . carbidopa-levodopa  1 tablet Oral TID  . carvedilol  3.125 mg Oral BID WC  . cholecalciferol  1,000 Units Oral Daily  . docusate sodium  100 mg Oral BID  . enoxaparin (LOVENOX) injection  40 mg Subcutaneous Q24H  . feeding supplement (ENSURE ENLIVE)  237 mL Oral BID BM  . fluticasone  2 spray Each Nare Daily  . glimepiride  1 mg Oral Q breakfast  . insulin aspart  0-15 Units Subcutaneous TID WC  . insulin aspart  0-5 Units Subcutaneous QHS  . insulin aspart  3 Units Subcutaneous TID WC  . levothyroxine  75 mcg Oral QAC breakfast  .  linaclotide  145 mcg Oral Daily  . loratadine  10 mg Oral Daily  . megestrol  400 mg Oral Daily  . metFORMIN  500 mg Oral BID WC  . mupirocin ointment  1 application Nasal BID  . olopatadine  1 drop Both Eyes BID  . pantoprazole  40 mg Oral Daily  . rosuvastatin  20 mg Oral Daily  . sertraline  100 mg Oral Daily  . sertraline  50 mg Oral QHS  . sodium chloride flush  3 mL Intravenous Q12H  . vitamin B-12  500 mcg Oral Daily   Continuous Infusions: . cefTRIAXone (ROCEPHIN)  IV 1 g (04/16/17 1814)   PRN Meds:.acetaminophen **OR** acetaminophen, nitroGLYCERIN, ondansetron **OR** ondansetron (ZOFRAN) IV, oxyCODONE-acetaminophen, senna-docusate, traZODone   Assessment: Active Problems:   Syncope and collapse    Plan: This patient came in with syncope and weakness.  The patient has been doing  well but still complains of difficulty swallowing.  The patient could not have the EGD because of her use of Plavix.  The patient has now been off Plavix long enough to have her upper endoscopy.  The patient will be set up for the upper endoscopy for tomorrow.   LOS: 6 days   [name] 04/17/2017, 1:48 PM

## 2017-04-18 ENCOUNTER — Encounter: Payer: Self-pay | Admitting: Anesthesiology

## 2017-04-18 ENCOUNTER — Inpatient Hospital Stay: Payer: Medicare Other | Admitting: Anesthesiology

## 2017-04-18 ENCOUNTER — Encounter
Admission: EM | Disposition: A | Discharge status: Home Health | Payer: Self-pay | Source: Home / Self Care | Attending: Internal Medicine

## 2017-04-18 DIAGNOSIS — K222 Esophageal obstruction: Secondary | ICD-10-CM

## 2017-04-18 HISTORY — PX: ESOPHAGOGASTRODUODENOSCOPY (EGD) WITH PROPOFOL: SHX5813

## 2017-04-18 LAB — BASIC METABOLIC PANEL
Anion gap: 6 (ref 5–15)
BUN: 8 mg/dL (ref 6–20)
CO2: 25 mmol/L (ref 22–32)
Calcium: 8.5 mg/dL — ABNORMAL LOW (ref 8.9–10.3)
Chloride: 107 mmol/L (ref 101–111)
Creatinine, Ser: 0.72 mg/dL (ref 0.44–1.00)
GFR calc Af Amer: >60 mL/min (ref >60)
GFR calc non Af Amer: >60 mL/min (ref >60)
Glucose, Bld: 112 mg/dL — ABNORMAL HIGH (ref 65–99)
Potassium: 3.6 mmol/L (ref 3.5–5.1)
Sodium: 138 mmol/L (ref 135–145)

## 2017-04-18 LAB — GLUCOSE, CAPILLARY
Glucose-Capillary: 112 mg/dL — ABNORMAL HIGH (ref 65–99)
Glucose-Capillary: 105 mg/dL — ABNORMAL HIGH (ref 65–99)
Glucose-Capillary: 96 mg/dL (ref 65–99)

## 2017-04-18 SURGERY — ESOPHAGOGASTRODUODENOSCOPY (EGD) WITH PROPOFOL
Anesthesia: General

## 2017-04-18 MED ORDER — GLIMEPIRIDE 1 MG PO TABS: 1.0000 mg | ORAL_TABLET | Freq: Every day | ORAL | 2 refills | Status: DC

## 2017-04-18 MED ORDER — SODIUM CHLORIDE 0.9 % IV SOLN
INTRAVENOUS | Status: DC | PRN
  Administered 2017-04-18: 12:00:00 via INTRAVENOUS

## 2017-04-18 MED ORDER — LIDOCAINE HCL (PF) 1 % IJ SOLN
INTRAMUSCULAR | Status: AC
  Filled 2017-04-18: qty 2

## 2017-04-18 MED ORDER — METFORMIN HCL 500 MG PO TABS: 500.0000 mg | ORAL_TABLET | Freq: Two times a day (BID) | ORAL | 2 refills | Status: DC

## 2017-04-18 MED ORDER — LIDOCAINE HCL (CARDIAC) 20 MG/ML IV SOLN
INTRAVENOUS | Status: DC | PRN
  Administered 2017-04-18: 50 mg via INTRAVENOUS

## 2017-04-18 MED ORDER — PROPOFOL 10 MG/ML IV BOLUS
INTRAVENOUS | Status: DC | PRN
  Administered 2017-04-18: 40 mg via INTRAVENOUS

## 2017-04-18 MED ORDER — LIDOCAINE HCL (PF) 2 % IJ SOLN
INTRAMUSCULAR | Status: AC
  Filled 2017-04-18: qty 10

## 2017-04-18 MED ORDER — CARVEDILOL 3.125 MG PO TABS: 3.1250 mg | ORAL_TABLET | Freq: Two times a day (BID) | ORAL | 2 refills | Status: DC

## 2017-04-18 MED ORDER — PROPOFOL 500 MG/50ML IV EMUL
INTRAVENOUS | Status: DC | PRN
  Administered 2017-04-18: 160 ug/kg/min via INTRAVENOUS

## 2017-04-18 MED ORDER — PROPOFOL 500 MG/50ML IV EMUL
INTRAVENOUS | Status: AC
  Filled 2017-04-18: qty 50

## 2017-04-18 NOTE — Anesthesia Procedure Notes (Signed)
Date/Time: 04/18/2017 12:21 PM Performed by: Johnna Acosta, CRNA Pre-anesthesia Checklist: Patient identified, Emergency Drugs available, Suction available, Patient being monitored and Timeout performed Patient Re-evaluated:Patient Re-evaluated prior to induction Oxygen Delivery Method: Nasal cannula Preoxygenation: Pre-oxygenation with 100% oxygen

## 2017-04-18 NOTE — Progress Notes (Signed)
[facility] at [place] NAME: Kristi [name]    MR#:  [id number]  DATE OF BIRTH:  10-06-1940  SUBJECTIVE:  CHIEF COMPLAINT:   Chief Complaint  Patient presents with  . Loss of Consciousness  . Weakness   - doing well, for EGD today  REVIEW OF SYSTEMS:  Review of Systems  Constitutional: Negative for chills, fever and malaise/fatigue.  HENT: Negative for congestion, ear discharge, hearing loss and nosebleeds.   Eyes: Negative for blurred vision and double vision.  Respiratory: Negative for cough, shortness of breath and wheezing.   Cardiovascular: Negative for chest pain, palpitations and leg swelling.  Gastrointestinal: Negative for abdominal pain, constipation, diarrhea, nausea and vomiting.       Dysphagia  Genitourinary: Negative for dysuria and urgency.  Musculoskeletal: Positive for falls.  Neurological: Positive for weakness. Negative for dizziness, sensory change, speech change, focal weakness, seizures and headaches.  Psychiatric/Behavioral: Negative for depression.    DRUG ALLERGIES:   Allergies  Allergen Reactions  . Penicillins Anaphylaxis  . Penicillin G Swelling and Rash    Has patient had a PCN reaction causing immediate rash, facial/tongue/throat swelling, SOB or lightheadedness with hypotension: Yes Has patient had a PCN reaction causing severe rash involving mucus membranes or skin necrosis: No Has patient had a PCN reaction that required hospitalization: No Has patient had a PCN reaction occurring within the last 10 years: No If all of the above answers are "NO", then may proceed with Cephalosporin use.     VITALS:  Blood pressure 116/61, pulse 66, temperature 98.9 F (37.2 C), temperature source Oral, resp. rate 16, height 5\' 7"  (1.702 m), weight 84.3 kg (185 lb 13.6 oz), SpO2 95 %.  PHYSICAL EXAMINATION:  Physical Exam  GENERAL:  77 y.o.-year-old elderly patient lying in the bed with no acute distress.    EYES: Pupils equal, round, reactive to light and accommodation. No scleral icterus. Extraocular muscles intact.  HEENT: Head atraumatic, normocephalic. Oropharynx and nasopharynx clear.  NECK:  Supple, no jugular venous distention. No thyroid enlargement, no tenderness.  LUNGS: Normal breath sounds bilaterally, no wheezing, rales,rhonchi or crepitation. No use of accessory muscles of respiration.  CARDIOVASCULAR: S1, S2 normal. No  rubs, or gallops. 2/6 systolic murmur present ABDOMEN: Soft, nontender, nondistended. Bowel sounds present. No organomegaly or mass.  EXTREMITIES: No pedal edema, cyanosis, or clubbing.  NEUROLOGIC: Cranial nerves II through XII are intact. Muscle strength 5/5 in all extremities. Sensation intact. Gait not checked.  PSYCHIATRIC: The patient is alert and oriented x 3.  SKIN: No obvious rash, lesion, or ulcer.    LABORATORY PANEL:   CBC Recent Labs  Lab 04/16/17 0638  WBC 5.6  HGB 10.9*  HCT 32.6*  PLT 139*   ------------------------------------------------------------------------------------------------------------------  Chemistries  Recent Labs  Lab 04/18/17 0331  NA 138  K 3.6  CL 107  CO2 25  GLUCOSE 112*  BUN 8  CREATININE 0.72  CALCIUM 8.5*   ------------------------------------------------------------------------------------------------------------------  Cardiac Enzymes Recent Labs  Lab 04/11/17 1811  TROPONINI 0.05*   ------------------------------------------------------------------------------------------------------------------  RADIOLOGY:  No results found.  EKG:   Orders placed or performed during the hospital encounter of 04/10/17  . ED EKG  . ED EKG  . EKG 12-Lead  . EKG 12-Lead    ASSESSMENT AND PLAN:   77 year old female with past medical history significant for coronary artery disease, hypertension, Parkinson's disease, spinal stenosis, anxiety presents to the hospital secondary to a fall  1.  Fall-questionable syncope. Noncardiac. -From weakness and vasovagal. Concern for an acute cystitis -Improved with IV fluids. treated for UTI- finish Rocephin. Physical therapy recommended rehabilitation, patient interested in going back home with home health.  2. 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 -GI consult is appreciated. -For EGD today. Last use of Plavix was 5 days ago  3. CAD status post bypass-stable. Appreciate cardiology consult. Last stress test in July 2018 was normal. EF of 52%. Cardiology has cleared her for the procedure. -Continue cardiac medications  4. Diabetes mellitus- new diagnosis. A1c is 12.3 -Will be discharged on Amaryl and metformin  5. Depression and anxiety-continue home medications.  6. DVT prophylaxis-continue Lovenox  7. Hypokalemia-replaced   Physical therapy consulted. Needs home health. Refused rehab   All the records are reviewed and case discussed with Care Management/Social Workerr. Management plans discussed with the patient, family and they are in agreement.  CODE STATUS: Full code  TOTAL TIME TAKING CARE OF THIS PATIENT: 36 minutes.   POSSIBLE D/C TOMORROW, DEPENDING ON CLINICAL CONDITION.   Gladstone Lighter M.D on 04/18/2017 at 11:07 AM  Between 7am to 6pm - Pager - [phone]  After 6pm go to [email or web address] - password [email or web address] [facility]  Office  [phone]  CC: Primary care physician; Lorelee Market, MD

## 2017-04-18 NOTE — Progress Notes (Signed)
PT Cancellation Note:  Pt currently off floor for testing.  Will re-attempt PT treatment at a later date/time.  Leitha Bleak, PT 04/18/17, 11:22 AM [phone]

## 2017-04-18 NOTE — Anesthesia Postprocedure Evaluation (Signed)
Anesthesia Post Note  [name]  Procedure(s) Performed: ESOPHAGOGASTRODUODENOSCOPY (EGD) WITH PROPOFOL (N/A )  Patient location during evaluation: PACU Anesthesia Type: General Level of consciousness: awake and alert and oriented Pain management: pain level controlled Vital Signs Assessment: post-procedure vital signs reviewed and stable Respiratory status: spontaneous breathing Cardiovascular status: blood pressure returned to baseline Anesthetic complications: no     Last Vitals:  Vitals:   04/18/17 1308 04/18/17 1337  BP: 125/66 124/66  Pulse: 60 65  Resp: 16   Temp:  37.2 C  SpO2: 100% 100%    Last Pain:  Vitals:   04/18/17 1337  TempSrc: Oral  PainSc: 0-No pain                 Bennett Vanscyoc

## 2017-04-18 NOTE — Anesthesia Preprocedure Evaluation (Signed)
Anesthesia Evaluation  Patient identified by MRN, date of birth, ID band Patient awake    Reviewed: Allergy & Precautions, NPO status , Patient's Chart, lab work & pertinent test results, reviewed documented beta blocker date and time   Airway Mallampati: III  TM Distance: <3 FB     Dental  (+) Chipped   Pulmonary neg pulmonary ROS,    Pulmonary exam normal        Cardiovascular hypertension, Pt. on medications and Pt. on home beta blockers + CAD, + Past MI and + Peripheral Vascular Disease  Normal cardiovascular exam     Neuro/Psych PSYCHIATRIC DISORDERS Anxiety Depression Parkinsons dz TIA Neuromuscular disease CVA    GI/Hepatic Neg liver ROS, GERD  Medicated,  Endo/Other  Hypothyroidism   Renal/GU negative Renal ROS     Musculoskeletal  (+) Arthritis , Osteoarthritis,    Abdominal Normal abdominal exam  (+)   Peds negative pediatric ROS (+)  Hematology negative hematology ROS (+)   Anesthesia Other Findings Past Medical History: No date: Anxiety No date: Broken arm     Comment:  left FA 3/17 No date: Broken arm     Comment:  left No date: CAD (coronary artery disease)     Comment:  s/p MI No date: Depression No date: Hyperlipidemia No date: Hypertension No date: Hypothyroid 06/04/2014: Neck pain No date: Parkinson's disease ([place]) No date: Spinal stenosis  Reproductive/Obstetrics                             Anesthesia Physical Anesthesia Plan  ASA: III  Anesthesia Plan: General   Post-op Pain Management:    Induction: Intravenous  PONV Risk Score and Plan:   Airway Management Planned: Nasal Cannula  Additional Equipment:   Intra-op Plan:   Post-operative Plan:   Informed Consent: I have reviewed the patients History and Physical, chart, labs and discussed the procedure including the risks, benefits and alternatives for the proposed anesthesia with the patient or  authorized representative who has indicated his/her understanding and acceptance.   Dental advisory given  Plan Discussed with: CRNA and Surgeon  Anesthesia Plan Comments:         Anesthesia Quick Evaluation

## 2017-04-18 NOTE — Progress Notes (Signed)
Inpatient Diabetes Program Recommendations  AACE/ADA: New Consensus Statement on Inpatient Glycemic Control (2015)  Target Ranges:  Prepandial:   less than 140 mg/dL      Peak postprandial:   less than 180 mg/dL (1-2 hours)      Critically ill patients:  140 - 180 mg/dL   Lab Results  Component Value Date   GLUCAP 112 (H) 04/18/2017   HGBA1C 12.3 (H) 04/12/2017    Review of Glycemic Control  Results for GUDRUN, AXE (MRN [id number]) as of 04/18/2017 07:46  Ref. Range 04/17/2017 07:56 04/17/2017 11:45 04/17/2017 16:40 04/17/2017 22:52 04/18/2017 05:08  Glucose-Capillary Latest Ref Range: 65 - 99 mg/dL 134 (H) 290 (H) 189 (H) 117 (H) 112 (H)    Diabetes history:New diagnosis diabetes  Current orders: Novolog Moderate Correction Scale/ SSI (0-15 units) TID AC , Novolog 0-5 units qhs                           Novolog 3 units TID                           Metformin 500 mg BID                           Amaryl 1 mg daily  NPO for endoscopy this am- discussed with RN- will hold Novolog 3 units and Amaryl- if necessary, based on CBG, please give sliding scale 0-15 units if needed before she goes to the procedure.  Please consider d/c Amaryl while inpatient- this patient has an inconsistent oral intake and risks hypoglycemia with this medication.  Gentry Fitz, RN, BA, MHA, CDE Diabetes Coordinator Inpatient Diabetes Program  [phone] (Team Pager) [phone] ([place]) 04/18/2017 7:51 AM           MD- Please consider increasing Metformin to 1000 mg BID

## 2017-04-18 NOTE — Progress Notes (Signed)
Pt ready for discharge home with family. Reviewed all discharge instructions prescriptions and f/u appointments.  pts daughter called back to unit to inquire about a glucose meter. Notified md.. Script to be called into pts pharmacy for meter. This Probation officer called back to inform patients daughter, Noe Gens they can pick up the meter at [employer] [facility] [employer].

## 2017-04-18 NOTE — Plan of Care (Signed)
Adequate for Discharge Education: Knowledge of General Education information will improve 04/18/2017 1509 - Adequate for Discharge by Milderd Meager, RN 04/18/2017 1124 - Adequate for Discharge by Milderd Meager, RN Health Behavior/Discharge Planning: Ability to manage health-related needs will improve 04/18/2017 1509 - Adequate for Discharge by Milderd Meager, RN 04/18/2017 1124 - Adequate for Discharge by Milderd Meager, RN Clinical Measurements: Ability to maintain clinical measurements within normal limits will improve 04/18/2017 1509 - Adequate for Discharge by Milderd Meager, RN 04/18/2017 1124 - Adequate for Discharge by Milderd Meager, RN Will remain free from infection 04/18/2017 1509 - Adequate for Discharge by Milderd Meager, RN 04/18/2017 1124 - Adequate for Discharge by Milderd Meager, RN Diagnostic test results will improve 04/18/2017 1509 - Adequate for Discharge by Milderd Meager, RN 04/18/2017 1124 - Adequate for Discharge by Milderd Meager, RN Respiratory complications will improve 04/18/2017 1509 - Adequate for Discharge by Milderd Meager, RN 04/18/2017 1124 - Adequate for Discharge by Milderd Meager, RN Cardiovascular complication will be avoided 04/18/2017 1509 - Adequate for Discharge by Milderd Meager, RN 04/18/2017 1124 - Adequate for Discharge by Milderd Meager, RN Activity: Risk for activity intolerance will decrease 04/18/2017 1509 - Adequate for Discharge by Milderd Meager, RN 04/18/2017 1124 - Adequate for Discharge by Milderd Meager, RN Nutrition: Adequate nutrition will be maintained 04/18/2017 1509 - Adequate for Discharge by Milderd Meager, RN 04/18/2017 1124 - Adequate for Discharge by Milderd Meager, RN Coping: Level of anxiety will decrease 04/18/2017 1509 - Adequate for Discharge by Milderd Meager, RN 04/18/2017 1124 - Adequate for Discharge by Milderd Meager, RN Elimination: Will  not experience complications related to bowel motility 04/18/2017 1509 - Adequate for Discharge by Milderd Meager, RN 04/18/2017 1124 - Adequate for Discharge by Milderd Meager, RN Will not experience complications related to urinary retention 04/18/2017 1509 - Adequate for Discharge by Milderd Meager, RN 04/18/2017 1124 - Adequate for Discharge by Milderd Meager, RN Pain Managment: General experience of comfort will improve 04/18/2017 1509 - Adequate for Discharge by Milderd Meager, RN 04/18/2017 1124 - Adequate for Discharge by Milderd Meager, RN Safety: Ability to remain free from injury will improve 04/18/2017 1509 - Adequate for Discharge by Milderd Meager, RN 04/18/2017 1124 - Adequate for Discharge by Milderd Meager, RN Skin Integrity: Risk for impaired skin integrity will decrease 04/18/2017 1509 - Adequate for Discharge by Milderd Meager, RN 04/18/2017 1124 - Adequate for Discharge by Milderd Meager, RN Education: Ability to describe self-care measures that may prevent or decrease complications (Diabetes Survival Skills Education) will improve 04/18/2017 1509 - Adequate for Discharge by Milderd Meager, RN 04/18/2017 1124 - Adequate for Discharge by Milderd Meager, RN Fluid Volume: Ability to maintain a balanced intake and output will improve 04/18/2017 1509 - Adequate for Discharge by Milderd Meager, RN 04/18/2017 1124 - Adequate for Discharge by Milderd Meager, RN Metabolic: Ability to maintain appropriate glucose levels will improve 04/18/2017 1509 - Adequate for Discharge by Milderd Meager, RN 04/18/2017 1124 - Adequate for Discharge by Milderd Meager, RN Nutritional: Maintenance of adequate nutrition will improve 04/18/2017 1509 - Adequate for Discharge by Milderd Meager, RN 04/18/2017 1124 - Adequate for Discharge by Milderd Meager, RN Progress toward achieving an optimal weight will improve 04/18/2017 1509 -  Adequate for Discharge by Milderd Meager, RN 04/18/2017  1124 - Adequate for Discharge by Milderd Meager, RN Tissue Perfusion: Adequacy of tissue perfusion will improve 04/18/2017 1509 - Adequate for Discharge by Milderd Meager, RN 04/18/2017 1124 - Adequate for Discharge by Milderd Meager, RN

## 2017-04-18 NOTE — Progress Notes (Signed)
SUBJECTIVE: No CP   Vitals:   04/17/17 2139 04/18/17 0146 04/18/17 0454 04/18/17 0500  BP: 101/65 121/65 122/78   Pulse: 64 67 67   Resp: 16 15 16    Temp: 98.6 F (37 C) 99.2 F (37.3 C) 99.2 F (37.3 C)   TempSrc: Oral Oral Oral   SpO2: 96% 94% 99%   Weight:    185 lb 13.6 oz (84.3 kg)  Height:        Intake/Output Summary (Last 24 hours) at 04/18/2017 0847 Last data filed at 04/18/2017 0454 Gross per 24 hour  Intake 1770 ml  Output 750 ml  Net 1020 ml    LABS: Basic Metabolic Panel: Recent Labs    04/16/17 0638 04/18/17 0331  NA 139 138  K 3.4* 3.6  CL 106 107  CO2 26 25  GLUCOSE 123* 112*  BUN 7 8  CREATININE 0.80 0.72  CALCIUM 8.4* 8.5*   Liver Function Tests: No results for input(s): AST, ALT, ALKPHOS, BILITOT, PROT, ALBUMIN in the last 72 hours. No results for input(s): LIPASE, AMYLASE in the last 72 hours. CBC: Recent Labs    04/16/17 0638  WBC 5.6  HGB 10.9*  HCT 32.6*  MCV 95.0  PLT 139*   Cardiac Enzymes: No results for input(s): CKTOTAL, CKMB, CKMBINDEX, TROPONINI in the last 72 hours. BNP: Invalid input(s): POCBNP D-Dimer: No results for input(s): DDIMER in the last 72 hours. Hemoglobin A1C: No results for input(s): HGBA1C in the last 72 hours. Fasting Lipid Panel: No results for input(s): CHOL, HDL, LDLCALC, TRIG, CHOLHDL, LDLDIRECT in the last 72 hours. Thyroid Function Tests: No results for input(s): TSH, T4TOTAL, T3FREE, THYROIDAB in the last 72 hours.  Invalid input(s): FREET3 Anemia Panel: No results for input(s): VITAMINB12, FOLATE, FERRITIN, TIBC, IRON, RETICCTPCT in the last 72 hours.   PHYSICAL EXAM General: Well developed, well nourished, in no acute distress HEENT:  Normocephalic and atramatic Neck:  No JVD.  Lungs: Clear bilaterally to auscultation and percussion. Heart: HRRR . Normal S1 and S2 without gallops or murmurs.  Abdomen: Bowel sounds are positive, abdomen soft and non-tender  Msk:  Back normal, normal  gait. Normal strength and tone for age. Extremities: No clubbing, cyanosis or edema.   Neuro: Alert and oriented X 3. Psych:  Good affect, responds appropriately  TELEMETRY: NSR  ASSESSMENT AND PLAN:  Syncopal episode with history of CABG and stable coronary artery disease. Cleared for procedure from cardiac perspective.  Continue current medications including coreg and rosuvastatin.     Active Problems:   Syncope and collapse   Dysphagia    Kristi David, MD, [facility] 04/18/2017 8:47 AM

## 2017-04-18 NOTE — Discharge Summary (Signed)
[place] [facility] [place] NAME: Kristi [name]    MR#:  [id number]  DATE OF BIRTH:  March 10, 1941  DATE OF ADMISSION:  04/10/2017   ADMITTING PHYSICIAN: Saundra Shelling, MD  DATE OF DISCHARGE: 04/18/2017  PRIMARY CARE PHYSICIAN: Lorelee Market, MD   ADMISSION DIAGNOSIS:   Dizziness [R42] Weakness [R53.1] Hyperglycemia [R73.9] Syncope, unspecified syncope type [R55]  DISCHARGE DIAGNOSIS:   Active Problems:   Syncope and collapse   Dysphagia   Stricture and stenosis of esophagus   SECONDARY DIAGNOSIS:   Past Medical History:  Diagnosis Date  . Anxiety   . Broken arm    left FA 3/17  . Broken arm    left  . CAD (coronary artery disease)    s/p MI  . Depression   . Hyperlipidemia   . Hypertension   . Hypothyroid   . Neck pain 06/04/2014  . Parkinson's disease ([place])   . Spinal stenosis     HOSPITAL COURSE:   77 year old female with past medical history significant for coronary artery disease, hypertension, Parkinson's disease, spinal stenosis, anxiety presents to the hospital secondary to a fall  1. Fall-questionable syncope. Noncardiac. -From weakness and vasovagal. Concern for an acute cystitis -Improved with IV fluids. treated for UTI- finished Rocephin. Physical therapy recommended rehabilitation, patient interested in going back home with home health.  2. 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 -GI consult is appreciated. -Status post EGD and patient had only minor stricture in the large balloon was able to pass without any dilatation. -Encouraged to take PPI at home  3. CAD status post bypass-stable. Appreciate cardiology consult. Last stress test in July 2018 was normal. EF of 52%. Cardiology has cleared her for the procedure. -Continue cardiac medications  4. Diabetes mellitus- new diagnosis. A1c is 12.3 -Will be discharged on Amaryl and metformin  5.  Depression and anxiety-continue home medications.  6. Hypokalemia-replaced   Physical therapy consulted. Needs home health. Refused rehab Discharge today    DISCHARGE CONDITIONS:   Guarded CONSULTS OBTAINED:   Treatment Team:  Jonathon Bellows, MD Dionisio David, MD  DRUG ALLERGIES:   Allergies  Allergen Reactions  . Penicillins Anaphylaxis  . Penicillin G Swelling and Rash    Has patient had a PCN reaction causing immediate rash, facial/tongue/throat swelling, SOB or lightheadedness with hypotension: Yes Has patient had a PCN reaction causing severe rash involving mucus membranes or skin necrosis: No Has patient had a PCN reaction that required hospitalization: No Has patient had a PCN reaction occurring within the last 10 years: No If all of the above answers are "NO", then may proceed with Cephalosporin use.    DISCHARGE MEDICATIONS:   Allergies as of 04/18/2017      Reactions   Penicillins Anaphylaxis   Penicillin G Swelling, Rash   Has patient had a PCN reaction causing immediate rash, facial/tongue/throat swelling, SOB or lightheadedness with hypotension: Yes Has patient had a PCN reaction causing severe rash involving mucus membranes or skin necrosis: No Has patient had a PCN reaction that required hospitalization: No Has patient had a PCN reaction occurring within the last 10 years: No If all of the above answers are "NO", then may proceed with Cephalosporin use.      Medication List    STOP taking these medications   furosemide 40 MG tablet Commonly known as:  LASIX   gabapentin 300 MG capsule Commonly known as:  NEURONTIN   lamoTRIgine  100 MG tablet Commonly known as:  LAMICTAL   mupirocin ointment 2 % Commonly known as:  BACTROBAN   NONFORMULARY OR COMPOUNDED ITEM   potassium chloride SA 20 MEQ tablet Commonly known as:  K-DUR,KLOR-CON   tiZANidine 2 MG tablet Commonly known as:  ZANAFLEX     TAKE these medications   ALIVE WOMENS ENERGY  PO Take 1 tablet by mouth daily.   aspirin EC 81 MG tablet Take 81 mg by mouth daily.   carbidopa-levodopa 25-100 MG tablet Commonly known as:  SINEMET IR Take by mouth.   carvedilol 3.125 MG tablet Commonly known as:  COREG Take 1 tablet (3.125 mg total) by mouth 2 (two) times daily with a meal. What changed:    medication strength  how much to take  how to take this  when to take this   cholecalciferol 1000 units tablet Commonly known as:  VITAMIN D Take 1,000 Units by mouth daily. Reported on 07/08/2015   clonazePAM 0.5 MG tablet Commonly known as:  KLONOPIN Take 0.5 mg by mouth 2 (two) times daily.   clopidogrel 75 MG tablet Commonly known as:  PLAVIX   docusate sodium 100 MG capsule Commonly known as:  COLACE Take 100 mg by mouth 2 (two) times daily.   fluticasone 50 MCG/ACT nasal spray Commonly known as:  FLONASE Place 2 sprays into both nostrils daily.   glimepiride 1 MG tablet Commonly known as:  AMARYL Take 1 tablet (1 mg total) by mouth daily with breakfast. Start taking on:  04/19/2017   levothyroxine 75 MCG tablet Commonly known as:  SYNTHROID, LEVOTHROID What changed:  Another medication with the same name was removed. Continue taking this medication, and follow the directions you see here.   linaclotide 145 MCG Caps capsule Commonly known as:  LINZESS Take 145 mcg by mouth daily.   loratadine 10 MG tablet Commonly known as:  CLARITIN Take 10 mg by mouth daily.   metFORMIN 500 MG tablet Commonly known as:  GLUCOPHAGE Take 1 tablet (500 mg total) by mouth 2 (two) times daily with a meal.   nitroGLYCERIN 0.4 MG SL tablet Commonly known as:  NITROSTAT Place 0.4 mg under the tongue every 5 (five) minutes as needed for chest pain.   olopatadine 0.1 % ophthalmic solution Commonly known as:  PATANOL 1 drop 2 (two) times daily. What changed:  Another medication with the same name was removed. Continue taking this medication, and follow the  directions you see here.   omeprazole 20 MG capsule Commonly known as:  PRILOSEC Take 20 mg by mouth daily.   oxyCODONE-acetaminophen 5-325 MG tablet Commonly known as:  PERCOCET/ROXICET Take 1 tablet by mouth every 6 (six) hours as needed for severe pain.   rosuvastatin 20 MG tablet Commonly known as:  CRESTOR Take 20 mg by mouth daily.   sertraline 100 MG tablet Commonly known as:  ZOLOFT Take 50-100 mg by mouth 2 (two) times daily. Take 100 mg in the am and 50 mg at bedtime.   thiothixene 5 MG capsule Commonly known as:  NAVANE Take 5 mg by mouth 2 (two) times daily.   traMADol-acetaminophen 37.5-325 MG tablet Commonly known as:  ULTRACET Take 1 tablet by mouth every 6 (six) hours as needed for pain.   traZODone 50 MG tablet Commonly known as:  DESYREL Take 50 mg by mouth at bedtime as needed for sleep.   vitamin B-12 500 MCG tablet Commonly known as:  CYANOCOBALAMIN Take 500 mcg by mouth  daily.        DISCHARGE INSTRUCTIONS:   1.  PCP follow-up in 1-2 weeksFor  DIET:   Cardiac diet  ACTIVITY:   Activity as tolerated  OXYGEN:   Home Oxygen: No.  Oxygen Delivery: room air  DISCHARGE LOCATION:   home   If you experience worsening of your admission symptoms, develop shortness of breath, life threatening emergency, suicidal or homicidal thoughts you must seek medical attention immediately by calling 911 or calling your MD immediately  if symptoms less severe.  You Must read complete instructions/literature along with all the possible adverse reactions/side effects for all the Medicines you take and that have been prescribed to you. Take any new Medicines after you have completely understood and accpet all the possible adverse reactions/side effects.   Please note  You were cared for by a hospitalist during your hospital stay. If you have any questions about your discharge medications or the care you received while you were in the hospital after you are  discharged, you can call the unit and asked to speak with the hospitalist on call if the hospitalist that took care of you is not available. Once you are discharged, your primary care physician will handle any further medical issues. Please note that NO REFILLS for any discharge medications will be authorized once you are discharged, as it is imperative that you return to your primary care physician (or establish a relationship with a primary care physician if you do not have one) for your aftercare needs so that they can reassess your need for medications and monitor your lab values.    On the day of Discharge:  VITAL SIGNS:   Blood pressure 124/66, pulse 65, temperature 99 F (37.2 C), temperature source Oral, resp. rate 16, height 5\' 7"  (1.702 m), weight 84.3 kg (185 lb 13.6 oz), SpO2 100 %.  PHYSICAL EXAMINATION:   GENERAL:  77 y.o.-year-old elderly patient lying in the bed with no acute distress.  EYES: Pupils equal, round, reactive to light and accommodation. No scleral icterus. Extraocular muscles intact.  HEENT: Head atraumatic, normocephalic. Oropharynx and nasopharynx clear.  NECK:  Supple, no jugular venous distention. No thyroid enlargement, no tenderness.  LUNGS: Normal breath sounds bilaterally, no wheezing, rales,rhonchi or crepitation. No use of accessory muscles of respiration.  CARDIOVASCULAR: S1, S2 normal. No  rubs, or gallops. 2/6 systolic murmur present ABDOMEN: Soft, nontender, nondistended. Bowel sounds present. No organomegaly or mass.  EXTREMITIES: No pedal edema, cyanosis, or clubbing.  NEUROLOGIC: Cranial nerves II through XII are intact. Muscle strength 5/5 in all extremities. Sensation intact. Gait not checked.  PSYCHIATRIC: The patient is alert and oriented x 3.  SKIN: No obvious rash, lesion, or ulcer.   [name]   DATA REVIEW:   CBC Recent Labs  Lab 04/16/17 0638  WBC 5.6  HGB 10.9*  HCT 32.6*  PLT 139*    Chemistries  Recent Labs  Lab 04/18/17 0331    NA 138  K 3.6  CL 107  CO2 25  GLUCOSE 112*  BUN 8  CREATININE 0.72  CALCIUM 8.5*     Microbiology Results  Results for orders placed or performed during the hospital encounter of 04/10/17  Urine Culture     Status: Abnormal   Collection Time: 04/10/17 11:05 PM  Result Value Ref Range Status   Specimen Description   Final    URINE, RANDOM Performed at [facility], [address], [place], [place] [address]    Special Requests  Final    NONE Performed at [facility], [address] [place], [place] [address]    Culture >=100,000 COLONIES/mL ESCHERICHIA COLI (A)  Final   Report Status 04/13/2017 FINAL  Final   Organism ID, Bacteria ESCHERICHIA COLI (A)  Final      Susceptibility   Escherichia coli - MIC*    AMPICILLIN <=2 SENSITIVE Sensitive     CEFAZOLIN <=4 SENSITIVE Sensitive     CEFTRIAXONE <=1 SENSITIVE Sensitive     CIPROFLOXACIN <=0.25 SENSITIVE Sensitive     GENTAMICIN <=1 SENSITIVE Sensitive     IMIPENEM <=0.25 SENSITIVE Sensitive     NITROFURANTOIN <=16 SENSITIVE Sensitive     TRIMETH/SULFA <=20 SENSITIVE Sensitive     AMPICILLIN/SULBACTAM <=2 SENSITIVE Sensitive     PIP/TAZO <=4 SENSITIVE Sensitive     Extended ESBL NEGATIVE Sensitive     * >=100,000 COLONIES/mL ESCHERICHIA COLI    RADIOLOGY:  No results found.   Management plans discussed with the patient, family and they are in agreement.  CODE STATUS:     Code Status Orders  (From admission, onward)        Start     Ordered   04/11/17 0603  Full code  Continuous     04/11/17 0602    Code Status History    Date Active Date Inactive Code Status Order ID Comments User Context   12/24/2016 06:40 12/26/2016 20:20 Full Code [id number]  Harrie Foreman, MD ED   11/17/2016 04:50 11/17/2016 19:54 Full Code [id number]  Saundra Shelling, MD Inpatient   09/29/2016 20:02 09/30/2016 16:05 Full Code [id number]  Epifanio Lesches, MD ED    Advance Directive Documentation     Most  Recent Value  Type of Advance Directive  Healthcare Power of Attorney, Living will  Pre-existing out of facility DNR order (yellow form or pink MOST form)  No data  "MOST" Form in Place?  No data      TOTAL TIME TAKING CARE OF THIS PATIENT: 38 minutes.    Gladstone Lighter M.D on 04/18/2017 at 2:44 PM  Between 7am to 6pm - Pager - [phone]  After 6pm go to [email or web address] - Proofreader  [facility]  Office  [phone]  CC: Primary care physician; Lorelee Market, MD   Note: This dictation was prepared with Dragon dictation along with smaller phrase technology. Any transcriptional errors that result from this process are unintentional.

## 2017-04-18 NOTE — Plan of Care (Signed)
  Education: Knowledge of General Education information will improve 04/18/2017 0557 - Progressing by Evanna Washinton, Lucille Passy, RN   Health Behavior/Discharge Planning: Ability to manage health-related needs will improve 04/18/2017 0557 - Progressing by Jarone Ostergaard, Lucille Passy, RN   Clinical Measurements: Ability to maintain clinical measurements within normal limits will improve 04/18/2017 0557 - Progressing by Nanami Whitelaw, Lucille Passy, RN Will remain free from infection 04/18/2017 0557 - Progressing by Eiliyah Reh, Lucille Passy, RN Diagnostic test results will improve 04/18/2017 0557 - Progressing by Dimitria Ketchum, Lucille Passy, RN Respiratory complications will improve 04/18/2017 0557 - Progressing by Bryna Colander, RN Cardiovascular complication will be avoided 04/18/2017 0557 - Progressing by Bryna Colander, RN   Activity: Risk for activity intolerance will decrease 04/18/2017 0557 - Progressing by Bryna Colander, RN   Nutrition: Adequate nutrition will be maintained 04/18/2017 0557 - Progressing by Bryna Colander, RN   Coping: Level of anxiety will decrease 04/18/2017 0557 - Progressing by Aniyah Nobis, Lucille Passy, RN

## 2017-04-18 NOTE — Anesthesia Post-op Follow-up Note (Signed)
Anesthesia QCDR form completed.        

## 2017-04-18 NOTE — Plan of Care (Signed)
  Adequate for Discharge Education: Knowledge of General Education information will improve 04/18/2017 1124 - Adequate for Discharge by Milderd Meager, RN Health Behavior/Discharge Planning: Ability to manage health-related needs will improve 04/18/2017 1124 - Adequate for Discharge by Milderd Meager, RN Clinical Measurements: Ability to maintain clinical measurements within normal limits will improve 04/18/2017 1124 - Adequate for Discharge by Milderd Meager, RN Will remain free from infection 04/18/2017 1124 - Adequate for Discharge by Milderd Meager, RN Diagnostic test results will improve 04/18/2017 1124 - Adequate for Discharge by Milderd Meager, RN Respiratory complications will improve 04/18/2017 1124 - Adequate for Discharge by Milderd Meager, RN Cardiovascular complication will be avoided 04/18/2017 1124 - Adequate for Discharge by Milderd Meager, RN Activity: Risk for activity intolerance will decrease 04/18/2017 1124 - Adequate for Discharge by Milderd Meager, RN Nutrition: Adequate nutrition will be maintained 04/18/2017 1124 - Adequate for Discharge by Milderd Meager, RN Coping: Level of anxiety will decrease 04/18/2017 1124 - Adequate for Discharge by Milderd Meager, RN Elimination: Will not experience complications related to bowel motility 04/18/2017 1124 - Adequate for Discharge by Milderd Meager, RN Will not experience complications related to urinary retention 04/18/2017 1124 - Adequate for Discharge by Milderd Meager, RN Pain Managment: General experience of comfort will improve 04/18/2017 1124 - Adequate for Discharge by Milderd Meager, RN Safety: Ability to remain free from injury will improve 04/18/2017 1124 - Adequate for Discharge by Milderd Meager, RN Skin Integrity: Risk for impaired skin integrity will decrease 04/18/2017 1124 - Adequate for Discharge by Milderd Meager, RN Education: Ability to describe  self-care measures that may prevent or decrease complications (Diabetes Survival Skills Education) will improve 04/18/2017 1124 - Adequate for Discharge by Milderd Meager, RN Fluid Volume: Ability to maintain a balanced intake and output will improve 04/18/2017 1124 - Adequate for Discharge by Milderd Meager, RN Metabolic: Ability to maintain appropriate glucose levels will improve 04/18/2017 1124 - Adequate for Discharge by Milderd Meager, RN Nutritional: Maintenance of adequate nutrition will improve 04/18/2017 1124 - Adequate for Discharge by Milderd Meager, RN Progress toward achieving an optimal weight will improve 04/18/2017 1124 - Adequate for Discharge by Milderd Meager, RN Tissue Perfusion: Adequacy of tissue perfusion will improve 04/18/2017 1124 - Adequate for Discharge by Milderd Meager, RN

## 2017-04-18 NOTE — Transfer of Care (Signed)
Immediate Anesthesia Transfer of Care Note  [name]  Procedure(s) Performed: ESOPHAGOGASTRODUODENOSCOPY (EGD) WITH PROPOFOL (N/A )  Patient Location: PACU  Anesthesia Type:General  Level of Consciousness: sedated  Airway & Oxygen Therapy: Patient Spontanous Breathing and Patient connected to nasal cannula oxygen  Post-op Assessment: Report given to RN and Post -op Vital signs reviewed and stable  Post vital signs: Reviewed and stable  Last Vitals:  Vitals:   04/18/17 0853 04/18/17 1238  BP: 116/61 92/61  Pulse: 66 62  Resp: 18 16  Temp: 37.2 C (!) 36.1 C  SpO2: 95% 96%    Last Pain:  Vitals:   04/18/17 1238  TempSrc: Tympanic  PainSc:          Complications: No apparent anesthesia complications

## 2017-04-18 NOTE — Op Note (Signed)
[facility] Gastroenterology Patient Name: Kristi [name] Procedure Date: 04/18/2017 12:20 PM MRN: [id number] Account #: [id number] Date of Birth: 04/15/1940 Admit Type: Inpatient Age: 77 Room: [facility] ENDO ROOM 4 Gender: Female Note Status: Finalized Procedure:            Upper GI endoscopy Indications:          Dysphagia Providers:            [name] MD, MD Referring MD:         Meindert A. Brunetta Genera, MD (Referring MD) Medicines:            Propofol per Anesthesia Complications:        No immediate complications. Procedure:            Pre-Anesthesia Assessment:                       - Prior to the procedure, a History and Physical was                        performed, and patient medications and allergies were                        reviewed. The patient's tolerance of previous                        anesthesia was also reviewed. The risks and benefits of                        the procedure and the sedation options and risks were                        discussed with the patient. All questions were                        answered, and informed consent was obtained. Prior                        Anticoagulants: The patient has taken no previous                        anticoagulant or antiplatelet agents. ASA Grade                        Assessment: II - A patient with mild systemic disease.                        After reviewing the risks and benefits, the patient was                        deemed in satisfactory condition to undergo the                        procedure.                       After obtaining informed consent, the endoscope was                        passed under direct vision. Throughout the procedure,  the patient's blood pressure, pulse, and oxygen                        saturations were monitored continuously. The Endoscope                        was introduced through the mouth, and advanced to the                         second part of duodenum. The upper GI endoscopy was                        accomplished without difficulty. The patient tolerated                        the procedure well. Findings:      One mild benign-appearing, intrinsic stenosis was found at the       gastroesophageal junction. And was traversed. A TTS dilator was passed       through the scope. Dilation with a 15-16.5-18 mm balloon dilator was       performed to 18 mm. The dilation site was examined following endoscope       reinsertion and showed no change.      A medium-sized hiatal hernia was present.      The stomach was normal.      The examined duodenum was normal.      The 70mm balloon was dredge through the entire esophagus. Impression:           - Benign-appearing esophageal stenosis. Dilated.                       - Medium-sized hiatal hernia.                       - Normal stomach.                       - Normal examined duodenum.                       - The 79mm balloon was dredge through the entire                        esophagus.                       - No specimens collected. Recommendation:       - Return patient to hospital ward for ongoing care. Procedure Code(s):    --- Professional ---                       [phone], Esophagogastroduodenoscopy, flexible, transoral;                        with transendoscopic balloon dilation of esophagus                        (less than 30 mm diameter) Diagnosis Code(s):    --- Professional ---                       R13.10, Dysphagia, unspecified  K22.2, Esophageal obstruction CPT copyright 2016 [employer]. All rights reserved. The codes documented in this report are preliminary and upon coder review may  be revised to meet current compliance requirements. [name] MD, MD 04/18/2017 12:33:48 PM This report has been signed electronically. Number of Addenda: 0 Note Initiated On: 04/18/2017 12:20 PM      [facility]

## 2017-04-19 ENCOUNTER — Encounter: Payer: Self-pay | Admitting: Gastroenterology

## 2017-04-26 ENCOUNTER — Encounter: Payer: Self-pay | Admitting: Student in an Organized Health Care Education/Training Program

## 2017-04-26 ENCOUNTER — Ambulatory Visit
Payer: Medicare Other | Attending: Student in an Organized Health Care Education/Training Program | Admitting: Student in an Organized Health Care Education/Training Program

## 2017-04-26 VITALS — BP 99/70 | HR 79 | Temp 98.3°F | Resp 16 | Ht 66.0 in | Wt 160.0 lb

## 2017-04-26 DIAGNOSIS — K219 Gastro-esophageal reflux disease without esophagitis: Secondary | ICD-10-CM | POA: Diagnosis not present

## 2017-04-26 DIAGNOSIS — M5441 Lumbago with sciatica, right side: Secondary | ICD-10-CM | POA: Diagnosis not present

## 2017-04-26 DIAGNOSIS — G894 Chronic pain syndrome: Secondary | ICD-10-CM | POA: Diagnosis not present

## 2017-04-26 DIAGNOSIS — M25532 Pain in left wrist: Secondary | ICD-10-CM | POA: Diagnosis not present

## 2017-04-26 DIAGNOSIS — F329 Major depressive disorder, single episode, unspecified: Secondary | ICD-10-CM | POA: Insufficient documentation

## 2017-04-26 DIAGNOSIS — I2510 Atherosclerotic heart disease of native coronary artery without angina pectoris: Secondary | ICD-10-CM | POA: Insufficient documentation

## 2017-04-26 DIAGNOSIS — Z950 Presence of cardiac pacemaker: Secondary | ICD-10-CM | POA: Insufficient documentation

## 2017-04-26 DIAGNOSIS — G8929 Other chronic pain: Secondary | ICD-10-CM | POA: Diagnosis not present

## 2017-04-26 DIAGNOSIS — R079 Chest pain, unspecified: Secondary | ICD-10-CM | POA: Insufficient documentation

## 2017-04-26 DIAGNOSIS — Z9889 Other specified postprocedural states: Secondary | ICD-10-CM | POA: Insufficient documentation

## 2017-04-26 DIAGNOSIS — E782 Mixed hyperlipidemia: Secondary | ICD-10-CM | POA: Insufficient documentation

## 2017-04-26 DIAGNOSIS — G20 Parkinson's disease: Secondary | ICD-10-CM | POA: Insufficient documentation

## 2017-04-26 DIAGNOSIS — Z951 Presence of aortocoronary bypass graft: Secondary | ICD-10-CM | POA: Insufficient documentation

## 2017-04-26 DIAGNOSIS — M47816 Spondylosis without myelopathy or radiculopathy, lumbar region: Secondary | ICD-10-CM | POA: Diagnosis not present

## 2017-04-26 DIAGNOSIS — Z79899 Other long term (current) drug therapy: Secondary | ICD-10-CM | POA: Diagnosis not present

## 2017-04-26 DIAGNOSIS — R296 Repeated falls: Secondary | ICD-10-CM | POA: Insufficient documentation

## 2017-04-26 DIAGNOSIS — Z7982 Long term (current) use of aspirin: Secondary | ICD-10-CM | POA: Insufficient documentation

## 2017-04-26 DIAGNOSIS — I10 Essential (primary) hypertension: Secondary | ICD-10-CM | POA: Diagnosis not present

## 2017-04-26 DIAGNOSIS — E039 Hypothyroidism, unspecified: Secondary | ICD-10-CM | POA: Diagnosis not present

## 2017-04-26 DIAGNOSIS — Z7984 Long term (current) use of oral hypoglycemic drugs: Secondary | ICD-10-CM | POA: Diagnosis not present

## 2017-04-26 DIAGNOSIS — Z818 Family history of other mental and behavioral disorders: Secondary | ICD-10-CM | POA: Insufficient documentation

## 2017-04-26 DIAGNOSIS — G459 Transient cerebral ischemic attack, unspecified: Secondary | ICD-10-CM | POA: Insufficient documentation

## 2017-04-26 DIAGNOSIS — M4726 Other spondylosis with radiculopathy, lumbar region: Secondary | ICD-10-CM | POA: Diagnosis not present

## 2017-04-26 DIAGNOSIS — R413 Other amnesia: Secondary | ICD-10-CM | POA: Diagnosis not present

## 2017-04-26 DIAGNOSIS — K222 Esophageal obstruction: Secondary | ICD-10-CM | POA: Diagnosis not present

## 2017-04-26 DIAGNOSIS — M5442 Lumbago with sciatica, left side: Secondary | ICD-10-CM

## 2017-04-26 DIAGNOSIS — F419 Anxiety disorder, unspecified: Secondary | ICD-10-CM | POA: Diagnosis not present

## 2017-04-26 DIAGNOSIS — Z880 Allergy status to penicillin: Secondary | ICD-10-CM | POA: Insufficient documentation

## 2017-04-26 DIAGNOSIS — M5416 Radiculopathy, lumbar region: Secondary | ICD-10-CM

## 2017-04-26 DIAGNOSIS — M48061 Spinal stenosis, lumbar region without neurogenic claudication: Secondary | ICD-10-CM | POA: Insufficient documentation

## 2017-04-26 DIAGNOSIS — G8918 Other acute postprocedural pain: Secondary | ICD-10-CM | POA: Insufficient documentation

## 2017-04-26 DIAGNOSIS — M48062 Spinal stenosis, lumbar region with neurogenic claudication: Secondary | ICD-10-CM | POA: Diagnosis not present

## 2017-04-26 DIAGNOSIS — Z809 Family history of malignant neoplasm, unspecified: Secondary | ICD-10-CM | POA: Insufficient documentation

## 2017-04-26 DIAGNOSIS — Z833 Family history of diabetes mellitus: Secondary | ICD-10-CM | POA: Insufficient documentation

## 2017-04-26 DIAGNOSIS — I219 Acute myocardial infarction, unspecified: Secondary | ICD-10-CM | POA: Diagnosis not present

## 2017-04-26 DIAGNOSIS — Z79891 Long term (current) use of opiate analgesic: Secondary | ICD-10-CM | POA: Insufficient documentation

## 2017-04-26 DIAGNOSIS — Z8249 Family history of ischemic heart disease and other diseases of the circulatory system: Secondary | ICD-10-CM | POA: Insufficient documentation

## 2017-04-26 DIAGNOSIS — Z7901 Long term (current) use of anticoagulants: Secondary | ICD-10-CM | POA: Insufficient documentation

## 2017-04-26 DIAGNOSIS — E876 Hypokalemia: Secondary | ICD-10-CM | POA: Diagnosis not present

## 2017-04-26 NOTE — Progress Notes (Signed)
Safety precautions to be maintained throughout the outpatient stay will include: orient to surroundings, keep bed in low position, maintain call bell within reach at all times, provide assistance with transfer out of bed and ambulation.  

## 2017-04-26 NOTE — Progress Notes (Signed)
Patient's Name: Kristi [name]  MRN: [id number]  Referring Provider: Lorelee Market, MD  DOB: [birth year]  PCP: Lorelee Market, MD  DOS: 04/26/2017  Note by: Gillis Santa, MD  Service setting: Ambulatory outpatient  Specialty: Interventional Pain Management  Location: [facility] (AMB) Pain Management Facility    Patient type: Established   Primary Reason(s) for Visit: Encounter for post-procedure evaluation of chronic illness with mild to moderate exacerbation CC: Back Pain (lower bilateral)  HPI  Kristi [name] is a 77 y.o. year old, female patient, who comes today for a post-procedure evaluation. 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, L3-4, L4-5); Sacroiliac joint dysfunction (Bilateral); Lumbar facet syndrome (Bilateral) (R>L); Lumbar radiculopathy (Multilevel) (Bilateral); Anxiety and depression; CAD (coronary artery disease); Therapeutic opioid-induced constipation (OIC); Frequent falls; GERD (gastroesophageal reflux disease); Memory loss; Chronic pain syndrome; Long term current use of opiate analgesic; Long term prescription opiate use; Opiate use; Long term prescription benzodiazepine use; Neurogenic pain; Chronic lower extremity pain (Bilateral) (R>L); Chronic lower extremity radicular pain (Primary Source of Pain) (Bilateral) (R>L); Lower extremity weakness; Chronic low back pain (Secondary source of pain) (Bilateral) (R>L); Chronic wrist pain (Left) (secondary to fracture); Lumbar spondylosis; Fracture of clavicle; Polyneuropathy; Abnormal CT scan, lumbar spine; Lumbar foraminal stenosis (multilevel); Chest pain; Chronic knee pain (B) (R>L); Parkinson disease ([place]); Tremor; TIA (transient ischemic attack); Anxiety; Cardiac pacemaker in situ; Cerebrovascular accident [facility]); Chronic depression; Essential hypertension; Hypothyroidism; Mixed hyperlipidemia; Myocardial infarction ([place]); Hypokalemia; Lumbar facet osteoarthritis (Bilateral); Acute postoperative pain; Syncope and collapse;  Dysphagia; and Stricture and stenosis of esophagus on their problem list. Her primarily concern today is the Back Pain (lower bilateral)  Pain Assessment: Location: Lower, Left, Right Back Radiating: down the legs Onset: More than a month ago Duration: Chronic pain Quality: Cramping, Discomfort Severity: 6 /10 (self-reported pain score)  Note: Reported level is inconsistent with clinical observations. Clinically the patient looks like a 3/10 A 3/10 is viewed as "Moderate" and described as significantly interfering with activities of daily living (ADL). It becomes difficult to feed, bathe, get dressed, get on and off the toilet or to perform personal hygiene functions. Difficult to get in and out of bed or a chair without assistance. Very distracting. With effort, it can be ignored when deeply involved in activities.       When using our objective Pain Scale, levels between 6 and 10/10 are said to belong in an emergency room, as it progressively worsens from a 6/10, described as severely limiting, requiring emergency care not usually available at an outpatient pain management facility. At a 6/10 level, communication becomes difficult and requires great effort. Assistance to reach the emergency department may be required. Facial flushing and profuse sweating along with potentially dangerous increases in heart rate and blood pressure will be evident. Effect on ADL: cramping in legs is aggravating Timing: Intermittent Modifying factors: nothing currently is helping the cramping  Kristi [name] comes in today for post-procedure evaluation after the treatment done on 04/05/2017.  Further details on both, my assessment(s), as well as the proposed treatment plan, please see below.  Post-Procedure Assessment  04/05/2017 Procedure: Left Therapeutic Medial Branch Facet Radiofrequency Ablation L2, L3, L4, L5, & S1 Medial Branch Level(s) Pre-procedure pain score:  8/10 Post-procedure pain score: 0/10          Influential Factors: BMI: 25.82 kg/m Intra-procedural challenges: None observed.         Assessment challenges: None detected.  Reported side-effects: None.        Post-procedural adverse reactions or complications: None reported         Sedation: Please see nurses note. When no sedatives are used, the analgesic levels obtained are directly associated to the effectiveness of the local anesthetics. However, when sedation is provided, the level of analgesia obtained during the initial 1 hour following the intervention, is believed to be the result of a combination of factors. These factors may include, but are not limited to: 1. The effectiveness of the local anesthetics used. 2. The effects of the analgesic(s) and/or anxiolytic(s) used. 3. The degree of discomfort experienced by the patient at the time of the procedure. 4. The patients ability and reliability in recalling and recording the events. 5. The presence and influence of possible secondary gains and/or psychosocial factors. Reported result: Relief experienced during the 1st hour after the procedure: 100 % (Ultra-Short Term Relief)            Interpretative annotation: Clinically appropriate result. Analgesia during this period is likely to be Local Anesthetic and/or IV Sedative (Analgesic/Anxiolytic) related.          Effects of local anesthetic: The analgesic effects attained during this period are directly associated to the localized infiltration of local anesthetics and therefore cary significant diagnostic value as to the etiological location, or anatomical origin, of the pain. Expected duration of relief is directly dependent on the pharmacodynamics of the local anesthetic used. Long-acting (4-6 hours) anesthetics used.  Reported result: Relief during the next 4 to 6 hour after the procedure: 100 % (Short-Term Relief)            Interpretative annotation: Clinically appropriate result. Analgesia during this period is  likely to be Local Anesthetic-related.          Long-term benefit: Defined as the period of time past the expected duration of local anesthetics (1 hour for short-acting and 4-6 hours for long-acting). With the possible exception of prolonged sympathetic blockade from the local anesthetics, benefits during this period are typically attributed to, or associated with, other factors such as analgesic sensory neuropraxia, antiinflammatory effects, or beneficial biochemical changes provided by agents other than the local anesthetics.  Reported result: Extended relief following procedure: 100 %(patient is experiencing cramps in both legs in the hamstring area. ) (Long-Term Relief)            Interpretative annotation: Clinically appropriate result. Good relief. Therapeutic success. Inflammation plays a part in the etiology to the pain.          Current benefits: Defined as reported results that persistent at this point in time.   Analgesia: 75-100 % Kristi [name] reports that both, extremity and the axial pain improved with the treatment. Function: Somewhat improved ROM: Somewhat improved Interpretative annotation: Ongoing benefit. Therapeutic benefit observed. Effective therapeutic approach.          Interpretation: Results would suggest a successful therapeutic intervention.                  Plan:  Please see "Plan of Care" for details.        Laboratory Chemistry  Inflammation Markers (CRP: Acute Phase) (ESR: Chronic Phase) Lab Results  Component Value Date   CRP <0.8 04/12/2016   ESRSEDRATE 6 04/12/2016                 Rheumatology Markers No results found for: RF, ANA, LABURIC, URICUR, LYMEIGGIGMAB, LYMEABIGMQN  Renal Function Markers Lab Results  Component Value Date   BUN 8 04/18/2017   CREATININE 0.72 04/18/2017   GFRAA >60 04/18/2017   GFRNONAA >60 04/18/2017                 Hepatic Function Markers Lab Results  Component Value Date   AST 49 (H) 04/10/2017   ALT  6 (L) 04/10/2017   ALBUMIN 3.4 (L) 04/10/2017   ALKPHOS 151 (H) 04/10/2017                 Electrolytes Lab Results  Component Value Date   NA 138 04/18/2017   K 3.6 04/18/2017   CL 107 04/18/2017   CALCIUM 8.5 (L) 04/18/2017   MG 1.9 12/25/2016                 Neuropathy Markers Lab Results  Component Value Date   VITAMINB12 1,406 (H) 04/12/2016   HGBA1C 12.3 (H) 04/12/2017                 Bone Pathology Markers Lab Results  Component Value Date   25OHVITD1 49 04/12/2016   25OHVITD2 <1.0 04/12/2016   25OHVITD3 49 04/12/2016                 Coagulation Parameters Lab Results  Component Value Date   INR 0.86 12/04/2016   LABPROT 11.6 12/04/2016   APTT 25 12/04/2016   PLT 139 (L) 04/16/2017                 Cardiovascular Markers Lab Results  Component Value Date   BNP 111.0 (H) 08/28/2016   TROPONINI 0.05 (HH) 04/11/2017   HGB 10.9 (L) 04/16/2017   HCT 32.6 (L) 04/16/2017                 CA Markers No results found for: CEA, CA125, LABCA2               Note: Lab results reviewed.  Recent Diagnostic Imaging Results  DG Esophagus CLINICAL DATA:  The patient reports increasing shortness of breath at rest and with exertion over the past 2 months with peripheral edema. The patient reports dysphagia for 1 month. History of esophageal dilation 10 years ago.  EXAM: ESOPHOGRAM / BARIUM SWALLOW / BARIUM TABLET STUDY  TECHNIQUE: Combined double contrast and single contrast examination performed using thick and thin barium liquid. The patient was observed with fluoroscopy swallowing a 13 mm barium sulphate tablet. Effervescent crystals were not administered due to the fact the study had to be performed with the patient semi supine and supine.  FLUOROSCOPY TIME:  Fluoroscopy Time:  1 minute, 36 seconds  Radiation Exposure Index (if provided by the fluoroscopic device): 916 micro Gy per meters square  Number of Acquired Spot Images: 0  COMPARISON:   Abdominal and pelvic CT scan of 11 April 2017  FINDINGS: The patient ingested thick and thin barium without difficulty. The cervical esophagus was normal in a survey fashion. No laryngeal penetration of the barium was observed. The thoracic esophagus distended well down to its distal third. There there was a small non reducible hiatal hernia. The distal esophagus relaxed reasonably well and allowed passage of liquid barium into the hiatal hernia. However, the channel through the esophageal hiatus was narrowed and the caliber of the channel of communication between the hiatal hernia and the intra-abdominal portion of the stomach was persistently narrowed. The patient ingested the barium tablet which hung in the distal esophagus just  proximal to the hiatal hernia and had to be allowed to dissolve. No evidence of ulceration or mass was observed.  IMPRESSION: Moderate-sized non reducible hiatal hernia. The portion of the hiatal hernia-stomach within the esophageal hiatus is narrowed. No definite volvulus is observed.  Mild narrowing of the distal esophagus just proximal to the hiatal hernia. The barium tablet hung in this region and was allowed to dissolve.  Direct visualization is recommended. Repeat dilation may be indicated.  Electronically Signed   By: David  Martinique M.D.   On: 04/13/2017 07:58  Complexity Note: Imaging results reviewed. Results shared with Kristi [name], using Layman's terms.                         Meds   Current Outpatient Medications:  .  aspirin EC 81 MG tablet, Take 81 mg by mouth daily., Disp: , Rfl:  .  carbidopa-levodopa (SINEMET IR) 25-100 MG tablet, Take by mouth., Disp: , Rfl:  .  carvedilol (COREG) 3.125 MG tablet, Take 1 tablet (3.125 mg total) by mouth 2 (two) times daily with a meal., Disp: 60 tablet, Rfl: 2 .  cholecalciferol (VITAMIN D) 1000 UNITS tablet, Take 1,000 Units by mouth daily. Reported on 07/08/2015, Disp: , Rfl:  .  clonazePAM  (KLONOPIN) 0.5 MG tablet, Take 0.5 mg by mouth 2 (two) times daily., Disp: , Rfl:  .  gabapentin (NEURONTIN) 300 MG capsule, Take 30 mg by mouth at bedtime., Disp: , Rfl:  .  glimepiride (AMARYL) 1 MG tablet, Take 1 tablet (1 mg total) by mouth daily with breakfast., Disp: 30 tablet, Rfl: 2 .  levothyroxine (SYNTHROID, LEVOTHROID) 75 MCG tablet, , Disp: , Rfl:  .  Linaclotide (LINZESS) 145 MCG CAPS capsule, Take 145 mcg by mouth daily., Disp: , Rfl:  .  loratadine (CLARITIN) 10 MG tablet, Take 10 mg by mouth daily., Disp: , Rfl:  .  metFORMIN (GLUCOPHAGE) 500 MG tablet, Take 1 tablet (500 mg total) by mouth 2 (two) times daily with a meal., Disp: 60 tablet, Rfl: 2 .  Multiple Vitamins-Minerals (ALIVE WOMENS ENERGY PO), Take 1 tablet by mouth daily., Disp: , Rfl:  .  nitroGLYCERIN (NITROSTAT) 0.4 MG SL tablet, Place 0.4 mg under the tongue every 5 (five) minutes as needed for chest pain. , Disp: , Rfl:  .  omeprazole (PRILOSEC) 20 MG capsule, Take 20 mg by mouth daily., Disp: , Rfl:  .  rosuvastatin (CRESTOR) 20 MG tablet, Take 20 mg by mouth daily., Disp: , Rfl:  .  sertraline (ZOLOFT) 100 MG tablet, Take 50-100 mg by mouth 2 (two) times daily. Take 100 mg in the am and 50 mg at bedtime., Disp: , Rfl:  .  thiothixene (NAVANE) 5 MG capsule, Take 5 mg by mouth 2 (two) times daily., Disp: , Rfl: 2 .  traMADol-acetaminophen (ULTRACET) 37.5-325 MG tablet, Take 1 tablet by mouth every 6 (six) hours as needed for pain., Disp: , Rfl:  .  vitamin B-12 (CYANOCOBALAMIN) 500 MCG tablet, Take 500 mcg by mouth daily. , Disp: , Rfl:  .  clopidogrel (PLAVIX) 75 MG tablet, , Disp: , Rfl:  .  docusate sodium (COLACE) 100 MG capsule, Take 100 mg by mouth 2 (two) times daily., Disp: , Rfl:  .  fluticasone (FLONASE) 50 MCG/ACT nasal spray, Place 2 sprays into both nostrils daily., Disp: , Rfl:  .  olopatadine (PATANOL) 0.1 % ophthalmic solution, 1 drop 2 (two) times daily., Disp: , Rfl:  .  oxyCODONE-acetaminophen  (PERCOCET/ROXICET) 5-325 MG tablet, Take 1 tablet by mouth every 6 (six) hours as needed for severe pain., Disp: , Rfl:  .  traZODone (DESYREL) 50 MG tablet, Take 50 mg by mouth at bedtime as needed for sleep. , Disp: , Rfl:   ROS  Constitutional: Denies any fever or chills Gastrointestinal: No reported hemesis, hematochezia, vomiting, or acute GI distress Musculoskeletal: Denies any acute onset joint swelling, redness, loss of ROM, or weakness Neurological: No reported episodes of acute onset apraxia, aphasia, dysarthria, agnosia, amnesia, paralysis, loss of coordination, or loss of consciousness  Allergies  Kristi [name] is allergic to penicillins and penicillin g.  PFSH  Drug: Kristi [name]  reports that she does not use drugs. Alcohol:  reports that she does not drink alcohol. Tobacco:  reports that  has never smoked. she has never used smokeless tobacco. Medical:  has a past medical history of Anxiety, Broken arm, Broken arm, CAD (coronary artery disease), Depression, Hyperlipidemia, Hypertension, Hypothyroid, Neck pain (06/04/2014), Parkinson's disease ([place]), and Spinal stenosis. Surgical: Kristi [name]  has a past surgical history that includes Abdominal hysterectomy; Coronary stent placement; Coronary artery bypass graft; s/p pacer insertion; and Esophagogastroduodenoscopy (egd) with propofol (N/A, 04/18/2017). Family: family history includes Cancer in her mother; Depression in her mother; Diabetes in her mother; Heart disease in her father; Hypertension in her mother.  Constitutional Exam  General appearance: Well nourished, well developed, and well hydrated. In no apparent acute distress Vitals:   04/26/17 1335  BP: 99/70  Pulse: 79  Resp: 16  Temp: 98.3 F (36.8 C)  TempSrc: Oral  SpO2: 100%  Weight: 160 lb (72.6 kg)  Height: '5\' 6"'  (1.676 m)   BMI Assessment: Estimated body mass index is 25.82 kg/m as calculated from the following:   Height as of this encounter: '5\' 6"'  (1.676  m).   Weight as of this encounter: 160 lb (72.6 kg).  BMI interpretation table: BMI level Category Range association with higher incidence of chronic pain  <18 kg/m2 Underweight   18.5-24.9 kg/m2 Ideal body weight   25-29.9 kg/m2 Overweight Increased incidence by 20%  30-34.9 kg/m2 Obese (Class I) Increased incidence by 68%  35-39.9 kg/m2 Severe obesity (Class II) Increased incidence by 136%  >40 kg/m2 Extreme obesity (Class III) Increased incidence by 254%   BMI Readings from Last 4 Encounters:  04/26/17 25.82 kg/m  04/18/17 29.11 kg/m  03/06/17 22.71 kg/m  01/17/17 24.21 kg/m   Wt Readings from Last 4 Encounters:  04/26/17 160 lb (72.6 kg)  04/18/17 185 lb 13.6 oz (84.3 kg)  03/06/17 145 lb (65.8 kg)  01/17/17 150 lb (68 kg)  Psych/Mental status: Alert, oriented x 3 (person, place, & time)       Eyes: PERLA Respiratory: No evidence of acute respiratory distress  Cervical Spine Area Exam  Skin & Axial Inspection: No masses, redness, edema, swelling, or associated skin lesions Alignment: Symmetrical Functional ROM: Unrestricted ROM      Stability: No instability detected Muscle Tone/Strength: Functionally intact. No obvious neuro-muscular anomalies detected. Sensory (Neurological): Unimpaired Palpation: No palpable anomalies              Upper Extremity (UE) Exam    Side: Right upper extremity  Side: Left upper extremity  Skin & Extremity Inspection: Skin color, temperature, and hair growth are WNL. No peripheral edema or cyanosis. No masses, redness, swelling, asymmetry, or associated skin lesions. No contractures.  Skin & Extremity Inspection: Skin color, temperature, and hair growth are WNL.  No peripheral edema or cyanosis. No masses, redness, swelling, asymmetry, or associated skin lesions. No contractures.  Functional ROM: Unrestricted ROM          Functional ROM: Unrestricted ROM          Muscle Tone/Strength: Functionally intact. No obvious neuro-muscular anomalies  detected.  Muscle Tone/Strength: Functionally intact. No obvious neuro-muscular anomalies detected.  Sensory (Neurological): Unimpaired          Sensory (Neurological): Unimpaired          Palpation: No palpable anomalies              Palpation: No palpable anomalies              Specialized Test(s): Deferred         Specialized Test(s): Deferred          Thoracic Spine Area Exam  Skin & Axial Inspection: No masses, redness, or swelling Alignment: Symmetrical Functional ROM: Unrestricted ROM Stability: No instability detected Muscle Tone/Strength: Functionally intact. No obvious neuro-muscular anomalies detected. Sensory (Neurological): Unimpaired Muscle strength & Tone: No palpable anomalies  Lumbar Spine Area Exam  Skin & Axial Inspection: No masses, redness, or swelling Alignment: Symmetrical Functional ROM: Unrestricted ROM      Stability: No instability detected Muscle Tone/Strength: Functionally intact. No obvious neuro-muscular anomalies detected. Sensory (Neurological): Improved Palpation: No palpable anomalies       Provocative Tests: Lumbar Hyperextension and rotation test: Improved after treatment       Lumbar Lateral bending test: Improved after treatment       Patrick's Maneuver: evaluation deferred today                    Gait & Posture Assessment  Ambulation: Unassisted Gait: Relatively normal for age and body habitus Posture: WNL   Lower Extremity Exam    Side: Right lower extremity  Side: Left lower extremity  Skin & Extremity Inspection: Skin color, temperature, and hair growth are WNL. No peripheral edema or cyanosis. No masses, redness, swelling, asymmetry, or associated skin lesions. No contractures.  Skin & Extremity Inspection: Skin color, temperature, and hair growth are WNL. No peripheral edema or cyanosis. No masses, redness, swelling, asymmetry, or associated skin lesions. No contractures.  Functional ROM: Unrestricted ROM          Functional ROM:  Unrestricted ROM          Muscle Tone/Strength: Functionally intact. No obvious neuro-muscular anomalies detected.  Muscle Tone/Strength: Functionally intact. No obvious neuro-muscular anomalies detected.  Sensory (Neurological): Unimpaired  Sensory (Neurological): Unimpaired  Palpation: No palpable anomalies  Palpation: No palpable anomalies   Assessment  Primary Diagnosis & Pertinent Problem List: The primary encounter diagnosis was Lumbar spondylosis. 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  Status Diagnosis  Controlled Controlled Controlled 1. Lumbar spondylosis   2. Lumbar facet osteoarthritis   3. Lumbar facet syndrome (Bilateral) (R>L)   4. Chronic low back pain (Secondary source of pain) (Bilateral) (R>L)   5. Lumbar radiculopathy (Multilevel) (Bilateral)   6. 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 Chronic pain syndrome      77 year old female with a history of axial low back pain that radiates into her left buttock and thigh region secondary to lumbar spondylosis status post  Left Therapeutic Medial Branch Facet Radiofrequency Ablation at L2, L3, L4, L5, & S1 Medial Branch Levels presents for postprocedural evaluation.  Patient endorses significant axial low back pain relief and improvement in functional status.  She states that she more comfortable performing activities of daily living and less pain.  She endorses greater than 75% pain relief in her axial low back pain on the left after her radiofrequency ablation.  Of note, she was recently diagnosed with type 2 diabetes after having a hospital admission where her blood sugars were in the 700s.  Patient is endorsing intermittent cramps in  her hamstrings.  We discussed performing a lumbar epidural steroid injection but given her uncontrolled blood sugars and recent diagnosis of type 2 diabetes I would like to hold off on this.  I will have the patient follow-up with me in approximately 2 months to see how she is doing and if she is still continuing to have cramps her hamstrings, we can further discuss lumbar epidural steroid injection at that time especially if her blood sugars are better controlled.  Provider-requested follow-up: Return in about 8 weeks (around 06/21/2017). Time Note: Greater than 50% of the 15 minute(s) of face-to-face time spent with Kristi [name], was spent in counseling/coordination of care regarding: Kristi [name]'s primary cause of pain, the treatment plan, treatment alternatives, the risks and possible complications of proposed treatment, the results, interpretation and significance of  her recent diagnostic interventional treatment(s) and realistic expectations. Future Appointments  Date Time Provider [place]  06/20/2017 12:00 PM Gillis Santa, MD [facility] None    Primary Care Physician: Lorelee Market, MD Location: [facility] Note by: Gillis Santa, M.D Date: 04/26/2017; Time: 3:37 PM  There are no Patient Instructions on file for this visit.

## 2017-04-27 ENCOUNTER — Emergency Department: Payer: Medicare Other

## 2017-04-27 ENCOUNTER — Other Ambulatory Visit: Payer: Self-pay

## 2017-04-27 ENCOUNTER — Encounter: Payer: Self-pay | Admitting: Emergency Medicine

## 2017-04-27 ENCOUNTER — Emergency Department
Admission: EM | Admit: 2017-04-27 | Discharge: 2017-04-27 | Disposition: A | Discharge status: Home / Self Care | Payer: Medicare Other | Attending: Emergency Medicine | Admitting: Emergency Medicine

## 2017-04-27 DIAGNOSIS — E039 Hypothyroidism, unspecified: Secondary | ICD-10-CM | POA: Diagnosis not present

## 2017-04-27 DIAGNOSIS — Z79899 Other long term (current) drug therapy: Secondary | ICD-10-CM | POA: Diagnosis not present

## 2017-04-27 DIAGNOSIS — G20 Parkinson's disease: Secondary | ICD-10-CM | POA: Diagnosis not present

## 2017-04-27 DIAGNOSIS — Z950 Presence of cardiac pacemaker: Secondary | ICD-10-CM | POA: Insufficient documentation

## 2017-04-27 DIAGNOSIS — J40 Bronchitis, not specified as acute or chronic: Secondary | ICD-10-CM | POA: Diagnosis not present

## 2017-04-27 DIAGNOSIS — Z7982 Long term (current) use of aspirin: Secondary | ICD-10-CM | POA: Diagnosis not present

## 2017-04-27 DIAGNOSIS — R110 Nausea: Secondary | ICD-10-CM | POA: Insufficient documentation

## 2017-04-27 DIAGNOSIS — I2510 Atherosclerotic heart disease of native coronary artery without angina pectoris: Secondary | ICD-10-CM | POA: Diagnosis not present

## 2017-04-27 DIAGNOSIS — Z7902 Long term (current) use of antithrombotics/antiplatelets: Secondary | ICD-10-CM | POA: Diagnosis not present

## 2017-04-27 DIAGNOSIS — R531 Weakness: Secondary | ICD-10-CM | POA: Diagnosis present

## 2017-04-27 DIAGNOSIS — Z8673 Personal history of transient ischemic attack (TIA), and cerebral infarction without residual deficits: Secondary | ICD-10-CM | POA: Diagnosis not present

## 2017-04-27 DIAGNOSIS — B349 Viral infection, unspecified: Secondary | ICD-10-CM | POA: Diagnosis not present

## 2017-04-27 DIAGNOSIS — Z7984 Long term (current) use of oral hypoglycemic drugs: Secondary | ICD-10-CM | POA: Diagnosis not present

## 2017-04-27 DIAGNOSIS — I10 Essential (primary) hypertension: Secondary | ICD-10-CM | POA: Insufficient documentation

## 2017-04-27 DIAGNOSIS — E119 Type 2 diabetes mellitus without complications: Secondary | ICD-10-CM | POA: Insufficient documentation

## 2017-04-27 HISTORY — DX: Type 2 diabetes mellitus without complications: E11.9

## 2017-04-27 LAB — URINALYSIS, COMPLETE (UACMP) WITH MICROSCOPIC
Bacteria, UA: NONE SEEN
Bilirubin Urine: NEGATIVE
Glucose, UA: NEGATIVE mg/dL
Hgb urine dipstick: NEGATIVE
Ketones, ur: NEGATIVE mg/dL
Leukocytes, UA: NEGATIVE
Nitrite: NEGATIVE
Protein, ur: NEGATIVE mg/dL
RBC / HPF: NONE SEEN RBC/hpf (ref 0–5)
Specific Gravity, Urine: 1.006 (ref 1.005–1.030)
WBC, UA: NONE SEEN WBC/hpf (ref 0–5)
pH: 7 (ref 5.0–8.0)

## 2017-04-27 LAB — TROPONIN I: Troponin I: <0.03 ng/mL (ref <0.03)

## 2017-04-27 LAB — BASIC METABOLIC PANEL
Anion gap: 11 (ref 5–15)
BUN: 23 mg/dL — ABNORMAL HIGH (ref 6–20)
CO2: 28 mmol/L (ref 22–32)
Calcium: 9.3 mg/dL (ref 8.9–10.3)
Chloride: 101 mmol/L (ref 101–111)
Creatinine, Ser: 0.84 mg/dL (ref 0.44–1.00)
GFR calc Af Amer: >60 mL/min (ref >60)
GFR calc non Af Amer: >60 mL/min (ref >60)
Glucose, Bld: 129 mg/dL — ABNORMAL HIGH (ref 65–99)
Potassium: 3.8 mmol/L (ref 3.5–5.1)
Sodium: 140 mmol/L (ref 135–145)

## 2017-04-27 LAB — CBC
HCT: 33.6 % — ABNORMAL LOW (ref 35.0–47.0)
Hemoglobin: 11.2 g/dL — ABNORMAL LOW (ref 12.0–16.0)
MCH: 31.4 pg (ref 26.0–34.0)
MCHC: 33.3 g/dL (ref 32.0–36.0)
MCV: 94.4 fL (ref 80.0–100.0)
Platelets: 234 10*3/uL (ref 150–440)
RBC: 3.56 MIL/uL — ABNORMAL LOW (ref 3.80–5.20)
RDW: 15 % — ABNORMAL HIGH (ref 11.5–14.5)
WBC: 4.5 10*3/uL (ref 3.6–11.0)

## 2017-04-27 LAB — TSH: TSH: 15.241 u[IU]/mL — ABNORMAL HIGH (ref 0.350–4.500)

## 2017-04-27 MED ORDER — AZITHROMYCIN 500 MG PO TABS
500.0000 mg | ORAL_TABLET | Freq: Once | ORAL | Status: AC
Start: 2017-04-27 — End: 2017-04-27
  Administered 2017-04-27: 500 mg via ORAL
  Filled 2017-04-27: qty 1

## 2017-04-27 MED ORDER — AZITHROMYCIN 250 MG PO TABS: ORAL_TABLET | ORAL | 0 refills | Status: AC

## 2017-04-27 MED ORDER — SODIUM CHLORIDE 0.9 % IV BOLUS (SEPSIS)
1000.0000 mL | Freq: Once | INTRAVENOUS | Status: AC
  Administered 2017-04-27: 1000 mL via INTRAVENOUS

## 2017-04-27 MED ORDER — PREDNISONE 20 MG PO TABS
40.0000 mg | ORAL_TABLET | Freq: Once | ORAL | Status: AC
  Administered 2017-04-27: 40 mg via ORAL
  Filled 2017-04-27: qty 2

## 2017-04-27 MED ORDER — ONDANSETRON HCL 4 MG/2ML IJ SOLN
4.0000 mg | Freq: Once | INTRAMUSCULAR | Status: AC
  Administered 2017-04-27: 4 mg via INTRAVENOUS
  Filled 2017-04-27: qty 2

## 2017-04-27 MED ORDER — PREDNISONE 20 MG PO TABS: 40.0000 mg | ORAL_TABLET | Freq: Every day | ORAL | 0 refills | Status: DC

## 2017-04-27 MED ORDER — ALBUTEROL SULFATE HFA 108 (90 BASE) MCG/ACT IN AERS: 2.0000 | INHALATION_SPRAY | Freq: Four times a day (QID) | RESPIRATORY_TRACT | 0 refills | Status: DC | PRN

## 2017-04-27 MED ORDER — ALBUTEROL SULFATE (2.5 MG/3ML) 0.083% IN NEBU
2.5000 mg | INHALATION_SOLUTION | Freq: Once | RESPIRATORY_TRACT | Status: AC
  Administered 2017-04-27: 2.5 mg via RESPIRATORY_TRACT
  Filled 2017-04-27: qty 3

## 2017-04-27 NOTE — ED Notes (Signed)
MD states pt may leave without completing bolus since pt states she is feeling much better and ready to leave

## 2017-04-27 NOTE — ED Notes (Signed)
Informed RN that patient has been roomed and is ready for evaluation.  Patient in NAD at this time and call bell placed within reach.   

## 2017-04-27 NOTE — ED Provider Notes (Signed)
[facility] Emergency Department Provider Note  ____________________________________________   First MD Initiated Contact with Patient 04/27/17 1635     (approximate)  I have reviewed the triage vital signs and the nursing notes.   HISTORY  Chief Complaint Weakness and Cough   HPI Kristi [name] is a 77 y.o. female with a history of CAD, depression and diabetes who is presenting to the emergency department today for diffuse weakness, nausea, aching eyes and cough with sputum production over the past week.  She says that she is also sweating at night.  Denies any burning with urination.  Denies any diarrhea.  Denies any vomiting.  Denies any known sick contacts.  Says that she feels lightheaded when she gets up.  Past Medical History:  Diagnosis Date  . Anxiety   . Broken arm    left FA 3/17  . Broken arm    left  . CAD (coronary artery disease)    s/p MI  . Depression   . Diabetes mellitus without complication ([place])   . Hyperlipidemia   . Hypertension   . Hypothyroid   . Neck pain 06/04/2014  . Parkinson's disease ([place])   . Spinal stenosis     Patient Active Problem List   Diagnosis Date Noted  . Stricture and stenosis of esophagus   . Dysphagia   . Syncope and collapse 04/11/2017  . Lumbar facet osteoarthritis (Bilateral) 01/17/2017  . Acute postoperative pain 01/17/2017  . Hypokalemia 12/24/2016  . Anxiety 12/07/2016  . 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 Cerebrovascular accident ([place]) 12/07/2016  . Chronic depression 12/07/2016  . Essential hypertension 12/07/2016  . Hypothyroidism 12/07/2016  . Mixed hyperlipidemia 12/07/2016  . Myocardial infarction ([place]) 12/07/2016  . TIA (transient ischemic attack) 11/17/2016  . Parkinson disease ([place]) 10/27/2016  . Tremor 10/27/2016  . Chronic knee pain (B) (R>L) 10/19/2016  . Chest pain 09/29/2016  . Abnormal CT scan, lumbar spine 09/08/2016  . Lumbar foraminal stenosis (multilevel)  09/08/2016  . Fracture of clavicle 08/03/2016  . Polyneuropathy 05/17/2016  . Neurogenic pain 04/12/2016  . Chronic lower extremity pain (Bilateral) (R>L) 04/12/2016  . Chronic lower extremity radicular pain (Primary Source of Pain) (Bilateral) (R>L) 04/12/2016  . Lower extremity weakness 04/12/2016  . Chronic low back pain (Secondary source of pain) (Bilateral) (R>L) 04/12/2016  . Chronic wrist pain (Left) (secondary to fracture) 04/12/2016  . Lumbar spondylosis 04/12/2016  . Chronic pain syndrome 04/11/2016  . Long term current use of opiate analgesic 04/11/2016  . Long term prescription opiate use 04/11/2016  . Opiate use 04/11/2016  . Long term prescription benzodiazepine use 04/11/2016  . Lumbar radiculopathy (Multilevel) (Bilateral) 05/07/2015  . Lumbar facet syndrome (Bilateral) (R>L) 08/21/2014  . 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 Sacroiliac joint dysfunction (Bilateral) 08/13/2014  . Frequent falls 06/04/2014  . Memory loss 06/04/2014  . Anxiety and depression 08/22/2013  . CAD (coronary artery disease) 08/22/2013  . Therapeutic opioid-induced constipation (OIC) 08/22/2013  . GERD (gastroesophageal reflux disease) 08/22/2013    Past Surgical History:  Procedure Laterality Date  . ABDOMINAL HYSTERECTOMY    . CORONARY ARTERY BYPASS GRAFT    . CORONARY STENT PLACEMENT    . ESOPHAGOGASTRODUODENOSCOPY (EGD) WITH PROPOFOL N/A 04/18/2017   Procedure: ESOPHAGOGASTRODUODENOSCOPY (EGD) WITH PROPOFOL;  Surgeon: [name], MD;  Location: [facility] ENDOSCOPY;  Service: Endoscopy;  Laterality: N/A;  . s/p pacer insertion      Prior to Admission medications  Medication Sig Start Date End Date Taking? Authorizing Provider  aspirin EC 81 MG tablet Take 81 mg by mouth daily.    [provider]  carbidopa-levodopa (SINEMET IR) 25-100 MG tablet Take by mouth. 03/08/17   [provider]  carvedilol (COREG) 3.125 MG tablet Take 1 tablet  (3.125 mg total) by mouth 2 (two) times daily with a meal. 04/18/17   Gladstone Lighter, MD  cholecalciferol (VITAMIN D) 1000 UNITS tablet Take 1,000 Units by mouth daily. Reported on 07/08/2015    [provider]  clonazePAM (KLONOPIN) 0.5 MG tablet Take 0.5 mg by mouth 2 (two) times daily.    [provider]  clopidogrel (PLAVIX) 75 MG tablet  11/17/16   [provider]  docusate sodium (COLACE) 100 MG capsule Take 100 mg by mouth 2 (two) times daily.    [provider]  fluticasone (FLONASE) 50 MCG/ACT nasal spray Place 2 sprays into both nostrils daily.    [provider]  gabapentin (NEURONTIN) 300 MG capsule Take 30 mg by mouth at bedtime. 04/24/17   [provider]  glimepiride (AMARYL) 1 MG tablet Take 1 tablet (1 mg total) by mouth daily with breakfast. 04/19/17   Gladstone Lighter, MD  levothyroxine (SYNTHROID, LEVOTHROID) 75 MCG tablet  03/23/17   [provider]  Linaclotide (LINZESS) 145 MCG CAPS capsule Take 145 mcg by mouth daily.    [provider]  loratadine (CLARITIN) 10 MG tablet Take 10 mg by mouth daily.    [provider]  metFORMIN (GLUCOPHAGE) 500 MG tablet Take 1 tablet (500 mg total) by mouth 2 (two) times daily with a meal. 04/18/17   Gladstone Lighter, MD  Multiple Vitamins-Minerals (ALIVE WOMENS ENERGY PO) Take 1 tablet by mouth daily.    [provider]  nitroGLYCERIN (NITROSTAT) 0.4 MG SL tablet Place 0.4 mg under the tongue every 5 (five) minutes as needed for chest pain.  10/03/16   [provider]  olopatadine (PATANOL) 0.1 % ophthalmic solution 1 drop 2 (two) times daily.    [provider]  omeprazole (PRILOSEC) 20 MG capsule Take 20 mg by mouth daily.    [provider]  oxyCODONE-acetaminophen (PERCOCET/ROXICET) 5-325 MG tablet Take 1 tablet by mouth every 6 (six) hours as needed for severe pain.    [provider]  rosuvastatin (CRESTOR) 20 MG  tablet Take 20 mg by mouth daily.    [provider]  sertraline (ZOLOFT) 100 MG tablet Take 50-100 mg by mouth 2 (two) times daily. Take 100 mg in the am and 50 mg at bedtime.    [provider]  thiothixene (NAVANE) 5 MG capsule Take 5 mg by mouth 2 (two) times daily. 08/09/16   [provider]  traMADol-acetaminophen (ULTRACET) 37.5-325 MG tablet Take 1 tablet by mouth every 6 (six) hours as needed for pain.    [provider]  traZODone (DESYREL) 50 MG tablet Take 50 mg by mouth at bedtime as needed for sleep.     [provider]  vitamin B-12 (CYANOCOBALAMIN) 500 MCG tablet Take 500 mcg by mouth daily.     [provider]    Allergies Penicillins and Penicillin g  Family History  Problem Relation Age of Onset  . Cancer Mother   . Depression Mother   . Diabetes Mother   . Hypertension Mother   . Heart disease Father     Social History Social History   Tobacco Use  . Smoking status: Never  Smoker  . Smokeless tobacco: Never Used  Substance Use Topics  . Alcohol use: No  . Drug use: No    Review of Systems  Constitutional: Night sweats Eyes: Aching to the eyes.  However, denies any visual changes.  ENT: No sore throat. Cardiovascular: As above. Respiratory: Denies shortness of breath. Gastrointestinal: No abdominal pain.   no vomiting.  No diarrhea.  No constipation. Genitourinary: Negative for dysuria. Musculoskeletal: Negative for back pain. Skin: Negative for rash. Neurological: Negative for headaches, focal weakness or numbness.   ____________________________________________   PHYSICAL EXAM:  VITAL SIGNS: ED Triage Vitals  Enc Vitals Group     BP 04/27/17 1500 101/75     Pulse Rate 04/27/17 1500 61     Resp 04/27/17 1500 16     Temp 04/27/17 1500 98.8 F (37.1 C)     Temp Source 04/27/17 1500 Oral     SpO2 04/27/17 1500 97 %     Weight 04/27/17 1501 160 lb (72.6 kg)     Height 04/27/17 1501 5\' 7"   (1.702 m)     Head Circumference --      Peak Flow --      Pain Score 04/27/17 1500 0     Pain Loc --      Pain Edu? --      Excl. in [place]? --     Constitutional: Alert and oriented. Well appearing and in no acute distress. Eyes: Conjunctivae are normal.  PERRL.   Head: Atraumatic. Nose: No congestion/rhinnorhea. Mouth/Throat: Mucous membranes are moist.  No lesions to the mucosa. Neck: No stridor.   Cardiovascular: Normal rate, regular rhythm. Grossly normal heart sounds.  Respiratory: Normal respiratory effort.  Mildly prolonged expiratory phase with wheezing throughout.  No respiratory distress.  Speaks in full sentences. Gastrointestinal: Soft and nontender. No distention.  Musculoskeletal: No lower extremity tenderness nor edema.  No joint effusions. Neurologic:  Normal speech and language. No gross focal neurologic deficits are appreciated. Skin:  Skin is warm, dry and intact. No rash noted. Psychiatric: Mood and affect are normal. Speech and behavior are normal.  ____________________________________________   LABS (all labs ordered are listed, but only abnormal results are displayed)  Labs Reviewed  BASIC METABOLIC PANEL - Abnormal; Notable for the following components:      Result Value   Glucose, Bld 129 (*)    BUN 23 (*)    All other components within normal limits  CBC - Abnormal; Notable for the following components:   RBC 3.56 (*)    Hemoglobin 11.2 (*)    HCT 33.6 (*)    RDW 15.0 (*)    All other components within normal limits  URINALYSIS, COMPLETE (UACMP) WITH MICROSCOPIC  TROPONIN I  CBG MONITORING, ED   ____________________________________________  EKG  ED ECG REPORT I, Doran Stabler, the attending physician, personally viewed and interpreted this ECG.   Date: 04/27/2017  EKG Time: 1508  Rate: 61  Rhythm: Ventricular pacing  Axis: Consistent with ventricular pacing  Intervals:Wide-complex consistent with ventricular pacing  ST&T Change: T  wave inversions in 1 as well as aVL.  Otherwise, there are no abnormal ST elevations or depressions.  ____________________________________________  RADIOLOGY  No acute finding ____________________________________________   PROCEDURES  Procedure(s) performed:   Procedures  Critical Care performed:   ____________________________________________   INITIAL IMPRESSION / ASSESSMENT AND PLAN / ED COURSE  Pertinent labs & imaging results that were available during my care of the patient were reviewed by me  and considered in my medical decision making (see chart for details).  Differential includes, but is not limited to, viral syndrome, bronchitis including COPD exacerbation, pneumonia, reactive airway disease including asthma, CHF including exacerbation with or without pulmonary/interstitial edema, pneumothorax, ACS, thoracic trauma, and pulmonary embolism. As part of my medical decision making, I reviewed the following data within the electronic MEDICAL RECORD NUMBER Notes from prior ED visits   ----------------------------------------- 7:50 PM on 04/27/2017 -----------------------------------------  Patient at this time feels improved.  I re-auscultated her lungs and they are clear throughout.  Only a slightly prolonged expiratory phase.  No respiratory distress and the patient is speaking full sentences.  Patient to be discharged home with azithromycin as well as steroids and inhaler.  Likely bronchitis and viral syndrome.     ____________________________________________   FINAL CLINICAL IMPRESSION(S) / ED DIAGNOSES  Bronchitis Viral syndrome    NEW MEDICATIONS STARTED DURING THIS VISIT:  New Prescriptions   No medications on file     Note:  This document was prepared using Dragon voice recognition software and may include unintentional dictation errors.     Orbie Pyo, MD 04/27/17 [phone]

## 2017-04-27 NOTE — ED Triage Notes (Signed)
Pt in via POV with complaints of increased generalized weakness, cough x 4 days.  Pt with recent hospitalization due to new onset diabetes.  Vitals WDL, NAD noted at this time.

## 2017-05-03 ENCOUNTER — Other Ambulatory Visit: Payer: Self-pay | Admitting: Family Medicine

## 2017-05-04 ENCOUNTER — Ambulatory Visit
Admission: RE | Admit: 2017-05-04 | Discharge: 2017-05-04 | Disposition: A | Discharge status: Home / Self Care | Payer: Medicare Other | Source: Ambulatory Visit | Attending: Internal Medicine | Admitting: Internal Medicine

## 2017-05-04 ENCOUNTER — Other Ambulatory Visit: Payer: Self-pay | Admitting: Internal Medicine

## 2017-05-04 DIAGNOSIS — R109 Unspecified abdominal pain: Secondary | ICD-10-CM

## 2017-06-20 ENCOUNTER — Ambulatory Visit: Payer: Medicare Other | Admitting: Student in an Organized Health Care Education/Training Program

## 2017-06-20 ENCOUNTER — Other Ambulatory Visit: Payer: Self-pay | Admitting: Student

## 2017-06-22 ENCOUNTER — Other Ambulatory Visit: Payer: Self-pay | Admitting: Student

## 2017-06-22 DIAGNOSIS — R932 Abnormal findings on diagnostic imaging of liver and biliary tract: Secondary | ICD-10-CM

## 2017-06-22 DIAGNOSIS — R140 Abdominal distension (gaseous): Secondary | ICD-10-CM

## 2017-06-27 ENCOUNTER — Ambulatory Visit: Payer: Medicare Other | Admitting: Student in an Organized Health Care Education/Training Program

## 2017-06-29 ENCOUNTER — Telehealth (INDEPENDENT_AMBULATORY_CARE_PROVIDER_SITE_OTHER): Payer: Self-pay

## 2017-06-29 NOTE — Telephone Encounter (Signed)
Patient called to ask she would need to bring her whole bag of medication on the day of her visit?  I called the patient back to let her know that she can bring her medications or a updated complete list with her to her appointment.

## 2017-06-29 NOTE — Telephone Encounter (Signed)
Patient called to see if we had received her referral from [facility] about the swelling in her legs and feet?  After research it does appear that we have received the referral as of 06/28/17.   Patient is waiting to be scheduled now.

## 2017-07-03 ENCOUNTER — Ambulatory Visit
Admission: RE | Admit: 2017-07-03 | Discharge: 2017-07-03 | Disposition: A | Discharge status: Home / Self Care | Payer: Medicare Other | Source: Ambulatory Visit | Attending: Student | Admitting: Student

## 2017-07-03 DIAGNOSIS — R932 Abnormal findings on diagnostic imaging of liver and biliary tract: Secondary | ICD-10-CM

## 2017-07-03 DIAGNOSIS — R140 Abdominal distension (gaseous): Secondary | ICD-10-CM

## 2017-07-10 ENCOUNTER — Ambulatory Visit (INDEPENDENT_AMBULATORY_CARE_PROVIDER_SITE_OTHER): Payer: Medicare Other | Admitting: Vascular Surgery

## 2017-07-10 ENCOUNTER — Encounter (INDEPENDENT_AMBULATORY_CARE_PROVIDER_SITE_OTHER): Payer: Self-pay | Admitting: Vascular Surgery

## 2017-07-10 VITALS — BP 111/76 | HR 80 | Resp 16 | Ht 67.0 in | Wt 159.8 lb

## 2017-07-10 DIAGNOSIS — I25118 Atherosclerotic heart disease of native coronary artery with other forms of angina pectoris: Secondary | ICD-10-CM

## 2017-07-10 DIAGNOSIS — I890 Lymphedema, not elsewhere classified: Secondary | ICD-10-CM | POA: Diagnosis not present

## 2017-07-10 DIAGNOSIS — I872 Venous insufficiency (chronic) (peripheral): Secondary | ICD-10-CM

## 2017-07-10 DIAGNOSIS — I739 Peripheral vascular disease, unspecified: Secondary | ICD-10-CM | POA: Diagnosis not present

## 2017-07-10 DIAGNOSIS — I10 Essential (primary) hypertension: Secondary | ICD-10-CM

## 2017-07-10 DIAGNOSIS — I7025 Atherosclerosis of native arteries of other extremities with ulceration: Secondary | ICD-10-CM | POA: Insufficient documentation

## 2017-07-10 NOTE — Progress Notes (Signed)
MRN : [id number]  Kristi [name] is a 77 y.o. (06-Feb-1941) female who presents with chief complaint of  Chief Complaint  Patient presents with  . New Patient (Initial Visit)    ref Jimmye Norman for bil leg swelling  .  History of Present Illness:   Patient is seen for evaluation of leg pain and leg swelling. The patient first noticed the swelling remotely. The swelling is associated with pain and discoloration. The pain and swelling worsens with prolonged dependency and improves with elevation. The pain is unrelated to activity.  The patient notes that in the morning the legs are significantly improved but they steadily worsened throughout the course of the day. The patient also notes a steady worsening of the discoloration in the ankle and shin area.   The patient denies claudication symptoms.  The patient denies symptoms consistent with rest pain.  The patient denies and extensive history of DJD and LS spine disease.  The patient has no had any past angiography, interventions or vascular surgery.  Elevation makes the leg symptoms better, dependency makes them much worse. There is no history of ulcerations. The patient denies any recent changes in medications.  The patient has not been wearing graduated compression.  The patient denies a history of DVT or PE. There is no prior history of phlebitis. There is no history of primary lymphedema.  No history of malignancies. No history of trauma or groin or pelvic surgery. There is no history of radiation treatment to the groin or pelvis  The patient denies amaurosis fugax or recent TIA symptoms. There are no recent neurological changes noted. The patient denies recent episodes of angina or shortness of breath  Current Meds  Medication Sig  . aspirin EC 81 MG tablet Take 81 mg by mouth daily.  . carbidopa-levodopa (SINEMET IR) 25-100 MG tablet Take by mouth.  . carvedilol (COREG) 3.125 MG tablet Take 1 tablet (3.125 mg total) by mouth 2  (two) times daily with a meal.  . cholecalciferol (VITAMIN D) 1000 UNITS tablet Take 1,000 Units by mouth daily. Reported on 07/08/2015  . docusate sodium (COLACE) 100 MG capsule Take 100 mg by mouth 2 (two) times daily.  . furosemide (LASIX) 40 MG tablet   . gabapentin (NEURONTIN) 300 MG capsule Take 30 mg by mouth at bedtime.  [name] glimepiride (AMARYL) 1 MG tablet Take 1 tablet (1 mg total) by mouth daily with breakfast.  . levothyroxine (SYNTHROID, LEVOTHROID) 75 MCG tablet   . Linaclotide (LINZESS) 145 MCG CAPS capsule Take 145 mcg by mouth daily.  [name] loratadine (CLARITIN) 10 MG tablet Take 10 mg by mouth daily.  [name] omeprazole (PRILOSEC) 20 MG capsule Take 20 mg by mouth daily.  . potassium chloride SA (K-DUR,KLOR-CON) 20 MEQ tablet   . rosuvastatin (CRESTOR) 20 MG tablet Take 20 mg by mouth daily.  . sertraline (ZOLOFT) 100 MG tablet Take 50-100 mg by mouth 2 (two) times daily. Take 100 mg in the am and 50 mg at bedtime.  . traZODone (DESYREL) 50 MG tablet Take 50 mg by mouth at bedtime as needed for sleep.     Past Medical History:  Diagnosis Date  . Anxiety   . Broken arm    left FA 3/17  . Broken arm    left  . CAD (coronary artery disease)    s/p MI  . Depression   . Diabetes mellitus without complication ([place])   . Hyperlipidemia   . Hypertension   . Hypothyroid   .  Neck pain 06/04/2014  . Parkinson's disease ([place])   . Spinal stenosis     Past Surgical History:  Procedure Laterality Date  . ABDOMINAL HYSTERECTOMY    . CORONARY ARTERY BYPASS GRAFT    . CORONARY STENT PLACEMENT    . ESOPHAGOGASTRODUODENOSCOPY (EGD) WITH PROPOFOL N/A 04/18/2017   Procedure: ESOPHAGOGASTRODUODENOSCOPY (EGD) WITH PROPOFOL;  Surgeon: [name], MD;  Location: [facility] ENDOSCOPY;  Service: Endoscopy;  Laterality: N/A;  . s/p pacer insertion      Social History Social History   Tobacco Use  . Smoking status: Never Smoker  . Smokeless tobacco: Never Used  Substance Use Topics  . Alcohol  use: No  . Drug use: No    Family History Family History  Problem Relation Age of Onset  . Cancer Mother   . Depression Mother   . Diabetes Mother   . Hypertension Mother   . Heart disease Father   No family history of bleeding/clotting disorders, porphyria or autoimmune disease   Allergies  Allergen Reactions  . Penicillins Anaphylaxis  . Penicillin G Swelling and Rash    Has patient had a PCN reaction causing immediate rash, facial/tongue/throat swelling, SOB or lightheadedness with hypotension: Yes Has patient had a PCN reaction causing severe rash involving mucus membranes or skin necrosis: No Has patient had a PCN reaction that required hospitalization: No Has patient had a PCN reaction occurring within the last 10 years: No If all of the above answers are "NO", then may proceed with Cephalosporin use.      REVIEW OF SYSTEMS (Negative unless checked)  Constitutional: [] Weight loss  [] Fever  [] Chills Cardiac: [] Chest pain   [] Chest pressure   [] Palpitations   [] Shortness of breath when laying flat   [] Shortness of breath with exertion. Vascular:  [x] Pain in legs with walking   [] Pain in legs at rest  [] History of DVT   [] Phlebitis   [x] Swelling in legs   [] Varicose veins   [] Non-healing ulcers Pulmonary:   [] Uses home oxygen   [] Productive cough   [] Hemoptysis   [] Wheeze  [] COPD   [] Asthma Neurologic:  [x] Dizziness   [] Seizures   [] History of stroke   [x] History of TIA  [] Aphasia   [] Vissual changes   [] Weakness or numbness in arm   [x] Weakness or numbness in leg Musculoskeletal:   [] Joint swelling   [] Joint pain   [] Low back pain Hematologic:  [] Easy bruising  [] Easy bleeding   [] Hypercoagulable state   [] Anemic Gastrointestinal:  [] Diarrhea   [] Vomiting  [] Gastroesophageal reflux/heartburn   [] Difficulty swallowing. Genitourinary:  [] Chronic kidney disease   [] Difficult urination  [] Frequent urination   [] Blood in urine Skin:  [x] Rashes   [] Ulcers  Psychological:   [] History of anxiety   []  History of major depression.  Physical Examination  Vitals:   07/10/17 0828  BP: 111/76  Pulse: 80  Resp: 16  Weight: 159 lb 12.8 oz (72.5 kg)  Height: 5\' 7"  (1.702 m)   Body mass index is 25.03 kg/m. Gen: WD/WN, NAD Head: [place]/AT, No temporalis wasting.  Ear/Nose/Throat: Hearing grossly intact, nares w/o erythema or drainage, poor dentition Eyes: PER, EOMI, sclera nonicteric.  Neck: Supple, no masses.  No bruit or JVD.  Pulmonary:  Good air movement, clear to auscultation bilaterally, no use of accessory muscles.  Cardiac: RRR, normal S1, S2, no Murmurs. Vascular: Scattered varicosities present extensively bilaterally.  severe venous stasis changes to the legs bilaterally.  4+ soft pitting edema Vessel Right Left  Radial Palpable Palpable  PT  Not Palpable Not Palpable  DP Not Palpable Not Palpable  Gastrointestinal: soft, non-distended. No guarding/no peritoneal signs.  Musculoskeletal: M/S 5/5 throughout.  No deformity or atrophy.  Neurologic: CN 2-12 intact. Pain and light touch intact in extremities.  Symmetrical.  Speech is fluent. Motor exam as listed above. Psychiatric: Judgment intact, Mood & affect appropriate for pt's clinical situation. Dermatologic: venous rashes no ulcers noted.  No changes consistent with cellulitis. Lymph : No Cervical lymphadenopathy, no lichenification or skin changes of chronic lymphedema.  CBC Lab Results  Component Value Date   WBC 4.5 04/27/2017   HGB 11.2 (L) 04/27/2017   HCT 33.6 (L) 04/27/2017   MCV 94.4 04/27/2017   PLT 234 04/27/2017    BMET    Component Value Date/Time   NA 140 04/27/2017 1458   NA 143 02/19/2014 2041   K 3.8 04/27/2017 1458   K 3.5 02/19/2014 2041   CL 101 04/27/2017 1458   CL 106 02/19/2014 2041   CO2 28 04/27/2017 1458   CO2 28 02/19/2014 2041   GLUCOSE 129 (H) 04/27/2017 1458   GLUCOSE 124 (H) 02/19/2014 2041   BUN 23 (H) 04/27/2017 1458   BUN 7 02/19/2014 2041    CREATININE 0.84 04/27/2017 1458   CREATININE 0.92 02/19/2014 2041   CALCIUM 9.3 04/27/2017 1458   CALCIUM 9.1 02/19/2014 2041   GFRNONAA >60 04/27/2017 1458   GFRNONAA >60 02/19/2014 2041   GFRAA >60 04/27/2017 1458   GFRAA >60 02/19/2014 2041   CrCl cannot be calculated (Patient's most recent lab result is older than the maximum 21 days allowed.).  COAG Lab Results  Component Value Date   INR 0.86 12/04/2016   INR 0.98 11/17/2016    Radiology US Abdomen Complete  Result Date: 07/03/2017 CLINICAL DATA:  Abdominal bloating. EXAM: ABDOMEN ULTRASOUND COMPLETE COMPARISON:  CT scan of April 11, 2017. FINDINGS: Gallbladder: No gallstones or wall thickening visualized. No sonographic Murphy sign noted by sonographer. Common bile duct: Diameter: 4.2 mm which is within normal limits. Liver: No focal lesion identified. Within normal limits in parenchymal echogenicity. Portal vein is patent on color Doppler imaging with normal direction of blood flow towards the liver. IVC: No abnormality visualized. Pancreas: Visualized portion unremarkable. Spleen: Size and appearance within normal limits. Right Kidney: Length: 8.6 cm. Echogenicity within normal limits. No mass or hydronephrosis visualized. Left Kidney: Length: 8.5 cm. Echogenicity within normal limits. No mass or hydronephrosis visualized. Abdominal aorta: No aneurysm visualized. Other findings: None. IMPRESSION: No abnormality seen in the abdomen. Hepatic lesion noted on prior CT is not visualized on the current exam. Electronically Signed   By: Marijo Conception, M.D.   On: 07/03/2017 09:50     Assessment/Plan 1. Chronic venous insufficiency No surgery or intervention at this point in time.    I have had a long discussion with the patient regarding venous insufficiency and why it  causes symptoms. I have discussed with the patient the chronic skin changes that accompany venous insufficiency and the long term sequela such as infection and  ulceration.  Patient will begin wearing graduated compression stockings class 1 (20-30 mmHg) or compression wraps on a daily basis a prescription was given. The patient will put the stockings on first thing in the morning and removing them in the evening. The patient is instructed specifically not to sleep in the stockings.    In addition, behavioral modification including several periods of elevation of the lower extremities during the day will be continued. I  have demonstrated that proper elevation is a position with the ankles at heart level.  The patient is instructed to begin routine exercise, especially walking on a daily basis  Patient should undergo duplex ultrasound of the venous system to ensure that DVT or reflux is not present.  Following the review of the ultrasound the patient will follow up in 2-3 months to reassess the degree of swelling and the control that graduated compression stockings or compression wraps  is offering.   The patient can be assessed for a Lymph Pump at that time  - VAS [place] LOWER EXTREMITY VENOUS (DVT); Future  2. Lymphedema I have had a long discussion with the patient regarding swelling and why it  causes symptoms.  Patient will begin wearing graduated compression stockings class 1 (20-30 mmHg) on a daily basis a prescription was given. The patient will  beginning wearing the stockings first thing in the morning and removing them in the evening. The patient is instructed specifically not to sleep in the stockings.   In addition, behavioral modification will be initiated.  This will include frequent elevation, use of over the counter pain medications and exercise such as walking.  I have reviewed systemic causes for chronic edema such as liver, kidney and cardiac etiologies.  The patient denies problems with these organ systems.    Consideration for a lymph pump will also be made based upon the effectiveness of conservative therapy.  This would help to improve  the edema control and prevent sequela such as ulcers and infections   Patient should undergo duplex ultrasound of the venous system to ensure that DVT or reflux is not present.  The patient will follow-up with me after the ultrasound.    3. PAD (peripheral artery disease) (HCC)  Recommend:  The patient has atypical pain symptoms for pure atherosclerotic disease. However, on physical exam there is evidence of mixed venous and arterial disease, given the diminished pulses and the edema associated with venous changes of the legs.  Noninvasive studies including ABI's and venous ultrasound of the legs will be obtained and the patient will follow up with me to review these studies.  The patient should continue walking and begin a more formal exercise program. The patient should continue his antiplatelet therapy and aggressive treatment of the lipid abnormalities.  The patient should begin wearing graduated compression socks 15-20 mmHg strength to control edema.   - VAS [place] ABI WITH/WO TBI; Future  4. Coronary artery disease of native artery of native heart with stable angina pectoris (HCC) Continue cardiac and antihypertensive medications as already ordered and reviewed, no changes at this time.  Continue statin as ordered and reviewed, no changes at this time  Nitrates PRN for chest pain   5. Essential hypertension Continue antihypertensive medications as already ordered, these medications have been reviewed and there are no changes at this time.    Hortencia Pilar, MD  07/10/2017 8:35 AM

## 2017-07-11 ENCOUNTER — Telehealth (INDEPENDENT_AMBULATORY_CARE_PROVIDER_SITE_OTHER): Payer: Self-pay | Admitting: Vascular Surgery

## 2017-07-11 NOTE — Telephone Encounter (Signed)
Patient asking for additional medical supply that carry the farrow wrap because she stated [facility] did not.I ask the patient did she have any family members they could help her but she stated she didn't but she stated that she would try to find a place that she could get the wraps she prefer.

## 2017-08-21 ENCOUNTER — Other Ambulatory Visit: Payer: Self-pay | Admitting: Student

## 2017-08-21 DIAGNOSIS — K5909 Other constipation: Secondary | ICD-10-CM

## 2017-08-21 DIAGNOSIS — R1084 Generalized abdominal pain: Secondary | ICD-10-CM

## 2017-08-21 DIAGNOSIS — R140 Abdominal distension (gaseous): Secondary | ICD-10-CM

## 2017-09-05 ENCOUNTER — Ambulatory Visit
Admission: RE | Admit: 2017-09-05 | Discharge: 2017-09-05 | Disposition: A | Discharge status: Home / Self Care | Payer: Medicare Other | Source: Ambulatory Visit | Attending: Student | Admitting: Student

## 2017-09-05 DIAGNOSIS — M4046 Postural lordosis, lumbar region: Secondary | ICD-10-CM | POA: Diagnosis not present

## 2017-09-05 DIAGNOSIS — K449 Diaphragmatic hernia without obstruction or gangrene: Secondary | ICD-10-CM | POA: Diagnosis not present

## 2017-09-05 DIAGNOSIS — K5909 Other constipation: Secondary | ICD-10-CM

## 2017-09-05 DIAGNOSIS — I700 Atherosclerosis of aorta: Secondary | ICD-10-CM | POA: Insufficient documentation

## 2017-09-05 DIAGNOSIS — R140 Abdominal distension (gaseous): Secondary | ICD-10-CM

## 2017-09-05 DIAGNOSIS — R1084 Generalized abdominal pain: Secondary | ICD-10-CM

## 2017-09-05 DIAGNOSIS — Z01812 Encounter for preprocedural laboratory examination: Secondary | ICD-10-CM | POA: Diagnosis present

## 2017-09-05 HISTORY — DX: Malignant (primary) neoplasm, unspecified: C80.1

## 2017-09-05 LAB — POCT I-STAT CREATININE: Creatinine, Ser: 0.8 mg/dL (ref 0.44–1.00)

## 2017-09-05 MED ORDER — IOPAMIDOL (ISOVUE-300) INJECTION 61%
100.0000 mL | Freq: Once | INTRAVENOUS | Status: AC | PRN
  Administered 2017-09-05: 100 mL via INTRAVENOUS

## 2017-09-11 ENCOUNTER — Other Ambulatory Visit: Payer: Self-pay | Admitting: Student

## 2017-09-11 DIAGNOSIS — K5909 Other constipation: Secondary | ICD-10-CM

## 2017-09-11 DIAGNOSIS — R1084 Generalized abdominal pain: Secondary | ICD-10-CM

## 2017-09-11 DIAGNOSIS — R140 Abdominal distension (gaseous): Secondary | ICD-10-CM

## 2017-09-30 ENCOUNTER — Encounter
Admission: RE | Admit: 2017-09-30 | Discharge: 2017-09-30 | Disposition: A | Discharge status: Home / Self Care | Payer: Medicare Other | Source: Ambulatory Visit | Attending: Student | Admitting: Student

## 2017-09-30 DIAGNOSIS — R140 Abdominal distension (gaseous): Secondary | ICD-10-CM | POA: Insufficient documentation

## 2017-09-30 DIAGNOSIS — K5909 Other constipation: Secondary | ICD-10-CM | POA: Diagnosis present

## 2017-09-30 DIAGNOSIS — R1084 Generalized abdominal pain: Secondary | ICD-10-CM | POA: Diagnosis present

## 2017-09-30 MED ORDER — TECHNETIUM TC 99M SULFUR COLLOID
2.0100 | Freq: Once | INTRAVENOUS | Status: AC | PRN
  Administered 2017-09-30: 2.01 via INTRAVENOUS

## 2017-10-05 ENCOUNTER — Other Ambulatory Visit (INDEPENDENT_AMBULATORY_CARE_PROVIDER_SITE_OTHER): Payer: Self-pay | Admitting: Vascular Surgery

## 2017-10-05 DIAGNOSIS — I872 Venous insufficiency (chronic) (peripheral): Secondary | ICD-10-CM

## 2017-10-05 DIAGNOSIS — R0989 Other specified symptoms and signs involving the circulatory and respiratory systems: Secondary | ICD-10-CM

## 2017-10-05 DIAGNOSIS — I890 Lymphedema, not elsewhere classified: Secondary | ICD-10-CM

## 2017-10-09 ENCOUNTER — Encounter (INDEPENDENT_AMBULATORY_CARE_PROVIDER_SITE_OTHER): Payer: Self-pay | Admitting: Vascular Surgery

## 2017-10-09 ENCOUNTER — Ambulatory Visit (INDEPENDENT_AMBULATORY_CARE_PROVIDER_SITE_OTHER): Payer: Medicare Other

## 2017-10-09 ENCOUNTER — Ambulatory Visit (INDEPENDENT_AMBULATORY_CARE_PROVIDER_SITE_OTHER): Payer: Medicare Other | Admitting: Vascular Surgery

## 2017-10-09 VITALS — BP 118/81 | HR 89 | Resp 12 | Ht 66.0 in | Wt 165.0 lb

## 2017-10-09 DIAGNOSIS — E782 Mixed hyperlipidemia: Secondary | ICD-10-CM

## 2017-10-09 DIAGNOSIS — I25118 Atherosclerotic heart disease of native coronary artery with other forms of angina pectoris: Secondary | ICD-10-CM | POA: Diagnosis not present

## 2017-10-09 DIAGNOSIS — I890 Lymphedema, not elsewhere classified: Secondary | ICD-10-CM

## 2017-10-09 DIAGNOSIS — I10 Essential (primary) hypertension: Secondary | ICD-10-CM

## 2017-10-09 DIAGNOSIS — M5410 Radiculopathy, site unspecified: Secondary | ICD-10-CM | POA: Diagnosis not present

## 2017-10-09 DIAGNOSIS — R0989 Other specified symptoms and signs involving the circulatory and respiratory systems: Secondary | ICD-10-CM

## 2017-10-09 DIAGNOSIS — I872 Venous insufficiency (chronic) (peripheral): Secondary | ICD-10-CM

## 2017-10-09 DIAGNOSIS — G8929 Other chronic pain: Secondary | ICD-10-CM

## 2017-10-09 NOTE — Progress Notes (Signed)
MRN : [id number]  Kristi [name] is a 77 y.o. (March 09, 1941) female who presents with chief complaint of  Chief Complaint  Patient presents with  . Follow-up    3 month ABI and venous DVT  .  History of Present Illness: The patient returns to the office for followup and review of the noninvasive studies. There have been no interval changes in lower extremity symptoms she continues to have pain in her lower extremities not related to ambulation and occurring at variable times and during variable activities.. No interval shortening of the patient's claudication distance or development of rest pain symptoms. No new ulcers or wounds have occurred since the last visit.  There have been no significant changes to the patient's overall health care.  The patient denies amaurosis fugax or recent TIA symptoms. There are no recent neurological changes noted. The patient denies history of DVT, PE or superficial thrombophlebitis. The patient denies recent episodes of angina or shortness of breath.   ABI Rt=1.19 and Lt=0.99 triphasic signals noted bilaterally Duplex ultrasound of the venous system bilateral lower extremities demonstrates significant venous insufficiency of the deep system bilaterally.  On the right there is reflux within the great saphenous vein however the great saphenous vein is not enlarged measuring approximately 5 mm in diameter  Current Meds  Medication Sig  . aspirin EC 81 MG tablet Take 81 mg by mouth daily.  . carbidopa-levodopa (SINEMET IR) 25-100 MG tablet Take by mouth.  . carvedilol (COREG) 3.125 MG tablet Take 1 tablet (3.125 mg total) by mouth 2 (two) times daily with a meal.  . cholecalciferol (VITAMIN D) 1000 UNITS tablet Take 1,000 Units by mouth daily. Reported on 07/08/2015  . clonazePAM (KLONOPIN) 0.5 MG tablet Take 0.5 mg by mouth 2 (two) times daily.  . clopidogrel (PLAVIX) 75 MG tablet   . docusate sodium (COLACE) 100 MG capsule Take 100 mg by mouth 2 (two)  times daily.  . fluticasone (FLONASE) 50 MCG/ACT nasal spray Place 2 sprays into both nostrils daily.  . furosemide (LASIX) 40 MG tablet   . gabapentin (NEURONTIN) 300 MG capsule Take 30 mg by mouth at bedtime.  [name] glimepiride (AMARYL) 1 MG tablet Take 1 tablet (1 mg total) by mouth daily with breakfast.  . levothyroxine (SYNTHROID, LEVOTHROID) 75 MCG tablet   . Linaclotide (LINZESS) 145 MCG CAPS capsule Take 145 mcg by mouth daily.  [name] loratadine (CLARITIN) 10 MG tablet Take 10 mg by mouth daily.  . montelukast (SINGULAIR) 10 MG tablet   . Multiple Vitamins-Minerals (ALIVE WOMENS ENERGY PO) Take 1 tablet by mouth daily.  . nitroGLYCERIN (NITROSTAT) 0.4 MG SL tablet Place 0.4 mg under the tongue every 5 (five) minutes as needed for chest pain.   [name] olopatadine (PATANOL) 0.1 % ophthalmic solution 1 drop 2 (two) times daily.  [name] omeprazole (PRILOSEC) 20 MG capsule Take 20 mg by mouth daily.  [name] oxyCODONE-acetaminophen (PERCOCET/ROXICET) 5-325 MG tablet Take 1 tablet by mouth every 6 (six) hours as needed for severe pain.  . pioglitazone (ACTOS) 30 MG tablet pioglitazone 30 mg tablet  . potassium chloride SA (K-DUR,KLOR-CON) 20 MEQ tablet   . rosuvastatin (CRESTOR) 20 MG tablet Take 20 mg by mouth daily.  . sertraline (ZOLOFT) 100 MG tablet Take 50-100 mg by mouth 2 (two) times daily. Take 100 mg in the am and 50 mg at bedtime.  [name] thiothixene (NAVANE) 5 MG capsule Take 5 mg by mouth 2 (two) times daily.  . traMADol-acetaminophen (ULTRACET)  37.5-325 MG tablet Take 1 tablet by mouth every 6 (six) hours as needed for pain.  . traZODone (DESYREL) 50 MG tablet Take 50 mg by mouth at bedtime as needed for sleep.   . vitamin B-12 (CYANOCOBALAMIN) 500 MCG tablet Take 500 mcg by mouth daily.     Past Medical History:  Diagnosis Date  . Anxiety   . Broken arm    left FA 3/17  . Broken arm    left  . CAD (coronary artery disease)    s/p MI  . Cancer ([place])    uterine  . Depression   . Diabetes  mellitus without complication ([place])   . Hyperlipidemia   . Hypertension   . Hypothyroid   . Neck pain 06/04/2014  . Parkinson's disease ([place])   . Spinal stenosis     Past Surgical History:  Procedure Laterality Date  . ABDOMINAL HYSTERECTOMY    . CORONARY ARTERY BYPASS GRAFT    . CORONARY STENT PLACEMENT    . ESOPHAGOGASTRODUODENOSCOPY (EGD) WITH PROPOFOL N/A 04/18/2017   Procedure: ESOPHAGOGASTRODUODENOSCOPY (EGD) WITH PROPOFOL;  Surgeon: [name], MD;  Location: [facility] ENDOSCOPY;  Service: Endoscopy;  Laterality: N/A;  . s/p pacer insertion      Social History Social History   Tobacco Use  . Smoking status: Never Smoker  . Smokeless tobacco: Never Used  Substance Use Topics  . Alcohol use: No  . Drug use: No    Family History Family History  Problem Relation Age of Onset  . Cancer Mother   . Depression Mother   . Diabetes Mother   . Hypertension Mother   . Heart disease Father     Allergies  Allergen Reactions  . Penicillins Anaphylaxis  . Penicillin G Swelling and Rash    Has patient had a PCN reaction causing immediate rash, facial/tongue/throat swelling, SOB or lightheadedness with hypotension: Yes Has patient had a PCN reaction causing severe rash involving mucus membranes or skin necrosis: No Has patient had a PCN reaction that required hospitalization: No Has patient had a PCN reaction occurring within the last 10 years: No If all of the above answers are "NO", then may proceed with Cephalosporin use.      REVIEW OF SYSTEMS (Negative unless checked)  Constitutional: [] Weight loss  [] Fever  [] Chills Cardiac: [] Chest pain   [] Chest pressure   [] Palpitations   [] Shortness of breath when laying flat   [] Shortness of breath with exertion. Vascular:  [x] Pain in legs with walking   [x] Pain in legs at rest  [] History of DVT   [] Phlebitis   [] Swelling in legs   [] Varicose veins   [] Non-healing ulcers Pulmonary:   [] Uses home oxygen   [] Productive cough    [] Hemoptysis   [] Wheeze  [] COPD   [] Asthma Neurologic:  [] Dizziness   [] Seizures   [] History of stroke   [] History of TIA  [] Aphasia   [] Vissual changes   [] Weakness or numbness in arm   [] Weakness or numbness in leg Musculoskeletal:   [] Joint swelling   [x] Joint pain   [x] Low back pain Hematologic:  [] Easy bruising  [] Easy bleeding   [] Hypercoagulable state   [] Anemic Gastrointestinal:  [] Diarrhea   [] Vomiting  [] Gastroesophageal reflux/heartburn   [] Difficulty swallowing. Genitourinary:  [] Chronic kidney disease   [] Difficult urination  [] Frequent urination   [] Blood in urine Skin:  [] Rashes   [] Ulcers  Psychological:  [] History of anxiety   []  History of major depression.  Physical Examination  Vitals:   10/09/17 1510  BP:  118/81  Pulse: 89  Resp: 12  Weight: 165 lb (74.8 kg)  Height: 5\' 6"  (1.676 m)   Body mass index is 26.63 kg/m. Gen: WD/WN, NAD Head: [place]/AT, No temporalis wasting.  Ear/Nose/Throat: Hearing grossly intact, nares w/o erythema or drainage Eyes: PER, EOMI, sclera nonicteric.  Neck: Supple, no large masses.   Pulmonary:  Good air movement, no audible wheezing bilaterally, no use of accessory muscles.  Cardiac: RRR, no JVD Vascular: scattered varicosities present bilaterally.  Moderate venous stasis changes to the legs bilaterally.  3+ soft pitting edema Vessel Right Left  Radial Palpable Palpable  PT  trace palpable  trace palpable  DP  trace palpable  trace palpable  Gastrointestinal: Non-distended. No guarding/no peritoneal signs.  Musculoskeletal: M/S 5/5 throughout.  No deformity or atrophy.  Neurologic: CN 2-12 intact. Symmetrical.  Speech is fluent. Motor exam as listed above. Psychiatric: Judgment intact, Mood & affect appropriate for pt's clinical situation. Dermatologic: Mild venous rashes no ulcers noted.  No changes consistent with cellulitis. Lymph : No lichenification or skin changes of chronic lymphedema.  CBC Lab Results  Component Value Date    WBC 4.5 04/27/2017   HGB 11.2 (L) 04/27/2017   HCT 33.6 (L) 04/27/2017   MCV 94.4 04/27/2017   PLT 234 04/27/2017    BMET    Component Value Date/Time   NA 140 04/27/2017 1458   NA 143 02/19/2014 2041   K 3.8 04/27/2017 1458   K 3.5 02/19/2014 2041   CL 101 04/27/2017 1458   CL 106 02/19/2014 2041   CO2 28 04/27/2017 1458   CO2 28 02/19/2014 2041   GLUCOSE 129 (H) 04/27/2017 1458   GLUCOSE 124 (H) 02/19/2014 2041   BUN 23 (H) 04/27/2017 1458   BUN 7 02/19/2014 2041   CREATININE 0.80 09/05/2017 1035   CREATININE 0.92 02/19/2014 2041   CALCIUM 9.3 04/27/2017 1458   CALCIUM 9.1 02/19/2014 2041   GFRNONAA >60 04/27/2017 1458   GFRNONAA >60 02/19/2014 2041   GFRAA >60 04/27/2017 1458   GFRAA >60 02/19/2014 2041   CrCl cannot be calculated (Patient's most recent lab result is older than the maximum 21 days allowed.).  COAG Lab Results  Component Value Date   INR 0.86 12/04/2016   INR 0.98 11/17/2016    Radiology Nm Gastric Emptying  Result Date: 09/30/2017 CLINICAL DATA:  Abdominal pain and bloating for 2-3 months. Gas, acid reflux, and history of diabetes. EXAM: NUCLEAR MEDICINE GASTRIC EMPTYING SCAN TECHNIQUE: After oral ingestion of radiolabeled meal, sequential abdominal images were obtained for 4 hours. Percentage of activity emptying the stomach was calculated at 1 hour, 2 hour, 3 hour, and 4 hours. RADIOPHARMACEUTICALS:  2.95 millicuries mCi MW-41L sulfur colloid in standardized meal COMPARISON:  CT of the abdomen and pelvis on 08/26/2017 FINDINGS: Expected location of the stomach in the left upper quadrant. Ingested meal empties the stomach gradually over the course of the study. 41% emptied at 1 hr ( normal >= 10%) 83% emptied at 2 hr ( normal >= 40%) 95% emptied at 3 hr ( normal >= 70%) 99% emptied at 4 hr ( normal >= 90%) IMPRESSION: Normal gastric emptying study. Electronically Signed   By: Nolon Nations M.D.   On: 09/30/2017 19:53     Assessment/Plan 1.  Chronic lower extremity radicular pain (Primary Source of Pain) (Bilateral) (R>L) Recommend:  I do not find evidence of Vascular pathology that would explain the patient's symptoms  The patient has atypical pain symptoms for vascular  disease  Noninvasive studies including venous ultrasound of the legs do not identify vascular problems  The patient should continue walking and begin a more formal exercise program. The patient should continue his antiplatelet therapy and aggressive treatment of the lipid abnormalities. The patient should begin wearing graduated compression socks 15-20 mmHg strength to control her mild edema.  Further work-up of her lower extremity pain is deferred to the primary service     2. Lymphedema Recommend:  No surgery or intervention at this point in time.    I have reviewed my previous discussion with the patient regarding swelling and why it causes symptoms.  Patient will continue wearing graduated compression stockings class 1 (20-30 mmHg) on a daily basis. The patient will  beginning wearing the stockings first thing in the morning and removing them in the evening. The patient is instructed specifically not to sleep in the stockings.    In addition, behavioral modification including several periods of elevation of the lower extremities during the day will be continued.  This was reviewed with the patient during the initial visit.  The patient will also continue routine exercise, especially walking on a daily basis as was discussed during the initial visit.    Despite conservative treatments including graduated compression therapy class 1 and behavioral modification including exercise and elevation the patient  has not obtained adequate control of the lymphedema.  The patient still has stage 3 lymphedema and therefore, I believe that a lymph pump should be added to improve the control of the patient's lymphedema.  Additionally, a lymph pump is warranted because it  will reduce the risk of cellulitis and ulceration in the future.  Of note the patient has a significant history of coronary artery disease and surgery in the past.  Her right great saphenous vein does demonstrate some reflux but it remains of normal size and caliber and adequate as a conduit for future bypass.  Furthermore, the patient has moderate to severe deep venous reflux and therefore compression elevation lymphedema pump and other conservative therapies will be required regardless of treating her great saphenous vein on the right.  For all these factors combined I do not recommend laser ablation of the great saphenous vein under the circumstances.  This was explained to the patient.  Patient should follow-up in six months    3. Coronary artery disease of native artery of native heart with stable angina pectoris (HCC) Continue cardiac and antihypertensive medications as already ordered and reviewed, no changes at this time.  Continue statin as ordered and reviewed, no changes at this time  Nitrates PRN for chest pain   4. Essential hypertension Continue antihypertensive medications as already ordered, these medications have been reviewed and there are no changes at this time.   5. Mixed hyperlipidemia Continue statin as ordered and reviewed, no changes at this time    Hortencia Pilar, MD  10/09/2017 3:15 PM

## 2017-10-11 ENCOUNTER — Encounter (INDEPENDENT_AMBULATORY_CARE_PROVIDER_SITE_OTHER): Payer: Self-pay | Admitting: Vascular Surgery

## 2017-11-03 ENCOUNTER — Ambulatory Visit (INDEPENDENT_AMBULATORY_CARE_PROVIDER_SITE_OTHER): Payer: Medicare Other | Admitting: Podiatry

## 2017-11-03 ENCOUNTER — Encounter: Payer: Self-pay | Admitting: Podiatry

## 2017-11-03 DIAGNOSIS — L989 Disorder of the skin and subcutaneous tissue, unspecified: Secondary | ICD-10-CM | POA: Diagnosis not present

## 2017-11-03 NOTE — Progress Notes (Signed)
   Subjective: 77 year old female presenting today for follow up evaluation of pain to the posterior left heel secondary to a callus lesion. She reports the pain is present and has not changed. She also reports throbbing pain to the distal aspect of the right third toe secondary to a callus lesion. Walking increases the pain. She has not done anything for treatment at home. Patient is here for further evaluation and treatment.   Past Medical History:  Diagnosis Date  . Anxiety   . Broken arm    left FA 3/17  . Broken arm    left  . CAD (coronary artery disease)    s/p MI  . Cancer ([place])    uterine  . Depression   . Diabetes mellitus without complication ([place])   . Hyperlipidemia   . Hypertension   . Hypothyroid   . Neck pain 06/04/2014  . Parkinson's disease ([place])   . Spinal stenosis      Objective:  Physical Exam General: Alert and oriented x3 in no acute distress  Dermatology: Hyperkeratotic lesions present on the right foot x 2. Pain on palpation with a central nucleated core noted. Skin is warm, dry and supple bilateral lower extremities. Negative for open lesions or macerations.  Vascular: Palpable pedal pulses bilaterally. No edema or erythema noted. Capillary refill within normal limits.  Neurological: Epicritic and protective threshold grossly intact bilaterally.   Musculoskeletal Exam: Pain on palpation at the keratotic lesion noted. Range of motion within normal limits bilateral. Muscle strength 5/5 in all groups bilateral.  Assessment: 1. Porokeratosis right foot x 2   Plan of Care:  1. Patient evaluated 2. Excisional debridement of keratoic lesion using a chisel blade was performed without incident.  3. Dressed area with light dressing. 4. Recommended good shoe gear.  5. Patient is to return to the clinic PRN.   Edrick Kins, DPM [facility]  Dr. Edrick Kins, [address]                                          [place], [place] [address]                Office [phone]  Fax [phone]

## 2017-11-28 ENCOUNTER — Encounter: Payer: Self-pay | Admitting: *Deleted

## 2017-11-29 ENCOUNTER — Encounter
Admission: RE | Disposition: A | Discharge status: Home / Self Care | Payer: Self-pay | Source: Ambulatory Visit | Attending: Unknown Physician Specialty

## 2017-11-29 ENCOUNTER — Ambulatory Visit: Payer: Medicare Other | Admitting: Anesthesiology

## 2017-11-29 ENCOUNTER — Ambulatory Visit
Admission: RE | Admit: 2017-11-29 | Discharge: 2017-11-29 | Disposition: A | Discharge status: Home / Self Care | Payer: Medicare Other | Source: Ambulatory Visit | Attending: Unknown Physician Specialty | Admitting: Unknown Physician Specialty

## 2017-11-29 DIAGNOSIS — F419 Anxiety disorder, unspecified: Secondary | ICD-10-CM | POA: Diagnosis not present

## 2017-11-29 DIAGNOSIS — Z79899 Other long term (current) drug therapy: Secondary | ICD-10-CM | POA: Diagnosis not present

## 2017-11-29 DIAGNOSIS — Z7902 Long term (current) use of antithrombotics/antiplatelets: Secondary | ICD-10-CM | POA: Insufficient documentation

## 2017-11-29 DIAGNOSIS — E785 Hyperlipidemia, unspecified: Secondary | ICD-10-CM | POA: Diagnosis not present

## 2017-11-29 DIAGNOSIS — I10 Essential (primary) hypertension: Secondary | ICD-10-CM | POA: Insufficient documentation

## 2017-11-29 DIAGNOSIS — Z880 Allergy status to penicillin: Secondary | ICD-10-CM | POA: Diagnosis not present

## 2017-11-29 DIAGNOSIS — I252 Old myocardial infarction: Secondary | ICD-10-CM | POA: Diagnosis not present

## 2017-11-29 DIAGNOSIS — I2510 Atherosclerotic heart disease of native coronary artery without angina pectoris: Secondary | ICD-10-CM | POA: Insufficient documentation

## 2017-11-29 DIAGNOSIS — K219 Gastro-esophageal reflux disease without esophagitis: Secondary | ICD-10-CM | POA: Diagnosis not present

## 2017-11-29 DIAGNOSIS — K620 Anal polyp: Secondary | ICD-10-CM | POA: Insufficient documentation

## 2017-11-29 DIAGNOSIS — E039 Hypothyroidism, unspecified: Secondary | ICD-10-CM | POA: Diagnosis not present

## 2017-11-29 DIAGNOSIS — K2950 Unspecified chronic gastritis without bleeding: Secondary | ICD-10-CM | POA: Insufficient documentation

## 2017-11-29 DIAGNOSIS — Z955 Presence of coronary angioplasty implant and graft: Secondary | ICD-10-CM | POA: Diagnosis not present

## 2017-11-29 DIAGNOSIS — F329 Major depressive disorder, single episode, unspecified: Secondary | ICD-10-CM | POA: Insufficient documentation

## 2017-11-29 DIAGNOSIS — I6930 Unspecified sequelae of cerebral infarction: Secondary | ICD-10-CM | POA: Diagnosis not present

## 2017-11-29 DIAGNOSIS — G20 Parkinson's disease: Secondary | ICD-10-CM | POA: Insufficient documentation

## 2017-11-29 DIAGNOSIS — E119 Type 2 diabetes mellitus without complications: Secondary | ICD-10-CM | POA: Insufficient documentation

## 2017-11-29 DIAGNOSIS — Z7982 Long term (current) use of aspirin: Secondary | ICD-10-CM | POA: Insufficient documentation

## 2017-11-29 DIAGNOSIS — K5730 Diverticulosis of large intestine without perforation or abscess without bleeding: Secondary | ICD-10-CM | POA: Insufficient documentation

## 2017-11-29 DIAGNOSIS — Z8542 Personal history of malignant neoplasm of other parts of uterus: Secondary | ICD-10-CM | POA: Diagnosis not present

## 2017-11-29 DIAGNOSIS — Z950 Presence of cardiac pacemaker: Secondary | ICD-10-CM | POA: Diagnosis not present

## 2017-11-29 DIAGNOSIS — K449 Diaphragmatic hernia without obstruction or gangrene: Secondary | ICD-10-CM | POA: Insufficient documentation

## 2017-11-29 DIAGNOSIS — Z951 Presence of aortocoronary bypass graft: Secondary | ICD-10-CM | POA: Diagnosis not present

## 2017-11-29 DIAGNOSIS — Z7984 Long term (current) use of oral hypoglycemic drugs: Secondary | ICD-10-CM | POA: Insufficient documentation

## 2017-11-29 DIAGNOSIS — R1084 Generalized abdominal pain: Secondary | ICD-10-CM | POA: Diagnosis present

## 2017-11-29 HISTORY — PX: ESOPHAGOGASTRODUODENOSCOPY (EGD) WITH PROPOFOL: SHX5813

## 2017-11-29 HISTORY — DX: Cervical disc disorder, unspecified, unspecified cervical region: M50.90

## 2017-11-29 HISTORY — PX: COLONOSCOPY WITH PROPOFOL: SHX5780

## 2017-11-29 LAB — GLUCOSE, CAPILLARY: Glucose-Capillary: 119 mg/dL — ABNORMAL HIGH (ref 70–99)

## 2017-11-29 SURGERY — COLONOSCOPY WITH PROPOFOL
Anesthesia: General

## 2017-11-29 MED ORDER — PROPOFOL 10 MG/ML IV BOLUS
INTRAVENOUS | Status: DC | PRN
  Administered 2017-11-29 (×2): 50 mg via INTRAVENOUS

## 2017-11-29 MED ORDER — PROPOFOL 500 MG/50ML IV EMUL
INTRAVENOUS | Status: DC | PRN
  Administered 2017-11-29: 120 ug/kg/min via INTRAVENOUS

## 2017-11-29 MED ORDER — LIDOCAINE 2% (20 MG/ML) 5 ML SYRINGE
INTRAMUSCULAR | Status: DC | PRN
  Administered 2017-11-29: 25 mg via INTRAVENOUS

## 2017-11-29 MED ORDER — SODIUM CHLORIDE 0.9 % IV SOLN
INTRAVENOUS | Status: DC | PRN
  Administered 2017-11-29: 1000 mL
  Administered 2017-11-29: 09:00:00 via INTRAVENOUS

## 2017-11-29 NOTE — Anesthesia Post-op Follow-up Note (Signed)
Anesthesia QCDR form completed.        

## 2017-11-29 NOTE — Transfer of Care (Signed)
Immediate Anesthesia Transfer of Care Note  [name]  Procedure(s) Performed: COLONOSCOPY WITH PROPOFOL (N/A ) ESOPHAGOGASTRODUODENOSCOPY (EGD) WITH PROPOFOL (N/A )  Patient Location: Endoscopy Unit  Anesthesia Type:General  Level of Consciousness: awake  Airway & Oxygen Therapy: Patient Spontanous Breathing and Patient connected to nasal cannula oxygen  Post-op Assessment: Report given to RN and Post -op Vital signs reviewed and stable  Post vital signs: Reviewed  Last Vitals:  Vitals Value Taken Time  BP 99/70 11/29/2017  9:27 AM  Temp 36.6 C 11/29/2017  9:20 AM  Pulse 72 11/29/2017  9:27 AM  Resp 17 11/29/2017  9:27 AM  SpO2 100 % 11/29/2017  9:27 AM    Last Pain:  Vitals:   11/29/17 0824  TempSrc: Tympanic  PainSc: 0-No pain         Complications: No apparent anesthesia complications

## 2017-11-29 NOTE — Anesthesia Postprocedure Evaluation (Signed)
Anesthesia Post Note  Patient: Tiny Chaudhary Saran  Procedure(s) Performed: COLONOSCOPY WITH PROPOFOL (N/A ) ESOPHAGOGASTRODUODENOSCOPY (EGD) WITH PROPOFOL (N/A )  Patient location during evaluation: Endoscopy Anesthesia Type: General Level of consciousness: awake and alert Pain management: pain level controlled Vital Signs Assessment: post-procedure vital signs reviewed and stable Respiratory status: spontaneous breathing, nonlabored ventilation, respiratory function stable and patient connected to nasal cannula oxygen Cardiovascular status: blood pressure returned to baseline and stable Postop Assessment: no apparent nausea or vomiting Anesthetic complications: no     Last Vitals:  Vitals:   11/29/17 0950 11/29/17 1000  BP: 140/88 (!) 146/92  Pulse: (!) 57   Resp: 17 17  Temp:    SpO2: 91% 100%    Last Pain:  Vitals:   11/29/17 0824  TempSrc: Tympanic  PainSc: 0-No pain                 Precious Haws Jenay Morici

## 2017-11-29 NOTE — Op Note (Addendum)
[facility] Patient Name: Kristi [name] Procedure Date: 11/29/2017 8:45 AM MRN: [id number] Account #: [id number] Date of Birth: 1940/08/09 Admit Type: Outpatient Age: 77 Room: [facility] ENDO ROOM 3 Gender: Female Note Status: Finalized Procedure:            Colonoscopy Indications:          Generalized abdominal pain Providers:            [name], MD Referring MD:         Lorelee Market (Referring MD) Medicines:            Propofol per Anesthesia Complications:        No immediate complications. Procedure:            Pre-Anesthesia Assessment:                       - After reviewing the risks and benefits, the patient                        was deemed in satisfactory condition to undergo the                        procedure.                       After obtaining informed consent, the colonoscope was                        passed under direct vision. Throughout the procedure,                        the patient's blood pressure, pulse, and oxygen                        saturations were monitored continuously. The                        Colonoscope was introduced through the anus and                        advanced to the the cecum, identified by appendiceal                        orifice and ileocecal valve. The colonoscopy was                        somewhat difficult due to restricted mobility of the                        colon, a redundant colon, significant looping and a                        tortuous colon. The patient tolerated the procedure                        well. The quality of the bowel preparation was                        excellent. Findings:      Multiple small-mouthed diverticula were found in the sigmoid colon.      A 12-15 mm polyp was  found in the anus. The polyp was sessile. Colan Neptune came to see the polyp/mass and recommended removal in the near       future.      The exam was otherwise without  abnormality. Impression:           - Diverticulosis in the sigmoid colon.                       - One 15 mm polyp at the anus.                       - The examination was otherwise normal.                       - No specimens collected. Recommendation:       - The findings and recommendations were discussed with                        the patient's family. [name], MD 11/29/2017 9:28:57 AM This report has been signed electronically. Number of Addenda: 0 Note Initiated On: 11/29/2017 8:45 AM Scope Withdrawal Time: 0 hours 11 minutes 56 seconds  Total Procedure Duration: 0 hours 21 minutes 28 seconds       [facility]

## 2017-11-29 NOTE — Anesthesia Preprocedure Evaluation (Signed)
Anesthesia Evaluation  Patient identified by MRN, date of birth, ID band Patient awake    Reviewed: Allergy & Precautions, H&P , NPO status , Patient's Chart, lab work & pertinent test results  Airway Mallampati: III  TM Distance: >3 FB Neck ROM: limited    Dental  (+) Chipped, Poor Dentition   Pulmonary neg pulmonary ROS, neg shortness of breath,           Cardiovascular Exercise Tolerance: Good hypertension, (-) angina+ CAD, + Past MI and + CABG  (-) DOE      Neuro/Psych PSYCHIATRIC DISORDERS Anxiety Depression TIA Neuromuscular disease CVA, Residual Symptoms    GI/Hepatic negative GI ROS, Neg liver ROS, GERD  Medicated and Controlled,  Endo/Other  diabetes, Type 2Hypothyroidism   Renal/GU negative Renal ROS  negative genitourinary   Musculoskeletal   Abdominal   Peds  Hematology negative hematology ROS (+)   Anesthesia Other Findings Past Medical History: No date: Anxiety No date: Broken arm     Comment:  left FA 3/17 No date: Broken arm     Comment:  left No date: CAD (coronary artery disease)     Comment:  s/p MI No date: Cancer [facility])     Comment:  uterine No date: Cervical disc disorder No date: Depression No date: Diabetes mellitus without complication (HCC) No date: Hyperlipidemia No date: Hypertension No date: Hypothyroid No date: Myocardial infarction ([place]) 06/04/2014: Neck pain No date: Parkinson's disease ([place]) No date: Spinal stenosis  Past Surgical History: No date: ABDOMINAL HYSTERECTOMY No date: COLONOSCOPY No date: CORONARY ARTERY BYPASS GRAFT No date: CORONARY STENT PLACEMENT 04/18/2017: ESOPHAGOGASTRODUODENOSCOPY (EGD) WITH PROPOFOL; N/A     Comment:  Procedure: ESOPHAGOGASTRODUODENOSCOPY (EGD) WITH               PROPOFOL;  Surgeon: [name], MD;  Location: [facility]               ENDOSCOPY;  Service: Endoscopy;  Laterality: N/A; No date: s/p pacer insertion  BMI    Body Mass  Index:  25.84 kg/m      Reproductive/Obstetrics negative OB ROS                             Anesthesia Physical Anesthesia Plan  ASA: IV  Anesthesia Plan: General   Post-op Pain Management:    Induction: Intravenous  PONV Risk Score and Plan: Propofol infusion and TIVA  Airway Management Planned: Natural Airway and Nasal Cannula  Additional Equipment:   Intra-op Plan:   Post-operative Plan:   Informed Consent: I have reviewed the patients History and Physical, chart, labs and discussed the procedure including the risks, benefits and alternatives for the proposed anesthesia with the patient or authorized representative who has indicated his/her understanding and acceptance.   Dental Advisory Given  Plan Discussed with: Anesthesiologist, CRNA and Surgeon  Anesthesia Plan Comments: (Patient consented for risks of anesthesia including but not limited to:  - adverse reactions to medications - risk of intubation if required - damage to teeth, lips or other oral mucosa - sore throat or hoarseness - Damage to heart, brain, lungs or loss of life  Patient voiced understanding.)        Anesthesia Quick Evaluation

## 2017-11-29 NOTE — H&P (Signed)
Primary Care Physician:  Lorelee Market, MD Primary Gastroenterologist:  Dr. Vira Agar  Pre-Procedure History & Physical: HPI:  Kristi [name] is a 77 y.o. female is here for an endoscopy and colonoscopy.  These done for GERD and abdominal bloating.   Past Medical History:  Diagnosis Date  . Anxiety   . Broken arm    left FA 3/17  . Broken arm    left  . CAD (coronary artery disease)    s/p MI  . Cancer ([place])    uterine  . Cervical disc disorder   . Depression   . Diabetes mellitus without complication ([place])   . Hyperlipidemia   . Hypertension   . Hypothyroid   . Myocardial infarction ([place])   . Neck pain 06/04/2014  . Parkinson's disease ([place])   . Spinal stenosis     Past Surgical History:  Procedure Laterality Date  . ABDOMINAL HYSTERECTOMY    . COLONOSCOPY    . CORONARY ARTERY BYPASS GRAFT    . CORONARY STENT PLACEMENT    . ESOPHAGOGASTRODUODENOSCOPY (EGD) WITH PROPOFOL N/A 04/18/2017   Procedure: ESOPHAGOGASTRODUODENOSCOPY (EGD) WITH PROPOFOL;  Surgeon: [name], MD;  Location: [facility] ENDOSCOPY;  Service: Endoscopy;  Laterality: N/A;  . s/p pacer insertion      Prior to Admission medications   Medication Sig Start Date End Date Taking? Authorizing Provider  carvedilol (COREG) 3.125 MG tablet Take 1 tablet (3.125 mg total) by mouth 2 (two) times daily with a meal. 04/18/17  Yes Gladstone Lighter, MD  cholecalciferol (VITAMIN D) 1000 UNITS tablet Take 1,000 Units by mouth daily. Reported on 07/08/2015   Yes [provider]  fluticasone (FLONASE) 50 MCG/ACT nasal spray Place 2 sprays into both nostrils daily.   Yes [provider]  vitamin B-12 (CYANOCOBALAMIN) 500 MCG tablet Take 500 mcg by mouth daily.    Yes [provider]  albuterol (PROVENTIL HFA;VENTOLIN HFA) 108 (90 Base) MCG/ACT inhaler Inhale 2 puffs into the lungs every 6 (six) hours as needed. Patient not taking: Reported on 07/10/2017 04/27/17   Orbie Pyo, MD   aspirin EC 81 MG tablet Take 81 mg by mouth daily.    [provider]  carbidopa-levodopa (SINEMET IR) 25-100 MG tablet Take by mouth. 03/08/17   [provider]  clonazePAM (KLONOPIN) 0.5 MG tablet Take 0.5 mg by mouth 2 (two) times daily.    [provider]  clopidogrel (PLAVIX) 75 MG tablet  11/17/16   [provider]  docusate sodium (COLACE) 100 MG capsule Take 100 mg by mouth 2 (two) times daily.    [provider]  furosemide (LASIX) 40 MG tablet  05/29/17   [provider]  gabapentin (NEURONTIN) 300 MG capsule Take 30 mg by mouth at bedtime. 04/24/17   [provider]  glimepiride (AMARYL) 1 MG tablet Take 1 tablet (1 mg total) by mouth daily with breakfast. 04/19/17   Gladstone Lighter, MD  levothyroxine (SYNTHROID, LEVOTHROID) 75 MCG tablet  03/23/17   [provider]  Linaclotide (LINZESS) 145 MCG CAPS capsule Take 145 mcg by mouth daily.    [provider]  loratadine (CLARITIN) 10 MG tablet Take 10 mg by mouth daily.    [provider]  metFORMIN (GLUCOPHAGE) 500 MG tablet Take 1 tablet (500 mg total) by mouth 2 (two) times daily with a meal. Patient not taking: Reported on 07/10/2017 04/18/17   Gladstone Lighter, MD  montelukast (SINGULAIR) 10 MG tablet  09/04/17   [provider]  Multiple Vitamins-Minerals (ALIVE WOMENS ENERGY PO) Take 1 tablet by mouth daily.    [provider]  nitroGLYCERIN (NITROSTAT) 0.4 MG SL tablet Place 0.4 mg under the tongue every 5 (five) minutes as needed for chest pain.  10/03/16   [provider]  olopatadine (PATANOL) 0.1 % ophthalmic solution 1 drop 2 (two) times daily.    [provider]  omeprazole (PRILOSEC) 20 MG capsule Take 20 mg by mouth daily.    [provider]  oxyCODONE-acetaminophen (PERCOCET/ROXICET) 5-325 MG tablet Take 1 tablet by mouth every 6 (six) hours as needed for severe pain.    [provider]  pioglitazone (ACTOS) 30 MG tablet pioglitazone 30 mg tablet    [provider]  potassium chloride SA (K-DUR,KLOR-CON) 20 MEQ tablet  06/29/17   [provider]  predniSONE (DELTASONE) 20 MG tablet Take 2 tablets (40 mg total) by mouth daily. Patient not taking: Reported on 07/10/2017 04/27/17 04/27/18  Orbie Pyo, MD  rosuvastatin (CRESTOR) 20 MG tablet Take 20 mg by mouth daily.    [provider]  sertraline (ZOLOFT) 100 MG tablet Take 50-100 mg by mouth 2 (two) times daily. Take 100 mg in the am and 50 mg at bedtime.    [provider]  thiothixene (NAVANE) 5 MG capsule Take 5 mg by mouth 2 (two) times daily. 08/09/16   [provider]  traMADol-acetaminophen (ULTRACET) 37.5-325 MG tablet Take 1 tablet by mouth every 6 (six) hours as needed for pain.    [provider]  traZODone (DESYREL) 50 MG tablet Take 50 mg by mouth at bedtime as needed for sleep.     [provider]    Allergies as of 09/15/2017 - Review Complete 09/05/2017  Allergen Reaction Noted  . Penicillins Anaphylaxis 08/21/2013  . Penicillin g Swelling and Rash 07/03/2014    Family History  Problem Relation Age of Onset  . Cancer Mother   . Depression Mother   . Diabetes Mother   . Hypertension Mother   . Heart disease Father     Social History   Socioeconomic History  . Marital status: Widowed    Spouse name: Not on file  . Number of children: Not on file  . Years of education: Not on file  . Highest education level: Not on file  Occupational History  . Occupation: retired  Scientific laboratory technician  . Financial resource strain: Not on file  . Food insecurity:    Worry: Not on file    Inability: Not on file  . Transportation needs:    Medical: Not on file    Non-medical: Not on file  Tobacco Use  . Smoking status: Never Smoker  . Smokeless tobacco: Never Used  Substance and Sexual Activity  . Alcohol use: No  . Drug use: No  . Sexual  activity: Never  Lifestyle  . Physical activity:    Days per week: Not on file    Minutes per session: Not on file  . Stress: Not on file  Relationships  . Social connections:    Talks on phone: Not on file    Gets together: Not on file    Attends religious service: Not on file    Active member of club or organization: Not on file    Attends meetings of clubs or organizations: Not on file    Relationship status: Not on file  . Intimate partner violence:    Fear of current or  ex partner: Not on file    Emotionally abused: Not on file    Physically abused: Not on file    Forced sexual activity: Not on file  Other Topics Concern  . Not on file  Social History Narrative  . Not on file    Review of Systems: See HPI, otherwise negative ROS  Physical Exam: BP (!) 137/96   Pulse 84   Temp (!) 96.8 F (36 C) (Tympanic)   Resp 18   Ht 5\' 7"  (1.702 m)   Wt 74.8 kg   SpO2 100%   BMI 25.84 kg/m  General:   Alert,  pleasant and cooperative in NAD Head:  Normocephalic and atraumatic. Neck:  Supple; no masses or thyromegaly. Lungs:  Clear throughout to auscultation.    Heart:  Regular rate and rhythm. Abdomen:  Soft, nontender and nondistended. Normal bowel sounds, without guarding, and without rebound.   Neurologic:  Alert and  oriented x4;  grossly normal neurologically.  Impression/Plan: Byars is here for an endoscopy and colonoscopy to be performed for [facility] and abdominal bloating.  Risks, benefits, limitations, and alternatives regarding  endoscopy and colonoscopy have been reviewed with the patient.  Questions have been answered.  All parties agreeable.   Gaylyn Cheers, MD  11/29/2017, 8:36 AM

## 2017-11-29 NOTE — Op Note (Signed)
[facility] Patient Name: Kristi [name] Procedure Date: 11/29/2017 8:46 AM MRN: [id number] Account #: [id number] Date of Birth: Aug 17, 1940 Admit Type: Outpatient Age: 77 Room: [facility] ENDO ROOM 3 Gender: Female Note Status: Finalized Procedure:            Upper GI endoscopy Indications:          Generalized abdominal pain, Abdominal bloating Providers:            [name], MD Referring MD:         Lorelee Market (Referring MD) Medicines:            Propofol per Anesthesia Complications:        No immediate complications. Procedure:            Pre-Anesthesia Assessment:                       - After reviewing the risks and benefits, the patient                        was deemed in satisfactory condition to undergo the                        procedure.                       After obtaining informed consent, the endoscope was                        passed under direct vision. Throughout the procedure,                        the patient's blood pressure, pulse, and oxygen                        saturations were monitored continuously. The Endoscope                        was introduced through the mouth, and advanced to the                        second part of duodenum. The upper GI endoscopy was                        accomplished without difficulty. The patient tolerated                        the procedure well. Findings:      The examined esophagus was normal. GEJ 38cm.      Patchy mildly erythematous mucosa without bleeding was found on the       anterior wall of the gastric antrum. Biopsies were taken with a cold       forceps for histology. Biopsies were taken with a cold forceps for       Helicobacter pylori testing.      The examined duodenum was normal.      A small hiatal hernia was present. Impression:           - Normal esophagus.                       - Erythematous mucosa in the anterior wall of the  gastric antrum. Biopsied.                       - Normal examined duodenum. Recommendation:       - Await pathology results.                       - Perform a colonoscopy as previously scheduled. [name], MD 11/29/2017 8:59:15 AM This report has been signed electronically. Number of Addenda: 0 Note Initiated On: 11/29/2017 8:46 AM      [facility]

## 2017-12-01 LAB — SURGICAL PATHOLOGY

## 2017-12-06 DIAGNOSIS — M79605 Pain in left leg: Secondary | ICD-10-CM | POA: Insufficient documentation

## 2017-12-06 DIAGNOSIS — M79604 Pain in right leg: Secondary | ICD-10-CM | POA: Insufficient documentation

## 2017-12-20 ENCOUNTER — Telehealth (INDEPENDENT_AMBULATORY_CARE_PROVIDER_SITE_OTHER): Payer: Self-pay

## 2018-01-04 ENCOUNTER — Ambulatory Visit (INDEPENDENT_AMBULATORY_CARE_PROVIDER_SITE_OTHER): Payer: Medicare Other | Admitting: General Surgery

## 2018-01-04 ENCOUNTER — Other Ambulatory Visit: Payer: Self-pay

## 2018-01-04 ENCOUNTER — Encounter: Payer: Self-pay | Admitting: General Surgery

## 2018-01-04 VITALS — BP 103/68 | HR 62 | Temp 97.7°F | Resp 13 | Ht 67.0 in | Wt 176.8 lb

## 2018-01-04 DIAGNOSIS — K621 Rectal polyp: Secondary | ICD-10-CM

## 2018-01-04 DIAGNOSIS — I25118 Atherosclerotic heart disease of native coronary artery with other forms of angina pectoris: Secondary | ICD-10-CM

## 2018-01-04 NOTE — Progress Notes (Signed)
Patient ID: Kristi [name], female   DOB: 04-May-1940, 77 y.o.   MRN: [id number]  Chief Complaint  Patient presents with  . New Patient (Initial Visit)    new pt ref Dr.Elliott eval for rectal mass Colonoscopy [facility] needs removed     Kristi [name] is a 77 y.o. female here today she is a new patient referred by Dr.Elliott to be evaluated for rectal mass patient states she feels well. Patient states she had a colonoscopy and that is when the mass was discovered.    HPI  Past Medical History:  Diagnosis Date  . Anxiety   . Broken arm    left FA 3/17  . Broken arm    left  . CAD (coronary artery disease)    s/p MI  . Cancer ([place])    uterine  . Cervical disc disorder   . Depression   . Diabetes mellitus without complication ([place])   . Hyperlipidemia   . Hypertension   . Hypothyroid   . Myocardial infarction ([place])   . Neck pain 06/04/2014  . Parkinson's disease ([place])   . Spinal stenosis     Past Surgical History:  Procedure Laterality Date  . ABDOMINAL HYSTERECTOMY    . COLONOSCOPY    . COLONOSCOPY WITH PROPOFOL N/A 11/29/2017   Procedure: COLONOSCOPY WITH PROPOFOL;  Surgeon: [name], MD;  Location: [facility] ENDOSCOPY;  Service: Endoscopy;  Laterality: N/A;  . CORONARY ARTERY BYPASS GRAFT    . CORONARY STENT PLACEMENT    . ESOPHAGOGASTRODUODENOSCOPY (EGD) WITH PROPOFOL N/A 04/18/2017   Procedure: ESOPHAGOGASTRODUODENOSCOPY (EGD) WITH PROPOFOL;  Surgeon: [name], MD;  Location: [facility] ENDOSCOPY;  Service: Endoscopy;  Laterality: N/A;  . ESOPHAGOGASTRODUODENOSCOPY (EGD) WITH PROPOFOL N/A 11/29/2017   Procedure: ESOPHAGOGASTRODUODENOSCOPY (EGD) WITH PROPOFOL;  Surgeon: [name], MD;  Location: [facility] ENDOSCOPY;  Service: Endoscopy;  Laterality: N/A;  . s/p pacer insertion      Family History  Problem Relation Age of Onset  . Cancer Mother   . Depression Mother   . Diabetes Mother   . Hypertension Mother   . Heart disease Father     Social History Social  History   Tobacco Use  . Smoking status: Never Smoker  . Smokeless tobacco: Never Used  Substance Use Topics  . Alcohol use: No  . Drug use: No    Allergies  Allergen Reactions  . Penicillins Anaphylaxis  . Penicillin G Swelling and Rash    Has patient had a PCN reaction causing immediate rash, facial/tongue/throat swelling, SOB or lightheadedness with hypotension: Yes Has patient had a PCN reaction causing severe rash involving mucus membranes or skin necrosis: No Has patient had a PCN reaction that required hospitalization: No Has patient had a PCN reaction occurring within the last 10 years: No If all of the above answers are "NO", then may proceed with Cephalosporin use.     Current Outpatient Medications  Medication Sig Dispense Refill  . aspirin EC 81 MG tablet Take 81 mg by mouth daily.    . carbidopa-levodopa (SINEMET IR) 25-100 MG tablet Take 1 tablet by mouth 3 (three) times daily.     . carvedilol (COREG) 3.125 MG tablet Take 1 tablet (3.125 mg total) by mouth 2 (two) times daily with a meal. 60 tablet 2  . docusate sodium (COLACE) 100 MG capsule Take 100 mg by mouth 2 (two) times daily.    . furosemide (LASIX) 40 MG tablet Take 40 mg by mouth daily.     [name]  gabapentin (NEURONTIN) 300 MG capsule Take 300-600 mg by mouth See admin instructions. Take 300 mg by mouth and 600 mg at bedtime    . glimepiride (AMARYL) 1 MG tablet Take 1 tablet (1 mg total) by mouth daily with breakfast. 30 tablet 2  . levothyroxine (SYNTHROID, LEVOTHROID) 100 MCG tablet Take 100 mcg by mouth daily before breakfast.     . Linaclotide (LINZESS) 145 MCG CAPS capsule Take 145 mcg by mouth daily.    [name] loratadine (CLARITIN) 10 MG tablet Take 10 mg by mouth daily.    . nitroGLYCERIN (NITROSTAT) 0.4 MG SL tablet Place 0.4 mg under the tongue every 5 (five) minutes as needed for chest pain.     [name] omeprazole (PRILOSEC) 20 MG capsule Take 20 mg by mouth 2 (two) times daily before a meal.     . potassium  chloride SA (K-DUR,KLOR-CON) 20 MEQ tablet Take 20 mEq by mouth 2 (two) times daily.     . rosuvastatin (CRESTOR) 20 MG tablet Take 20 mg by mouth daily.    . sertraline (ZOLOFT) 100 MG tablet Take 50-100 mg by mouth See admin instructions. Take 100 mg in the am and 50 mg at bedtime.    . hydrOXYzine (ATARAX/VISTARIL) 25 MG tablet Take 25 mg by mouth daily.    . sucralfate (CARAFATE) 1 g tablet Take 1 g by mouth 3 (three) times daily.     No current facility-administered medications for this visit.     Review of Systems Review of Systems  Constitutional: Negative.   Respiratory: Negative.   Cardiovascular: Negative.     Blood pressure 103/68, pulse 62, temperature 97.7 F (36.5 C), temperature source Temporal, resp. rate 13, height 5\' 7"  (1.702 m), weight 176 lb 12.8 oz (80.2 kg), SpO2 94 %.  Physical Exam Physical Exam  Constitutional: She is oriented to person, place, and time. She appears well-developed and well-nourished.  Eyes: Conjunctivae are normal. No scleral icterus.  Cardiovascular: Normal rate, regular rhythm and normal heart sounds.  Pulmonary/Chest: Effort normal and breath sounds normal.  Lymphadenopathy:    She has no cervical adenopathy.  Neurological: She is alert and oriented to person, place, and time.  Skin: Skin is warm and dry.   Rt anterior rectal mass Data Reviewed Intraoperative assessment with Dr. Vira Agar during November 29, 2017 colonoscopy.  Anoscopy shows the area of question to be on the anterior rectal wall to the right of the midline.  This is about 1 x 3 cm and begins at the dentate line.  No other rectal masses noted during the anoscopy.  Normal sphincter tone.  No evidence of perirectal extension.  Assessment    Low rectal polyp.    Plan Indications for excision reviewed.  This will be completed under anesthesia. Patient needs to be scheduled for surgery with Dr.Angella Montas for rectal mass. HPI, Physical Exam, Assessment and Plan have  been scribed under the direction and in the presence of Hervey Ard, Md.  Eudelia Bunch R. Bobette Mo, CMA   Forest Gleason Inaara Tye 01/06/2018, 9:55 AM  Patient's surgery has been scheduled for 01-17-18 at [facility] with Dr. Bary Castilla.  No prep needed per Dr. Bary Castilla.  It is okay for patient to continue an 81 mg aspirin once daily.   The patient is aware to call the office should they have further questions.   Dominga Ferry, CMA

## 2018-01-04 NOTE — Patient Instructions (Signed)
Patient needs to be scheduled for surgery with Dr.Byrnett for rectal mass.

## 2018-01-05 DIAGNOSIS — K6289 Other specified diseases of anus and rectum: Secondary | ICD-10-CM | POA: Insufficient documentation

## 2018-01-10 ENCOUNTER — Telehealth (INDEPENDENT_AMBULATORY_CARE_PROVIDER_SITE_OTHER): Payer: Self-pay

## 2018-01-10 NOTE — Telephone Encounter (Signed)
Indefinitely

## 2018-01-11 ENCOUNTER — Other Ambulatory Visit: Payer: Self-pay

## 2018-01-11 ENCOUNTER — Encounter
Admission: RE | Admit: 2018-01-11 | Discharge: 2018-01-11 | Disposition: A | Discharge status: Home / Self Care | Payer: Medicare Other | Source: Ambulatory Visit | Attending: General Surgery | Admitting: General Surgery

## 2018-01-11 DIAGNOSIS — I25110 Atherosclerotic heart disease of native coronary artery with unstable angina pectoris: Secondary | ICD-10-CM

## 2018-01-11 DIAGNOSIS — I2510 Atherosclerotic heart disease of native coronary artery without angina pectoris: Secondary | ICD-10-CM | POA: Diagnosis not present

## 2018-01-11 DIAGNOSIS — Z950 Presence of cardiac pacemaker: Secondary | ICD-10-CM | POA: Insufficient documentation

## 2018-01-11 DIAGNOSIS — Z01818 Encounter for other preprocedural examination: Secondary | ICD-10-CM | POA: Insufficient documentation

## 2018-01-11 DIAGNOSIS — K621 Rectal polyp: Secondary | ICD-10-CM | POA: Diagnosis not present

## 2018-01-11 HISTORY — DX: Headache, unspecified: R51.9

## 2018-01-11 HISTORY — DX: Gastro-esophageal reflux disease without esophagitis: K21.9

## 2018-01-11 HISTORY — DX: Headache: R51

## 2018-01-11 HISTORY — DX: Unspecified osteoarthritis, unspecified site: M19.90

## 2018-01-11 HISTORY — DX: Presence of cardiac pacemaker: Z95.0

## 2018-01-11 LAB — CBC
HCT: 39.1 % (ref 36.0–46.0)
Hemoglobin: 12.5 g/dL (ref 12.0–15.0)
MCH: 30.6 pg (ref 26.0–34.0)
MCHC: 32 g/dL (ref 30.0–36.0)
MCV: 95.6 fL (ref 80.0–100.0)
Platelets: 150 10*3/uL (ref 150–400)
RBC: 4.09 MIL/uL (ref 3.87–5.11)
RDW: 13.9 % (ref 11.5–15.5)
WBC: 6.3 10*3/uL (ref 4.0–10.5)
nRBC: 0 % (ref 0.0–0.2)

## 2018-01-11 LAB — BASIC METABOLIC PANEL
Anion gap: 8 (ref 5–15)
BUN: 19 mg/dL (ref 8–23)
CO2: 28 mmol/L (ref 22–32)
Calcium: 9.3 mg/dL (ref 8.9–10.3)
Chloride: 106 mmol/L (ref 98–111)
Creatinine, Ser: 0.76 mg/dL (ref 0.44–1.00)
GFR calc Af Amer: >60 mL/min (ref >60)
GFR calc non Af Amer: >60 mL/min (ref >60)
Glucose, Bld: 132 mg/dL — ABNORMAL HIGH (ref 70–99)
Potassium: 3.9 mmol/L (ref 3.5–5.1)
Sodium: 142 mmol/L (ref 135–145)

## 2018-01-11 NOTE — Patient Instructions (Signed)
Your procedure is scheduled on: 01/17/18 Report to Day Surgery. [facility] SECOND FLOOR To find out your arrival time please call [phone] between 1PM - 3PM on 01/16/18.  Remember: Instructions that are not followed completely may result in serious medical risk,  up to and including death, or upon the discretion of your surgeon and anesthesiologist your  surgery may need to be rescheduled.     _X__ 1. Do not eat food after midnight the night before your procedure.                 No gum chewing or hard candies. You may drink clear liquids up to 2 hours                 before you are scheduled to arrive for your surgery- DO not drink clear                 liquids within 2 hours of the start of your surgery.                 Clear Liquids include:  water, apple juice without pulp, clear carbohydrate                 drink such as Clearfast of Gatorade, Black Coffee or Tea (Do not add                 anything to coffee or tea).  __X__2.  On the morning of surgery brush your teeth with toothpaste and water, you                may rinse your mouth with mouthwash if you wish.  Do not swallow any toothpaste of mouthwash.     _X__ 3.  No Alcohol for 24 hours before or after surgery.   _X__ 4.  Do Not Smoke or use e-cigarettes For 24 Hours Prior to Your Surgery.                 Do not use any chewable tobacco products for at least 6 hours prior to                 surgery.  ____  5.  Bring all medications with you on the day of surgery if instructed.   _X___  6.  Notify your doctor if there is any change in your medical condition      (cold, fever, infections).     Do not wear jewelry, make-up, hairpins, clips or nail polish. Do not wear lotions, powders, or perfumes. You may wear deodorant. Do not shave 48 hours prior to surgery. Men may shave face and neck. Do not bring valuables to the hospital.    [facility] is not responsible for any belongings or  valuables.  Contacts, dentures or bridgework may not be worn into surgery. Leave your suitcase in the car. After surgery it may be brought to your room. For patients admitted to the hospital, discharge time is determined by your treatment team.   Patients discharged the day of surgery will not be allowed to drive home.   Please read over the following fact sheets that you were given:   Surgical Site Infection Prevention   __X__ Take these medicines the morning of surgery with A SIP OF WATER:    1. CARBIDOPA  2. CARVEDILOL  3. LEVOTHYROXINE  4. OMEPRAZOLE  5. ROSUVASTIN  6.SERTRALINE  ____ Fleet Enema (as directed)   __X__ Use CHG Soap as directed  ____ Use inhalers on the day of surgery  ____ Stop metformin 2 days prior to surgery    ____ Take 1/2 of usual insulin dose the night before surgery. No insulin the morning          of surgery.   ____ Stop Coumadin/Plavix/aspirin on  ____ Stop Anti-inflammatories on    ____ Stop supplements until after surgery.    ____ Bring C-Pap to the hospital.

## 2018-01-11 NOTE — Pre-Procedure Instructions (Signed)
FAXED AND SPOKE WITH DR MASOUD'S OFFICE RE EKG/ PACEMAKER DEVICE PROGRAMMING SHEET

## 2018-01-16 NOTE — Pre-Procedure Instructions (Signed)
PACEMAKER FORM ON CHART

## 2018-01-17 ENCOUNTER — Encounter
Admission: RE | Disposition: A | Discharge status: Home / Self Care | Payer: Self-pay | Source: Ambulatory Visit | Attending: General Surgery

## 2018-01-17 ENCOUNTER — Ambulatory Visit
Admission: RE | Admit: 2018-01-17 | Discharge: 2018-01-17 | Disposition: A | Discharge status: Home / Self Care | Payer: Medicare Other | Source: Ambulatory Visit | Attending: General Surgery | Admitting: General Surgery

## 2018-01-17 ENCOUNTER — Other Ambulatory Visit: Payer: Self-pay

## 2018-01-17 ENCOUNTER — Ambulatory Visit: Payer: Medicare Other

## 2018-01-17 DIAGNOSIS — Z7984 Long term (current) use of oral hypoglycemic drugs: Secondary | ICD-10-CM | POA: Diagnosis not present

## 2018-01-17 DIAGNOSIS — Z8541 Personal history of malignant neoplasm of cervix uteri: Secondary | ICD-10-CM | POA: Diagnosis not present

## 2018-01-17 DIAGNOSIS — Z950 Presence of cardiac pacemaker: Secondary | ICD-10-CM | POA: Insufficient documentation

## 2018-01-17 DIAGNOSIS — E039 Hypothyroidism, unspecified: Secondary | ICD-10-CM | POA: Diagnosis not present

## 2018-01-17 DIAGNOSIS — G20 Parkinson's disease: Secondary | ICD-10-CM | POA: Diagnosis not present

## 2018-01-17 DIAGNOSIS — C20 Malignant neoplasm of rectum: Secondary | ICD-10-CM | POA: Diagnosis not present

## 2018-01-17 DIAGNOSIS — Z90710 Acquired absence of both cervix and uterus: Secondary | ICD-10-CM | POA: Insufficient documentation

## 2018-01-17 DIAGNOSIS — Z79899 Other long term (current) drug therapy: Secondary | ICD-10-CM | POA: Insufficient documentation

## 2018-01-17 DIAGNOSIS — I10 Essential (primary) hypertension: Secondary | ICD-10-CM | POA: Insufficient documentation

## 2018-01-17 DIAGNOSIS — Z955 Presence of coronary angioplasty implant and graft: Secondary | ICD-10-CM | POA: Insufficient documentation

## 2018-01-17 DIAGNOSIS — E785 Hyperlipidemia, unspecified: Secondary | ICD-10-CM | POA: Diagnosis not present

## 2018-01-17 DIAGNOSIS — I252 Old myocardial infarction: Secondary | ICD-10-CM | POA: Insufficient documentation

## 2018-01-17 DIAGNOSIS — I2510 Atherosclerotic heart disease of native coronary artery without angina pectoris: Secondary | ICD-10-CM | POA: Diagnosis not present

## 2018-01-17 DIAGNOSIS — R51 Headache: Secondary | ICD-10-CM | POA: Diagnosis not present

## 2018-01-17 DIAGNOSIS — M1990 Unspecified osteoarthritis, unspecified site: Secondary | ICD-10-CM | POA: Diagnosis not present

## 2018-01-17 DIAGNOSIS — Z7982 Long term (current) use of aspirin: Secondary | ICD-10-CM | POA: Diagnosis not present

## 2018-01-17 DIAGNOSIS — Z951 Presence of aortocoronary bypass graft: Secondary | ICD-10-CM | POA: Insufficient documentation

## 2018-01-17 DIAGNOSIS — E119 Type 2 diabetes mellitus without complications: Secondary | ICD-10-CM | POA: Insufficient documentation

## 2018-01-17 DIAGNOSIS — Z880 Allergy status to penicillin: Secondary | ICD-10-CM | POA: Diagnosis not present

## 2018-01-17 DIAGNOSIS — M542 Cervicalgia: Secondary | ICD-10-CM | POA: Diagnosis not present

## 2018-01-17 DIAGNOSIS — K621 Rectal polyp: Secondary | ICD-10-CM

## 2018-01-17 DIAGNOSIS — F329 Major depressive disorder, single episode, unspecified: Secondary | ICD-10-CM | POA: Insufficient documentation

## 2018-01-17 DIAGNOSIS — F419 Anxiety disorder, unspecified: Secondary | ICD-10-CM | POA: Diagnosis not present

## 2018-01-17 DIAGNOSIS — K219 Gastro-esophageal reflux disease without esophagitis: Secondary | ICD-10-CM | POA: Diagnosis not present

## 2018-01-17 HISTORY — PX: TRANSANAL EXCISION OF RECTAL MASS: SHX6134

## 2018-01-17 LAB — GLUCOSE, CAPILLARY
Glucose-Capillary: 101 mg/dL — ABNORMAL HIGH (ref 70–99)
Glucose-Capillary: 96 mg/dL (ref 70–99)

## 2018-01-17 SURGERY — EXCISION, MASS, RECTUM, ANAL APPROACH
Anesthesia: General

## 2018-01-17 MED ORDER — LIDOCAINE HCL (PF) 2 % IJ SOLN
INTRAMUSCULAR | Status: AC
  Filled 2018-01-17: qty 10

## 2018-01-17 MED ORDER — OXYCODONE HCL 5 MG/5ML PO SOLN: 5.0000 mg | Freq: Once | ORAL | Status: DC | PRN

## 2018-01-17 MED ORDER — ROCURONIUM BROMIDE 50 MG/5ML IV SOLN
INTRAVENOUS | Status: AC
  Filled 2018-01-17: qty 1

## 2018-01-17 MED ORDER — MIDAZOLAM HCL 2 MG/2ML IJ SOLN
INTRAMUSCULAR | Status: AC
  Filled 2018-01-17: qty 2

## 2018-01-17 MED ORDER — PROPOFOL 10 MG/ML IV BOLUS
INTRAVENOUS | Status: AC
  Filled 2018-01-17: qty 20

## 2018-01-17 MED ORDER — DEXAMETHASONE SODIUM PHOSPHATE 10 MG/ML IJ SOLN
INTRAMUSCULAR | Status: AC
  Filled 2018-01-17: qty 1

## 2018-01-17 MED ORDER — MIDAZOLAM HCL 2 MG/2ML IJ SOLN
INTRAMUSCULAR | Status: DC | PRN
  Administered 2018-01-17: 1 mg via INTRAVENOUS

## 2018-01-17 MED ORDER — BUPIVACAINE-EPINEPHRINE (PF) 0.5% -1:200000 IJ SOLN
INTRAMUSCULAR | Status: AC
  Filled 2018-01-17: qty 30

## 2018-01-17 MED ORDER — FENTANYL CITRATE (PF) 100 MCG/2ML IJ SOLN
INTRAMUSCULAR | Status: AC
  Filled 2018-01-17: qty 2

## 2018-01-17 MED ORDER — HYDROCODONE-ACETAMINOPHEN 5-325 MG PO TABS: 1.0000 | ORAL_TABLET | ORAL | 0 refills | Status: DC | PRN

## 2018-01-17 MED ORDER — PHENYLEPHRINE HCL 10 MG/ML IJ SOLN
INTRAMUSCULAR | Status: DC | PRN
  Administered 2018-01-17: 100 ug via INTRAVENOUS

## 2018-01-17 MED ORDER — OXYCODONE HCL 5 MG PO TABS: 5.0000 mg | ORAL_TABLET | Freq: Once | ORAL | Status: DC | PRN

## 2018-01-17 MED ORDER — LIDOCAINE HCL (CARDIAC) PF 100 MG/5ML IV SOSY
PREFILLED_SYRINGE | INTRAVENOUS | Status: DC | PRN
  Administered 2018-01-17: 80 mg via INTRAVENOUS

## 2018-01-17 MED ORDER — SUCCINYLCHOLINE CHLORIDE 20 MG/ML IJ SOLN
INTRAMUSCULAR | Status: AC
  Filled 2018-01-17: qty 1

## 2018-01-17 MED ORDER — FENTANYL CITRATE (PF) 100 MCG/2ML IJ SOLN
INTRAMUSCULAR | Status: DC | PRN
  Administered 2018-01-17: 50 ug via INTRAVENOUS

## 2018-01-17 MED ORDER — BUPIVACAINE-EPINEPHRINE 0.5% -1:200000 IJ SOLN
INTRAMUSCULAR | Status: DC | PRN
  Administered 2018-01-17: 30 mL

## 2018-01-17 MED ORDER — FENTANYL CITRATE (PF) 100 MCG/2ML IJ SOLN: 25.0000 ug | INTRAMUSCULAR | Status: DC | PRN

## 2018-01-17 MED ORDER — ONDANSETRON HCL 4 MG/2ML IJ SOLN
INTRAMUSCULAR | Status: DC | PRN
  Administered 2018-01-17: 4 mg via INTRAVENOUS

## 2018-01-17 MED ORDER — SODIUM CHLORIDE 0.9 % IV SOLN
INTRAVENOUS | Status: DC
  Administered 2018-01-17: 10:00:00 via INTRAVENOUS

## 2018-01-17 MED ORDER — DEXAMETHASONE SODIUM PHOSPHATE 10 MG/ML IJ SOLN
INTRAMUSCULAR | Status: DC | PRN
  Administered 2018-01-17: 8 mg via INTRAVENOUS

## 2018-01-17 MED ORDER — PROPOFOL 10 MG/ML IV BOLUS
INTRAVENOUS | Status: DC | PRN
  Administered 2018-01-17: 30 mg via INTRAVENOUS
  Administered 2018-01-17: 110 mg via INTRAVENOUS

## 2018-01-17 MED ORDER — ONDANSETRON HCL 4 MG/2ML IJ SOLN
INTRAMUSCULAR | Status: AC
  Filled 2018-01-17: qty 2

## 2018-01-17 MED ORDER — SEVOFLURANE IN SOLN
RESPIRATORY_TRACT | Status: AC
  Filled 2018-01-17: qty 250

## 2018-01-17 SURGICAL SUPPLY — 37 items
BLADE SURG 15 STRL LF DISP TIS (BLADE) ×1 IMPLANT
BLADE SURG 15 STRL SS (BLADE) ×1
BRIEF STRETCH MATERNITY 2XLG (MISCELLANEOUS) ×2 IMPLANT
CANISTER SUCT 1200ML W/VALVE (MISCELLANEOUS) ×2 IMPLANT
COVER WAND RF STERILE (DRAPES) ×2 IMPLANT
DRAPE LAPAROTOMY 100X77 ABD (DRAPES) ×2 IMPLANT
DRAPE LEGGINS SURG 28X43 STRL (DRAPES) ×2 IMPLANT
DRAPE UNDER BUTTOCK W/FLU (DRAPES) ×2 IMPLANT
DRSG GAUZE PETRO 6X36 STRIP ST (GAUZE/BANDAGES/DRESSINGS) ×2 IMPLANT
DRSG TELFA 3X8 NADH (GAUZE/BANDAGES/DRESSINGS) ×2 IMPLANT
ELECT REM PT RETURN 9FT ADLT (ELECTROSURGICAL) ×2
ELECTRODE REM PT RTRN 9FT ADLT (ELECTROSURGICAL) ×1 IMPLANT
FILTER PURE VAC YLPA (MISCELLANEOUS) IMPLANT
FLTR PURE VAC YLPA (MISCELLANEOUS)
GLOVE BIO SURGEON STRL SZ7.5 (GLOVE) ×8 IMPLANT
GLOVE INDICATOR 8.0 STRL GRN (GLOVE) ×2 IMPLANT
GOWN STRL REUS W/ TWL LRG LVL3 (GOWN DISPOSABLE) ×3 IMPLANT
GOWN STRL REUS W/TWL LRG LVL3 (GOWN DISPOSABLE) ×3
LABEL OR SOLS (LABEL) ×2 IMPLANT
NEEDLE HYPO 22GX1.5 SAFETY (NEEDLE) ×2 IMPLANT
NEEDLE HYPO 25X1 1.5 SAFETY (NEEDLE) IMPLANT
PACK BASIN MINOR ARMC (MISCELLANEOUS) ×2 IMPLANT
PAD OB MATERNITY 4.3X12.25 (PERSONAL CARE ITEMS) ×2 IMPLANT
PAD PREP 24X41 OB/GYN DISP (PERSONAL CARE ITEMS) ×2 IMPLANT
SHEARS FOC LG CVD HARMONIC 17C (MISCELLANEOUS) ×2 IMPLANT
STRAP SAFETY 5IN WIDE (MISCELLANEOUS) ×2 IMPLANT
SUCT SIGMOIDOSCOPE TIP 18 W/TU (SUCTIONS) IMPLANT
SURGILUBE 2OZ TUBE FLIPTOP (MISCELLANEOUS) ×2 IMPLANT
SUT CHROMIC 3 0 SH 27 (SUTURE) ×2 IMPLANT
SUT ETHILON 3-0 FS-10 30 BLK (SUTURE) ×2
SUT SILK 0 CT 1 30 (SUTURE) ×2 IMPLANT
SUT VIC AB 3-0 SH 27 (SUTURE) ×1
SUT VIC AB 3-0 SH 27X BRD (SUTURE) ×1 IMPLANT
SUTURE EHLN 3-0 FS-10 30 BLK (SUTURE) ×1 IMPLANT
SYR 10ML LL (SYRINGE) IMPLANT
SYR BULB IRRIG 60ML STRL (SYRINGE) ×2 IMPLANT
TUBING SMOKE EVAC 6FT (TUBING) IMPLANT

## 2018-01-17 NOTE — Anesthesia Procedure Notes (Signed)
Procedure Name: LMA Insertion Date/Time: 01/17/2018 11:25 AM Performed by: Jonna Clark, CRNA Pre-anesthesia Checklist: Patient identified, Patient being monitored, Timeout performed, Emergency Drugs available and Suction available Patient Re-evaluated:Patient Re-evaluated prior to induction Oxygen Delivery Method: Circle system utilized Preoxygenation: Pre-oxygenation with 100% oxygen Induction Type: IV induction Ventilation: Mask ventilation without difficulty LMA: LMA inserted LMA Size: 4.0 Tube type: Oral Number of attempts: 1 Placement Confirmation: positive ETCO2 and breath sounds checked- equal and bilateral Tube secured with: Tape Dental Injury: Teeth and Oropharynx as per pre-operative assessment

## 2018-01-17 NOTE — Anesthesia Preprocedure Evaluation (Addendum)
Anesthesia Evaluation  Patient identified by MRN, date of birth, ID band Patient awake    Reviewed: Allergy & Precautions, NPO status , Patient's Chart, lab work & pertinent test results  History of Anesthesia Complications Negative for: history of anesthetic complications  Airway Mallampati: III  TM Distance: >3 FB Neck ROM: Full    Dental no notable dental hx.    Pulmonary neg pulmonary ROS, neg sleep apnea, neg COPD,    breath sounds clear to auscultation- rhonchi (-) wheezing      Cardiovascular hypertension, + CAD and + Past MI  + pacemaker  Rhythm:Regular Rate:Normal - Systolic murmurs and - Diastolic murmurs    Neuro/Psych  Headaches, PSYCHIATRIC DISORDERS Anxiety Depression TIACVA    GI/Hepatic Neg liver ROS, GERD  ,  Endo/Other  diabetesHypothyroidism   Renal/GU negative Renal ROS     Musculoskeletal  (+) Arthritis ,   Abdominal (+) - obese,   Peds  Hematology negative hematology ROS (+)   Anesthesia Other Findings Past Medical History: No date: Anxiety No date: Arthritis No date: Broken arm     Comment:  left FA 3/17 No date: Broken arm     Comment:  left No date: CAD (coronary artery disease)     Comment:  s/p MI No date: Cancer ([place])     Comment:  uterine No date: Cervical disc disorder No date: Depression No date: Diabetes mellitus without complication ([facility]) No date: GERD (gastroesophageal reflux disease) No date: Headache No date: Hyperlipidemia No date: Hypertension No date: Hypothyroid No date: Myocardial infarction [facility])     Comment:  15 year ago 06/04/2014: Neck pain No date: Parkinson's disease ([place]) No date: Presence of permanent cardiac pacemaker No date: Spinal stenosis   Reproductive/Obstetrics                            Anesthesia Physical Anesthesia Plan  ASA: III  Anesthesia Plan: General   Post-op Pain Management:    Induction:  Intravenous  PONV Risk Score and Plan: 2 and Ondansetron and Dexamethasone  Airway Management Planned: LMA  Additional Equipment:   Intra-op Plan:   Post-operative Plan:   Informed Consent: I have reviewed the patients History and Physical, chart, labs and discussed the procedure including the risks, benefits and alternatives for the proposed anesthesia with the patient or authorized representative who has indicated his/her understanding and acceptance.   Dental advisory given  Plan Discussed with: CRNA and Anesthesiologist  Anesthesia Plan Comments:         Anesthesia Quick Evaluation

## 2018-01-17 NOTE — Transfer of Care (Signed)
Immediate Anesthesia Transfer of Care Note  [name]  Procedure(s) Performed: TRANSANAL EXCISION OF RECTAL POLYP (N/A )  Patient Location: PACU  Anesthesia Type:General  Level of Consciousness: drowsy  Airway & Oxygen Therapy: Patient Spontanous Breathing and Patient connected to face mask oxygen  Post-op Assessment: Report given to RN and Post -op Vital signs reviewed and stable  Post vital signs: Reviewed and stable  Last Vitals:  Vitals Value Taken Time  BP 150/90 01/17/2018 12:25 PM  Temp 37 C 01/17/2018 12:25 PM  Pulse 64 01/17/2018 12:25 PM  Resp 13 01/17/2018 12:25 PM  SpO2 100 % 01/17/2018 12:25 PM  Vitals shown include unvalidated device data.  Last Pain:  Vitals:   01/17/18 1225  TempSrc: Axillary  PainSc:          Complications: No apparent anesthesia complications

## 2018-01-17 NOTE — Progress Notes (Signed)
Rectal packing removed per surgeon's orders.  Pt tolerated well.

## 2018-01-17 NOTE — Anesthesia Post-op Follow-up Note (Signed)
Anesthesia QCDR form completed.        

## 2018-01-17 NOTE — Discharge Instructions (Signed)

## 2018-01-17 NOTE — Op Note (Signed)
Preoperative diagnosis: Low rectal polyp.  Postoperative diagnosis: Same.  Operative procedure: Transanal excision of rectal polyp.  Operating Surgeon: Hervey Ard, MD.  Anesthesia: General by LMA, Marcaine 0.5% with 1 to 200,000 units of epinephrine, 30 cc.  Estimated blood loss: Less than 2 cc.  Clinical note: 77 year old woman was undergoing a colonoscopy was found to have a polyp just above the dentate line on the right anterior lateral rectal wall.  She was felt to be a candidate for transanal excision.  Operative note: The patient tolerated general anesthesia well.  She was placed in dorsolithotomy position and the perineum cleansed with Betadine solution x2 and draped.  Marcaine 0.5% with 1 to 200,000 units of epinephrine was infiltrated around the anal opening and then additional local injected beneath the polyp on the anterior lateral rectal wall on the right side.  The area around the polyp which was fairly firm was marked with a cautery about 5 mm away from the visible edge.  The harmonic scalpel was then used beginning on the skin just below the dentate line and then extended around the polyp.  This was about 1 x 2 cm in diameter.  The polyp was elevated off the underlying circular muscle of the sphincter as well as the lower rectal musculature.  The specimen was sutured to a Telfa pad and orientated and sent in formalin for routine histology.  The defect was approximated with a running, locking 3-0 chromic suture.  A single 3-0 chromic additional suture was used for hemostasis at the superior edge.  A Vaseline gauze pack was placed for later removal in the recovery room.  Patient tolerated procedure well and was taken recovery in stable condition.

## 2018-01-17 NOTE — H&P (Signed)
Kristi [name] [id number] 10/18/1940     HPI:  77 year-old woman who recently underwent a colonoscopy and was identified with a polyp just above the dentate line.  She presents today for planned transanal excision.  She reports that except for headache and a little dizziness she feels well this morning.  Medications Prior to Admission  Medication Sig Dispense Refill Last Dose  . aspirin EC 81 MG tablet Take 81 mg by mouth daily.   01/16/2018 at Unknown time  . carbidopa-levodopa (SINEMET IR) 25-100 MG tablet Take 1 tablet by mouth 3 (three) times daily.    01/17/2018 at Unknown time  . carvedilol (COREG) 3.125 MG tablet Take 1 tablet (3.125 mg total) by mouth 2 (two) times daily with a meal. 60 tablet 2 01/17/2018 at 0630  . docusate sodium (COLACE) 100 MG capsule Take 100 mg by mouth 2 (two) times daily.   01/16/2018 at Unknown time  . furosemide (LASIX) 40 MG tablet Take 40 mg by mouth daily.    01/16/2018 at Unknown time  . gabapentin (NEURONTIN) 300 MG capsule Take 300-600 mg by mouth See admin instructions. Take 300 mg by mouth and 600 mg at bedtime   01/16/2018 at Unknown time  . glimepiride (AMARYL) 1 MG tablet Take 1 tablet (1 mg total) by mouth daily with breakfast. 30 tablet 2 01/16/2018 at Unknown time  . hydrOXYzine (ATARAX/VISTARIL) 25 MG tablet Take 25 mg by mouth daily.   01/16/2018 at Unknown time  . levothyroxine (SYNTHROID, LEVOTHROID) 100 MCG tablet Take 100 mcg by mouth daily before breakfast.    01/17/2018 at Unknown time  . Linaclotide (LINZESS) 145 MCG CAPS capsule Take 145 mcg by mouth daily.   01/16/2018 at Unknown time  . loratadine (CLARITIN) 10 MG tablet Take 10 mg by mouth daily.   01/16/2018 at Unknown time  . nitroGLYCERIN (NITROSTAT) 0.4 MG SL tablet Place 0.4 mg under the tongue every 5 (five) minutes as needed for chest pain.    Taking  . omeprazole (PRILOSEC) 20 MG capsule Take 20 mg by mouth 2 (two) times daily before a meal.    01/17/2018 at Unknown time  .  potassium chloride SA (K-DUR,KLOR-CON) 20 MEQ tablet Take 20 mEq by mouth 2 (two) times daily.    01/16/2018 at Unknown time  . rosuvastatin (CRESTOR) 20 MG tablet Take 20 mg by mouth daily.   01/16/2018 at Unknown time  . sertraline (ZOLOFT) 100 MG tablet Take 50-100 mg by mouth See admin instructions. Take 100 mg in the am and 50 mg at bedtime.   01/17/2018 at Unknown time  . sucralfate (CARAFATE) 1 g tablet Take 1 g by mouth 3 (three) times daily.   01/16/2018 at Unknown time   Allergies  Allergen Reactions  . Penicillins Anaphylaxis  . Penicillin G Swelling and Rash    Has patient had a PCN reaction causing immediate rash, facial/tongue/throat swelling, SOB or lightheadedness with hypotension: Yes Has patient had a PCN reaction causing severe rash involving mucus membranes or skin necrosis: No Has patient had a PCN reaction that required hospitalization: No Has patient had a PCN reaction occurring within the last 10 years: No If all of the above answers are "NO", then may proceed with Cephalosporin use.    Past Medical History:  Diagnosis Date  . Anxiety   . Arthritis   . Broken arm    left FA 3/17  . Broken arm    left  . CAD (coronary artery disease)  s/p MI  . Cancer ([place])    uterine  . Cervical disc disorder   . Depression   . Diabetes mellitus without complication ([place])   . GERD (gastroesophageal reflux disease)   . Headache   . Hyperlipidemia   . Hypertension   . Hypothyroid   . Myocardial infarction ([place])    15 year ago  . Neck pain 06/04/2014  . Parkinson's disease ([place])   . Presence of permanent cardiac pacemaker   . Spinal stenosis    Past Surgical History:  Procedure Laterality Date  . ABDOMINAL HYSTERECTOMY    . BREAST SURGERY    . COLONOSCOPY    . COLONOSCOPY WITH PROPOFOL N/A 11/29/2017   Procedure: COLONOSCOPY WITH PROPOFOL;  Surgeon: [name], MD;  Location[facility] ENDOSCOPY;  Service: Endoscopy;  Laterality: N/A;  . CORONARY ANGIOPLASTY      stent  . CORONARY ARTERY BYPASS GRAFT    . CORONARY STENT PLACEMENT    . ESOPHAGOGASTRODUODENOSCOPY (EGD) WITH PROPOFOL N/A 04/18/2017   Procedure: ESOPHAGOGASTRODUODENOSCOPY (EGD) WITH PROPOFOL;  Surgeon: [name], MD;  Location: [facility] ENDOSCOPY;  Service: Endoscopy;  Laterality: N/A;  . ESOPHAGOGASTRODUODENOSCOPY (EGD) WITH PROPOFOL N/A 11/29/2017   Procedure: ESOPHAGOGASTRODUODENOSCOPY (EGD) WITH PROPOFOL;  Surgeon: [name], MD;  Location: [facility] ENDOSCOPY;  Service: Endoscopy;  Laterality: N/A;  . INSERT / REPLACE / REMOVE PACEMAKER    . s/p pacer insertion     Social History   Socioeconomic History  . Marital status: Widowed    Spouse name: Not on file  . Number of children: Not on file  . Years of education: Not on file  . Highest education level: Not on file  Occupational History  . Occupation: retired  Scientific laboratory technician  . Financial resource strain: Not on file  . Food insecurity:    Worry: Not on file    Inability: Not on file  . Transportation needs:    Medical: Not on file    Non-medical: Not on file  Tobacco Use  . Smoking status: Never Smoker  . Smokeless tobacco: Never Used  Substance and Sexual Activity  . Alcohol use: No  . Drug use: No  . Sexual activity: Never  Lifestyle  . Physical activity:    Days per week: Not on file    Minutes per session: Not on file  . Stress: Not on file  Relationships  . Social connections:    Talks on phone: Not on file    Gets together: Not on file    Attends religious service: Not on file    Active member of club or organization: Not on file    Attends meetings of clubs or organizations: Not on file    Relationship status: Not on file  . Intimate partner violence:    Fear of current or ex partner: Not on file    Emotionally abused: Not on file    Physically abused: Not on file    Forced sexual activity: Not on file  Other Topics Concern  . Not on file  Social History Narrative  . Not on file   Social  History   Social History Narrative  . Not on file     ROS: Negative.     PE: HEENT: Negative. Lungs: Clear. Cardio: RR.  Assessment/Plan:  Proceed with planned transanal excision of polyp.     Forest Gleason Aleesha Ringstad 01/17/2018

## 2018-01-18 NOTE — Anesthesia Postprocedure Evaluation (Signed)
Anesthesia Post Note  [name]  Procedure(s) Performed: TRANSANAL EXCISION OF RECTAL POLYP (N/A )  Patient location during evaluation: PACU Anesthesia Type: General Level of consciousness: awake and alert and oriented Pain management: pain level controlled Vital Signs Assessment: post-procedure vital signs reviewed and stable Respiratory status: spontaneous breathing, nonlabored ventilation and respiratory function stable Cardiovascular status: blood pressure returned to baseline and stable Postop Assessment: no signs of nausea or vomiting Anesthetic complications: no     Last Vitals:  Vitals:   01/17/18 1305 01/17/18 1340  BP: (!) 141/74 125/67  Pulse: 65 60  Resp: 16   Temp: (!) 36.2 C   SpO2: 97% 99%    Last Pain:  Vitals:   01/17/18 1340  TempSrc:   PainSc: 0-No pain                 Shawniece Oyola

## 2018-01-20 ENCOUNTER — Other Ambulatory Visit: Payer: Self-pay | Admitting: Pathology

## 2018-01-20 LAB — SURGICAL PATHOLOGY

## 2018-01-22 ENCOUNTER — Telehealth: Payer: Self-pay | Admitting: *Deleted

## 2018-01-22 ENCOUNTER — Telehealth: Payer: Self-pay | Admitting: General Surgery

## 2018-01-22 MED ORDER — NITROFURANTOIN MONOHYD MACRO 100 MG PO CAPS: 100.0000 mg | ORAL_CAPSULE | Freq: Two times a day (BID) | ORAL | 0 refills | Status: AC

## 2018-01-22 NOTE — Telephone Encounter (Signed)
Spoke with Dr Bary Castilla about the patient and also her pathology is in. He will be calling the patient today.

## 2018-01-22 NOTE — Telephone Encounter (Signed)
Patient called and wanted to know if she can get a cream for irritation for her rectum area. She had surgery on 01/17/18

## 2018-01-22 NOTE — Telephone Encounter (Signed)
Patient to use A & D Ointment around rectal area. Notified patient as instructed,

## 2018-01-22 NOTE — Telephone Encounter (Signed)
Patient is calling said when she's going to the bathroom a lot more and it has an odor to her urine. Please call patient and advise.

## 2018-01-22 NOTE — Telephone Encounter (Signed)
The patient had contacted the on-call physician 3 days after she had undergone transanal excision of the low rectal polyp reporting incontinence of stool and urine.  She had been encouraged to come to the ED for evaluation, but declined.  The patient called today reporting that the stools are pudding like, occasionally incontinent but the main reason for her call was an odor with her urine.  The patient had undergone transanal excision with no bowel prep or antibiotics.  The muscle layer had been visualized most of the sphincter and the rectum at the time of resection and the defect closed.  She is been making use of Linzess for constipation, and was asked to discontinue this until her bowels are more firm rather than the pudding-like stools she reports at this time.  A prescription for nitrofurantoin will be sent to her pharmacy.  She reported that she was unable to come to the office today for examination.  As she reports no fever or chills and a good appetite, I elected not to push this issue.  The patient was advised that the small cancer was identified and completely resected based on pathologic review.  I offered to contact her daughter [name] to let her know, but she reported that she would do this herself.  Follow-up is presently planned for January 30, 2018.  She was encouraged to call early if she develops fever or chills.

## 2018-01-25 ENCOUNTER — Encounter: Payer: Self-pay | Admitting: General Surgery

## 2018-01-25 ENCOUNTER — Other Ambulatory Visit: Payer: Self-pay

## 2018-01-25 ENCOUNTER — Ambulatory Visit (INDEPENDENT_AMBULATORY_CARE_PROVIDER_SITE_OTHER): Payer: Medicare Other | Admitting: General Surgery

## 2018-01-25 ENCOUNTER — Other Ambulatory Visit: Payer: Medicare Other

## 2018-01-25 VITALS — BP 112/76 | HR 101 | Temp 97.7°F | Wt 182.0 lb

## 2018-01-25 DIAGNOSIS — R300 Dysuria: Secondary | ICD-10-CM | POA: Diagnosis not present

## 2018-01-25 DIAGNOSIS — C211 Malignant neoplasm of anal canal: Secondary | ICD-10-CM

## 2018-01-25 LAB — POCT URINALYSIS DIPSTICK
Bilirubin, UA: NEGATIVE
Blood, UA: NEGATIVE
Glucose, UA: NEGATIVE
Ketones, UA: NEGATIVE
Leukocytes, UA: NEGATIVE
Nitrite, UA: NEGATIVE
Protein, UA: NEGATIVE
Spec Grav, UA: 1.01 (ref 1.010–1.025)
Urobilinogen, UA: 1 E.U./dL
pH, UA: 5 (ref 5.0–8.0)

## 2018-01-25 NOTE — Patient Instructions (Addendum)
Please finish all of your antibiotic (Nitrofurantoin).  Apply Vaseline or baby cream bought over the counter.  We will call your daughter with information.

## 2018-01-25 NOTE — Progress Notes (Signed)
Patient ID: Kristi [name], female   DOB: 07-05-1940, 77 y.o.   MRN: [id number]  Chief Complaint  Patient presents with  . Routine Post Op    rectal polyp EX sx 01/17/18    HPI Kristi [name] is a 77 y.o. female for post operation rectal polyp excision surgery on 01/17/18. Patient stated that for the past three days she had noticed bilateral ankle and feet edema. Patient believes she has an UTI due to frequent urination. HPI  Past Medical History:  Diagnosis Date  . Anxiety   . Arthritis   . Broken arm    left FA 3/17  . Broken arm    left  . CAD (coronary artery disease)    s/p MI  . Cancer ([place])    uterine  . Cervical disc disorder   . Depression   . Diabetes mellitus without complication ([place])   . GERD (gastroesophageal reflux disease)   . Headache   . Hyperlipidemia   . Hypertension   . Hypothyroid   . Myocardial infarction ([place])    15 year ago  . Neck pain 06/04/2014  . Parkinson's disease ([place])   . Presence of permanent cardiac pacemaker   . Spinal stenosis     Past Surgical History:  Procedure Laterality Date  . ABDOMINAL HYSTERECTOMY    . BREAST SURGERY    . COLONOSCOPY    . COLONOSCOPY WITH PROPOFOL N/A 11/29/2017   Procedure: COLONOSCOPY WITH PROPOFOL;  Surgeon: [name], MD;  Location: [facility] ENDOSCOPY;  Service: Endoscopy;  Laterality: N/A;  . CORONARY ANGIOPLASTY     stent  . CORONARY ARTERY BYPASS GRAFT    . CORONARY STENT PLACEMENT    . ESOPHAGOGASTRODUODENOSCOPY (EGD) WITH PROPOFOL N/A 04/18/2017   Procedure: ESOPHAGOGASTRODUODENOSCOPY (EGD) WITH PROPOFOL;  Surgeon: [name], MD;  Location: [facility] ENDOSCOPY;  Service: Endoscopy;  Laterality: N/A;  . ESOPHAGOGASTRODUODENOSCOPY (EGD) WITH PROPOFOL N/A 11/29/2017   Procedure: ESOPHAGOGASTRODUODENOSCOPY (EGD) WITH PROPOFOL;  Surgeon: [name], MD;  Location: [facility] ENDOSCOPY;  Service: Endoscopy;  Laterality: N/A;  . INSERT / REPLACE / REMOVE PACEMAKER    . s/p pacer insertion    .  TRANSANAL EXCISION OF RECTAL MASS N/A 01/17/2018   Procedure: TRANSANAL EXCISION OF RECTAL POLYP;  Surgeon: [name], MD;  Location: [facility] ORS;  Service: General;  Laterality: N/A;    Family History  Problem Relation Age of Onset  . Cancer Mother   . Depression Mother   . Diabetes Mother   . Hypertension Mother   . Heart disease Father     Social History Social History   Tobacco Use  . Smoking status: Never Smoker  . Smokeless tobacco: Never Used  Substance Use Topics  . Alcohol use: No  . Drug use: No    Allergies  Allergen Reactions  . Penicillins Anaphylaxis  . Penicillin G Swelling and Rash    Has patient had a PCN reaction causing immediate rash, facial/tongue/throat swelling, SOB or lightheadedness with hypotension: Yes Has patient had a PCN reaction causing severe rash involving mucus membranes or skin necrosis: No Has patient had a PCN reaction that required hospitalization: No Has patient had a PCN reaction occurring within the last 10 years: No If all of the above answers are "NO", then may proceed with Cephalosporin use.     Current Outpatient Medications  Medication Sig Dispense Refill  . aspirin EC 81 MG tablet Take 81 mg by mouth daily.    . carbidopa-levodopa (SINEMET  IR) 25-100 MG tablet Take 1 tablet by mouth 3 (three) times daily.     . carvedilol (COREG) 3.125 MG tablet Take 1 tablet (3.125 mg total) by mouth 2 (two) times daily with a meal. 60 tablet 2  . docusate sodium (COLACE) 100 MG capsule Take 100 mg by mouth 2 (two) times daily.    . furosemide (LASIX) 40 MG tablet Take 40 mg by mouth daily.     [name] gabapentin (NEURONTIN) 300 MG capsule Take 300-600 mg by mouth See admin instructions. Take 300 mg by mouth and 600 mg at bedtime    . glimepiride (AMARYL) 1 MG tablet Take 1 tablet (1 mg total) by mouth daily with breakfast. 30 tablet 2  . HYDROcodone-acetaminophen (NORCO/VICODIN) 5-325 MG tablet Take 1 tablet by mouth every 4 (four) hours as  needed for moderate pain. 12 tablet 0  . hydrOXYzine (ATARAX/VISTARIL) 25 MG tablet Take 25 mg by mouth daily.    [name] levothyroxine (SYNTHROID, LEVOTHROID) 100 MCG tablet Take 100 mcg by mouth daily before breakfast.     . Linaclotide (LINZESS) 145 MCG CAPS capsule Take 145 mcg by mouth daily.    [name] loratadine (CLARITIN) 10 MG tablet Take 10 mg by mouth daily.    . nitrofurantoin, macrocrystal-monohydrate, (MACROBID) 100 MG capsule Take 1 capsule (100 mg total) by mouth 2 (two) times daily for 7 days. 14 capsule 0  . nitroGLYCERIN (NITROSTAT) 0.4 MG SL tablet Place 0.4 mg under the tongue every 5 (five) minutes as needed for chest pain.     [name] omeprazole (PRILOSEC) 20 MG capsule Take 20 mg by mouth 2 (two) times daily before a meal.     . potassium chloride SA (K-DUR,KLOR-CON) 20 MEQ tablet Take 20 mEq by mouth 2 (two) times daily.     . rosuvastatin (CRESTOR) 20 MG tablet Take 20 mg by mouth daily.    . sertraline (ZOLOFT) 100 MG tablet Take 50-100 mg by mouth See admin instructions. Take 100 mg in the am and 50 mg at bedtime.    . sucralfate (CARAFATE) 1 g tablet Take 1 g by mouth 3 (three) times daily.     No current facility-administered medications for this visit.     Review of Systems Review of Systems  Constitutional: Negative.   Respiratory: Negative.   Cardiovascular: Negative.   Genitourinary: Positive for frequency.  Skin: Negative.   Neurological: Negative.   Psychiatric/Behavioral: Negative.     Blood pressure 112/76, pulse (!) 101, temperature 97.7 F (36.5 C), temperature source Skin, weight 182 lb (82.6 kg), SpO2 95 %.  Physical Exam Physical Exam  Constitutional: She appears well-developed and well-nourished.  Cardiovascular: Normal rate, regular rhythm and normal heart sounds.  Pulmonary/Chest: Effort normal and breath sounds normal.  Genitourinary:     Musculoskeletal: She exhibits edema (bilateral ankle and feet).  Skin: Skin is warm and dry.  Psychiatric: She  has a normal mood and affect. Her behavior is normal. Judgment and thought content normal.    Data Reviewed A. 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  - SEE CANCER SUMMARY BELOW.  - HPV-RELATED CHANGES IN ADJACENT SQUAMOUS EPITHELIUM.   CANCER CASE SUMMARY: ANUS  Specimen: Anal canal  Procedure: Local excision (transanal disc excision)  Specimen integrity: Intact  Tumor Site: Anal canal  Tumor Size: Greatest dimension: 1.9 cm  Histologic type: Squamous cell carcinoma  Histologic grade: G3, poorly differentiated  Tumor Extension: Tumor invades anal sphincter muscle  Margins:    Deep margin: Uninvolved by invasive carcinoma       Distance of invasivecarcinoma from closest margin: 1.6 mm    Mucosal margin: Uninvolved by invasive carcinoma       Distance of invasive carcinoma from closest margin: 0.7 mm  Treatment Effect: No known presurgical therapy  Lymphovascular Invasion: Not identified  Perineural Invasion: Not identified  Pathologic Stage Classification (pTNM, AJCC 8th Edition): pT1 pNx   Assessment    Anal squamous cell carcinoma, 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    Plan    The patient's case had been presented at the [facility] tumor board after she had been in the office.  I contacted her daughter, [name], with recommendations that the patient be considered for chemotherapy and radiation.  The patient's daughter reported that they had seen her heart doctor, Dr. Lavera Guise after her visit with me and he said that he did not think she could tolerate treatment.  Considering the regimen likely to be recommended is well-tolerated, I think that the patient would benefit from at least meeting with medical oncology and having a discussion at the physician to physician level as to the particular medications to be used in the cardiotoxic effects.  The patient's daughter was amenable to  this.  The patient has been encouraged to make use of Vaseline or any comforting cream for the perianal irritation, but no indication for antibiotics at this time.  When we had spoken earlier in the week she had symptoms suggestive of dysuria and she was placed on Macrodantin which she is tolerated well.  A urine sample brought today showed a negative dipstick.    Patient is to finish all of her antibiotic. Patient is to keep her rectum dry and cleaned at all times.  HPI, Physical Exam, Assessment and Plan have been scribed under the direction and in the presence of [name], MD. Wayna Chalet, CMA  I have completed the exam and reviewed the above documentation for accuracy and completeness.  I agree with the above.  Haematologist has been used and any errors in dictation or transcription are unintentional.  Hervey Ard, M.D., F.A.C.S.  Forest Gleason Brinkley Peet 01/25/2018, 9:01 PM

## 2018-01-25 NOTE — Progress Notes (Signed)
Tumor Board Documentation  Kristi [name] was presented by Dr Bary Castilla at our Tumor Board on 01/25/2018, which included representatives from medical oncology, radiation oncology, surgical, radiology, research, pharmacy, internal medicine, navigation, pathology, pulmonology.  Kristi [name] currently presents   with history of the following treatments:  surgical intervention.  Additionally, we reviewed previous medical and familial history, history of present illness, and recent lab results along with all available histopathologic and imaging studies. The tumor board considered available treatment options and made the following recommendations: Additional screening, Chemotherapy Refer to Medical Oncology and GYN Oncology  The following procedures/referrals were also placed: No orders of the defined types were placed in this encounter.   Clinical Trial Status:     Staging used: AJCC Stage Group  National site-specific guidelines NCCN were discussed with respect to the case.  Tumor board is a meeting of clinicians from various specialty areas who evaluate and discuss patients for whom a multidisciplinary approach is being considered. Final determinations in the plan of care are those of the provider(s). The responsibility for follow up of recommendations given during tumor board is that of the provider.   Today's extended care, comprehensive team conference, Kristi [name] was not present for the discussion and was not examined.   Multidisciplinary Tumor Board is a multidisciplinary case peer review process.  Decisions discussed in the Multidisciplinary Tumor Board reflect the opinions of the specialists present at the conference without having examined the patient.  Ultimately, treatment and diagnostic decisions rest with the primary provider(s) and the patient.

## 2018-01-29 ENCOUNTER — Telehealth: Payer: Self-pay | Admitting: General Surgery

## 2018-01-29 NOTE — Telephone Encounter (Signed)
Patient's daughter is calling and has a question about her mothers appointment on the 19th and is asking if the appointment needs to be kept and if not she is asking for the referral to be sent to the cancer center. Please call patient and advise.

## 2018-01-29 NOTE — Telephone Encounter (Signed)
Spoke with patient and she is to keep the appointment 02/06/18 with Dr.Byrnett. Referral to cancer center will be addressed at the appointment.

## 2018-01-30 ENCOUNTER — Encounter: Payer: Medicare Other | Admitting: General Surgery

## 2018-02-05 ENCOUNTER — Other Ambulatory Visit: Payer: Self-pay

## 2018-02-05 ENCOUNTER — Encounter: Payer: Self-pay | Admitting: Radiation Oncology

## 2018-02-05 ENCOUNTER — Ambulatory Visit
Admission: RE | Admit: 2018-02-05 | Discharge: 2018-02-05 | Disposition: A | Discharge status: Home / Self Care | Payer: Medicare Other | Source: Ambulatory Visit | Attending: Radiation Oncology | Admitting: Radiation Oncology

## 2018-02-05 ENCOUNTER — Encounter (INDEPENDENT_AMBULATORY_CARE_PROVIDER_SITE_OTHER): Payer: Self-pay

## 2018-02-05 ENCOUNTER — Telehealth: Payer: Self-pay | Admitting: Pharmacist

## 2018-02-05 ENCOUNTER — Inpatient Hospital Stay: Payer: Medicare Other

## 2018-02-05 ENCOUNTER — Inpatient Hospital Stay: Payer: Medicare Other | Attending: Oncology | Admitting: Oncology

## 2018-02-05 ENCOUNTER — Encounter: Payer: Self-pay | Admitting: Oncology

## 2018-02-05 VITALS — BP 109/72 | HR 70 | Temp 96.6°F | Resp 18 | Ht 66.0 in | Wt 166.9 lb

## 2018-02-05 VITALS — BP 120/81 | HR 86 | Temp 97.0°F | Resp 16 | Wt 177.4 lb

## 2018-02-05 DIAGNOSIS — I2510 Atherosclerotic heart disease of native coronary artery without angina pectoris: Secondary | ICD-10-CM | POA: Insufficient documentation

## 2018-02-05 DIAGNOSIS — Z7984 Long term (current) use of oral hypoglycemic drugs: Secondary | ICD-10-CM | POA: Insufficient documentation

## 2018-02-05 DIAGNOSIS — Z809 Family history of malignant neoplasm, unspecified: Secondary | ICD-10-CM | POA: Insufficient documentation

## 2018-02-05 DIAGNOSIS — E119 Type 2 diabetes mellitus without complications: Secondary | ICD-10-CM | POA: Diagnosis not present

## 2018-02-05 DIAGNOSIS — I252 Old myocardial infarction: Secondary | ICD-10-CM | POA: Diagnosis not present

## 2018-02-05 DIAGNOSIS — Z950 Presence of cardiac pacemaker: Secondary | ICD-10-CM | POA: Diagnosis not present

## 2018-02-05 DIAGNOSIS — Z7982 Long term (current) use of aspirin: Secondary | ICD-10-CM | POA: Diagnosis not present

## 2018-02-05 DIAGNOSIS — C211 Malignant neoplasm of anal canal: Secondary | ICD-10-CM

## 2018-02-05 DIAGNOSIS — I25118 Atherosclerotic heart disease of native coronary artery with other forms of angina pectoris: Secondary | ICD-10-CM | POA: Diagnosis not present

## 2018-02-05 DIAGNOSIS — R140 Abdominal distension (gaseous): Secondary | ICD-10-CM | POA: Diagnosis not present

## 2018-02-05 DIAGNOSIS — G20 Parkinson's disease: Secondary | ICD-10-CM | POA: Insufficient documentation

## 2018-02-05 DIAGNOSIS — K219 Gastro-esophageal reflux disease without esophagitis: Secondary | ICD-10-CM | POA: Diagnosis not present

## 2018-02-05 DIAGNOSIS — F418 Other specified anxiety disorders: Secondary | ICD-10-CM | POA: Diagnosis not present

## 2018-02-05 DIAGNOSIS — I872 Venous insufficiency (chronic) (peripheral): Secondary | ICD-10-CM

## 2018-02-05 DIAGNOSIS — C210 Malignant neoplasm of anus, unspecified: Secondary | ICD-10-CM

## 2018-02-05 DIAGNOSIS — Z79899 Other long term (current) drug therapy: Secondary | ICD-10-CM | POA: Diagnosis not present

## 2018-02-05 DIAGNOSIS — M542 Cervicalgia: Secondary | ICD-10-CM | POA: Insufficient documentation

## 2018-02-05 DIAGNOSIS — M7989 Other specified soft tissue disorders: Secondary | ICD-10-CM

## 2018-02-05 DIAGNOSIS — R5383 Other fatigue: Secondary | ICD-10-CM | POA: Insufficient documentation

## 2018-02-05 DIAGNOSIS — R197 Diarrhea, unspecified: Secondary | ICD-10-CM | POA: Diagnosis not present

## 2018-02-05 DIAGNOSIS — E039 Hypothyroidism, unspecified: Secondary | ICD-10-CM | POA: Diagnosis not present

## 2018-02-05 DIAGNOSIS — M4800 Spinal stenosis, site unspecified: Secondary | ICD-10-CM | POA: Diagnosis not present

## 2018-02-05 DIAGNOSIS — Z7189 Other specified counseling: Secondary | ICD-10-CM

## 2018-02-05 DIAGNOSIS — I10 Essential (primary) hypertension: Secondary | ICD-10-CM | POA: Insufficient documentation

## 2018-02-05 DIAGNOSIS — E785 Hyperlipidemia, unspecified: Secondary | ICD-10-CM | POA: Diagnosis not present

## 2018-02-05 LAB — CBC WITH DIFFERENTIAL/PLATELET
Abs Immature Granulocytes: 0.01 10*3/uL (ref 0.00–0.07)
Basophils Absolute: 0 10*3/uL (ref 0.0–0.1)
Basophils Relative: 0 %
Eosinophils Absolute: 0.1 10*3/uL (ref 0.0–0.5)
Eosinophils Relative: 2 %
HCT: 37.5 % (ref 36.0–46.0)
Hemoglobin: 11.6 g/dL — ABNORMAL LOW (ref 12.0–15.0)
Immature Granulocytes: 0 %
Lymphocytes Relative: 27 %
Lymphs Abs: 2 10*3/uL (ref 0.7–4.0)
MCH: 29.7 pg (ref 26.0–34.0)
MCHC: 30.9 g/dL (ref 30.0–36.0)
MCV: 96.2 fL (ref 80.0–100.0)
Monocytes Absolute: 0.4 10*3/uL (ref 0.1–1.0)
Monocytes Relative: 5 %
Neutro Abs: 4.8 10*3/uL (ref 1.7–7.7)
Neutrophils Relative %: 66 %
Platelets: 231 10*3/uL (ref 150–400)
RBC: 3.9 MIL/uL (ref 3.87–5.11)
RDW: 14.1 % (ref 11.5–15.5)
WBC: 7.4 10*3/uL (ref 4.0–10.5)
nRBC: 0 % (ref 0.0–0.2)

## 2018-02-05 LAB — COMPREHENSIVE METABOLIC PANEL
ALT: 12 U/L (ref 0–44)
AST: 27 U/L (ref 15–41)
Albumin: 3.9 g/dL (ref 3.5–5.0)
Alkaline Phosphatase: 91 U/L (ref 38–126)
Anion gap: 10 (ref 5–15)
BUN: 20 mg/dL (ref 8–23)
CO2: 32 mmol/L (ref 22–32)
Calcium: 9 mg/dL (ref 8.9–10.3)
Chloride: 98 mmol/L (ref 98–111)
Creatinine, Ser: 0.98 mg/dL (ref 0.44–1.00)
GFR calc Af Amer: >60 mL/min (ref >60)
GFR calc non Af Amer: 53 mL/min — ABNORMAL LOW (ref >60)
Glucose, Bld: 140 mg/dL — ABNORMAL HIGH (ref 70–99)
Potassium: 3.8 mmol/L (ref 3.5–5.1)
Sodium: 140 mmol/L (ref 135–145)
Total Bilirubin: 0.3 mg/dL (ref 0.3–1.2)
Total Protein: 7.1 g/dL (ref 6.5–8.1)

## 2018-02-05 NOTE — Telephone Encounter (Signed)
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  CMP from 02/05/18 assessed, no relevant lab abnormalities. Prescription dose and frequency assessed.   Current medication list in Epic reviewed, one relevant DDIs with Xeloda identified: - Omeprazole: Proton Pump Inhibitors (PPI) may diminish the therapeutic effect of capecitabine. Recommend evaluating the need for a PPI/acid suppression. If acid suppression is needed, recommend switching to a H2 antagonist (eg, ranitidine) to avoid this DDI. This interaction was discussed during the patient education for Xeloda  Prescription has been e-scribed to the [facility] for benefits analysis and approval.  Patient education Counseled patient and her granddaughter on administration, dosing, side effects, monitoring, drug-food interactions, safe handling, storage, and disposal. Patient will take Take 3 tablets (1,500 mg total) by mouth 2 (two) times daily after a meal. Take Monday through Friday only on days of radiation.  Side effects include but not limited to: diarrhea, hand-foot syndrome, N/V, decreased wbc/hgb/plt, fatigue.    Reviewed with patient importance of keeping a medication schedule and plan for any missed doses.  [name] and her granddaughter voiced understanding and appreciation. All questions answered. Medication handout provided and consent obtained.  Oral Oncology Clinic will continue to follow for insurance authorization, copayment issues, and start date.  Provided patient with [facility] phone number. Patient knows to call the office with questions or concerns. Oral Chemotherapy Navigation Clinic will continue to follow.  Darl Pikes, PharmD, BCPS,  [facility] Hematology/Oncology Clinical Pharmacist [facility] [place] [facility] [phone]  02/05/2018 4:16 PM

## 2018-02-05 NOTE — Consult Note (Signed)
NEW PATIENT EVALUATION  Name: Kristi [name]  MRN: [id number]  Date:   02/05/2018     DOB: [birth year]   This 77 y.o. 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  REFERRING PHYSICIAN: Lorelee Market, MD  CHIEF COMPLAINT:  Chief Complaint  Patient presents with  . Cancer    Pt is here for initial consultation of anal cancer.       DIAGNOSIS: The encounter diagnosis was Cancer of anal canal ([place]).   PREVIOUS INVESTIGATIONS:  CT scans reviewed Pathology reports reviewed Clinical notes reviewed  HPI: patient is a 77 year old female who had identification on a colonoscopy with a polyp just above the dentate line.she's been having some bloating of the stomach as well as dyspepsia which prompted the colonoscopy. She was seen by Dr. Tollie Pizza who performed a transanal excision which showed invasive squamous cell carcinoma with basal Earnie Larsson features with HPV changes in the adjacent squamous epithelium. Tumor size was 1.9 cm poorly differentiatedwith tumor invading the anal sphincter. Margins were clear but close at 1.6 mm in the deep margin and 0.7 mm and mucosal margin.this was staged a T1 NX no lymph nodes were submitted. She is healing slowly with still some ulceration present in the rectal area. She is to see medical oncology today and is seen today for radiation oncology opinion. She is doing fair. She continues to have bloating and dyspepsia. She did have biopsy of her gastric mucosa showing gastritis.  PLANNED TREATMENT REGIMEN: possible concurrent chemoradiation  PAST MEDICAL HISTORY:  has a past medical history of Anxiety, Arthritis, Broken arm, Broken arm, CAD (coronary artery disease), Cancer ([place]), Cervical disc disorder, Depression, Diabetes mellitus without complication ([place]), GERD (gastroesophageal reflux disease), Headache, Hyperlipidemia, Hypertension, Hypothyroid,  Myocardial infarction [facility]), Neck pain (06/04/2014), Parkinson's disease ([place]), Presence of permanent cardiac pacemaker, and Spinal stenosis.    PAST SURGICAL HISTORY:  Past Surgical History:  Procedure Laterality Date  . ABDOMINAL HYSTERECTOMY     partial  . BREAST SURGERY    . COLONOSCOPY    . COLONOSCOPY WITH PROPOFOL N/A 11/29/2017   Procedure: COLONOSCOPY WITH PROPOFOL;  Surgeon: [name], MD;  Location: [facility] ENDOSCOPY;  Service: Endoscopy;  Laterality: N/A;  . CORONARY ANGIOPLASTY     stent  . CORONARY ARTERY BYPASS GRAFT    . CORONARY STENT PLACEMENT    . ESOPHAGOGASTRODUODENOSCOPY (EGD) WITH PROPOFOL N/A 04/18/2017   Procedure: ESOPHAGOGASTRODUODENOSCOPY (EGD) WITH PROPOFOL;  Surgeon: [name], MD;  Location: [facility] ENDOSCOPY;  Service: Endoscopy;  Laterality: N/A;  . ESOPHAGOGASTRODUODENOSCOPY (EGD) WITH PROPOFOL N/A 11/29/2017   Procedure: ESOPHAGOGASTRODUODENOSCOPY (EGD) WITH PROPOFOL;  Surgeon: [name], MD;  Location: [facility] ENDOSCOPY;  Service: Endoscopy;  Laterality: N/A;  . INSERT / REPLACE / REMOVE PACEMAKER    . s/p pacer insertion    . TRANSANAL EXCISION OF RECTAL MASS N/A 01/17/2018   Procedure: TRANSANAL EXCISION OF RECTAL POLYP;  Surgeon: [name], MD;  Location: [facility] ORS;  Service: General;  Laterality: N/A;    FAMILY HISTORY: family history includes Aneurysm in her mother; Cancer in her mother; Depression in her mother; Diabetes in her brother, brother, mother, and sister; Heart disease in her father; Hypertension in her mother.  SOCIAL HISTORY:  reports that she has never smoked. She has never used smokeless tobacco. She reports that she does not drink alcohol or use drugs.  ALLERGIES: Penicillins and Penicillin g  MEDICATIONS:  Current Outpatient Medications  Medication Sig Dispense Refill  . aspirin EC 81 MG tablet Take 81 mg by mouth daily.    . carbidopa-levodopa (SINEMET IR) 25-100 MG tablet Take 1 tablet by mouth 3 (three)  times daily.     . carvedilol (COREG) 3.125 MG tablet Take 1 tablet (3.125 mg total) by mouth 2 (two) times daily with a meal. 60 tablet 2  . docusate sodium (COLACE) 100 MG capsule Take 100 mg by mouth 2 (two) times daily.    . furosemide (LASIX) 40 MG tablet Take 40 mg by mouth daily.     [name] gabapentin (NEURONTIN) 300 MG capsule Take 300-600 mg by mouth See admin instructions. Take 300 mg by mouth and 600 mg at bedtime    . glimepiride (AMARYL) 1 MG tablet Take 1 tablet (1 mg total) by mouth daily with breakfast. 30 tablet 2  . HYDROcodone-acetaminophen (NORCO/VICODIN) 5-325 MG tablet Take 1 tablet by mouth every 4 (four) hours as needed for moderate pain. (Patient not taking: Reported on 02/05/2018) 12 tablet 0  . hydrOXYzine (ATARAX/VISTARIL) 25 MG tablet Take 25 mg by mouth daily.    [name] levothyroxine (SYNTHROID, LEVOTHROID) 100 MCG tablet Take 100 mcg by mouth daily before breakfast.     . Linaclotide (LINZESS) 145 MCG CAPS capsule Take 145 mcg by mouth daily.    [name] loratadine (CLARITIN) 10 MG tablet Take 10 mg by mouth daily.    . nitroGLYCERIN (NITROSTAT) 0.4 MG SL tablet Place 0.4 mg under the tongue every 5 (five) minutes as needed for chest pain.     [name] omeprazole (PRILOSEC) 20 MG capsule Take 20 mg by mouth 2 (two) times daily before a meal.     . ondansetron (ZOFRAN-ODT) 4 MG disintegrating tablet     . potassium chloride SA (K-DUR,KLOR-CON) 20 MEQ tablet Take 20 mEq by mouth 2 (two) times daily.     . rosuvastatin (CRESTOR) 20 MG tablet Take 20 mg by mouth daily.    . sertraline (ZOLOFT) 100 MG tablet Take 50-100 mg by mouth See admin instructions. Take 100 mg in the am and 50 mg at bedtime.    . sucralfate (CARAFATE) 1 g tablet Take 1 g by mouth 3 (three) times daily.     No current facility-administered medications for this encounter.     ECOG PERFORMANCE STATUS:  1 - Symptomatic but completely ambulatory  REVIEW OF SYSTEMS:  Patient denies any weight loss, fatigue, weakness,  fever, chills or night sweats. Patient denies any loss of vision, blurred vision. Patient denies any ringing  of the ears or hearing loss. No irregular heartbeat. Patient denies heart murmur or history of fainting. Patient denies any chest pain or pain radiating to her upper extremities. Patient denies any shortness of breath, difficulty breathing at night, cough or hemoptysis. Patient denies any swelling in the lower legs. Patient denies any nausea vomiting, vomiting of blood, or coffee ground material in the vomitus. Patient denies any stomach pain. Patient states has had normal bowel movements no significant constipation or diarrhea. Patient denies any dysuria, hematuria or significant nocturia. Patient denies any problems walking, swelling in the joints or loss of balance. Patient denies any skin changes, loss of hair or loss of weight. Patient denies any excessive worrying or anxiety or significant depression. Patient denies any problems with insomnia. Patient denies excessive thirst, polyuria, polydipsia. Patient denies any swollen glands, patient denies easy bruising or easy bleeding. Patient denies any recent infections, allergies or  URI. Patient "s visual fields have not changed significantly in recent time.    PHYSICAL EXAM: BP 120/81 (BP Location: Right Arm, Patient Position: Sitting)   Pulse 86   Temp (!) 97 F (36.1 C) (Tympanic)   Resp 16   Wt 177 lb 5.8 oz (80.4 kg)   BMI 27.78 kg/m  Frail-appearing elderly female in NAD.Well-developed well-nourished patient in NAD. HEENT reveals PERLA, EOMI, discs not visualized.  Oral cavity is clear. No oral mucosal lesions are identified. Neck is clear without evidence of cervical or supraclavicular adenopathy. Lungs are clear to A&P. Cardiac examination is essentially unremarkable with regular rate and rhythm without murmur rub or thrill. Abdomen is benign with no organomegaly or masses noted. Motor sensory and DTR levels are equal and symmetric in  the upper and lower extremities. Cranial nerves II through XII are grossly intact. Proprioception is intact. No peripheral adenopathy or edema is identified. No motor or sensory levels are noted. Crude visual fields are within normal range.  LABORATORY DATA: pathology reports reviewed    RADIOLOGY RESULTS:CT scans reviewed showing no evidence of disease   IMPRESSION: tage I squamous cell carcinoma of the anus in 77 year old female with close margins  PLAN: at this time if patient is physically able would plan a course of concurrent chemoradiation. I will wait medical oncology's opinion as toability to undergo systemic chemotherapy. My dose will be dependent on whether I go ahead with concurrent chemotherapy or radiation therapy alone. Risks and benefits of radiation including possible diarrhea fatigue alteration of blood counts possible scarring of her anal sphincter all were discussed in detail with the patient and her daughter. Will make formal recommendations after discussion with medical oncology. I have set her up for follow-up next week.  I would like to take this opportunity to thank you for allowing me to participate in the care of your patient.Noreene Filbert, MD

## 2018-02-05 NOTE — Progress Notes (Signed)
Patient here for follow up. States she feels sick on her stomach and has loose stools and feels dizzy . Granddaughter, Sharlene, present.

## 2018-02-05 NOTE — Progress Notes (Signed)
Met with Kristi [name] and her granddaughter during and after consults with Dr. Baruch Gouty and Dr. Tasia Catchings. Introduced nurse navigator services and provided contact information for future needs. She will follow up with Dr. Baruch Gouty 12/4 to allow for healing. Gyn referral has been made for pap/hpv per nccn guidelines. She will complete staging with CT CAP. Labs today to include HIV testing per nccn guidelines. She is also seeing oral chemo pharmacist today. Encouraged to call with any needs. Oncology Nurse Navigator Documentation  Navigator Location: [facility]Med Onc (02/05/18 1500)   )Navigator Encounter Type: Initial MedOnc;Initial RadOnc (02/05/18 1500)                     Patient Visit Type: MedOnc;RadOnc;Initial (02/05/18 1500)                              Time Spent with Patient: 60 (02/05/18 1500)

## 2018-02-06 ENCOUNTER — Ambulatory Visit (INDEPENDENT_AMBULATORY_CARE_PROVIDER_SITE_OTHER): Payer: Medicare Other | Admitting: General Surgery

## 2018-02-06 ENCOUNTER — Encounter: Payer: Self-pay | Admitting: General Surgery

## 2018-02-06 VITALS — BP 106/61 | HR 88 | Temp 97.7°F | Resp 20 | Wt 179.0 lb

## 2018-02-06 DIAGNOSIS — Z7189 Other specified counseling: Secondary | ICD-10-CM | POA: Insufficient documentation

## 2018-02-06 DIAGNOSIS — C211 Malignant neoplasm of anal canal: Secondary | ICD-10-CM

## 2018-02-06 LAB — HIV ANTIBODY (ROUTINE TESTING W REFLEX): HIV Screen 4th Generation wRfx: NONREACTIVE

## 2018-02-06 MED ORDER — CAPECITABINE 500 MG PO TABS: 825.0000 mg/m2 | ORAL_TABLET | Freq: Two times a day (BID) | ORAL | 0 refills | Status: DC

## 2018-02-06 NOTE — Progress Notes (Signed)
Patient ID: Kristi [name], female   DOB: 06-24-1940, 77 y.o.   MRN: [id number]  Chief Complaint  Patient presents with  . Follow-up    12 day f/u anal cancer    HPI Kristi [name] is a 77 y.o. female here today to follow up for 12 day anal cancer. Patient she is having some dizziness, constipation and nausea.   The patient has been seen by radiation and medical oncology and plans at present are for radiation to the area and oral chemotherapeutic agent.  Details not available at this time. HPI  Past Medical History:  Diagnosis Date  . Anxiety   . Arthritis   . Broken arm    left FA 3/17  . Broken arm    left  . CAD (coronary artery disease)    s/p MI  . Cancer ([place])    uterine  . Cervical disc disorder   . Depression   . Diabetes mellitus without complication ([place])   . GERD (gastroesophageal reflux disease)   . Headache   . Hyperlipidemia   . Hypertension   . Hypothyroid   . Myocardial infarction ([place])    15 year ago  . Neck pain 06/04/2014  . Parkinson's disease ([place])   . Presence of permanent cardiac pacemaker   . Spinal stenosis     Past Surgical History:  Procedure Laterality Date  . ABDOMINAL HYSTERECTOMY     partial  . BREAST SURGERY    . COLONOSCOPY    . COLONOSCOPY WITH PROPOFOL N/A 11/29/2017   Procedure: COLONOSCOPY WITH PROPOFOL;  Surgeon: [name], MD;  Location: [facility] ENDOSCOPY;  Service: Endoscopy;  Laterality: N/A;  . CORONARY ANGIOPLASTY     stent  . CORONARY ARTERY BYPASS GRAFT    . CORONARY STENT PLACEMENT    . ESOPHAGOGASTRODUODENOSCOPY (EGD) WITH PROPOFOL N/A 04/18/2017   Procedure: ESOPHAGOGASTRODUODENOSCOPY (EGD) WITH PROPOFOL;  Surgeon: [name], MD;  Location: [facility] ENDOSCOPY;  Service: Endoscopy;  Laterality: N/A;  . ESOPHAGOGASTRODUODENOSCOPY (EGD) WITH PROPOFOL N/A 11/29/2017   Procedure: ESOPHAGOGASTRODUODENOSCOPY (EGD) WITH PROPOFOL;  Surgeon: [name], MD;  Location: [facility] ENDOSCOPY;  Service: Endoscopy;  Laterality:  N/A;  . INSERT / REPLACE / REMOVE PACEMAKER    . s/p pacer insertion    . TRANSANAL EXCISION OF RECTAL MASS N/A 01/17/2018   Procedure: TRANSANAL EXCISION OF RECTAL POLYP;  Surgeon: [name], MD;  Location: [facility] ORS;  Service: General;  Laterality: N/A;    Family History  Problem Relation Age of Onset  . Cancer Mother   . Depression Mother   . Diabetes Mother   . Hypertension Mother   . Aneurysm Mother   . Heart disease Father   . Diabetes Sister   . Diabetes Brother   . Diabetes Brother     Social History Social History   Tobacco Use  . Smoking status: Never Smoker  . Smokeless tobacco: Never Used  Substance Use Topics  . Alcohol use: No  . Drug use: No    Allergies  Allergen Reactions  . Penicillins Anaphylaxis  . Penicillin G Swelling and Rash    Has patient had a PCN reaction causing immediate rash, facial/tongue/throat swelling, SOB or lightheadedness with hypotension: Yes Has patient had a PCN reaction causing severe rash involving mucus membranes or skin necrosis: No Has patient had a PCN reaction that required hospitalization: No Has patient had a PCN reaction occurring within the last 10 years: No If all of the above answers are "  NO", then may proceed with Cephalosporin use.     Current Outpatient Medications  Medication Sig Dispense Refill  . aspirin EC 81 MG tablet Take 81 mg by mouth daily.    . carbidopa-levodopa (SINEMET IR) 25-100 MG tablet Take 1 tablet by mouth 3 (three) times daily.     . carvedilol (COREG) 3.125 MG tablet Take 1 tablet (3.125 mg total) by mouth 2 (two) times daily with a meal. (Patient taking differently: Take 3.125 mg by mouth daily. ) 60 tablet 2  . docusate sodium (COLACE) 100 MG capsule Take 100 mg by mouth 2 (two) times daily.    . furosemide (LASIX) 40 MG tablet Take 40 mg by mouth daily.     [name] gabapentin (NEURONTIN) 300 MG capsule Take 300-600 mg by mouth See admin instructions. Take 300 mg by mouth and 600 mg at  bedtime    . glimepiride (AMARYL) 1 MG tablet Take 1 tablet (1 mg total) by mouth daily with breakfast. 30 tablet 2  . HYDROcodone-acetaminophen (NORCO/VICODIN) 5-325 MG tablet Take 1 tablet by mouth every 4 (four) hours as needed for moderate pain. 12 tablet 0  . hydrOXYzine (ATARAX/VISTARIL) 25 MG tablet Take 25 mg by mouth daily.    [name] levothyroxine (SYNTHROID, LEVOTHROID) 100 MCG tablet Take 100 mcg by mouth daily before breakfast.     . Linaclotide (LINZESS) 145 MCG CAPS capsule Take 145 mcg by mouth daily.    [name] loratadine (CLARITIN) 10 MG tablet Take 10 mg by mouth daily.    . nitroGLYCERIN (NITROSTAT) 0.4 MG SL tablet Place 0.4 mg under the tongue every 5 (five) minutes as needed for chest pain.     [name] omeprazole (PRILOSEC) 20 MG capsule Take 20 mg by mouth 2 (two) times daily before a meal.     . ondansetron (ZOFRAN-ODT) 4 MG disintegrating tablet     . potassium chloride SA (K-DUR,KLOR-CON) 20 MEQ tablet Take 20 mEq by mouth 2 (two) times daily.     . rosuvastatin (CRESTOR) 20 MG tablet Take 20 mg by mouth daily.    . sertraline (ZOLOFT) 100 MG tablet Take 50-100 mg by mouth See admin instructions. Take 100 mg in the am and 50 mg at bedtime.    . sucralfate (CARAFATE) 1 g tablet Take 1 g by mouth 3 (three) times daily.    . capecitabine (XELODA) 500 MG tablet Take 3 tablets (1,500 mg total) by mouth 2 (two) times daily after a meal. Take Monday through Friday only on days of radiation. 120 tablet 0   No current facility-administered medications for this visit.     Review of Systems Review of Systems  Constitutional: Negative.   Respiratory: Negative.   Cardiovascular: Negative.     Blood pressure 106/61, pulse 88, temperature 97.7 F (36.5 C), temperature source Temporal, resp. rate 20, weight 179 lb (81.2 kg), SpO2 96 %.  Physical Exam Physical Exam  Constitutional: She is oriented to person, place, and time. She appears well-developed and well-nourished.  Eyes: Conjunctivae  are normal. No scleral icterus.  Cardiovascular: Normal rate, regular rhythm and normal heart sounds.  Pulmonary/Chest: Effort normal and breath sounds normal.  Genitourinary:     Lymphadenopathy:    She has no cervical adenopathy.  Neurological: She is alert and oriented to person, place, and time.  No diplopia or nystagmus.  Cranial nerves appear intact.  Normal pupillary reflex.  Skin: Skin is warm and dry.    Data Reviewed Postural blood pressure and pulse  were obtained because of the patient's report of intermittent dizziness.  Supine: 103/67 with a pulse of 63.  Seated: 100/67 with a pulse of 70.  Standing 102/69 with a pulse of 69.  No significant postural change.  Assessment    Steady healing of the site where the anal cancer was excised.  Dizziness, moderate hypotension for a woman her age.  No postural change.    Plan.  Patient is to decrease her Coreg from twice daily to once per day.   She is to return in 1 month. Call the office with any questions or concerns. HPI, Physical Exam, Assessment and Plan have been scribed under the direction and in the presence of Hervey Ard, Md.  Eudelia Bunch R. Bobette Mo, CMA    I have completed the exam and reviewed the above documentation for accuracy and completeness.  I agree with the above.  Haematologist has been used and any errors in dictation or transcription are unintentional.  Hervey Ard, M.D., F.A.C.S.  Forest Gleason Isrrael Fluckiger 02/07/2018, 6:13 PM

## 2018-02-06 NOTE — Progress Notes (Signed)
Hematology/Oncology Consult note [facility] [phone] [phone]   Patient Care Team: Lorelee Market, MD as PCP - General (Family Medicine) Clent Jacks, RN as Registered Nurse  REFERRING PROVIDER: Dr.Byrnett CHIEF COMPLAINTS/REASON FOR VISIT:  Evaluation of anal cancer  HISTORY OF PRESENTING ILLNESS:  Kristi [name] is a  77 y.o.  female with PMH listed below who was referred to me for evaluation of anal cancer.  Patient had upper endoscopy and colonoscopy done on 11/29/2017. Colonoscopy showed diverticulosis in the sigmoid colon.  15 mm polyp at anus. Patient was referred to surgery Dr. 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  HPV related changes and adjacent squamous epithelium. Margins negative for invasive carcinoma. Lymphovascular invasion not identified. Perineural invasion not identified. Pathological staging pT1 Nx's  Patient presents for discussion of pathology results, cancer diagnosis, and discussion of adjuvant chemotherapy treatments. She reports feeling mildly nauseated, no vomiting.  All She has intermittent loose stools, denies any rectal bleeding or pain.   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 Recent 2D echo April 11, 2017 showed LVEF 65%, mild regurgitation of aortic valve, mitral valve and tricuspid valve.  Normal pulmonary artery pressures. Lower extremities swelling, chronic, she uses pump, follows up with vascular surgeon.   Review of Systems  Constitutional: Positive for malaise/fatigue and weight loss. Negative for chills and fever.  HENT: Negative for nosebleeds and sore throat.   Eyes: Negative for redness.  Respiratory: Negative for cough.   Cardiovascular: Positive for leg swelling. Negative for chest pain and palpitations.  Gastrointestinal: Positive for diarrhea.  Negative for abdominal pain, blood in stool, nausea and vomiting.  Genitourinary: Negative for dysuria.  Musculoskeletal: Negative for myalgias and neck pain.  Skin: Negative for itching and rash.  Neurological: Positive for dizziness and weakness. Negative for tingling and tremors.  Endo/Heme/Allergies: Negative for environmental allergies. Does not bruise/bleed easily.  Psychiatric/Behavioral: Negative for hallucinations.    MEDICAL HISTORY:  Past Medical History:  Diagnosis Date  . Anxiety   . Arthritis   . Broken arm    left FA 3/17  . Broken arm    left  . CAD (coronary artery disease)    s/p MI  . Cancer ([place])    uterine  . Cervical disc disorder   . Depression   . Diabetes mellitus without complication ([place])   . GERD (gastroesophageal reflux disease)   . Headache   . Hyperlipidemia   . Hypertension   . Hypothyroid   . Myocardial infarction ([place])    15 year ago  . Neck pain 06/04/2014  . Parkinson's disease ([place])   . Presence of permanent cardiac pacemaker   . Spinal stenosis     SURGICAL HISTORY: Past Surgical History:  Procedure Laterality Date  . ABDOMINAL HYSTERECTOMY     partial  . BREAST SURGERY    . COLONOSCOPY    . COLONOSCOPY WITH PROPOFOL N/A 11/29/2017   Procedure: COLONOSCOPY WITH PROPOFOL;  Surgeon: [name], MD;  Location: [facility] ENDOSCOPY;  Service: Endoscopy;  Laterality: N/A;  . CORONARY ANGIOPLASTY     stent  . CORONARY ARTERY BYPASS GRAFT    . CORONARY STENT PLACEMENT    . ESOPHAGOGASTRODUODENOSCOPY (EGD) WITH PROPOFOL N/A 04/18/2017   Procedure: ESOPHAGOGASTRODUODENOSCOPY (EGD) WITH PROPOFOL;  Surgeon: [name], MD;  Location: [facility] ENDOSCOPY;  Service: Endoscopy;  Laterality: N/A;  . ESOPHAGOGASTRODUODENOSCOPY (EGD) WITH PROPOFOL N/A 11/29/2017  Procedure: ESOPHAGOGASTRODUODENOSCOPY (EGD) WITH PROPOFOL;  Surgeon: [name], MD;  Location: [facility] ENDOSCOPY;  Service: Endoscopy;  Laterality: N/A;  . INSERT / REPLACE / REMOVE  PACEMAKER    . s/p pacer insertion    . TRANSANAL EXCISION OF RECTAL MASS N/A 01/17/2018   Procedure: TRANSANAL EXCISION OF RECTAL POLYP;  Surgeon: [name], MD;  Location: [facility] ORS;  Service: General;  Laterality: N/A;    SOCIAL HISTORY: Social History   Socioeconomic History  . Marital status: Widowed    Spouse name: Not on file  . Number of children: Not on file  . Years of education: Not on file  . Highest education level: Not on file  Occupational History  . Occupation: retired  Scientific laboratory technician  . Financial resource strain: Not on file  . Food insecurity:    Worry: Not on file    Inability: Not on file  . Transportation needs:    Medical: Not on file    Non-medical: Not on file  Tobacco Use  . Smoking status: Never Smoker  . Smokeless tobacco: Never Used  Substance and Sexual Activity  . Alcohol use: No  . Drug use: No  . Sexual activity: Never  Lifestyle  . Physical activity:    Days per week: Not on file    Minutes per session: Not on file  . Stress: Not on file  Relationships  . Social connections:    Talks on phone: Not on file    Gets together: Not on file    Attends religious service: Not on file    Active member of club or organization: Not on file    Attends meetings of clubs or organizations: Not on file    Relationship status: Not on file  . Intimate partner violence:    Fear of current or ex partner: Not on file    Emotionally abused: Not on file    Physically abused: Not on file    Forced sexual activity: Not on file  Other Topics Concern  . Not on file  Social History Narrative  . Not on file    FAMILY HISTORY: Family History  Problem Relation Age of Onset  . Cancer Mother   . Depression Mother   . Diabetes Mother   . Hypertension Mother   . Aneurysm Mother   . Heart disease Father   . Diabetes Sister   . Diabetes Brother   . Diabetes Brother     ALLERGIES:  is allergic to penicillins and penicillin g.  MEDICATIONS:    Current Outpatient Medications  Medication Sig Dispense Refill  . aspirin EC 81 MG tablet Take 81 mg by mouth daily.    . carbidopa-levodopa (SINEMET IR) 25-100 MG tablet Take 1 tablet by mouth 3 (three) times daily.     . carvedilol (COREG) 3.125 MG tablet Take 1 tablet (3.125 mg total) by mouth 2 (two) times daily with a meal. (Patient taking differently: Take 3.125 mg by mouth daily. ) 60 tablet 2  . docusate sodium (COLACE) 100 MG capsule Take 100 mg by mouth 2 (two) times daily.    . furosemide (LASIX) 40 MG tablet Take 40 mg by mouth daily.     [name] gabapentin (NEURONTIN) 300 MG capsule Take 300-600 mg by mouth See admin instructions. Take 300 mg by mouth and 600 mg at bedtime    . glimepiride (AMARYL) 1 MG tablet Take 1 tablet (1 mg total) by mouth daily with breakfast. 30 tablet 2  .  hydrOXYzine (ATARAX/VISTARIL) 25 MG tablet Take 25 mg by mouth daily.    [name] levothyroxine (SYNTHROID, LEVOTHROID) 100 MCG tablet Take 100 mcg by mouth daily before breakfast.     . Linaclotide (LINZESS) 145 MCG CAPS capsule Take 145 mcg by mouth daily.    [name] loratadine (CLARITIN) 10 MG tablet Take 10 mg by mouth daily.    . nitroGLYCERIN (NITROSTAT) 0.4 MG SL tablet Place 0.4 mg under the tongue every 5 (five) minutes as needed for chest pain.     [name] omeprazole (PRILOSEC) 20 MG capsule Take 20 mg by mouth 2 (two) times daily before a meal.     . ondansetron (ZOFRAN-ODT) 4 MG disintegrating tablet     . potassium chloride SA (K-DUR,KLOR-CON) 20 MEQ tablet Take 20 mEq by mouth 2 (two) times daily.     . rosuvastatin (CRESTOR) 20 MG tablet Take 20 mg by mouth daily.    . sertraline (ZOLOFT) 100 MG tablet Take 50-100 mg by mouth See admin instructions. Take 100 mg in the am and 50 mg at bedtime.    . sucralfate (CARAFATE) 1 g tablet Take 1 g by mouth 3 (three) times daily.    . capecitabine (XELODA) 500 MG tablet Take 3 tablets (1,500 mg total) by mouth 2 (two) times daily after a meal. Take Monday through Friday  only on days of radiation. 120 tablet 0  . HYDROcodone-acetaminophen (NORCO/VICODIN) 5-325 MG tablet Take 1 tablet by mouth every 4 (four) hours as needed for moderate pain. 12 tablet 0   No current facility-administered medications for this visit.      PHYSICAL EXAMINATION: ECOG PERFORMANCE STATUS: 2 - Symptomatic, <50% confined to bed Vitals:   02/05/18 1440  BP: 109/72  Pulse: 70  Resp: 18  Temp: (!) 96.6 F (35.9 C)   Filed Weights   02/05/18 1440  Weight: 166 lb 14.4 oz (75.7 kg)    Physical Exam  Constitutional: She is oriented to person, place, and time. No distress.  HENT:  Head: Normocephalic and atraumatic.  Mouth/Throat: Oropharynx is clear and moist.  Eyes: Pupils are equal, round, and reactive to light. EOM are normal. No scleral icterus.  Neck: Normal range of motion. Neck supple.  Cardiovascular: Normal rate and regular rhythm.  Pulmonary/Chest: Effort normal. No respiratory distress. She has no wheezes.  Abdominal: Soft. Bowel sounds are normal. She exhibits no distension.  Musculoskeletal: Normal range of motion. She exhibits no edema or deformity.  Neurological: She is alert and oriented to person, place, and time. No cranial nerve deficit.  Skin: Skin is warm and dry. No erythema.  Psychiatric: She has a normal mood and affect.     LABORATORY DATA:  I have reviewed the data as listed Lab Results  Component Value Date   WBC 7.4 02/05/2018   HGB 11.6 (L) 02/05/2018   HCT 37.5 02/05/2018   MCV 96.2 02/05/2018   PLT 231 02/05/2018   Recent Labs    04/10/17 2305  04/27/17 1458 09/05/17 1035 01/11/18 1151 02/05/18 1614  NA 129*   < > 140  --  142 140  K 3.8   < > 3.8  --  3.9 3.8  CL 87*   < > 101  --  106 98  CO2 30   < > 28  --  28 32  GLUCOSE 708*   < > 129*  --  132* 140*  BUN 19   < > 23*  --  19 20  CREATININE  0.99   < > 0.84 0.80 0.76 0.98  CALCIUM 8.9   < > 9.3  --  9.3 9.0  GFRNONAA 53*   < > >60  --  >60 53*  GFRAA >60   < > >60   --  >60 >60  PROT 6.7  --   --   --   --  7.1  ALBUMIN 3.4*  --   --   --   --  3.9  AST 49*  --   --   --   --  27  ALT 6*  --   --   --   --  12  ALKPHOS 151*  --   --   --   --  91  BILITOT 0.7  --   --   --   --  0.3  BILIDIR 0.1  --   --   --   --   --   IBILI 0.6  --   --   --   --   --    < > = values in this interval not displayed.   Iron/TIBC/Ferritin/ %Sat No results found for: IRON, TIBC, FERRITIN, IRONPCTSAT   RADIOGRAPHIC STUDIES: I have personally reviewed the radiological images as listed and agreed with the findings in the report. 09/05/2017 CT abdomen pelvis with contrast  1. No definite explanation for the patient's symptoms is seen. There is a moderate amount of feces throughout the colon which may represent constipation.2. Small hiatal hernia.3. Moderate abdominal aortic atherosclerosis. 4. Degenerative changes in the lumbar spine with exaggerated lumbarlordosis.  ASSESSMENT & PLAN:  1. Anal cancer ([place])   2. Other fatigue   3. Leg swelling   4. Goals of care, counseling/discussion   5. Coronary artery disease of native artery of native heart with stable angina pectoris ([place])   6. Cardiac pacemaker in situ   clinical pT1,cN0 M0.  I independently reviewed patient's CT findings, endoscopy and colonoscopy findings and pathology findings.  Discussed with patient and her daughter.  The anal cancer diagnosis and care plan were discussed with patient in detail.  NCCN guidelines were reviewed and shared with patient and daughter. Standard adjuvant concurrent chemo/Radiation was discussed.   The goal of adjuvant chemotherapy and radiation was discussed. Curable intent.  Comparing to Radiation alone, chemoradiotherapy was associated with significant reductions in local failure (61 versus 39 percent) and cause-related mortality (28 versus 39 percent). Overall survival is similar.  NCCN guidelines recommend definitive chemoradiotherapy for all patients with anal canal SCC,  even those with early stage tumors. Patient has multiple comorbidity, advanced age, I would omit mitomycin and only use 5-FU or capecitabine with concurrent radiation. RT alone is an option, better than no adjuvant treatment.  We had discussed the composition of chemotherapy regimen, length of chemo cycle, duration of treatment and the time to assess response to treatment.  Supportive care measures are necessary for patient well-being and will be provided as necessary.  Patient prefers to  try oral chemotherapy with capecitabine with RT. If she does not tolerate well, will discontinue and proceed with RT alone.  She has remote history of MI, CAD, pace maker, will touch base with her cardiologist Dr.Masoud.    I explained to the patient the risks and benefits of chemotherapy capecitabine including all but not limited to hair loss, mouth sore, nausea, vomiting, low blood counts, bleeding, and risk of life threatening infection and even death, chest pain/angina, hand and foot syndrome,  etc.  Patient voices understanding and willing to proceed chemotherapy.   # Chemotherapy education She is still recovering from her surgery and has met Dr.Chrystal who has recommend adjuvant RT, and she will be re-evaluated again on 02/21/2018.  # Chronic lower extremity swelling, due to venous insufficiency. Follows up with vascular surgeon.   # I would obtain CT chest abdomen pelvis for staging, check cbc, cmp, HIV. Also refer patient to GYN for evaluation.   We spent sufficient time to discuss many aspect of care, questions were answered to patient's satisfaction.   Orders Placed This Encounter  Procedures  . CT Chest W Contrast    Standing Status:   Future    Standing Expiration Date:   02/05/2019    Order Specific Question:   If indicated for the ordered procedure, I authorize the administration of contrast media per Radiology protocol    Answer:   Yes    Order Specific Question:   Preferred imaging location?     Answer:   [facility]    Order Specific Question:   Radiology Contrast Protocol - do NOT remove file path    Answer:   \\[email or web address]  . CT Abdomen Pelvis W Contrast    Standing Status:   Future    Standing Expiration Date:   02/05/2019    Order Specific Question:   If indicated for the ordered procedure, I authorize the administration of contrast media per Radiology protocol    Answer:   Yes    Order Specific Question:   [facility] location?    [facility]    Order Specific Question:   Is Oral Contrast requested for this exam?    Answer:   Yes, Per Radiology protocol    Order Specific Question:   Radiology Contrast Protocol - do NOT remove file path    Answer:   \\[email or web address].pdf  . HIV Antibody (routine testing w rflx)    Standing Status:   Future    Number of Occurrences:   1    Standing Expiration Date:   02/06/2019  . CBC with Differential/Platelet    Standing Status:   Future    Number of Occurrences:   1    Standing Expiration Date:   02/06/2019  . Comprehensive metabolic panel    Standing Status:   Future    Number of Occurrences:   1    Standing Expiration Date:   02/06/2019    All questions were answered. The patient knows to call the clinic with any problems questions or concerns.  Return of visit: to be determined.  Thank you for this kind referral and the opportunity to participate in the care of this patient. A copy of today's note is routed to referring provider  Total face to face encounter time for this patient visit was 70 min. >50% of the time was  spent in counseling and coordination of care.    Earlie Server, MD, PhD Hematology Oncology [facility] Pager- [phone] 02/06/2018

## 2018-02-06 NOTE — Patient Instructions (Addendum)
Patient is to take Coreg 1x a day starting today. She is to return in 1 month. Call the office with any questions or concerns.

## 2018-02-07 ENCOUNTER — Telehealth: Payer: Self-pay | Admitting: Pharmacy Technician

## 2018-02-07 MED FILL — XELODA 500 MG TABLET: 500 | 28 days supply | Qty: 120 | Fill #0

## 2018-02-07 NOTE — Telephone Encounter (Signed)
Oral Oncology Patient Advocate Encounter  After completing a benefits investigation for Xeloda through [facility], no prior authorization is required.  [employer] (part D) will not cover the medication under the plan because Xeloda is covered under Medicare Part B. Patient also has Medicaid.  Copay is $0.00.  Will set up delivery of medication from the [facility].  Patient will hold onto medication until she starts radiation.  [place] Patient [place] Phone [phone] Fax [phone] 02/07/2018 9:48 AM

## 2018-02-07 NOTE — Telephone Encounter (Signed)
Scheduled delivery of Xeloda for 02/08/18 from [facility].  Patient is aware to take on days of radiation only.

## 2018-02-12 ENCOUNTER — Ambulatory Visit
Admission: RE | Admit: 2018-02-12 | Discharge: 2018-02-12 | Disposition: A | Discharge status: Home / Self Care | Payer: Medicare Other | Source: Ambulatory Visit | Attending: Oncology | Admitting: Oncology

## 2018-02-12 ENCOUNTER — Other Ambulatory Visit: Payer: Self-pay | Admitting: Oncology

## 2018-02-12 DIAGNOSIS — C210 Malignant neoplasm of anus, unspecified: Secondary | ICD-10-CM

## 2018-02-12 DIAGNOSIS — R590 Localized enlarged lymph nodes: Secondary | ICD-10-CM

## 2018-02-12 MED ORDER — IOPAMIDOL (ISOVUE-300) INJECTION 61%
100.0000 mL | Freq: Once | INTRAVENOUS | Status: AC | PRN
  Administered 2018-02-12: 100 mL via INTRAVENOUS

## 2018-02-13 ENCOUNTER — Telehealth: Payer: Self-pay

## 2018-02-13 NOTE — Telephone Encounter (Signed)
At request of Dr. Tasia Catchings, I have called and went over results of CT scan. PET scan has been ordered and scheduled for 12/3 with arrival time of 0900. Went over date/time/location/instructions for PET. Read back performed. She will see Dr. Tasia Catchings 12/4 as previously scheduled for results. Oncology Nurse Navigator Documentation  Navigator Location: [facility] (02/13/18 1600)   )Navigator Encounter Type: Telephone (02/13/18 1600) Telephone: Outgoing Call;Diagnostic Results (02/13/18 1600)                                                  Time Spent with Patient: 15 (02/13/18 1600)

## 2018-02-19 ENCOUNTER — Ambulatory Visit: Payer: Medicare Other | Admitting: Radiation Oncology

## 2018-02-20 ENCOUNTER — Encounter
Admission: RE | Admit: 2018-02-20 | Discharge: 2018-02-20 | Disposition: A | Discharge status: Home / Self Care | Payer: Medicare Other | Source: Ambulatory Visit | Attending: Oncology | Admitting: Oncology

## 2018-02-20 DIAGNOSIS — R590 Localized enlarged lymph nodes: Secondary | ICD-10-CM | POA: Diagnosis present

## 2018-02-20 DIAGNOSIS — C210 Malignant neoplasm of anus, unspecified: Secondary | ICD-10-CM | POA: Diagnosis not present

## 2018-02-20 LAB — GLUCOSE, CAPILLARY: Glucose-Capillary: 99 mg/dL (ref 70–99)

## 2018-02-21 ENCOUNTER — Encounter: Payer: Self-pay | Admitting: Radiation Oncology

## 2018-02-21 ENCOUNTER — Encounter: Payer: Self-pay | Admitting: Oncology

## 2018-02-21 ENCOUNTER — Other Ambulatory Visit: Payer: Self-pay

## 2018-02-21 ENCOUNTER — Inpatient Hospital Stay: Payer: Medicare Other | Attending: Oncology | Admitting: Oncology

## 2018-02-21 ENCOUNTER — Ambulatory Visit
Admission: RE | Admit: 2018-02-21 | Discharge: 2018-02-21 | Disposition: A | Discharge status: Home / Self Care | Payer: Medicare Other | Source: Ambulatory Visit | Attending: Radiation Oncology | Admitting: Radiation Oncology

## 2018-02-21 VITALS — BP 130/83 | HR 86 | Temp 97.8°F | Resp 18 | Wt 177.3 lb

## 2018-02-21 DIAGNOSIS — R590 Localized enlarged lymph nodes: Secondary | ICD-10-CM | POA: Insufficient documentation

## 2018-02-21 DIAGNOSIS — I252 Old myocardial infarction: Secondary | ICD-10-CM | POA: Insufficient documentation

## 2018-02-21 DIAGNOSIS — C211 Malignant neoplasm of anal canal: Secondary | ICD-10-CM | POA: Diagnosis not present

## 2018-02-21 DIAGNOSIS — I429 Cardiomyopathy, unspecified: Secondary | ICD-10-CM | POA: Diagnosis not present

## 2018-02-21 DIAGNOSIS — K6289 Other specified diseases of anus and rectum: Secondary | ICD-10-CM | POA: Insufficient documentation

## 2018-02-21 DIAGNOSIS — I509 Heart failure, unspecified: Secondary | ICD-10-CM | POA: Diagnosis not present

## 2018-02-21 DIAGNOSIS — I2510 Atherosclerotic heart disease of native coronary artery without angina pectoris: Secondary | ICD-10-CM | POA: Diagnosis not present

## 2018-02-21 DIAGNOSIS — J329 Chronic sinusitis, unspecified: Secondary | ICD-10-CM | POA: Diagnosis not present

## 2018-02-21 DIAGNOSIS — Z950 Presence of cardiac pacemaker: Secondary | ICD-10-CM | POA: Diagnosis not present

## 2018-02-21 DIAGNOSIS — C210 Malignant neoplasm of anus, unspecified: Secondary | ICD-10-CM | POA: Insufficient documentation

## 2018-02-21 NOTE — Progress Notes (Signed)
Hematology/Oncology Consult note [facility] [phone] [phone]   Patient Care Team: Lorelee Market, MD as PCP - General (Family Medicine) Clent Jacks, RN as Registered Nurse  REFERRING PROVIDER: Dr.Byrnett CHIEF COMPLAINTS/REASON FOR VISIT:  Evaluation of anal cancer  HISTORY OF PRESENTING ILLNESS:  Kristi [name] is a  77 y.o.  female with PMH listed below who was referred to me for evaluation of anal cancer.  Patient had upper endoscopy and colonoscopy done on 11/29/2017. Colonoscopy showed diverticulosis in the sigmoid colon.  15 mm polyp at anus. Patient was referred to surgery Dr. 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  HPV related changes and adjacent squamous epithelium. Margins negative for invasive carcinoma. Lymphovascular invasion not identified. Perineural invasion not identified. Pathological staging pT1 Nx's  Patient presents for discussion of pathology results, cancer diagnosis, and discussion of adjuvant chemotherapy treatments. She reports feeling mildly nauseated, no vomiting.  All She has intermittent loose stools, denies any rectal bleeding or pain.   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 Recent 2D echo April 11, 2017 showed LVEF 65%, mild regurgitation of aortic valve, mitral valve and tricuspid valve.  Normal pulmonary artery pressures. Lower extremities swelling, chronic, she uses pump, follows up with vascular surgeon.   INTERVAL HISTORY Kristi [name] is a 77 y.o. female who has above history reviewed by me today presents for follow up visit for management of Stage 1 anal cancer. Today patient has multiple complaints.  Complains of headache, also sinus congestion and her primary care physician started azithromycin 3 days ago.  Patient reports symptoms have not  resolved. Also reports anal discharges with odor.  Denies any perianal pain, fluid collection bloody discharge.  Notes that the rectal discharge has been there since she had anal surgery. Feels weak.  Denies any fever or chills.  She has seen Dr. Cheron Schaumann again today for discussion for radiation.  Review of Systems  Constitutional: Positive for fatigue. Negative for appetite change, chills and fever.  HENT:   Negative for hearing loss and voice change.        Sinus congestion  Eyes: Negative for eye problems.  Respiratory: Negative for chest tightness and cough.   Cardiovascular: Negative for chest pain.  Gastrointestinal: Negative for abdominal distention, abdominal pain and blood in stool.       Anal discharge  Endocrine: Negative for hot flashes.  Genitourinary: Negative for difficulty urinating and frequency.   Musculoskeletal: Negative for arthralgias.  Skin: Negative for itching and rash.  Neurological: Positive for headaches. Negative for extremity weakness.  Hematological: Negative for adenopathy.  Psychiatric/Behavioral: Negative for confusion.    MEDICAL HISTORY:  Past Medical History:  Diagnosis Date  . Anxiety   . Arthritis   . Broken arm    left FA 3/17  . Broken arm    left  . CAD (coronary artery disease)    s/p MI  . Cancer ([place])    uterine  . Cervical disc disorder   . Depression   . Diabetes mellitus without complication ([place])   . GERD (gastroesophageal reflux disease)   . Headache   . Hyperlipidemia   . Hypertension   . Hypothyroid   . Myocardial infarction ([place])    15 year ago  . Neck pain 06/04/2014  . Parkinson's disease ([place])   . Presence of permanent cardiac pacemaker   . Spinal stenosis  SURGICAL HISTORY: Past Surgical History:  Procedure Laterality Date  . ABDOMINAL HYSTERECTOMY     partial  . BREAST SURGERY    . COLONOSCOPY    . COLONOSCOPY WITH PROPOFOL N/A 11/29/2017   Procedure: COLONOSCOPY WITH PROPOFOL;  Surgeon: [name], MD;  Location: [facility] ENDOSCOPY;  Service: Endoscopy;  Laterality: N/A;  . CORONARY ANGIOPLASTY     stent  . CORONARY ARTERY BYPASS GRAFT    . CORONARY STENT PLACEMENT    . ESOPHAGOGASTRODUODENOSCOPY (EGD) WITH PROPOFOL N/A 04/18/2017   Procedure: ESOPHAGOGASTRODUODENOSCOPY (EGD) WITH PROPOFOL;  Surgeon: [name], MD;  Location: [facility] ENDOSCOPY;  Service: Endoscopy;  Laterality: N/A;  . ESOPHAGOGASTRODUODENOSCOPY (EGD) WITH PROPOFOL N/A 11/29/2017   Procedure: ESOPHAGOGASTRODUODENOSCOPY (EGD) WITH PROPOFOL;  Surgeon: [name], MD;  Location: [facility] ENDOSCOPY;  Service: Endoscopy;  Laterality: N/A;  . INSERT / REPLACE / REMOVE PACEMAKER    . s/p pacer insertion    . TRANSANAL EXCISION OF RECTAL MASS N/A 01/17/2018   Procedure: TRANSANAL EXCISION OF RECTAL POLYP;  Surgeon: [name], MD;  Location: [facility] ORS;  Service: General;  Laterality: N/A;    SOCIAL HISTORY: Social History   Socioeconomic History  . Marital status: Widowed    Spouse name: Not on file  . Number of children: Not on file  . Years of education: Not on file  . Highest education level: Not on file  Occupational History  . Occupation: retired  Scientific laboratory technician  . Financial resource strain: Not on file  . Food insecurity:    Worry: Not on file    Inability: Not on file  . Transportation needs:    Medical: Not on file    Non-medical: Not on file  Tobacco Use  . Smoking status: Never Smoker  . Smokeless tobacco: Never Used  Substance and Sexual Activity  . Alcohol use: No  . Drug use: No  . Sexual activity: Never  Lifestyle  . Physical activity:    Days per week: Not on file    Minutes per session: Not on file  . Stress: Not on file  Relationships  . Social connections:    Talks on phone: Not on file    Gets together: Not on file    Attends religious service: Not on file    Active member of club or organization: Not on file    Attends meetings of clubs or organizations: Not on file     Relationship status: Not on file  . Intimate partner violence:    Fear of current or ex partner: Not on file    Emotionally abused: Not on file    Physically abused: Not on file    Forced sexual activity: Not on file  Other Topics Concern  . Not on file  Social History Narrative  . Not on file    FAMILY HISTORY: Family History  Problem Relation Age of Onset  . Cancer Mother   . Depression Mother   . Diabetes Mother   . Hypertension Mother   . Aneurysm Mother   . Heart disease Father   . Diabetes Sister   . Diabetes Brother   . Diabetes Brother     ALLERGIES:  is allergic to penicillins and penicillin g.  MEDICATIONS:  Current Outpatient Medications  Medication Sig Dispense Refill  . aspirin EC 81 MG tablet Take 81 mg by mouth daily.    [name] azithromycin (ZITHROMAX) 250 MG tablet Take 250 mg by mouth daily.    [name]  carbidopa-levodopa (SINEMET IR) 25-100 MG tablet Take 1 tablet by mouth 3 (three) times daily.     . carvedilol (COREG) 3.125 MG tablet Take 1 tablet (3.125 mg total) by mouth 2 (two) times daily with a meal. (Patient taking differently: Take 3.125 mg by mouth daily. ) 60 tablet 2  . docusate sodium (COLACE) 100 MG capsule Take 100 mg by mouth 2 (two) times daily.    . furosemide (LASIX) 40 MG tablet Take 40 mg by mouth daily.     [name] gabapentin (NEURONTIN) 300 MG capsule Take 300-600 mg by mouth See admin instructions. Take 300 mg by mouth and 600 mg at bedtime    . glimepiride (AMARYL) 1 MG tablet Take 1 tablet (1 mg total) by mouth daily with breakfast. 30 tablet 2  . hydrOXYzine (ATARAX/VISTARIL) 25 MG tablet Take 25 mg by mouth daily.    [name] levothyroxine (SYNTHROID, LEVOTHROID) 137 MCG tablet     . Linaclotide (LINZESS) 145 MCG CAPS capsule Take 145 mcg by mouth daily.    [name] loratadine (CLARITIN) 10 MG tablet Take 10 mg by mouth daily.    . meclizine (ANTIVERT) 12.5 MG tablet     . mupirocin ointment (BACTROBAN) 2 %     . nitroGLYCERIN (NITROSTAT) 0.4 MG SL tablet  Place 0.4 mg under the tongue every 5 (five) minutes as needed for chest pain.     [name] omeprazole (PRILOSEC) 20 MG capsule Take 20 mg by mouth 2 (two) times daily before a meal.     . ondansetron (ZOFRAN-ODT) 4 MG disintegrating tablet     . potassium chloride SA (K-DUR,KLOR-CON) 20 MEQ tablet Take 20 mEq by mouth 2 (two) times daily.     . rosuvastatin (CRESTOR) 20 MG tablet Take 20 mg by mouth daily.    . sertraline (ZOLOFT) 100 MG tablet Take 50-100 mg by mouth See admin instructions. Take 100 mg in the am and 50 mg at bedtime.    . sucralfate (CARAFATE) 1 g tablet Take 1 g by mouth 3 (three) times daily.    . capecitabine (XELODA) 500 MG tablet Take 3 tablets (1,500 mg total) by mouth 2 (two) times daily after a meal. Take Monday through Friday only on days of radiation. (Patient not taking: Reported on 02/21/2018) 120 tablet 0  . HYDROcodone-acetaminophen (NORCO/VICODIN) 5-325 MG tablet Take 1 tablet by mouth every 4 (four) hours as needed for moderate pain. (Patient not taking: Reported on 02/21/2018) 12 tablet 0   No current facility-administered medications for this visit.      PHYSICAL EXAMINATION: ECOG PERFORMANCE STATUS: 2 - Symptomatic, <50% confined to bed Vitals:   02/21/18 1021  BP: 130/83  Pulse: 86  Resp: 18  Temp: 97.8 F (36.6 C)   Filed Weights   02/21/18 1021  Weight: 177 lb 4.8 oz (80.4 kg)    Physical Exam  Constitutional: She is oriented to person, place, and time. No distress.  HENT:  Head: Normocephalic and atraumatic.  Mouth/Throat: Oropharynx is clear and moist.  Eyes: Pupils are equal, round, and reactive to light. EOM are normal. No scleral icterus.  Neck: Normal range of motion. Neck supple.  Cardiovascular: Normal rate, regular rhythm and normal heart sounds.  Pulmonary/Chest: Effort normal. No respiratory distress. She has no wheezes.  Abdominal: Soft. Bowel sounds are normal. She exhibits no distension and no mass. There is no tenderness.   Musculoskeletal: Normal range of motion. She exhibits no edema or deformity.  Neurological: She is alert and  oriented to person, place, and time. No cranial nerve deficit. Coordination normal.  Skin: Skin is warm and dry. No rash noted. No erythema.  Psychiatric: She has a normal mood and affect.     LABORATORY DATA:  I have reviewed the data as listed Lab Results  Component Value Date   WBC 7.4 02/05/2018   HGB 11.6 (L) 02/05/2018   HCT 37.5 02/05/2018   MCV 96.2 02/05/2018   PLT 231 02/05/2018   Recent Labs    04/10/17 2305  04/27/17 1458 09/05/17 1035 01/11/18 1151 02/05/18 1614  NA 129*   < > 140  --  142 140  K 3.8   < > 3.8  --  3.9 3.8  CL 87*   < > 101  --  106 98  CO2 30   < > 28  --  28 32  GLUCOSE 708*   < > 129*  --  132* 140*  BUN 19   < > 23*  --  19 20  CREATININE 0.99   < > 0.84 0.80 0.76 0.98  CALCIUM 8.9   < > 9.3  --  9.3 9.0  GFRNONAA 53*   < > >60  --  >60 53*  GFRAA >60   < > >60  --  >60 >60  PROT 6.7  --   --   --   --  7.1  ALBUMIN 3.4*  --   --   --   --  3.9  AST 49*  --   --   --   --  27  ALT 6*  --   --   --   --  12  ALKPHOS 151*  --   --   --   --  91  BILITOT 0.7  --   --   --   --  0.3  BILIDIR 0.1  --   --   --   --   --   IBILI 0.6  --   --   --   --   --    < > = values in this interval not displayed.   Iron/TIBC/Ferritin/ %Sat No results found for: IRON, TIBC, FERRITIN, IRONPCTSAT   RADIOGRAPHIC STUDIES: I have personally reviewed the radiological images as listed and agreed with the findings in the report. 02/12/2018 CT chest abdomen pelvis with contrast showed no adenopathy or other evidence of metastatic disease in the abdomen or pelvis.  Mild left supraclavicular lymphadenopathy, obscured by streak artifact from left chest pacemaker. Several pulmonary nodules, indeterminate.  Nonspecific asymmetric mild right anal wall thickening and hyperenhancement.  Postsurgical changes. 02/20/2018 No findings identified to suggest a  residual/recurrent tumor or hypermetabolic metastasis.  There is no abnormal hypermetabolic is not identified within the left supraclavicular region.  Distal rectum and anus mild radiotracer uptake.  Nonspecific.  Aortic atherosclerosis.  Multivessel coronary artery atherosclerosis calcification.  Hiatal hernia.  ASSESSMENT & PLAN:  1. Anal cancer ([place])   2. Supraclavicular lymphadenopathy   clinical pT1,cN0 M0. I independently reviewed the patient's CT chest abdomen pelvis and PET scan. Hypermetabolic activity on left supraclavicular region.  Hold biopsy. I had a discussion with patient's cardiologist Dr. Lavera Guise the phone.  Cardiology is concerned about patient's tolerance to chemotherapy. Patient has extensive cardiovascular disease including CAD, history of MI, cardiomyopathy, angina. I agree that patient is not a good candidate for aggressive chemotherapy.  Xeloda concurrent radiation is an option however still otherwise related to exacerbating patient's angina.  I will recommend proceeding with definitive radiation treatments.  Discussed with Dr. Baruch Gouty. Discussed with patient and her daughter.  They are in agreement with the plan.  #Sinusitis she is already on azithromycin treatment.  Advised patient to contact primary care if symptoms not improve after she finished antibiotic. #Anal discharge since surgery.  Return.  We spent sufficient time to discuss many aspect of care, questions were answered to patient's satisfaction.   Orders Placed This Encounter  Procedures  . CBC with Differential/Platelet    Standing Status:   Future    Standing Expiration Date:   02/22/2019  . Comprehensive metabolic panel    Standing Status:   Future    Standing Expiration Date:   02/22/2019    All questions were answered. The patient knows to call the clinic with any problems questions or concerns.  Return of visit: Mid February Total face to face encounter time for this patient visit was 25 min.  >50% of the time was  spent in counseling and coordination of care.    Earlie Server, MD, Hematology Oncology [facility] Pager- [phone] 02/21/2018

## 2018-02-21 NOTE — Progress Notes (Signed)
Radiation Oncology Follow up Note  Name: Kristi [name]   Date:   02/21/2018 MRN:  [id number] DOB: March 19, 1941    This 77 y.o. 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  REFERRING PROVIDER: Lorelee Market, MD  HPI: patient is a 77 year old female originally consult back in November for stage I poorly differentiated squamous cell carcinoma the nus status post transanal excision.margins were clear but close at 1.6 mm deep deep margin and 0.7 mm mucosal margin. No nodes were submitted.she recently had a PET ceased CT scan showing no findings to suggest residual recurrent disease or abdominal adenopathy. She does have some increased radiotracer uptake localized to the distal rectum and anus. She is doing fair. She states she still has some slight discharge from her rectum is currently on a Z-Pak.She also has a history of congestive heart failure.  COMPLICATIONS OF TREATMENT: none  FOLLOW UP COMPLIANCE: keeps appointments   PHYSICAL EXAM:  BP (P) 130/83 (BP Location: Left Arm, Patient Position: Sitting)   Pulse (P) 86   Temp (P) 97.8 F (36.6 C) (Tympanic)   Wt (P) 176 lb 12.9 oz (80.2 kg)   BMI (P) 28.54 kg/m  Anal region appears well healed no evi of drainage otion in the anal caWell-developed well-nourished patient in NAD. HEENT reveals PERLA, EOMI, discs not visualized.  Oral cavity is clear. No oral mucosal lesions are identified. Neck is clear without evidence of cervical or supraclavicular adenopathy. Lungs are clear to A&P. Cardiac examination is essentially unremarkable with regular rate and rhythm without murmur rub or thrill. Abdomen is benign with no organomegaly or masses noted. Motor sensory and DTR levels are equal and symmetric in the upper and lower extremities. Cranial nerves II through XII are grossly intact. Proprioception is intact. No peripheral adenopathy or  edema is identified. No motor or sensory levels are noted. Crude visual fields are within normal range.  RADIOLOGY RESULTS: PET scan reviewed and compatible with the  PLAN: I discussed the case with medical oncology. Based on her poor cardiac status and congestive heart failure they're hesitant to give concurrent chemotherapy with radiation. I would plan on delivering 5000 cGy over 5 weeks to the anus and local regional lymph nodes. Would not treat with whole pelvic radiation based on the early stage of her disease. I believe lymph node involvement of her pelvic nodes is quite low. Risks and benefits of treatment including diarrhea fatigue skin reaction alteration of blood counts possible increased lower urinary tract symptoms all were discussed in detail with the patient. She seems to comprehend my treatment plan well. I personally set up and ordered CT simulation for next week to allow to recur again recover somewhat from her current status.  I would like to take this opportunity to thank you for allowing me to participate in the care of your patient.Noreene Filbert, MD

## 2018-02-21 NOTE — Progress Notes (Signed)
Patient here for follow up. Complains of being nauseated in the mornings.

## 2018-02-27 ENCOUNTER — Ambulatory Visit
Admission: RE | Admit: 2018-02-27 | Discharge: 2018-02-27 | Disposition: A | Discharge status: Home / Self Care | Payer: Medicare Other | Source: Ambulatory Visit | Attending: Radiation Oncology | Admitting: Radiation Oncology

## 2018-02-27 DIAGNOSIS — C211 Malignant neoplasm of anal canal: Secondary | ICD-10-CM | POA: Insufficient documentation

## 2018-02-27 DIAGNOSIS — Z510 Encounter for antineoplastic radiation therapy: Secondary | ICD-10-CM | POA: Insufficient documentation

## 2018-02-28 DIAGNOSIS — Z510 Encounter for antineoplastic radiation therapy: Secondary | ICD-10-CM | POA: Diagnosis not present

## 2018-03-02 ENCOUNTER — Ambulatory Visit (INDEPENDENT_AMBULATORY_CARE_PROVIDER_SITE_OTHER): Payer: Medicare Other | Admitting: Obstetrics and Gynecology

## 2018-03-02 ENCOUNTER — Other Ambulatory Visit: Payer: Self-pay | Admitting: *Deleted

## 2018-03-02 ENCOUNTER — Encounter: Payer: Self-pay | Admitting: Obstetrics and Gynecology

## 2018-03-02 ENCOUNTER — Other Ambulatory Visit (HOSPITAL_COMMUNITY)
Admission: RE | Admit: 2018-03-02 | Discharge: 2018-03-02 | Disposition: A | Discharge status: Home / Self Care | Payer: Medicare Other | Source: Ambulatory Visit | Attending: Obstetrics and Gynecology | Admitting: Obstetrics and Gynecology

## 2018-03-02 VITALS — BP 132/80 | HR 93 | Wt 176.0 lb

## 2018-03-02 DIAGNOSIS — Z124 Encounter for screening for malignant neoplasm of cervix: Secondary | ICD-10-CM | POA: Insufficient documentation

## 2018-03-02 DIAGNOSIS — Z85048 Personal history of other malignant neoplasm of rectum, rectosigmoid junction, and anus: Secondary | ICD-10-CM | POA: Diagnosis present

## 2018-03-02 DIAGNOSIS — I25118 Atherosclerotic heart disease of native coronary artery with other forms of angina pectoris: Secondary | ICD-10-CM | POA: Diagnosis not present

## 2018-03-02 DIAGNOSIS — C211 Malignant neoplasm of anal canal: Secondary | ICD-10-CM

## 2018-03-02 NOTE — Progress Notes (Signed)
Gynecology Annual Exam  PCP: Lorelee Market, MD  Chief Complaint:  Chief Complaint  Patient presents with  . Gynecologic Exam    History of Present Illness:Patient is a 77 y.o. No obstetric history on file. presents for annual exam. The patient was recently diagnosed with anal/rectal cancer, is about to undergo XRT.  LMP: No LMP recorded. Patient has had a hysterectomy.    The patient does perform self breast exams.  There is no notable family history of breast or ovarian cancer in her family.  The patient wears seatbelts: yes.   The patient has regular exercise: not asked.     Review of Systems: Review of Systems  Constitutional: Negative.   Gastrointestinal: Negative.   Genitourinary: Negative.     Past Medical History:  Past Medical History:  Diagnosis Date  . Anxiety   . Arthritis   . Broken arm    left FA 3/17  . Broken arm    left  . CAD (coronary artery disease)    s/p MI  . Cancer ([place])    uterine  . Cervical disc disorder   . Depression   . Diabetes mellitus without complication ([place])   . GERD (gastroesophageal reflux disease)   . Headache   . Hyperlipidemia   . Hypertension   . Hypothyroid   . Myocardial infarction ([place])    15 year ago  . Neck pain 06/04/2014  . Parkinson's disease ([place])   . Presence of permanent cardiac pacemaker   . Spinal stenosis     Past Surgical History:  Past Surgical History:  Procedure Laterality Date  . ABDOMINAL HYSTERECTOMY     partial  . BREAST SURGERY    . COLONOSCOPY    . COLONOSCOPY WITH PROPOFOL N/A 11/29/2017   Procedure: COLONOSCOPY WITH PROPOFOL;  Surgeon: [name], MD;  Location: [facility] ENDOSCOPY;  Service: Endoscopy;  Laterality: N/A;  . CORONARY ANGIOPLASTY     stent  . CORONARY ARTERY BYPASS GRAFT    . CORONARY STENT PLACEMENT    . ESOPHAGOGASTRODUODENOSCOPY (EGD) WITH PROPOFOL N/A 04/18/2017   Procedure: ESOPHAGOGASTRODUODENOSCOPY (EGD) WITH PROPOFOL;  Surgeon: [name], MD;   Location: [facility] ENDOSCOPY;  Service: Endoscopy;  Laterality: N/A;  . ESOPHAGOGASTRODUODENOSCOPY (EGD) WITH PROPOFOL N/A 11/29/2017   Procedure: ESOPHAGOGASTRODUODENOSCOPY (EGD) WITH PROPOFOL;  Surgeon: [name], MD;  Location: [facility] ENDOSCOPY;  Service: Endoscopy;  Laterality: N/A;  . INSERT / REPLACE / REMOVE PACEMAKER    . s/p pacer insertion    . TRANSANAL EXCISION OF RECTAL MASS N/A 01/17/2018   Procedure: TRANSANAL EXCISION OF RECTAL POLYP;  Surgeon: [name], MD;  Location: [facility] ORS;  Service: General;  Laterality: N/A;    Gynecologic History:  No LMP recorded. Patient has had a hysterectomy. Last Pap: Results were: unknown  Obstetric History: No obstetric history on file.  Family History:  Family History  Problem Relation Age of Onset  . Cancer Mother   . Depression Mother   . Diabetes Mother   . Hypertension Mother   . Aneurysm Mother   . Heart disease Father   . Diabetes Sister   . Diabetes Brother   . Diabetes Brother     Social History:  Social History   Socioeconomic History  . Marital status: Widowed    Spouse name: Not on file  . Number of children: Not on file  . Years of education: Not on file  . Highest education level: Not on file  Occupational History  .  Occupation: retired  Scientific laboratory technician  . Financial resource strain: Not on file  . Food insecurity:    Worry: Not on file    Inability: Not on file  . Transportation needs:    Medical: Not on file    Non-medical: Not on file  Tobacco Use  . Smoking status: Never Smoker  . Smokeless tobacco: Never Used  Substance and Sexual Activity  . Alcohol use: No  . Drug use: No  . Sexual activity: Never  Lifestyle  . Physical activity:    Days per week: Not on file    Minutes per session: Not on file  . Stress: Not on file  Relationships  . Social connections:    Talks on phone: Not on file    Gets together: Not on file    Attends religious service: Not on file    Active member of  club or organization: Not on file    Attends meetings of clubs or organizations: Not on file    Relationship status: Not on file  . Intimate partner violence:    Fear of current or ex partner: Not on file    Emotionally abused: Not on file    Physically abused: Not on file    Forced sexual activity: Not on file  Other Topics Concern  . Not on file  Social History Narrative  . Not on file    Allergies:  Allergies  Allergen Reactions  . Penicillins Anaphylaxis  . Penicillin G Swelling and Rash    Has patient had a PCN reaction causing immediate rash, facial/tongue/throat swelling, SOB or lightheadedness with hypotension: Yes Has patient had a PCN reaction causing severe rash involving mucus membranes or skin necrosis: No Has patient had a PCN reaction that required hospitalization: No Has patient had a PCN reaction occurring within the last 10 years: No If all of the above answers are "NO", then may proceed with Cephalosporin use.     Medications: Prior to Admission medications   Medication Sig Start Date End Date Taking? Authorizing Provider  aspirin EC 81 MG tablet Take 81 mg by mouth daily.    [provider]  azithromycin (ZITHROMAX) 250 MG tablet Take 250 mg by mouth daily. 02/19/18   [provider]  capecitabine (XELODA) 500 MG tablet Take 3 tablets (1,500 mg total) by mouth 2 (two) times daily after a meal. Take Monday through Friday only on days of radiation. Patient not taking: Reported on 02/21/2018 02/06/18   Earlie Server, MD  carbidopa-levodopa (SINEMET IR) 25-100 MG tablet Take 1 tablet by mouth 3 (three) times daily.  03/08/17   [provider]  carvedilol (COREG) 3.125 MG tablet Take 1 tablet (3.125 mg total) by mouth 2 (two) times daily with a meal. Patient taking differently: Take 3.125 mg by mouth daily.  04/18/17   Gladstone Lighter, MD  docusate sodium (COLACE) 100 MG capsule Take 100 mg by mouth 2 (two) times daily.    [provider]  furosemide (LASIX) 40 MG tablet Take 40 mg by mouth daily.  05/29/17   [provider]  gabapentin (NEURONTIN) 300 MG capsule Take 300-600 mg by mouth See admin instructions. Take 300 mg by mouth and 600 mg at bedtime 04/24/17   [provider]  glimepiride (AMARYL) 1 MG tablet Take 1 tablet (1 mg total) by mouth daily with breakfast. 04/19/17   Gladstone Lighter, MD  HYDROcodone-acetaminophen (NORCO/VICODIN) 5-325 MG tablet Take 1 tablet by mouth every 4 (four) hours  as needed for moderate pain. Patient not taking: Reported on 02/21/2018 01/17/18 01/17/19  [name], MD  hydrOXYzine (ATARAX/VISTARIL) 25 MG tablet Take 25 mg by mouth daily.    [provider]  levothyroxine (SYNTHROID, LEVOTHROID) 137 MCG tablet  02/14/18   [provider]  Linaclotide (LINZESS) 145 MCG CAPS capsule Take 145 mcg by mouth daily.    [provider]  loratadine (CLARITIN) 10 MG tablet Take 10 mg by mouth daily.    [provider]  meclizine (ANTIVERT) 12.5 MG tablet  02/19/18   [provider]  mupirocin ointment (BACTROBAN) 2 %  01/16/18   [provider]  nitroGLYCERIN (NITROSTAT) 0.4 MG SL tablet Place 0.4 mg under the tongue every 5 (five) minutes as needed for chest pain.  10/03/16   [provider]  omeprazole (PRILOSEC) 20 MG capsule Take 20 mg by mouth 2 (two) times daily before a meal.     [provider]  ondansetron (ZOFRAN-ODT) 4 MG disintegrating tablet  02/02/18   [provider]  potassium chloride SA (K-DUR,KLOR-CON) 20 MEQ tablet Take 20 mEq by mouth 2 (two) times daily.  06/29/17   [provider]  rosuvastatin (CRESTOR) 20 MG tablet Take 20 mg by mouth daily.    [provider]  sertraline (ZOLOFT) 100 MG tablet Take 50-100 mg by mouth See admin instructions. Take 100 mg in the am and 50 mg at bedtime.    [provider]  sucralfate (CARAFATE) 1 g tablet  Take 1 g by mouth 3 (three) times daily.    [provider]    Physical Exam Vitals: Blood pressure 132/80, pulse 93, weight 176 lb (79.8 kg).  General: NAD HEENT: normocephalic, anicteric Thyroid: no enlargement, no palpable nodules Pulmonary: No increased work of breathing, CTAB Genitourinary:  External: Normal external female genitalia.  Normal urethral meatus, normal Bartholin's and Skene's glands.    Vagina: Normal vaginal mucosa, no evidence of prolapse.    Cervix: surgically absent  Uterus: surgically absent  Adnexa: ovaries non-enlarged, no adnexal masses  Rectal: deferred  Lymphatic: no evidence of inguinal lymphadenopathy Extremities: no edema, erythema, or tenderness Neurologic: Grossly intact Psychiatric: mood appropriate, affect full  Female chaperone present for pelvic and breast  portions of the physical exam     Assessment: 77 y.o. No obstetric history on file. routine annual exam  Plan: Problem List Items Addressed This Visit    None    Visit Diagnoses    Screening for malignant neoplasm of cervix    -  Primary   Relevant Orders   Cytology - PAP   History of anal cancer       Relevant Orders   Cytology - PAP      1) ) ASCCP guidelines and rational discussed.  Given correlation with anal cancers and HPV and since no other paps on record pap obtained today  2) Routine healthcare maintenance including cholesterol, diabetes screening discussed managed by PCP  3) Return in about 1 year (around 03/03/2019) for annual.    Malachy Mood, MD Mosetta Pigeon, [facility] 03/06/2018, 7:54 AM           Pap today given correlation of HPV with anal carcinoma

## 2018-03-06 ENCOUNTER — Ambulatory Visit
Admission: RE | Admit: 2018-03-06 | Discharge: 2018-03-06 | Disposition: A | Discharge status: Home / Self Care | Payer: Medicare Other | Source: Ambulatory Visit | Attending: Radiation Oncology | Admitting: Radiation Oncology

## 2018-03-06 DIAGNOSIS — Z510 Encounter for antineoplastic radiation therapy: Secondary | ICD-10-CM | POA: Diagnosis not present

## 2018-03-07 ENCOUNTER — Ambulatory Visit
Admission: RE | Admit: 2018-03-07 | Discharge: 2018-03-07 | Disposition: A | Discharge status: Home / Self Care | Payer: Medicare Other | Source: Ambulatory Visit | Attending: Radiation Oncology | Admitting: Radiation Oncology

## 2018-03-07 DIAGNOSIS — Z510 Encounter for antineoplastic radiation therapy: Secondary | ICD-10-CM | POA: Diagnosis not present

## 2018-03-07 LAB — CYTOLOGY - PAP: Diagnosis: NEGATIVE

## 2018-03-08 ENCOUNTER — Ambulatory Visit
Admission: RE | Admit: 2018-03-08 | Discharge: 2018-03-08 | Disposition: A | Discharge status: Home / Self Care | Payer: Medicare Other | Source: Ambulatory Visit | Attending: Radiation Oncology | Admitting: Radiation Oncology

## 2018-03-08 ENCOUNTER — Ambulatory Visit (INDEPENDENT_AMBULATORY_CARE_PROVIDER_SITE_OTHER): Payer: Medicare Other | Admitting: General Surgery

## 2018-03-08 ENCOUNTER — Other Ambulatory Visit: Payer: Self-pay

## 2018-03-08 VITALS — BP 122/84 | HR 85 | Resp 14 | Ht 66.0 in | Wt 175.0 lb

## 2018-03-08 DIAGNOSIS — Z510 Encounter for antineoplastic radiation therapy: Secondary | ICD-10-CM | POA: Diagnosis not present

## 2018-03-08 DIAGNOSIS — C211 Malignant neoplasm of anal canal: Secondary | ICD-10-CM

## 2018-03-08 NOTE — Patient Instructions (Signed)
May stop stool softeners and use Fiber suplements daily The patient is aware to call back for any questions or new concerns.

## 2018-03-08 NOTE — Progress Notes (Signed)
Patient ID: Kristi [name], female   DOB: Jun 30, 1940, 77 y.o.   MRN: [id number]  Chief Complaint  Patient presents with  . Follow-up    HPI Kristi [name] is a 77 y.o. female.  Here for follow up anal cancer. Sh estates she started radiation yesterday. She states some days her bowels are alternating between loose and  constipated.  The patient makes use of Linzess as well as a stool softener.  HPI  Past Medical History:  Diagnosis Date  . Anxiety   . Arthritis   . Broken arm    left FA 3/17  . Broken arm    left  . CAD (coronary artery disease)    s/p MI  . Cancer ([place])    uterine  . Cervical disc disorder   . Depression   . Diabetes mellitus without complication ([place])   . GERD (gastroesophageal reflux disease)   . Headache   . Hyperlipidemia   . Hypertension   . Hypothyroid   . Myocardial infarction ([place])    15 year ago  . Neck pain 06/04/2014  . Parkinson's disease ([place])   . Presence of permanent cardiac pacemaker   . Spinal stenosis     Past Surgical History:  Procedure Laterality Date  . ABDOMINAL HYSTERECTOMY     partial  . BREAST SURGERY    . COLONOSCOPY    . COLONOSCOPY WITH PROPOFOL N/A 11/29/2017   Procedure: COLONOSCOPY WITH PROPOFOL;  Surgeon: [name], MD;  Location: [facility] ENDOSCOPY;  Service: Endoscopy;  Laterality: N/A;  . CORONARY ANGIOPLASTY     stent  . CORONARY ARTERY BYPASS GRAFT    . CORONARY STENT PLACEMENT    . ESOPHAGOGASTRODUODENOSCOPY (EGD) WITH PROPOFOL N/A 04/18/2017   Procedure: ESOPHAGOGASTRODUODENOSCOPY (EGD) WITH PROPOFOL;  Surgeon: [name], MD;  Location: [facility] ENDOSCOPY;  Service: Endoscopy;  Laterality: N/A;  . ESOPHAGOGASTRODUODENOSCOPY (EGD) WITH PROPOFOL N/A 11/29/2017   Procedure: ESOPHAGOGASTRODUODENOSCOPY (EGD) WITH PROPOFOL;  Surgeon: [name], MD;  Location: [facility] ENDOSCOPY;  Service: Endoscopy;  Laterality: N/A;  . INSERT / REPLACE / REMOVE PACEMAKER    . s/p pacer insertion    . TRANSANAL EXCISION  OF RECTAL MASS N/A 01/17/2018   Procedure: TRANSANAL EXCISION OF RECTAL POLYP;  Surgeon: [name], MD;  Location: [facility] ORS;  Service: General;  Laterality: N/A;    Family History  Problem Relation Age of Onset  . Cancer Mother   . Depression Mother   . Diabetes Mother   . Hypertension Mother   . Aneurysm Mother   . Heart disease Father   . Diabetes Sister   . Diabetes Brother   . Diabetes Brother     Social History Social History   Tobacco Use  . Smoking status: Never Smoker  . Smokeless tobacco: Never Used  Substance Use Topics  . Alcohol use: No  . Drug use: No    Allergies  Allergen Reactions  . Penicillins Anaphylaxis  . Penicillin G Swelling and Rash    Has patient had a PCN reaction causing immediate rash, facial/tongue/throat swelling, SOB or lightheadedness with hypotension: Yes Has patient had a PCN reaction causing severe rash involving mucus membranes or skin necrosis: No Has patient had a PCN reaction that required hospitalization: No Has patient had a PCN reaction occurring within the last 10 years: No If all of the above answers are "NO", then may proceed with Cephalosporin use.     Current Outpatient Medications  Medication Sig Dispense Refill  .  aspirin EC 81 MG tablet Take 81 mg by mouth daily.    . carbidopa-levodopa (SINEMET IR) 25-100 MG tablet Take 1 tablet by mouth 3 (three) times daily.     . carvedilol (COREG) 3.125 MG tablet Take 1 tablet (3.125 mg total) by mouth 2 (two) times daily with a meal. (Patient taking differently: Take 3.125 mg by mouth daily. ) 60 tablet 2  . docusate sodium (COLACE) 100 MG capsule Take 100 mg by mouth 2 (two) times daily.    . furosemide (LASIX) 40 MG tablet Take 40 mg by mouth daily.     [name] gabapentin (NEURONTIN) 300 MG capsule Take 300-600 mg by mouth See admin instructions. Take 300 mg by mouth and 600 mg at bedtime    . glimepiride (AMARYL) 1 MG tablet Take 1 tablet (1 mg total) by mouth daily with  breakfast. 30 tablet 2  . hydrOXYzine (ATARAX/VISTARIL) 25 MG tablet Take 25 mg by mouth daily.    [name] levothyroxine (SYNTHROID, LEVOTHROID) 137 MCG tablet     . Linaclotide (LINZESS) 145 MCG CAPS capsule Take 145 mcg by mouth daily.    [name] loratadine (CLARITIN) 10 MG tablet Take 10 mg by mouth daily.    . meclizine (ANTIVERT) 12.5 MG tablet     . mupirocin ointment (BACTROBAN) 2 %     . nitroGLYCERIN (NITROSTAT) 0.4 MG SL tablet Place 0.4 mg under the tongue every 5 (five) minutes as needed for chest pain.     [name] omeprazole (PRILOSEC) 20 MG capsule Take 20 mg by mouth 2 (two) times daily before a meal.     . ondansetron (ZOFRAN-ODT) 4 MG disintegrating tablet     . potassium chloride SA (K-DUR,KLOR-CON) 20 MEQ tablet Take 20 mEq by mouth 2 (two) times daily.     . rosuvastatin (CRESTOR) 20 MG tablet Take 20 mg by mouth daily.    . sertraline (ZOLOFT) 100 MG tablet Take 50-100 mg by mouth See admin instructions. Take 100 mg in the am and 50 mg at bedtime.    . sucralfate (CARAFATE) 1 g tablet Take 1 g by mouth 3 (three) times daily.    . capecitabine (XELODA) 500 MG tablet Take 3 tablets (1,500 mg total) by mouth 2 (two) times daily after a meal. Take Monday through Friday only on days of radiation. (Patient not taking: Reported on 02/21/2018) 120 tablet 0   No current facility-administered medications for this visit.     Review of Systems Review of Systems  Constitutional: Negative.   Respiratory: Negative.     Blood pressure 122/84, pulse 85, resp. rate 14, height 5\' 6"  (1.676 m), weight 175 lb (79.4 kg), SpO2 97 %.  Physical Exam Physical Exam Genitourinary:   Skin:    General: Skin is warm and dry.  Neurological:     Mental Status: She is alert and oriented to person, place, and time.  Psychiatric:        Mood and Affect: Mood normal.     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    Plan    May stop stool softeners and use Fiber  suplements daily The patient is aware to call back for any questions or new concerns. Follow up in 2 months.     HPI, Physical Exam, Assessment and Plan have been scribed under the direction and in the presence of [name], MD. Karie Fetch, RN  I have completed the exam and reviewed  the above documentation for accuracy and completeness.  I agree with the above.  Haematologist has been used and any errors in dictation or transcription are unintentional.  Hervey Ard, M.D., F.A.C.S.  Karie Fetch M 03/08/2018, 2:19 PM

## 2018-03-09 ENCOUNTER — Ambulatory Visit
Admission: RE | Admit: 2018-03-09 | Discharge: 2018-03-09 | Disposition: A | Discharge status: Home / Self Care | Payer: Medicare Other | Source: Ambulatory Visit | Attending: Radiation Oncology | Admitting: Radiation Oncology

## 2018-03-09 DIAGNOSIS — Z510 Encounter for antineoplastic radiation therapy: Secondary | ICD-10-CM | POA: Diagnosis not present

## 2018-03-12 ENCOUNTER — Ambulatory Visit
Admission: RE | Admit: 2018-03-12 | Discharge: 2018-03-12 | Disposition: A | Discharge status: Home / Self Care | Payer: Medicare Other | Source: Ambulatory Visit | Attending: Radiation Oncology | Admitting: Radiation Oncology

## 2018-03-12 DIAGNOSIS — Z510 Encounter for antineoplastic radiation therapy: Secondary | ICD-10-CM | POA: Diagnosis not present

## 2018-03-13 ENCOUNTER — Ambulatory Visit
Admission: RE | Admit: 2018-03-13 | Discharge: 2018-03-13 | Disposition: A | Discharge status: Home / Self Care | Payer: Medicare Other | Source: Ambulatory Visit | Attending: Radiation Oncology | Admitting: Radiation Oncology

## 2018-03-13 DIAGNOSIS — Z510 Encounter for antineoplastic radiation therapy: Secondary | ICD-10-CM | POA: Diagnosis not present

## 2018-03-15 ENCOUNTER — Ambulatory Visit
Admission: RE | Admit: 2018-03-15 | Discharge: 2018-03-15 | Disposition: A | Discharge status: Home / Self Care | Payer: Medicare Other | Source: Ambulatory Visit | Attending: Radiation Oncology | Admitting: Radiation Oncology

## 2018-03-15 DIAGNOSIS — Z510 Encounter for antineoplastic radiation therapy: Secondary | ICD-10-CM | POA: Diagnosis not present

## 2018-03-16 ENCOUNTER — Ambulatory Visit
Admission: RE | Admit: 2018-03-16 | Discharge: 2018-03-16 | Disposition: A | Discharge status: Home / Self Care | Payer: Medicare Other | Source: Ambulatory Visit | Attending: Radiation Oncology | Admitting: Radiation Oncology

## 2018-03-16 DIAGNOSIS — Z510 Encounter for antineoplastic radiation therapy: Secondary | ICD-10-CM | POA: Diagnosis not present

## 2018-03-19 ENCOUNTER — Inpatient Hospital Stay: Payer: Medicare Other

## 2018-03-19 ENCOUNTER — Ambulatory Visit
Admission: RE | Admit: 2018-03-19 | Discharge: 2018-03-19 | Disposition: A | Discharge status: Home / Self Care | Payer: Medicare Other | Source: Ambulatory Visit | Attending: Radiation Oncology | Admitting: Radiation Oncology

## 2018-03-19 DIAGNOSIS — Z510 Encounter for antineoplastic radiation therapy: Secondary | ICD-10-CM | POA: Diagnosis not present

## 2018-03-20 ENCOUNTER — Ambulatory Visit
Admission: RE | Admit: 2018-03-20 | Discharge: 2018-03-20 | Disposition: A | Discharge status: Home / Self Care | Payer: Medicare Other | Source: Ambulatory Visit | Attending: Radiation Oncology | Admitting: Radiation Oncology

## 2018-03-20 DIAGNOSIS — Z510 Encounter for antineoplastic radiation therapy: Secondary | ICD-10-CM | POA: Diagnosis not present

## 2018-03-22 ENCOUNTER — Ambulatory Visit
Admission: RE | Admit: 2018-03-22 | Discharge: 2018-03-22 | Disposition: A | Discharge status: Home / Self Care | Payer: Medicare Other | Source: Ambulatory Visit | Attending: Radiation Oncology | Admitting: Radiation Oncology

## 2018-03-22 DIAGNOSIS — C211 Malignant neoplasm of anal canal: Secondary | ICD-10-CM | POA: Diagnosis not present

## 2018-03-22 DIAGNOSIS — Z510 Encounter for antineoplastic radiation therapy: Secondary | ICD-10-CM | POA: Insufficient documentation

## 2018-03-23 ENCOUNTER — Ambulatory Visit
Admission: RE | Admit: 2018-03-23 | Discharge: 2018-03-23 | Disposition: A | Discharge status: Home / Self Care | Payer: Medicare Other | Source: Ambulatory Visit | Attending: Radiation Oncology | Admitting: Radiation Oncology

## 2018-03-23 DIAGNOSIS — Z510 Encounter for antineoplastic radiation therapy: Secondary | ICD-10-CM | POA: Diagnosis not present

## 2018-03-26 ENCOUNTER — Ambulatory Visit
Admission: RE | Admit: 2018-03-26 | Discharge: 2018-03-26 | Disposition: A | Discharge status: Home / Self Care | Payer: Medicare Other | Source: Ambulatory Visit | Attending: Radiation Oncology | Admitting: Radiation Oncology

## 2018-03-26 ENCOUNTER — Inpatient Hospital Stay: Payer: Medicare Other | Attending: Oncology

## 2018-03-26 DIAGNOSIS — Z510 Encounter for antineoplastic radiation therapy: Secondary | ICD-10-CM | POA: Diagnosis not present

## 2018-03-26 DIAGNOSIS — C210 Malignant neoplasm of anus, unspecified: Secondary | ICD-10-CM | POA: Insufficient documentation

## 2018-03-27 ENCOUNTER — Ambulatory Visit
Admission: RE | Admit: 2018-03-27 | Discharge: 2018-03-27 | Disposition: A | Discharge status: Home / Self Care | Payer: Medicare Other | Source: Ambulatory Visit | Attending: Radiation Oncology | Admitting: Radiation Oncology

## 2018-03-27 DIAGNOSIS — Z510 Encounter for antineoplastic radiation therapy: Secondary | ICD-10-CM | POA: Diagnosis not present

## 2018-03-28 ENCOUNTER — Ambulatory Visit
Admission: RE | Admit: 2018-03-28 | Discharge: 2018-03-28 | Disposition: A | Discharge status: Home / Self Care | Payer: Medicare Other | Source: Ambulatory Visit | Attending: Radiation Oncology | Admitting: Radiation Oncology

## 2018-03-28 DIAGNOSIS — Z510 Encounter for antineoplastic radiation therapy: Secondary | ICD-10-CM | POA: Diagnosis not present

## 2018-03-29 ENCOUNTER — Ambulatory Visit
Admission: RE | Admit: 2018-03-29 | Discharge: 2018-03-29 | Disposition: A | Discharge status: Home / Self Care | Payer: Medicare Other | Source: Ambulatory Visit | Attending: Radiation Oncology | Admitting: Radiation Oncology

## 2018-03-29 DIAGNOSIS — Z510 Encounter for antineoplastic radiation therapy: Secondary | ICD-10-CM | POA: Diagnosis not present

## 2018-03-30 ENCOUNTER — Ambulatory Visit
Admission: RE | Admit: 2018-03-30 | Discharge: 2018-03-30 | Disposition: A | Discharge status: Home / Self Care | Payer: Medicare Other | Source: Ambulatory Visit | Attending: Radiation Oncology | Admitting: Radiation Oncology

## 2018-03-30 DIAGNOSIS — Z510 Encounter for antineoplastic radiation therapy: Secondary | ICD-10-CM | POA: Diagnosis not present

## 2018-04-02 ENCOUNTER — Ambulatory Visit
Admission: RE | Admit: 2018-04-02 | Discharge: 2018-04-02 | Disposition: A | Discharge status: Home / Self Care | Payer: Medicare Other | Source: Ambulatory Visit | Attending: Radiation Oncology | Admitting: Radiation Oncology

## 2018-04-02 ENCOUNTER — Inpatient Hospital Stay: Payer: Medicare Other

## 2018-04-02 ENCOUNTER — Encounter (INDEPENDENT_AMBULATORY_CARE_PROVIDER_SITE_OTHER): Payer: Self-pay

## 2018-04-02 DIAGNOSIS — C210 Malignant neoplasm of anus, unspecified: Secondary | ICD-10-CM | POA: Diagnosis present

## 2018-04-02 DIAGNOSIS — C211 Malignant neoplasm of anal canal: Secondary | ICD-10-CM

## 2018-04-02 DIAGNOSIS — Z510 Encounter for antineoplastic radiation therapy: Secondary | ICD-10-CM | POA: Diagnosis not present

## 2018-04-02 LAB — CBC
HCT: 36.4 % (ref 36.0–46.0)
Hemoglobin: 11.4 g/dL — ABNORMAL LOW (ref 12.0–15.0)
MCH: 28.9 pg (ref 26.0–34.0)
MCHC: 31.3 g/dL (ref 30.0–36.0)
MCV: 92.4 fL (ref 80.0–100.0)
Platelets: 134 10*3/uL — ABNORMAL LOW (ref 150–400)
RBC: 3.94 MIL/uL (ref 3.87–5.11)
RDW: 15 % (ref 11.5–15.5)
WBC: 4.8 10*3/uL (ref 4.0–10.5)
nRBC: 0 % (ref 0.0–0.2)

## 2018-04-02 IMAGING — CR DG RIBS W/ CHEST 3+V*R*
5 series · 5 of 5 positions shown · non-contrast
Comparison: Chest radiograph 09/29/2016

CLINICAL DATA: Fall with rib pain

EXAM:
RIGHT RIBS AND CHEST - 3+ VIEW

[chest pa]
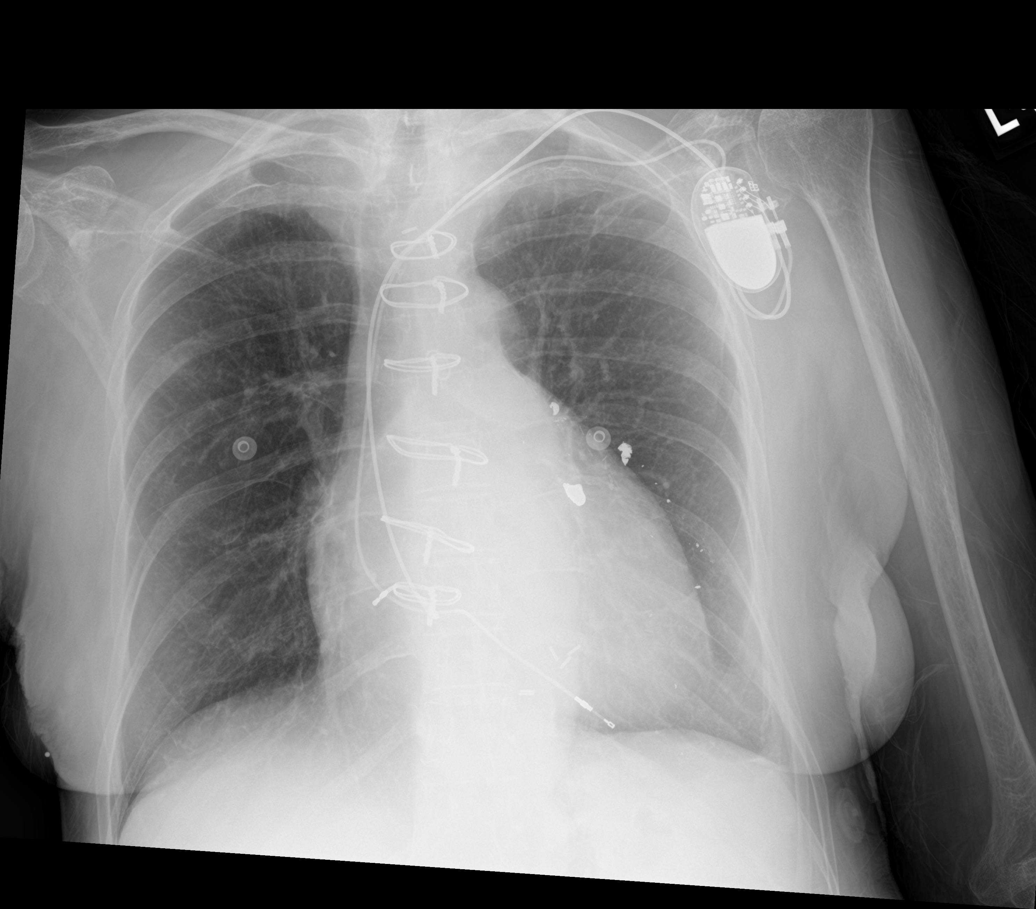

[rib ap]
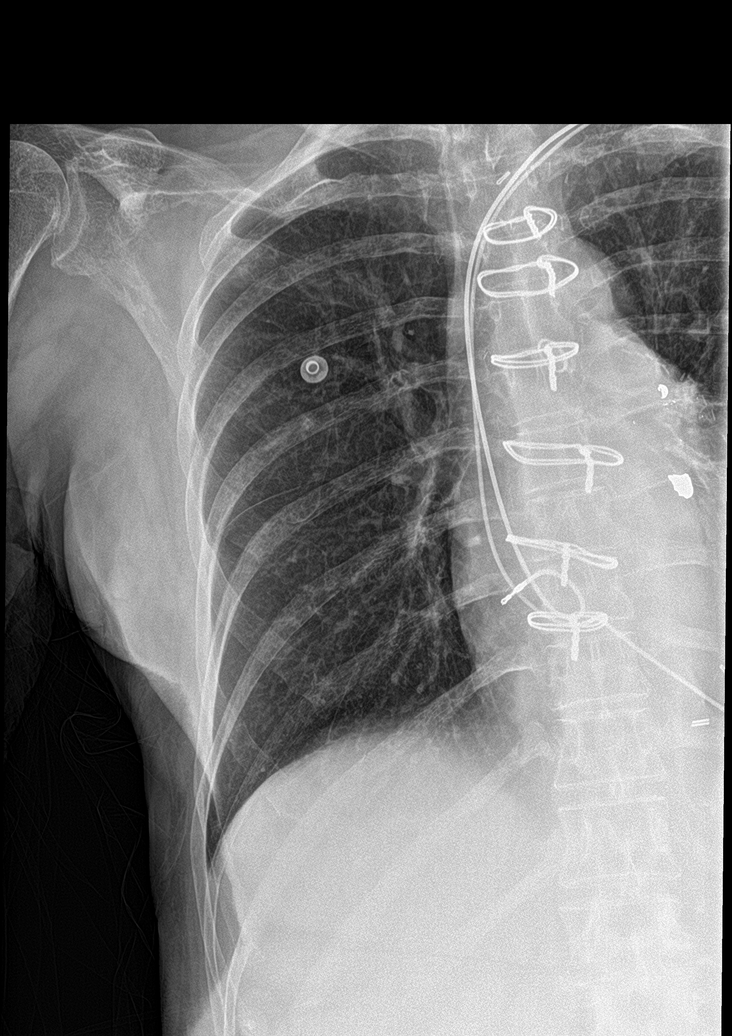

[rib ap obl (1 of 3)]
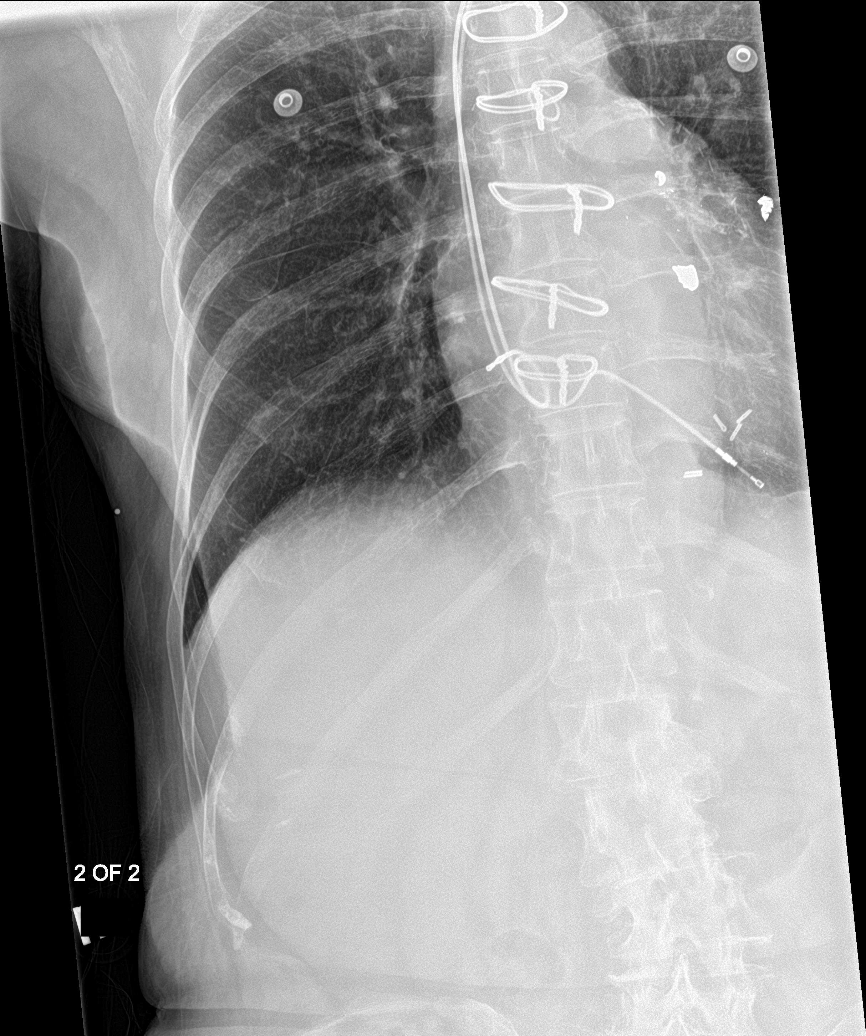

[rib ap obl (2 of 3)]
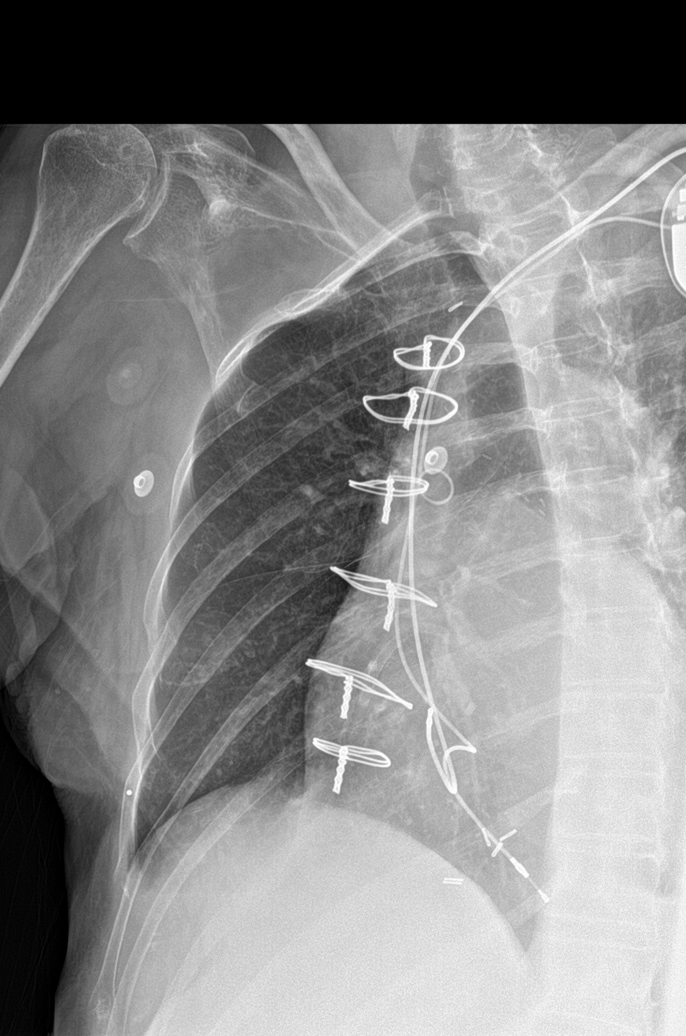

[rib ap obl (3 of 3)]
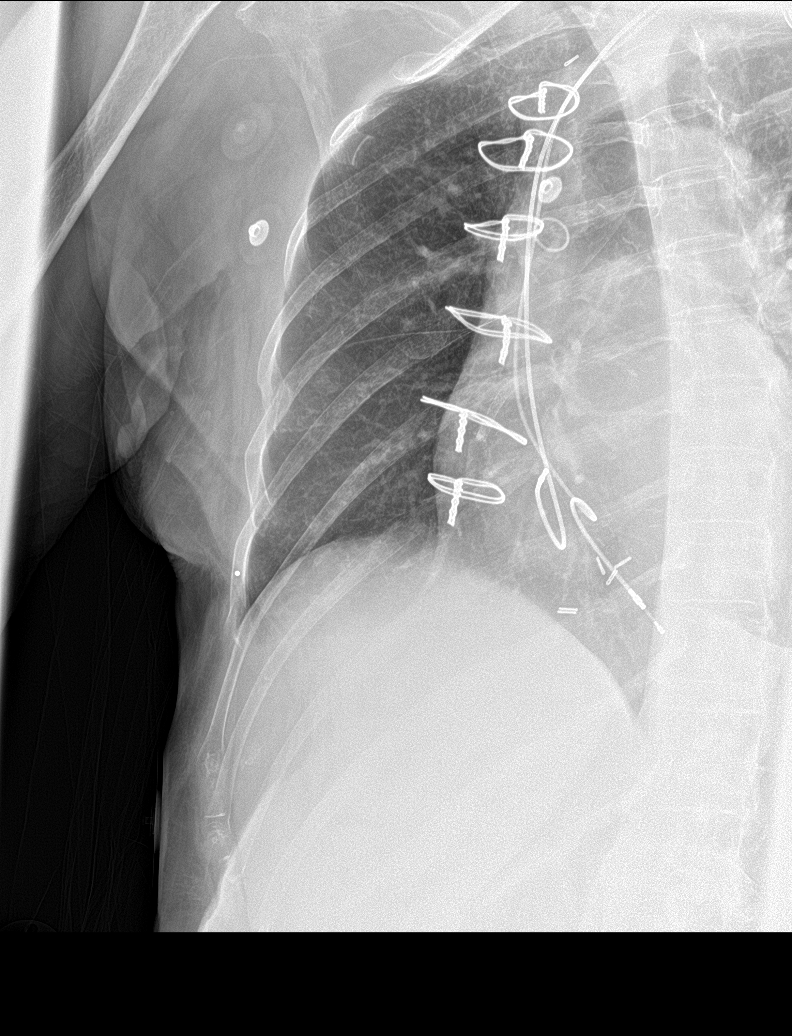

[5 of 5 positions shown; findings below may reference images not displayed]

FINDINGS: Left chest wall pacemaker leads are in unchanged position. There is
shrapnel overlying the left chest. Unchanged cardiomediastinal
contours. The lungs are clear. There is no rib fracture.
IMPRESSION: No rib fracture.  Clear lungs.

## 2018-04-03 ENCOUNTER — Ambulatory Visit
Admission: RE | Admit: 2018-04-03 | Discharge: 2018-04-03 | Disposition: A | Discharge status: Home / Self Care | Payer: Medicare Other | Source: Ambulatory Visit | Attending: Radiation Oncology | Admitting: Radiation Oncology

## 2018-04-03 DIAGNOSIS — Z510 Encounter for antineoplastic radiation therapy: Secondary | ICD-10-CM | POA: Diagnosis not present

## 2018-04-04 ENCOUNTER — Ambulatory Visit
Admission: RE | Admit: 2018-04-04 | Discharge: 2018-04-04 | Disposition: A | Discharge status: Home / Self Care | Payer: Medicare Other | Source: Ambulatory Visit | Attending: Radiation Oncology | Admitting: Radiation Oncology

## 2018-04-04 DIAGNOSIS — Z510 Encounter for antineoplastic radiation therapy: Secondary | ICD-10-CM | POA: Diagnosis not present

## 2018-04-05 ENCOUNTER — Ambulatory Visit
Admission: RE | Admit: 2018-04-05 | Discharge: 2018-04-05 | Disposition: A | Discharge status: Home / Self Care | Payer: Medicare Other | Source: Ambulatory Visit | Attending: Radiation Oncology | Admitting: Radiation Oncology

## 2018-04-05 DIAGNOSIS — Z510 Encounter for antineoplastic radiation therapy: Secondary | ICD-10-CM | POA: Diagnosis not present

## 2018-04-06 ENCOUNTER — Ambulatory Visit
Admission: RE | Admit: 2018-04-06 | Discharge: 2018-04-06 | Disposition: A | Discharge status: Home / Self Care | Payer: Medicare Other | Source: Ambulatory Visit | Attending: Radiation Oncology | Admitting: Radiation Oncology

## 2018-04-06 DIAGNOSIS — Z510 Encounter for antineoplastic radiation therapy: Secondary | ICD-10-CM | POA: Diagnosis not present

## 2018-04-09 ENCOUNTER — Ambulatory Visit
Admission: RE | Admit: 2018-04-09 | Discharge: 2018-04-09 | Disposition: A | Discharge status: Home / Self Care | Payer: Medicare Other | Source: Ambulatory Visit | Attending: Radiation Oncology | Admitting: Radiation Oncology

## 2018-04-09 ENCOUNTER — Inpatient Hospital Stay: Payer: Medicare Other

## 2018-04-09 DIAGNOSIS — C211 Malignant neoplasm of anal canal: Secondary | ICD-10-CM

## 2018-04-09 DIAGNOSIS — C210 Malignant neoplasm of anus, unspecified: Secondary | ICD-10-CM | POA: Diagnosis not present

## 2018-04-09 DIAGNOSIS — Z510 Encounter for antineoplastic radiation therapy: Secondary | ICD-10-CM | POA: Diagnosis not present

## 2018-04-09 LAB — CBC
HCT: 37.6 % (ref 36.0–46.0)
Hemoglobin: 11.7 g/dL — ABNORMAL LOW (ref 12.0–15.0)
MCH: 28.8 pg (ref 26.0–34.0)
MCHC: 31.1 g/dL (ref 30.0–36.0)
MCV: 92.6 fL (ref 80.0–100.0)
Platelets: 161 10*3/uL (ref 150–400)
RBC: 4.06 MIL/uL (ref 3.87–5.11)
RDW: 15.1 % (ref 11.5–15.5)
WBC: 5.2 10*3/uL (ref 4.0–10.5)
nRBC: 0 % (ref 0.0–0.2)

## 2018-04-10 ENCOUNTER — Ambulatory Visit
Admission: RE | Admit: 2018-04-10 | Discharge: 2018-04-10 | Disposition: A | Discharge status: Home / Self Care | Payer: Medicare Other | Source: Ambulatory Visit | Attending: Radiation Oncology | Admitting: Radiation Oncology

## 2018-04-10 DIAGNOSIS — Z510 Encounter for antineoplastic radiation therapy: Secondary | ICD-10-CM | POA: Diagnosis not present

## 2018-04-11 ENCOUNTER — Ambulatory Visit
Admission: RE | Admit: 2018-04-11 | Discharge: 2018-04-11 | Disposition: A | Discharge status: Home / Self Care | Payer: Medicare Other | Source: Ambulatory Visit | Attending: Radiation Oncology | Admitting: Radiation Oncology

## 2018-04-11 DIAGNOSIS — Z510 Encounter for antineoplastic radiation therapy: Secondary | ICD-10-CM | POA: Diagnosis not present

## 2018-04-12 ENCOUNTER — Ambulatory Visit
Admission: RE | Admit: 2018-04-12 | Discharge: 2018-04-12 | Disposition: A | Discharge status: Home / Self Care | Payer: Medicare Other | Source: Ambulatory Visit | Attending: Radiation Oncology | Admitting: Radiation Oncology

## 2018-04-12 DIAGNOSIS — Z510 Encounter for antineoplastic radiation therapy: Secondary | ICD-10-CM | POA: Diagnosis not present

## 2018-04-13 ENCOUNTER — Ambulatory Visit
Admission: RE | Admit: 2018-04-13 | Discharge: 2018-04-13 | Disposition: A | Discharge status: Home / Self Care | Payer: Medicare Other | Source: Ambulatory Visit | Attending: Radiation Oncology | Admitting: Radiation Oncology

## 2018-04-13 DIAGNOSIS — Z510 Encounter for antineoplastic radiation therapy: Secondary | ICD-10-CM | POA: Diagnosis not present

## 2018-04-16 ENCOUNTER — Ambulatory Visit
Admission: RE | Admit: 2018-04-16 | Discharge: 2018-04-16 | Disposition: A | Discharge status: Home / Self Care | Payer: Medicare Other | Source: Ambulatory Visit | Attending: Radiation Oncology | Admitting: Radiation Oncology

## 2018-04-16 DIAGNOSIS — Z510 Encounter for antineoplastic radiation therapy: Secondary | ICD-10-CM | POA: Diagnosis not present

## 2018-04-17 ENCOUNTER — Ambulatory Visit
Admission: RE | Admit: 2018-04-17 | Discharge: 2018-04-17 | Disposition: A | Discharge status: Home / Self Care | Payer: Medicare Other | Source: Ambulatory Visit | Attending: Radiation Oncology | Admitting: Radiation Oncology

## 2018-04-17 DIAGNOSIS — Z510 Encounter for antineoplastic radiation therapy: Secondary | ICD-10-CM | POA: Diagnosis not present

## 2018-05-02 ENCOUNTER — Encounter: Payer: Self-pay | Admitting: Oncology

## 2018-05-02 ENCOUNTER — Other Ambulatory Visit: Payer: Self-pay

## 2018-05-02 ENCOUNTER — Inpatient Hospital Stay: Payer: Medicare Other | Attending: Oncology | Admitting: Oncology

## 2018-05-02 ENCOUNTER — Inpatient Hospital Stay: Payer: Medicare Other

## 2018-05-02 VITALS — BP 117/71 | HR 71 | Temp 96.2°F | Wt 175.0 lb

## 2018-05-02 DIAGNOSIS — R918 Other nonspecific abnormal finding of lung field: Secondary | ICD-10-CM | POA: Diagnosis not present

## 2018-05-02 DIAGNOSIS — C210 Malignant neoplasm of anus, unspecified: Secondary | ICD-10-CM | POA: Insufficient documentation

## 2018-05-02 DIAGNOSIS — C211 Malignant neoplasm of anal canal: Secondary | ICD-10-CM

## 2018-05-02 LAB — CBC WITH DIFFERENTIAL/PLATELET
Abs Immature Granulocytes: 0 10*3/uL (ref 0.00–0.07)
Basophils Absolute: 0 10*3/uL (ref 0.0–0.1)
Basophils Relative: 1 %
Eosinophils Absolute: 0.2 10*3/uL (ref 0.0–0.5)
Eosinophils Relative: 3 %
HCT: 37.5 % (ref 36.0–46.0)
Hemoglobin: 11.7 g/dL — ABNORMAL LOW (ref 12.0–15.0)
Immature Granulocytes: 0 %
Lymphocytes Relative: 32 %
Lymphs Abs: 1.4 10*3/uL (ref 0.7–4.0)
MCH: 28.5 pg (ref 26.0–34.0)
MCHC: 31.2 g/dL (ref 30.0–36.0)
MCV: 91.5 fL (ref 80.0–100.0)
Monocytes Absolute: 0.2 10*3/uL (ref 0.1–1.0)
Monocytes Relative: 5 %
Neutro Abs: 2.6 10*3/uL (ref 1.7–7.7)
Neutrophils Relative %: 59 %
Platelets: 142 10*3/uL — ABNORMAL LOW (ref 150–400)
RBC: 4.1 MIL/uL (ref 3.87–5.11)
RDW: 15.2 % (ref 11.5–15.5)
WBC: 4.4 10*3/uL (ref 4.0–10.5)
nRBC: 0 % (ref 0.0–0.2)

## 2018-05-02 LAB — COMPREHENSIVE METABOLIC PANEL
ALT: 12 U/L (ref 0–44)
AST: 27 U/L (ref 15–41)
Albumin: 3.9 g/dL (ref 3.5–5.0)
Alkaline Phosphatase: 96 U/L (ref 38–126)
Anion gap: 6 (ref 5–15)
BUN: 12 mg/dL (ref 8–23)
CO2: 26 mmol/L (ref 22–32)
Calcium: 8.6 mg/dL — ABNORMAL LOW (ref 8.9–10.3)
Chloride: 107 mmol/L (ref 98–111)
Creatinine, Ser: 0.78 mg/dL (ref 0.44–1.00)
GFR calc Af Amer: >60 mL/min (ref >60)
GFR calc non Af Amer: >60 mL/min (ref >60)
Glucose, Bld: 159 mg/dL — ABNORMAL HIGH (ref 70–99)
Potassium: 3.2 mmol/L — ABNORMAL LOW (ref 3.5–5.1)
Sodium: 139 mmol/L (ref 135–145)
Total Bilirubin: 0.4 mg/dL (ref 0.3–1.2)
Total Protein: 6.6 g/dL (ref 6.5–8.1)

## 2018-05-04 ENCOUNTER — Encounter: Payer: Self-pay | Admitting: Podiatry

## 2018-05-04 ENCOUNTER — Ambulatory Visit (INDEPENDENT_AMBULATORY_CARE_PROVIDER_SITE_OTHER): Payer: Medicare Other | Admitting: Podiatry

## 2018-05-04 DIAGNOSIS — L989 Disorder of the skin and subcutaneous tissue, unspecified: Secondary | ICD-10-CM | POA: Diagnosis not present

## 2018-05-04 DIAGNOSIS — B351 Tinea unguium: Secondary | ICD-10-CM

## 2018-05-04 DIAGNOSIS — M79676 Pain in unspecified toe(s): Secondary | ICD-10-CM

## 2018-05-04 NOTE — Progress Notes (Signed)
Hematology/Oncology Follow up Note [facility] Telephone:([phone]) [phone] [phone]   Patient Care Team: Lorelee Market, MD as PCP - General (Family Medicine) Clent Jacks, RN as Registered Nurse  REFERRING PROVIDER: Dr.Byrnett CHIEF COMPLAINTS/REASON FOR VISIT:  Follow up for anal cancer  HISTORY OF PRESENTING ILLNESS:  Kristi [name] is a  78 y.o.  female with PMH listed below who was referred to me for evaluation of anal cancer.  Patient had upper endoscopy and colonoscopy done on 11/29/2017. Colonoscopy showed diverticulosis in the sigmoid colon.  15 mm polyp at anus. Patient was referred to surgery Dr. 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  HPV related changes and adjacent squamous epithelium. Margins negative for invasive carcinoma. Lymphovascular invasion not identified. Perineural invasion not identified. Pathological staging pT1 Nx's  Patient presents for discussion of pathology results, cancer diagnosis, and discussion of adjuvant chemotherapy treatments. She reports feeling mildly nauseated, no vomiting.  All She has intermittent loose stools, denies any rectal bleeding or pain.   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 Recent 2D echo April 11, 2017 showed LVEF 65%, mild regurgitation of aortic valve, mitral valve and tricuspid valve.  Normal pulmonary artery pressures. Lower extremities swelling, chronic, she uses pump, follows up with vascular surgeon.   #02/12/2018 CT chest abdomen pelvis showed no adenopathy or other evidence of metastatic disease in the abdomen or pelvis. Mild left supraclavicular lymphadenopathy.  Several small pulmonary nodules both lung.  Largest 6 mm.  Need repeat CT scan for follow-up. #02/20/2018 PET scan showed no hypermetabolic activity on left supraclavicular region:    INTERVAL HISTORY Kristi [name] is a 78 y.o. female who has above history reviewed by me today presents for follow up visit for management of Stage 1 anal cancer.  #Patient reports feeling well today. She has completed definitive radiation treatments on 04/17/2018 for stage I anal cancer.  Chemotherapy was not offered due to patient's multiple comorbidity after discussion with patient's cardiologist.  Patient denies any concerns about anal area.  Denies any anal discharge, odor, perianal pain, fluid collection.   Review of Systems  Constitutional: Positive for fatigue. Negative for appetite change, chills and fever.  HENT:   Negative for hearing loss and voice change.   Eyes: Negative for eye problems.  Respiratory: Negative for chest tightness and cough.   Cardiovascular: Negative for chest pain.  Gastrointestinal: Negative for abdominal distention, abdominal pain and blood in stool.  Endocrine: Negative for hot flashes.  Genitourinary: Negative for difficulty urinating and frequency.   Musculoskeletal: Negative for arthralgias.  Skin: Negative for itching and rash.  Neurological: Negative for extremity weakness and headaches.  Hematological: Negative for adenopathy.  Psychiatric/Behavioral: Negative for confusion.    MEDICAL HISTORY:  Past Medical History:  Diagnosis Date  . Anxiety   . Arthritis   . Broken arm    left FA 3/17  . Broken arm    left  . CAD (coronary artery disease)    s/p MI  . Cancer ([place])    uterine  . Cervical disc disorder   . Depression   . Diabetes mellitus without complication ([place])   . GERD (gastroesophageal reflux disease)   . Headache   . Hyperlipidemia   . Hypertension   . Hypothyroid   . Myocardial infarction ([place])    15 year ago  . Neck pain 06/04/2014  . Parkinson's disease ([place])   .  Presence of permanent cardiac pacemaker   . Spinal stenosis     SURGICAL HISTORY: Past Surgical History:  Procedure Laterality Date  . ABDOMINAL  HYSTERECTOMY     partial  . BREAST SURGERY    . COLONOSCOPY    . COLONOSCOPY WITH PROPOFOL N/A 11/29/2017   Procedure: COLONOSCOPY WITH PROPOFOL;  Surgeon: [name], MD;  Location: [facility] ENDOSCOPY;  Service: Endoscopy;  Laterality: N/A;  . CORONARY ANGIOPLASTY     stent  . CORONARY ARTERY BYPASS GRAFT    . CORONARY STENT PLACEMENT    . ESOPHAGOGASTRODUODENOSCOPY (EGD) WITH PROPOFOL N/A 04/18/2017   Procedure: ESOPHAGOGASTRODUODENOSCOPY (EGD) WITH PROPOFOL;  Surgeon: [name], MD;  Location: [facility] ENDOSCOPY;  Service: Endoscopy;  Laterality: N/A;  . ESOPHAGOGASTRODUODENOSCOPY (EGD) WITH PROPOFOL N/A 11/29/2017   Procedure: ESOPHAGOGASTRODUODENOSCOPY (EGD) WITH PROPOFOL;  Surgeon: [name], MD;  Location: [facility] ENDOSCOPY;  Service: Endoscopy;  Laterality: N/A;  . INSERT / REPLACE / REMOVE PACEMAKER    . s/p pacer insertion    . TRANSANAL EXCISION OF RECTAL MASS N/A 01/17/2018   Procedure: TRANSANAL EXCISION OF RECTAL POLYP;  Surgeon: [name], MD;  Location: [facility] ORS;  Service: General;  Laterality: N/A;    SOCIAL HISTORY: Social History   Socioeconomic History  . Marital status: Widowed    Spouse name: Not on file  . Number of children: Not on file  . Years of education: Not on file  . Highest education level: Not on file  Occupational History  . Occupation: retired  Scientific laboratory technician  . Financial resource strain: Not on file  . Food insecurity:    Worry: Not on file    Inability: Not on file  . Transportation needs:    Medical: Not on file    Non-medical: Not on file  Tobacco Use  . Smoking status: Never Smoker  . Smokeless tobacco: Never Used  Substance and Sexual Activity  . Alcohol use: No  . Drug use: No  . Sexual activity: Never  Lifestyle  . Physical activity:    Days per week: Not on file    Minutes per session: Not on file  . Stress: Not on file  Relationships  . Social connections:    Talks on phone: Not on file    Gets together: Not  on file    Attends religious service: Not on file    Active member of club or organization: Not on file    Attends meetings of clubs or organizations: Not on file    Relationship status: Not on file  . Intimate partner violence:    Fear of current or ex partner: Not on file    Emotionally abused: Not on file    Physically abused: Not on file    Forced sexual activity: Not on file  Other Topics Concern  . Not on file  Social History Narrative  . Not on file    FAMILY HISTORY: Family History  Problem Relation Age of Onset  . Cancer Mother   . Depression Mother   . Diabetes Mother   . Hypertension Mother   . Aneurysm Mother   . Heart disease Father   . Diabetes Sister   . Diabetes Brother   . Diabetes Brother     ALLERGIES:  is allergic to penicillins and penicillin g.  MEDICATIONS:  Current Outpatient Medications  Medication Sig Dispense Refill  . aspirin EC 81 MG tablet Take 81 mg by mouth daily.    . capecitabine (  XELODA) 500 MG tablet Take 3 tablets (1,500 mg total) by mouth 2 (two) times daily after a meal. Take Monday through Friday only on days of radiation. 120 tablet 0  . carbidopa-levodopa (SINEMET IR) 25-100 MG tablet Take 1 tablet by mouth 3 (three) times daily.     . carvedilol (COREG) 3.125 MG tablet Take 1 tablet (3.125 mg total) by mouth 2 (two) times daily with a meal. (Patient taking differently: Take 3.125 mg by mouth daily. ) 60 tablet 2  . docusate sodium (COLACE) 100 MG capsule Take 100 mg by mouth 2 (two) times daily.    . furosemide (LASIX) 40 MG tablet Take 40 mg by mouth daily.     [name] gabapentin (NEURONTIN) 300 MG capsule Take 300-600 mg by mouth See admin instructions. Take 300 mg by mouth and 600 mg at bedtime    . glimepiride (AMARYL) 1 MG tablet Take 1 tablet (1 mg total) by mouth daily with breakfast. 30 tablet 2  . hydrOXYzine (ATARAX/VISTARIL) 25 MG tablet Take 25 mg by mouth daily.    [name] levothyroxine (SYNTHROID, LEVOTHROID) 137 MCG tablet       . Linaclotide (LINZESS) 145 MCG CAPS capsule Take 145 mcg by mouth daily.    [name] loratadine (CLARITIN) 10 MG tablet Take 10 mg by mouth daily.    . meclizine (ANTIVERT) 12.5 MG tablet     . mupirocin ointment (BACTROBAN) 2 %     . nitroGLYCERIN (NITROSTAT) 0.4 MG SL tablet Place 0.4 mg under the tongue every 5 (five) minutes as needed for chest pain.     [name] omeprazole (PRILOSEC) 20 MG capsule Take 20 mg by mouth 2 (two) times daily before a meal.     . ondansetron (ZOFRAN-ODT) 4 MG disintegrating tablet     . potassium chloride SA (K-DUR,KLOR-CON) 20 MEQ tablet Take 20 mEq by mouth 2 (two) times daily.     . rosuvastatin (CRESTOR) 20 MG tablet Take 20 mg by mouth daily.    . sertraline (ZOLOFT) 100 MG tablet Take 50-100 mg by mouth See admin instructions. Take 100 mg in the am and 50 mg at bedtime.    . sucralfate (CARAFATE) 1 g tablet Take 1 g by mouth 3 (three) times daily.    . metoprolol succinate (TOPROL-XL) 25 MG 24 hr tablet Take 25 mg by mouth daily.     No current facility-administered medications for this visit.      PHYSICAL EXAMINATION: ECOG PERFORMANCE STATUS: 1 - Symptomatic but completely ambulatory Vitals:   05/02/18 1400  BP: 117/71  Pulse: 71  Temp: (!) 96.2 F (35.7 C)   Filed Weights   05/02/18 1400  Weight: 175 lb (79.4 kg)    Physical Exam Constitutional:      General: She is not in acute distress. HENT:     Head: Normocephalic and atraumatic.  Eyes:     General: No scleral icterus.    Pupils: Pupils are equal, round, and reactive to light.  Neck:     Musculoskeletal: Normal range of motion and neck supple.  Cardiovascular:     Rate and Rhythm: Normal rate and regular rhythm.     Heart sounds: Normal heart sounds.  Pulmonary:     Effort: Pulmonary effort is normal. No respiratory distress.     Breath sounds: No wheezing.  Abdominal:     General: Bowel sounds are normal. There is no distension.     Palpations: Abdomen is soft. There is no mass.  Tenderness: There is no abdominal tenderness.  Musculoskeletal: Normal range of motion.        General: No deformity.  Skin:    General: Skin is warm and dry.     Findings: No erythema or rash.  Neurological:     Mental Status: She is alert and oriented to person, place, and time.     Cranial Nerves: No cranial nerve deficit.     Coordination: Coordination normal.  Psychiatric:        Behavior: Behavior normal.        Thought Content: Thought content normal.      LABORATORY DATA:  I have reviewed the data as listed Lab Results  Component Value Date   WBC 4.4 05/02/2018   HGB 11.7 (L) 05/02/2018   HCT 37.5 05/02/2018   MCV 91.5 05/02/2018   PLT 142 (L) 05/02/2018   Recent Labs    01/11/18 1151 02/05/18 1614 05/02/18 1306  NA 142 140 139  K 3.9 3.8 3.2*  CL 106 98 107  CO2 28 32 26  GLUCOSE 132* 140* 159*  BUN 19 20 12   CREATININE 0.76 0.98 0.78  CALCIUM 9.3 9.0 8.6*  GFRNONAA >60 53* >60  GFRAA >60 >60 >60  PROT  --  7.1 6.6  ALBUMIN  --  3.9 3.9  AST  --  27 27  ALT  --  12 12  ALKPHOS  --  91 96  BILITOT  --  0.3 0.4   Iron/TIBC/Ferritin/ %Sat No results found for: IRON, TIBC, FERRITIN, IRONPCTSAT   RADIOGRAPHIC STUDIES: I have personally reviewed the radiological images as listed and agreed with the findings in the report. 02/12/2018 CT chest abdomen pelvis with contrast showed no adenopathy or other evidence of metastatic disease in the abdomen or pelvis.  Mild left supraclavicular lymphadenopathy, obscured by streak artifact from left chest pacemaker. Several pulmonary nodules, indeterminate.  Nonspecific asymmetric mild right anal wall thickening and hyperenhancement.  Postsurgical changes. 02/20/2018 No findings identified to suggest a residual/recurrent tumor or hypermetabolic metastasis.  There is no abnormal hypermetabolic is not identified within the left supraclavicular region.  Distal rectum and anus mild radiotracer uptake.  Nonspecific.  Aortic  atherosclerosis.  Multivessel coronary artery atherosclerosis calcification.  Hiatal hernia.  ASSESSMENT & PLAN:  1. Anal cancer ([place])   2. Lung nodule, multiple   clinical pT1,cN0 M0. Status post definitive radiation treatment. Discussed with patient about NCCN guideline recommended surveillance plan. Recommend patient to follow-up with Dr.Byrnett for repeat examination 8 to 12 weeks after finishing radiation. If complete remission, recommend digital rectal examination, inguinal node palpation every 3 to 6 months for 5 years. Anoscopy every 6 to 12 months for 3 years. CT chest abdomen pelvis with contrast annually for 3 years.    Multiple small lung nodules on initial staging CT.  Will obtain CT chest abdomen pelvis in July.  Orders Placed This Encounter  Procedures  . Comprehensive metabolic panel    Standing Status:   Future    Standing Expiration Date:   05/03/2019  . CBC with Differential/Platelet    Standing Status:   Future    Standing Expiration Date:   05/03/2019    All questions were answered. The patient knows to call the clinic with any problems questions or concerns.  Return of visit: July 2020.    Earlie Server, MD, Hematology [facility] Pager- [phone] 05/04/2018

## 2018-05-07 NOTE — Progress Notes (Signed)
    Subjective: Patient is a 78 y.o. female presenting to the office today for follow up evaluation of painful callus lesions noted to the right foot. Walking increases the pain. She has not done anything at home for treatment.  Patient also complains of elongated, thickened nails that cause pain while ambulating in shoes. She is unable to trim her own nails. Patient presents today for further treatment and evaluation.  Past Medical History:  Diagnosis Date  . Anxiety   . Arthritis   . Broken arm    left FA 3/17  . Broken arm    left  . CAD (coronary artery disease)    s/p MI  . Cancer ([place])    uterine  . Cervical disc disorder   . Depression   . Diabetes mellitus without complication ([place])   . GERD (gastroesophageal reflux disease)   . Headache   . Hyperlipidemia   . Hypertension   . Hypothyroid   . Myocardial infarction ([place])    15 year ago  . Neck pain 06/04/2014  . Parkinson's disease ([place])   . Presence of permanent cardiac pacemaker   . Spinal stenosis     Objective:  Physical Exam General: Alert and oriented x3 in no acute distress  Dermatology: Hyperkeratotic lesions present on the right foot x 2. Pain on palpation with a central nucleated core noted. Skin is warm, dry and supple bilateral lower extremities. Negative for open lesions or macerations. Nails are tender, long, thickened and dystrophic with subungual debris, consistent with onychomycosis, 1-5 bilateral. No signs of infection noted.  Vascular: Palpable pedal pulses bilaterally. No edema or erythema noted. Capillary refill within normal limits.  Neurological: Epicritic and protective threshold diminished bilaterally.   Musculoskeletal Exam: Pain on palpation at the keratotic lesion noted. Range of motion within normal limits bilateral. Muscle strength 5/5 in all groups bilateral.  Assessment: 1. Onychodystrophic nails 1-5 bilateral with hyperkeratosis of nails.  2. Onychomycosis of nail due to  dermatophyte bilateral 3. Porokeratosis noted to the right foot x 2   Plan of Care:  1. Patient evaluated. 2. Excisional debridement of keratoic lesion using a chisel blade was performed without incident. Salinocaine applied to areas.  3. Dressed with light dressing. 4. Mechanical debridement of nails 1-5 bilaterally performed using a nail nipper. Filed with dremel without incident.  5. Patient is to return to the clinic as needed.   Edrick Kins, DPM [facility]  Dr. Edrick Kins, [place]                                        [place],  [address]                Office [phone]  Fax [phone]

## 2018-05-08 ENCOUNTER — Ambulatory Visit (INDEPENDENT_AMBULATORY_CARE_PROVIDER_SITE_OTHER): Payer: Medicare Other | Admitting: General Surgery

## 2018-05-08 ENCOUNTER — Other Ambulatory Visit: Payer: Self-pay

## 2018-05-08 ENCOUNTER — Encounter: Payer: Self-pay | Admitting: General Surgery

## 2018-05-08 VITALS — BP 110/72 | HR 86 | Temp 97.7°F | Resp 16 | Ht 66.0 in | Wt 174.0 lb

## 2018-05-08 DIAGNOSIS — C211 Malignant neoplasm of anal canal: Secondary | ICD-10-CM | POA: Diagnosis not present

## 2018-05-08 MED ORDER — POTASSIUM CHLORIDE CRYS ER 20 MEQ PO TBCR: 20.0000 meq | EXTENDED_RELEASE_TABLET | Freq: Every day | ORAL | 0 refills | Status: DC

## 2018-05-08 NOTE — Patient Instructions (Addendum)
The patient is aware to call back for any questions or new concerns. Take potassium  1 1/2 tablet daily

## 2018-05-08 NOTE — Progress Notes (Signed)
Patient ID: Kristi [name], female   DOB: 02/04/1941, 78 y.o.   MRN: [id number]  Chief Complaint  Patient presents with  . Follow-up    f/u anal ca     HPI Kristi [name] is a 78 y.o. female.  Here for her 2 month follow up anal cancer. She completed radiation last week. She states occasional diarrhea (2-3 times a month) may causes some rectal "soreness". Denies bleeding.  HPI  Past Medical History:  Diagnosis Date  . Anxiety   . Arthritis   . Broken arm    left FA 3/17  . Broken arm    left  . CAD (coronary artery disease)    s/p MI  . Cancer ([place])    uterine  . Cervical disc disorder   . Depression   . Diabetes mellitus without complication ([place])   . GERD (gastroesophageal reflux disease)   . Headache   . Hyperlipidemia   . Hypertension   . Hypothyroid   . Myocardial infarction ([place])    15 year ago  . Neck pain 06/04/2014  . Parkinson's disease ([place])   . Presence of permanent cardiac pacemaker   . Spinal stenosis    ' Past Surgical History:  Procedure Laterality Date  . ABDOMINAL HYSTERECTOMY     partial  . BREAST SURGERY    . COLONOSCOPY    . COLONOSCOPY WITH PROPOFOL N/A 11/29/2017   Procedure: COLONOSCOPY WITH PROPOFOL;  Surgeon: [name], MD;  Location: [facility] ENDOSCOPY;  Service: Endoscopy;  Laterality: N/A;  . CORONARY ANGIOPLASTY     stent  . CORONARY ARTERY BYPASS GRAFT    . CORONARY STENT PLACEMENT    . ESOPHAGOGASTRODUODENOSCOPY (EGD) WITH PROPOFOL N/A 04/18/2017   Procedure: ESOPHAGOGASTRODUODENOSCOPY (EGD) WITH PROPOFOL;  Surgeon: [name], MD;  Location: [facility] ENDOSCOPY;  Service: Endoscopy;  Laterality: N/A;  . ESOPHAGOGASTRODUODENOSCOPY (EGD) WITH PROPOFOL N/A 11/29/2017   Procedure: ESOPHAGOGASTRODUODENOSCOPY (EGD) WITH PROPOFOL;  Surgeon: [name], MD;  Location: [facility] ENDOSCOPY;  Service: Endoscopy;  Laterality: N/A;  . INSERT / REPLACE / REMOVE PACEMAKER    . s/p pacer insertion    . TRANSANAL EXCISION OF RECTAL MASS N/A  01/17/2018   Procedure: TRANSANAL EXCISION OF RECTAL POLYP;  Surgeon: [name], MD;  Location: [facility] ORS;  Service: General;  Laterality: N/A;    Family History  Problem Relation Age of Onset  . Cancer Mother   . Depression Mother   . Diabetes Mother   . Hypertension Mother   . Aneurysm Mother   . Heart disease Father   . Diabetes Sister   . Diabetes Brother   . Diabetes Brother     Social History Social History   Tobacco Use  . Smoking status: Never Smoker  . Smokeless tobacco: Never Used  Substance Use Topics  . Alcohol use: No  . Drug use: No    Allergies  Allergen Reactions  . Penicillins Anaphylaxis  . Penicillin G Swelling and Rash    Has patient had a PCN reaction causing immediate rash, facial/tongue/throat swelling, SOB or lightheadedness with hypotension: Yes Has patient had a PCN reaction causing severe rash involving mucus membranes or skin necrosis: No Has patient had a PCN reaction that required hospitalization: No Has patient had a PCN reaction occurring within the last 10 years: No If all of the above answers are "NO", then may proceed with Cephalosporin use.     Current Outpatient Medications  Medication Sig Dispense Refill  . aspirin  EC 81 MG tablet Take 81 mg by mouth daily.    . capecitabine (XELODA) 500 MG tablet Take 3 tablets (1,500 mg total) by mouth 2 (two) times daily after a meal. Take Monday through Friday only on days of radiation. 120 tablet 0  . carbidopa-levodopa (SINEMET IR) 25-100 MG tablet Take 1 tablet by mouth 3 (three) times daily.     . carvedilol (COREG) 3.125 MG tablet Take 1 tablet (3.125 mg total) by mouth 2 (two) times daily with a meal. (Patient taking differently: Take 3.125 mg by mouth daily. ) 60 tablet 2  . docusate sodium (COLACE) 100 MG capsule Take 100 mg by mouth 2 (two) times daily.    . furosemide (LASIX) 40 MG tablet Take 40 mg by mouth daily.     [name] gabapentin (NEURONTIN) 300 MG capsule Take 300-600 mg by  mouth See admin instructions. Take 300 mg by mouth and 600 mg at bedtime    . glimepiride (AMARYL) 1 MG tablet Take 1 tablet (1 mg total) by mouth daily with breakfast. 30 tablet 2  . hydrOXYzine (ATARAX/VISTARIL) 25 MG tablet Take 25 mg by mouth daily.    [name] levothyroxine (SYNTHROID, LEVOTHROID) 137 MCG tablet     . Linaclotide (LINZESS) 145 MCG CAPS capsule Take 145 mcg by mouth daily.    [name] loratadine (CLARITIN) 10 MG tablet Take 10 mg by mouth daily.    . meclizine (ANTIVERT) 12.5 MG tablet     . metoprolol succinate (TOPROL-XL) 25 MG 24 hr tablet Take 25 mg by mouth daily.    . mupirocin ointment (BACTROBAN) 2 %     . nitroGLYCERIN (NITROSTAT) 0.4 MG SL tablet Place 0.4 mg under the tongue every 5 (five) minutes as needed for chest pain.     [name] omeprazole (PRILOSEC) 20 MG capsule Take 20 mg by mouth 2 (two) times daily before a meal.     . ondansetron (ZOFRAN-ODT) 4 MG disintegrating tablet     . potassium chloride SA (K-DUR,KLOR-CON) 20 MEQ tablet Take 20 mEq by mouth 2 (two) times daily. Pt takes once  day    . rosuvastatin (CRESTOR) 20 MG tablet Take 20 mg by mouth daily.    . sertraline (ZOLOFT) 100 MG tablet Take 50-100 mg by mouth See admin instructions. Take 100 mg in the am and 50 mg at bedtime.    . sucralfate (CARAFATE) 1 g tablet Take 1 g by mouth 3 (three) times daily.    . potassium chloride SA (K-DUR,KLOR-CON) 20 MEQ tablet Take 1 tablet (20 mEq total) by mouth daily. Take 1 1/2 tablet daily 30 tablet 0   No current facility-administered medications for this visit.     Review of Systems Review of Systems  Constitutional: Positive for fatigue.  Respiratory: Negative.   Cardiovascular: Negative.     Blood pressure 110/72, pulse 86, temperature 97.7 F (36.5 C), temperature source Temporal, resp. rate 16, height 5\' 6"  (1.676 m), weight 174 lb (78.9 kg), SpO2 95 %.  Physical Exam Physical Exam Constitutional:      Appearance: Normal appearance.  Cardiovascular:      Pulses:          Femoral pulses are 2+ on the right side and 2+ on the left side. Genitourinary:   Lymphadenopathy:     Lower Body: No right inguinal adenopathy. No left inguinal adenopathy.  Skin:    General: Skin is warm and dry.  Neurological:     Mental Status: She is alert  and oriented to person, place, and time.  Psychiatric:        Mood and Affect: Mood normal.     Data Reviewed January 17, 2018 pathology:   A. 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  - SEE CANCER SUMMARY BELOW.  - HPV-RELATED CHANGES IN ADJACENT SQUAMOUS EPITHELIUM.   CANCER CASE SUMMARY: ANUS  Specimen: Anal canal  Procedure: Local excision (transanal disc excision)  Specimen integrity: Intact  Tumor Site: Anal canal  Tumor Size: Greatest dimension: 1.9 cm  Histologic type: Squamous cell carcinoma  Histologic grade: G3, poorly differentiated  Tumor Extension: Tumor invades anal sphincter muscle  Margins:    Deep margin: Uninvolved by invasive carcinoma       Distance of invasivecarcinoma from closest margin: 1.6 mm    Mucosal margin: Uninvolved by invasive carcinoma       Distance of invasive carcinoma from closest margin: 0.7 mm   Assessment    Doing well post excision followed by postoperative radiation therapy.  Minimal discomfort reported with bowel movement.  Mild hypo-kalemia noted on recent labs with Dr. Tasia Catchings on May 02, 2018.    Plan    Follow up in 4 months  The patient had been making use of K-Lor, 20 mEq tablets daily.  She was instructed to increase this to 1.5 tablets daily.  A prescription for 1 month worth of medication was provided.  She will be following up with her PCP in the interval.  Potassium 1 1/2 tablet daily    HPI, assessment, plan and physical exam has been scribed under the direction and in the presence of [name], MD. Karie Fetch, RN  I have completed the exam and reviewed  the above documentation for accuracy and completeness.  I agree with the above.  Haematologist has been used and any errors in dictation or transcription are unintentional.  Hervey Ard, M.D., F.A.C.S.   Forest Gleason Julyssa Kyer 05/09/2018, 12:27 PM

## 2018-05-17 ENCOUNTER — Other Ambulatory Visit: Payer: Self-pay

## 2018-05-17 ENCOUNTER — Encounter: Payer: Self-pay | Admitting: Radiation Oncology

## 2018-05-17 ENCOUNTER — Ambulatory Visit
Admission: RE | Admit: 2018-05-17 | Discharge: 2018-05-17 | Disposition: A | Discharge status: Home / Self Care | Payer: Medicare Other | Source: Ambulatory Visit | Attending: Radiation Oncology | Admitting: Radiation Oncology

## 2018-05-17 VITALS — BP 124/81 | HR 90 | Temp 96.7°F | Resp 16 | Wt 172.2 lb

## 2018-05-17 DIAGNOSIS — C211 Malignant neoplasm of anal canal: Secondary | ICD-10-CM

## 2018-05-17 DIAGNOSIS — Z923 Personal history of irradiation: Secondary | ICD-10-CM | POA: Insufficient documentation

## 2018-05-17 NOTE — Progress Notes (Signed)
Radiation Oncology Follow up Note  Name: Kristi [name]   Date:   05/17/2018 MRN:  [id number] DOB: [birth year]    This 78 y.o. 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 Seen today in routine follow-up she is doing well. She specifically denies diarrhea dysuria or any other GI/GU complaints. She says she's tends to have some irritation back there on wiping..  COMPLICATIONS OF TREATMENT: none  FOLLOW UP COMPLIANCE: keeps appointments   PHYSICAL EXAM:  BP 124/81 (BP Location: Left Arm, Patient Position: Sitting)   Pulse 90   Temp (!) 96.7 F (35.9 C) (Tympanic)   Resp 16   Wt 172 lb 2.9 oz (78.1 kg)   BMI 27.79 kg/m  On rectal exam rectal sphincter tone is good no evidence of mass or nodularity in the anus is noted. No inguinal adenopathy is appreciated.Well-developed well-nourished patient in NAD. HEENT reveals PERLA, EOMI, discs not visualized.  Oral cavity is clear. No oral mucosal lesions are identified. Neck is clear without evidence of cervical or supraclavicular adenopathy. Lungs are clear to A&P. Cardiac examination is essentially unremarkable with regular rate and rhythm without murmur rub or thrill. Abdomen is benign with no organomegaly or masses noted. Motor sensory and DTR levels are equal and symmetric in the upper and lower extremities. Cranial nerves II through XII are grossly intact. Proprioception is intact. No peripheral adenopathy or edema is identified. No motor or sensory levels are noted. Crude visual fields are within normal range.  RADIOLOGY RESULTS: no current films for review  PLAN: at the present time patient  is doing well 1 month out from radiation therapy and please were overall progress. I've asked to see her back in 4-5 months for follow-up.Patient is to call with any concerns at any time.  I would like to take this opportunity to thank you for allowing me to participate in the care of your patient.Noreene Filbert, MD

## 2018-09-04 ENCOUNTER — Ambulatory Visit (INDEPENDENT_AMBULATORY_CARE_PROVIDER_SITE_OTHER): Payer: Medicare Other | Admitting: General Surgery

## 2018-09-04 ENCOUNTER — Encounter: Payer: Self-pay | Admitting: General Surgery

## 2018-09-04 ENCOUNTER — Other Ambulatory Visit: Payer: Self-pay

## 2018-09-04 VITALS — BP 121/78 | HR 90 | Temp 97.2°F | Ht 66.0 in | Wt 181.0 lb

## 2018-09-04 DIAGNOSIS — C211 Malignant neoplasm of anal canal: Secondary | ICD-10-CM

## 2018-09-04 NOTE — Progress Notes (Signed)
Patient ID: Kristi [name], female   DOB: 02/07/1941, 78 y.o.   MRN: [id number]  Chief Complaint  Patient presents with  . Follow-up    4 month f/u Anal Cancer    HPI Kristi [name] is a 78 y.o. female.  Here for her follow up anal cancer. She states her bowels move daily, occasionally she'll have diarrhea with occasional constipation. No rectal bleeding. She does notice rectal stinging when she has diarrhea.  Diarrhea is most likely to occur after she makes use of Linzess for constipation. HPI  Past Medical History:  Diagnosis Date  . Anxiety   . Arthritis   . Broken arm    left FA 3/17  . Broken arm    left  . CAD (coronary artery disease)    s/p MI  . Cancer ([place])    uterine  . Cervical disc disorder   . Depression   . Diabetes mellitus without complication ([place])   . GERD (gastroesophageal reflux disease)   . Headache   . Hyperlipidemia   . Hypertension   . Hypothyroid   . Myocardial infarction ([place])    15 year ago  . Neck pain 06/04/2014  . Parkinson's disease ([place])   . Presence of permanent cardiac pacemaker   . Spinal stenosis     Past Surgical History:  Procedure Laterality Date  . ABDOMINAL HYSTERECTOMY     partial  . BREAST SURGERY    . COLONOSCOPY    . COLONOSCOPY WITH PROPOFOL N/A 11/29/2017   Procedure: COLONOSCOPY WITH PROPOFOL;  Surgeon: [name], MD;  Location: [facility] ENDOSCOPY;  Service: Endoscopy;  Laterality: N/A;  . CORONARY ANGIOPLASTY     stent  . CORONARY ARTERY BYPASS GRAFT    . CORONARY STENT PLACEMENT    . ESOPHAGOGASTRODUODENOSCOPY (EGD) WITH PROPOFOL N/A 04/18/2017   Procedure: ESOPHAGOGASTRODUODENOSCOPY (EGD) WITH PROPOFOL;  Surgeon: [name], MD;  Location: [facility] ENDOSCOPY;  Service: Endoscopy;  Laterality: N/A;  . ESOPHAGOGASTRODUODENOSCOPY (EGD) WITH PROPOFOL N/A 11/29/2017   Procedure: ESOPHAGOGASTRODUODENOSCOPY (EGD) WITH PROPOFOL;  Surgeon: [name], MD;  Location: [facility] ENDOSCOPY;  Service: Endoscopy;  Laterality:  N/A;  . INSERT / REPLACE / REMOVE PACEMAKER    . s/p pacer insertion    . TRANSANAL EXCISION OF RECTAL MASS N/A 01/17/2018   Procedure: TRANSANAL EXCISION OF RECTAL POLYP;  Surgeon: [name], MD;  Location: [facility] ORS;  Service: General;  Laterality: N/A;    Family History  Problem Relation Age of Onset  . Cancer Mother   . Depression Mother   . Diabetes Mother   . Hypertension Mother   . Aneurysm Mother   . Heart disease Father   . Diabetes Sister   . Diabetes Brother   . Diabetes Brother     Social History Social History   Tobacco Use  . Smoking status: Never Smoker  . Smokeless tobacco: Never Used  Substance Use Topics  . Alcohol use: No  . Drug use: No    Allergies  Allergen Reactions  . Penicillins Anaphylaxis  . Penicillin G Swelling and Rash    Has patient had a PCN reaction causing immediate rash, facial/tongue/throat swelling, SOB or lightheadedness with hypotension: Yes Has patient had a PCN reaction causing severe rash involving mucus membranes or skin necrosis: No Has patient had a PCN reaction that required hospitalization: No Has patient had a PCN reaction occurring within the last 10 years: No If all of the above answers are "NO", then may proceed  with Cephalosporin use.     Current Outpatient Medications  Medication Sig Dispense Refill  . aspirin EC 81 MG tablet Take 81 mg by mouth daily.    . carbidopa-levodopa (SINEMET IR) 25-100 MG tablet Take 1 tablet by mouth 3 (three) times daily.     . carvedilol (COREG) 3.125 MG tablet Take 1 tablet (3.125 mg total) by mouth 2 (two) times daily with a meal. (Patient taking differently: Take 3.125 mg by mouth daily. ) 60 tablet 2  . docusate sodium (COLACE) 100 MG capsule Take 100 mg by mouth 2 (two) times daily.    . furosemide (LASIX) 40 MG tablet Take 40 mg by mouth daily.     [name] gabapentin (NEURONTIN) 300 MG capsule Take 300-600 mg by mouth See admin instructions. Take 300 mg by mouth and 600 mg at  bedtime    . glimepiride (AMARYL) 1 MG tablet Take 1 tablet (1 mg total) by mouth daily with breakfast. 30 tablet 2  . hydrOXYzine (ATARAX/VISTARIL) 25 MG tablet Take 25 mg by mouth daily.    [name] levothyroxine (SYNTHROID, LEVOTHROID) 137 MCG tablet     . Linaclotide (LINZESS) 145 MCG CAPS capsule Take 145 mcg by mouth daily.    [name] loratadine (CLARITIN) 10 MG tablet Take 10 mg by mouth daily.    . meclizine (ANTIVERT) 12.5 MG tablet     . metoprolol succinate (TOPROL-XL) 25 MG 24 hr tablet Take 25 mg by mouth daily.    . mupirocin ointment (BACTROBAN) 2 %     . nitroGLYCERIN (NITROSTAT) 0.4 MG SL tablet Place 0.4 mg under the tongue every 5 (five) minutes as needed for chest pain.     [name] omeprazole (PRILOSEC) 20 MG capsule Take 20 mg by mouth 2 (two) times daily before a meal.     . ondansetron (ZOFRAN-ODT) 4 MG disintegrating tablet     . potassium chloride SA (K-DUR,KLOR-CON) 20 MEQ tablet Take 20 mEq by mouth 2 (two) times daily. Pt takes once  day    . potassium chloride SA (K-DUR,KLOR-CON) 20 MEQ tablet Take 1 tablet (20 mEq total) by mouth daily. Take 1 1/2 tablet daily 30 tablet 0  . rosuvastatin (CRESTOR) 20 MG tablet Take 20 mg by mouth daily.    . sertraline (ZOLOFT) 100 MG tablet Take 50-100 mg by mouth See admin instructions. Take 100 mg in the am and 50 mg at bedtime.    . sucralfate (CARAFATE) 1 g tablet Take 1 g by mouth 3 (three) times daily.     No current facility-administered medications for this visit.     Review of Systems Review of Systems  Constitutional: Negative.   Respiratory: Negative.   Cardiovascular: Negative.   Gastrointestinal: Negative for anal bleeding and rectal pain.    Blood pressure 121/78, pulse 90, temperature (!) 97.2 F (36.2 C), temperature source Temporal, height 5\' 6"  (1.676 m), weight 181 lb (82.1 kg), SpO2 92 %.  Physical Exam Physical Exam Exam conducted with a chaperone present.  HENT:     Mouth/Throat:     Pharynx: No oropharyngeal  exudate.  Eyes:     General: No scleral icterus. Neck:     Musculoskeletal: Neck supple.  Cardiovascular:     Rate and Rhythm: Normal rate and regular rhythm.     Heart sounds: Normal heart sounds.  Pulmonary:     Effort: Pulmonary effort is normal.     Breath sounds: Normal breath sounds.  Abdominal:     Palpations:  Abdomen is soft.  Genitourinary:    Rectum: Normal.    Lymphadenopathy:     Lower Body: No right inguinal adenopathy. No left inguinal adenopathy.  Skin:    General: Skin is warm and dry.  Neurological:     Mental Status: She is alert and oriented to person, place, and time.  Psychiatric:        Behavior: Behavior normal.     Data Reviewed February 2020 posttreatment radiation oncology notes reviewed.  Assessment No evidence of recurrent disease.  Plan We will plan for follow-up examination in 4 months, 1 year anniversary of her original surgery with anoscopy at that time.   Recommend fiber supplement of choice daily.  Follow up in October   HPI, assessment, plan and physical exam has been scribed under the direction and in the presence of [name], MD. Karie Fetch, RN  I have completed the exam and reviewed the above documentation for accuracy and completeness.  I agree with the above.  Haematologist has been used and any errors in dictation or transcription are unintentional.  Hervey Ard, M.D., F.A.C.S. [place] [facility] Jontez Redfield 09/05/2018, 2:24 PM

## 2018-09-04 NOTE — Patient Instructions (Signed)
Recommend fiber supplement of choice daily.

## 2018-10-05 ENCOUNTER — Ambulatory Visit: Payer: Medicare Other

## 2018-10-05 ENCOUNTER — Encounter: Payer: Self-pay | Admitting: Oncology

## 2018-10-05 ENCOUNTER — Other Ambulatory Visit: Payer: Self-pay

## 2018-10-05 ENCOUNTER — Inpatient Hospital Stay: Payer: Medicare Other | Attending: Oncology

## 2018-10-05 ENCOUNTER — Ambulatory Visit
Admission: RE | Admit: 2018-10-05 | Discharge: 2018-10-05 | Disposition: A | Discharge status: Home / Self Care | Payer: Medicare Other | Source: Ambulatory Visit | Attending: Oncology | Admitting: Oncology

## 2018-10-05 ENCOUNTER — Ambulatory Visit
Admission: RE | Admit: 2018-10-05 | Discharge: 2018-10-05 | Disposition: A | Discharge status: Home / Self Care | Payer: Medicare Other | Source: Ambulatory Visit | Attending: Radiation Oncology | Admitting: Radiation Oncology

## 2018-10-05 ENCOUNTER — Encounter: Payer: Self-pay | Admitting: Radiation Oncology

## 2018-10-05 ENCOUNTER — Inpatient Hospital Stay (HOSPITAL_BASED_OUTPATIENT_CLINIC_OR_DEPARTMENT_OTHER): Payer: Medicare Other | Admitting: Oncology

## 2018-10-05 VITALS — BP 115/82 | HR 79 | Temp 96.6°F | Resp 18 | Wt 174.9 lb

## 2018-10-05 VITALS — BP 123/76 | HR 84 | Temp 96.6°F | Resp 16 | Wt 174.8 lb

## 2018-10-05 DIAGNOSIS — R918 Other nonspecific abnormal finding of lung field: Secondary | ICD-10-CM | POA: Insufficient documentation

## 2018-10-05 DIAGNOSIS — R197 Diarrhea, unspecified: Secondary | ICD-10-CM | POA: Insufficient documentation

## 2018-10-05 DIAGNOSIS — Z923 Personal history of irradiation: Secondary | ICD-10-CM | POA: Insufficient documentation

## 2018-10-05 DIAGNOSIS — C210 Malignant neoplasm of anus, unspecified: Secondary | ICD-10-CM | POA: Diagnosis not present

## 2018-10-05 DIAGNOSIS — M7989 Other specified soft tissue disorders: Secondary | ICD-10-CM | POA: Insufficient documentation

## 2018-10-05 DIAGNOSIS — C211 Malignant neoplasm of anal canal: Secondary | ICD-10-CM

## 2018-10-05 DIAGNOSIS — W57XXXA Bitten or stung by nonvenomous insect and other nonvenomous arthropods, initial encounter: Secondary | ICD-10-CM

## 2018-10-05 LAB — CBC WITH DIFFERENTIAL/PLATELET
Abs Immature Granulocytes: 0.01 10*3/uL (ref 0.00–0.07)
Basophils Absolute: 0 10*3/uL (ref 0.0–0.1)
Basophils Relative: 1 %
Eosinophils Absolute: 0.2 10*3/uL (ref 0.0–0.5)
Eosinophils Relative: 3 %
HCT: 38.8 % (ref 36.0–46.0)
Hemoglobin: 12.4 g/dL (ref 12.0–15.0)
Immature Granulocytes: 0 %
Lymphocytes Relative: 37 %
Lymphs Abs: 2.1 10*3/uL (ref 0.7–4.0)
MCH: 29.5 pg (ref 26.0–34.0)
MCHC: 32 g/dL (ref 30.0–36.0)
MCV: 92.4 fL (ref 80.0–100.0)
Monocytes Absolute: 0.5 10*3/uL (ref 0.1–1.0)
Monocytes Relative: 8 %
Neutro Abs: 3 10*3/uL (ref 1.7–7.7)
Neutrophils Relative %: 51 %
Platelets: 145 10*3/uL — ABNORMAL LOW (ref 150–400)
RBC: 4.2 MIL/uL (ref 3.87–5.11)
RDW: 14.4 % (ref 11.5–15.5)
WBC: 5.8 10*3/uL (ref 4.0–10.5)
nRBC: 0 % (ref 0.0–0.2)

## 2018-10-05 LAB — COMPREHENSIVE METABOLIC PANEL
ALT: 12 U/L (ref 0–44)
AST: 28 U/L (ref 15–41)
Albumin: 4.1 g/dL (ref 3.5–5.0)
Alkaline Phosphatase: 108 U/L (ref 38–126)
Anion gap: 11 (ref 5–15)
BUN: 17 mg/dL (ref 8–23)
CO2: 27 mmol/L (ref 22–32)
Calcium: 9.2 mg/dL (ref 8.9–10.3)
Chloride: 103 mmol/L (ref 98–111)
Creatinine, Ser: 0.84 mg/dL (ref 0.44–1.00)
GFR calc Af Amer: >60 mL/min (ref >60)
GFR calc non Af Amer: >60 mL/min (ref >60)
Glucose, Bld: 167 mg/dL — ABNORMAL HIGH (ref 70–99)
Potassium: 3.5 mmol/L (ref 3.5–5.1)
Sodium: 141 mmol/L (ref 135–145)
Total Bilirubin: 0.4 mg/dL (ref 0.3–1.2)
Total Protein: 7.6 g/dL (ref 6.5–8.1)

## 2018-10-05 MED ORDER — SULFAMETHOXAZOLE-TRIMETHOPRIM 800-160 MG PO TABS: 1.0000 | ORAL_TABLET | Freq: Two times a day (BID) | ORAL | 0 refills | Status: DC

## 2018-10-05 NOTE — Progress Notes (Signed)
Radiation Oncology Follow up Note  Name: Kristi [name]   Date:   10/05/2018 MRN:  [id number] DOB: [birth year]    This 78 y.o. 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  Seen today in routine follow-up she is doing well.  She does have [id number] diarrhea does not follow a low residue diet does not take Imodium she has been taking Pepto-Bismol.  She specifically denies any increased lower urinary tract symptoms..  She had a PET CT scan back in December showing no findings to suggest residual or recurrent tumor or metastatic disease.  COMPLICATIONS OF TREATMENT: none  FOLLOW UP COMPLIANCE: keeps appointments   PHYSICAL EXAM:  BP 123/76 (BP Location: Left Arm, Patient Position: Sitting)   Pulse 84   Temp (!) 96.6 F (35.9 C) (Tympanic)   Resp 16   Wt 174 lb 13.2 oz (79.3 kg)   BMI 28.22 kg/m  On rectal exam rectal sphincter tone is good.  No evidence of mass or nodularity in the anal canal is noted no inguinal adenopathy is appreciated.  Well-developed well-nourished patient in NAD. HEENT reveals PERLA, EOMI, discs not visualized.  Oral cavity is clear. No oral mucosal lesions are identified. Neck is clear without evidence of cervical or supraclavicular adenopathy. Lungs are clear to A&P. Cardiac examination is essentially unremarkable with regular rate and rhythm without murmur rub or thrill. Abdomen is benign with no organomegaly or masses noted. Motor sensory and DTR levels are equal and symmetric in the upper and lower extremities. Cranial nerves II through XII are grossly intact. Proprioception is intact. No peripheral adenopathy or edema is  identified. No motor or sensory levels are noted. Crude visual fields are within normal range.  RADIOLOGY RESULTS: PET CT scan is reviewed compatible with above-stated findings  PLAN: Present time patient is doing well I am starting on a low residue diet and those instructions have been given have also suggested Imodium right ear 1-2 times per day for her diarrhea.  I have asked to see her back in 6 months for follow-up.  Of asked her to make an appointment with Dr. Tollie Pizza for follow-up.  I would anticipate seeing a CT scan in the next several months and have asked to review that when it is available.  I would like to take this opportunity to thank you for allowing me to participate in the care of your patient.Noreene Filbert, MD

## 2018-10-05 NOTE — Progress Notes (Signed)
Pt in for follow up reports having "a few loose stools a week".

## 2018-10-05 NOTE — Progress Notes (Signed)
Hematology/Oncology Follow up Note [facility] [phone] [phone]   Patient Care Team: Lorelee Market, MD as PCP - General (Family Medicine) Clent Jacks, RN as Registered Nurse  REFERRING PROVIDER: Dr.Byrnett CHIEF COMPLAINTS/REASON FOR VISIT:  Follow up for anal cancer  HISTORY OF PRESENTING ILLNESS:  Kristi [name] is a  78 y.o.  female with PMH listed below who was referred to me for evaluation of anal cancer.  Patient had upper endoscopy and colonoscopy done on 11/29/2017. Colonoscopy showed diverticulosis in the sigmoid colon.  15 mm polyp at anus. Patient was referred to surgery Dr. 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  HPV related changes and adjacent squamous epithelium. Margins negative for invasive carcinoma. Lymphovascular invasion not identified. Perineural invasion not identified. Pathological staging pT1 Nx's  Patient presents for discussion of pathology results, cancer diagnosis, and discussion of adjuvant chemotherapy treatments. She reports feeling mildly nauseated, no vomiting.  All She has intermittent loose stools, denies any rectal bleeding or pain.   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 Recent 2D echo April 11, 2017 showed LVEF 65%, mild regurgitation of aortic valve, mitral valve and tricuspid valve.  Normal pulmonary artery pressures. Lower extremities swelling, chronic, she uses pump, follows up with vascular surgeon.   #02/12/2018 CT chest abdomen pelvis showed no adenopathy or other evidence of metastatic disease in the abdomen or pelvis. Mild left supraclavicular lymphadenopathy.  Several small pulmonary nodules both lung.  Largest 6 mm.  Need repeat CT scan for follow-up. #02/20/2018 PET scan showed no hypermetabolic activity on left supraclavicular region:   #  completed definitive radiation treatments on 04/17/2018 for stage I anal cancer.  Chemotherapy was not offered due to patient's multiple comorbidity after discussion with patient's cardiologist.   INTERVAL HISTORY Kristi [name] is a 78 y.o. female who has above history reviewed by me today presents for follow up visit for management of Stage 1 anal cancer. #Patient reports feeling well today. Patient has intermittent diarrhea, 1 to 2 days out of 1 week.  2-3 loose bowel movements on the diarrhea days She takes over-the-counter antidiarrhea medication and reports symptom relief. Denies any concerns about anal area.  Denies any anal discharge, odor, perianal pain or fluid collection. Patient was seen by Dr. Bary Castilla on 09/04/2018.  Patient had digital anal exam, negative exam. Patient denies any concerns about anal area.  Denies any anal discharge, odor, perianal pain, fluid collection.   Review of Systems  Constitutional: Positive for fatigue. Negative for appetite change, chills and fever.  HENT:   Negative for hearing loss and voice change.   Eyes: Negative for eye problems.  Respiratory: Negative for chest tightness and cough.   Cardiovascular: Negative for chest pain.  Gastrointestinal: Negative for abdominal distention, abdominal pain and blood in stool.  Endocrine: Negative for hot flashes.  Genitourinary: Negative for difficulty urinating and frequency.   Musculoskeletal: Negative for arthralgias.       Right lower extremity swelling after bug bite  Skin: Negative for itching and rash.  Neurological: Negative for extremity weakness and headaches.  Hematological: Negative for adenopathy.  Psychiatric/Behavioral: Negative for confusion.    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   Cervical disc disorder     Depression    Diabetes mellitus without complication (HCC)    GERD (gastroesophageal reflux disease)    Headache    Hyperlipidemia    Hypertension    Hypothyroid    Myocardial infarction ([place])    15 year ago   Neck pain 06/04/2014   Parkinson's disease ([place])    Presence of permanent cardiac pacemaker    Spinal stenosis     SURGICAL HISTORY: Past Surgical History:  Procedure Laterality Date   ABDOMINAL HYSTERECTOMY     partial   BREAST SURGERY     COLONOSCOPY     COLONOSCOPY WITH PROPOFOL N/A 11/29/2017   Procedure: COLONOSCOPY WITH PROPOFOL;  Surgeon: [name], MD;  Location: [facility] ENDOSCOPY;  Service: Endoscopy;  Laterality: N/A;   CORONARY ANGIOPLASTY     stent   CORONARY ARTERY BYPASS GRAFT     CORONARY STENT PLACEMENT     ESOPHAGOGASTRODUODENOSCOPY (EGD) WITH PROPOFOL N/A 04/18/2017   Procedure: ESOPHAGOGASTRODUODENOSCOPY (EGD) WITH PROPOFOL;  Surgeon: [name], MD;  Location: [facility] ENDOSCOPY;  Service: Endoscopy;  Laterality: N/A;   ESOPHAGOGASTRODUODENOSCOPY (EGD) WITH PROPOFOL N/A 11/29/2017   Procedure: ESOPHAGOGASTRODUODENOSCOPY (EGD) WITH PROPOFOL;  Surgeon: [name], MD;  Location: [facility] ENDOSCOPY;  Service: Endoscopy;  Laterality: N/A;   INSERT / REPLACE / REMOVE PACEMAKER     s/p pacer insertion     TRANSANAL EXCISION OF RECTAL MASS N/A 01/17/2018   Procedure: TRANSANAL EXCISION OF RECTAL POLYP;  Surgeon: [name], MD;  Location: [facility] ORS;  Service: General;  Laterality: N/A;    SOCIAL HISTORY: Social History   Socioeconomic History   Marital status: Widowed    Spouse name: Not on file   Number of children: Not on file   Years of education: Not on file   Highest education level: Not on file  Occupational History   Occupation: retired  Scientist, product/process development strain: Not on file   Food insecurity    Worry: Not on file    Inability: Not on Lexicographer needs    Medical: Not on  file    Non-medical: Not on file  Tobacco Use   Smoking status: Never Smoker   Smokeless tobacco: Never Used  Substance and Sexual Activity   Alcohol use: No   Drug use: No   Sexual activity: Never  Lifestyle   Physical activity    Days per week: Not on file    Minutes per session: Not on file   Stress: Not on file  Relationships   Social connections    Talks on phone: Not on file    Gets together: Not on file    Attends religious service: Not on file    Active member of club or organization: Not on file    Attends meetings of clubs or organizations: Not on file    Relationship status: Not on file   Intimate partner violence    Fear of current or ex partner: Not on file    Emotionally abused: Not on file    Physically abused: Not on file    Forced sexual activity: Not on file  Other Topics Concern   Not on file  Social History Narrative   Not on file    FAMILY HISTORY: Family History  Problem Relation Age of Onset   Cancer Mother    Depression Mother  Diabetes Mother    Hypertension Mother    Aneurysm Mother    Heart disease Father    Diabetes Sister    Diabetes Brother    Diabetes Brother     ALLERGIES:  is allergic to penicillins and penicillin g.  MEDICATIONS:  Current Outpatient Medications  Medication Sig Dispense Refill   aspirin EC 81 MG tablet Take 81 mg by mouth daily.     carbidopa-levodopa (SINEMET IR) 25-100 MG tablet Take 1 tablet by mouth 3 (three) times daily.      carvedilol (COREG) 3.125 MG tablet Take 1 tablet (3.125 mg total) by mouth 2 (two) times daily with a meal. (Patient taking differently: Take 3.125 mg by mouth daily. ) 60 tablet 2   docusate sodium (COLACE) 100 MG capsule Take 100 mg by mouth 2 (two) times daily.     furosemide (LASIX) 40 MG tablet Take 40 mg by mouth daily.      gabapentin (NEURONTIN) 300 MG capsule Take 300-600 mg by mouth See admin instructions. Take 300 mg by mouth and 600 mg at  bedtime     glimepiride (AMARYL) 1 MG tablet Take 1 tablet (1 mg total) by mouth daily with breakfast. 30 tablet 2   hydrOXYzine (ATARAX/VISTARIL) 25 MG tablet Take 25 mg by mouth daily.     levothyroxine (SYNTHROID, LEVOTHROID) 137 MCG tablet      Linaclotide (LINZESS) 145 MCG CAPS capsule Take 145 mcg by mouth daily.     loratadine (CLARITIN) 10 MG tablet Take 10 mg by mouth daily.     meclizine (ANTIVERT) 12.5 MG tablet      metoprolol succinate (TOPROL-XL) 25 MG 24 hr tablet Take 25 mg by mouth daily.     mupirocin ointment (BACTROBAN) 2 %      nitroGLYCERIN (NITROSTAT) 0.4 MG SL tablet Place 0.4 mg under the tongue every 5 (five) minutes as needed for chest pain.      omeprazole (PRILOSEC) 20 MG capsule Take 20 mg by mouth 2 (two) times daily before a meal.      potassium chloride SA (K-DUR,KLOR-CON) 20 MEQ tablet Take 20 mEq by mouth 2 (two) times daily. Pt takes once  day     rosuvastatin (CRESTOR) 20 MG tablet Take 20 mg by mouth daily.     sertraline (ZOLOFT) 100 MG tablet Take 50-100 mg by mouth See admin instructions. Take 100 mg in the am and 50 mg at bedtime.     ondansetron (ZOFRAN-ODT) 4 MG disintegrating tablet      sucralfate (CARAFATE) 1 g tablet Take 1 g by mouth 3 (three) times daily.     sulfamethoxazole-trimethoprim (BACTRIM DS) 800-160 MG tablet Take 1 tablet by mouth 2 (two) times daily. 10 tablet 0   No current facility-administered medications for this visit.      PHYSICAL EXAMINATION: ECOG PERFORMANCE STATUS: 1 - Symptomatic but completely ambulatory Vitals:   10/05/18 1329  BP: 115/82  Pulse: 79  Resp: 18  Temp: (!) 96.6 F (35.9 C)   Filed Weights   10/05/18 1329  Weight: 174 lb 14.4 oz (79.3 kg)    Physical Exam Constitutional:      General: She is not in acute distress.    Comments: Walk in  HENT:     Head: Normocephalic and atraumatic.  Eyes:     General: No scleral icterus.    Pupils: Pupils are equal, round, and reactive  to light.  Neck:     Musculoskeletal: Normal range of  motion and neck supple.  Cardiovascular:     Rate and Rhythm: Normal rate and regular rhythm.     Heart sounds: Normal heart sounds.  Pulmonary:     Effort: Pulmonary effort is normal. No respiratory distress.     Breath sounds: No wheezing.  Abdominal:     General: Bowel sounds are normal. There is no distension.     Palpations: Abdomen is soft. There is no mass.     Tenderness: There is no abdominal tenderness.  Musculoskeletal: Normal range of motion.        General: No deformity.     Comments: Right lower extremity erythema and swelling, no calf tenderness. Chronic varicose veins  Skin:    General: Skin is warm and dry.     Findings: No erythema or rash.  Neurological:     Mental Status: She is alert and oriented to person, place, and time.     Cranial Nerves: No cranial nerve deficit.     Coordination: Coordination normal.  Psychiatric:        Behavior: Behavior normal.        Thought Content: Thought content normal.        LABORATORY DATA:  I have reviewed the data as listed Lab Results  Component Value Date   WBC 5.8 10/05/2018   HGB 12.4 10/05/2018   HCT 38.8 10/05/2018   MCV 92.4 10/05/2018   PLT 145 (L) 10/05/2018   Recent Labs    02/05/18 1614 05/02/18 1306 10/05/18 1308  NA 140 139 141  K 3.8 3.2* 3.5  CL 98 107 103  CO2 32 26 27  GLUCOSE 140* 159* 167*  BUN 20 12 17   CREATININE 0.98 0.78 0.84  CALCIUM 9.0 8.6* 9.2  GFRNONAA 53* >60 >60  GFRAA >60 >60 >60  PROT 7.1 6.6 7.6  ALBUMIN 3.9 3.9 4.1  AST 27 27 28   ALT 12 12 12   ALKPHOS 91 96 108  BILITOT 0.3 0.4 0.4   Iron/TIBC/Ferritin/ %Sat No results found for: IRON, TIBC, FERRITIN, IRONPCTSAT   RADIOGRAPHIC STUDIES: I have personally reviewed the radiological images as listed and agreed with the findings in the report. 02/12/2018 CT chest abdomen pelvis with contrast showed no adenopathy or other evidence of metastatic disease in the  abdomen or pelvis.  Mild left supraclavicular lymphadenopathy, obscured by streak artifact from left chest pacemaker. Several pulmonary nodules, indeterminate.  Nonspecific asymmetric mild right anal wall thickening and hyperenhancement.  Postsurgical changes. 02/20/2018 No findings identified to suggest a residual/recurrent tumor or hypermetabolic metastasis.  There is no abnormal hypermetabolic is not identified within the left supraclavicular region.  Distal rectum and anus mild radiotracer uptake.  Nonspecific.  Aortic atherosclerosis.  Multivessel coronary artery atherosclerosis calcification.  Hiatal hernia.  ASSESSMENT & PLAN:  1. Anal cancer ([place])   2. Swelling of right lower extremity    pT1,cN0 M0. Status post definitive radiation treatment. Recommend patient to continue follow-up with surgery for digital anal examination every 3 to 6 months for 5 years. Anoscopy every 6 to 12 months for 3 years. Recommend CT chest abdomen pelvis with contrast annually for 3 years.-She is due for CT chest abdomen pelvis at the end of this year.  Right lower extremity swelling after bug bite Possible cellulitis.  Rule out DVT.  I will obtain stat ultrasound venous duplex right lower extremity to rule out DVT. Study was reviewed.  Negative for DVT. I prescribed Bactrim DS twice daily for 5 days. I recommend patient  to call primary care physician's office and have a follow-up in 1 week for evaluation of cellulitis. She voices understanding.  #Multiple small lung nodules on initial staging CT. attention on follow-up CT scans.  Orders Placed This Encounter  Procedures   US Venous Img Lower Unilateral Right    Patient can leave after scan per provider    Standing Status:   Future    Number of Occurrences:   1    Standing Expiration Date:   10/05/2019    Order Specific Question:   Reason for Exam (SYMPTOM  OR DIAGNOSIS REQUIRED)    Answer:   leg swelling right    Order Specific Question:   Preferred  imaging location?    Answer:   [facility]    Order Specific Question:   Call Results- Best Contact Number?    Answer:   [phone]   CT Abdomen Pelvis W Contrast    Standing Status:   Future    Standing Expiration Date:   10/05/2019    Order Specific Question:   ** REASON FOR EXAM (FREE TEXT)    Answer:   Stage 1 Anal caner followup    Order Specific Question:   If indicated for the ordered procedure, I authorize the administration of contrast media per Radiology protocol    Answer:   Yes    Order Specific Question:   Preferred imaging location?    Answer:   [facility]    Order Specific Question:   Is Oral Contrast requested for this exam?    Answer:   Yes, Per Radiology protocol    Order Specific Question:   Radiology Contrast Protocol - do NOT remove file path    Answer:   \[email or web address]   CT Chest W Contrast    Standing Status:   Future    Standing Expiration Date:   10/05/2019    Order Specific Question:   ** REASON FOR EXAM (FREE TEXT)    Answer:   anal cancer    Order Specific Question:   If indicated for the ordered procedure, I authorize the administration of contrast media per Radiology protocol    Answer:   Yes    Order Specific Question:   Preferred imaging location?    Answer:   [facility]    Order Specific Question:   Radiology Contrast Protocol - do NOT remove file path    Answer:   \[email or web address]   CBC with Differential    Standing Status:   Future    Standing Expiration Date:   10/05/2019   Comprehensive metabolic panel    Standing Status:   Future    Standing Expiration Date:   10/05/2019    All questions were answered. The patient knows to call the clinic with any problems questions or concerns.  Return of visit: 6 months   Earlie Server, MD, Hematology Oncology [facility] Pager- [phone] 10/05/2018

## 2018-10-08 ENCOUNTER — Telehealth: Payer: Self-pay | Admitting: Internal Medicine

## 2018-10-08 ENCOUNTER — Telehealth: Payer: Self-pay | Admitting: *Deleted

## 2018-10-08 NOTE — Telephone Encounter (Signed)
Patient called asking for results, Any new orders?  IMPRESSION: No evidence of DVT within the right lower extremity.   Electronically Signed   By: Sandi Mariscal M.D.   On: 10/05/2018 16:20

## 2018-10-08 NOTE — Telephone Encounter (Signed)
Advise she to complete antibiotics and follow up with PCP. Thanks.

## 2018-10-08 NOTE — Telephone Encounter (Signed)
Spoke to patient/daughter; has not started yet on Bactrim [awaiting delivery from the pharmacy tonight].  Offered to call to different pharmacy-they want to wait.  Reviewed the results of the ultrasound-negative for blood clots.  If medication undelivered tonight-recommend call us first thing in the morning tomorrow for the prescription to be sent to a different pharmacy

## 2018-10-09 ENCOUNTER — Encounter: Payer: Self-pay | Admitting: General Surgery

## 2018-10-09 NOTE — Telephone Encounter (Signed)
Patient informed of doctor response and she thanked me for calling her back

## 2018-10-11 ENCOUNTER — Emergency Department: Payer: Medicare Other

## 2018-10-11 ENCOUNTER — Other Ambulatory Visit: Payer: Self-pay

## 2018-10-11 ENCOUNTER — Encounter: Payer: Self-pay | Admitting: Emergency Medicine

## 2018-10-11 ENCOUNTER — Emergency Department
Admission: EM | Admit: 2018-10-11 | Discharge: 2018-10-11 | Disposition: A | Discharge status: Home / Self Care | Payer: Medicare Other | Attending: Emergency Medicine | Admitting: Emergency Medicine

## 2018-10-11 ENCOUNTER — Telehealth: Payer: Self-pay

## 2018-10-11 DIAGNOSIS — I252 Old myocardial infarction: Secondary | ICD-10-CM | POA: Diagnosis not present

## 2018-10-11 DIAGNOSIS — Z951 Presence of aortocoronary bypass graft: Secondary | ICD-10-CM | POA: Diagnosis not present

## 2018-10-11 DIAGNOSIS — Z8542 Personal history of malignant neoplasm of other parts of uterus: Secondary | ICD-10-CM | POA: Insufficient documentation

## 2018-10-11 DIAGNOSIS — G20 Parkinson's disease: Secondary | ICD-10-CM | POA: Diagnosis not present

## 2018-10-11 DIAGNOSIS — Z7984 Long term (current) use of oral hypoglycemic drugs: Secondary | ICD-10-CM | POA: Diagnosis not present

## 2018-10-11 DIAGNOSIS — R600 Localized edema: Secondary | ICD-10-CM | POA: Diagnosis not present

## 2018-10-11 DIAGNOSIS — E119 Type 2 diabetes mellitus without complications: Secondary | ICD-10-CM | POA: Insufficient documentation

## 2018-10-11 DIAGNOSIS — Z7982 Long term (current) use of aspirin: Secondary | ICD-10-CM | POA: Diagnosis not present

## 2018-10-11 DIAGNOSIS — R2241 Localized swelling, mass and lump, right lower limb: Secondary | ICD-10-CM | POA: Diagnosis present

## 2018-10-11 DIAGNOSIS — E039 Hypothyroidism, unspecified: Secondary | ICD-10-CM | POA: Diagnosis not present

## 2018-10-11 DIAGNOSIS — R1030 Lower abdominal pain, unspecified: Secondary | ICD-10-CM | POA: Diagnosis not present

## 2018-10-11 DIAGNOSIS — R609 Edema, unspecified: Secondary | ICD-10-CM

## 2018-10-11 DIAGNOSIS — Z8673 Personal history of transient ischemic attack (TIA), and cerebral infarction without residual deficits: Secondary | ICD-10-CM | POA: Diagnosis not present

## 2018-10-11 DIAGNOSIS — I2510 Atherosclerotic heart disease of native coronary artery without angina pectoris: Secondary | ICD-10-CM | POA: Insufficient documentation

## 2018-10-11 LAB — CBC WITH DIFFERENTIAL/PLATELET
Abs Immature Granulocytes: 0.01 10*3/uL (ref 0.00–0.07)
Basophils Absolute: 0 10*3/uL (ref 0.0–0.1)
Basophils Relative: 0 %
Eosinophils Absolute: 0.1 10*3/uL (ref 0.0–0.5)
Eosinophils Relative: 2 %
HCT: 34.6 % — ABNORMAL LOW (ref 36.0–46.0)
Hemoglobin: 10.9 g/dL — ABNORMAL LOW (ref 12.0–15.0)
Immature Granulocytes: 0 %
Lymphocytes Relative: 22 %
Lymphs Abs: 1.1 10*3/uL (ref 0.7–4.0)
MCH: 28.9 pg (ref 26.0–34.0)
MCHC: 31.5 g/dL (ref 30.0–36.0)
MCV: 91.8 fL (ref 80.0–100.0)
Monocytes Absolute: 0.3 10*3/uL (ref 0.1–1.0)
Monocytes Relative: 6 %
Neutro Abs: 3.5 10*3/uL (ref 1.7–7.7)
Neutrophils Relative %: 70 %
Platelets: 138 10*3/uL — ABNORMAL LOW (ref 150–400)
RBC: 3.77 MIL/uL — ABNORMAL LOW (ref 3.87–5.11)
RDW: 14.4 % (ref 11.5–15.5)
WBC: 5 10*3/uL (ref 4.0–10.5)
nRBC: 0 % (ref 0.0–0.2)

## 2018-10-11 LAB — COMPREHENSIVE METABOLIC PANEL
ALT: 5 U/L (ref 0–44)
AST: 19 U/L (ref 15–41)
Albumin: 4 g/dL (ref 3.5–5.0)
Alkaline Phosphatase: 99 U/L (ref 38–126)
Anion gap: 9 (ref 5–15)
BUN: 20 mg/dL (ref 8–23)
CO2: 25 mmol/L (ref 22–32)
Calcium: 8.5 mg/dL — ABNORMAL LOW (ref 8.9–10.3)
Chloride: 99 mmol/L (ref 98–111)
Creatinine, Ser: 1.19 mg/dL — ABNORMAL HIGH (ref 0.44–1.00)
GFR calc Af Amer: 50 mL/min — ABNORMAL LOW (ref >60)
GFR calc non Af Amer: 43 mL/min — ABNORMAL LOW (ref >60)
Glucose, Bld: 168 mg/dL — ABNORMAL HIGH (ref 70–99)
Potassium: 4 mmol/L (ref 3.5–5.1)
Sodium: 133 mmol/L — ABNORMAL LOW (ref 135–145)
Total Bilirubin: 0.3 mg/dL (ref 0.3–1.2)
Total Protein: 7 g/dL (ref 6.5–8.1)

## 2018-10-11 LAB — TROPONIN I (HIGH SENSITIVITY): Troponin I (High Sensitivity): 13 ng/L (ref <18)

## 2018-10-11 MED ORDER — IOHEXOL 240 MG/ML SOLN
50.0000 mL | Freq: Once | INTRAMUSCULAR | Status: AC | PRN
  Administered 2018-10-11: 50 mL via ORAL

## 2018-10-11 MED ORDER — MEDICAL COMPRESSION STOCKINGS MISC: 0 refills | Status: DC

## 2018-10-11 MED ORDER — IOHEXOL 300 MG/ML  SOLN
75.0000 mL | Freq: Once | INTRAMUSCULAR | Status: AC | PRN
  Administered 2018-10-11: 75 mL via INTRAVENOUS

## 2018-10-11 MED ORDER — SODIUM CHLORIDE 0.9% FLUSH: 3.0000 mL | Freq: Once | INTRAVENOUS | Status: DC
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
## 2018-10-11 NOTE — Telephone Encounter (Signed)
Patient called stating her leg remains swollen, red/purple in color, and hot to touch.  She has been taking the antibiotics as prescribed.  Notified Dr. Tasia Catchings.  Patient denies fever/chills/cough/SOB.  Dr. Tasia Catchings requests patient to go to ER for further evaluation.  Patient notified and states she will go to ER after call.
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
## 2018-10-11 NOTE — ED Notes (Signed)
Removed 20G PIV from RAC placed by CT per pt.

## 2018-10-11 NOTE — ED Triage Notes (Signed)
Swelling right leg for several days.  Was given antibiotic by her cancer doctor and had ultrasound doneand it was negative.  She says her doctor sent here here.

## 2018-10-11 NOTE — ED Notes (Signed)

## 2018-11-15 ENCOUNTER — Ambulatory Visit (INDEPENDENT_AMBULATORY_CARE_PROVIDER_SITE_OTHER): Payer: Medicare Other | Admitting: Nurse Practitioner

## 2018-11-15 ENCOUNTER — Other Ambulatory Visit: Payer: Self-pay

## 2018-11-15 ENCOUNTER — Encounter (INDEPENDENT_AMBULATORY_CARE_PROVIDER_SITE_OTHER): Payer: Self-pay | Admitting: Nurse Practitioner

## 2018-11-15 VITALS — BP 126/83 | HR 87 | Resp 16 | Ht 66.0 in | Wt 179.0 lb

## 2018-11-15 DIAGNOSIS — Z794 Long term (current) use of insulin: Secondary | ICD-10-CM | POA: Insufficient documentation

## 2018-11-15 DIAGNOSIS — I890 Lymphedema, not elsewhere classified: Secondary | ICD-10-CM | POA: Diagnosis not present

## 2018-11-15 DIAGNOSIS — E782 Mixed hyperlipidemia: Secondary | ICD-10-CM

## 2018-11-15 DIAGNOSIS — G4700 Insomnia, unspecified: Secondary | ICD-10-CM | POA: Insufficient documentation

## 2018-11-15 DIAGNOSIS — E1169 Type 2 diabetes mellitus with other specified complication: Secondary | ICD-10-CM | POA: Insufficient documentation

## 2018-11-15 DIAGNOSIS — E11649 Type 2 diabetes mellitus with hypoglycemia without coma: Secondary | ICD-10-CM | POA: Insufficient documentation

## 2018-11-15 DIAGNOSIS — K219 Gastro-esophageal reflux disease without esophagitis: Secondary | ICD-10-CM | POA: Diagnosis not present

## 2018-11-15 DIAGNOSIS — I10 Essential (primary) hypertension: Secondary | ICD-10-CM

## 2018-11-15 DIAGNOSIS — E119 Type 2 diabetes mellitus without complications: Secondary | ICD-10-CM | POA: Insufficient documentation
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
## 2018-11-19 DIAGNOSIS — R262 Difficulty in walking, not elsewhere classified: Secondary | ICD-10-CM | POA: Insufficient documentation

## 2018-11-20 ENCOUNTER — Observation Stay
Admission: EM | Admit: 2018-11-20 | Discharge: 2018-11-21 | Disposition: A | Discharge status: Home / Self Care | Payer: Medicare Other | Attending: Internal Medicine | Admitting: Internal Medicine

## 2018-11-20 ENCOUNTER — Emergency Department: Payer: Medicare Other

## 2018-11-20 ENCOUNTER — Other Ambulatory Visit: Payer: Self-pay

## 2018-11-20 ENCOUNTER — Other Ambulatory Visit: Payer: Self-pay | Admitting: Student

## 2018-11-20 ENCOUNTER — Other Ambulatory Visit (HOSPITAL_COMMUNITY): Payer: Self-pay | Admitting: Student

## 2018-11-20 DIAGNOSIS — R143 Flatulence: Secondary | ICD-10-CM

## 2018-11-20 DIAGNOSIS — I2510 Atherosclerotic heart disease of native coronary artery without angina pectoris: Secondary | ICD-10-CM | POA: Insufficient documentation

## 2018-11-20 DIAGNOSIS — I209 Angina pectoris, unspecified: Secondary | ICD-10-CM

## 2018-11-20 DIAGNOSIS — K219 Gastro-esophageal reflux disease without esophagitis: Secondary | ICD-10-CM | POA: Insufficient documentation

## 2018-11-20 DIAGNOSIS — K589 Irritable bowel syndrome without diarrhea: Secondary | ICD-10-CM | POA: Diagnosis not present

## 2018-11-20 DIAGNOSIS — Z951 Presence of aortocoronary bypass graft: Secondary | ICD-10-CM | POA: Insufficient documentation

## 2018-11-20 DIAGNOSIS — R112 Nausea with vomiting, unspecified: Secondary | ICD-10-CM

## 2018-11-20 DIAGNOSIS — G20 Parkinson's disease: Secondary | ICD-10-CM | POA: Insufficient documentation

## 2018-11-20 DIAGNOSIS — I700 Atherosclerosis of aorta: Secondary | ICD-10-CM | POA: Diagnosis not present

## 2018-11-20 DIAGNOSIS — Z880 Allergy status to penicillin: Secondary | ICD-10-CM | POA: Insufficient documentation

## 2018-11-20 DIAGNOSIS — E785 Hyperlipidemia, unspecified: Secondary | ICD-10-CM | POA: Insufficient documentation

## 2018-11-20 DIAGNOSIS — Z79891 Long term (current) use of opiate analgesic: Secondary | ICD-10-CM | POA: Diagnosis not present

## 2018-11-20 DIAGNOSIS — E876 Hypokalemia: Secondary | ICD-10-CM | POA: Insufficient documentation

## 2018-11-20 DIAGNOSIS — Z79899 Other long term (current) drug therapy: Secondary | ICD-10-CM | POA: Insufficient documentation

## 2018-11-20 DIAGNOSIS — R140 Abdominal distension (gaseous): Secondary | ICD-10-CM

## 2018-11-20 DIAGNOSIS — Z7982 Long term (current) use of aspirin: Secondary | ICD-10-CM | POA: Insufficient documentation

## 2018-11-20 DIAGNOSIS — I252 Old myocardial infarction: Secondary | ICD-10-CM | POA: Insufficient documentation

## 2018-11-20 DIAGNOSIS — C211 Malignant neoplasm of anal canal: Secondary | ICD-10-CM

## 2018-11-20 DIAGNOSIS — M1990 Unspecified osteoarthritis, unspecified site: Secondary | ICD-10-CM | POA: Insufficient documentation

## 2018-11-20 DIAGNOSIS — Z7984 Long term (current) use of oral hypoglycemic drugs: Secondary | ICD-10-CM | POA: Diagnosis not present

## 2018-11-20 DIAGNOSIS — Z20828 Contact with and (suspected) exposure to other viral communicable diseases: Secondary | ICD-10-CM | POA: Diagnosis not present

## 2018-11-20 DIAGNOSIS — K625 Hemorrhage of anus and rectum: Secondary | ICD-10-CM

## 2018-11-20 DIAGNOSIS — K2970 Gastritis, unspecified, without bleeding: Secondary | ICD-10-CM | POA: Diagnosis not present

## 2018-11-20 DIAGNOSIS — E119 Type 2 diabetes mellitus without complications: Secondary | ICD-10-CM | POA: Insufficient documentation

## 2018-11-20 DIAGNOSIS — K449 Diaphragmatic hernia without obstruction or gangrene: Secondary | ICD-10-CM | POA: Insufficient documentation

## 2018-11-20 DIAGNOSIS — F329 Major depressive disorder, single episode, unspecified: Secondary | ICD-10-CM | POA: Diagnosis not present

## 2018-11-20 DIAGNOSIS — F0390 Unspecified dementia without behavioral disturbance: Secondary | ICD-10-CM | POA: Diagnosis not present

## 2018-11-20 DIAGNOSIS — R079 Chest pain, unspecified: Secondary | ICD-10-CM | POA: Diagnosis not present

## 2018-11-20 DIAGNOSIS — I10 Essential (primary) hypertension: Secondary | ICD-10-CM | POA: Insufficient documentation

## 2018-11-20 DIAGNOSIS — F419 Anxiety disorder, unspecified: Secondary | ICD-10-CM | POA: Diagnosis not present

## 2018-11-20 DIAGNOSIS — R197 Diarrhea, unspecified: Secondary | ICD-10-CM

## 2018-11-20 DIAGNOSIS — I351 Nonrheumatic aortic (valve) insufficiency: Secondary | ICD-10-CM | POA: Insufficient documentation

## 2018-11-20 DIAGNOSIS — Z955 Presence of coronary angioplasty implant and graft: Secondary | ICD-10-CM | POA: Insufficient documentation

## 2018-11-20 DIAGNOSIS — Z79890 Hormone replacement therapy (postmenopausal): Secondary | ICD-10-CM | POA: Insufficient documentation

## 2018-11-20 DIAGNOSIS — Z8542 Personal history of malignant neoplasm of other parts of uterus: Secondary | ICD-10-CM | POA: Insufficient documentation

## 2018-11-20 DIAGNOSIS — G8929 Other chronic pain: Secondary | ICD-10-CM

## 2018-11-20 DIAGNOSIS — Z950 Presence of cardiac pacemaker: Secondary | ICD-10-CM | POA: Insufficient documentation

## 2018-11-20 DIAGNOSIS — E039 Hypothyroidism, unspecified: Secondary | ICD-10-CM | POA: Diagnosis not present

## 2018-11-20 LAB — SARS CORONAVIRUS 2 BY RT PCR (HOSPITAL ORDER, PERFORMED IN ~~LOC~~ HOSPITAL LAB): SARS Coronavirus 2: NEGATIVE

## 2018-11-20 LAB — BASIC METABOLIC PANEL
Anion gap: 10 (ref 5–15)
BUN: 13 mg/dL (ref 8–23)
CO2: 29 mmol/L (ref 22–32)
Calcium: 9.1 mg/dL (ref 8.9–10.3)
Chloride: 102 mmol/L (ref 98–111)
Creatinine, Ser: 1.01 mg/dL — ABNORMAL HIGH (ref 0.44–1.00)
GFR calc Af Amer: >60 mL/min (ref >60)
GFR calc non Af Amer: 53 mL/min — ABNORMAL LOW (ref >60)
Glucose, Bld: 198 mg/dL — ABNORMAL HIGH (ref 70–99)
Potassium: 3.4 mmol/L — ABNORMAL LOW (ref 3.5–5.1)
Sodium: 141 mmol/L (ref 135–145)

## 2018-11-20 LAB — CBC
HCT: 38 % (ref 36.0–46.0)
Hemoglobin: 11.8 g/dL — ABNORMAL LOW (ref 12.0–15.0)
MCH: 28.7 pg (ref 26.0–34.0)
MCHC: 31.1 g/dL (ref 30.0–36.0)
MCV: 92.5 fL (ref 80.0–100.0)
Platelets: 151 10*3/uL (ref 150–400)
RBC: 4.11 MIL/uL (ref 3.87–5.11)
RDW: 14.7 % (ref 11.5–15.5)
WBC: 5.2 10*3/uL (ref 4.0–10.5)
nRBC: 0 % (ref 0.0–0.2)

## 2018-11-20 LAB — GLUCOSE, CAPILLARY: Glucose-Capillary: 105 mg/dL — ABNORMAL HIGH (ref 70–99)

## 2018-11-20 LAB — TROPONIN I (HIGH SENSITIVITY)
Troponin I (High Sensitivity): 9 ng/L (ref <18)
Troponin I (High Sensitivity): 10 ng/L (ref <18)
Troponin I (High Sensitivity): 11 ng/L (ref <18)

## 2018-11-20 MED ORDER — SERTRALINE HCL 50 MG PO TABS
50.0000 mg | ORAL_TABLET | Freq: Every day | ORAL | Status: DC
  Administered 2018-11-20: 50 mg via ORAL
  Filled 2018-11-20: qty 1

## 2018-11-20 MED ORDER — FUROSEMIDE 40 MG PO TABS: 40.0000 mg | ORAL_TABLET | Freq: Every day | ORAL | Status: DC

## 2018-11-20 MED ORDER — DOCUSATE SODIUM 100 MG PO CAPS
100.0000 mg | ORAL_CAPSULE | Freq: Two times a day (BID) | ORAL | Status: DC
  Administered 2018-11-20 – 2018-11-21 (×2): 100 mg via ORAL
  Filled 2018-11-20 (×2): qty 1

## 2018-11-20 MED ORDER — LEVOTHYROXINE SODIUM 137 MCG PO TABS
137.0000 ug | ORAL_TABLET | Freq: Every day | ORAL | Status: DC
  Administered 2018-11-21: 137 ug via ORAL
  Filled 2018-11-20: qty 1

## 2018-11-20 MED ORDER — POTASSIUM CHLORIDE CRYS ER 20 MEQ PO TBCR
20.0000 meq | EXTENDED_RELEASE_TABLET | Freq: Two times a day (BID) | ORAL | Status: DC
  Administered 2018-11-20 – 2018-11-21 (×2): 20 meq via ORAL
  Filled 2018-11-20 (×2): qty 1

## 2018-11-20 MED ORDER — ROSUVASTATIN CALCIUM 10 MG PO TABS: 20.0000 mg | ORAL_TABLET | Freq: Every day | ORAL | Status: DC

## 2018-11-20 MED ORDER — SUCRALFATE 1 G PO TABS
1.0000 g | ORAL_TABLET | Freq: Three times a day (TID) | ORAL | Status: DC
  Administered 2018-11-20 – 2018-11-21 (×2): 1 g via ORAL
  Filled 2018-11-20 (×2): qty 1

## 2018-11-20 MED ORDER — LINACLOTIDE 145 MCG PO CAPS
145.0000 ug | ORAL_CAPSULE | Freq: Every day | ORAL | Status: DC
  Administered 2018-11-21: 145 ug via ORAL
  Filled 2018-11-20: qty 1

## 2018-11-20 MED ORDER — CARBIDOPA-LEVODOPA 25-100 MG PO TABS
1.0000 | ORAL_TABLET | Freq: Three times a day (TID) | ORAL | Status: DC
  Administered 2018-11-20 – 2018-11-21 (×2): 1 via ORAL
  Filled 2018-11-20 (×4): qty 1

## 2018-11-20 MED ORDER — SERTRALINE HCL 50 MG PO TABS
100.0000 mg | ORAL_TABLET | Freq: Every day | ORAL | Status: DC
  Administered 2018-11-21: 100 mg via ORAL
  Filled 2018-11-20: qty 2

## 2018-11-20 MED ORDER — HYDROXYZINE HCL 25 MG PO TABS
25.0000 mg | ORAL_TABLET | Freq: Every day | ORAL | Status: DC
  Administered 2018-11-21: 25 mg via ORAL
  Filled 2018-11-20: qty 1

## 2018-11-20 MED ORDER — ASPIRIN EC 81 MG PO TBEC
81.0000 mg | DELAYED_RELEASE_TABLET | Freq: Every day | ORAL | Status: DC
  Administered 2018-11-21: 81 mg via ORAL
  Filled 2018-11-20: qty 1

## 2018-11-20 MED ORDER — METOPROLOL SUCCINATE ER 25 MG PO TB24: 25.0000 mg | ORAL_TABLET | Freq: Every day | ORAL | Status: DC

## 2018-11-20 MED ORDER — ASPIRIN 81 MG PO CHEW: 324.0000 mg | CHEWABLE_TABLET | Freq: Once | ORAL | Status: DC

## 2018-11-20 MED ORDER — PANTOPRAZOLE SODIUM 40 MG PO TBEC
40.0000 mg | DELAYED_RELEASE_TABLET | Freq: Every day | ORAL | Status: DC
  Administered 2018-11-20 – 2018-11-21 (×2): 40 mg via ORAL
  Filled 2018-11-20 (×2): qty 1

## 2018-11-20 MED ORDER — DOCUSATE SODIUM 100 MG PO CAPS: 100.0000 mg | ORAL_CAPSULE | Freq: Two times a day (BID) | ORAL | Status: DC | PRN

## 2018-11-20 MED ORDER — GABAPENTIN 300 MG PO CAPS
600.0000 mg | ORAL_CAPSULE | Freq: Every day | ORAL | Status: DC
  Administered 2018-11-20: 600 mg via ORAL
  Filled 2018-11-20: qty 2

## 2018-11-20 MED ORDER — HEPARIN SODIUM (PORCINE) 5000 UNIT/ML IJ SOLN
5000.0000 [IU] | Freq: Three times a day (TID) | INTRAMUSCULAR | Status: DC
  Administered 2018-11-20 – 2018-11-21 (×2): 5000 [IU] via SUBCUTANEOUS
  Filled 2018-11-20 (×2): qty 1

## 2018-11-20 MED ORDER — SODIUM CHLORIDE 0.9% FLUSH: 3.0000 mL | Freq: Once | INTRAVENOUS | Status: DC

## 2018-11-20 MED ORDER — CARVEDILOL 3.125 MG PO TABS: 3.1250 mg | ORAL_TABLET | Freq: Every day | ORAL | Status: DC

## 2018-11-20 MED ORDER — GABAPENTIN 300 MG PO CAPS
300.0000 mg | ORAL_CAPSULE | Freq: Every day | ORAL | Status: DC
  Administered 2018-11-21: 300 mg via ORAL
  Filled 2018-11-20: qty 1

## 2018-11-20 NOTE — ED Notes (Signed)
Attempted to call report without success

## 2018-11-20 NOTE — ED Notes (Addendum)
First Nurse Note; Pt in via [facility] from home with complaints 10/10 chest pain, reports taking 2 Nitro at home with some relief.  Pt given 3 81mg  Aspirin per EMS.  20G PIV to right AC saline locked upon arrival.

## 2018-11-20 NOTE — ED Provider Notes (Signed)
[facility] Emergency Department Provider Note  ____________________________________________  Time seen: Approximately 6:30 PM  I have reviewed the triage vital signs and the nursing notes.   HISTORY  Chief Complaint Chest Pain    HPI Kristi [name] is a 78 y.o. female with a history of anxiety, uterine cancer, CAD status post MI, Parkinson's disease, cardiac pacemaker who comes the ED after an episode of chest pain.  She was in her usual state of health at about 3:00 PM she had sudden onset of central chest pain described as dull, radiating to left arm, associated shortness of breath diaphoresis and nausea.  No palpitations or syncope.  She took 2 nitroglycerin and called EMS, and by the time they arrived her pain had resolved.  Pain lasted a total of about 15 minutes was constant during that time, no aggravating factors.  She also notes that she sometimes gets short of breath with walking. Patient has had a total of 324 mg of aspirin already today from her home medication and EMS    Past Medical History:  Diagnosis Date  . Anxiety   . Arthritis   . Broken arm    left FA 3/17  . Broken arm    left  . CAD (coronary artery disease)    s/p MI  . Cancer ([place])    uterine  . Cervical disc disorder   . Depression   . Diabetes mellitus without complication ([place])   . GERD (gastroesophageal reflux disease)   . Headache   . Hyperlipidemia   . Hypertension   . Hypothyroid   . Myocardial infarction ([place])    15 year ago  . Neck pain 06/04/2014  . Parkinson's disease ([place])   . Presence of permanent cardiac pacemaker   . Spinal stenosis      Patient Active Problem List   Diagnosis Date Noted  . Insomnia 11/15/2018  . Type 2 diabetes mellitus ([place]) 11/15/2018  . Goals of care, counseling/discussion 02/06/2018  . 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 Rectal mass 01/05/2018  . Leg pain, bilateral 12/06/2017  . Lymphedema 07/10/2017  . Stricture and  stenosis of esophagus   . Dysphagia   . Syncope and collapse 04/11/2017  . Mild dementia ([place]) 01/24/2017  . Lumbar facet osteoarthritis (Bilateral) 01/17/2017  . Acute postoperative pain 01/17/2017  . Hypokalemia 12/24/2016  . Anxiety 12/07/2016  . 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 Cerebrovascular accident ([place]) 12/07/2016  . Chronic depression 12/07/2016  . Essential hypertension 12/07/2016  . Hypothyroidism 12/07/2016  . Mixed hyperlipidemia 12/07/2016  . Myocardial infarction ([place]) 12/07/2016  . TIA (transient ischemic attack) 11/17/2016  . Parkinson disease ([place]) 10/27/2016  . Tremor 10/27/2016  . Chronic knee pain (B) (R>L) 10/19/2016  . Chest pain 09/29/2016  . Abnormal CT scan, lumbar spine 09/08/2016  . Lumbar foraminal stenosis (multilevel) 09/08/2016  . Fracture of clavicle 08/03/2016  . Polyneuropathy 05/17/2016  . Neurogenic pain 04/12/2016  . Chronic lower extremity pain (Bilateral) (R>L) 04/12/2016  . Chronic lower extremity radicular pain (Primary Source of Pain) (Bilateral) (R>L) 04/12/2016  . Lower extremity weakness 04/12/2016  . Chronic low back pain (Secondary source of pain) (Bilateral) (R>L) 04/12/2016  . Chronic wrist pain (Left) (secondary to fracture) 04/12/2016  . Lumbar spondylosis 04/12/2016  . Chronic pain syndrome 04/11/2016  . Long term current use of opiate analgesic 04/11/2016  . Long term prescription opiate use 04/11/2016  . Opiate use 04/11/2016  . Long term prescription benzodiazepine  use 04/11/2016  . Lumbar radiculopathy (Multilevel) (Bilateral) 05/07/2015  . Lumbar facet syndrome (Bilateral) (R>L) 08/21/2014  . 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 Sacroiliac joint dysfunction (Bilateral) 08/13/2014  . Frequent falls 06/04/2014  . Memory loss 06/04/2014  . Anxiety and depression 08/22/2013  . CAD (coronary artery disease) 08/22/2013  . Therapeutic opioid-induced constipation (OIC) 08/22/2013   . GERD (gastroesophageal reflux disease) 08/22/2013     Past Surgical History:  Procedure Laterality Date  . ABDOMINAL HYSTERECTOMY     partial  . BREAST SURGERY    . COLONOSCOPY    . COLONOSCOPY WITH PROPOFOL N/A 11/29/2017   Procedure: COLONOSCOPY WITH PROPOFOL;  Surgeon: [name], MD;  Location: [facility] ENDOSCOPY;  Service: Endoscopy;  Laterality: N/A;  . CORONARY ANGIOPLASTY     stent  . CORONARY ARTERY BYPASS GRAFT    . CORONARY STENT PLACEMENT    . ESOPHAGOGASTRODUODENOSCOPY (EGD) WITH PROPOFOL N/A 04/18/2017   Procedure: ESOPHAGOGASTRODUODENOSCOPY (EGD) WITH PROPOFOL;  Surgeon: [name], MD;  Location: [facility] ENDOSCOPY;  Service: Endoscopy;  Laterality: N/A;  . ESOPHAGOGASTRODUODENOSCOPY (EGD) WITH PROPOFOL N/A 11/29/2017   Procedure: ESOPHAGOGASTRODUODENOSCOPY (EGD) WITH PROPOFOL;  Surgeon: [name], MD;  Location: [facility] ENDOSCOPY;  Service: Endoscopy;  Laterality: N/A;  . INSERT / REPLACE / REMOVE PACEMAKER    . s/p pacer insertion    . TRANSANAL EXCISION OF RECTAL MASS N/A 01/17/2018   Procedure: TRANSANAL EXCISION OF RECTAL POLYP;  Surgeon: [name], MD;  Location: [facility] ORS;  Service: General;  Laterality: N/A;     Prior to Admission medications   Medication Sig Start Date End Date Taking? Authorizing Provider  aspirin EC 81 MG tablet Take 81 mg by mouth daily.    [provider]  carbidopa-levodopa (SINEMET IR) 25-100 MG tablet Take 1 tablet by mouth 3 (three) times daily.  03/08/17   [provider]  carvedilol (COREG) 3.125 MG tablet Take 1 tablet (3.125 mg total) by mouth 2 (two) times daily with a meal. Patient taking differently: Take 3.125 mg by mouth daily.  04/18/17   Gladstone Lighter, MD  docusate sodium (COLACE) 100 MG capsule Take 100 mg by mouth 2 (two) times daily.    [provider]  Elastic Bandages & Supports (MEDICAL COMPRESSION STOCKINGS) MISC Please provide 66mmHg compression stockings 10/11/18    Harvest Dark, MD  furosemide (LASIX) 40 MG tablet Take 40 mg by mouth daily.  05/29/17   [provider]  gabapentin (NEURONTIN) 300 MG capsule Take 300-600 mg by mouth See admin instructions. Take 300 mg by mouth and 600 mg at bedtime 04/24/17   [provider]  glimepiride (AMARYL) 1 MG tablet Take 1 tablet (1 mg total) by mouth daily with breakfast. 04/19/17   Gladstone Lighter, MD  hydrOXYzine (ATARAX/VISTARIL) 25 MG tablet Take 25 mg by mouth daily.    [provider]  levothyroxine (SYNTHROID, LEVOTHROID) 137 MCG tablet  02/14/18   [provider]  Linaclotide (LINZESS) 145 MCG CAPS capsule Take 145 mcg by mouth daily.    [provider]  loratadine (CLARITIN) 10 MG tablet Take 10 mg by mouth daily.    [provider]  meclizine (ANTIVERT) 12.5 MG tablet  02/19/18   [provider]  metoprolol succinate (TOPROL-XL) 25 MG 24 hr tablet Take 25 mg by mouth daily. 04/26/18   [provider]  mupirocin ointment (BACTROBAN) 2 %  01/16/18   [provider]  nitroGLYCERIN (NITROSTAT) 0.4  MG SL tablet Place 0.4 mg under the tongue every 5 (five) minutes as needed for chest pain.  10/03/16   [provider]  omeprazole (PRILOSEC) 20 MG capsule Take 20 mg by mouth 2 (two) times daily before a meal.     [provider]  ondansetron (ZOFRAN-ODT) 4 MG disintegrating tablet  02/02/18   [provider]  potassium chloride SA (K-DUR,KLOR-CON) 20 MEQ tablet Take 20 mEq by mouth 2 (two) times daily. Pt takes once  day 06/29/17   [provider]  rosuvastatin (CRESTOR) 20 MG tablet Take 20 mg by mouth daily.    [provider]  sertraline (ZOLOFT) 100 MG tablet Take 50-100 mg by mouth See admin instructions. Take 100 mg in the am and 50 mg at bedtime.    [provider]  sucralfate (CARAFATE) 1 g tablet Take 1 g by mouth 3 (three) times daily. Dissolve one tablet in one tablespoon  of water to create slurry and take by mouth three times daily **hold Vitamin D while taking** 10/09/18   [provider]  sulfamethoxazole-trimethoprim (BACTRIM DS) 800-160 MG tablet Take 1 tablet by mouth 2 (two) times daily. 10/05/18   Earlie Server, MD     Allergies Penicillins and Penicillin g   Family History  Problem Relation Age of Onset  . Cancer Mother   . Depression Mother   . Diabetes Mother   . Hypertension Mother   . Aneurysm Mother   . Heart disease Father   . Diabetes Sister   . Diabetes Brother   . Diabetes Brother     Social History Social History   Tobacco Use  . Smoking status: Never Smoker  . Smokeless tobacco: Never Used  Substance Use Topics  . Alcohol use: No  . Drug use: No    Review of Systems  Constitutional:   No fever or chills.  ENT:   No sore throat. No rhinorrhea. Cardiovascular:   Positive chest pain as above without syncope. Respiratory:   No dyspnea or cough. Gastrointestinal:   Negative for abdominal pain, vomiting and diarrhea.  Musculoskeletal: Chronic lower leg swelling All other systems reviewed and are negative except as documented above in ROS and HPI.  ____________________________________________   PHYSICAL EXAM:  VITAL SIGNS: ED Triage Vitals [11/20/18 1427]  Enc Vitals Group     BP      Pulse      Resp      Temp      Temp src      SpO2      Weight 178 lb (80.7 kg)     Height 5\' 6"  (1.676 m)     Head Circumference      Peak Flow      Pain Score 0     Pain Loc      Pain Edu?      Excl. in [place]?     Vital signs reviewed, nursing assessments reviewed.   Constitutional:   Alert and oriented. Non-toxic appearance. Eyes:   Conjunctivae are normal. EOMI. PERRL. ENT      Head:   Normocephalic and atraumatic.      Nose:   No congestion/rhinnorhea.       Mouth/Throat:   MMM, no pharyngeal erythema. No peritonsillar mass.       Neck:   No meningismus. Full ROM. Hematological/Lymphatic/Immunilogical:   No  cervical lymphadenopathy. Cardiovascular:   RRR. Symmetric bilateral radial and DP pulses.  No murmurs. Cap refill less than 2  seconds. Respiratory:   Normal respiratory effort without tachypnea/retractions. Breath sounds are clear and equal bilaterally. No wheezes/rales/rhonchi. Gastrointestinal:   Soft and nontender. Non distended. There is no CVA tenderness.  No rebound, rigidity, or guarding.  Musculoskeletal:   Normal range of motion in all extremities. No joint effusions.  No lower extremity tenderness.  1+ pitting edema bilateral lower extremities. Neurologic:   Normal speech and language.  Motor grossly intact. No acute focal neurologic deficits are appreciated.  Skin:    Skin is warm, dry and intact. No rash noted.  No petechiae, purpura, or bullae.  ____________________________________________    LABS (pertinent positives/negatives) (all labs ordered are listed, but only abnormal results are displayed) Labs Reviewed  BASIC METABOLIC PANEL - Abnormal; Notable for the following components:      Result Value   Potassium 3.4 (*)    Glucose, Bld 198 (*)    Creatinine, Ser 1.01 (*)    GFR calc non Af Amer 53 (*)    All other components within normal limits  CBC - Abnormal; Notable for the following components:   Hemoglobin 11.8 (*)    All other components within normal limits  SARS CORONAVIRUS 2 (HOSPITAL ORDER, [place] [facility])  TROPONIN I (HIGH SENSITIVITY)  TROPONIN I (HIGH SENSITIVITY)   ____________________________________________   EKG  Interpreted by me Ventricular paced rhythm, rate of 68.  Left axis, right bundle branch block.  No acute ischemic changes.  ____________________________________________    [id number]  Dg Chest 2 View  Result Date: 11/20/2018 CLINICAL DATA:  Chest pain. EXAM: CHEST - 2 VIEW COMPARISON:  10/11/2018 FINDINGS: Midline trachea. Mild cardiomegaly. Prior median sternotomy. Trace left pleural fluid or thickening,  similar. Minimal biapical pleuroparenchymal scarring. No congestive failure. No lobar consolidation. Radiopaque foreign objects project over the left side of the chest. IMPRESSION: Cardiomegaly with trace left pleural fluid or thickening. No superimposed acute disease or evidence of congestive failure. Electronically Signed   By: Abigail Miyamoto M.D.   On: 11/20/2018 15:15    ____________________________________________   PROCEDURES Procedures  ____________________________________________  DIFFERENTIAL DIAGNOSIS   Stable angina, non-STEMI, GERD, anxiety  CLINICAL IMPRESSION / ASSESSMENT AND PLAN / ED COURSE  Medications ordered in the ED: Medications  sodium chloride flush (NS) 0.9 % injection 3 mL (has no administration in time range)  aspirin chewable tablet 324 mg (has no administration in time range)    Pertinent labs & imaging results that were available during my care of the patient were reviewed by me and considered in my medical decision making (see chart for details).  Bernadean Parham Bailly was evaluated in Emergency Department on 11/20/2018 for the symptoms described in the history of present illness. She was evaluated in the context of the global COVID-19 pandemic, which necessitated consideration that the patient might be at risk for infection with the SARS-CoV-2 virus that causes COVID-19. Institutional protocols and algorithms that pertain to the evaluation of patients at risk for COVID-19 are in a state of rapid change based on information released by regulatory bodies including the CDC and federal and state organizations. These policies and algorithms were followed during the patient's care in the ED.   Patient presents with 15-minute episode of central chest pain radiating to left arm associated with diaphoresis shortness of breath and nausea.  Initial troponin and EKG nonischemic.  Engaged in shared decision making with the patient and she would prefer hospitalization for further  telemetry monitoring and cardiac work-up which I think  is reasonable.  Case discussed with the hospitalist.      ____________________________________________   FINAL CLINICAL IMPRESSION(S) / ED DIAGNOSES    Final diagnoses:  Chest pain with moderate risk for cardiac etiology     ED Discharge Orders    None      Portions of this note were generated with dragon dictation software. Dictation errors may occur despite best attempts at proofreading.   Carrie Mew, MD 11/20/18 [phone]

## 2018-11-20 NOTE — Progress Notes (Signed)
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
## 2018-11-20 NOTE — Progress Notes (Signed)
Family Meeting Note  Advance Directive:yes  Today a meeting took place with the Patient.   The following clinical team members were present during this meeting:MD  The following were discussed:Patient's diagnosis: History of CAD, gastroesophageal reflux disease, hypertension, Patient's progosis: Unable to determine and Goals for treatment: DNR  Additional follow-up to be provided: cardiology  Time spent during discussion:20 minutes  Vaughan Basta, MD

## 2018-11-20 NOTE — H&P (Signed)
[facility] [place] NAME: Sonni Vanhoosier    MR#:  [id number]  DATE OF BIRTH:  1940-07-18  DATE OF ADMISSION:  11/20/2018  PRIMARY CARE PHYSICIAN: Lorelee Market, MD   REQUESTING/REFERRING PHYSICIAN: Joni Fears  CHIEF COMPLAINT:   Chief Complaint  Patient presents with  . Chest Pain    HISTORY OF PRESENT ILLNESS: Remmi Langerman  is a 78 y.o. female with a known history of anxiety, arthritis, coronary artery disease and MI, uterine cancer, depression, diabetes, gastroesophageal reflux disease, Parkinson's disease, spinal stenosis-has chronic GI complaints including epigastric pain, bloating, indigestion and she follows regularly with GI.  Today she went to GI clinic for her some complaint of 1 or 2 episodes of blood as she had rectal cancer and radiation history.  They have planned to do sigmoidoscopy next week as outpatient.  After coming home she had chest tightness which was radiating to her left arm and is this was not anything similar to her gastric reflux symptoms.  She was concerned as she has history of coronary artery disease so she took nitroglycerin tablet it did not help the pain and after 5 minutes she took another tablet but the pain continued so she called ambulance to come to the hospital.  By the time she comes to ER with EMS her pain subsided and she was feeling fine. Her first troponin was negative.  Her labs are without any major abnormalities. Patient was concerned as her pain was not typical of her GI reflexes.  And she requested ER physician to keep her here in the hospital.  PAST MEDICAL HISTORY:   Past Medical History:  Diagnosis Date  . Anxiety   . Arthritis   . Broken arm    left FA 3/17  . Broken arm    left  . CAD (coronary artery disease)    s/p MI  . Cancer ([place])    uterine  . Cervical disc disorder   . Depression   . Diabetes mellitus without complication ([place])   . GERD (gastroesophageal reflux disease)   .  Headache   . Hyperlipidemia   . Hypertension   . Hypothyroid   . Myocardial infarction ([place])    15 year ago  . Neck pain 06/04/2014  . Parkinson's disease ([place])   . Presence of permanent cardiac pacemaker   . Spinal stenosis     PAST SURGICAL HISTORY:  Past Surgical History:  Procedure Laterality Date  . ABDOMINAL HYSTERECTOMY     partial  . BREAST SURGERY    . COLONOSCOPY    . COLONOSCOPY WITH PROPOFOL N/A 11/29/2017   Procedure: COLONOSCOPY WITH PROPOFOL;  Surgeon: [name], MD;  Location[facility] ENDOSCOPY;  Service: Endoscopy;  Laterality: N/A;  . CORONARY ANGIOPLASTY     stent  . CORONARY ARTERY BYPASS GRAFT    . CORONARY STENT PLACEMENT    . ESOPHAGOGASTRODUODENOSCOPY (EGD) WITH PROPOFOL N/A 04/18/2017   Procedure: ESOPHAGOGASTRODUODENOSCOPY (EGD) WITH PROPOFOL;  Surgeon: [name], MD;  Location: [facility] ENDOSCOPY;  Service: Endoscopy;  Laterality: N/A;  . ESOPHAGOGASTRODUODENOSCOPY (EGD) WITH PROPOFOL N/A 11/29/2017   Procedure: ESOPHAGOGASTRODUODENOSCOPY (EGD) WITH PROPOFOL;  Surgeon: [name], MD;  Location: [facility] ENDOSCOPY;  Service: Endoscopy;  Laterality: N/A;  . INSERT / REPLACE / REMOVE PACEMAKER    . s/p pacer insertion    . TRANSANAL EXCISION OF RECTAL MASS N/A 01/17/2018   Procedure: TRANSANAL EXCISION OF RECTAL POLYP;  Surgeon: [name], MD;  Location:  [facility] ORS;  Service: General;  Laterality: N/A;    SOCIAL HISTORY:  Social History   Tobacco Use  . Smoking status: Never Smoker  . Smokeless tobacco: Never Used  Substance Use Topics  . Alcohol use: No    FAMILY HISTORY:  Family History  Problem Relation Age of Onset  . Cancer Mother   . Depression Mother   . Diabetes Mother   . Hypertension Mother   . Aneurysm Mother   . Heart disease Father   . Diabetes Sister   . Diabetes Brother   . Diabetes Brother     DRUG ALLERGIES:  Allergies  Allergen Reactions  . Penicillins Anaphylaxis  . Penicillin G Swelling and Rash     Has patient had a PCN reaction causing immediate rash, facial/tongue/throat swelling, SOB or lightheadedness with hypotension: Yes Has patient had a PCN reaction causing severe rash involving mucus membranes or skin necrosis: No Has patient had a PCN reaction that required hospitalization: No Has patient had a PCN reaction occurring within the last 10 years: No If all of the above answers are "NO", then may proceed with Cephalosporin use.     REVIEW OF SYSTEMS:   CONSTITUTIONAL: No fever, fatigue or weakness.  EYES: No blurred or double vision.  EARS, NOSE, AND THROAT: No tinnitus or ear pain.  RESPIRATORY: No cough, shortness of breath, wheezing or hemoptysis.  CARDIOVASCULAR: She had chest pain, no orthopnea, edema.  GASTROINTESTINAL: No nausea, vomiting, diarrhea or abdominal pain.  GENITOURINARY: No dysuria, hematuria.  ENDOCRINE: No polyuria, nocturia,  HEMATOLOGY: No anemia, easy bruising or bleeding SKIN: No rash or lesion. MUSCULOSKELETAL: No joint pain or arthritis.   NEUROLOGIC: No tingling, numbness, weakness.  PSYCHIATRY: No anxiety or depression.   MEDICATIONS AT HOME:  Prior to Admission medications   Medication Sig Start Date End Date Taking? Authorizing Provider  aspirin EC 81 MG tablet Take 81 mg by mouth daily.    [provider]  carbidopa-levodopa (SINEMET IR) 25-100 MG tablet Take 1 tablet by mouth 3 (three) times daily.  03/08/17   [provider]  carvedilol (COREG) 3.125 MG tablet Take 1 tablet (3.125 mg total) by mouth 2 (two) times daily with a meal. Patient taking differently: Take 3.125 mg by mouth daily.  04/18/17   Gladstone Lighter, MD  docusate sodium (COLACE) 100 MG capsule Take 100 mg by mouth 2 (two) times daily.    [provider]  Elastic Bandages & Supports (MEDICAL COMPRESSION STOCKINGS) MISC Please provide 15mmHg compression stockings 10/11/18   Harvest Dark, MD  furosemide (LASIX) 40 MG tablet Take 40 mg by  mouth daily.  05/29/17   [provider]  gabapentin (NEURONTIN) 300 MG capsule Take 300-600 mg by mouth See admin instructions. Take 300 mg by mouth and 600 mg at bedtime 04/24/17   [provider]  glimepiride (AMARYL) 1 MG tablet Take 1 tablet (1 mg total) by mouth daily with breakfast. 04/19/17   Gladstone Lighter, MD  hydrOXYzine (ATARAX/VISTARIL) 25 MG tablet Take 25 mg by mouth daily.    [provider]  levothyroxine (SYNTHROID, LEVOTHROID) 137 MCG tablet  02/14/18   [provider]  Linaclotide (LINZESS) 145 MCG CAPS capsule Take 145 mcg by mouth daily.    [provider]  loratadine (CLARITIN) 10 MG tablet Take 10 mg by mouth daily.    [provider]  meclizine (ANTIVERT) 12.5 MG tablet  02/19/18   [provider]  metoprolol succinate (TOPROL-XL) 25 MG  24 hr tablet Take 25 mg by mouth daily. 04/26/18   [provider]  mupirocin ointment (BACTROBAN) 2 %  01/16/18   [provider]  nitroGLYCERIN (NITROSTAT) 0.4 MG SL tablet Place 0.4 mg under the tongue every 5 (five) minutes as needed for chest pain.  10/03/16   [provider]  omeprazole (PRILOSEC) 20 MG capsule Take 20 mg by mouth 2 (two) times daily before a meal.     [provider]  ondansetron (ZOFRAN-ODT) 4 MG disintegrating tablet  02/02/18   [provider]  potassium chloride SA (K-DUR,KLOR-CON) 20 MEQ tablet Take 20 mEq by mouth 2 (two) times daily. Pt takes once  day 06/29/17   [provider]  rosuvastatin (CRESTOR) 20 MG tablet Take 20 mg by mouth daily.    [provider]  sertraline (ZOLOFT) 100 MG tablet Take 50-100 mg by mouth See admin instructions. Take 100 mg in the am and 50 mg at bedtime.    [provider]  sucralfate (CARAFATE) 1 g tablet Take 1 g by mouth 3 (three) times daily. Dissolve one tablet in one tablespoon of water to create slurry and take by mouth three times daily **hold  Vitamin D while taking** 10/09/18   [provider]  sulfamethoxazole-trimethoprim (BACTRIM DS) 800-160 MG tablet Take 1 tablet by mouth 2 (two) times daily. 10/05/18   Earlie Server, MD      PHYSICAL EXAMINATION:   VITAL SIGNS: Blood pressure 116/83, pulse 68, resp. rate 18, height 5\' 6"  (1.676 m), weight 80.7 kg, SpO2 (!) 87 %.  GENERAL:  78 y.o.-year-old patient lying in the bed with no acute distress.  EYES: Pupils equal, round, reactive to light and accommodation. No scleral icterus. Extraocular muscles intact.  HEENT: Head atraumatic, normocephalic. Oropharynx and nasopharynx clear.  NECK:  Supple, no jugular venous distention. No thyroid enlargement, no tenderness.  LUNGS: Normal breath sounds bilaterally, no wheezing, rales,rhonchi or crepitation. No use of accessory muscles of respiration.  CARDIOVASCULAR: S1, S2 normal. No murmurs, rubs, or gallops.  ABDOMEN: Soft, nontender, nondistended. Bowel sounds present. No organomegaly or mass.  EXTREMITIES: Right leg pedal edema, no cyanosis, or clubbing.  Elastic bandage wrapped on the right leg and foot. NEUROLOGIC: Cranial nerves II through XII are intact. Muscle strength 5/5 in all extremities. Sensation intact. Gait not checked.  PSYCHIATRIC: The patient is alert and oriented x 3.  SKIN: No obvious rash, lesion, or ulcer.   LABORATORY PANEL:   CBC Recent Labs  Lab 11/20/18 1432  WBC 5.2  HGB 11.8*  HCT 38.0  PLT 151  MCV 92.5  MCH 28.7  MCHC 31.1  RDW 14.7   ------------------------------------------------------------------------------------------------------------------  Chemistries  Recent Labs  Lab 11/20/18 1432  NA 141  K 3.4*  CL 102  CO2 29  GLUCOSE 198*  BUN 13  CREATININE 1.01*  CALCIUM 9.1   ------------------------------------------------------------------------------------------------------------------ estimated creatinine clearance is 47.6 mL/min (A) (by C-G formula based on SCr of 1.01 mg/dL  (H)). ------------------------------------------------------------------------------------------------------------------ No results for input(s): TSH, T4TOTAL, T3FREE, THYROIDAB in the last 72 hours.  Invalid input(s): FREET3   Coagulation profile No results for input(s): INR, PROTIME in the last 168 hours. ------------------------------------------------------------------------------------------------------------------- No results for input(s): DDIMER in the last 72 hours. -------------------------------------------------------------------------------------------------------------------  Cardiac Enzymes No results for input(s): CKMB, TROPONINI, MYOGLOBIN in the last 168 hours.  Invalid input(s): CK ------------------------------------------------------------------------------------------------------------------ Invalid input(s): POCBNP  ---------------------------------------------------------------------------------------------------------------  Urinalysis    Component Value Date/Time   COLORURINE STRAW (A) 04/27/2017 1840  APPEARANCEUR CLEAR (A) 04/27/2017 1840   APPEARANCEUR Clear 02/19/2014 2041   LABSPEC 1.006 04/27/2017 1840   LABSPEC 1.003 02/19/2014 2041   PHURINE 7.0 04/27/2017 1840   GLUCOSEU NEGATIVE 04/27/2017 1840   GLUCOSEU Negative 02/19/2014 2041   HGBUR NEGATIVE 04/27/2017 1840   BILIRUBINUR negative 01/25/2018 1209   BILIRUBINUR Negative 02/19/2014 2041   KETONESUR NEGATIVE 04/27/2017 1840   PROTEINUR Negative 01/25/2018 1209   PROTEINUR NEGATIVE 04/27/2017 1840   UROBILINOGEN 1.0 01/25/2018 1209   NITRITE negative 01/25/2018 1209   NITRITE NEGATIVE 04/27/2017 1840   LEUKOCYTESUR Negative 01/25/2018 1209   LEUKOCYTESUR Trace 02/19/2014 2041     RADIOLOGY: Dg Chest 2 View  Result Date: 11/20/2018 CLINICAL DATA:  Chest pain. EXAM: CHEST - 2 VIEW COMPARISON:  10/11/2018 FINDINGS: Midline trachea. Mild cardiomegaly. Prior median sternotomy. Trace left  pleural fluid or thickening, similar. Minimal biapical pleuroparenchymal scarring. No congestive failure. No lobar consolidation. Radiopaque foreign objects project over the left side of the chest. IMPRESSION: Cardiomegaly with trace left pleural fluid or thickening. No superimposed acute disease or evidence of congestive failure. Electronically Signed   By: Abigail Miyamoto M.D.   On: 11/20/2018 15:15    EKG: Orders placed or performed during the hospital encounter of 11/20/18  . ED EKG  . ED EKG  . EKG 12-Lead  . EKG 12-Lead    IMPRESSION AND PLAN:  *Chest pain Admit to telemetry and follow serial troponin. Echocardiogram and cardiology consult. Continue her baseline cardiac medications as she takes at home.  *Gastritis Patient was seen by GI clinic and she was on PPI and sucralfate, will continue the same. She was supposed to have sigmoidoscopy procedure for next week Her last endoscopy was done more than a year ago. I have texted Dr.Toledo, to let him know about this patient being admitted for chest pain rule out ACS symptoms.  I asked him if they would like to do any GI work-up while she is in the hospital if cardiac lesions are ruled out.  I am waiting for the reply but I have not called for official consult.  If there is any needs then please call GI consult tomorrow.  *Hypertension Home medications  *Hyper lipidemia Continue home medications.  *Hypothyroidism Continue levothyroxine.  *Hypokalemia  continue replacing overall.  All the records are reviewed and case discussed with ED provider. Management plans discussed with the patient, family and they are in agreement.  CODE STATUS: DNR Code Status History    Date Active Date Inactive Code Status Order ID Comments User Context   04/11/2017 0602 04/18/2017 1834 Full Code [id number]  Saundra Shelling, MD Inpatient   12/24/2016 0640 12/26/2016 2020 Full Code [id number]  Harrie Foreman, MD ED   11/17/2016 0450 11/17/2016 1954  Full Code [id number]  Saundra Shelling, MD Inpatient   09/29/2016 2002 09/30/2016 1605 Full Code [id number]  Epifanio Lesches, MD ED   Advance Care Planning Activity    Advance Directive Documentation     Most Recent Value  Type of Advance Directive  Healthcare Power of [place], Living will  Pre-existing out of facility DNR order (yellow form or pink MOST form)  -  "MOST" Form in Place?  -     Patient says her daughter is her power of attorney.  TOTAL TIME TAKING CARE OF THIS PATIENT: 45 minutes.    Vaughan Basta M.D on 11/20/2018   Between 7am to 6pm - Pager - [phone]  After 6pm go to [email or web address] - [employer]  [place]  [phone]  CC: Primary care physician; Lorelee Market, MD   Note: This dictation was prepared with Dragon dictation along with smaller phrase technology. Any transcriptional errors that result from this process are unintentional.

## 2018-11-20 NOTE — ED Triage Notes (Signed)
Pt comes into the ED via EMS from home with c/o left sided char pain that radiates into the left arm today, states she took 2 nitro SL at home does not have any pain at present. Pt states she was seen by her cardiologist a couple of weeks ago and told her HR was too fast and put her on medicine to slow it down. Hx of open heart surgery in the past

## 2018-11-21 ENCOUNTER — Observation Stay: Payer: Medicare Other

## 2018-11-21 ENCOUNTER — Observation Stay
Admit: 2018-11-21 | Discharge: 2018-11-21 | Disposition: A | Discharge status: Home / Self Care | Payer: Medicare Other | Attending: Internal Medicine | Admitting: Internal Medicine

## 2018-11-21 DIAGNOSIS — R079 Chest pain, unspecified: Secondary | ICD-10-CM | POA: Diagnosis not present

## 2018-11-21 LAB — CBC
HCT: 34.8 % — ABNORMAL LOW (ref 36.0–46.0)
Hemoglobin: 10.8 g/dL — ABNORMAL LOW (ref 12.0–15.0)
MCH: 28.6 pg (ref 26.0–34.0)
MCHC: 31 g/dL (ref 30.0–36.0)
MCV: 92.3 fL (ref 80.0–100.0)
Platelets: 139 10*3/uL — ABNORMAL LOW (ref 150–400)
RBC: 3.77 MIL/uL — ABNORMAL LOW (ref 3.87–5.11)
RDW: 14.6 % (ref 11.5–15.5)
WBC: 4.4 10*3/uL (ref 4.0–10.5)
nRBC: 0 % (ref 0.0–0.2)

## 2018-11-21 LAB — ECHOCARDIOGRAM COMPLETE
Height: 66 in
Weight: 2832 oz

## 2018-11-21 LAB — BASIC METABOLIC PANEL
Anion gap: 9 (ref 5–15)
BUN: 14 mg/dL (ref 8–23)
CO2: 29 mmol/L (ref 22–32)
Calcium: 8.9 mg/dL (ref 8.9–10.3)
Chloride: 105 mmol/L (ref 98–111)
Creatinine, Ser: 0.9 mg/dL (ref 0.44–1.00)
GFR calc Af Amer: >60 mL/min (ref >60)
GFR calc non Af Amer: >60 mL/min (ref >60)
Glucose, Bld: 112 mg/dL — ABNORMAL HIGH (ref 70–99)
Potassium: 3.3 mmol/L — ABNORMAL LOW (ref 3.5–5.1)
Sodium: 143 mmol/L (ref 135–145)

## 2018-11-21 MED ORDER — IOHEXOL 350 MG/ML SOLN
75.0000 mL | Freq: Once | INTRAVENOUS | Status: AC | PRN
  Administered 2018-11-21: 75 mL via INTRAVENOUS
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
## 2018-11-21 NOTE — Progress Notes (Signed)
Went over discharge instructions with the patient including medications and follow-up appointment. Patient discontinue her own PIV. Will discontinue tele monitor when ride gets here.
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
## 2018-11-21 NOTE — Discharge Instructions (Signed)
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
## 2018-11-21 NOTE — Progress Notes (Signed)
*  PRELIMINARY RESULTS* Echocardiogram 2D Echocardiogram has been performed.  Kristi [name] 11/21/2018, 11:51 AM

## 2018-11-22 ENCOUNTER — Encounter (INDEPENDENT_AMBULATORY_CARE_PROVIDER_SITE_OTHER): Payer: Self-pay | Admitting: Nurse Practitioner

## 2018-11-22 ENCOUNTER — Ambulatory Visit (INDEPENDENT_AMBULATORY_CARE_PROVIDER_SITE_OTHER): Payer: Medicare Other | Admitting: Nurse Practitioner

## 2018-11-22 ENCOUNTER — Other Ambulatory Visit: Payer: Self-pay

## 2018-11-22 VITALS — BP 144/80 | HR 87 | Resp 12 | Ht 66.0 in | Wt 176.0 lb

## 2018-11-22 DIAGNOSIS — I890 Lymphedema, not elsewhere classified: Secondary | ICD-10-CM

## 2018-11-22 NOTE — Progress Notes (Signed)
..  History of Present Illness  There is no documented history at this time  Assessments & Plan   There are no diagnoses linked to this encounter.    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    Procedure:  3 layer unna wrap was placed Right lower extremity.   Plan:   Follow up in one week.

## 2018-11-23 ENCOUNTER — Other Ambulatory Visit
Admission: RE | Admit: 2018-11-23 | Discharge: 2018-11-23 | Disposition: A | Discharge status: Home / Self Care | Payer: Medicare Other | Source: Ambulatory Visit | Attending: Student | Admitting: Student

## 2018-11-23 DIAGNOSIS — R197 Diarrhea, unspecified: Secondary | ICD-10-CM | POA: Insufficient documentation

## 2018-11-23 LAB — GASTROINTESTINAL PANEL BY PCR, STOOL (REPLACES STOOL CULTURE)

## 2018-11-23 LAB — C DIFFICILE QUICK SCREEN W PCR REFLEX
C Diff antigen: NEGATIVE
C Diff interpretation: NOT DETECTED
C Diff toxin: NEGATIVE

## 2018-11-27 ENCOUNTER — Encounter: Admission: RE | Payer: Self-pay | Source: Home / Self Care

## 2018-11-27 ENCOUNTER — Ambulatory Visit: Admission: RE | Admit: 2018-11-27 | Payer: Medicare Other | Source: Home / Self Care | Admitting: Gastroenterology

## 2018-11-27 SURGERY — SIGMOIDOSCOPY, FLEXIBLE
Anesthesia: General

## 2018-11-28 ENCOUNTER — Encounter: Payer: Self-pay | Admitting: Emergency Medicine

## 2018-11-28 ENCOUNTER — Emergency Department
Admission: EM | Admit: 2018-11-28 | Discharge: 2018-12-01 | Disposition: A | Discharge status: Other Acute Inpatient Hospital | Payer: Medicare Other | Attending: Emergency Medicine | Admitting: Emergency Medicine

## 2018-11-28 ENCOUNTER — Other Ambulatory Visit: Payer: Self-pay

## 2018-11-28 DIAGNOSIS — I10 Essential (primary) hypertension: Secondary | ICD-10-CM | POA: Insufficient documentation

## 2018-11-28 DIAGNOSIS — E039 Hypothyroidism, unspecified: Secondary | ICD-10-CM | POA: Insufficient documentation

## 2018-11-28 DIAGNOSIS — Z20828 Contact with and (suspected) exposure to other viral communicable diseases: Secondary | ICD-10-CM | POA: Diagnosis not present

## 2018-11-28 DIAGNOSIS — R45851 Suicidal ideations: Secondary | ICD-10-CM | POA: Diagnosis not present

## 2018-11-28 DIAGNOSIS — C211 Malignant neoplasm of anal canal: Secondary | ICD-10-CM | POA: Diagnosis present

## 2018-11-28 DIAGNOSIS — I2510 Atherosclerotic heart disease of native coronary artery without angina pectoris: Secondary | ICD-10-CM | POA: Diagnosis not present

## 2018-11-28 DIAGNOSIS — R937 Abnormal findings on diagnostic imaging of other parts of musculoskeletal system: Secondary | ICD-10-CM | POA: Diagnosis present

## 2018-11-28 DIAGNOSIS — F32A Depression, unspecified: Secondary | ICD-10-CM

## 2018-11-28 DIAGNOSIS — G8918 Other acute postprocedural pain: Secondary | ICD-10-CM | POA: Diagnosis present

## 2018-11-28 DIAGNOSIS — Z951 Presence of aortocoronary bypass graft: Secondary | ICD-10-CM | POA: Insufficient documentation

## 2018-11-28 DIAGNOSIS — E119 Type 2 diabetes mellitus without complications: Secondary | ICD-10-CM | POA: Insufficient documentation

## 2018-11-28 DIAGNOSIS — Z950 Presence of cardiac pacemaker: Secondary | ICD-10-CM | POA: Diagnosis present

## 2018-11-28 DIAGNOSIS — F332 Major depressive disorder, recurrent severe without psychotic features: Secondary | ICD-10-CM

## 2018-11-28 DIAGNOSIS — R140 Abdominal distension (gaseous): Secondary | ICD-10-CM | POA: Insufficient documentation

## 2018-11-28 DIAGNOSIS — G20 Parkinson's disease: Secondary | ICD-10-CM | POA: Diagnosis not present

## 2018-11-28 DIAGNOSIS — F329 Major depressive disorder, single episode, unspecified: Secondary | ICD-10-CM | POA: Diagnosis not present

## 2018-11-28 DIAGNOSIS — Z7982 Long term (current) use of aspirin: Secondary | ICD-10-CM | POA: Insufficient documentation

## 2018-11-28 DIAGNOSIS — F419 Anxiety disorder, unspecified: Secondary | ICD-10-CM | POA: Diagnosis present

## 2018-11-28 DIAGNOSIS — Z8542 Personal history of malignant neoplasm of other parts of uterus: Secondary | ICD-10-CM | POA: Diagnosis not present

## 2018-11-28 DIAGNOSIS — I639 Cerebral infarction, unspecified: Secondary | ICD-10-CM | POA: Diagnosis present

## 2018-11-28 LAB — CBC
HCT: 40.9 % (ref 36.0–46.0)
Hemoglobin: 12.8 g/dL (ref 12.0–15.0)
MCH: 29 pg (ref 26.0–34.0)
MCHC: 31.3 g/dL (ref 30.0–36.0)
MCV: 92.5 fL (ref 80.0–100.0)
Platelets: 182 10*3/uL (ref 150–400)
RBC: 4.42 MIL/uL (ref 3.87–5.11)
RDW: 14.6 % (ref 11.5–15.5)
WBC: 6.6 10*3/uL (ref 4.0–10.5)
nRBC: 0 % (ref 0.0–0.2)

## 2018-11-28 LAB — COMPREHENSIVE METABOLIC PANEL
ALT: 18 U/L (ref 0–44)
AST: 23 U/L (ref 15–41)
Albumin: 4.5 g/dL (ref 3.5–5.0)
Alkaline Phosphatase: 107 U/L (ref 38–126)
Anion gap: 12 (ref 5–15)
BUN: 20 mg/dL (ref 8–23)
CO2: 29 mmol/L (ref 22–32)
Calcium: 9.5 mg/dL (ref 8.9–10.3)
Chloride: 100 mmol/L (ref 98–111)
Creatinine, Ser: 0.94 mg/dL (ref 0.44–1.00)
GFR calc Af Amer: >60 mL/min (ref >60)
GFR calc non Af Amer: 57 mL/min — ABNORMAL LOW (ref >60)
Glucose, Bld: 157 mg/dL — ABNORMAL HIGH (ref 70–99)
Potassium: 3.8 mmol/L (ref 3.5–5.1)
Sodium: 141 mmol/L (ref 135–145)
Total Bilirubin: 0.4 mg/dL (ref 0.3–1.2)
Total Protein: 7.9 g/dL (ref 6.5–8.1)

## 2018-11-28 LAB — ETHANOL: Alcohol, Ethyl (B): <10 mg/dL (ref <10)

## 2018-11-28 LAB — ACETAMINOPHEN LEVEL: Acetaminophen (Tylenol), Serum: <10 ug/mL — ABNORMAL LOW (ref 10–30)

## 2018-11-28 LAB — SALICYLATE LEVEL: Salicylate Lvl: <7 mg/dL (ref 2.8–30.0)

## 2018-11-28 NOTE — ED Provider Notes (Signed)
[facility] Emergency Department Provider Note   ____________________________________________   First MD Initiated Contact with Patient 11/28/18 2155     (approximate)  I have reviewed the triage vital signs and the nursing notes.   HISTORY  Chief Complaint Psychiatric Evaluation    HPI Kristi [name] is a 78 y.o. female with past medical history see below presents to the ED complaining of suicidal ideation.  Patient reports that she has had trouble controlling her "nerves" for the past couple of months.  She states she has seen her PCP for this problem but they have not prescribed her any medication to help with her nerves.  She states that over the past couple of weeks she has begun having thoughts of harming herself.  She denies any specific plan, states she has not attempted to harm herself in the past.  She states he has been otherwise feeling well and denies any medical complaints.        Past Medical History:  Diagnosis Date  . Anxiety   . Arthritis   . Broken arm    left FA 3/17  . Broken arm    left  . CAD (coronary artery disease)    s/p MI  . Cancer ([place])    uterine  . Cervical disc disorder   . Depression   . Diabetes mellitus without complication ([place])   . GERD (gastroesophageal reflux disease)   . Headache   . Hyperlipidemia   . Hypertension   . Hypothyroid   . Myocardial infarction ([place])    15 year ago  . Neck pain 06/04/2014  . Parkinson's disease ([place])   . Presence of permanent cardiac pacemaker   . Spinal stenosis     Patient Active Problem List   Diagnosis Date Noted  . Chest pain 11/20/2018  . Insomnia 11/15/2018  . Type 2 diabetes mellitus ([place]) 11/15/2018  . Goals of care, counseling/discussion 02/06/2018  . 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 Rectal mass 01/05/2018  . Leg pain, bilateral 12/06/2017  . Lymphedema 07/10/2017  . Stricture and stenosis of esophagus   . Dysphagia   . Syncope and collapse  04/11/2017  . 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 Lumbar facet osteoarthritis (Bilateral) 01/17/2017  . Acute postoperative pain 01/17/2017  . Hypokalemia 12/24/2016  . Anxiety 12/07/2016  . 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 Cerebrovascular accident ([place]) 12/07/2016  . Chronic depression 12/07/2016  . Essential hypertension 12/07/2016  . Hypothyroidism 12/07/2016  . Mixed hyperlipidemia 12/07/2016  . Myocardial infarction ([place]) 12/07/2016  . TIA (transient ischemic attack) 11/17/2016  . Parkinson disease ([place]) 10/27/2016  . Tremor 10/27/2016  . Chronic knee pain (B) (R>L) 10/19/2016  . Ischemic chest pain ([place]) 09/29/2016  . Abnormal CT scan, lumbar spine 09/08/2016  . Lumbar foraminal stenosis (multilevel) 09/08/2016  . Fracture of clavicle 08/03/2016  . Polyneuropathy 05/17/2016  . Neurogenic pain 04/12/2016  . Chronic lower extremity pain (Bilateral) (R>L) 04/12/2016  . Chronic lower extremity radicular pain (Primary Source of Pain) (Bilateral) (R>L) 04/12/2016  . Lower extremity weakness 04/12/2016  . Chronic low back pain (Secondary source of pain) (Bilateral) (R>L) 04/12/2016  . Chronic wrist pain (Left) (secondary to fracture) 04/12/2016  . Lumbar spondylosis 04/12/2016  . Chronic pain syndrome 04/11/2016  . Long term current use of opiate analgesic 04/11/2016  . Long term prescription opiate use 04/11/2016  . Opiate use 04/11/2016  . Long term prescription benzodiazepine use 04/11/2016  . Lumbar radiculopathy (  Multilevel) (Bilateral) 05/07/2015  . Lumbar facet syndrome (Bilateral) (R>L) 08/21/2014  . 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 Sacroiliac joint dysfunction (Bilateral) 08/13/2014  . Frequent falls 06/04/2014  . Memory loss 06/04/2014  . Anxiety and depression 08/22/2013  . CAD (coronary artery disease) 08/22/2013  . Therapeutic opioid-induced constipation (OIC) 08/22/2013  . GERD (gastroesophageal reflux disease)  08/22/2013    Past Surgical History:  Procedure Laterality Date  . ABDOMINAL HYSTERECTOMY     partial  . BREAST SURGERY    . COLONOSCOPY    . COLONOSCOPY WITH PROPOFOL N/A 11/29/2017   Procedure: COLONOSCOPY WITH PROPOFOL;  Surgeon: [name], MD;  Location: [facility] ENDOSCOPY;  Service: Endoscopy;  Laterality: N/A;  . CORONARY ANGIOPLASTY     stent  . CORONARY ARTERY BYPASS GRAFT    . CORONARY STENT PLACEMENT    . ESOPHAGOGASTRODUODENOSCOPY (EGD) WITH PROPOFOL N/A 04/18/2017   Procedure: ESOPHAGOGASTRODUODENOSCOPY (EGD) WITH PROPOFOL;  Surgeon: [name], MD;  Location: [facility] ENDOSCOPY;  Service: Endoscopy;  Laterality: N/A;  . ESOPHAGOGASTRODUODENOSCOPY (EGD) WITH PROPOFOL N/A 11/29/2017   Procedure: ESOPHAGOGASTRODUODENOSCOPY (EGD) WITH PROPOFOL;  Surgeon: [name], MD;  Location: [facility] ENDOSCOPY;  Service: Endoscopy;  Laterality: N/A;  . INSERT / REPLACE / REMOVE PACEMAKER    . s/p pacer insertion    . TRANSANAL EXCISION OF RECTAL MASS N/A 01/17/2018   Procedure: TRANSANAL EXCISION OF RECTAL POLYP;  Surgeon: [name], MD;  Location: [facility] ORS;  Service: General;  Laterality: N/A;    Prior to Admission medications   Medication Sig Start Date End Date Taking? Authorizing Provider  aspirin EC 81 MG tablet Take 81 mg by mouth daily.    [provider]  carbidopa-levodopa (SINEMET IR) 25-100 MG tablet Take 1 tablet by mouth 3 (three) times daily.  03/08/17   [provider]  docusate sodium (COLACE) 100 MG capsule Take 100 mg by mouth 2 (two) times daily.    [provider]  Elastic Bandages & Supports (MEDICAL COMPRESSION STOCKINGS) MISC Please provide 16mmHg compression stockings 10/11/18   Harvest Dark, MD  furosemide (LASIX) 40 MG tablet Take 40 mg by mouth daily.  05/29/17   [provider]  gabapentin (NEURONTIN) 300 MG capsule Take 300-600 mg by mouth See admin instructions. Take 300 mg by mouth and 600 mg at bedtime  04/24/17   [provider]  glimepiride (AMARYL) 1 MG tablet Take 1 tablet (1 mg total) by mouth daily with breakfast. 04/19/17   Gladstone Lighter, MD  levocetirizine (XYZAL) 5 MG tablet Take 5 mg by mouth daily. 10/22/18   [provider]  levothyroxine (SYNTHROID, LEVOTHROID) 137 MCG tablet Take 137 mcg by mouth daily before breakfast. Take on an empty stomach with a glass of water at least 30-60 minutes before breakfast 02/14/18   [provider]  Linaclotide (LINZESS) 145 MCG CAPS capsule Take 145 mcg by mouth daily.    [provider]  loratadine (CLARITIN) 10 MG tablet Take 10 mg by mouth daily.    [provider]  meclizine (ANTIVERT) 12.5 MG tablet Take 12.5 mg by mouth 3 (three) times daily.  02/19/18   [provider]  metoprolol succinate (TOPROL-XL) 25 MG 24 hr tablet Take 25 mg by mouth daily. 04/26/18   [provider]  montelukast (SINGULAIR) 10 MG tablet Take 10 mg by mouth at bedtime. 10/22/18   [provider]  mupirocin ointment (BACTROBAN) 2 % Apply 1 application topically 2 (  two) times daily.  01/16/18   [provider]  nitroGLYCERIN (NITROSTAT) 0.4 MG SL tablet Place 0.4 mg under the tongue every 5 (five) minutes as needed for chest pain.  10/03/16   [provider]  omeprazole (PRILOSEC) 40 MG capsule Take 40 mg by mouth 2 (two) times daily. 10/22/18   [provider]  potassium chloride SA (K-DUR,KLOR-CON) 20 MEQ tablet Take 20 mEq by mouth 2 (two) times daily. Pt takes once  day 06/29/17   [provider]  rosuvastatin (CRESTOR) 40 MG tablet Take 40 mg by mouth daily. 10/22/18   [provider]  sertraline (ZOLOFT) 100 MG tablet Take 100 mg by mouth 2 (two) times daily. 11/19/18 11/19/19  [provider]  sucralfate (CARAFATE) 1 g tablet Take 1 g by mouth 3 (three) times daily. Dissolve one tablet in one tablespoon of water to create slurry and take by mouth three  times daily **hold Vitamin D while taking** 10/09/18   [provider]    Allergies Penicillins and Penicillin g  Family History  Problem Relation Age of Onset  . Cancer Mother   . Depression Mother   . Diabetes Mother   . Hypertension Mother   . Aneurysm Mother   . Heart disease Father   . Diabetes Sister   . Diabetes Brother   . Diabetes Brother     Social History Social History   Tobacco Use  . Smoking status: Never Smoker  . Smokeless tobacco: Never Used  Substance Use Topics  . Alcohol use: No  . Drug use: No    Review of Systems  Constitutional: No fever/chills Eyes: No visual changes. ENT: No sore throat. Cardiovascular: Denies chest pain. Respiratory: Denies shortness of breath. Gastrointestinal: No abdominal pain.  No nausea, no vomiting.  No diarrhea.  No constipation. Genitourinary: Negative for dysuria. Musculoskeletal: Negative for back pain. Skin: Negative for rash. Neurological: Negative for headaches, focal weakness or numbness.  Positive for suicidal ideation.  ____________________________________________   PHYSICAL EXAM:  VITAL SIGNS: ED Triage Vitals  Enc Vitals Group     BP 11/28/18 2113 (!) 153/73     Pulse Rate 11/28/18 2113 64     Resp 11/28/18 2113 18     Temp 11/28/18 2113 98.3 F (36.8 C)     Temp Source 11/28/18 2113 Oral     SpO2 11/28/18 2113 98 %     Weight --      Height --      Head Circumference --      Peak Flow --      Pain Score 11/28/18 2115 0     Pain Loc --      Pain Edu? --      Excl. in [place]? --     Constitutional: Alert and oriented. Eyes: Conjunctivae are normal. Head: Atraumatic. Nose: No congestion/rhinnorhea. Mouth/Throat: Mucous membranes are moist. Neck: Normal ROM Cardiovascular: Normal rate, regular rhythm. Grossly normal heart sounds. Respiratory: Normal respiratory effort.  No retractions. Lungs CTAB. Gastrointestinal: Soft and nontender. No distention. Genitourinary: deferred  Musculoskeletal: No lower extremity tenderness nor edema. Neurologic:  Normal speech and language. No gross focal neurologic deficits are appreciated. Skin:  Skin is warm, dry and intact. No rash noted. Psychiatric: Mood and affect are normal. Speech and behavior are normal.  ____________________________________________   LABS (all labs ordered are listed, but only abnormal results are displayed)  Labs Reviewed  COMPREHENSIVE METABOLIC PANEL - Abnormal; Notable for the following components:  Result Value   Glucose, Bld 157 (*)    GFR calc non Af Amer 57 (*)    All other components within normal limits  ACETAMINOPHEN LEVEL - Abnormal; Notable for the following components:   Acetaminophen (Tylenol), Serum <10 (*)    All other components within normal limits  SARS CORONAVIRUS 2 (HOSPITAL ORDER, [facility])  ETHANOL  SALICYLATE LEVEL  CBC  URINE DRUG SCREEN, QUALITATIVE ([facility] ONLY)  URINALYSIS, COMPLETE ([facility]) WITH MICROSCOPIC     PROCEDURES  Procedure(s) performed (including Critical Care):  Procedures   ____________________________________________   INITIAL IMPRESSION / ASSESSMENT AND PLAN / ED COURSE       78 year old female presenting to the ED with worsening depression over the past couple of months, now expressing suicidal ideation with no specific plan.  She denies any medical complaints at this time and screening labs are unremarkable, patient medically cleared.  Patient referred to the ED from [facility] and is currently under IVC.  She is pending further evaluation by behavioral health team.      ____________________________________________   FINAL CLINICAL IMPRESSION(S) / ED DIAGNOSES  Final diagnoses:  Depression, unspecified depression type  Suicidal ideation     ED Discharge Orders    None       Note:  This document was prepared using Dragon voice recognition software and may include unintentional dictation errors.    Blake Divine, MD 11/28/18 2328

## 2018-11-28 NOTE — ED Notes (Signed)
Psych NP in with patient at this time.

## 2018-11-28 NOTE — ED Notes (Signed)
Pt. Alert and oriented, warm and dry, in no distress. Pt. Denies SI, HI, and AVH. Pt states she was seen about 2 months ago with chest pain but states it was due to anxiety. Patient states doctors refuse to give her medications for anxiety and feels like she is having nervous break down. Pt. Encouraged to let nursing staff know of any concerns or needs.

## 2018-11-28 NOTE — ED Notes (Signed)
Pt dressed into burgundy scrubs.  Pts belongings, shoes, pants, bra, shirt, bag, is bagged, labelled, and placed at nurses station

## 2018-11-28 NOTE — ED Triage Notes (Signed)
Pt to ER via [facility] with IVC papers. PT endorses SI without plan.  Pt states she is depressed and "my nerves are shot, I'm having a breakdown."  Pt states she has reported to PCP that she needs something for her nerves, but he would not prescribe anything.  Pt reported to PCP office today that she feels she may really hurt herself.  Pt is pleasant and cooperative in triage.

## 2018-11-29 ENCOUNTER — Emergency Department: Payer: Medicare Other

## 2018-11-29 ENCOUNTER — Encounter (INDEPENDENT_AMBULATORY_CARE_PROVIDER_SITE_OTHER): Payer: Medicare Other

## 2018-11-29 ENCOUNTER — Ambulatory Visit: Admission: RE | Admit: 2018-11-29 | Payer: Medicare Other | Source: Ambulatory Visit

## 2018-11-29 ENCOUNTER — Encounter: Payer: Self-pay | Admitting: Radiology

## 2018-11-29 DIAGNOSIS — F329 Major depressive disorder, single episode, unspecified: Secondary | ICD-10-CM | POA: Diagnosis not present

## 2018-11-29 DIAGNOSIS — F419 Anxiety disorder, unspecified: Secondary | ICD-10-CM

## 2018-11-29 LAB — URINALYSIS, COMPLETE (UACMP) WITH MICROSCOPIC
Bilirubin Urine: NEGATIVE
Glucose, UA: NEGATIVE mg/dL
Ketones, ur: NEGATIVE mg/dL
Nitrite: NEGATIVE
Protein, ur: NEGATIVE mg/dL
Specific Gravity, Urine: 1.017 (ref 1.005–1.030)
pH: 5 (ref 5.0–8.0)

## 2018-11-29 LAB — URINE DRUG SCREEN, QUALITATIVE (ARMC ONLY)
Amphetamines, Ur Screen: NOT DETECTED
Barbiturates, Ur Screen: NOT DETECTED
Benzodiazepine, Ur Scrn: POSITIVE — AB
Cannabinoid 50 Ng, Ur ~~LOC~~: NOT DETECTED
Cocaine Metabolite,Ur ~~LOC~~: NOT DETECTED
MDMA (Ecstasy)Ur Screen: NOT DETECTED
Methadone Scn, Ur: NOT DETECTED
Opiate, Ur Screen: NOT DETECTED
Phencyclidine (PCP) Ur S: NOT DETECTED
Tricyclic, Ur Screen: NOT DETECTED

## 2018-11-29 LAB — SARS CORONAVIRUS 2 BY RT PCR (HOSPITAL ORDER, PERFORMED IN ~~LOC~~ HOSPITAL LAB): SARS Coronavirus 2: NEGATIVE

## 2018-11-29 MED ORDER — GABAPENTIN 300 MG PO CAPS
600.0000 mg | ORAL_CAPSULE | Freq: Every day | ORAL | Status: DC
  Administered 2018-11-29 – 2018-11-30 (×2): 600 mg via ORAL
  Filled 2018-11-29 (×2): qty 2

## 2018-11-29 MED ORDER — LEVOTHYROXINE SODIUM 137 MCG PO TABS
137.0000 ug | ORAL_TABLET | Freq: Every day | ORAL | Status: DC
  Administered 2018-11-29 – 2018-12-01 (×3): 137 ug via ORAL
  Filled 2018-11-29 (×3): qty 1

## 2018-11-29 MED ORDER — LEVOCETIRIZINE DIHYDROCHLORIDE 5 MG PO TABS: 5.0000 mg | ORAL_TABLET | Freq: Every day | ORAL | Status: DC

## 2018-11-29 MED ORDER — ASPIRIN EC 81 MG PO TBEC
81.0000 mg | DELAYED_RELEASE_TABLET | Freq: Every day | ORAL | Status: DC
  Administered 2018-11-29 – 2018-12-01 (×3): 81 mg via ORAL
  Filled 2018-11-29 (×3): qty 1

## 2018-11-29 MED ORDER — LINACLOTIDE 145 MCG PO CAPS
145.0000 ug | ORAL_CAPSULE | Freq: Every day | ORAL | Status: DC
  Administered 2018-11-29 – 2018-12-01 (×3): 145 ug via ORAL
  Filled 2018-11-29 (×3): qty 1

## 2018-11-29 MED ORDER — PANTOPRAZOLE SODIUM 40 MG PO TBEC
40.0000 mg | DELAYED_RELEASE_TABLET | Freq: Every day | ORAL | Status: DC
  Administered 2018-11-29 – 2018-12-01 (×3): 40 mg via ORAL
  Filled 2018-11-29 (×3): qty 1

## 2018-11-29 MED ORDER — GABAPENTIN 300 MG PO CAPS: 300.0000 mg | ORAL_CAPSULE | ORAL | Status: DC

## 2018-11-29 MED ORDER — IOHEXOL 9 MG/ML PO SOLN: 1000.0000 mL | Freq: Once | ORAL | Status: DC | PRN

## 2018-11-29 MED ORDER — SODIUM CHLORIDE 0.9 % IV BOLUS
1000.0000 mL | Freq: Once | INTRAVENOUS | Status: AC
  Administered 2018-11-29: 1000 mL via INTRAVENOUS

## 2018-11-29 MED ORDER — MECLIZINE HCL 25 MG PO TABS
12.5000 mg | ORAL_TABLET | Freq: Three times a day (TID) | ORAL | Status: DC
  Administered 2018-11-29 – 2018-11-30 (×4): 12.5 mg via ORAL
  Filled 2018-11-29 (×5): qty 1

## 2018-11-29 MED ORDER — DOCUSATE SODIUM 100 MG PO CAPS
100.0000 mg | ORAL_CAPSULE | Freq: Two times a day (BID) | ORAL | Status: DC
  Administered 2018-11-29 – 2018-12-01 (×3): 100 mg via ORAL
  Filled 2018-11-29 (×4): qty 1

## 2018-11-29 MED ORDER — LORATADINE 10 MG PO TABS
10.0000 mg | ORAL_TABLET | Freq: Every day | ORAL | Status: DC
  Administered 2018-11-29 – 2018-12-01 (×3): 10 mg via ORAL
  Filled 2018-11-29 (×3): qty 1

## 2018-11-29 MED ORDER — SERTRALINE HCL 50 MG PO TABS
100.0000 mg | ORAL_TABLET | Freq: Two times a day (BID) | ORAL | Status: DC
  Administered 2018-11-29 – 2018-12-01 (×5): 100 mg via ORAL
  Filled 2018-11-29 (×5): qty 2

## 2018-11-29 MED ORDER — GLIMEPIRIDE 1 MG PO TABS
1.0000 mg | ORAL_TABLET | Freq: Every day | ORAL | Status: DC
  Administered 2018-11-29 – 2018-12-01 (×3): 1 mg via ORAL
  Filled 2018-11-29 (×3): qty 1

## 2018-11-29 MED ORDER — GABAPENTIN 300 MG PO CAPS
300.0000 mg | ORAL_CAPSULE | Freq: Every morning | ORAL | Status: DC
  Administered 2018-11-29 – 2018-12-01 (×3): 300 mg via ORAL
  Filled 2018-11-29 (×3): qty 1

## 2018-11-29 MED ORDER — CARBIDOPA-LEVODOPA 25-100 MG PO TABS
1.0000 | ORAL_TABLET | Freq: Three times a day (TID) | ORAL | Status: DC
  Administered 2018-11-29: 1 via ORAL
  Filled 2018-11-29 (×3): qty 1

## 2018-11-29 MED ORDER — MONTELUKAST SODIUM 10 MG PO TABS
10.0000 mg | ORAL_TABLET | Freq: Every day | ORAL | Status: DC
  Administered 2018-11-29 – 2018-11-30 (×2): 10 mg via ORAL

## 2018-11-29 MED ORDER — IOHEXOL 300 MG/ML  SOLN
100.0000 mL | Freq: Once | INTRAMUSCULAR | Status: AC | PRN
  Administered 2018-11-29: 100 mL via INTRAVENOUS

## 2018-11-29 NOTE — BH Assessment (Signed)
Referral information for Psychiatric Hospitalization faxed to;   [name] [phone]- [phone]),     [facility] [phone] -[phone]) [phone]  . Mikel Cella [phone], [phone], [phone] or [phone]),   . [place] [phone] or [phone])  . [facility] [phone]),   . [facility] [phone] -or- [phone]),   . [address] [phone]),   . [facility] [phone] or [phone])  . [place] [phone] or [phone]),   . [facility] [phone])

## 2018-11-29 NOTE — ED Provider Notes (Signed)
5:54 PM Assumed care for off going team.   Blood pressure 126/75, pulse 67, temperature 98.3 F (36.8 C), temperature source Oral, resp. rate 16, SpO2 95 %.  See their HPI for full report but in brief patient waiting psychiatric evaluation however CT scan was ordered given patient was supposed have an outpatient.  CT scan shows sigmoid diverticulosis.  I updated patient on results.        Vanessa Sturgeon, MD 11/29/18 1754

## 2018-11-29 NOTE — ED Notes (Signed)
Urine collected. 70mL.

## 2018-11-29 NOTE — ED Notes (Signed)
Pt given breakfast tray and hand hygiene was encouraged.

## 2018-11-29 NOTE — ED Notes (Signed)
Pt given meal tray and hand hygiene encouraged.

## 2018-11-29 NOTE — Consult Note (Signed)
[facility] Face-to-Face Psychiatry Consult   Reason for Consult: Psychiatric evaluation Referring Physician: Dr. 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 female patient presented to [facility] ED via [facility] under involuntary commitment status (IVC). Per the ED triage nursing notes, the patient endorses suicidal ideation without a plan.  The patient states she has been depressed for a long time "my nerves are shot, and I am having a nervous breakdown."  During her assessment, she voiced she has reported the symptoms to her primary care provider (PCP), stating that she needs something to help with the nervous symptoms.  She declared, "my doctor refused to prescribe me medication that could help me."  The patient discussed that she went to her doctor's visit today (11/28/2018) and endorsed suicidal ideation.  The patient stated, "I told my doctor that I would hurt myself because he is not willing to help me." The patient was seen face-to-face by this provider; chart reviewed and consulted with Dr. Owens Shark on 11/28/2018 due to the patient's care. It was discussed with the EDP that the patient does meet the criteria to be admitted to the geriatric psychiatric inpatient unit once a bed becomes available.  The patient is alert and oriented x3, calm and cooperative, and mood-congruent with affect on evaluation. The patient does not appear to be responding to internal or external stimuli. Neither is the patient  presenting with any delusional thinking. The patient denies auditory or visual hallucinations. The patient admitted suicidal ideations without a plan, but voiced, "I need some help."  The patient denies homicidal ideations. The patient is not presenting with any psychotic or paranoid behaviors. During an encounter with the patient, she was able to answer questions appropriately.  Plan: The patient is a safety risk to self and does require psychiatric inpatient admission for stabilization and treatment.  HPI: Per Dr. Charna Archer; Kristi [name] is a 78 y.o. female with past medical history see below presents to the ED complaining of suicidal ideation.  Patient reports that she has had trouble controlling her "nerves" for the past couple of months.  She states she has seen her PCP for this problem but they have not prescribed her any medication to help with her nerves.  She states that over the past couple of weeks she has begun having thoughts of harming herself.  She denies any specific plan, states she has not attempted to harm herself in the past.  She states he has been otherwise feeling well and denies any medical complaints.  Past Psychiatric History:  Anxiety Depression Parkinson's disease ([place])  Risk to Self:  Yes Risk to Others:  No Prior Inpatient Therapy:  Yes Prior Outpatient Therapy:  Yes  Past Medical History:  Past Medical History:  Diagnosis Date  . Anxiety   . Arthritis   . Broken arm    left FA 3/17  . Broken arm    left  . CAD (coronary artery disease)    s/p  MI  . Cancer ([place])    uterine  . Cervical disc disorder   . Depression   . Diabetes mellitus without complication ([place])   . GERD (gastroesophageal reflux disease)   . Headache   . Hyperlipidemia   . Hypertension   . Hypothyroid   . Myocardial infarction ([place])    15 year ago  . Neck pain 06/04/2014  . Parkinson's disease ([place])   . Presence of permanent cardiac pacemaker   . Spinal stenosis     Past  Surgical History:  Procedure Laterality Date  . ABDOMINAL HYSTERECTOMY     partial  . BREAST SURGERY    . COLONOSCOPY    . COLONOSCOPY WITH PROPOFOL N/A 11/29/2017   Procedure: COLONOSCOPY WITH PROPOFOL;  Surgeon: [name], MD;  Location: [facility] ENDOSCOPY;  Service: Endoscopy;  Laterality: N/A;  . CORONARY ANGIOPLASTY     stent  . CORONARY ARTERY BYPASS GRAFT    . CORONARY STENT PLACEMENT    . ESOPHAGOGASTRODUODENOSCOPY (EGD) WITH PROPOFOL N/A 04/18/2017   Procedure: ESOPHAGOGASTRODUODENOSCOPY (EGD) WITH PROPOFOL;  Surgeon: [name], MD;  Location: [facility] ENDOSCOPY;  Service: Endoscopy;  Laterality: N/A;  . ESOPHAGOGASTRODUODENOSCOPY (EGD) WITH PROPOFOL N/A 11/29/2017   Procedure: ESOPHAGOGASTRODUODENOSCOPY (EGD) WITH PROPOFOL;  Surgeon: [name], MD;  Location: [facility] ENDOSCOPY;  Service: Endoscopy;  Laterality: N/A;  . INSERT / REPLACE / REMOVE PACEMAKER    . s/p pacer insertion    . TRANSANAL EXCISION OF RECTAL MASS N/A 01/17/2018   Procedure: TRANSANAL EXCISION OF RECTAL POLYP;  Surgeon: [name], MD;  Location: [facility] ORS;  Service: General;  Laterality: N/A;   Family History:  Family History  Problem Relation Age of Onset  . Cancer Mother   . Depression Mother   . Diabetes Mother   . Hypertension Mother   . Aneurysm Mother   . Heart disease Father   . Diabetes Sister   . Diabetes Brother   . Diabetes Brother    Family Psychiatric  History:  Social History:  Social History   Substance and Sexual Activity  Alcohol Use No     Social History   Substance and Sexual Activity  Drug Use No    Social History   Socioeconomic History  . Marital status: Widowed    Spouse name: Not on file  . Number of children: Not on file  . Years of education: Not on file  . Highest education level: Not on file  Occupational History  . Occupation: retired  Scientific laboratory technician  . Financial resource strain: Not on file  . Food insecurity    Worry: Not on file     Inability: Not on file  . Transportation needs    Medical: Not on file    Non-medical: Not on file  Tobacco Use  . Smoking status: Never Smoker  . Smokeless tobacco: Never Used  Substance and Sexual Activity  . Alcohol use: No  . Drug use: No  . Sexual activity: Never  Lifestyle  . Physical activity    Days per week: Not on file    Minutes per session: Not on file  . Stress: Not on file  Relationships  . Social Herbalist on phone: Not on file    Gets together: Not on file    Attends religious service: Not on file    Active member of club or organization: Not on file    Attends meetings of clubs or organizations: Not on file  Relationship status: Not on file  Other Topics Concern  . Not on file  Social History Narrative  . Not on file   Additional Social History:    Allergies:   Allergies  Allergen Reactions  . Penicillins Anaphylaxis  . Penicillin G Swelling and Rash    Has patient had a PCN reaction causing immediate rash, facial/tongue/throat swelling, SOB or lightheadedness with hypotension: Yes Has patient had a PCN reaction causing severe rash involving mucus membranes or skin necrosis: No Has patient had a PCN reaction that required hospitalization: No Has patient had a PCN reaction occurring within the last 10 years: No If all of the above answers are "NO", then may proceed with Cephalosporin use.     Labs:  Results for orders placed or performed during the hospital encounter of 11/28/18 (from the past 48 hour(s))  Comprehensive metabolic panel     Status: Abnormal   Collection Time: 11/28/18  9:29 PM  Result Value Ref Range   Sodium 141 135 - 145 mmol/L   Potassium 3.8 3.5 - 5.1 mmol/L   Chloride 100 98 - 111 mmol/L   CO2 29 22 - 32 mmol/L   Glucose, Bld 157 (H) 70 - 99 mg/dL   BUN 20 8 - 23 mg/dL   Creatinine, Ser 0.94 0.44 - 1.00 mg/dL   Calcium 9.5 8.9 - 10.3 mg/dL   Total Protein 7.9 6.5 - 8.1 g/dL   Albumin 4.5 3.5 - 5.0 g/dL   AST  23 15 - 41 U/L   ALT 18 0 - 44 U/L   Alkaline Phosphatase 107 38 - 126 U/L   Total Bilirubin 0.4 0.3 - 1.2 mg/dL   GFR calc non Af Amer 57 (L) >60 mL/min   GFR calc Af Amer >60 >60 mL/min   Anion gap 12 5 - 15    Comment: Performed at [facility], [address], [place], [place] [address]  Ethanol     Status: None   Collection Time: 11/28/18  9:29 PM  Result Value Ref Range   Alcohol, Ethyl (B) <10 <10 mg/dL    Comment: (NOTE) Lowest detectable limit for serum alcohol is 10 mg/dL. For medical purposes only. Performed at [facility], [address] [place], [place] [id number]   Salicylate level     Status: None   Collection Time: 11/28/18  9:29 PM  Result Value Ref Range   Salicylate Lvl [id number] 2.8 - 30.0 mg/dL    Comment: Performed at [facility], [address] [place], [place] [address]  Acetaminophen level     Status: Abnormal   Collection Time: 11/28/18  9:29 PM  Result Value Ref Range   Acetaminophen (Tylenol), Serum <10 (L) 10 - 30 ug/mL    Comment: (NOTE) Therapeutic concentrations vary significantly. A range of 10-30 ug/mL  may be an effective concentration for many patients. However, some  are best treated at concentrations outside of this range. Acetaminophen concentrations >150 ug/mL at 4 hours after ingestion  and >50 ug/mL at 12 hours after ingestion are often associated with  toxic reactions. Performed at [facility], [address] [place], [place] [address]   cbc     Status: None   Collection Time: 11/28/18  9:29 PM  Result Value Ref Range   WBC 6.6 4.0 - 10.5 K/uL   RBC 4.42 3.87 - 5.11 MIL/uL   Hemoglobin 12.8 12.0 - 15.0 g/dL   HCT 40.9 36.0 - 46.0 %   MCV  92.5 80.0 - 100.0 fL   MCH 29.0 26.0 - 34.0 pg   MCHC 31.3 30.0 - 36.0 g/dL   RDW 14.6 11.5 - 15.5 %   Platelets 182 150 - 400 K/uL   nRBC 0.0 0.0 - 0.2 %    Comment: Performed at [facility], [address] [place], [place] [address]     No current facility-administered medications for this encounter.    Current Outpatient Medications  Medication Sig Dispense Refill  . aspirin EC 81 MG tablet Take 81 mg by mouth daily.    . carbidopa-levodopa (SINEMET IR) 25-100 MG tablet Take 1 tablet by mouth 3 (three) times daily.     [name] docusate sodium (COLACE) 100 MG capsule Take 100 mg by mouth 2 (two) times daily.    Water engineer Bandages & Supports (MEDICAL COMPRESSION STOCKINGS) MISC Please provide 21mmHg compression stockings 1 each 0  . furosemide (LASIX) 40 MG tablet Take 40 mg by mouth daily.     [name] gabapentin (NEURONTIN) 300 MG capsule Take 300-600 mg by mouth See admin instructions. Take 300 mg by mouth and 600 mg at bedtime    . glimepiride (AMARYL) 1 MG tablet Take 1 tablet (1 mg total) by mouth daily with breakfast. 30 tablet 2  . levocetirizine (XYZAL) 5 MG tablet Take 5 mg by mouth daily.    [name] levothyroxine (SYNTHROID, LEVOTHROID) 137 MCG tablet Take 137 mcg by mouth daily before breakfast. Take on an empty stomach with a glass of water at least 30-60 minutes before breakfast    . Linaclotide (LINZESS) 145 MCG CAPS capsule Take 145 mcg by mouth daily.    [name] loratadine (CLARITIN) 10 MG tablet Take 10 mg by mouth daily.    . meclizine (ANTIVERT) 12.5 MG tablet Take 12.5 mg by mouth 3 (three) times daily.     . metoprolol succinate (TOPROL-XL) 25 MG 24 hr tablet Take 25 mg by mouth daily.    . montelukast (SINGULAIR) 10 MG tablet Take 10 mg by mouth at bedtime.    . mupirocin ointment (BACTROBAN) 2 % Apply 1 application topically 2 (two) times daily.     . nitroGLYCERIN (NITROSTAT) 0.4 MG SL tablet Place 0.4 mg under the tongue every 5 (five) minutes as needed for chest pain.     [name] omeprazole (PRILOSEC) 40 MG capsule Take 40 mg by mouth 2 (two) times daily.    . potassium chloride SA (K-DUR,KLOR-CON) 20 MEQ tablet Take 20 mEq by mouth 2 (two) times daily. Pt takes once  day    . rosuvastatin (CRESTOR) 40 MG tablet Take 40 mg by  mouth daily.    . sertraline (ZOLOFT) 100 MG tablet Take 100 mg by mouth 2 (two) times daily.    . sucralfate (CARAFATE) 1 g tablet Take 1 g by mouth 3 (three) times daily. Dissolve one tablet in one tablespoon of water to create slurry and take by mouth three times daily **hold Vitamin D while taking**      Musculoskeletal: Strength & Muscle Tone: within normal limits Gait & Station: normal Patient leans: N/A  Psychiatric Specialty Exam: Physical Exam  Nursing note and vitals reviewed. Constitutional: She is oriented to person, place, and time. She appears well-developed.  HENT:  Head: Normocephalic.  Eyes: Pupils are equal, round, and reactive to light.  Neck: Normal range of motion. Neck supple.  Cardiovascular: Normal rate.  Respiratory: Effort normal.  Musculoskeletal: Normal range of motion.  Neurological: She is alert and oriented  to person, place, and time.  Skin: Skin is warm and dry.  Psychiatric: Thought content normal.    Review of Systems  Psychiatric/Behavioral: Positive for depression and suicidal ideas. The patient is nervous/anxious.   All other systems reviewed and are negative.   Blood pressure (!) 153/73, pulse 64, temperature 98.3 F (36.8 C), temperature source Oral, resp. rate 18, SpO2 98 %.There is no height or weight on file to calculate BMI.  General Appearance: Casual  Eye Contact:  Good  Speech:  Clear and Coherent  Volume:  Normal  Mood:  Anxious and Depressed  Affect:  Congruent and Depressed  Thought Process:  Coherent  Orientation:  Full (Time, Place, and Person)  Thought Content:  WDL and Logical  Suicidal Thoughts:  Yes.  without intent/plan  Homicidal Thoughts:  No  Memory:  Immediate;   Good Recent;   Good Remote;   Good  Judgement:  Poor  Insight:  Lacking  Psychomotor Activity:  Decreased  Concentration:  Concentration: Fair and Attention Span: Fair  Recall:  Good  Fund of Knowledge:  Good  Language:  Good  Akathisia:  Negative   Handed:  Right  AIMS (if indicated):     Assets:  Communication Skills Desire for Improvement Social Support  ADL's:  Intact  Cognition:  WNL  Sleep:   Good     Treatment Plan Summary: Medication management and Plan Patient meets criteria for geriatric psychiatric inpatient admission once a bed becomes available.  Disposition: Recommend psychiatric Inpatient admission when medically cleared. Supportive therapy provided about ongoing stressors.  Caroline Sauger, NP 11/29/2018 4:03 AM

## 2018-11-29 NOTE — ED Provider Notes (Signed)
-----------------------------------------   7:12 AM on 11/29/2018 -----------------------------------------   Blood pressure (!) 153/73, pulse 64, temperature 98.3 F (36.8 C), temperature source Oral, resp. rate 18, SpO2 98 %.  The patient is calm and cooperative at this time.  There have been no acute events since the last update.  Awaiting disposition plan from Behavioral Medicine and/or Social Work team(s).    Nena Polio, MD 11/29/18 [phone]

## 2018-11-29 NOTE — ED Notes (Signed)
Patient assisted patient to restroom. Patient was then walked around unit to keep mobility with walker. This Armed forces technical officer was with patient during walk.

## 2018-11-29 NOTE — ED Provider Notes (Signed)
Patient has been here for multiple hours has not urinated into her depends which she came with is still dry she is not provided this with the urine specimen we will give her some fluid to make sure she is staying hydrated and collect a urine specimen.   Kristi Polio, MD 11/29/18 [phone]

## 2018-11-30 ENCOUNTER — Ambulatory Visit: Payer: Medicare Other | Admitting: Surgery

## 2018-11-30 ENCOUNTER — Other Ambulatory Visit: Payer: Self-pay

## 2018-11-30 DIAGNOSIS — F329 Major depressive disorder, single episode, unspecified: Secondary | ICD-10-CM | POA: Diagnosis not present

## 2018-11-30 DIAGNOSIS — F419 Anxiety disorder, unspecified: Secondary | ICD-10-CM | POA: Diagnosis not present

## 2018-11-30 MED ORDER — HYDROXYZINE HCL 10 MG PO TABS
10.0000 mg | ORAL_TABLET | Freq: Three times a day (TID) | ORAL | Status: DC
  Administered 2018-11-30 – 2018-12-01 (×4): 10 mg via ORAL
  Filled 2018-11-30 (×6): qty 1

## 2018-11-30 NOTE — ED Notes (Signed)
Pt. Alert and oriented, warm and dry, in no distress. Pt. Denies SI, HI, and AVH. Pt. Encouraged to let nursing staff know of any concerns or needs. 
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
## 2018-11-30 NOTE — ED Notes (Signed)

## 2018-11-30 NOTE — ED Notes (Signed)
BEHAVIORAL HEALTH ROUNDING Patient sleeping: Yes.   Patient alert and oriented: Sleeping Behavior appropriate: Yes.  ; If no, describe:  Nutrition and fluids offered: Yes  Toileting and hygiene offered: Yes  Sitter present: yes Law enforcement present: Yes

## 2018-11-30 NOTE — ED Notes (Addendum)
Patient up to restroom with use of walker. Patient ambulated with no issues.

## 2018-11-30 NOTE — ED Notes (Signed)
IVC/ Pending Gerio-psych placement

## 2018-11-30 NOTE — ED Provider Notes (Signed)
-----------------------------------------   5:33 AM on 11/30/2018 -----------------------------------------   BP (!) 108/56 (BP Location: Right Arm)   Pulse 67   Temp 98.6 F (37 C) (Oral)   Resp 16   SpO2 97%   No acute events since last update.  Disposition is pending per Psychiatry/Behavioral Medicine team recommendations.     Nance Pear, MD 11/30/18 [phone]

## 2018-11-30 NOTE — ED Notes (Signed)
Pt c/o chest tightness 8/10 with shortness of breath that started yesterday. Pt not in obvious distress and was asleep when checked on at 0800. Pt placed on monitor, BP 126/72, hr 61 v-paced, resp 16, o2 sats 98% on RA. Dr Myrene Buddy notified. EKG done and given to EDP.

## 2018-12-01 DIAGNOSIS — F332 Major depressive disorder, recurrent severe without psychotic features: Secondary | ICD-10-CM | POA: Diagnosis present

## 2018-12-01 DIAGNOSIS — F329 Major depressive disorder, single episode, unspecified: Secondary | ICD-10-CM | POA: Diagnosis not present

## 2018-12-01 MED ORDER — ACETAMINOPHEN 325 MG PO TABS
650.0000 mg | ORAL_TABLET | Freq: Once | ORAL | Status: AC
  Administered 2018-12-01: 650 mg via ORAL
  Filled 2018-12-01: qty 2

## 2018-12-01 NOTE — ED Provider Notes (Signed)
-----------------------------------------   6:21 AM on 12/01/2018 -----------------------------------------   Blood pressure 119/72, pulse 61, temperature 98.6 F (37 C), temperature source Oral, resp. rate 20, SpO2 98 %.  The patient is calm and cooperative at this time.  There have been no acute events since the last update.  Awaiting disposition plan from Behavioral Medicine and/or Social Work team(s).   Paulette Blanch, MD 12/01/18 [phone]
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
## 2018-12-01 NOTE — BH Assessment (Signed)
Patient has been accepted to [facility].  Patient assigned to [facility] Accepting physician is Dr. Jonelle Sports.  Call report to [phone].  Representative was Pax.   ER Staff is aware of it:  Edd Arbour, ER Secretary  Dr. Kerman Passey, ER MD  Abigail Butts, Patient's Nurse   *Patient can arrive to accepting facility at 10:00am - 12/01/2018

## 2018-12-01 NOTE — ED Notes (Signed)
Patient voiced understanding of transfer to [facility], Patient's belongings sent with her, no signs of distress, family notified.

## 2018-12-01 NOTE — ED Notes (Signed)
Pt ambulatory to toilet with walker att

## 2018-12-01 NOTE — ED Notes (Signed)
Nurse called daughter per Patient's request to let her know that she was being transferred to [facility] for treatment.

## 2018-12-01 NOTE — ED Notes (Signed)
emtala reviewed by this RN 

## 2018-12-01 NOTE — ED Notes (Signed)
Nurse called report to [facility], reported to [facility] RN, awaiting [facility] department to call and give time that she can be transported.

## 2018-12-01 NOTE — ED Notes (Signed)
pt sleeping soundly; even and unlabored respirations, will continue to monitor; walker at bedside

## 2018-12-03 ENCOUNTER — Telehealth: Payer: Self-pay

## 2018-12-03 DIAGNOSIS — C211 Malignant neoplasm of anal canal: Secondary | ICD-10-CM

## 2018-12-03 NOTE — Telephone Encounter (Signed)
Left message for patient to call office. Dr.Byrnett no longer here, no one does anal biopsies here so patient referred to Colorectal specialist Leighton Ruff.   Tried reaching patient's daughter to let her know the above information and her mailbox was full and unable to leave message at this time.    Referral sent to Leighton Ruff - colorectal specialist- [facility]. Faxed referral, office notes, demograhics to [phone]

## 2018-12-05 NOTE — Telephone Encounter (Signed)
Left message with Freda Munro at [facility] regarding referral that was faxed on 12/03/18 @13 :57 to see they have contacted the patient and if so when is the appointment date/time.

## 2018-12-06 ENCOUNTER — Ambulatory Visit (INDEPENDENT_AMBULATORY_CARE_PROVIDER_SITE_OTHER): Payer: Medicare Other | Admitting: Nurse Practitioner

## 2018-12-13 NOTE — Telephone Encounter (Signed)
Spoke with daughter Clarene Critchley and provided DR.Leighton Ruff office number and let her know she could move forward with making the appointment since they have received the referral.      Hoyle Sauer spoke with Judson Roch on 12/06/2018 and Judson Roch said they would call the patient when discharged from hospital.   Spoke with Patient at this time and she is requesting to see Dr.Byrnett. Address and phone number provided to the patient at this time.

## 2018-12-24 ENCOUNTER — Ambulatory Visit (INDEPENDENT_AMBULATORY_CARE_PROVIDER_SITE_OTHER): Payer: Medicare Other | Admitting: Nurse Practitioner

## 2018-12-27 ENCOUNTER — Ambulatory Visit (INDEPENDENT_AMBULATORY_CARE_PROVIDER_SITE_OTHER): Payer: Medicare Other | Admitting: Nurse Practitioner

## 2019-01-01 ENCOUNTER — Other Ambulatory Visit: Payer: Self-pay | Admitting: General Surgery

## 2019-01-01 DIAGNOSIS — R197 Diarrhea, unspecified: Secondary | ICD-10-CM

## 2019-01-01 MED ORDER — CHOLESTYRAMINE 4 G PO PACK: PACK | ORAL | 11 refills | Status: DC

## 2019-01-01 MED ORDER — HYDROCORTISONE 2.5 % EX OINT: TOPICAL_OINTMENT | Freq: Two times a day (BID) | CUTANEOUS | 0 refills | Status: DC

## 2019-01-03 ENCOUNTER — Other Ambulatory Visit: Payer: Self-pay

## 2019-01-03 ENCOUNTER — Encounter (INDEPENDENT_AMBULATORY_CARE_PROVIDER_SITE_OTHER): Payer: Self-pay | Admitting: Nurse Practitioner

## 2019-01-03 ENCOUNTER — Ambulatory Visit (INDEPENDENT_AMBULATORY_CARE_PROVIDER_SITE_OTHER): Payer: Medicare Other | Admitting: Nurse Practitioner

## 2019-01-03 VITALS — BP 153/82 | HR 87 | Resp 16 | Wt 186.0 lb

## 2019-01-03 DIAGNOSIS — I890 Lymphedema, not elsewhere classified: Secondary | ICD-10-CM | POA: Diagnosis not present

## 2019-01-03 DIAGNOSIS — K219 Gastro-esophageal reflux disease without esophagitis: Secondary | ICD-10-CM | POA: Diagnosis not present

## 2019-01-03 DIAGNOSIS — I10 Essential (primary) hypertension: Secondary | ICD-10-CM | POA: Diagnosis not present

## 2019-01-03 NOTE — Progress Notes (Signed)
SUBJECTIVE:  Patient ID: Kristi [name], female    DOB: February 23, 1941, 78 y.o.   MRN: [id number] Chief Complaint  Patient presents with  . Follow-up    unna check    HPI  Kristi [name] is a 78 y.o. female the presents today for Unna wrap follow-up.  Patient was last seen in office at the end of August, however she was lost to follow-up due to being hospitalized for depression.  Since she has been home the patient has been utilizing her lymphedema pump on a daily for at least an hour to an hour and a half.  The patient has difficulty with medical grade 1 compression stockings due to extensive arthritis in her hands.  The patient endorses elevating her lower extremities as much as possible.  She denies any fever, chills, nausea, vomiting pain or diarrhea.  She denies any chest pain or shortness of breath.  She denies any weeping of her lower extremities.  No ulcerations present  Past Medical History:  Diagnosis Date  . Anxiety   . Arthritis   . Broken arm    left FA 3/17  . Broken arm    left  . CAD (coronary artery disease)    s/p MI  . Cancer ([place])    uterine  . Cervical disc disorder   . Depression   . Diabetes mellitus without complication ([place])   . GERD (gastroesophageal reflux disease)   . Headache   . Hyperlipidemia   . Hypertension   . Hypothyroid   . Myocardial infarction ([place])    15 year ago  . Neck pain 06/04/2014  . Parkinson's disease ([place])   . Presence of permanent cardiac pacemaker   . Spinal stenosis     Past Surgical History:  Procedure Laterality Date  . ABDOMINAL HYSTERECTOMY     partial  . BREAST SURGERY    . COLONOSCOPY    . COLONOSCOPY WITH PROPOFOL N/A 11/29/2017   Procedure: COLONOSCOPY WITH PROPOFOL;  Surgeon: [name], MD;  Location: [facility] ENDOSCOPY;  Service: Endoscopy;  Laterality: N/A;  . CORONARY ANGIOPLASTY     stent  . CORONARY ARTERY BYPASS GRAFT    . CORONARY STENT PLACEMENT    . ESOPHAGOGASTRODUODENOSCOPY (EGD) WITH PROPOFOL  N/A 04/18/2017   Procedure: ESOPHAGOGASTRODUODENOSCOPY (EGD) WITH PROPOFOL;  Surgeon: [name], MD;  Location: [facility] ENDOSCOPY;  Service: Endoscopy;  Laterality: N/A;  . ESOPHAGOGASTRODUODENOSCOPY (EGD) WITH PROPOFOL N/A 11/29/2017   Procedure: ESOPHAGOGASTRODUODENOSCOPY (EGD) WITH PROPOFOL;  Surgeon: [name], MD;  Location: [facility] ENDOSCOPY;  Service: Endoscopy;  Laterality: N/A;  . INSERT / REPLACE / REMOVE PACEMAKER    . s/p pacer insertion    . TRANSANAL EXCISION OF RECTAL MASS N/A 01/17/2018   Procedure: TRANSANAL EXCISION OF RECTAL POLYP;  Surgeon: [name], MD;  Location: [facility] ORS;  Service: General;  Laterality: N/A;    Social History   Socioeconomic History  . Marital status: Widowed    Spouse name: Not on file  . Number of children: Not on file  . Years of education: Not on file  . Highest education level: Not on file  Occupational History  . Occupation: retired  Scientific laboratory technician  . Financial resource strain: Not on file  . Food insecurity    Worry: Not on file    Inability: Not on file  . Transportation needs    Medical: Not on file    Non-medical: Not on file  Tobacco Use  . Smoking status:  Never Smoker  . Smokeless tobacco: Never Used  Substance and Sexual Activity  . Alcohol use: No  . Drug use: No  . Sexual activity: Never  Lifestyle  . Physical activity    Days per week: Not on file    Minutes per session: Not on file  . Stress: Not on file  Relationships  . Social Herbalist on phone: Not on file    Gets together: Not on file    Attends religious service: Not on file    Active member of club or organization: Not on file    Attends meetings of clubs or organizations: Not on file    Relationship status: Not on file  . Intimate partner violence    Fear of current or ex partner: Not on file    Emotionally abused: Not on file    Physically abused: Not on file    Forced sexual activity: Not on file  Other Topics Concern  . Not  on file  Social History Narrative  . Not on file    Family History  Problem Relation Age of Onset  . Cancer Mother   . Depression Mother   . Diabetes Mother   . Hypertension Mother   . Aneurysm Mother   . Heart disease Father   . Diabetes Sister   . Diabetes Brother   . Diabetes Brother     Allergies  Allergen Reactions  . Penicillins Anaphylaxis  . Penicillin G Swelling and Rash    Has patient had a PCN reaction causing immediate rash, facial/tongue/throat swelling, SOB or lightheadedness with hypotension: Yes Has patient had a PCN reaction causing severe rash involving mucus membranes or skin necrosis: No Has patient had a PCN reaction that required hospitalization: No Has patient had a PCN reaction occurring within the last 10 years: No If all of the above answers are "NO", then may proceed with Cephalosporin use.      Review of Systems   Review of Systems: Negative Unless Checked Constitutional: [] Weight loss  [] Fever  [] Chills Cardiac: [] Chest pain   []  Atrial Fibrillation  [] Palpitations   [] Shortness of breath when laying flat   [] Shortness of breath with exertion. [] Shortness of breath at rest Vascular:  [] Pain in legs with walking   [] Pain in legs with standing [] Pain in legs when laying flat   [] Claudication    [] Pain in feet when laying flat    [] History of DVT   [] Phlebitis   [x] Swelling in legs   [x] Varicose veins   [] Non-healing ulcers Pulmonary:   [] Uses home oxygen   [] Productive cough   [] Hemoptysis   [] Wheeze  [] COPD   [] Asthma Neurologic:  [] Dizziness   [] Seizures  [] Blackouts [] History of stroke   [x] History of TIA  [] Aphasia   [] Temporary Blindness   [] Weakness or numbness in arm   [] Weakness or numbness in leg Musculoskeletal:   [] Joint swelling   [] Joint pain   [] Low back pain  []  History of Knee Replacement [] Arthritis [] back Surgeries  []  Spinal Stenosis    Hematologic:  [] Easy bruising  [] Easy bleeding   [] Hypercoagulable state   [] Anemic  Gastrointestinal:  [] Diarrhea   [] Vomiting  [x] Gastroesophageal reflux/heartburn   [] Difficulty swallowing. [] Abdominal pain Genitourinary:  [] Chronic kidney disease   [] Difficult urination  [] Anuric   [] Blood in urine [] Frequent urination  [] Burning with urination   [] Hematuria Skin:  [] Rashes   [] Ulcers [] Wounds Psychological:  [x] History of anxiety   [x]  History of major depression  []   Memory Difficulties      OBJECTIVE:   Physical Exam  BP (!) 153/82 (BP Location: Right Arm)   Pulse 87   Resp 16   Wt 186 lb (84.4 kg)   BMI 30.02 kg/m   Gen: WD/WN, NAD Head: [place]/AT, No temporalis wasting.  Ear/Nose/Throat: Hearing grossly intact, nares w/o erythema or drainage Eyes: PER, EOMI, sclera nonicteric.  Neck: Supple, no masses.  No JVD.  Pulmonary:  Good air movement, no use of accessory muscles.  Cardiac: RRR Vascular:  3+ edema right lower extremity with 2+ on left.  Scattered varicosities bilaterally Vessel Right Left  Radial Palpable Palpable  Dorsalis Pedis Palpable Palpable  Posterior Tibial Palpable Palpable   Gastrointestinal: soft, non-distended. No guarding/no peritoneal signs.  Musculoskeletal: Extensive arthritis in hands, decreased strength.  No deformity or atrophy.  Neurologic: Pain and light touch intact in extremities.  Symmetrical.  Speech is fluent. Motor exam as listed above. Psychiatric: Judgment intact, Mood & affect appropriate for pt's clinical situation. Dermatologic: No Venous rashes. No Ulcers Noted.  No changes consistent with cellulitis. Lymph : No Cervical lymphadenopathy, no lichenification or dermal thickening bilaterally       ASSESSMENT AND PLAN:  1. Lymphedema We will wrap the patient's bilateral lower extremities today in order to gain control of her edema.  I have also instructed the patient to continue to utilize her lymphedema pump on a daily basis, in fact increasing usage up to twice a day.  She is also encouraged to continue to utilize  them while she has her wraps on.  The patient was also encouraged to elevate her lower extremities when she is not ambulating.  The patient is also encouraged to ambulate for 30 minutes a day.  We will have the patient return to the office in 4 weeks for interim evaluation.  In the meantime, the patient will present weekly for Unna wrap changes.  2. Essential hypertension Continue antihypertensive medications as already ordered, these medications have been reviewed and there are no changes at this time.   3. Gastroesophageal reflux disease, unspecified whether esophagitis present Continue PPI as already ordered, this medication has been reviewed and there are no changes at this time.  Avoidence of caffeine and alcohol  Moderate elevation of the head of the bed    Current Outpatient Medications on File Prior to Visit  Medication Sig Dispense Refill  . amiodarone (PACERONE) 200 MG tablet Take 200 mg by mouth daily.    [name] aspirin EC 81 MG tablet Take 81 mg by mouth daily.    . cholestyramine (QUESTRAN) 4 g packet One packet daily in water 30 each 11  . furosemide (LASIX) 40 MG tablet Take 40 mg by mouth daily.     [name] gabapentin (NEURONTIN) 300 MG capsule Take 300-600 mg by mouth See admin instructions. Take 300 mg by mouth and 600 mg at bedtime    . glimepiride (AMARYL) 1 MG tablet Take 1 tablet (1 mg total) by mouth daily with breakfast. (Patient taking differently: Take 2 mg by mouth daily. ) 30 tablet 2  . hydrocortisone 2.5 % ointment Apply topically 2 (two) times daily. 30 g 0  . hydrOXYzine (ATARAX/VISTARIL) 25 MG tablet Take 25 mg by mouth 2 (two) times daily.    [name] levocetirizine (XYZAL) 5 MG tablet Take 5 mg by mouth daily.    [name] levothyroxine (SYNTHROID, LEVOTHROID) 137 MCG tablet Take 137 mcg by mouth daily before breakfast. Take on an empty stomach with a glass  of water at least 30-60 minutes before breakfast    . Linaclotide (LINZESS) 145 MCG CAPS capsule Take 145 mcg by mouth daily.     [name] loratadine (CLARITIN) 10 MG tablet Take by mouth.    . meclizine (ANTIVERT) 12.5 MG tablet Take 12.5 mg by mouth 2 (two) times daily as needed.     . Melatonin 5 MG CAPS Take by mouth.    . montelukast (SINGULAIR) 10 MG tablet Take 10 mg by mouth at bedtime.    . nitroGLYCERIN (NITROSTAT) 0.4 MG SL tablet Place under the tongue.    [name] omeprazole (PRILOSEC) 40 MG capsule Take 40 mg by mouth 2 (two) times daily.    . potassium chloride SA (K-DUR,KLOR-CON) 20 MEQ tablet Take 20 mEq by mouth daily.     . rosuvastatin (CRESTOR) 40 MG tablet Take 40 mg by mouth daily.    . sertraline (ZOLOFT) 100 MG tablet Take 100 mg by mouth 2 (two) times daily.    . sucralfate (CARAFATE) 1 g tablet Take 1 g by mouth 3 (three) times daily. Dissolve one tablet in one tablespoon of water to create slurry and take by mouth three times daily **hold Vitamin D while taking**     No current facility-administered medications on file prior to visit.     There are no Patient Instructions on file for this visit. No follow-ups on file.   Kris Hartmann, NP  This note was completed with Sales executive.  Any errors are purely unintentional.

## 2019-01-09 ENCOUNTER — Encounter: Payer: Self-pay | Admitting: *Deleted

## 2019-01-10 ENCOUNTER — Encounter (INDEPENDENT_AMBULATORY_CARE_PROVIDER_SITE_OTHER): Payer: Self-pay

## 2019-01-10 ENCOUNTER — Ambulatory Visit (INDEPENDENT_AMBULATORY_CARE_PROVIDER_SITE_OTHER): Payer: Medicare Other | Admitting: Nurse Practitioner

## 2019-01-10 ENCOUNTER — Other Ambulatory Visit: Payer: Self-pay

## 2019-01-10 VITALS — BP 124/75 | HR 74 | Resp 16 | Wt 185.8 lb

## 2019-01-10 DIAGNOSIS — I890 Lymphedema, not elsewhere classified: Secondary | ICD-10-CM | POA: Diagnosis not present

## 2019-01-10 NOTE — Progress Notes (Signed)
History of Present Illness  There is no documented history at this time  Assessments & Plan   There are no diagnoses linked to this encounter.    Additional instructions  Subjective:  Patient presents with venous ulcer of the Bilateral lower extremity.    Procedure:  3 layer unna wrap was placed Bilateral lower extremity.   Plan:   Follow up in one week.  

## 2019-01-17 ENCOUNTER — Encounter (INDEPENDENT_AMBULATORY_CARE_PROVIDER_SITE_OTHER): Payer: Self-pay

## 2019-01-17 ENCOUNTER — Ambulatory Visit (INDEPENDENT_AMBULATORY_CARE_PROVIDER_SITE_OTHER): Payer: Medicare Other | Admitting: Nurse Practitioner

## 2019-01-17 ENCOUNTER — Other Ambulatory Visit: Payer: Self-pay

## 2019-01-17 VITALS — BP 125/75 | HR 77 | Resp 16 | Wt 186.0 lb

## 2019-01-17 DIAGNOSIS — I890 Lymphedema, not elsewhere classified: Secondary | ICD-10-CM

## 2019-01-17 NOTE — Progress Notes (Signed)
History of Present Illness  There is no documented history at this time  Assessments & Plan   There are no diagnoses linked to this encounter.    Additional instructions  Subjective:  Patient presents with venous ulcer of the Bilateral lower extremity.    Procedure:  3 layer unna wrap was placed Bilateral lower extremity.   Plan:   Follow up in one week.  

## 2019-01-20 ENCOUNTER — Encounter (INDEPENDENT_AMBULATORY_CARE_PROVIDER_SITE_OTHER): Payer: Self-pay | Admitting: Nurse Practitioner

## 2019-01-23 ENCOUNTER — Ambulatory Visit (INDEPENDENT_AMBULATORY_CARE_PROVIDER_SITE_OTHER): Payer: Medicare Other | Admitting: Nurse Practitioner

## 2019-01-23 ENCOUNTER — Other Ambulatory Visit: Payer: Self-pay

## 2019-01-23 ENCOUNTER — Encounter (INDEPENDENT_AMBULATORY_CARE_PROVIDER_SITE_OTHER): Payer: Self-pay | Admitting: Nurse Practitioner

## 2019-01-23 VITALS — BP 124/78 | HR 81 | Resp 16 | Wt 184.0 lb

## 2019-01-23 DIAGNOSIS — K219 Gastro-esophageal reflux disease without esophagitis: Secondary | ICD-10-CM

## 2019-01-23 DIAGNOSIS — I890 Lymphedema, not elsewhere classified: Secondary | ICD-10-CM | POA: Diagnosis not present

## 2019-01-23 DIAGNOSIS — I10 Essential (primary) hypertension: Secondary | ICD-10-CM | POA: Diagnosis not present

## 2019-01-24 ENCOUNTER — Ambulatory Visit (INDEPENDENT_AMBULATORY_CARE_PROVIDER_SITE_OTHER): Payer: Medicare Other | Admitting: Nurse Practitioner

## 2019-01-27 ENCOUNTER — Encounter (INDEPENDENT_AMBULATORY_CARE_PROVIDER_SITE_OTHER): Payer: Self-pay | Admitting: Nurse Practitioner

## 2019-01-27 NOTE — Progress Notes (Signed)
SUBJECTIVE:  Patient ID: Kristi [name], female    DOB: Oct 15, 1940, 78 y.o.   MRN: [id number] Chief Complaint  Patient presents with  . Follow-up    unna boot check    HPI  Kristi [name] is a 78 y.o. female that presents today after being in a wraps for 4 weeks due to lower extremity ulceration, cellulitis and weeping.  Today the patient's lower extremities appear much better.  The patient states that her legs feel much better.  She denies any fever, chills, nausea, vomiting or diarrhea.  The patient currently has a lymphedema pump.  Past Medical History:  Diagnosis Date  . Anxiety   . Arthritis   . Broken arm    left FA 3/17  . Broken arm    left  . CAD (coronary artery disease)    s/p MI  . Cancer ([place])    uterine  . Cervical disc disorder   . Depression   . Diabetes mellitus without complication ([place])   . GERD (gastroesophageal reflux disease)   . Headache   . Hyperlipidemia   . Hypertension   . Hypothyroid   . Myocardial infarction ([place])    15 year ago  . Neck pain 06/04/2014  . Parkinson's disease ([place])   . Presence of permanent cardiac pacemaker   . Spinal stenosis     Past Surgical History:  Procedure Laterality Date  . ABDOMINAL HYSTERECTOMY     partial  . BREAST SURGERY    . COLONOSCOPY    . COLONOSCOPY WITH PROPOFOL N/A 11/29/2017   Procedure: COLONOSCOPY WITH PROPOFOL;  Surgeon: [name], MD;  Location: [facility] ENDOSCOPY;  Service: Endoscopy;  Laterality: N/A;  . CORONARY ANGIOPLASTY     stent  . CORONARY ARTERY BYPASS GRAFT    . CORONARY STENT PLACEMENT    . ESOPHAGOGASTRODUODENOSCOPY (EGD) WITH PROPOFOL N/A 04/18/2017   Procedure: ESOPHAGOGASTRODUODENOSCOPY (EGD) WITH PROPOFOL;  Surgeon: [name], MD;  Location: [facility] ENDOSCOPY;  Service: Endoscopy;  Laterality: N/A;  . ESOPHAGOGASTRODUODENOSCOPY (EGD) WITH PROPOFOL N/A 11/29/2017   Procedure: ESOPHAGOGASTRODUODENOSCOPY (EGD) WITH PROPOFOL;  Surgeon: [name], MD;  Location: [facility]  ENDOSCOPY;  Service: Endoscopy;  Laterality: N/A;  . INSERT / REPLACE / REMOVE PACEMAKER    . s/p pacer insertion    . TRANSANAL EXCISION OF RECTAL MASS N/A 01/17/2018   Procedure: TRANSANAL EXCISION OF RECTAL POLYP;  Surgeon: [name], MD;  Location: [facility] ORS;  Service: General;  Laterality: N/A;    Social History   Socioeconomic History  . Marital status: Widowed    Spouse name: Not on file  . Number of children: Not on file  . Years of education: Not on file  . Highest education level: Not on file  Occupational History  . Occupation: retired  Scientific laboratory technician  . Financial resource strain: Not on file  . Food insecurity    Worry: Not on file    Inability: Not on file  . Transportation needs    Medical: Not on file    Non-medical: Not on file  Tobacco Use  . Smoking status: Never Smoker  . Smokeless tobacco: Never Used  Substance and Sexual Activity  . Alcohol use: No  . Drug use: No  . Sexual activity: Never  Lifestyle  . Physical activity    Days per week: Not on file    Minutes per session: Not on file  . Stress: Not on file  Relationships  . Social connections  Talks on phone: Not on file    Gets together: Not on file    Attends religious service: Not on file    Active member of club or organization: Not on file    Attends meetings of clubs or organizations: Not on file    Relationship status: Not on file  . Intimate partner violence    Fear of current or ex partner: Not on file    Emotionally abused: Not on file    Physically abused: Not on file    Forced sexual activity: Not on file  Other Topics Concern  . Not on file  Social History Narrative  . Not on file    Family History  Problem Relation Age of Onset  . Cancer Mother   . Depression Mother   . Diabetes Mother   . Hypertension Mother   . Aneurysm Mother   . Heart disease Father   . Diabetes Sister   . Diabetes Brother   . Diabetes Brother     Allergies  Allergen Reactions  .  Penicillins Anaphylaxis  . Penicillin G Swelling and Rash    Has patient had a PCN reaction causing immediate rash, facial/tongue/throat swelling, SOB or lightheadedness with hypotension: Yes Has patient had a PCN reaction causing severe rash involving mucus membranes or skin necrosis: No Has patient had a PCN reaction that required hospitalization: No Has patient had a PCN reaction occurring within the last 10 years: No If all of the above answers are "NO", then may proceed with Cephalosporin use.      Review of Systems   Review of Systems: Negative Unless Checked Constitutional: [] Weight loss  [] Fever  [] Chills Cardiac: [] Chest pain   []  Atrial Fibrillation  [] Palpitations   [] Shortness of breath when laying flat   [] Shortness of breath with exertion. [] Shortness of breath at rest Vascular:  [] Pain in legs with walking   [] Pain in legs with standing [] Pain in legs when laying flat   [] Claudication    [] Pain in feet when laying flat    [] History of DVT   [] Phlebitis   [x] Swelling in legs   [] Varicose veins   [] Non-healing ulcers Pulmonary:   [] Uses home oxygen   [] Productive cough   [] Hemoptysis   [] Wheeze  [] COPD   [] Asthma Neurologic:  [] Dizziness   [] Seizures  [] Blackouts [] History of stroke   [] History of TIA  [] Aphasia   [] Temporary Blindness   [] Weakness or numbness in arm   [] Weakness or numbness in leg Musculoskeletal:   [] Joint swelling   [] Joint pain   [] Low back pain  []  History of Knee Replacement [x] Arthritis [] back Surgeries  []  Spinal Stenosis    Hematologic:  [] Easy bruising  [] Easy bleeding   [] Hypercoagulable state   [] Anemic Gastrointestinal:  [] Diarrhea   [] Vomiting  [x] Gastroesophageal reflux/heartburn   [] Difficulty swallowing. [] Abdominal pain Genitourinary:  [] Chronic kidney disease   [] Difficult urination  [] Anuric   [] Blood in urine [] Frequent urination  [] Burning with urination   [] Hematuria Skin:  [] Rashes   [] Ulcers [] Wounds Psychological:  [x] History of anxiety    [x]  History of major depression  []  Memory Difficulties      OBJECTIVE:   Physical Exam  BP 124/78 (BP Location: Right Arm)   Pulse 81   Resp 16   Wt 184 lb (83.5 kg)   BMI 29.70 kg/m   Gen: WD/WN, NAD Head: [place]/AT, No temporalis wasting.  Ear/Nose/Throat: Hearing grossly intact, nares w/o erythema or drainage Eyes: PER, EOMI, sclera nonicteric.  Neck:  Supple, no masses.  No JVD.  Pulmonary:  Good air movement, no use of accessory muscles.  Cardiac: RRR Vascular:  Minimal edema bilaterally no evidence of weeping or ulceration, bilateral stasis dermatitis Vessel Right Left  Radial Palpable Palpable  Dorsalis Pedis Palpable Palpable  Posterior Tibial Palpable Palpable   Gastrointestinal: soft, non-distended. No guarding/no peritoneal signs.  Musculoskeletal: M/S 5/5 throughout.  No deformity or atrophy.  Neurologic: Pain and light touch intact in extremities.  Symmetrical.  Speech is fluent. Motor exam as listed above. Psychiatric: Judgment intact, Mood & affect appropriate for pt's clinical situation. Dermatologic: No Venous rashes. No Ulcers Noted.  No changes consistent with cellulitis. Lymph : No Cervical lymphadenopathy, dermal thickening bilaterally       ASSESSMENT AND PLAN:  1. Lymphedema The patient will be removed from [place] wraps today.  She will transition to utilize her medical grade 1 compression stockings on a daily basis, she will elevate her lower extremities much as possible as well as attempt to exercise 30 minutes/day.  The patient will also utilize her lymphedema pump for at least an hour on a daily basis.  She will return in 6 months for evaluation of her lymphedema.  She will return sooner if her weeping or ulcerations return.  2. Gastroesophageal reflux disease, unspecified whether esophagitis present Continue PPI as already ordered, this medication has been reviewed and there are no changes at this time.  Avoidence of caffeine and alcohol  Moderate  elevation of the head of the bed   3. Essential hypertension Continue antihypertensive medications as already ordered, these medications have been reviewed and there are no changes at this time.    Current Outpatient Medications on File Prior to Visit  Medication Sig Dispense Refill  . amiodarone (PACERONE) 200 MG tablet Take 200 mg by mouth daily.    [name] aspirin EC 81 MG tablet Take 81 mg by mouth daily.    . cholestyramine (QUESTRAN) 4 g packet One packet daily in water 30 each 11  . furosemide (LASIX) 40 MG tablet Take 40 mg by mouth daily.     [name] gabapentin (NEURONTIN) 300 MG capsule Take 300-600 mg by mouth See admin instructions. Take 300 mg by mouth and 600 mg at bedtime    . glimepiride (AMARYL) 1 MG tablet Take 1 tablet (1 mg total) by mouth daily with breakfast. (Patient taking differently: Take 2 mg by mouth daily. ) 30 tablet 2  . hydrocortisone 2.5 % ointment Apply topically 2 (two) times daily. 30 g 0  . hydrOXYzine (ATARAX/VISTARIL) 25 MG tablet Take 25 mg by mouth 2 (two) times daily.    [name] levocetirizine (XYZAL) 5 MG tablet Take 5 mg by mouth daily.    [name] levothyroxine (SYNTHROID, LEVOTHROID) 137 MCG tablet Take 137 mcg by mouth daily before breakfast. Take on an empty stomach with a glass of water at least 30-60 minutes before breakfast    . Linaclotide (LINZESS) 145 MCG CAPS capsule Take 145 mcg by mouth daily.    [name] loratadine (CLARITIN) 10 MG tablet Take by mouth.    . meclizine (ANTIVERT) 12.5 MG tablet Take 12.5 mg by mouth 2 (two) times daily as needed.     . Melatonin 5 MG CAPS Take by mouth.    . montelukast (SINGULAIR) 10 MG tablet Take 10 mg by mouth at bedtime.    . nitroGLYCERIN (NITROSTAT) 0.4 MG SL tablet Place under the tongue.    [name] omeprazole (PRILOSEC) 40 MG capsule Take 40  mg by mouth 2 (two) times daily.    . potassium chloride SA (K-DUR,KLOR-CON) 20 MEQ tablet Take 20 mEq by mouth daily.     . rosuvastatin (CRESTOR) 40 MG tablet Take 40 mg by mouth daily.     . sertraline (ZOLOFT) 100 MG tablet Take 100 mg by mouth 2 (two) times daily.    . sucralfate (CARAFATE) 1 g tablet Take 1 g by mouth 3 (three) times daily. Dissolve one tablet in one tablespoon of water to create slurry and take by mouth three times daily **hold Vitamin D while taking**     No current facility-administered medications on file prior to visit.     There are no Patient Instructions on file for this visit. No follow-ups on file.   Kris Hartmann, NP  This note was completed with Sales executive.  Any errors are purely unintentional.

## 2019-03-25 ENCOUNTER — Ambulatory Visit: Payer: Medicare Other | Admitting: Obstetrics and Gynecology

## 2019-04-01 ENCOUNTER — Ambulatory Visit: Payer: Medicare Other | Admitting: Obstetrics and Gynecology

## 2019-04-04 ENCOUNTER — Telehealth: Payer: Self-pay | Admitting: *Deleted

## 2019-04-04 ENCOUNTER — Ambulatory Visit: Payer: Medicare Other

## 2019-04-04 NOTE — Telephone Encounter (Signed)
Patient called to reschedule appointment for 1/18 she has no transportation.

## 2019-04-04 NOTE — Telephone Encounter (Signed)
Kristi [name] please contact patient and see when would be best for her to reschedule. Thanks.

## 2019-04-05 NOTE — Telephone Encounter (Signed)
Opened in error

## 2019-04-08 ENCOUNTER — Inpatient Hospital Stay: Payer: Medicaid Other | Admitting: Oncology

## 2019-04-08 ENCOUNTER — Inpatient Hospital Stay: Payer: Medicaid Other

## 2019-04-08 ENCOUNTER — Ambulatory Visit: Payer: Medicaid Other | Admitting: Radiation Oncology

## 2019-04-15 ENCOUNTER — Telehealth: Payer: Self-pay

## 2019-04-15 ENCOUNTER — Other Ambulatory Visit: Payer: Self-pay

## 2019-04-15 ENCOUNTER — Ambulatory Visit (INDEPENDENT_AMBULATORY_CARE_PROVIDER_SITE_OTHER): Payer: Medicare Other | Admitting: Vascular Surgery

## 2019-04-15 ENCOUNTER — Encounter (INDEPENDENT_AMBULATORY_CARE_PROVIDER_SITE_OTHER): Payer: Self-pay | Admitting: Vascular Surgery

## 2019-04-15 VITALS — BP 172/80 | HR 85 | Resp 16 | Wt 194.0 lb

## 2019-04-15 DIAGNOSIS — I10 Essential (primary) hypertension: Secondary | ICD-10-CM

## 2019-04-15 DIAGNOSIS — K219 Gastro-esophageal reflux disease without esophagitis: Secondary | ICD-10-CM

## 2019-04-15 DIAGNOSIS — I890 Lymphedema, not elsewhere classified: Secondary | ICD-10-CM

## 2019-04-15 DIAGNOSIS — I219 Acute myocardial infarction, unspecified: Secondary | ICD-10-CM | POA: Diagnosis not present

## 2019-04-15 DIAGNOSIS — E782 Mixed hyperlipidemia: Secondary | ICD-10-CM

## 2019-04-15 NOTE — Progress Notes (Signed)
MRN : [id number]  Kristi [name] is a 79 y.o. (15-Jan-1941) female who presents with chief complaint of  Chief Complaint  Patient presents with  . Follow-up    54month follow up  .  History of Present Illness:    The patient returns to the office for followup evaluation regarding leg swelling.  The swelling has persisted but with the lymph pump the patient states the swelling is much better controlled. The pain associated with swelling is essentially eliminated. There have not been any interval development of a ulcerations or wounds.  No episodes of cellulitis or infection over the past 12 months  The patient denies problems with the pump, noting it is working well and the leggings are in good condition.  Since the previous visit the patient has been wearing graduated compression stockings and using the lymph pump on a routine basis and  has noted significant improvement in the lymphedema.   Patient stated the lymph pump has been a very positive factor in her care.     Current Meds  Medication Sig  . amiodarone (PACERONE) 200 MG tablet Take 200 mg by mouth daily.  [name] aspirin EC 81 MG tablet Take 81 mg by mouth daily.  . cholestyramine (QUESTRAN) 4 g packet One packet daily in water  . furosemide (LASIX) 40 MG tablet Take 40 mg by mouth daily.   [name] gabapentin (NEURONTIN) 300 MG capsule Take 300-600 mg by mouth See admin instructions. Take 300 mg by mouth and 600 mg at bedtime  . glimepiride (AMARYL) 1 MG tablet Take 1 tablet (1 mg total) by mouth daily with breakfast. (Patient taking differently: Take 2 mg by mouth daily. )  . hydrocortisone 2.5 % ointment Apply topically 2 (two) times daily.  . hydrOXYzine (ATARAX/VISTARIL) 25 MG tablet Take 25 mg by mouth 2 (two) times daily.  [name] levocetirizine (XYZAL) 5 MG tablet Take 5 mg by mouth daily.  [name] levothyroxine (SYNTHROID, LEVOTHROID) 137 MCG tablet Take 137 mcg by mouth daily before breakfast. Take on an empty stomach with a glass of water  at least 30-60 minutes before breakfast  . Linaclotide (LINZESS) 145 MCG CAPS capsule Take 145 mcg by mouth daily.  [name] loratadine (CLARITIN) 10 MG tablet Take by mouth.  . meclizine (ANTIVERT) 12.5 MG tablet Take 12.5 mg by mouth 2 (two) times daily as needed.   . Melatonin 5 MG CAPS Take by mouth.  . montelukast (SINGULAIR) 10 MG tablet Take 10 mg by mouth at bedtime.  . nitroGLYCERIN (NITROSTAT) 0.4 MG SL tablet Place under the tongue.  [name] omeprazole (PRILOSEC) 40 MG capsule Take 40 mg by mouth 2 (two) times daily.  . potassium chloride SA (K-DUR,KLOR-CON) 20 MEQ tablet Take 20 mEq by mouth daily.   . rosuvastatin (CRESTOR) 40 MG tablet Take 40 mg by mouth daily.  . sertraline (ZOLOFT) 100 MG tablet Take 100 mg by mouth 2 (two) times daily.  . sucralfate (CARAFATE) 1 g tablet Take 1 g by mouth 3 (three) times daily. Dissolve one tablet in one tablespoon of water to create slurry and take by mouth three times daily **hold Vitamin D while taking**    Past Medical History:  Diagnosis Date  . Anxiety   . Arthritis   . Broken arm    left FA 3/17  . Broken arm    left  . CAD (coronary artery disease)    s/p MI  . Cancer ([place])    uterine  . Cervical disc  disorder   . Depression   . Diabetes mellitus without complication ([place])   . GERD (gastroesophageal reflux disease)   . Headache   . Hyperlipidemia   . Hypertension   . Hypothyroid   . Myocardial infarction ([place])    15 year ago  . Neck pain 06/04/2014  . Parkinson's disease ([place])   . Presence of permanent cardiac pacemaker   . Spinal stenosis     Past Surgical History:  Procedure Laterality Date  . ABDOMINAL HYSTERECTOMY     partial  . BREAST SURGERY    . COLONOSCOPY    . COLONOSCOPY WITH PROPOFOL N/A 11/29/2017   Procedure: COLONOSCOPY WITH PROPOFOL;  Surgeon: [name], MD;  Location: [facility] ENDOSCOPY;  Service: Endoscopy;  Laterality: N/A;  . CORONARY ANGIOPLASTY     stent  . CORONARY ARTERY BYPASS GRAFT    .  CORONARY STENT PLACEMENT    . ESOPHAGOGASTRODUODENOSCOPY (EGD) WITH PROPOFOL N/A 04/18/2017   Procedure: ESOPHAGOGASTRODUODENOSCOPY (EGD) WITH PROPOFOL;  Surgeon: [name], MD;  Location: [facility] ENDOSCOPY;  Service: Endoscopy;  Laterality: N/A;  . ESOPHAGOGASTRODUODENOSCOPY (EGD) WITH PROPOFOL N/A 11/29/2017   Procedure: ESOPHAGOGASTRODUODENOSCOPY (EGD) WITH PROPOFOL;  Surgeon: [name], MD;  Location: [facility] ENDOSCOPY;  Service: Endoscopy;  Laterality: N/A;  . INSERT / REPLACE / REMOVE PACEMAKER    . s/p pacer insertion    . TRANSANAL EXCISION OF RECTAL MASS N/A 01/17/2018   Procedure: TRANSANAL EXCISION OF RECTAL POLYP;  Surgeon: [name], MD;  Location: [facility] ORS;  Service: General;  Laterality: N/A;    Social History Social History   Tobacco Use  . Smoking status: Never Smoker  . Smokeless tobacco: Never Used  Substance Use Topics  . Alcohol use: No  . Drug use: No    Family History Family History  Problem Relation Age of Onset  . Cancer Mother   . Depression Mother   . Diabetes Mother   . Hypertension Mother   . Aneurysm Mother   . Heart disease Father   . Diabetes Sister   . Diabetes Brother   . Diabetes Brother     Allergies  Allergen Reactions  . Penicillins Anaphylaxis  . Penicillin G Swelling and Rash    Has patient had a PCN reaction causing immediate rash, facial/tongue/throat swelling, SOB or lightheadedness with hypotension: Yes Has patient had a PCN reaction causing severe rash involving mucus membranes or skin necrosis: No Has patient had a PCN reaction that required hospitalization: No Has patient had a PCN reaction occurring within the last 10 years: No If all of the above answers are "NO", then may proceed with Cephalosporin use.      REVIEW OF SYSTEMS (Negative unless checked)  Constitutional: [] Weight loss  [] Fever  [] Chills Cardiac: [] Chest pain   [] Chest pressure   [] Palpitations   [] Shortness of breath when laying flat    [] Shortness of breath with exertion. Vascular:  [] Pain in legs with walking   [] Pain in legs at rest  [] History of DVT   [] Phlebitis   [x] Swelling in legs   [] Varicose veins   [] Non-healing ulcers Pulmonary:   [] Uses home oxygen   [] Productive cough   [] Hemoptysis   [] Wheeze  [] COPD   [] Asthma Neurologic:  [] Dizziness   [] Seizures   [] History of stroke   [] History of TIA  [] Aphasia   [] Vissual changes   [] Weakness or numbness in arm   [] Weakness or numbness in leg Musculoskeletal:   [] Joint swelling   [] Joint pain   []   Low back pain Hematologic:  [] Easy bruising  [] Easy bleeding   [] Hypercoagulable state   [] Anemic Gastrointestinal:  [] Diarrhea   [] Vomiting  [x] Gastroesophageal reflux/heartburn   [] Difficulty swallowing. Genitourinary:  [] Chronic kidney disease   [] Difficult urination  [] Frequent urination   [] Blood in urine Skin:  [] Rashes   [] Ulcers  Psychological:  [] History of anxiety   []  History of major depression.  Physical Examination  Vitals:   04/15/19 1353  BP: (!) 172/80  Pulse: 85  Resp: 16  Weight: 194 lb (88 kg)   Body mass index is 31.31 kg/m. Gen: WD/WN, NAD Head: [place]/AT, No temporalis wasting.  Ear/Nose/Throat: Hearing grossly intact, nares w/o erythema or drainage Eyes: PER, EOMI, sclera nonicteric.  Neck: Supple, no large masses.   Pulmonary:  Good air movement, no audible wheezing bilaterally, no use of accessory muscles.  Cardiac: RRR, no JVD Vascular: scattered varicosities present bilaterally.  Mild venous stasis changes to the legs bilaterally.  3+ soft pitting edema Vessel Right Left  Radial Palpable Palpable  Gastrointestinal: Non-distended. No guarding/no peritoneal signs.  Musculoskeletal: M/S 5/5 throughout.  No deformity or atrophy.  Neurologic: CN 2-12 intact. Symmetrical.  Speech is fluent. Motor exam as listed above. Psychiatric: Judgment intact, Mood & affect appropriate for pt's clinical situation. Dermatologic: venous rashes no ulcers noted.  No  changes consistent with cellulitis.   CBC Lab Results  Component Value Date   WBC 6.6 11/28/2018   HGB 12.8 11/28/2018   HCT 40.9 11/28/2018   MCV 92.5 11/28/2018   PLT 182 11/28/2018    BMET    Component Value Date/Time   NA 141 11/28/2018 2129   NA 143 02/19/2014 2041   K 3.8 11/28/2018 2129   K 3.5 02/19/2014 2041   CL 100 11/28/2018 2129   CL 106 02/19/2014 2041   CO2 29 11/28/2018 2129   CO2 28 02/19/2014 2041   GLUCOSE 157 (H) 11/28/2018 2129   GLUCOSE 124 (H) 02/19/2014 2041   BUN 20 11/28/2018 2129   BUN 7 02/19/2014 2041   CREATININE 0.94 11/28/2018 2129   CREATININE 0.92 02/19/2014 2041   CALCIUM 9.5 11/28/2018 2129   CALCIUM 9.1 02/19/2014 2041   GFRNONAA 57 (L) 11/28/2018 2129   GFRNONAA >60 02/19/2014 2041   GFRAA >60 11/28/2018 2129   GFRAA >60 02/19/2014 2041   CrCl cannot be calculated (Patient's most recent lab result is older than the maximum 21 days allowed.).  COAG Lab Results  Component Value Date   INR 0.86 12/04/2016   INR 0.98 11/17/2016    Radiology No results found.    Assessment/Plan 1. Lymphedema  No surgery or intervention at this point in time.    I have reviewed my discussion with the patient regarding lymphedema and why it  causes symptoms.  Patient will continue wearing graduated compression stockings class 1 (20-30 mmHg) on a daily basis a prescription was given. The patient is reminded to put the stockings on first thing in the morning and removing them in the evening. The patient is instructed specifically not to sleep in the stockings.   In addition, behavioral modification throughout the day will be continued.  This will include frequent elevation (such as in a recliner), use of over the counter pain medications as needed and exercise such as walking.  I have reviewed systemic causes for chronic edema such as liver, kidney and cardiac etiologies and there does not appear to be any significant changes in these organ  systems over the past year.  The patient is under the impression that these organ systems are all stable and unchanged.    The patient will continue aggressive use of the  lymph pump.  This will continue to improve the edema control and prevent sequela such as ulcers and infections.   The patient will follow-up with me on an annual basis.    2. Myocardial infarction, unspecified MI type, unspecified artery ([place]) Continue cardiac and antihypertensive medications as already ordered and reviewed, no changes at this time.  Continue statin as ordered and reviewed, no changes at this time  Nitrates PRN for chest pain   3. Essential hypertension Continue antihypertensive medications as already ordered, these medications have been reviewed and there are no changes at this time.   4. Gastroesophageal reflux disease, unspecified whether esophagitis present Continue PPI as already ordered, this medication has been reviewed and there are no changes at this time.  Avoidence of caffeine and alcohol  Moderate elevation of the head of the bed   5. Mixed hyperlipidemia Continue statin as ordered and reviewed, no changes at this time     Hortencia Pilar, MD  04/15/2019 2:01 PM

## 2019-04-15 NOTE — Telephone Encounter (Signed)
Per staff message from Quentin Angst: "Pt wants to cancel all her scheduled appts for January. She said, she has to many appts scheduled and she jut wants to cancel. Pt did not express r\s of appts. Sh also has a CT scan scheduled too."

## 2019-04-17 ENCOUNTER — Ambulatory Visit: Admission: RE | Admit: 2019-04-17 | Payer: Medicare Other | Source: Ambulatory Visit

## 2019-04-17 ENCOUNTER — Inpatient Hospital Stay: Payer: Medicare Other

## 2019-04-19 ENCOUNTER — Other Ambulatory Visit: Payer: Medicaid Other

## 2019-04-19 ENCOUNTER — Inpatient Hospital Stay: Payer: Medicare Other | Admitting: Oncology

## 2019-04-29 ENCOUNTER — Ambulatory Visit (INDEPENDENT_AMBULATORY_CARE_PROVIDER_SITE_OTHER): Payer: Medicare Other | Admitting: Vascular Surgery

## 2019-05-14 DIAGNOSIS — R143 Flatulence: Secondary | ICD-10-CM | POA: Insufficient documentation

## 2019-05-14 DIAGNOSIS — R140 Abdominal distension (gaseous): Secondary | ICD-10-CM | POA: Insufficient documentation

## 2019-05-14 DIAGNOSIS — K625 Hemorrhage of anus and rectum: Secondary | ICD-10-CM | POA: Insufficient documentation

## 2019-06-24 ENCOUNTER — Telehealth (INDEPENDENT_AMBULATORY_CARE_PROVIDER_SITE_OTHER): Payer: Self-pay | Admitting: Vascular Surgery

## 2019-06-24 NOTE — Telephone Encounter (Signed)
Called patient for her to be seen tomorrow and due to transportation can't come in. Looks like an appt was already scheduled for 4/16 and patient wants to wait until then. Sooner appt was offered and patient declined

## 2019-06-24 NOTE — Telephone Encounter (Signed)
Is spoke with Dr. Delana Meyer and he said to schedule the pt on tomorrow or Wednesday for a visit to see NP fallon and a BIL DVT LE  and also to make her aware that she needs to see her primary care.

## 2019-06-24 NOTE — Telephone Encounter (Signed)
Called stating legs are swollen and would like to be seen

## 2019-06-25 ENCOUNTER — Encounter (INDEPENDENT_AMBULATORY_CARE_PROVIDER_SITE_OTHER): Payer: Self-pay | Admitting: Nurse Practitioner

## 2019-06-25 ENCOUNTER — Other Ambulatory Visit: Payer: Self-pay

## 2019-06-25 ENCOUNTER — Other Ambulatory Visit (INDEPENDENT_AMBULATORY_CARE_PROVIDER_SITE_OTHER): Payer: Self-pay | Admitting: Vascular Surgery

## 2019-06-25 ENCOUNTER — Ambulatory Visit (INDEPENDENT_AMBULATORY_CARE_PROVIDER_SITE_OTHER): Payer: Medicare Other

## 2019-06-25 ENCOUNTER — Ambulatory Visit (INDEPENDENT_AMBULATORY_CARE_PROVIDER_SITE_OTHER): Payer: Medicare Other | Admitting: Nurse Practitioner

## 2019-06-25 VITALS — BP 145/85 | HR 92 | Resp 16 | Wt 202.0 lb

## 2019-06-25 DIAGNOSIS — M79604 Pain in right leg: Secondary | ICD-10-CM

## 2019-06-25 DIAGNOSIS — M79605 Pain in left leg: Secondary | ICD-10-CM

## 2019-06-25 DIAGNOSIS — R0600 Dyspnea, unspecified: Secondary | ICD-10-CM | POA: Diagnosis not present

## 2019-06-25 DIAGNOSIS — M7989 Other specified soft tissue disorders: Secondary | ICD-10-CM | POA: Diagnosis not present

## 2019-06-25 DIAGNOSIS — M5416 Radiculopathy, lumbar region: Secondary | ICD-10-CM

## 2019-06-25 DIAGNOSIS — I890 Lymphedema, not elsewhere classified: Secondary | ICD-10-CM

## 2019-06-25 DIAGNOSIS — R0609 Other forms of dyspnea: Secondary | ICD-10-CM

## 2019-06-25 NOTE — Progress Notes (Signed)
Subjective:    Patient ID: Kristi [name], female    DOB: [birth year], 79 y.o.   MRN: [id number] Chief Complaint  Patient presents with  . Follow-up    ultrasound follow up    Patient presents today for evaluation of lower extremity edema and discomfort.  The patient states that her lower extremity edema has been much worse lately.  The patient also endorses having issues with walking lately.  She states that the issues with walking are somewhat sporadic but doing extensive walking such as up and down stairs or for long periods makes it worse.  She states that it is a shooting pain from her buttocks down her leg.  She states that it also feels as if they go numb from time to time.  She denies any rest pain like symptoms.  Patient admittedly does not wear medical grade 1 compression stockings as she states she is unable to put them on.  The patient states that she is using her lymphedema pump on a nightly basis however.  The patient also endorses having a worsening of shortness of breath with exertion in addition to not being able to lie flat due to shortness of breath.  She denies any fever, chills, nausea, vomiting or diarrhea.  Today the patient has no evidence of DVT or superficial venous thrombosis bilaterally.   Review of Systems  Constitutional: Positive for fatigue.  Respiratory: Positive for shortness of breath.   Cardiovascular: Positive for leg swelling.  Neurological: Positive for weakness.       Objective:   Physical Exam Vitals reviewed.  Constitutional:      Appearance: Normal appearance.  Cardiovascular:     Rate and Rhythm: Normal rate.     Pulses:          Dorsalis pedis pulses are 2+ on the right side and 2+ on the left side.  Musculoskeletal:     Right lower leg: 3+ Edema present.     Left lower leg: 3+ Edema present.  Neurological:     Mental Status: She is alert. Mental status is at baseline.  Psychiatric:        Mood and Affect: Mood normal.        Behavior:  Behavior normal.     BP (!) 145/85 (BP Location: Right Arm)   Pulse 92   Resp 16   Wt 202 lb (91.6 kg)   BMI 32.60 kg/m   Past Medical History:  Diagnosis Date  . Anxiety   . Arthritis   . Broken arm    left FA 3/17  . Broken arm    left  . CAD (coronary artery disease)    s/p MI  . Cancer ([place])    uterine  . Cervical disc disorder   . Depression   . Diabetes mellitus without complication ([place])   . GERD (gastroesophageal reflux disease)   . Headache   . Hyperlipidemia   . Hypertension   . Hypothyroid   . Myocardial infarction ([place])    15 year ago  . Neck pain 06/04/2014  . Parkinson's disease ([place])   . Presence of permanent cardiac pacemaker   . Spinal stenosis     Social History   Socioeconomic History  . Marital status: Widowed    Spouse name: Not on file  . Number of children: Not on file  . Years of education: Not on file  . Highest education level: Not on file  Occupational History  . Occupation: retired  Tobacco  Use  . Smoking status: Never Smoker  . Smokeless tobacco: Never Used  Substance and Sexual Activity  . Alcohol use: No  . Drug use: No  . Sexual activity: Never  Other Topics Concern  . Not on file  Social History Narrative  . Not on file   Social Determinants of Health   Financial Resource Strain:   . Difficulty of Paying Living Expenses:   Food Insecurity:   . Worried About Charity fundraiser in the Last Year:   . Arboriculturist in the Last Year:   Transportation Needs:   . Film/video editor (Medical):   [name] Lack of Transportation (Non-Medical):   Physical Activity:   . Days of Exercise per Week:   . Minutes of Exercise per Session:   Stress:   . Feeling of Stress :   Social Connections:   . Frequency of Communication with Friends and Family:   . Frequency of Social Gatherings with Friends and Family:   . Attends Religious Services:   . Active Member of Clubs or Organizations:   . Attends Archivist  Meetings:   [name] Marital Status:   Intimate Partner Violence:   . Fear of Current or Ex-Partner:   . Emotionally Abused:   [name] Physically Abused:   . Sexually Abused:     Past Surgical History:  Procedure Laterality Date  . ABDOMINAL HYSTERECTOMY     partial  . BREAST SURGERY    . COLONOSCOPY    . COLONOSCOPY WITH PROPOFOL N/A 11/29/2017   Procedure: COLONOSCOPY WITH PROPOFOL;  Surgeon: [name], MD;  Location: [facility] ENDOSCOPY;  Service: Endoscopy;  Laterality: N/A;  . CORONARY ANGIOPLASTY     stent  . CORONARY ARTERY BYPASS GRAFT    . CORONARY STENT PLACEMENT    . ESOPHAGOGASTRODUODENOSCOPY (EGD) WITH PROPOFOL N/A 04/18/2017   Procedure: ESOPHAGOGASTRODUODENOSCOPY (EGD) WITH PROPOFOL;  Surgeon: [name], MD;  Location: [facility] ENDOSCOPY;  Service: Endoscopy;  Laterality: N/A;  . ESOPHAGOGASTRODUODENOSCOPY (EGD) WITH PROPOFOL N/A 11/29/2017   Procedure: ESOPHAGOGASTRODUODENOSCOPY (EGD) WITH PROPOFOL;  Surgeon: [name], MD;  Location: [facility] ENDOSCOPY;  Service: Endoscopy;  Laterality: N/A;  . INSERT / REPLACE / REMOVE PACEMAKER    . s/p pacer insertion    . TRANSANAL EXCISION OF RECTAL MASS N/A 01/17/2018   Procedure: TRANSANAL EXCISION OF RECTAL POLYP;  Surgeon: [name], MD;  Location: [facility] ORS;  Service: General;  Laterality: N/A;    Family History  Problem Relation Age of Onset  . Cancer Mother   . Depression Mother   . Diabetes Mother   . Hypertension Mother   . Aneurysm Mother   . Heart disease Father   . Diabetes Sister   . Diabetes Brother   . Diabetes Brother     Allergies  Allergen Reactions  . Penicillins Anaphylaxis  . Penicillin G Swelling and Rash    Has patient had a PCN reaction causing immediate rash, facial/tongue/throat swelling, SOB or lightheadedness with hypotension: Yes Has patient had a PCN reaction causing severe rash involving mucus membranes or skin necrosis: No Has patient had a PCN reaction that required  hospitalization: No Has patient had a PCN reaction occurring within the last 10 years: No If all of the above answers are "NO", then may proceed with Cephalosporin use.        Assessment & Plan:   1. Dyspnea on exertion The patient's worsening swelling is concerning for possible heart failure.  The  patient endorses having severe shortness of breath on exertion as well as orthopnea.  The patient states that sometimes she even begins to get short of breath even with excessive talking.  Previous echo shows slightly decreased EF at 50%.  The patient states that she has spoken with her primary care regarding this and he has advised her to speak with cardiology.  She states that her cardiologist tells her to speak with her primary care.  However the patient does have some evidence of issues with memory which may also be complicating this as well.  However, it would be in the patient's best interest to be evaluated for worsening heart failure as well as possible medication changes to help with her edema as well.  We will place a referral to a different cardiologist for a second opinion. - Ambulatory referral to Cardiology  2. Lymphedema No surgery or intervention at this point in time.    I have had a long discussion with the patient regarding venous insufficiency and why it  causes symptoms, specifically venous ulceration . I have discussed with the patient the chronic skin changes that accompany venous insufficiency and the long term sequela such as infection and recurring  ulceration.  Patient will be placed in [employer] which will be changed weekly drainage permitting.  In addition, behavioral modification including several periods of elevation of the lower extremities during the day will be continued. Achieving a position with the ankles at heart level was stressed to the patient  The patient is instructed to begin routine exercise, especially walking on a daily basis  The patient is also  advised to continue to utilize her lymphedema pump on a nightly basis.  We will have the patient return in 4 weeks for evaluation of progression with [place] wraps.   3. Lumbar radiculopathy (Multilevel) (Bilateral) Patient's description of pain are consistent with sciatica.  The patient does have a known diagnosis of sciatica and was recently seen by physiatry.  Patient will continue to follow with them.  Patient is to continue with prescribed medical therapy.   Current Outpatient Medications on File Prior to Visit  Medication Sig Dispense Refill  . amiodarone (PACERONE) 200 MG tablet Take 200 mg by mouth daily.    [name] aspirin EC 81 MG tablet Take 81 mg by mouth daily.    . cholestyramine (QUESTRAN) 4 g packet One packet daily in water 30 each 11  . furosemide (LASIX) 40 MG tablet Take 40 mg by mouth daily.     [name] gabapentin (NEURONTIN) 300 MG capsule Take 300-600 mg by mouth See admin instructions. Take 300 mg by mouth and 600 mg at bedtime    . glimepiride (AMARYL) 1 MG tablet Take 1 tablet (1 mg total) by mouth daily with breakfast. (Patient taking differently: Take 2 mg by mouth daily. ) 30 tablet 2  . hydrocortisone 2.5 % ointment Apply topically 2 (two) times daily. 30 g 0  . hydrOXYzine (ATARAX/VISTARIL) 25 MG tablet Take 25 mg by mouth 2 (two) times daily.    [name] levocetirizine (XYZAL) 5 MG tablet Take 5 mg by mouth daily.    [name] levothyroxine (SYNTHROID, LEVOTHROID) 137 MCG tablet Take 137 mcg by mouth daily before breakfast. Take on an empty stomach with a glass of water at least 30-60 minutes before breakfast    . Linaclotide (LINZESS) 145 MCG CAPS capsule Take 145 mcg by mouth daily.    [name] loratadine (CLARITIN) 10 MG tablet Take by mouth.    [name]  meclizine (ANTIVERT) 12.5 MG tablet Take 12.5 mg by mouth 2 (two) times daily as needed.     . Melatonin 5 MG CAPS Take by mouth.    . montelukast (SINGULAIR) 10 MG tablet Take 10 mg by mouth at bedtime.    . nitroGLYCERIN (NITROSTAT) 0.4 MG SL tablet  Place under the tongue.    [name] omeprazole (PRILOSEC) 40 MG capsule Take 40 mg by mouth 2 (two) times daily.    . potassium chloride SA (K-DUR,KLOR-CON) 20 MEQ tablet Take 20 mEq by mouth daily.     . rosuvastatin (CRESTOR) 40 MG tablet Take 40 mg by mouth daily.    . sertraline (ZOLOFT) 100 MG tablet Take 100 mg by mouth 2 (two) times daily.    . sucralfate (CARAFATE) 1 g tablet Take 1 g by mouth 3 (three) times daily. Dissolve one tablet in one tablespoon of water to create slurry and take by mouth three times daily **hold Vitamin D while taking**     No current facility-administered medications on file prior to visit.    There are no Patient Instructions on file for this visit. No follow-ups on file.   Kris Hartmann, NP

## 2019-07-02 ENCOUNTER — Other Ambulatory Visit: Payer: Self-pay

## 2019-07-02 ENCOUNTER — Ambulatory Visit (INDEPENDENT_AMBULATORY_CARE_PROVIDER_SITE_OTHER): Payer: Medicare Other | Admitting: Nurse Practitioner

## 2019-07-02 ENCOUNTER — Encounter (INDEPENDENT_AMBULATORY_CARE_PROVIDER_SITE_OTHER): Payer: Self-pay

## 2019-07-02 VITALS — BP 133/81 | HR 75 | Resp 16

## 2019-07-02 DIAGNOSIS — I890 Lymphedema, not elsewhere classified: Secondary | ICD-10-CM

## 2019-07-02 NOTE — Progress Notes (Signed)
History of Present Illness  There is no documented history at this time  Assessments & Plan   There are no diagnoses linked to this encounter.    Additional instructions  Subjective:  Patient presents with venous ulcer of the Bilateral lower extremity.    Procedure:  3 layer unna wrap was placed Bilateral lower extremity.   Plan:   Follow up in one week.  

## 2019-07-03 ENCOUNTER — Ambulatory Visit: Payer: Medicare Other | Admitting: Internal Medicine

## 2019-07-04 ENCOUNTER — Encounter: Payer: Self-pay | Admitting: Cardiology

## 2019-07-04 ENCOUNTER — Other Ambulatory Visit: Payer: Self-pay

## 2019-07-04 ENCOUNTER — Ambulatory Visit (INDEPENDENT_AMBULATORY_CARE_PROVIDER_SITE_OTHER): Payer: Medicare Other | Admitting: Cardiology

## 2019-07-04 VITALS — BP 118/68 | HR 67 | Ht 66.0 in | Wt 200.2 lb

## 2019-07-04 DIAGNOSIS — Z950 Presence of cardiac pacemaker: Secondary | ICD-10-CM | POA: Diagnosis not present

## 2019-07-04 DIAGNOSIS — R0600 Dyspnea, unspecified: Secondary | ICD-10-CM | POA: Diagnosis not present

## 2019-07-04 DIAGNOSIS — R0609 Other forms of dyspnea: Secondary | ICD-10-CM

## 2019-07-04 DIAGNOSIS — Z951 Presence of aortocoronary bypass graft: Secondary | ICD-10-CM

## 2019-07-04 MED ORDER — FUROSEMIDE 40 MG PO TABS: 40.0000 mg | ORAL_TABLET | Freq: Two times a day (BID) | ORAL | 3 refills | Status: DC

## 2019-07-04 NOTE — Progress Notes (Signed)
Cardiology Office Note:    Date:  07/04/2019   ID:  Kristi [name], DOB November 28, 1940, MRN [id number]  PCP:  Lorelee Market, MD  Cardiologist:  Kate Sable, MD  Electrophysiologist:  None   Referring MD: Lorelee Market, MD   Chief Complaint  Patient presents with  . OTHER    DOE c/o chest pain, sob and edema. Meds reviewed verbally with pt.    History of Present Illness:    Kristi [name] is a 79 y.o. female with a hx of diabetes, hypertension, hyperlipidemia, CAD, prior PCI x5 stents,CABGx3 over 15 years ago, permanent pacemaker, lymphedema who presents due to dyspnea on exertion.  Patient states having worsening dyspnea on exertion over the past 3 months.  She sleeps on a hospital bed at home and has required to place it on an incline.  She also endorses lower extremity edema for which she uses Ace wraps. Patient has a history of bilateral chronic venous insufficiency in the deep vein system of the lower extremity, and lymphedema, being seen at the vein clinic.  Has compression stockings and lymphedema pump device prescribed by specialist.  She states gaining about 40 pounds over the past 4 months.  She takes Lasix 40 mg daily and goes to the bathroom frequently but does not feel like she is getting out enough fluid.  She denies any symptoms of chest pain.  She states having an MI and prior stents totaling 5 before getting a three-vessel CABG. she would like to establish care with the device clinic for frequent pacemaker checks.  Echocardiogram on 11/2018 showed low normal ejection fraction with EF 50 to 55%, pseudonormal diastolic function.  Stress Myoview in 10/07/2016 was normal.  Past Medical History:  Diagnosis Date  . Anxiety   . Arthritis   . Broken arm    left FA 3/17  . Broken arm    left  . CAD (coronary artery disease)    s/p MI  . Cancer ([place])    uterine  . Cervical disc disorder   . Depression   . Diabetes mellitus without complication ([place])   . GERD  (gastroesophageal reflux disease)   . Headache   . Hyperlipidemia   . Hypertension   . Hypothyroid   . Myocardial infarction ([place])    15 year ago  . Neck pain 06/04/2014  . Parkinson's disease ([place])   . Presence of permanent cardiac pacemaker   . Spinal stenosis     Past Surgical History:  Procedure Laterality Date  . ABDOMINAL HYSTERECTOMY     partial  . BREAST SURGERY    . COLONOSCOPY    . COLONOSCOPY WITH PROPOFOL N/A 11/29/2017   Procedure: COLONOSCOPY WITH PROPOFOL;  Surgeon: [name], MD;  Location: [facility] ENDOSCOPY;  Service: Endoscopy;  Laterality: N/A;  . CORONARY ANGIOPLASTY     stent  . CORONARY ARTERY BYPASS GRAFT    . CORONARY STENT PLACEMENT    . ESOPHAGOGASTRODUODENOSCOPY (EGD) WITH PROPOFOL N/A 04/18/2017   Procedure: ESOPHAGOGASTRODUODENOSCOPY (EGD) WITH PROPOFOL;  Surgeon: [name], MD;  Location: [facility] ENDOSCOPY;  Service: Endoscopy;  Laterality: N/A;  . ESOPHAGOGASTRODUODENOSCOPY (EGD) WITH PROPOFOL N/A 11/29/2017   Procedure: ESOPHAGOGASTRODUODENOSCOPY (EGD) WITH PROPOFOL;  Surgeon: [name], MD;  Location: [facility] ENDOSCOPY;  Service: Endoscopy;  Laterality: N/A;  . INSERT / REPLACE / REMOVE PACEMAKER    . s/p pacer insertion    . TRANSANAL EXCISION OF RECTAL MASS N/A 01/17/2018   Procedure: TRANSANAL EXCISION OF RECTAL POLYP;  Surgeon: Bary Castilla,  Forest Gleason, MD;  Location: [facility] ORS;  Service: General;  Laterality: N/A;    Current Medications: Current Meds  Medication Sig  . aspirin EC 81 MG tablet Take 81 mg by mouth daily.  . cholestyramine (QUESTRAN) 4 g packet One packet daily in water  . donepezil (ARICEPT) 10 MG tablet Take 10 mg by mouth at bedtime.  . gabapentin (NEURONTIN) 300 MG capsule Take 300-600 mg by mouth See admin instructions. Take 300 mg by mouth and 600 mg at bedtime  . glimepiride (AMARYL) 1 MG tablet Take 1 tablet (1 mg total) by mouth daily with breakfast. (Patient taking differently: Take 2 mg by mouth daily. )  .  hydrocortisone 2.5 % ointment Apply topically 2 (two) times daily.  . hydrOXYzine (ATARAX/VISTARIL) 25 MG tablet Take 25 mg by mouth 2 (two) times daily.  [name] levothyroxine (SYNTHROID) 100 MCG tablet Take 100 mcg by mouth daily before breakfast.  . lidocaine (XYLOCAINE) 5 % ointment Apply 1 application topically as needed.  . loratadine (CLARITIN) 10 MG tablet Take by mouth.  . meclizine (ANTIVERT) 12.5 MG tablet Take 12.5 mg by mouth 2 (two) times daily as needed.   . Melatonin 5 MG CAPS Take by mouth.  . nitrofurantoin (MACRODANTIN) 100 MG capsule Take 100 mg by mouth at bedtime.  . nitroGLYCERIN (NITROSTAT) 0.4 MG SL tablet Place under the tongue.  [name] OLANZapine (ZYPREXA) 2.5 MG tablet Take 2.5 mg by mouth at bedtime.  . Omega-3 Fatty Acids (FISH OIL PO) Take by mouth daily.  [name] omeprazole (PRILOSEC) 40 MG capsule Take 40 mg by mouth 2 (two) times daily.  . potassium chloride SA (K-DUR,KLOR-CON) 20 MEQ tablet Take 20 mEq by mouth daily.   . rosuvastatin (CRESTOR) 40 MG tablet Take 40 mg by mouth daily.  . sertraline (ZOLOFT) 100 MG tablet Take 100 mg by mouth 2 (two) times daily.  [name] VITAMIN D PO Take by mouth daily.  [name] VITAMIN E PO Take by mouth daily.  . [DISCONTINUED] furosemide (LASIX) 40 MG tablet Take 40 mg by mouth daily.      Allergies:   Penicillins and Penicillin g   Social History   Socioeconomic History  . Marital status: Widowed    Spouse name: Not on file  . Number of children: Not on file  . Years of education: Not on file  . Highest education level: Not on file  Occupational History  . Occupation: retired  Tobacco Use  . Smoking status: Never Smoker  . Smokeless tobacco: Never Used  Substance and Sexual Activity  . Alcohol use: No  . Drug use: No  . Sexual activity: Never  Other Topics Concern  . Not on file  Social History Narrative  . Not on file   Social Determinants of Health   Financial Resource Strain:   . Difficulty of Paying Living Expenses:    Food Insecurity:   . Worried About Charity fundraiser in the Last Year:   . Arboriculturist in the Last Year:   Transportation Needs:   . Film/video editor (Medical):   [name] Lack of Transportation (Non-Medical):   Physical Activity:   . Days of Exercise per Week:   . Minutes of Exercise per Session:   Stress:   . Feeling of Stress :   Social Connections:   . Frequency of Communication with Friends and Family:   . Frequency of Social Gatherings with Friends and Family:   . Attends Religious Services:   .  Active Member of Clubs or Organizations:   . Attends Archivist Meetings:   [name] Marital Status:      Family History: The patient's family history includes Aneurysm in her mother; Cancer in her mother; Depression in her mother; Diabetes in her brother, brother, mother, and sister; Heart disease in her father; Hypertension in her mother.  ROS:   Please see the history of present illness.     All other systems reviewed and are negative.  EKGs/Labs/Other Studies Reviewed:    The following studies were reviewed today:   EKG:  EKG is  ordered today.  The ekg ordered today demonstrates V paced rhythm, heart rate 67 bpm  Recent Labs: 11/28/2018: ALT 18; BUN 20; Creatinine, Ser 0.94; Hemoglobin 12.8; Platelets 182; Potassium 3.8; Sodium 141  Recent Lipid Panel    Component Value Date/Time   CHOL 173 11/17/2016 0449   TRIG 94 11/17/2016 0449   HDL 69 11/17/2016 0449   CHOLHDL 2.5 11/17/2016 0449   VLDL 19 11/17/2016 0449   LDLCALC 85 11/17/2016 0449    Physical Exam:    VS:  BP 118/68 (BP Location: Right Arm, Patient Position: Sitting, Cuff Size: Normal)   Pulse 67   Ht 5\' 6"  (1.676 m)   Wt 200 lb 4 oz (90.8 kg)   SpO2 98%   BMI 32.32 kg/m     Wt Readings from Last 3 Encounters:  07/04/19 200 lb 4 oz (90.8 kg)  06/25/19 202 lb (91.6 kg)  04/15/19 194 lb (88 kg)     GEN:  Well nourished, well developed in no acute distress HEENT: Normal NECK: No JVD; No  carotid bruits LYMPHATICS: No lymphadenopathy CARDIAC: RRR, no murmurs, rubs, gallops RESPIRATORY:  Clear to auscultation without rales, wheezing or rhonchi  ABDOMEN: Soft, non-tender, non-distended MUSCULOSKELETAL: Lower extremity wrapped with Ace wraps.  Could not assess edema SKIN: Warm and dry NEUROLOGIC:  Alert and oriented x 3 PSYCHIATRIC:  Normal affect   ASSESSMENT:    1. Dyspnea on exertion   2. Hx of CABG   3. Pacemaker    PLAN:    In order of problems listed above:  1. Patient with worsening dyspnea on exertion.  She has lower extremity edema and also weight gain.  Will evaluate cardiac function with echocardiogram.  Last echo showed diastolic dysfunction.  Patient has gained 40 pounds over the past 3 months.  Increase Lasix dose to 40 mg twice daily.  She is high risk due to history of CABG.  Symptoms of shortness of breath with exertion could be an angina- equivalent.  Get Lexiscan Myoview to evaluate ischemia. 2. History of CAD/CABG.  Continue aspirin 81 mg, Crestor 40 mg. 3. History of permanent pacemaker.  EKG shows V paced rhythm.  Establish appointment with device clinic for frequent pacemaker checks.  Follow up after echo and Myoview.  This note was generated in part or whole with voice recognition software. Voice recognition is usually quite accurate but there are transcription errors that can and very often do occur. I apologize for any typographical errors that were not detected and corrected.  Medication Adjustments/Labs and Tests Ordered: Current medicines are reviewed at length with the patient today.  Concerns regarding medicines are outlined above.  Orders Placed This Encounter  Procedures  . NM Myocar Multi W/Spect W/Wall Motion / EF  . EKG 12-Lead  . ECHOCARDIOGRAM COMPLETE   Meds ordered this encounter  Medications  . furosemide (LASIX) 40 MG tablet  Sig: Take 1 tablet (40 mg total) by mouth 2 (two) times daily.    Dispense:  180 tablet     Refill:  3    Patient Instructions  Medication Instructions:  Your physician has recommended you make the following change in your medication:  1. INCREASE Furosemide 40 mg twice a day  *If you need a refill on your cardiac medications before your next appointment, please call your pharmacy*   Lab Work: None  If you have labs (blood work) drawn today and your tests are completely normal, you will receive your results only by: [name] MyChart Message (if you have MyChart) OR . A paper copy in the mail If you have any lab test that is abnormal or we need to change your treatment, we will call you to review the results.   Testing/Procedures: Your physician has requested that you have an echocardiogram. Echocardiography is a painless test that uses sound waves to create images of your heart. It provides your doctor with information about the size and shape of your heart and how well your heart's chambers and valves are working. This procedure takes approximately one hour. There are no restrictions for this procedure.  [place]  Your caregiver has ordered a Stress Test with nuclear imaging. The purpose of this test is to evaluate the blood supply to your heart muscle. This procedure is referred to as a "Non-Invasive Stress Test." This is because other than having an IV started in your vein, nothing is inserted or "invades" your body. Cardiac stress tests are done to find areas of poor blood flow to the heart by determining the extent of coronary artery disease (CAD). Some patients exercise on a treadmill, which naturally increases the blood flow to your heart, while others who are  unable to walk on a treadmill due to physical limitations have a pharmacologic/chemical stress agent called Lexiscan . This medicine will mimic walking on a treadmill by temporarily increasing your coronary blood flow.   Please note: these test may take anywhere between 2-4 hours to complete  PLEASE REPORT TO [place] AT THE FIRST DESK WILL DIRECT YOU WHERE TO GO  Date of Procedure:_____________________________________  Arrival Time for Procedure:______________________________  Instructions regarding medication:   _XX___:  Hold Furosemide (Lasix) and Glimepiride (Amaryl) the morning of your procedure.   PLEASE NOTIFY THE OFFICE AT LEAST 42 HOURS IN ADVANCE IF YOU ARE UNABLE TO KEEP YOUR APPOINTMENT.  [phone] AND  PLEASE NOTIFY NUCLEAR MEDICINE AT [facility] AT LEAST 24 HOURS IN ADVANCE IF YOU ARE UNABLE TO KEEP YOUR APPOINTMENT. [phone]  How to prepare for your Myoview test:  1. Do not eat or drink after midnight 2. No caffeine for 24 hours prior to test 3. No smoking 24 hours prior to test. 4. Your medication may be taken with water.  If your doctor stopped a medication because of this test, do not take that medication. 5. Ladies, please do not wear dresses.  Skirts or pants are appropriate. Please wear a short sleeve shirt. 6. No perfume, cologne or lotion. 7. Wear comfortable walking shoes. No heels!   Follow-Up: At [facility], you and your health needs are our priority.  As part of our continuing mission to provide you with exceptional heart care, we have created designated Provider Care Teams.  These Care Teams include your primary Cardiologist (physician) and Advanced Practice Providers (APPs -  Physician Assistants and Nurse Practitioners) who all work together to  provide you with the care you need, when you need it.  We recommend signing up for the patient portal called "MyChart".  Sign up information is provided on this After Visit Summary.  MyChart is used to connect with patients for Virtual Visits (Telemedicine).  Patients are able to view lab/test results, encounter notes, upcoming appointments, etc.  Non-urgent messages can be sent to your provider as well.   To learn more about what you can do with MyChart, go to [email or web address].     Your next appointment:    Follow up with Dr. Garen Lah after testing.  Schedule appointment with [facility].       Signed, Kate Sable, MD  07/04/2019 1:26 PM    [place]

## 2019-07-04 NOTE — Patient Instructions (Addendum)
Medication Instructions:  Your physician has recommended you make the following change in your medication:  1. INCREASE Furosemide 40 mg twice a day  *If you need a refill on your cardiac medications before your next appointment, please call your pharmacy*   Lab Work: None  If you have labs (blood work) drawn today and your tests are completely normal, you will receive your results only by: [name] MyChart Message (if you have MyChart) OR . A paper copy in the mail If you have any lab test that is abnormal or we need to change your treatment, we will call you to review the results.   Testing/Procedures: Your physician has requested that you have an echocardiogram. Echocardiography is a painless test that uses sound waves to create images of your heart. It provides your doctor with information about the size and shape of your heart and how well your heart's chambers and valves are working. This procedure takes approximately one hour. There are no restrictions for this procedure.  [place]  Your caregiver has ordered a Stress Test with nuclear imaging. The purpose of this test is to evaluate the blood supply to your heart muscle. This procedure is referred to as a "Non-Invasive Stress Test." This is because other than having an IV started in your vein, nothing is inserted or "invades" your body. Cardiac stress tests are done to find areas of poor blood flow to the heart by determining the extent of coronary artery disease (CAD). Some patients exercise on a treadmill, which naturally increases the blood flow to your heart, while others who are  unable to walk on a treadmill due to physical limitations have a pharmacologic/chemical stress agent called Lexiscan . This medicine will mimic walking on a treadmill by temporarily increasing your coronary blood flow.   Please note: these test may take anywhere between 2-4 hours to complete  PLEASE REPORT TO [place] AT  THE FIRST DESK WILL DIRECT YOU WHERE TO GO  Date of Procedure:_____________________________________  Arrival Time for Procedure:______________________________  Instructions regarding medication:   _XX___:  Hold Furosemide (Lasix) and Glimepiride (Amaryl) the morning of your procedure.   PLEASE NOTIFY THE OFFICE AT LEAST 83 HOURS IN ADVANCE IF YOU ARE UNABLE TO KEEP YOUR APPOINTMENT.  [phone] AND  PLEASE NOTIFY NUCLEAR MEDICINE AT [facility] AT LEAST 24 HOURS IN ADVANCE IF YOU ARE UNABLE TO KEEP YOUR APPOINTMENT. [phone]  How to prepare for your Myoview test:  1. Do not eat or drink after midnight 2. No caffeine for 24 hours prior to test 3. No smoking 24 hours prior to test. 4. Your medication may be taken with water.  If your doctor stopped a medication because of this test, do not take that medication. 5. Ladies, please do not wear dresses.  Skirts or pants are appropriate. Please wear a short sleeve shirt. 6. No perfume, cologne or lotion. 7. Wear comfortable walking shoes. No heels!   Follow-Up: At [facility], you and your health needs are our priority.  As part of our continuing mission to provide you with exceptional heart care, we have created designated Provider Care Teams.  These Care Teams include your primary Cardiologist (physician) and Advanced Practice Providers (APPs -  Physician Assistants and Nurse Practitioners) who all work together to provide you with the care you need, when you need it.  We recommend signing up for the patient portal called "MyChart".  Sign up information is provided on this After Visit Summary.  MyChart is used to connect with patients for Virtual Visits (Telemedicine).  Patients are able to view lab/test results, encounter notes, upcoming appointments, etc.  Non-urgent messages can be sent to your provider as well.   To learn more about what you can do with MyChart, go to [email or web address].    Your next appointment:    Follow up  with Dr. Garen Lah after testing.  Schedule appointment with [facility].

## 2019-07-05 ENCOUNTER — Ambulatory Visit (INDEPENDENT_AMBULATORY_CARE_PROVIDER_SITE_OTHER): Payer: Medicare Other | Admitting: Nurse Practitioner

## 2019-07-05 ENCOUNTER — Encounter (INDEPENDENT_AMBULATORY_CARE_PROVIDER_SITE_OTHER): Payer: Medicare Other

## 2019-07-09 ENCOUNTER — Other Ambulatory Visit: Payer: Self-pay

## 2019-07-09 ENCOUNTER — Ambulatory Visit: Payer: Medicare Other | Admitting: Podiatry

## 2019-07-09 ENCOUNTER — Encounter (INDEPENDENT_AMBULATORY_CARE_PROVIDER_SITE_OTHER): Payer: Self-pay

## 2019-07-09 ENCOUNTER — Ambulatory Visit (INDEPENDENT_AMBULATORY_CARE_PROVIDER_SITE_OTHER): Payer: Medicare Other | Admitting: Nurse Practitioner

## 2019-07-09 VITALS — BP 132/76 | HR 76 | Resp 16 | Wt 201.0 lb

## 2019-07-09 DIAGNOSIS — I890 Lymphedema, not elsewhere classified: Secondary | ICD-10-CM

## 2019-07-09 NOTE — Progress Notes (Signed)
History of Present Illness  There is no documented history at this time  Assessments & Plan   There are no diagnoses linked to this encounter.    Additional instructions  Subjective:  Patient presents with venous ulcer of the Bilateral lower extremity.    Procedure:  3 layer unna wrap was placed Bilateral lower extremity.   Plan:   Follow up in one week.  

## 2019-07-16 ENCOUNTER — Ambulatory Visit (INDEPENDENT_AMBULATORY_CARE_PROVIDER_SITE_OTHER): Payer: Medicare Other | Admitting: Nurse Practitioner

## 2019-07-16 ENCOUNTER — Other Ambulatory Visit: Payer: Self-pay

## 2019-07-16 VITALS — BP 136/77 | HR 86 | Resp 16 | Ht 66.0 in | Wt 200.0 lb

## 2019-07-16 DIAGNOSIS — I890 Lymphedema, not elsewhere classified: Secondary | ICD-10-CM

## 2019-07-16 NOTE — Progress Notes (Signed)
History of Present Illness  There is no documented history at this time  Assessments & Plan   There are no diagnoses linked to this encounter.    Additional instructions  Subjective:  Patient presents with venous ulcer of the Bilateral lower extremity.    Procedure:  3 layer unna wrap was placed Bilateral lower extremity.   Plan:   Follow up in one week.  

## 2019-07-19 ENCOUNTER — Encounter (INDEPENDENT_AMBULATORY_CARE_PROVIDER_SITE_OTHER): Payer: Self-pay | Admitting: Nurse Practitioner

## 2019-07-22 ENCOUNTER — Other Ambulatory Visit: Payer: Self-pay

## 2019-07-22 ENCOUNTER — Ambulatory Visit
Admission: RE | Admit: 2019-07-22 | Discharge: 2019-07-22 | Disposition: A | Discharge status: Home / Self Care | Payer: Medicare Other | Source: Ambulatory Visit | Attending: Cardiology | Admitting: Cardiology

## 2019-07-22 DIAGNOSIS — R0600 Dyspnea, unspecified: Secondary | ICD-10-CM | POA: Diagnosis not present

## 2019-07-22 DIAGNOSIS — R0609 Other forms of dyspnea: Secondary | ICD-10-CM

## 2019-07-22 LAB — NM MYOCAR MULTI W/SPECT W/WALL MOTION / EF
LV dias vol: 103 mL (ref 46–106)
LV sys vol: 46 mL
Peak HR: 72 {beats}/min
Percent HR: 51 %
Rest HR: 72 {beats}/min
SDS: 1
SRS: 13
SSS: 8
TID: 1.04

## 2019-07-22 MED ORDER — TECHNETIUM TC 99M TETROFOSMIN IV KIT
11.1500 | PACK | Freq: Once | INTRAVENOUS | Status: AC | PRN
  Administered 2019-07-22: 11.15 via INTRAVENOUS

## 2019-07-22 MED ORDER — REGADENOSON 0.4 MG/5ML IV SOLN
0.4000 mg | Freq: Once | INTRAVENOUS | Status: AC
  Administered 2019-07-22: 0.4 mg via INTRAVENOUS

## 2019-07-22 MED ORDER — TECHNETIUM TC 99M TETROFOSMIN IV KIT
32.3700 | PACK | Freq: Once | INTRAVENOUS | Status: AC | PRN
  Administered 2019-07-22: 10:00:00 32.37 via INTRAVENOUS

## 2019-07-23 ENCOUNTER — Ambulatory Visit (INDEPENDENT_AMBULATORY_CARE_PROVIDER_SITE_OTHER): Payer: Medicare Other | Admitting: Nurse Practitioner

## 2019-07-23 ENCOUNTER — Encounter (INDEPENDENT_AMBULATORY_CARE_PROVIDER_SITE_OTHER): Payer: Self-pay

## 2019-07-23 VITALS — BP 133/79 | HR 83 | Resp 16 | Wt 202.8 lb

## 2019-07-23 DIAGNOSIS — I890 Lymphedema, not elsewhere classified: Secondary | ICD-10-CM | POA: Diagnosis not present

## 2019-07-23 NOTE — Progress Notes (Signed)
History of Present Illness  There is no documented history at this time  Assessments & Plan   There are no diagnoses linked to this encounter.    Additional instructions  Subjective:  Patient presents with venous ulcer of the Bilateral lower extremity.    Procedure:  3 layer unna wrap was placed Bilateral lower extremity.   Plan:   Follow up in one week.  

## 2019-07-24 ENCOUNTER — Encounter (INDEPENDENT_AMBULATORY_CARE_PROVIDER_SITE_OTHER): Payer: Self-pay | Admitting: Nurse Practitioner

## 2019-07-30 ENCOUNTER — Ambulatory Visit (INDEPENDENT_AMBULATORY_CARE_PROVIDER_SITE_OTHER): Payer: Medicare Other | Admitting: Nurse Practitioner

## 2019-07-30 ENCOUNTER — Encounter (INDEPENDENT_AMBULATORY_CARE_PROVIDER_SITE_OTHER): Payer: Self-pay

## 2019-07-30 ENCOUNTER — Other Ambulatory Visit: Payer: Self-pay

## 2019-07-30 VITALS — BP 127/71 | HR 82 | Resp 16 | Wt 208.0 lb

## 2019-07-30 DIAGNOSIS — I890 Lymphedema, not elsewhere classified: Secondary | ICD-10-CM

## 2019-07-30 NOTE — Progress Notes (Signed)
History of Present Illness  There is no documented history at this time  Assessments & Plan   There are no diagnoses linked to this encounter.    Additional instructions  Subjective:  Patient presents with venous ulcer of the Bilateral lower extremity.    Procedure:  3 layer unna wrap was placed Bilateral lower extremity.   Plan:   Follow up in one week.  

## 2019-07-31 ENCOUNTER — Encounter (INDEPENDENT_AMBULATORY_CARE_PROVIDER_SITE_OTHER): Payer: Self-pay | Admitting: Nurse Practitioner

## 2019-08-05 ENCOUNTER — Ambulatory Visit (INDEPENDENT_AMBULATORY_CARE_PROVIDER_SITE_OTHER): Payer: Medicare Other | Admitting: Nurse Practitioner

## 2019-08-05 ENCOUNTER — Other Ambulatory Visit: Payer: Self-pay

## 2019-08-05 ENCOUNTER — Encounter (INDEPENDENT_AMBULATORY_CARE_PROVIDER_SITE_OTHER): Payer: Self-pay | Admitting: Nurse Practitioner

## 2019-08-05 VITALS — BP 143/83 | HR 89 | Ht 66.0 in | Wt 207.0 lb

## 2019-08-05 DIAGNOSIS — I890 Lymphedema, not elsewhere classified: Secondary | ICD-10-CM

## 2019-08-05 DIAGNOSIS — F03A0 Unspecified dementia, mild, without behavioral disturbance, psychotic disturbance, mood disturbance, and anxiety: Secondary | ICD-10-CM

## 2019-08-05 DIAGNOSIS — I10 Essential (primary) hypertension: Secondary | ICD-10-CM | POA: Diagnosis not present

## 2019-08-05 DIAGNOSIS — F0390 Unspecified dementia without behavioral disturbance: Secondary | ICD-10-CM

## 2019-08-05 NOTE — Progress Notes (Signed)
Subjective:    Patient ID: Kristi [name], female    DOB: Jan 23, 1941, 79 y.o.   MRN: [id number] Chief Complaint  Patient presents with  . Follow-up    5 week unna check    Patient presents today for evaluation of her bilateral lower extremity edema and discomfort.  She states that the bilateral lower extremity edema has been much better with Unna wraps.  Patient recently saw her cardiologist and had some medication adjustments and that has helped her swelling as well.  The patient states that her previous shortness of breath with exertion is getting better in addition to her fatigue.  She still continues to deny any fever, chills, nausea, vomiting or diarrhea.  She states that with the interact she has been able to be more active.  She still continues to utilize elevation and her lymphedema pump nightly.     Review of Systems  Respiratory: Positive for shortness of breath.   Cardiovascular: Positive for leg swelling.  Psychiatric/Behavioral: Positive for confusion.  All other systems reviewed and are negative.      Objective:   Physical Exam Vitals reviewed.  HENT:     Head: Normocephalic.  Cardiovascular:     Rate and Rhythm: Normal rate and regular rhythm.     Pulses: Normal pulses.     Heart sounds: Normal heart sounds.  Pulmonary:     Effort: Pulmonary effort is normal.     Breath sounds: Normal breath sounds.  Musculoskeletal:     Right lower leg: 1+ Pitting Edema present.     Left lower leg: 1+ Pitting Edema present.  Neurological:     Mental Status: She is alert. Mental status is at baseline.  Psychiatric:        Attention and Perception: Attention normal.        Cognition and Memory: Memory is impaired.     BP (!) 143/83   Pulse 89   Ht 5\' 6"  (1.676 m)   Wt 207 lb (93.9 kg)   BMI 33.41 kg/m   Past Medical History:  Diagnosis Date  . Anxiety   . Arthritis   . Broken arm    left FA 3/17  . Broken arm    left  . CAD (coronary artery disease)    s/p MI   . Cancer ([place])    uterine  . Cervical disc disorder   . Depression   . Diabetes mellitus without complication ([place])   . GERD (gastroesophageal reflux disease)   . Headache   . Hyperlipidemia   . Hypertension   . Hypothyroid   . Myocardial infarction ([place])    15 year ago  . Neck pain 06/04/2014  . Parkinson's disease ([place])   . Presence of permanent cardiac pacemaker   . Spinal stenosis     Social History   Socioeconomic History  . Marital status: Widowed    Spouse name: Not on file  . Number of children: Not on file  . Years of education: Not on file  . Highest education level: Not on file  Occupational History  . Occupation: retired  Tobacco Use  . Smoking status: Never Smoker  . Smokeless tobacco: Never Used  Substance and Sexual Activity  . Alcohol use: No  . Drug use: No  . Sexual activity: Never  Other Topics Concern  . Not on file  Social History Narrative  . Not on file   Social Determinants of Health   Financial Resource Strain:   . Difficulty  of Paying Living Expenses:   Food Insecurity:   . Worried About Charity fundraiser in the Last Year:   . Arboriculturist in the Last Year:   Transportation Needs:   . Film/video editor (Medical):   [name] Lack of Transportation (Non-Medical):   Physical Activity:   . Days of Exercise per Week:   . Minutes of Exercise per Session:   Stress:   . Feeling of Stress :   Social Connections:   . Frequency of Communication with Friends and Family:   . Frequency of Social Gatherings with Friends and Family:   . Attends Religious Services:   . Active Member of Clubs or Organizations:   . Attends Archivist Meetings:   [name] Marital Status:   Intimate Partner Violence:   . Fear of Current or Ex-Partner:   . Emotionally Abused:   [name] Physically Abused:   . Sexually Abused:     Past Surgical History:  Procedure Laterality Date  . ABDOMINAL HYSTERECTOMY     partial  . BREAST SURGERY    . COLONOSCOPY    .  COLONOSCOPY WITH PROPOFOL N/A 11/29/2017   Procedure: COLONOSCOPY WITH PROPOFOL;  Surgeon: [name], MD;  Location: [facility] ENDOSCOPY;  Service: Endoscopy;  Laterality: N/A;  . CORONARY ANGIOPLASTY     stent  . CORONARY ARTERY BYPASS GRAFT    . CORONARY STENT PLACEMENT    . ESOPHAGOGASTRODUODENOSCOPY (EGD) WITH PROPOFOL N/A 04/18/2017   Procedure: ESOPHAGOGASTRODUODENOSCOPY (EGD) WITH PROPOFOL;  Surgeon: [name], MD;  Location: [facility] ENDOSCOPY;  Service: Endoscopy;  Laterality: N/A;  . ESOPHAGOGASTRODUODENOSCOPY (EGD) WITH PROPOFOL N/A 11/29/2017   Procedure: ESOPHAGOGASTRODUODENOSCOPY (EGD) WITH PROPOFOL;  Surgeon: [name], MD;  Location: [facility] ENDOSCOPY;  Service: Endoscopy;  Laterality: N/A;  . INSERT / REPLACE / REMOVE PACEMAKER    . s/p pacer insertion    . TRANSANAL EXCISION OF RECTAL MASS N/A 01/17/2018   Procedure: TRANSANAL EXCISION OF RECTAL POLYP;  Surgeon: [name], MD;  Location: [facility] ORS;  Service: General;  Laterality: N/A;    Family History  Problem Relation Age of Onset  . Cancer Mother   . Depression Mother   . Diabetes Mother   . Hypertension Mother   . Aneurysm Mother   . Heart disease Father   . Diabetes Sister   . Diabetes Brother   . Diabetes Brother     Allergies  Allergen Reactions  . Penicillins Anaphylaxis  . Penicillin G Swelling and Rash    Has patient had a PCN reaction causing immediate rash, facial/tongue/throat swelling, SOB or lightheadedness with hypotension: Yes Has patient had a PCN reaction causing severe rash involving mucus membranes or skin necrosis: No Has patient had a PCN reaction that required hospitalization: No Has patient had a PCN reaction occurring within the last 10 years: No If all of the above answers are "NO", then may proceed with Cephalosporin use.        Assessment & Plan:   1. Lymphedema We will take patient out of Unna wraps today.  The patient is reminded to continue with conservative  therapy including use of medical grade 1 compression stockings, elevation of her lower extremities and exercise.  The patient has also had a change in her cardiac medications which also may be helpful.  She also has several follow-ups coming with her cardiologist.  The patient is reminded to continue to utilize her lymphedema pump for an hour on a twice daily  basis.  The patient will return to the office in 3 months to follow-up to see how she is progressing with noninvasive therapy.  However if the patient has issues prior to her follow-up visit she is urged to contact her office and if necessary we we will place the patient back in milligrams.  2. Essential hypertension Continue antihypertensive medications as already ordered, these medications have been reviewed and there are no changes at this time.   3. Mild dementia ([place]) This will make adhering to conservative therapy somewhat difficult.  Patient given written instructions regarding compression, elevation lymphedema pump usage.  Patient will continue with current medications.   Current Outpatient Medications on File Prior to Visit  Medication Sig Dispense Refill  . aspirin EC 81 MG tablet Take 81 mg by mouth daily.    . cholestyramine (QUESTRAN) 4 g packet One packet daily in water 30 each 11  . donepezil (ARICEPT) 10 MG tablet Take 10 mg by mouth at bedtime.    . furosemide (LASIX) 40 MG tablet Take 1 tablet (40 mg total) by mouth 2 (two) times daily. 180 tablet 3  . gabapentin (NEURONTIN) 300 MG capsule Take 300-600 mg by mouth See admin instructions. Take 300 mg by mouth and 600 mg at bedtime    . glimepiride (AMARYL) 1 MG tablet Take 1 tablet (1 mg total) by mouth daily with breakfast. (Patient taking differently: Take 2 mg by mouth daily. ) 30 tablet 2  . hydrocortisone 2.5 % ointment Apply topically 2 (two) times daily. 30 g 0  . hydrOXYzine (ATARAX/VISTARIL) 25 MG tablet Take 25 mg by mouth 2 (two) times daily.    [name] levothyroxine  (SYNTHROID) 100 MCG tablet Take 100 mcg by mouth daily before breakfast.    . lidocaine (XYLOCAINE) 5 % ointment Apply 1 application topically as needed.    . loratadine (CLARITIN) 10 MG tablet Take by mouth.    . montelukast (SINGULAIR) 10 MG tablet     . nitrofurantoin (MACRODANTIN) 100 MG capsule Take 100 mg by mouth at bedtime.    . nitroGLYCERIN (NITROSTAT) 0.4 MG SL tablet Place under the tongue.    [name] OLANZapine (ZYPREXA) 2.5 MG tablet Take 2.5 mg by mouth at bedtime.    . Omega-3 Fatty Acids (FISH OIL PO) Take by mouth daily.    [name] omeprazole (PRILOSEC) 40 MG capsule Take 40 mg by mouth 2 (two) times daily.    . pioglitazone (ACTOS) 30 MG tablet     . potassium chloride SA (K-DUR,KLOR-CON) 20 MEQ tablet Take 20 mEq by mouth daily.     . rosuvastatin (CRESTOR) 40 MG tablet Take 40 mg by mouth daily.    . sertraline (ZOLOFT) 100 MG tablet Take 100 mg by mouth 2 (two) times daily.    [name] VITAMIN D PO Take by mouth daily.    [name] VITAMIN E PO Take by mouth daily.    . meclizine (ANTIVERT) 12.5 MG tablet Take 12.5 mg by mouth 2 (two) times daily as needed.     . Melatonin 5 MG CAPS Take by mouth.     No current facility-administered medications on file prior to visit.    There are no Patient Instructions on file for this visit. No follow-ups on file.   Kris Hartmann, NP

## 2019-08-08 ENCOUNTER — Other Ambulatory Visit: Payer: Self-pay | Admitting: General Surgery

## 2019-08-08 DIAGNOSIS — K625 Hemorrhage of anus and rectum: Secondary | ICD-10-CM

## 2019-08-08 DIAGNOSIS — R1900 Intra-abdominal and pelvic swelling, mass and lump, unspecified site: Secondary | ICD-10-CM

## 2019-08-08 DIAGNOSIS — R140 Abdominal distension (gaseous): Secondary | ICD-10-CM

## 2019-08-09 ENCOUNTER — Telehealth: Payer: Self-pay | Admitting: Cardiology

## 2019-08-09 ENCOUNTER — Telehealth: Payer: Self-pay | Admitting: *Deleted

## 2019-08-09 NOTE — Telephone Encounter (Signed)
Accessed pt's chart to see who called. Transferred the call over to [facility].

## 2019-08-09 NOTE — Telephone Encounter (Signed)
LMOVM for patient and daughter, [name] [facility]) requesting call back to [place] to discuss upcoming appointment. Direct number and office hours provided.  Patient called back and reports she thinks she has a [employer] PPM. She is aware of appointment time and location. Pt denies additional questions at this time.

## 2019-08-12 ENCOUNTER — Ambulatory Visit (INDEPENDENT_AMBULATORY_CARE_PROVIDER_SITE_OTHER): Payer: Medicare Other

## 2019-08-12 ENCOUNTER — Other Ambulatory Visit: Payer: Self-pay

## 2019-08-12 DIAGNOSIS — R0600 Dyspnea, unspecified: Secondary | ICD-10-CM | POA: Diagnosis not present

## 2019-08-12 DIAGNOSIS — R0609 Other forms of dyspnea: Secondary | ICD-10-CM

## 2019-08-13 ENCOUNTER — Encounter: Payer: Self-pay | Admitting: Internal Medicine

## 2019-08-13 ENCOUNTER — Encounter: Payer: Self-pay | Admitting: *Deleted

## 2019-08-13 ENCOUNTER — Ambulatory Visit (INDEPENDENT_AMBULATORY_CARE_PROVIDER_SITE_OTHER): Payer: Medicare Other | Admitting: Internal Medicine

## 2019-08-13 VITALS — BP 120/76 | HR 77 | Ht 66.0 in | Wt 206.2 lb

## 2019-08-13 DIAGNOSIS — Z950 Presence of cardiac pacemaker: Secondary | ICD-10-CM | POA: Diagnosis not present

## 2019-08-13 DIAGNOSIS — R0600 Dyspnea, unspecified: Secondary | ICD-10-CM | POA: Diagnosis not present

## 2019-08-13 DIAGNOSIS — R0609 Other forms of dyspnea: Secondary | ICD-10-CM

## 2019-08-13 DIAGNOSIS — I495 Sick sinus syndrome: Secondary | ICD-10-CM | POA: Diagnosis not present

## 2019-08-13 MED ORDER — TORSEMIDE 20 MG PO TABS: ORAL_TABLET | ORAL | 6 refills | Status: DC

## 2019-08-13 NOTE — Progress Notes (Signed)
ELECTROPHYSIOLOGY CONSULT NOTE  Patient ID: Kristi [name], MRN: [id number], DOB/AGE: 81-Aug-1942 81 y.o. Admit date: (Not on file) Date of Consult: 08/13/2019  Primary Physician: Lorelee Market, MD Primary Cardiologist: BAE     Kristi [name] is a 79 y.o. female who is being seen today for the evaluation of [pacer at the request of BAE.    HPI Kristi [name] is a 79 y.o. female history of a pacemaker implanted by Dr. Kermit Balo for reasons of which she is aware.  Her device was implanted in 2008 and underwent generator replacement 2014.  There are no records in care everywhere further clarified.  She complains of shortness of breath.  With peripheral edema.  No chest pain.  Hx of CAD with prior CABG  and interval stenting  She lives by herself despite her dementia.  Diet is salt restricted but not deplete.  Fluid is restricted also but not deplete.  No syncope or palpitations.  DATE TEST EF   7/18 Myoview  No ischemia ( by report)   1/19 Echo   65 %   5/21 Echo   50-55 % LAE-severe(4.54mm///68 ml/m2)) Pulm Htn PA >56  5/21 Myoview 55-65% Perfusion defect  Artifact v scar    Date Cr K TSH Hgb  9/20 1.1 3.9  12.4             Past Medical History:  Diagnosis Date  . Anxiety   . Arthritis   . Broken arm    left FA 3/17  . Broken arm    left  . CAD (coronary artery disease)    s/p MI  . Cancer ([place])    uterine  . Cervical disc disorder   . Depression   . Diabetes mellitus without complication ([place])   . GERD (gastroesophageal reflux disease)   . Headache   . Hyperlipidemia   . Hypertension   . Hypothyroid   . Myocardial infarction ([place])    15 year ago  . Neck pain 06/04/2014  . Parkinson's disease ([place])   . Presence of permanent cardiac pacemaker   . Spinal stenosis       Surgical History:  Past Surgical History:  Procedure Laterality Date  . ABDOMINAL HYSTERECTOMY     partial  . BREAST SURGERY    . COLONOSCOPY    . COLONOSCOPY WITH PROPOFOL N/A  11/29/2017   Procedure: COLONOSCOPY WITH PROPOFOL;  Surgeon: [name], MD;  Location: [facility] ENDOSCOPY;  Service: Endoscopy;  Laterality: N/A;  . CORONARY ANGIOPLASTY     stent  . CORONARY ARTERY BYPASS GRAFT    . CORONARY STENT PLACEMENT    . ESOPHAGOGASTRODUODENOSCOPY (EGD) WITH PROPOFOL N/A 04/18/2017   Procedure: ESOPHAGOGASTRODUODENOSCOPY (EGD) WITH PROPOFOL;  Surgeon: [name], MD;  Location: [facility] ENDOSCOPY;  Service: Endoscopy;  Laterality: N/A;  . ESOPHAGOGASTRODUODENOSCOPY (EGD) WITH PROPOFOL N/A 11/29/2017   Procedure: ESOPHAGOGASTRODUODENOSCOPY (EGD) WITH PROPOFOL;  Surgeon: [name], MD;  Location: [facility] ENDOSCOPY;  Service: Endoscopy;  Laterality: N/A;  . INSERT / REPLACE / REMOVE PACEMAKER    . s/p pacer insertion    . TRANSANAL EXCISION OF RECTAL MASS N/A 01/17/2018   Procedure: TRANSANAL EXCISION OF RECTAL POLYP;  Surgeon: [name], MD;  Location: [facility] ORS;  Service: General;  Laterality: N/A;     Home Meds: Current Meds  Medication Sig  . aspirin EC 81 MG tablet Take 81 mg by mouth daily.  . cholestyramine (QUESTRAN) 4 g packet One packet daily in water  .  donepezil (ARICEPT) 10 MG tablet Take 10 mg by mouth at bedtime.  . furosemide (LASIX) 40 MG tablet Take 1 tablet (40 mg total) by mouth 2 (two) times daily.  [name] gabapentin (NEURONTIN) 300 MG capsule Take 300-600 mg by mouth See admin instructions. Take 300 mg by mouth and 600 mg at bedtime  . glimepiride (AMARYL) 1 MG tablet Take 1 tablet (1 mg total) by mouth daily with breakfast. (Patient taking differently: Take 2 mg by mouth daily. )  . hydrocortisone 2.5 % ointment Apply topically 2 (two) times daily.  . hydrOXYzine (ATARAX/VISTARIL) 25 MG tablet Take 25 mg by mouth 2 (two) times daily.  [name] levothyroxine (SYNTHROID) 100 MCG tablet Take 100 mcg by mouth daily before breakfast.  . lidocaine (XYLOCAINE) 5 % ointment Apply 1 application topically as needed.  . loratadine (CLARITIN) 10 MG  tablet Take by mouth.  . meclizine (ANTIVERT) 12.5 MG tablet Take 12.5 mg by mouth 2 (two) times daily as needed.   . Melatonin 5 MG CAPS Take by mouth.  . montelukast (SINGULAIR) 10 MG tablet   . nitrofurantoin (MACRODANTIN) 100 MG capsule Take 100 mg by mouth at bedtime.  . nitroGLYCERIN (NITROSTAT) 0.4 MG SL tablet Place under the tongue.  [name] OLANZapine (ZYPREXA) 2.5 MG tablet Take 2.5 mg by mouth at bedtime.  . Omega-3 Fatty Acids (FISH OIL PO) Take by mouth daily.  [name] omeprazole (PRILOSEC) 40 MG capsule Take 40 mg by mouth 2 (two) times daily.  . pioglitazone (ACTOS) 30 MG tablet   . potassium chloride SA (K-DUR,KLOR-CON) 20 MEQ tablet Take 20 mEq by mouth daily.   . rosuvastatin (CRESTOR) 40 MG tablet Take 40 mg by mouth daily.  . sertraline (ZOLOFT) 100 MG tablet Take 100 mg by mouth 2 (two) times daily.  [name] VITAMIN D PO Take by mouth daily.  [name] VITAMIN E PO Take by mouth daily.    Allergies:  Allergies  Allergen Reactions  . Penicillins Anaphylaxis  . Penicillin G Swelling and Rash    Has patient had a PCN reaction causing immediate rash, facial/tongue/throat swelling, SOB or lightheadedness with hypotension: Yes Has patient had a PCN reaction causing severe rash involving mucus membranes or skin necrosis: No Has patient had a PCN reaction that required hospitalization: No Has patient had a PCN reaction occurring within the last 10 years: No If all of the above answers are "NO", then may proceed with Cephalosporin use.     Social History   Socioeconomic History  . Marital status: Widowed    Spouse name: Not on file  . Number of children: Not on file  . Years of education: Not on file  . Highest education level: Not on file  Occupational History  . Occupation: retired  Tobacco Use  . Smoking status: Never Smoker  . Smokeless tobacco: Never Used  Substance and Sexual Activity  . Alcohol use: No  . Drug use: No  . Sexual activity: Never  Other Topics Concern  . Not on  file  Social History Narrative  . Not on file   Social Determinants of Health   Financial Resource Strain:   . Difficulty of Paying Living Expenses:   Food Insecurity:   . Worried About Charity fundraiser in the Last Year:   . Arboriculturist in the Last Year:   Transportation Needs:   . Film/video editor (Medical):   [name] Lack of Transportation (Non-Medical):   Physical Activity:   . Days  of Exercise per Week:   . Minutes of Exercise per Session:   Stress:   . Feeling of Stress :   Social Connections:   . Frequency of Communication with Friends and Family:   . Frequency of Social Gatherings with Friends and Family:   . Attends Religious Services:   . Active Member of Clubs or Organizations:   . Attends Archivist Meetings:   [name] Marital Status:   Intimate Partner Violence:   . Fear of Current or Ex-Partner:   . Emotionally Abused:   [name] Physically Abused:   . Sexually Abused:      Family History  Problem Relation Age of Onset  . Cancer Mother   . Depression Mother   . Diabetes Mother   . Hypertension Mother   . Aneurysm Mother   . Heart disease Father   . Diabetes Sister   . Diabetes Brother   . Diabetes Brother      ROS:  Please see the history of present illness.     All other systems reviewed and negative.    Physical Exam: Blood pressure 120/76, pulse 77, height 5\' 6"  (1.676 m), weight 206 lb 4 oz (93.6 kg), SpO2 97 %. General: Well developed, well nourished female in no acute distress. Head: Normocephalic, atraumatic, sclera non-icteric, no xanthomas, nares are without discharge. EENT: normal  Lymph Nodes:  none Neck: Negative for carotid bruits. JVD >jaw + HJR. Back:without scoliosis kyphosis  Lungs: Clear bilaterally to auscultation without wheezes, rales, or rhonchi. Breathing is unlabored. Heart: RRR with S1 S2.  2/6 systolic murmur . No rubs, or gallops appreciated. Abdomen: Soft, non-tender, non-distended with normoactive bowel sounds.  No hepatomegaly. No rebound/guarding. No obvious abdominal masses. Msk:  Strength and tone appear normal for age. Extremities: No clubbing or cyanosis. 2 + edema.  Distal pedal pulses are 2+ and equal bilaterally. Skin: Warm and Dry Neuro: Alert and oriented X 3. CN III-XII intact Grossly normal sensory and motor function . Psych:  Responds to questions appropriately with a normal affect.      Labs: Cardiac Enzymes No results for input(s): CKTOTAL, CKMB, TROPONINI in the last 72 hours. CBC Lab Results  Component Value Date   WBC 6.6 11/28/2018   HGB 12.8 11/28/2018   HCT 40.9 11/28/2018   MCV 92.5 11/28/2018   PLT 182 11/28/2018   PROTIME: No results for input(s): LABPROT, INR in the last 72 hours. Chemistry No results for input(s): NA, K, CL, CO2, BUN, CREATININE, CALCIUM, PROT, BILITOT, ALKPHOS, ALT, AST, GLUCOSE in the last 168 hours.  Invalid input(s): LABALBU Lipids Lab Results  Component Value Date   CHOL 173 11/17/2016   HDL 69 11/17/2016   LDLCALC 85 11/17/2016   TRIG 94 11/17/2016   BNP No results found for: PROBNP Thyroid Function Tests: No results for input(s): TSH, T4TOTAL, T3FREE, THYROIDAB in the last 72 hours.  Invalid input(s): FREET3 Miscellaneous No results found for: DDIMER  Radiology/Studies:  NM Myocar Multi W/Spect W/Wall Motion / EF  Result Date: 07/22/2019  No T wave inversion was noted during stress.  There was no ST segment deviation noted during stress.  Defect 1: There is a medium defect of moderate severity present in the basal inferior and mid inferior location. This could be related to previous inferior infarct although attenuation artifact cannot be completely excluded  This is a low risk study.  The left ventricular ejection fraction is normal (55-65%).  The study is suboptimal due to extracardiac uptake.  Consider different testing modality    ECHOCARDIOGRAM COMPLETE  Result Date: 08/12/2019    ECHOCARDIOGRAM REPORT   Patient  Name:   SAMARYA KOVACK Date of Exam: 08/12/2019 Medical Rec #:  [id number]     Height:       66.0 in Accession #:    [id number]    Weight:       207.0 lb Date of Birth:  [birth year]      BSA:          2.029 m Patient Age:    60 years      BP:           126/78 mmHg Patient Gender: F             HR:           75 bpm. Exam Location:  [facility] Procedure: 2D Echo, Cardiac Doppler and Color Doppler Indications:    R06.9 DOE  History:        Patient has prior history of Echocardiogram examinations, most                 recent 11/21/2018. CAD and Previous Myocardial Infarction, Prior                 CABG and Pacemaker, Stroke and TIA, Signs/Symptoms:Syncope,                 Dyspnea and lymphedema, lower extremity edema; Risk                 Factors:Dyslipidemia, Hypertension and Non-Smoker.  Sonographer:    Hester Mates BS, RVT, RDCS Referring Phys: [id number] Kate Sable  Sonographer Comments: Image acquisition challenging due to patient body habitus. IMPRESSIONS  1. Left ventricular ejection fraction, by estimation, is 50 to 55%. The left ventricle has low normal function. Left ventricular endocardial border not optimally defined to evaluate regional wall motion. There is mild left ventricular hypertrophy. Left ventricular diastolic parameters are indeterminate.  2. Right ventricular systolic function is normal. The right ventricular size is normal. There is moderately elevated pulmonary artery systolic pressure.  3. Left atrial size was severely dilated.  4. Right atrial size was mildly dilated.  5. The mitral valve is normal in structure. Moderate mitral valve regurgitation. No evidence of mitral stenosis.  6. Tricuspid valve regurgitation is severe.  7. The aortic valve is normal in structure. Aortic valve regurgitation is trivial. Mild aortic valve sclerosis is present, with no evidence of aortic valve stenosis.  8. Moderately dilated pulmonary artery.  9. The inferior vena cava is normal in size with <50% respiratory  variability, suggesting right atrial pressure of 8 mmHg. Comparison(s): Previous Echo showed LV EF 50-55%, no RWMA, moderately increased RV wall thickness, trivial AI, moderate LAE. FINDINGS  Left Ventricle: Left ventricular ejection fraction, by estimation, is 50 to 55%. The left ventricle has low normal function. Left ventricular endocardial border not optimally defined to evaluate regional wall motion. The left ventricular internal cavity  size was normal in size. There is mild left ventricular hypertrophy. Left ventricular diastolic parameters are indeterminate. Right Ventricle: The right ventricular size is normal. No increase in right ventricular wall thickness. Right ventricular systolic function is normal. There is moderately elevated pulmonary artery systolic pressure. The tricuspid regurgitant velocity is 3.39 m/s, and with an assumed right atrial pressure of 10 mmHg, the estimated right ventricular systolic pressure is [id number]. Left Atrium: Left atrial size was severely dilated. Right Atrium: Right atrial size was mildly dilated.  Pericardium: A small pericardial effusion is present. The pericardial effusion is circumferential. Mitral Valve: The mitral valve is normal in structure. There is moderate thickening of the mitral valve leaflet(s). Normal mobility of the mitral valve leaflets. Moderate mitral valve regurgitation. No evidence of mitral valve stenosis. Tricuspid Valve: The tricuspid valve is normal in structure. Tricuspid valve regurgitation is severe. No evidence of tricuspid stenosis. Aortic Valve: The aortic valve is normal in structure. Aortic valve regurgitation is trivial. Aortic regurgitation PHT measures 858 msec. Mild aortic valve sclerosis is present, with no evidence of aortic valve stenosis. Pulmonic Valve: The pulmonic valve was normal in structure. Pulmonic valve regurgitation is mild to moderate. No evidence of pulmonic stenosis. Aorta: The aortic root is normal in size and  structure. Pulmonary Artery: The pulmonary artery is moderately dilated. Venous: The inferior vena cava is normal in size with less than 50% respiratory variability, suggesting right atrial pressure of 8 mmHg. IAS/Shunts: No atrial level shunt detected by color flow Doppler. Additional Comments: A pacer wire is visualized.  LEFT VENTRICLE PLAX 2D LVIDd:         4.70 cm  Diastology LVIDs:         3.55 cm  LV e' lateral:   13.20 cm/s LV PW:         1.10 cm  LV E/e' lateral: 10.7 LV IVS:        1.40 cm  LV e' medial:    6.31 cm/s LVOT diam:     2.00 cm  LV E/e' medial:  22.3 LV SV:         87 LV SV Index:   43 LVOT Area:     3.14 cm  RIGHT VENTRICLE RV Basal diam:  4.60 cm RV Mid diam:    3.00 cm RV S prime:     8.59 cm/s TAPSE (M-mode): 1.9 cm LEFT ATRIUM              Index       RIGHT ATRIUM           Index LA diam:        4.50 cm  2.22 cm/m  RA Area:     22.90 cm LA Vol (A2C):   91.2 ml  44.94 ml/m RA Volume:   77.20 ml  38.04 ml/m LA Vol (A4C):   139.0 ml 68.50 ml/m LA Biplane Vol: 118.0 ml 58.15 ml/m  AORTIC VALVE             PULMONIC VALVE LVOT Vmax:   127.00 cm/s PV Vmax:       1.05 m/s LVOT Vmean:  89.700 cm/s PV Peak grad:  4.4 mmHg LVOT VTI:    0.277 m AI PHT:      858 msec  AORTA Ao Root diam: 3.70 cm Ao Asc diam:  3.20 cm MITRAL VALVE                TRICUSPID VALVE MV Area (PHT): 8.62 cm     TR Peak grad:   46.0 mmHg MV Decel Time: 88 msec      TR Vmax:        339.00 cm/s MV E velocity: 141.00 cm/s MV A velocity: 40.00 cm/s   SHUNTS MV E/A ratio:  3.52         Systemic VTI:  0.28 m  Systemic Diam: 2.00 cm Kathlyn Sacramento MD Electronically signed by Kathlyn Sacramento MD Signature Date/Time: 08/12/2019/5:45:22 PM    Final     EKG: Intermittent ventricular pacing with pacing spikes in the ST segment and some short AV delays at about 80 ms suggestive of ventricular safety pacing   Assessment and Plan:  Sinus node dysfunction  First-degree AV  block  Pacemaker-Medtronic  Congestive heart failure acute/chronic/diastolic  Diabetes  Abnormal electrocardiogram   The patient has a Medtronic pacemaker with about 2-1/2 years to go.    The atrial lead is in the ventricular port and the ventricular lead is in the atrial port.  Hence she has been doing a great deal of ventricular pacing  This may likely be contributing to her congestive heart failure.  We have discussed lead revision and at that time would undertake generator replacement  She is significantly volume overloaded.  We will change her diuretic from furosemide--torsemide.    Virl Axe

## 2019-08-13 NOTE — Patient Instructions (Addendum)
Medication Instructions:  - Your physician has recommended you make the following change in your medication:   1) STOP lasix (furosemide)  2) START demadex (torsemide) 20 mg- take 2 tablets (40 mg) by mouth ONCE daily   *If you need a refill on your cardiac medications before your next appointment, please call your pharmacy*   Lab Work: - Your physician recommends that you have lab work today: BMP/ TSH   DO THIS 1st- Pre procedure lab work: Tuesday 09/03/19 (7:30 am- 12:30 pm) - come to the [place] entrance at [facility], 1st desk on the right to check in (just past the screening table)   DO THIS 2nd- Pre procedure COVID swab: Tuesday 09/03/19 (8:00 am- 1:00 pm) - Drive up to the [facility] entrance at [facility] - staff will come out to the car to swab you  If you have labs (blood work) drawn today and your tests are completely normal, you will receive your results only by: [name] MyChart Message (if you have MyChart) OR . A paper copy in the mail If you have any lab test that is abnormal or we need to change your treatment, we will call you to review the results.   Testing/Procedures: - Your physician has recommended that you have a Pacemaker Lead Revision & Generator (Battery) replacement   Follow-Up: At [facility], you and your health needs are our priority.  As part of our continuing mission to provide you with exceptional heart care, we have created designated Provider Care Teams.  These Care Teams include your primary Cardiologist (physician) and Advanced Practice Providers (APPs -  Physician Assistants and Nurse Practitioners) who all work together to provide you with the care you need, when you need it.  We recommend signing up for the patient portal called "MyChart".  Sign up information is provided on this After Visit Summary.  MyChart is used to connect with patients for Virtual Visits (Telemedicine).  Patients are able to view lab/test results, encounter notes, upcoming  appointments, etc.  Non-urgent messages can be sent to your provider as well.   To learn more about what you can do with MyChart, go to [email or web address].    Your next appointment:   1) 10-14 days (from 09/05/19) with the [facility] for a wound check in the [place] office  2) 91 days (from 09/05/19) with Dr. Caryl Comes  The format for your next appointment:   In Person  Provider:   As above   Other Instructions  Torsemide Oral Tablets What is this medicine? TORSEMIDE (TORE se mide) is a diuretic. It helps you make more urine and lose salt and water from your body. It treats swelling from heart, kidney, or liver disease. It also treats high blood pressure. This medicine may be used for other purposes; ask your health care provider or pharmacist if you have questions. COMMON BRAND NAME(S): Demadex What should I tell my health care provider before I take this medicine? They need to know if you have any of these conditions:  high or low levels of electrolytes, like magnesium, potassium, and sodium, in your blood  diabetes  gout  kidney disease  liver disease  an unusual or allergic reaction to torsemide, povidone, other medicines, foods, dyes, or preservatives  pregnant or trying to get pregnant  breast-feeding How should I use this medicine? Take this drug by mouth with water. Take it as directed on the prescription label at the same time every day. Keep taking it unless your health  care provider tells you to stop. Talk to your health care provider about the use of this drug in children. Special care may be needed. Overdosage: If you think you have taken too much of this medicine contact a poison control center or emergency room at once. NOTE: This medicine is only for you. Do not share this medicine with others. What if I miss a dose? If you miss a dose, take it as soon as you can. If it is almost time for your next dose, take only that dose. Do not take double or  extra doses. What may interact with this medicine?  alcohol  aspirin and aspirin-like medicines  celecoxib  certain medicines for blood pressure, heart disease, irregular heartbeat  certain medicines for cholesterol like cholestyramine  certain medicines for diabetes  cisplatin  cyclosporine  ephedra  ginseng  lithium  medicines for infection like acyclovir, adefovir, amphotericin B, bacitracin, cidofovir, foscarnet, ganciclovir, gentamicin, pentamidine, vancomycin  medicines that relax muscles for surgery  NSAIDs, medicines for pain and inflammation, like ibuprofen or naproxen  other diuretics  pamidronate  probenecid  rifampin  steroid medicines like prednisone or cortisone  warfarin  zoledronic acid This list may not describe all possible interactions. Give your health care provider a list of all the medicines, herbs, non-prescription drugs, or dietary supplements you use. Also tell them if you smoke, drink alcohol, or use illegal drugs. Some items may interact with your medicine. What should I watch for while using this medicine? Visit your health care provider for regular checks on your progress. Tell your health care provider if your symptoms do not start to get better or if they get worse. Check your blood pressure regularly. Ask your health care provider what your blood pressure should be. Also, find out when you should contact him or her. You may need blood work done while you are taking this drug. Do not treat yourself for coughs, colds, or pain while using this drug without asking your health care provider for advice. Some drugs may increase your blood pressure. This drug may increase blood sugar. Ask your health care provider if changes in diet or drugs are needed if you have diabetes. You may need to be on a special diet while you are taking this drug. Ask your health care provider. Also, find out how many glasses of fluids you need to drink each  day. Check with your health care provider if you have severe diarrhea, nausea, and vomiting, or if you sweat a lot. The loss of too much body fluid may make it dangerous for you to take this drug. You may get drowsy or dizzy. Do not drive, use machinery, or do anything that needs mental alertness until you know how this drug affects you. Do not stand or sit up quickly, especially if you are an older patient. This reduces the risk of dizzy or fainting spells. Alcohol may interfere with the effects of this drug. Avoid alcoholic drinks. What side effects may I notice from receiving this medicine? Side effects that you should report to your doctor or health care professional as soon as possible:  allergic reactions (skin rash, itching or hives; swelling of the face, lips, or tongue)  decreased hearing, ringing in the ears  electrolyte imbalance (increased thirst; loss of appetite; severe diarrhea; unusual sweating; vomiting)  kidney injury (trouble passing urine or change in the amount of urine)  low potassium (trouble breathing, chest pain; dizziness; fast, irregular heartbeat; feeling faints or lightheaded, falls;  muscle cramps or pain) Side effects that usually do not require medical attention (report to your doctor or health care professional if they continue or are bothersome):  passing large amounts of urine  stomach pain This list may not describe all possible side effects. Call your doctor for medical advice about side effects. You may report side effects to FDA at 1-800-FDA-1088. Where should I keep my medicine? Keep out of the reach of children and pets. Store at room temperature between 15 and 30 degrees C (59 and 86 degrees F). Do not freeze. Throw away any unused drug after the expiration date. NOTE: This sheet is a summary. It may not cover all possible information. If you have questions about this medicine, talk to your doctor, pharmacist, or health care provider.  2020  Elsevier/Gold Standard (2018-11-22 19:52:40)

## 2019-08-13 NOTE — H&P (View-Only) (Signed)
ELECTROPHYSIOLOGY CONSULT NOTE  Patient ID: Kristi [name], MRN: [id number], DOB/AGE: [birth year] 79 y.o. Admit date: (Not on file) Date of Consult: 08/13/2019  Primary Physician: Lorelee Market, MD Primary Cardiologist: [facility]     Kristi [name] is a 79 y.o. female who is being seen today for the evaluation of [pacer at the request of [facility].    HPI Kristi [name] is a 79 y.o. female history of a pacemaker implanted by Dr. Kermit Balo for reasons of which she is aware.  Her device was implanted in 2008 and underwent generator replacement 2014.  There are no records in care everywhere further clarified.  She complains of shortness of breath.  With peripheral edema.  No chest pain.  Hx of CAD with prior CABG  and interval stenting  She lives by herself despite her dementia.  Diet is salt restricted but not deplete.  Fluid is restricted also but not deplete.  No syncope or palpitations.  DATE TEST EF   7/18 Myoview  No ischemia ( by report)   1/19 Echo   65 %   5/21 Echo   50-55 % LAE-severe(4.99mm///68 ml/m2)) Pulm Htn PA >56  5/21 Myoview 55-65% Perfusion defect  Artifact v scar    Date Cr K TSH Hgb  9/20 1.1 3.9  12.4             Past Medical History:  Diagnosis Date  . Anxiety   . Arthritis   . Broken arm    left FA 3/17  . Broken arm    left  . CAD (coronary artery disease)    s/p MI  . Cancer ([place])    uterine  . Cervical disc disorder   . Depression   . Diabetes mellitus without complication ([place])   . GERD (gastroesophageal reflux disease)   . Headache   . Hyperlipidemia   . Hypertension   . Hypothyroid   . Myocardial infarction ([place])    15 year ago  . Neck pain 06/04/2014  . Parkinson's disease ([place])   . Presence of permanent cardiac pacemaker   . Spinal stenosis       Surgical History:  Past Surgical History:  Procedure Laterality Date  . ABDOMINAL HYSTERECTOMY     partial  . BREAST SURGERY    . COLONOSCOPY    . COLONOSCOPY WITH PROPOFOL N/A  11/29/2017   Procedure: COLONOSCOPY WITH PROPOFOL;  Surgeon: [name], MD;  Location: [facility] ENDOSCOPY;  Service: Endoscopy;  Laterality: N/A;  . CORONARY ANGIOPLASTY     stent  . CORONARY ARTERY BYPASS GRAFT    . CORONARY STENT PLACEMENT    . ESOPHAGOGASTRODUODENOSCOPY (EGD) WITH PROPOFOL N/A 04/18/2017   Procedure: ESOPHAGOGASTRODUODENOSCOPY (EGD) WITH PROPOFOL;  Surgeon: [name], MD;  Location: [facility] ENDOSCOPY;  Service: Endoscopy;  Laterality: N/A;  . ESOPHAGOGASTRODUODENOSCOPY (EGD) WITH PROPOFOL N/A 11/29/2017   Procedure: ESOPHAGOGASTRODUODENOSCOPY (EGD) WITH PROPOFOL;  Surgeon: [name], MD;  Location: [facility] ENDOSCOPY;  Service: Endoscopy;  Laterality: N/A;  . INSERT / REPLACE / REMOVE PACEMAKER    . s/p pacer insertion    . TRANSANAL EXCISION OF RECTAL MASS N/A 01/17/2018   Procedure: TRANSANAL EXCISION OF RECTAL POLYP;  Surgeon: [name], MD;  Location: [facility] ORS;  Service: General;  Laterality: N/A;     Home Meds: Current Meds  Medication Sig  . aspirin EC 81 MG tablet Take 81 mg by mouth daily.  . cholestyramine (QUESTRAN) 4 g packet One packet daily in water  .  donepezil (ARICEPT) 10 MG tablet Take 10 mg by mouth at bedtime.  . furosemide (LASIX) 40 MG tablet Take 1 tablet (40 mg total) by mouth 2 (two) times daily.  [name] gabapentin (NEURONTIN) 300 MG capsule Take 300-600 mg by mouth See admin instructions. Take 300 mg by mouth and 600 mg at bedtime  . glimepiride (AMARYL) 1 MG tablet Take 1 tablet (1 mg total) by mouth daily with breakfast. (Patient taking differently: Take 2 mg by mouth daily. )  . hydrocortisone 2.5 % ointment Apply topically 2 (two) times daily.  . hydrOXYzine (ATARAX/VISTARIL) 25 MG tablet Take 25 mg by mouth 2 (two) times daily.  [name] levothyroxine (SYNTHROID) 100 MCG tablet Take 100 mcg by mouth daily before breakfast.  . lidocaine (XYLOCAINE) 5 % ointment Apply 1 application topically as needed.  . loratadine (CLARITIN) 10 MG  tablet Take by mouth.  . meclizine (ANTIVERT) 12.5 MG tablet Take 12.5 mg by mouth 2 (two) times daily as needed.   . Melatonin 5 MG CAPS Take by mouth.  . montelukast (SINGULAIR) 10 MG tablet   . nitrofurantoin (MACRODANTIN) 100 MG capsule Take 100 mg by mouth at bedtime.  . nitroGLYCERIN (NITROSTAT) 0.4 MG SL tablet Place under the tongue.  [name] OLANZapine (ZYPREXA) 2.5 MG tablet Take 2.5 mg by mouth at bedtime.  . Omega-3 Fatty Acids (FISH OIL PO) Take by mouth daily.  [name] omeprazole (PRILOSEC) 40 MG capsule Take 40 mg by mouth 2 (two) times daily.  . pioglitazone (ACTOS) 30 MG tablet   . potassium chloride SA (K-DUR,KLOR-CON) 20 MEQ tablet Take 20 mEq by mouth daily.   . rosuvastatin (CRESTOR) 40 MG tablet Take 40 mg by mouth daily.  . sertraline (ZOLOFT) 100 MG tablet Take 100 mg by mouth 2 (two) times daily.  [name] VITAMIN D PO Take by mouth daily.  [name] VITAMIN E PO Take by mouth daily.    Allergies:  Allergies  Allergen Reactions  . Penicillins Anaphylaxis  . Penicillin G Swelling and Rash    Has patient had a PCN reaction causing immediate rash, facial/tongue/throat swelling, SOB or lightheadedness with hypotension: Yes Has patient had a PCN reaction causing severe rash involving mucus membranes or skin necrosis: No Has patient had a PCN reaction that required hospitalization: No Has patient had a PCN reaction occurring within the last 10 years: No If all of the above answers are "NO", then may proceed with Cephalosporin use.     Social History   Socioeconomic History  . Marital status: Widowed    Spouse name: Not on file  . Number of children: Not on file  . Years of education: Not on file  . Highest education level: Not on file  Occupational History  . Occupation: retired  Tobacco Use  . Smoking status: Never Smoker  . Smokeless tobacco: Never Used  Substance and Sexual Activity  . Alcohol use: No  . Drug use: No  . Sexual activity: Never  Other Topics Concern  . Not on  file  Social History Narrative  . Not on file   Social Determinants of Health   Financial Resource Strain:   . Difficulty of Paying Living Expenses:   Food Insecurity:   . Worried About Charity fundraiser in the Last Year:   . Arboriculturist in the Last Year:   Transportation Needs:   . Film/video editor (Medical):   [name] Lack of Transportation (Non-Medical):   Physical Activity:   . Days  of Exercise per Week:   . Minutes of Exercise per Session:   Stress:   . Feeling of Stress :   Social Connections:   . Frequency of Communication with Friends and Family:   . Frequency of Social Gatherings with Friends and Family:   . Attends Religious Services:   . Active Member of Clubs or Organizations:   . Attends Archivist Meetings:   [name] Marital Status:   Intimate Partner Violence:   . Fear of Current or Ex-Partner:   . Emotionally Abused:   [name] Physically Abused:   . Sexually Abused:      Family History  Problem Relation Age of Onset  . Cancer Mother   . Depression Mother   . Diabetes Mother   . Hypertension Mother   . Aneurysm Mother   . Heart disease Father   . Diabetes Sister   . Diabetes Brother   . Diabetes Brother      ROS:  Please see the history of present illness.     All other systems reviewed and negative.    Physical Exam: Blood pressure 120/76, pulse 77, height 5\' 6"  (1.676 m), weight 206 lb 4 oz (93.6 kg), SpO2 97 %. General: Well developed, well nourished female in no acute distress. Head: Normocephalic, atraumatic, sclera non-icteric, no xanthomas, nares are without discharge. EENT: normal  Lymph Nodes:  none Neck: Negative for carotid bruits. JVD >jaw + HJR. Back:without scoliosis kyphosis  Lungs: Clear bilaterally to auscultation without wheezes, rales, or rhonchi. Breathing is unlabored. Heart: RRR with S1 S2.  2/6 systolic murmur . No rubs, or gallops appreciated. Abdomen: Soft, non-tender, non-distended with normoactive bowel sounds.  No hepatomegaly. No rebound/guarding. No obvious abdominal masses. Msk:  Strength and tone appear normal for age. Extremities: No clubbing or cyanosis. 2 + edema.  Distal pedal pulses are 2+ and equal bilaterally. Skin: Warm and Dry Neuro: Alert and oriented X 3. CN III-XII intact Grossly normal sensory and motor function . Psych:  Responds to questions appropriately with a normal affect.      Labs: Cardiac Enzymes No results for input(s): CKTOTAL, CKMB, TROPONINI in the last 72 hours. CBC Lab Results  Component Value Date   WBC 6.6 11/28/2018   HGB 12.8 11/28/2018   HCT 40.9 11/28/2018   MCV 92.5 11/28/2018   PLT 182 11/28/2018   PROTIME: No results for input(s): LABPROT, INR in the last 72 hours. Chemistry No results for input(s): NA, K, CL, CO2, BUN, CREATININE, CALCIUM, PROT, BILITOT, ALKPHOS, ALT, AST, GLUCOSE in the last 168 hours.  Invalid input(s): LABALBU Lipids Lab Results  Component Value Date   CHOL 173 11/17/2016   HDL 69 11/17/2016   LDLCALC 85 11/17/2016   TRIG 94 11/17/2016   BNP No results found for: PROBNP Thyroid Function Tests: No results for input(s): TSH, T4TOTAL, T3FREE, THYROIDAB in the last 72 hours.  Invalid input(s): FREET3 Miscellaneous No results found for: DDIMER  Radiology/Studies:  NM Myocar Multi W/Spect W/Wall Motion / EF  Result Date: 07/22/2019  No T wave inversion was noted during stress.  There was no ST segment deviation noted during stress.  Defect 1: There is a medium defect of moderate severity present in the basal inferior and mid inferior location. This could be related to previous inferior infarct although attenuation artifact cannot be completely excluded  This is a low risk study.  The left ventricular ejection fraction is normal (55-65%).  The study is suboptimal due to extracardiac uptake.  Consider different testing modality    ECHOCARDIOGRAM COMPLETE  Result Date: 08/12/2019    ECHOCARDIOGRAM REPORT   Patient  Name:   Kristi [name] Date of Exam: 08/12/2019 Medical Rec #:  [id number]     Height:       66.0 in Accession #:    [id number]    Weight:       207.0 lb Date of Birth:  02-12-1941      BSA:          2.029 m Patient Age:    42 years      BP:           126/78 mmHg Patient Gender: F             HR:           75 bpm. Exam Location:  [place] Procedure: 2D Echo, Cardiac Doppler and Color Doppler Indications:    R06.9 DOE  History:        Patient has prior history of Echocardiogram examinations, most                 recent 11/21/2018. CAD and Previous Myocardial Infarction, Prior                 CABG and Pacemaker, Stroke and TIA, Signs/Symptoms:Syncope,                 Dyspnea and lymphedema, lower extremity edema; Risk                 Factors:Dyslipidemia, Hypertension and Non-Smoker.  Sonographer:    Hester Mates BS, RVT, RDCS Referring Phys: [id number] Kate Sable  Sonographer Comments: Image acquisition challenging due to patient body habitus. IMPRESSIONS  1. Left ventricular ejection fraction, by estimation, is 50 to 55%. The left ventricle has low normal function. Left ventricular endocardial border not optimally defined to evaluate regional wall motion. There is mild left ventricular hypertrophy. Left ventricular diastolic parameters are indeterminate.  2. Right ventricular systolic function is normal. The right ventricular size is normal. There is moderately elevated pulmonary artery systolic pressure.  3. Left atrial size was severely dilated.  4. Right atrial size was mildly dilated.  5. The mitral valve is normal in structure. Moderate mitral valve regurgitation. No evidence of mitral stenosis.  6. Tricuspid valve regurgitation is severe.  7. The aortic valve is normal in structure. Aortic valve regurgitation is trivial. Mild aortic valve sclerosis is present, with no evidence of aortic valve stenosis.  8. Moderately dilated pulmonary artery.  9. The inferior vena cava is normal in size with <50% respiratory  variability, suggesting right atrial pressure of 8 mmHg. Comparison(s): Previous Echo showed LV EF 50-55%, no RWMA, moderately increased RV wall thickness, trivial AI, moderate LAE. FINDINGS  Left Ventricle: Left ventricular ejection fraction, by estimation, is 50 to 55%. The left ventricle has low normal function. Left ventricular endocardial border not optimally defined to evaluate regional wall motion. The left ventricular internal cavity  size was normal in size. There is mild left ventricular hypertrophy. Left ventricular diastolic parameters are indeterminate. Right Ventricle: The right ventricular size is normal. No increase in right ventricular wall thickness. Right ventricular systolic function is normal. There is moderately elevated pulmonary artery systolic pressure. The tricuspid regurgitant velocity is 3.39 m/s, and with an assumed right atrial pressure of 10 mmHg, the estimated right ventricular systolic pressure is [id number]. Left Atrium: Left atrial size was severely dilated. Right Atrium: Right atrial size was mildly dilated.  Pericardium: A small pericardial effusion is present. The pericardial effusion is circumferential. Mitral Valve: The mitral valve is normal in structure. There is moderate thickening of the mitral valve leaflet(s). Normal mobility of the mitral valve leaflets. Moderate mitral valve regurgitation. No evidence of mitral valve stenosis. Tricuspid Valve: The tricuspid valve is normal in structure. Tricuspid valve regurgitation is severe. No evidence of tricuspid stenosis. Aortic Valve: The aortic valve is normal in structure. Aortic valve regurgitation is trivial. Aortic regurgitation PHT measures 858 msec. Mild aortic valve sclerosis is present, with no evidence of aortic valve stenosis. Pulmonic Valve: The pulmonic valve was normal in structure. Pulmonic valve regurgitation is mild to moderate. No evidence of pulmonic stenosis. Aorta: The aortic root is normal in size and  structure. Pulmonary Artery: The pulmonary artery is moderately dilated. Venous: The inferior vena cava is normal in size with less than 50% respiratory variability, suggesting right atrial pressure of 8 mmHg. IAS/Shunts: No atrial level shunt detected by color flow Doppler. Additional Comments: A pacer wire is visualized.  LEFT VENTRICLE PLAX 2D LVIDd:         4.70 cm  Diastology LVIDs:         3.55 cm  LV e' lateral:   13.20 cm/s LV PW:         1.10 cm  LV E/e' lateral: 10.7 LV IVS:        1.40 cm  LV e' medial:    6.31 cm/s LVOT diam:     2.00 cm  LV E/e' medial:  22.3 LV SV:         87 LV SV Index:   43 LVOT Area:     3.14 cm  RIGHT VENTRICLE RV Basal diam:  4.60 cm RV Mid diam:    3.00 cm RV S prime:     8.59 cm/s TAPSE (M-mode): 1.9 cm LEFT ATRIUM              Index       RIGHT ATRIUM           Index LA diam:        4.50 cm  2.22 cm/m  RA Area:     22.90 cm LA Vol (A2C):   91.2 ml  44.94 ml/m RA Volume:   77.20 ml  38.04 ml/m LA Vol (A4C):   139.0 ml 68.50 ml/m LA Biplane Vol: 118.0 ml 58.15 ml/m  AORTIC VALVE             PULMONIC VALVE LVOT Vmax:   127.00 cm/s PV Vmax:       1.05 m/s LVOT Vmean:  89.700 cm/s PV Peak grad:  4.4 mmHg LVOT VTI:    0.277 m AI PHT:      858 msec  AORTA Ao Root diam: 3.70 cm Ao Asc diam:  3.20 cm MITRAL VALVE                TRICUSPID VALVE MV Area (PHT): 8.62 cm     TR Peak grad:   46.0 mmHg MV Decel Time: 88 msec      TR Vmax:        339.00 cm/s MV E velocity: 141.00 cm/s MV A velocity: 40.00 cm/s   SHUNTS MV E/A ratio:  3.52         Systemic VTI:  0.28 m  Systemic Diam: 2.00 cm Kathlyn Sacramento MD Electronically signed by Kathlyn Sacramento MD Signature Date/Time: 08/12/2019/5:45:22 PM    Final     EKG: Intermittent ventricular pacing with pacing spikes in the ST segment and some short AV delays at about 80 ms suggestive of ventricular safety pacing   Assessment and Plan:  Sinus node dysfunction  First-degree AV block  Pacemaker-Medtronic   Congestive heart failure acute/chronic/diastolic  Diabetes  Abnormal electrocardiogram   The patient has a Medtronic pacemaker with about 2-1/2 years to go.    The atrial lead is in the ventricular port and the ventricular lead is in the atrial port.  Hence she has been doing a great deal of ventricular pacing  This may likely be contributing to her congestive heart failure.  We have discussed lead revision and at that time would undertake generator replacement  She is significantly volume overloaded.  We will change her diuretic from furosemide--torsemide.    Kristi [name]

## 2019-08-14 LAB — BASIC METABOLIC PANEL
BUN/Creatinine Ratio: 10 — ABNORMAL LOW (ref 12–28)
BUN: 10 mg/dL (ref 8–27)
CO2: 29 mmol/L (ref 20–29)
Calcium: 9.2 mg/dL (ref 8.7–10.3)
Chloride: 99 mmol/L (ref 96–106)
Creatinine, Ser: 1 mg/dL (ref 0.57–1.00)
GFR calc Af Amer: 61 mL/min/{1.73_m2} (ref >59)
GFR calc non Af Amer: 53 mL/min/{1.73_m2} — ABNORMAL LOW (ref >59)
Glucose: 156 mg/dL — ABNORMAL HIGH (ref 65–99)
Potassium: 4.4 mmol/L (ref 3.5–5.2)
Sodium: 142 mmol/L (ref 134–144)

## 2019-08-14 LAB — TSH: TSH: 11.3 u[IU]/mL — ABNORMAL HIGH (ref 0.450–4.500)

## 2019-08-16 ENCOUNTER — Telehealth: Payer: Self-pay | Admitting: Internal Medicine

## 2019-08-16 ENCOUNTER — Ambulatory Visit: Payer: Medicare Other | Admitting: Cardiology

## 2019-08-16 NOTE — Telephone Encounter (Signed)
I spoke with the patient. She was seen in clinic on 08/13/19 by Dr. Caryl Comes. Her lasix was stopped due to edema and she was switched to torsemide 40 mg once daily.   She called today to report symptoms of a headache/ dizziness/ & vomiting once yesterday and today.  The patient reports a large amount of urine output the first day she took torsemide, but then this has slacked off the last 2 days.  She does not weigh at home/ check her BP.  I have reviewed this with Dr. Caryl Comes and orders received to:  1) hold torsemide tomorrow (Saturday) 2) take torsemide 20 mg x 1 dose (Sunday) 3) hold torsemide Monday 4) I will call her Tuesday to see how she is  The patient has been made aware of the above recommendations and voices understanding.

## 2019-08-16 NOTE — Telephone Encounter (Signed)
Pt c/o medication issue:  1. Name of Medication: torsemide   2. How are you currently taking this medication (dosage and times per day)? 20 mg tablets daily in the morning  3. Are you having a reaction (difficulty breathing--STAT)?   4. What is your medication issue? Dizzy, head feels awful, throwing up  Please advise

## 2019-08-21 MED ORDER — LEVOTHYROXINE SODIUM 112 MCG PO TABS: 112.0000 ug | ORAL_TABLET | Freq: Every day | ORAL | 1 refills | Status: DC

## 2019-08-21 NOTE — Telephone Encounter (Addendum)
1) I spoke with the patient. I advised her I was so very sorry I missed calling her yesterday.  She states that she has not had any more headaches/ dizziness/ vomiting since we spoke.  She reports she: - took torsemide 20 mg once on Saturday - held this on Sunday - took torsemide 20 mg once on Monday - held this on Monday  She has had no torsemide today. She reports her swelling has gotten worse and she is having lower extremity and abdominal swelling. She has no scale at home. We discussed the importance of having a scale to obtain daily weights so we can more easily judge how to adjust her fluid medication.  I have advised the patient since the torsemide 40 mg once daily dose seemed to high for her and alternating 20 mg once every other day has not been enough for her, she should resume torsemide at 20 mg once daily for now. I have asked that she take this dose for the next several days to see how this helps her fluid status. She is aware to call back if no improvement.  She is aware if we need to go up on the dose, Dr. Caryl Comes may recommend torsemide 20 mg once every other day alternating with 40 mg once every other day.   I will send this to Dr. Caryl Comes as an FYI/ further recommendations.     2) I have also notified the patient of her recent BMP/ TSH results.  Per Dr. Caryl Comes,  [name], MD  08/19/2019 2:25 PM EDT    Please Inform Patient  Labs are normal x TSH is elevated she should increaes her synthroid to 112 mcg and followup with her PCP in about 2 months about that    Thanks    The patient confirms understanding of Dr. Olin Pia recommendations to increase synthroid to 112 mcg.  She is aware I will forward a copy to her PCP to review and follow up. The patient states she is due to see Dr. Bernita Buffy- she is needing her routine exams (PAP/ mammogram/ sugar check). I have asked that she give his office a call in the next few days to see when they can arrange for  follow up. The patient is agreeable.

## 2019-08-21 NOTE — Telephone Encounter (Signed)
Patient calling to report no continued symptoms .  Patient wants to know what to do about meds now.

## 2019-08-22 ENCOUNTER — Telehealth: Payer: Self-pay | Admitting: Internal Medicine

## 2019-08-22 ENCOUNTER — Other Ambulatory Visit: Payer: Self-pay | Admitting: Internal Medicine

## 2019-08-22 DIAGNOSIS — Z950 Presence of cardiac pacemaker: Secondary | ICD-10-CM

## 2019-08-22 NOTE — Telephone Encounter (Signed)
I spoke with staff at [facility]. They state they were confused by the RX we sent in yesterday for synthroid 112 mcg once daily as a dose increase.  They report Dr. Brunetta Genera has had her on synthroid 150 mcg once daily since February.  I advised the patient has told us her synthroid dose is 100 mcg once daily.   I advised I will need to review with Dr. Caryl Comes and call back.   Dr. Caryl Comes has been advised of the above information.  Per Dr. Caryl Comes- he would like me to call Dr. Gala Murdoch office and advise them that we checked the patient's lab work and her TSH is still 11, he will need to manage this.   I called and spoke with Juliann Pulse at Dr. Gala Murdoch office and advised her of what is going on.  She states she will review with Dr. Brunetta Genera today and they will send in a new RX to the pharmacy.  I have notified [facility] of the above information and asked that they please discontinue the 112 mcg synthroid order.   I have also spoken with the patient and advised her of what is going on. She is aware that Dr. Gala Murdoch office will send in a new RX for her synthroid to the pharmacy today.

## 2019-08-22 NOTE — Telephone Encounter (Signed)
Please call regarding doseage for Levothyroxine.

## 2019-08-26 ENCOUNTER — Other Ambulatory Visit: Payer: Self-pay

## 2019-08-26 ENCOUNTER — Other Ambulatory Visit: Payer: Self-pay | Admitting: General Surgery

## 2019-08-26 ENCOUNTER — Telehealth: Payer: Self-pay | Admitting: Cardiology

## 2019-08-26 ENCOUNTER — Ambulatory Visit
Admission: RE | Admit: 2019-08-26 | Discharge: 2019-08-26 | Disposition: A | Discharge status: Home / Self Care | Payer: Medicare Other | Source: Ambulatory Visit | Attending: General Surgery | Admitting: General Surgery

## 2019-08-26 DIAGNOSIS — R1900 Intra-abdominal and pelvic swelling, mass and lump, unspecified site: Secondary | ICD-10-CM

## 2019-08-26 NOTE — Telephone Encounter (Signed)
Pt c/o swelling: STAT is pt has developed SOB within 24 hours  1) How much weight have you gained and in what time span?  Not sure   2) If swelling, where is the swelling located? All over mostly legs stomach   3) Are you currently taking a fluid pill?  Yes   4) Are you currently SOB? Yes starting this morning   5) Do you have a log of your daily weights (if so, list)? No   6) Have you gained 3 pounds in a day or 5 pounds in a week? Not sure but thinking more than 5 this week   7) Have you traveled recently? No

## 2019-08-26 NOTE — Telephone Encounter (Signed)
Patient made aware of Dr. Thereasa Solo response and recommendation.  Kate Sable, MD  You; Kavin Leech, RN 22 minutes ago (1:08 PM)   Okay for patient to resume taking torsemide 40 mg daily. Thank you    Patient verbalized understanding.

## 2019-08-26 NOTE — Telephone Encounter (Signed)
Spoke with the patient. Patient sts that she has chronic LE swelling and swelling in her abdomen. She does not have a scale at home and does not perform daily weights.  Patient spoke with Nira Conn, RN on 08/21/19 and was told to reduce her Torsemide to 20 mg daily at the instruction of Dr. Caryl Comes.  Patient reports increased swelling and sob the last 1-2 days. She denies, CP, orthopnea, PND. She denies inceased fluid or sodium intake.  I advised the patient to take an extra 20mg  of Torsemide today (total 40 mg) and to call the office in morning to give an update on her symptoms and to get further instructions regarding her Torsemide.  Advised the patient that the ordering physician Dr. Caryl Comes is out of the office. I will fwd the message to her primary cardiologist Dr. Garen Lah for his recommendation regarding her diuretic.  Patients agreeable with the plan and voiced appreciation for the call back.

## 2019-09-03 ENCOUNTER — Other Ambulatory Visit
Admission: RE | Admit: 2019-09-03 | Discharge: 2019-09-03 | Disposition: A | Discharge status: Home / Self Care | Payer: Medicare Other | Source: Ambulatory Visit | Attending: Internal Medicine | Admitting: Internal Medicine

## 2019-09-03 ENCOUNTER — Other Ambulatory Visit: Payer: Self-pay | Admitting: *Deleted

## 2019-09-03 ENCOUNTER — Other Ambulatory Visit
Admission: RE | Admit: 2019-09-03 | Discharge: 2019-09-03 | Disposition: A | Discharge status: Home / Self Care | Payer: Medicare Other | Source: Home / Self Care | Attending: Internal Medicine | Admitting: Internal Medicine

## 2019-09-03 ENCOUNTER — Other Ambulatory Visit: Payer: Self-pay

## 2019-09-03 DIAGNOSIS — I495 Sick sinus syndrome: Secondary | ICD-10-CM

## 2019-09-03 DIAGNOSIS — Z01812 Encounter for preprocedural laboratory examination: Secondary | ICD-10-CM | POA: Insufficient documentation

## 2019-09-03 DIAGNOSIS — Z20822 Contact with and (suspected) exposure to covid-19: Secondary | ICD-10-CM | POA: Diagnosis not present

## 2019-09-03 LAB — CBC WITH DIFFERENTIAL/PLATELET
Abs Immature Granulocytes: 0.01 10*3/uL (ref 0.00–0.07)
Basophils Absolute: 0 10*3/uL (ref 0.0–0.1)
Basophils Relative: 1 %
Eosinophils Absolute: 0.1 10*3/uL (ref 0.0–0.5)
Eosinophils Relative: 2 %
HCT: 34.4 % — ABNORMAL LOW (ref 36.0–46.0)
Hemoglobin: 10.7 g/dL — ABNORMAL LOW (ref 12.0–15.0)
Immature Granulocytes: 0 %
Lymphocytes Relative: 29 %
Lymphs Abs: 1.4 10*3/uL (ref 0.7–4.0)
MCH: 30.3 pg (ref 26.0–34.0)
MCHC: 31.1 g/dL (ref 30.0–36.0)
MCV: 97.5 fL (ref 80.0–100.0)
Monocytes Absolute: 0.3 10*3/uL (ref 0.1–1.0)
Monocytes Relative: 6 %
Neutro Abs: 3 10*3/uL (ref 1.7–7.7)
Neutrophils Relative %: 62 %
Platelets: 215 10*3/uL (ref 150–400)
RBC: 3.53 MIL/uL — ABNORMAL LOW (ref 3.87–5.11)
RDW: 15.9 % — ABNORMAL HIGH (ref 11.5–15.5)
WBC: 4.9 10*3/uL (ref 4.0–10.5)
nRBC: 0 % (ref 0.0–0.2)

## 2019-09-03 NOTE — Progress Notes (Signed)
Bmp/ CBC orders placed.

## 2019-09-04 LAB — SARS CORONAVIRUS 2 (TAT 6-24 HRS): SARS Coronavirus 2: NEGATIVE

## 2019-09-05 ENCOUNTER — Encounter (HOSPITAL_COMMUNITY): Payer: Self-pay | Admitting: Internal Medicine

## 2019-09-05 ENCOUNTER — Other Ambulatory Visit: Payer: Self-pay

## 2019-09-05 ENCOUNTER — Ambulatory Visit (HOSPITAL_COMMUNITY)
Admission: RE | Admit: 2019-09-05 | Discharge: 2019-09-05 | Disposition: A | Discharge status: Home / Self Care | Payer: Medicare Other | Attending: Internal Medicine | Admitting: Internal Medicine

## 2019-09-05 ENCOUNTER — Ambulatory Visit (HOSPITAL_COMMUNITY)
Admission: RE | Disposition: A | Discharge status: Home / Self Care | Payer: Self-pay | Source: Home / Self Care | Attending: Internal Medicine

## 2019-09-05 DIAGNOSIS — G20 Parkinson's disease: Secondary | ICD-10-CM | POA: Diagnosis not present

## 2019-09-05 DIAGNOSIS — Z880 Allergy status to penicillin: Secondary | ICD-10-CM | POA: Diagnosis not present

## 2019-09-05 DIAGNOSIS — I495 Sick sinus syndrome: Secondary | ICD-10-CM | POA: Insufficient documentation

## 2019-09-05 DIAGNOSIS — E785 Hyperlipidemia, unspecified: Secondary | ICD-10-CM | POA: Diagnosis not present

## 2019-09-05 DIAGNOSIS — Z7982 Long term (current) use of aspirin: Secondary | ICD-10-CM | POA: Diagnosis not present

## 2019-09-05 DIAGNOSIS — Z45010 Encounter for checking and testing of cardiac pacemaker pulse generator [battery]: Secondary | ICD-10-CM | POA: Diagnosis not present

## 2019-09-05 DIAGNOSIS — E119 Type 2 diabetes mellitus without complications: Secondary | ICD-10-CM | POA: Insufficient documentation

## 2019-09-05 DIAGNOSIS — Z79899 Other long term (current) drug therapy: Secondary | ICD-10-CM | POA: Diagnosis not present

## 2019-09-05 DIAGNOSIS — F0280 Dementia in other diseases classified elsewhere without behavioral disturbance: Secondary | ICD-10-CM | POA: Diagnosis not present

## 2019-09-05 DIAGNOSIS — I110 Hypertensive heart disease with heart failure: Secondary | ICD-10-CM | POA: Diagnosis not present

## 2019-09-05 DIAGNOSIS — I252 Old myocardial infarction: Secondary | ICD-10-CM | POA: Insufficient documentation

## 2019-09-05 DIAGNOSIS — I5032 Chronic diastolic (congestive) heart failure: Secondary | ICD-10-CM | POA: Insufficient documentation

## 2019-09-05 DIAGNOSIS — Z79890 Hormone replacement therapy (postmenopausal): Secondary | ICD-10-CM | POA: Insufficient documentation

## 2019-09-05 DIAGNOSIS — I440 Atrioventricular block, first degree: Secondary | ICD-10-CM | POA: Insufficient documentation

## 2019-09-05 DIAGNOSIS — Z950 Presence of cardiac pacemaker: Secondary | ICD-10-CM

## 2019-09-05 DIAGNOSIS — E039 Hypothyroidism, unspecified: Secondary | ICD-10-CM | POA: Diagnosis not present

## 2019-09-05 DIAGNOSIS — Z7984 Long term (current) use of oral hypoglycemic drugs: Secondary | ICD-10-CM | POA: Diagnosis not present

## 2019-09-05 DIAGNOSIS — Z951 Presence of aortocoronary bypass graft: Secondary | ICD-10-CM | POA: Diagnosis not present

## 2019-09-05 DIAGNOSIS — F329 Major depressive disorder, single episode, unspecified: Secondary | ICD-10-CM | POA: Insufficient documentation

## 2019-09-05 DIAGNOSIS — T82110A Breakdown (mechanical) of cardiac electrode, initial encounter: Secondary | ICD-10-CM

## 2019-09-05 DIAGNOSIS — K219 Gastro-esophageal reflux disease without esophagitis: Secondary | ICD-10-CM | POA: Diagnosis not present

## 2019-09-05 DIAGNOSIS — F419 Anxiety disorder, unspecified: Secondary | ICD-10-CM | POA: Diagnosis not present

## 2019-09-05 DIAGNOSIS — I2510 Atherosclerotic heart disease of native coronary artery without angina pectoris: Secondary | ICD-10-CM | POA: Diagnosis not present

## 2019-09-05 LAB — GLUCOSE, CAPILLARY
Glucose-Capillary: 157 mg/dL — ABNORMAL HIGH (ref 70–99)
Glucose-Capillary: 163 mg/dL — ABNORMAL HIGH (ref 70–99)

## 2019-09-05 SURGERY — PPM GENERATOR CHANGEOUT

## 2019-09-05 MED ORDER — LIDOCAINE HCL 1 % IJ SOLN
INTRAMUSCULAR | Status: AC
  Filled 2019-09-05: qty 40

## 2019-09-05 MED ORDER — MIDAZOLAM HCL 5 MG/5ML IJ SOLN
INTRAMUSCULAR | Status: AC
  Filled 2019-09-05: qty 5

## 2019-09-05 MED ORDER — SODIUM CHLORIDE 0.9 % IV SOLN
80.0000 mg | INTRAVENOUS | Status: AC
  Administered 2019-09-05: 80 mg

## 2019-09-05 MED ORDER — ONDANSETRON HCL 4 MG/2ML IJ SOLN: 4.0000 mg | Freq: Four times a day (QID) | INTRAMUSCULAR | Status: DC | PRN

## 2019-09-05 MED ORDER — LIDOCAINE HCL 1 % IJ SOLN
INTRAMUSCULAR | Status: AC
  Filled 2019-09-05: qty 20

## 2019-09-05 MED ORDER — VANCOMYCIN HCL IN DEXTROSE 1-5 GM/200ML-% IV SOLN
1000.0000 mg | INTRAVENOUS | Status: AC
  Administered 2019-09-05: 1000 mg via INTRAVENOUS

## 2019-09-05 MED ORDER — FENTANYL CITRATE (PF) 100 MCG/2ML IJ SOLN
INTRAMUSCULAR | Status: AC
  Filled 2019-09-05: qty 2

## 2019-09-05 MED ORDER — VANCOMYCIN HCL IN DEXTROSE 1-5 GM/200ML-% IV SOLN
INTRAVENOUS | Status: AC
  Filled 2019-09-05: qty 200

## 2019-09-05 MED ORDER — ACETAMINOPHEN 325 MG PO TABS: 325.0000 mg | ORAL_TABLET | ORAL | Status: DC | PRN

## 2019-09-05 MED ORDER — HEPARIN (PORCINE) IN NACL 1000-0.9 UT/500ML-% IV SOLN
INTRAVENOUS | Status: AC
  Filled 2019-09-05: qty 500

## 2019-09-05 MED ORDER — LIDOCAINE HCL (PF) 1 % IJ SOLN
INTRAMUSCULAR | Status: DC | PRN
  Administered 2019-09-05: 60 mL

## 2019-09-05 MED ORDER — ONDANSETRON HCL 4 MG/2ML IJ SOLN
4.0000 mg | Freq: Four times a day (QID) | INTRAMUSCULAR | Status: DC | PRN
Start: 2019-09-05 — End: 2019-09-05

## 2019-09-05 MED ORDER — MIDAZOLAM HCL 5 MG/5ML IJ SOLN
INTRAMUSCULAR | Status: DC | PRN
  Administered 2019-09-05 (×2): 1 mg via INTRAVENOUS

## 2019-09-05 MED ORDER — ACETAMINOPHEN 325 MG PO TABS
325.0000 mg | ORAL_TABLET | ORAL | Status: DC | PRN
Start: 2019-09-05 — End: 2019-09-05

## 2019-09-05 MED ORDER — SODIUM CHLORIDE 0.9 % IV SOLN: INTRAVENOUS | Status: DC

## 2019-09-05 MED ORDER — SODIUM CHLORIDE 0.9 % IV SOLN: INTRAVENOUS | Status: DC | PRN

## 2019-09-05 MED ORDER — FENTANYL CITRATE (PF) 100 MCG/2ML IJ SOLN
INTRAMUSCULAR | Status: DC | PRN
  Administered 2019-09-05 (×2): 25 ug via INTRAVENOUS

## 2019-09-05 MED ORDER — SODIUM CHLORIDE 0.9 % IV SOLN
INTRAVENOUS | Status: AC
  Filled 2019-09-05: qty 2

## 2019-09-05 SURGICAL SUPPLY — 7 items
CABLE SURGICAL S-101-97-12 (CABLE) ×2 IMPLANT
HEMOSTAT SURGICEL 2X4 FIBR (HEMOSTASIS) ×2 IMPLANT
IPG PACE AZUR XT DR MRI W1DR01 (Pacemaker) ×1 IMPLANT
PACE AZURE XT DR MRI W1DR01 (Pacemaker) ×2 IMPLANT
PAD PRO RADIOLUCENT 2001M-C (PAD) ×2 IMPLANT
POUCH AIGIS-R ANTIBACT PPM (Mesh General) ×2 IMPLANT
TRAY PACEMAKER INSERTION (PACKS) ×2 IMPLANT

## 2019-09-05 NOTE — Discharge Instructions (Signed)

## 2019-09-05 NOTE — Progress Notes (Signed)
Patient and grand daughter was given discharge instructions. Both verbalized understanding. 

## 2019-09-05 NOTE — Interval H&P Note (Signed)
History and Physical Interval Note:  09/05/2019 9:08 AM  [name]  has presented today for surgery, with the diagnosis of snd.  The various methods of treatment have been discussed with the patient and family. After consideration of risks, benefits and other options for treatment, the patient has consented to  Procedure(s): PPM GENERATOR CHANGEOUT (N/A) as a surgical intervention.  The patient's history has been reviewed, patient examined, no change in status, stable for surgery.  I have reviewed the patient's chart and labs.  Questions were answered to the patient's satisfaction.     Virl Axe  The benefits and risks were reviewed including but not limited to death,  perforation, infection, lead dislodgement and device malfunction.  The patient understands agrees and is willing to proceed.

## 2019-09-06 MED FILL — Lidocaine HCl Local Inj 1%: INTRAMUSCULAR | Qty: 60 | Status: AC

## 2019-09-06 MED FILL — Vancomycin HCl-Dextrose IV Soln 1 GM/200ML-5%: INTRAVENOUS | Qty: 200 | Status: AC

## 2019-09-09 ENCOUNTER — Telehealth: Payer: Self-pay

## 2019-09-09 NOTE — Telephone Encounter (Signed)
Education done on importance of wound check to access incision and device. Patient reports she can take the bus to the [place] office but is unable to find transportation to [place]. Patient may have a granddaughter that could assist with a video visit. Will contact device clinic and let us know if she will have ability for video visit. Device clinic # provided.

## 2019-09-09 NOTE — Telephone Encounter (Signed)
The pt do not have a ride to [facility] for her wound check. I tried to reschedule the appointment but she states she can not come. I let her talk to [place], rn.

## 2019-09-11 NOTE — Telephone Encounter (Signed)
Spoke with pt and her grandaughter.  They were requesting wound check be done today.  Advised it has only been 6 days since implant it is too early to remove dermabond glue for proper device check.  Pt is scheduled for appt on 6/29 at 3pm.  Her granddaughter indicated she can be at pt home to do telephone visit and will be able to send pictures of the site.

## 2019-09-11 NOTE — Telephone Encounter (Signed)
The pt is demanding a video wound check today. I let her speak with Amy, rn.

## 2019-09-13 ENCOUNTER — Telehealth: Payer: Self-pay

## 2019-09-13 ENCOUNTER — Ambulatory Visit (INDEPENDENT_AMBULATORY_CARE_PROVIDER_SITE_OTHER): Payer: Medicare Other | Admitting: Emergency Medicine

## 2019-09-13 ENCOUNTER — Other Ambulatory Visit: Payer: Self-pay

## 2019-09-13 ENCOUNTER — Telehealth: Payer: Self-pay | Admitting: Emergency Medicine

## 2019-09-13 DIAGNOSIS — I495 Sick sinus syndrome: Secondary | ICD-10-CM

## 2019-09-13 NOTE — Progress Notes (Signed)
Video visit for pacemaker wound check due to patient not having transportation to [place]. Granddaughter assisted with video visit. Patient partially removed derm-bond prior to visit. Gen change was 09/05/19. No drainage, edema or redness at wound site. Wound edges appear approximated with possible exception of 2 mm area on medial aspect of incision. No drainage reported and no fever or chills. Patient instructed to wash wound with mild ant-bacterial  soap and warm water twice a day. Patient to call device clinic if she develops drainage, edema, or redness at wound site. She was instructed to call clinic if she develops a fever or chills and to seek treatment and assessment if a fever or chills develops after office hours or on the weekend.

## 2019-09-13 NOTE — Telephone Encounter (Signed)
Called patient left vm for her to send [place] a manual transmission from her home monitor per [facility]

## 2019-09-13 NOTE — Telephone Encounter (Signed)
Received call that patient unable to get transportation to [facility] for wound check s/p gen change on 09/05/19. Video wound check arranged with assistance by family for today at 56. Per patient to contact granddaughter at [phone]) [phone].

## 2019-09-16 NOTE — Telephone Encounter (Signed)
Scheduled for automatic transmission on 09/17/19 as manual transmission has not yet been received.

## 2019-09-17 ENCOUNTER — Ambulatory Visit: Payer: Medicare Other

## 2019-09-17 NOTE — Telephone Encounter (Signed)
Transmission received . Alert  of AFL/AF episode on 09/08/19 that was 4 hours and 8 minutes long. No OAC.

## 2019-09-17 NOTE — Telephone Encounter (Signed)
SCAF noteed

## 2019-09-24 ENCOUNTER — Emergency Department: Payer: Medicare Other

## 2019-09-24 ENCOUNTER — Telehealth: Payer: Self-pay | Admitting: Internal Medicine

## 2019-09-24 ENCOUNTER — Other Ambulatory Visit: Payer: Self-pay

## 2019-09-24 DIAGNOSIS — E119 Type 2 diabetes mellitus without complications: Secondary | ICD-10-CM | POA: Diagnosis present

## 2019-09-24 DIAGNOSIS — K219 Gastro-esophageal reflux disease without esophagitis: Secondary | ICD-10-CM | POA: Diagnosis present

## 2019-09-24 DIAGNOSIS — Z950 Presence of cardiac pacemaker: Secondary | ICD-10-CM

## 2019-09-24 DIAGNOSIS — Z951 Presence of aortocoronary bypass graft: Secondary | ICD-10-CM

## 2019-09-24 DIAGNOSIS — I272 Pulmonary hypertension, unspecified: Secondary | ICD-10-CM | POA: Diagnosis present

## 2019-09-24 DIAGNOSIS — I110 Hypertensive heart disease with heart failure: Secondary | ICD-10-CM | POA: Diagnosis not present

## 2019-09-24 DIAGNOSIS — I495 Sick sinus syndrome: Secondary | ICD-10-CM | POA: Diagnosis present

## 2019-09-24 DIAGNOSIS — I252 Old myocardial infarction: Secondary | ICD-10-CM

## 2019-09-24 DIAGNOSIS — Z833 Family history of diabetes mellitus: Secondary | ICD-10-CM

## 2019-09-24 DIAGNOSIS — Z7982 Long term (current) use of aspirin: Secondary | ICD-10-CM

## 2019-09-24 DIAGNOSIS — Z90710 Acquired absence of both cervix and uterus: Secondary | ICD-10-CM

## 2019-09-24 DIAGNOSIS — I25118 Atherosclerotic heart disease of native coronary artery with other forms of angina pectoris: Secondary | ICD-10-CM | POA: Diagnosis present

## 2019-09-24 DIAGNOSIS — Z9181 History of falling: Secondary | ICD-10-CM

## 2019-09-24 DIAGNOSIS — Z8249 Family history of ischemic heart disease and other diseases of the circulatory system: Secondary | ICD-10-CM

## 2019-09-24 DIAGNOSIS — Z79890 Hormone replacement therapy (postmenopausal): Secondary | ICD-10-CM

## 2019-09-24 DIAGNOSIS — I071 Rheumatic tricuspid insufficiency: Secondary | ICD-10-CM | POA: Diagnosis present

## 2019-09-24 DIAGNOSIS — I160 Hypertensive urgency: Secondary | ICD-10-CM | POA: Diagnosis present

## 2019-09-24 DIAGNOSIS — E876 Hypokalemia: Secondary | ICD-10-CM | POA: Diagnosis present

## 2019-09-24 DIAGNOSIS — Z8542 Personal history of malignant neoplasm of other parts of uterus: Secondary | ICD-10-CM

## 2019-09-24 DIAGNOSIS — Z7984 Long term (current) use of oral hypoglycemic drugs: Secondary | ICD-10-CM

## 2019-09-24 DIAGNOSIS — E8770 Fluid overload, unspecified: Secondary | ICD-10-CM | POA: Diagnosis not present

## 2019-09-24 DIAGNOSIS — E039 Hypothyroidism, unspecified: Secondary | ICD-10-CM | POA: Diagnosis present

## 2019-09-24 DIAGNOSIS — Z79899 Other long term (current) drug therapy: Secondary | ICD-10-CM

## 2019-09-24 DIAGNOSIS — T502X5A Adverse effect of carbonic-anhydrase inhibitors, benzothiadiazides and other diuretics, initial encounter: Secondary | ICD-10-CM | POA: Diagnosis present

## 2019-09-24 DIAGNOSIS — F0280 Dementia in other diseases classified elsewhere without behavioral disturbance: Secondary | ICD-10-CM | POA: Diagnosis present

## 2019-09-24 DIAGNOSIS — J9601 Acute respiratory failure with hypoxia: Secondary | ICD-10-CM | POA: Diagnosis present

## 2019-09-24 DIAGNOSIS — Z818 Family history of other mental and behavioral disorders: Secondary | ICD-10-CM

## 2019-09-24 DIAGNOSIS — D500 Iron deficiency anemia secondary to blood loss (chronic): Secondary | ICD-10-CM | POA: Diagnosis present

## 2019-09-24 DIAGNOSIS — Y92239 Unspecified place in hospital as the place of occurrence of the external cause: Secondary | ICD-10-CM | POA: Diagnosis present

## 2019-09-24 DIAGNOSIS — Z20822 Contact with and (suspected) exposure to covid-19: Secondary | ICD-10-CM | POA: Diagnosis present

## 2019-09-24 DIAGNOSIS — I480 Paroxysmal atrial fibrillation: Secondary | ICD-10-CM | POA: Diagnosis present

## 2019-09-24 DIAGNOSIS — G20 Parkinson's disease: Secondary | ICD-10-CM | POA: Diagnosis present

## 2019-09-24 DIAGNOSIS — R319 Hematuria, unspecified: Secondary | ICD-10-CM | POA: Diagnosis present

## 2019-09-24 DIAGNOSIS — E785 Hyperlipidemia, unspecified: Secondary | ICD-10-CM | POA: Diagnosis present

## 2019-09-24 DIAGNOSIS — Z955 Presence of coronary angioplasty implant and graft: Secondary | ICD-10-CM

## 2019-09-24 DIAGNOSIS — Z880 Allergy status to penicillin: Secondary | ICD-10-CM

## 2019-09-24 DIAGNOSIS — I5033 Acute on chronic diastolic (congestive) heart failure: Secondary | ICD-10-CM | POA: Diagnosis present

## 2019-09-24 DIAGNOSIS — F329 Major depressive disorder, single episode, unspecified: Secondary | ICD-10-CM | POA: Diagnosis present

## 2019-09-24 DIAGNOSIS — F419 Anxiety disorder, unspecified: Secondary | ICD-10-CM | POA: Diagnosis present

## 2019-09-24 LAB — BASIC METABOLIC PANEL
Anion gap: 10 (ref 5–15)
BUN: 16 mg/dL (ref 8–23)
CO2: 35 mmol/L — ABNORMAL HIGH (ref 22–32)
Calcium: 9.3 mg/dL (ref 8.9–10.3)
Chloride: 98 mmol/L (ref 98–111)
Creatinine, Ser: 0.98 mg/dL (ref 0.44–1.00)
GFR calc Af Amer: >60 mL/min (ref >60)
GFR calc non Af Amer: 54 mL/min — ABNORMAL LOW (ref >60)
Glucose, Bld: 173 mg/dL — ABNORMAL HIGH (ref 70–99)
Potassium: 3.4 mmol/L — ABNORMAL LOW (ref 3.5–5.1)
Sodium: 143 mmol/L (ref 135–145)

## 2019-09-24 LAB — CBC
HCT: 32.2 % — ABNORMAL LOW (ref 36.0–46.0)
Hemoglobin: 10.3 g/dL — ABNORMAL LOW (ref 12.0–15.0)
MCH: 30.7 pg (ref 26.0–34.0)
MCHC: 32 g/dL (ref 30.0–36.0)
MCV: 95.8 fL (ref 80.0–100.0)
Platelets: 177 10*3/uL (ref 150–400)
RBC: 3.36 MIL/uL — ABNORMAL LOW (ref 3.87–5.11)
RDW: 15.6 % — ABNORMAL HIGH (ref 11.5–15.5)
WBC: 4.5 10*3/uL (ref 4.0–10.5)
nRBC: 0 % (ref 0.0–0.2)

## 2019-09-24 LAB — BRAIN NATRIURETIC PEPTIDE: B Natriuretic Peptide: 104.7 pg/mL — ABNORMAL HIGH (ref 0.0–100.0)

## 2019-09-24 LAB — TROPONIN I (HIGH SENSITIVITY): Troponin I (High Sensitivity): 28 ng/L — ABNORMAL HIGH (ref <18)

## 2019-09-24 NOTE — Telephone Encounter (Signed)
I reviewed the patient's symptoms below with Dr. Caryl Comes. Per Dr. Caryl Comes, he cannot figure out why the patient is having as much swelling as she is reporting, especially since changing out her device.  Dr. Caryl Comes has advised that the patient take torsemide 60 mg in the morning (7/7) and follow up in the office with him on Thursday 09/26/19.  I have spoken with the patient regarding Dr. Olin Pia recommendations.  She is agreeable with taking torsemide 20 mg- 3 tablets (60 mg) on Wednesday morning 7/7.  The patient states she cannot come on 7/8 for follow up due to another doctors appointment and needing to give her transportation 5 days notice.  I have advised the patient we can see her on Tuesday 10/01/19 at 8:40 am.  She will notify her transportation. She is aware to go ahead and take torsemide 60 mg once daily tomorrow morning and I will need to follow up with her and Dr. Caryl Comes on Thursday about recommendations going forward until her appt on 7/13.  The patient voices understanding of the above recommendations and is agreeable.

## 2019-09-24 NOTE — Telephone Encounter (Signed)
Attempted to call the patient. No answer- I left a message to please call back.  

## 2019-09-24 NOTE — ED Triage Notes (Signed)
Pt arrives via [facility] from home per report with bilateral leg swelling  x 2 weeks, she has an appointment this week with her doctor, 148/80, hr 65, 95% RA. Ambulatory.

## 2019-09-24 NOTE — Telephone Encounter (Signed)
Patient returning call.

## 2019-09-24 NOTE — Telephone Encounter (Signed)
I spoke with the patient. She states that for the past 5 days, she has noticed a change in her swelling, both abdominal & lower extremity swelling that goes up to her thighs.  The patient does not weigh at home as she does not have a way to do so. She reports no change in her fluid or sodium intake over the last week.  She thinks her urine output is about normal for her. She does have some increased SOB with exertion.   The patient confirms she is taking torsemide 20 mg- 2 tablets (40 mg) once daily in the morning.   I have advised the patient I will review with the provider and call her call back. She voices understanding and is agreeable.   BMP 08/13/19: BUN/ Creatinine/ K+= 10/1.0/4.4 CBC 09/03/19: Hgb= 10.7  The patient sees Dr. Caryl Comes (EP) & Dr. Garen Lah (primary cards).

## 2019-09-24 NOTE — ED Triage Notes (Signed)
Pt to ED reporting bilateral leg swelng for multiple months that has worsened in the last week. Pt is 14 days out from a pacemaker being placed. Pt reporting SOB that has worsened in the last week as well.

## 2019-09-24 NOTE — Telephone Encounter (Signed)
Pt c/o swelling: STAT is pt has developed SOB within 24 hours  1) How much weight have you gained and in what time span? Weight fluctuates  2) If swelling, where is the swelling located? Abdomen, and bilateral legs, ankles and feet   3) Are you currently taking a fluid pill? yes  4) Are you currently SOB? no  5) Do you have a log of your daily weights (if so, list)? no  6) Have you gained 3 pounds in a day or 5 pounds in a week? yes  Have you traveled recently? no

## 2019-09-25 ENCOUNTER — Emergency Department: Payer: Medicare Other

## 2019-09-25 ENCOUNTER — Inpatient Hospital Stay
Admission: EM | Admit: 2019-09-25 | Discharge: 2019-09-30 | DRG: 291 | Disposition: A | Discharge status: Home Health | Payer: Medicare Other | Attending: Internal Medicine | Admitting: Internal Medicine

## 2019-09-25 DIAGNOSIS — I5033 Acute on chronic diastolic (congestive) heart failure: Secondary | ICD-10-CM

## 2019-09-25 DIAGNOSIS — I5032 Chronic diastolic (congestive) heart failure: Secondary | ICD-10-CM

## 2019-09-25 DIAGNOSIS — E039 Hypothyroidism, unspecified: Secondary | ICD-10-CM | POA: Diagnosis present

## 2019-09-25 DIAGNOSIS — F329 Major depressive disorder, single episode, unspecified: Secondary | ICD-10-CM

## 2019-09-25 DIAGNOSIS — I10 Essential (primary) hypertension: Secondary | ICD-10-CM | POA: Diagnosis present

## 2019-09-25 DIAGNOSIS — E876 Hypokalemia: Secondary | ICD-10-CM | POA: Diagnosis present

## 2019-09-25 DIAGNOSIS — I509 Heart failure, unspecified: Secondary | ICD-10-CM

## 2019-09-25 DIAGNOSIS — I160 Hypertensive urgency: Secondary | ICD-10-CM | POA: Diagnosis present

## 2019-09-25 DIAGNOSIS — E8770 Fluid overload, unspecified: Secondary | ICD-10-CM | POA: Diagnosis present

## 2019-09-25 DIAGNOSIS — F32A Depression, unspecified: Secondary | ICD-10-CM | POA: Diagnosis present

## 2019-09-25 DIAGNOSIS — K219 Gastro-esophageal reflux disease without esophagitis: Secondary | ICD-10-CM | POA: Diagnosis present

## 2019-09-25 DIAGNOSIS — F419 Anxiety disorder, unspecified: Secondary | ICD-10-CM | POA: Diagnosis not present

## 2019-09-25 DIAGNOSIS — I2510 Atherosclerotic heart disease of native coronary artery without angina pectoris: Secondary | ICD-10-CM | POA: Diagnosis present

## 2019-09-25 DIAGNOSIS — I25118 Atherosclerotic heart disease of native coronary artery with other forms of angina pectoris: Secondary | ICD-10-CM | POA: Diagnosis not present

## 2019-09-25 DIAGNOSIS — E119 Type 2 diabetes mellitus without complications: Secondary | ICD-10-CM

## 2019-09-25 DIAGNOSIS — I4891 Unspecified atrial fibrillation: Secondary | ICD-10-CM

## 2019-09-25 DIAGNOSIS — R0600 Dyspnea, unspecified: Secondary | ICD-10-CM

## 2019-09-25 LAB — TROPONIN I (HIGH SENSITIVITY)
Troponin I (High Sensitivity): 31 ng/L — ABNORMAL HIGH (ref <18)
Troponin I (High Sensitivity): 32 ng/L — ABNORMAL HIGH (ref <18)
Troponin I (High Sensitivity): 33 ng/L — ABNORMAL HIGH (ref <18)

## 2019-09-25 LAB — GLUCOSE, CAPILLARY
Glucose-Capillary: 182 mg/dL — ABNORMAL HIGH (ref 70–99)
Glucose-Capillary: 188 mg/dL — ABNORMAL HIGH (ref 70–99)
Glucose-Capillary: 156 mg/dL — ABNORMAL HIGH (ref 70–99)

## 2019-09-25 LAB — SARS CORONAVIRUS 2 BY RT PCR (HOSPITAL ORDER, PERFORMED IN ~~LOC~~ HOSPITAL LAB): SARS Coronavirus 2: NEGATIVE

## 2019-09-25 LAB — TSH: TSH: 5.933 u[IU]/mL — ABNORMAL HIGH (ref 0.350–4.500)

## 2019-09-25 MED ORDER — ASPIRIN EC 81 MG PO TBEC
81.0000 mg | DELAYED_RELEASE_TABLET | Freq: Every day | ORAL | Status: DC
  Administered 2019-09-26: 81 mg via ORAL
  Filled 2019-09-25: qty 1

## 2019-09-25 MED ORDER — ONDANSETRON HCL 4 MG PO TABS: 4.0000 mg | ORAL_TABLET | Freq: Four times a day (QID) | ORAL | Status: DC | PRN

## 2019-09-25 MED ORDER — ENOXAPARIN SODIUM 40 MG/0.4ML ~~LOC~~ SOLN
40.0000 mg | SUBCUTANEOUS | Status: DC
  Administered 2019-09-26 – 2019-09-27 (×2): 40 mg via SUBCUTANEOUS
  Filled 2019-09-25 (×2): qty 0.4

## 2019-09-25 MED ORDER — ACETAMINOPHEN 650 MG RE SUPP: 650.0000 mg | Freq: Four times a day (QID) | RECTAL | Status: DC | PRN

## 2019-09-25 MED ORDER — PANTOPRAZOLE SODIUM 40 MG PO TBEC
DELAYED_RELEASE_TABLET | ORAL | Status: AC
  Administered 2019-09-25: 40 mg via ORAL
  Filled 2019-09-25: qty 1

## 2019-09-25 MED ORDER — INSULIN ASPART 100 UNIT/ML ~~LOC~~ SOLN
0.0000 [IU] | Freq: Three times a day (TID) | SUBCUTANEOUS | Status: DC
  Administered 2019-09-25 (×2): 3 [IU] via SUBCUTANEOUS
  Administered 2019-09-26 – 2019-09-27 (×2): 5 [IU] via SUBCUTANEOUS
  Administered 2019-09-27 – 2019-09-28 (×2): 3 [IU] via SUBCUTANEOUS
  Administered 2019-09-28: 5 [IU] via SUBCUTANEOUS
  Administered 2019-09-28: 2 [IU] via SUBCUTANEOUS
  Administered 2019-09-29 – 2019-09-30 (×4): 3 [IU] via SUBCUTANEOUS
  Administered 2019-09-30: 2 [IU] via SUBCUTANEOUS
  Filled 2019-09-25 (×13): qty 1

## 2019-09-25 MED ORDER — LORATADINE 10 MG PO TABS
ORAL_TABLET | ORAL | Status: AC
  Administered 2019-09-25: 10 mg via ORAL
  Filled 2019-09-25: qty 1

## 2019-09-25 MED ORDER — TORSEMIDE 20 MG PO TABS: 40.0000 mg | ORAL_TABLET | Freq: Every day | ORAL | Status: DC

## 2019-09-25 MED ORDER — HYDROCHLOROTHIAZIDE 25 MG PO TABS
ORAL_TABLET | ORAL | Status: AC
  Filled 2019-09-25: qty 1

## 2019-09-25 MED ORDER — LEVOTHYROXINE SODIUM 100 MCG PO TABS
200.0000 ug | ORAL_TABLET | Freq: Every day | ORAL | Status: DC
  Administered 2019-09-26 – 2019-09-30 (×5): 200 ug via ORAL
  Filled 2019-09-25 (×5): qty 2

## 2019-09-25 MED ORDER — GABAPENTIN 300 MG PO CAPS: 300.0000 mg | ORAL_CAPSULE | ORAL | Status: DC

## 2019-09-25 MED ORDER — DOCUSATE SODIUM 100 MG PO CAPS
100.0000 mg | ORAL_CAPSULE | Freq: Every day | ORAL | Status: DC
  Administered 2019-09-26 – 2019-09-30 (×5): 100 mg via ORAL
  Filled 2019-09-25 (×5): qty 1

## 2019-09-25 MED ORDER — OXYCODONE-ACETAMINOPHEN 5-325 MG PO TABS
1.0000 | ORAL_TABLET | Freq: Once | ORAL | Status: AC
  Administered 2019-09-25: 1 via ORAL
  Filled 2019-09-25: qty 1

## 2019-09-25 MED ORDER — NITROGLYCERIN 2 % TD OINT
1.0000 [in_us] | TOPICAL_OINTMENT | Freq: Once | TRANSDERMAL | Status: AC
  Administered 2019-09-25: 1 [in_us] via TOPICAL
  Filled 2019-09-25: qty 1

## 2019-09-25 MED ORDER — DOCUSATE SODIUM 100 MG PO CAPS
ORAL_CAPSULE | ORAL | Status: AC
  Administered 2019-09-25: 100 mg via ORAL
  Filled 2019-09-25: qty 1

## 2019-09-25 MED ORDER — SODIUM CHLORIDE 0.9 % IV SOLN
250.0000 mL | INTRAVENOUS | Status: DC | PRN
  Administered 2019-09-26: 250 mL via INTRAVENOUS

## 2019-09-25 MED ORDER — ACETAMINOPHEN 325 MG PO TABS
650.0000 mg | ORAL_TABLET | Freq: Four times a day (QID) | ORAL | Status: DC | PRN
  Administered 2019-09-26 – 2019-09-30 (×4): 650 mg via ORAL
  Filled 2019-09-25 (×4): qty 2

## 2019-09-25 MED ORDER — OMEGA-3-ACID ETHYL ESTERS 1 G PO CAPS
1.0000 g | ORAL_CAPSULE | Freq: Every day | ORAL | Status: DC
  Administered 2019-09-25 – 2019-09-30 (×6): 1 g via ORAL
  Filled 2019-09-25 (×7): qty 1

## 2019-09-25 MED ORDER — FUROSEMIDE 10 MG/ML IJ SOLN
40.0000 mg | Freq: Two times a day (BID) | INTRAMUSCULAR | Status: DC
  Administered 2019-09-25 – 2019-09-27 (×4): 40 mg via INTRAVENOUS
  Filled 2019-09-25 (×4): qty 4

## 2019-09-25 MED ORDER — OLANZAPINE 2.5 MG PO TABS
2.5000 mg | ORAL_TABLET | Freq: Two times a day (BID) | ORAL | Status: DC
  Administered 2019-09-25 – 2019-09-30 (×11): 2.5 mg via ORAL
  Filled 2019-09-25 (×12): qty 1

## 2019-09-25 MED ORDER — ADULT MULTIVITAMIN W/MINERALS CH
1.0000 | ORAL_TABLET | Freq: Every day | ORAL | Status: DC
  Administered 2019-09-26 – 2019-09-30 (×5): 1 via ORAL
  Filled 2019-09-25 (×5): qty 1

## 2019-09-25 MED ORDER — ASPIRIN EC 81 MG PO TBEC
DELAYED_RELEASE_TABLET | ORAL | Status: AC
  Administered 2019-09-25: 81 mg via ORAL
  Filled 2019-09-25: qty 1

## 2019-09-25 MED ORDER — SODIUM CHLORIDE 0.9% FLUSH: 3.0000 mL | INTRAVENOUS | Status: DC | PRN

## 2019-09-25 MED ORDER — HYDRALAZINE HCL 50 MG PO TABS
50.0000 mg | ORAL_TABLET | Freq: Two times a day (BID) | ORAL | Status: DC
  Administered 2019-09-25 – 2019-09-27 (×4): 50 mg via ORAL
  Filled 2019-09-25 (×4): qty 1

## 2019-09-25 MED ORDER — SERTRALINE HCL 50 MG PO TABS
ORAL_TABLET | ORAL | Status: AC
  Filled 2019-09-25: qty 2

## 2019-09-25 MED ORDER — POTASSIUM CHLORIDE CRYS ER 20 MEQ PO TBCR
20.0000 meq | EXTENDED_RELEASE_TABLET | Freq: Two times a day (BID) | ORAL | Status: DC
  Administered 2019-09-25 – 2019-09-30 (×10): 20 meq via ORAL
  Filled 2019-09-25 (×10): qty 1

## 2019-09-25 MED ORDER — CHOLESTYRAMINE 4 G PO PACK
4.0000 g | PACK | Freq: Every day | ORAL | Status: DC
  Administered 2019-09-26 – 2019-09-30 (×5): 4 g via ORAL
  Filled 2019-09-25 (×6): qty 1

## 2019-09-25 MED ORDER — PANTOPRAZOLE SODIUM 40 MG PO TBEC
40.0000 mg | DELAYED_RELEASE_TABLET | Freq: Every day | ORAL | Status: DC
  Administered 2019-09-26 – 2019-09-30 (×5): 40 mg via ORAL
  Filled 2019-09-25 (×5): qty 1

## 2019-09-25 MED ORDER — ENOXAPARIN SODIUM 40 MG/0.4ML ~~LOC~~ SOLN
SUBCUTANEOUS | Status: AC
  Administered 2019-09-25: 40 mg via SUBCUTANEOUS
  Filled 2019-09-25: qty 0.4

## 2019-09-25 MED ORDER — PROMETHAZINE HCL 25 MG/ML IJ SOLN
12.5000 mg | Freq: Once | INTRAMUSCULAR | Status: AC
  Administered 2019-09-26: 12.5 mg via INTRAVENOUS
  Filled 2019-09-25: qty 1

## 2019-09-25 MED ORDER — LORATADINE 10 MG PO TABS
10.0000 mg | ORAL_TABLET | Freq: Every day | ORAL | Status: DC
  Administered 2019-09-26 – 2019-09-30 (×5): 10 mg via ORAL
  Filled 2019-09-25 (×5): qty 1

## 2019-09-25 MED ORDER — HYDROXYZINE HCL 25 MG PO TABS
ORAL_TABLET | ORAL | Status: AC
  Administered 2019-09-25: 25 mg via ORAL
  Filled 2019-09-25: qty 1

## 2019-09-25 MED ORDER — GABAPENTIN 300 MG PO CAPS
600.0000 mg | ORAL_CAPSULE | Freq: Every day | ORAL | Status: DC
  Administered 2019-09-25: 600 mg via ORAL
  Administered 2019-09-26: 300 mg via ORAL
  Administered 2019-09-26 – 2019-09-29 (×4): 600 mg via ORAL
  Filled 2019-09-25 (×5): qty 2

## 2019-09-25 MED ORDER — MONTELUKAST SODIUM 10 MG PO TABS
10.0000 mg | ORAL_TABLET | Freq: Every day | ORAL | Status: DC
  Administered 2019-09-25 – 2019-09-29 (×5): 10 mg via ORAL
  Filled 2019-09-25 (×5): qty 1

## 2019-09-25 MED ORDER — SODIUM CHLORIDE 0.9% FLUSH
3.0000 mL | Freq: Two times a day (BID) | INTRAVENOUS | Status: DC
  Administered 2019-09-25 – 2019-09-30 (×7): 3 mL via INTRAVENOUS

## 2019-09-25 MED ORDER — FUROSEMIDE 10 MG/ML IJ SOLN
40.0000 mg | Freq: Once | INTRAMUSCULAR | Status: AC
  Administered 2019-09-25: 40 mg via INTRAVENOUS
  Filled 2019-09-25: qty 4

## 2019-09-25 MED ORDER — ADULT MULTIVITAMIN W/MINERALS CH
ORAL_TABLET | ORAL | Status: AC
  Administered 2019-09-25: 1 via ORAL
  Filled 2019-09-25: qty 1

## 2019-09-25 MED ORDER — GABAPENTIN 300 MG PO CAPS
300.0000 mg | ORAL_CAPSULE | Freq: Every morning | ORAL | Status: DC
  Administered 2019-09-27 – 2019-09-30 (×4): 300 mg via ORAL
  Filled 2019-09-25 (×5): qty 1

## 2019-09-25 MED ORDER — VITAMIN D 25 MCG (1000 UNIT) PO TABS
1000.0000 [IU] | ORAL_TABLET | Freq: Every day | ORAL | Status: DC
  Administered 2019-09-26 – 2019-09-30 (×5): 1000 [IU] via ORAL
  Filled 2019-09-25 (×5): qty 1

## 2019-09-25 MED ORDER — TRAMADOL HCL 50 MG PO TABS
50.0000 mg | ORAL_TABLET | Freq: Three times a day (TID) | ORAL | Status: DC | PRN
  Administered 2019-09-25: 50 mg via ORAL
  Filled 2019-09-25: qty 1

## 2019-09-25 MED ORDER — POTASSIUM CHLORIDE CRYS ER 20 MEQ PO TBCR
EXTENDED_RELEASE_TABLET | ORAL | Status: AC
  Administered 2019-09-25: 20 meq via ORAL
  Filled 2019-09-25: qty 1

## 2019-09-25 MED ORDER — IOHEXOL 350 MG/ML SOLN
75.0000 mL | Freq: Once | INTRAVENOUS | Status: AC | PRN
  Administered 2019-09-25: 75 mL via INTRAVENOUS

## 2019-09-25 MED ORDER — SERTRALINE HCL 50 MG PO TABS
100.0000 mg | ORAL_TABLET | Freq: Two times a day (BID) | ORAL | Status: DC
  Administered 2019-09-25 – 2019-09-30 (×11): 100 mg via ORAL
  Filled 2019-09-25 (×10): qty 2

## 2019-09-25 MED ORDER — DONEPEZIL HCL 5 MG PO TABS
10.0000 mg | ORAL_TABLET | Freq: Every day | ORAL | Status: DC
  Administered 2019-09-26 – 2019-09-30 (×6): 10 mg via ORAL
  Filled 2019-09-25 (×5): qty 2

## 2019-09-25 MED ORDER — HYDROXYZINE HCL 25 MG PO TABS
25.0000 mg | ORAL_TABLET | Freq: Two times a day (BID) | ORAL | Status: DC
  Administered 2019-09-25 – 2019-09-30 (×10): 25 mg via ORAL
  Filled 2019-09-25 (×10): qty 1

## 2019-09-25 MED ORDER — VITAMIN E 180 MG (400 UNIT) PO CAPS
400.0000 [IU] | ORAL_CAPSULE | Freq: Every day | ORAL | Status: DC
  Administered 2019-09-25 – 2019-09-30 (×6): 400 [IU] via ORAL
  Filled 2019-09-25 (×6): qty 1

## 2019-09-25 MED ORDER — ROSUVASTATIN CALCIUM 10 MG PO TABS
40.0000 mg | ORAL_TABLET | Freq: Every day | ORAL | Status: DC
  Administered 2019-09-25 – 2019-09-30 (×6): 40 mg via ORAL
  Filled 2019-09-25 (×3): qty 4
  Filled 2019-09-25: qty 2
  Filled 2019-09-25: qty 4
  Filled 2019-09-25: qty 2
  Filled 2019-09-25: qty 4

## 2019-09-25 MED ORDER — NITROGLYCERIN 0.4 MG SL SUBL: 0.4000 mg | SUBLINGUAL_TABLET | SUBLINGUAL | Status: DC | PRN

## 2019-09-25 MED ORDER — DONEPEZIL HCL 5 MG PO TABS
ORAL_TABLET | ORAL | Status: AC
  Administered 2019-09-25: 10 mg via ORAL
  Filled 2019-09-25: qty 2

## 2019-09-25 MED ORDER — ONDANSETRON HCL 4 MG/2ML IJ SOLN
4.0000 mg | Freq: Four times a day (QID) | INTRAMUSCULAR | Status: DC | PRN
  Administered 2019-09-25: 4 mg via INTRAVENOUS
  Filled 2019-09-25: qty 2

## 2019-09-25 MED ORDER — VITAMIN B-12 1000 MCG PO TABS
1000.0000 ug | ORAL_TABLET | Freq: Every day | ORAL | Status: DC
  Administered 2019-09-25 – 2019-09-30 (×6): 1000 ug via ORAL
  Filled 2019-09-25 (×7): qty 1
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
## 2019-09-25 NOTE — Progress Notes (Signed)
Report called to [facility] RN 2a

## 2019-09-26 ENCOUNTER — Encounter: Payer: Self-pay | Admitting: Internal Medicine

## 2019-09-26 ENCOUNTER — Other Ambulatory Visit: Payer: Self-pay

## 2019-09-26 DIAGNOSIS — Z950 Presence of cardiac pacemaker: Secondary | ICD-10-CM | POA: Diagnosis not present

## 2019-09-26 DIAGNOSIS — G20 Parkinson's disease: Secondary | ICD-10-CM | POA: Diagnosis present

## 2019-09-26 DIAGNOSIS — J9601 Acute respiratory failure with hypoxia: Secondary | ICD-10-CM | POA: Diagnosis present

## 2019-09-26 DIAGNOSIS — Z20822 Contact with and (suspected) exposure to covid-19: Secondary | ICD-10-CM | POA: Diagnosis present

## 2019-09-26 DIAGNOSIS — I495 Sick sinus syndrome: Secondary | ICD-10-CM

## 2019-09-26 DIAGNOSIS — I110 Hypertensive heart disease with heart failure: Secondary | ICD-10-CM | POA: Diagnosis present

## 2019-09-26 DIAGNOSIS — E039 Hypothyroidism, unspecified: Secondary | ICD-10-CM | POA: Diagnosis present

## 2019-09-26 DIAGNOSIS — F0280 Dementia in other diseases classified elsewhere without behavioral disturbance: Secondary | ICD-10-CM | POA: Diagnosis present

## 2019-09-26 DIAGNOSIS — Z8542 Personal history of malignant neoplasm of other parts of uterus: Secondary | ICD-10-CM | POA: Diagnosis not present

## 2019-09-26 DIAGNOSIS — I5033 Acute on chronic diastolic (congestive) heart failure: Secondary | ICD-10-CM

## 2019-09-26 DIAGNOSIS — Z833 Family history of diabetes mellitus: Secondary | ICD-10-CM | POA: Diagnosis not present

## 2019-09-26 DIAGNOSIS — Z90710 Acquired absence of both cervix and uterus: Secondary | ICD-10-CM | POA: Diagnosis not present

## 2019-09-26 DIAGNOSIS — I160 Hypertensive urgency: Secondary | ICD-10-CM | POA: Diagnosis present

## 2019-09-26 DIAGNOSIS — Z818 Family history of other mental and behavioral disorders: Secondary | ICD-10-CM | POA: Diagnosis not present

## 2019-09-26 DIAGNOSIS — I25118 Atherosclerotic heart disease of native coronary artery with other forms of angina pectoris: Secondary | ICD-10-CM | POA: Diagnosis not present

## 2019-09-26 DIAGNOSIS — I252 Old myocardial infarction: Secondary | ICD-10-CM | POA: Diagnosis not present

## 2019-09-26 DIAGNOSIS — K219 Gastro-esophageal reflux disease without esophagitis: Secondary | ICD-10-CM | POA: Diagnosis present

## 2019-09-26 DIAGNOSIS — Z5181 Encounter for therapeutic drug level monitoring: Secondary | ICD-10-CM | POA: Diagnosis not present

## 2019-09-26 DIAGNOSIS — E8770 Fluid overload, unspecified: Secondary | ICD-10-CM | POA: Diagnosis present

## 2019-09-26 DIAGNOSIS — Z951 Presence of aortocoronary bypass graft: Secondary | ICD-10-CM | POA: Diagnosis not present

## 2019-09-26 DIAGNOSIS — I4891 Unspecified atrial fibrillation: Secondary | ICD-10-CM | POA: Diagnosis not present

## 2019-09-26 DIAGNOSIS — Z8249 Family history of ischemic heart disease and other diseases of the circulatory system: Secondary | ICD-10-CM | POA: Diagnosis not present

## 2019-09-26 DIAGNOSIS — Z880 Allergy status to penicillin: Secondary | ICD-10-CM | POA: Diagnosis not present

## 2019-09-26 DIAGNOSIS — E785 Hyperlipidemia, unspecified: Secondary | ICD-10-CM | POA: Diagnosis present

## 2019-09-26 DIAGNOSIS — F329 Major depressive disorder, single episode, unspecified: Secondary | ICD-10-CM | POA: Diagnosis present

## 2019-09-26 DIAGNOSIS — I5032 Chronic diastolic (congestive) heart failure: Secondary | ICD-10-CM | POA: Diagnosis not present

## 2019-09-26 DIAGNOSIS — Y92239 Unspecified place in hospital as the place of occurrence of the external cause: Secondary | ICD-10-CM | POA: Diagnosis present

## 2019-09-26 DIAGNOSIS — Z955 Presence of coronary angioplasty implant and graft: Secondary | ICD-10-CM | POA: Diagnosis not present

## 2019-09-26 DIAGNOSIS — I480 Paroxysmal atrial fibrillation: Secondary | ICD-10-CM | POA: Diagnosis present

## 2019-09-26 DIAGNOSIS — F419 Anxiety disorder, unspecified: Secondary | ICD-10-CM | POA: Diagnosis present

## 2019-09-26 DIAGNOSIS — E876 Hypokalemia: Secondary | ICD-10-CM | POA: Diagnosis present

## 2019-09-26 DIAGNOSIS — I509 Heart failure, unspecified: Secondary | ICD-10-CM

## 2019-09-26 DIAGNOSIS — I10 Essential (primary) hypertension: Secondary | ICD-10-CM | POA: Diagnosis not present

## 2019-09-26 LAB — GLUCOSE, CAPILLARY
Glucose-Capillary: 119 mg/dL — ABNORMAL HIGH (ref 70–99)
Glucose-Capillary: 214 mg/dL — ABNORMAL HIGH (ref 70–99)
Glucose-Capillary: 73 mg/dL (ref 70–99)
Glucose-Capillary: 142 mg/dL — ABNORMAL HIGH (ref 70–99)

## 2019-09-26 LAB — ALBUMIN: Albumin: 3.4 g/dL — ABNORMAL LOW (ref 3.5–5.0)

## 2019-09-26 LAB — CBC
HCT: 30.4 % — ABNORMAL LOW (ref 36.0–46.0)
Hemoglobin: 9.9 g/dL — ABNORMAL LOW (ref 12.0–15.0)
MCH: 30.5 pg (ref 26.0–34.0)
MCHC: 32.6 g/dL (ref 30.0–36.0)
MCV: 93.5 fL (ref 80.0–100.0)
Platelets: 170 10*3/uL (ref 150–400)
RBC: 3.25 MIL/uL — ABNORMAL LOW (ref 3.87–5.11)
RDW: 15.8 % — ABNORMAL HIGH (ref 11.5–15.5)
WBC: 3.9 10*3/uL — ABNORMAL LOW (ref 4.0–10.5)
nRBC: 0 % (ref 0.0–0.2)

## 2019-09-26 LAB — BASIC METABOLIC PANEL
Anion gap: 12 (ref 5–15)
BUN: 19 mg/dL (ref 8–23)
CO2: 32 mmol/L (ref 22–32)
Calcium: 9 mg/dL (ref 8.9–10.3)
Chloride: 96 mmol/L — ABNORMAL LOW (ref 98–111)
Creatinine, Ser: 0.96 mg/dL (ref 0.44–1.00)
GFR calc Af Amer: >60 mL/min (ref >60)
GFR calc non Af Amer: 55 mL/min — ABNORMAL LOW (ref >60)
Glucose, Bld: 140 mg/dL — ABNORMAL HIGH (ref 70–99)
Potassium: 3.7 mmol/L (ref 3.5–5.1)
Sodium: 140 mmol/L (ref 135–145)

## 2019-09-26 LAB — T4, FREE: Free T4: 0.64 ng/dL (ref 0.61–1.12)

## 2019-09-26 LAB — HEMOGLOBIN A1C
Hgb A1c MFr Bld: 8 % — ABNORMAL HIGH (ref 4.8–5.6)
Mean Plasma Glucose: 182.9 mg/dL

## 2019-09-26 MED ORDER — POTASSIUM CHLORIDE CRYS ER 20 MEQ PO TBCR
30.0000 meq | EXTENDED_RELEASE_TABLET | Freq: Once | ORAL | Status: AC
  Administered 2019-09-26: 30 meq via ORAL
  Filled 2019-09-26: qty 1

## 2019-09-26 NOTE — Consult Note (Addendum)
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 Consider urology consultation. Reached out to IM attending to inform him of patient reported hematuria. Continue to hold ASA pending anemia workup to rule out bleed.   Hypokalemia --Ordered potassium repletion with goal 4.0.  Continue potassium with diuresis. Check Mg.   HTN --BP elevated at presentation. No PTA antihypertensive. Continue current diuresis.   HLD, LDL goal < 70 --Continue statin.  Dementia --Consider SNF given report of dementia with patient living by self at home; therefore, unclear if medication compliance.    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 --Functioning normally.. --Continue to  follow with EP.  For questions or updates, please contact [facility] Please consult [email or web address] for contact info under     Signed, Arvil Chaco, PA-C  09/26/2019 2:39 PM

## 2019-09-26 NOTE — Plan of Care (Signed)
  Problem: Education: Goal: Knowledge of General Education information will improve Description: Including pain rating scale, medication(s)/side effects and non-pharmacologic comfort measures Outcome: Progressing   Problem: Nutrition: Goal: Adequate nutrition will be maintained Outcome: Progressing   Problem: Activity: Goal: Risk for activity intolerance will decrease Outcome: Progressing   Problem: Safety: Goal: Ability to remain free from injury will improve Outcome: Progressing   

## 2019-09-26 NOTE — Telephone Encounter (Signed)
Was reviewing the patient's chart to discuss further recommendations with Dr. Caryl Comes. Noted that the patient is currently admitted to [facility] since yesterday.  Dr. Caryl Comes advised he was aware as he was contacted about a consult. He advised Dr. Rockey Situ is to see the patient.   Dr. Caryl Comes made aware that I had already scheduled the patient for an office visit on 10/01/19 with him and that I will go ahead and leave that in place for her.  MD agreed.

## 2019-09-26 NOTE — Evaluation (Signed)
Physical Therapy Evaluation Patient Details Name: Kristi [name] MRN: [id number] DOB: 1941-01-11 Today's Date: 09/26/2019   History of Present Illness  Kristi [name] is a 79 y.o. female with medical history significant for chronic diastolic dysfunction CHF, hypertension who presents to the emergency room via EMS for evaluation of worsening lower extremity swelling for 2 weeks, increased abdominal girth and exertional shortness of breath.  Patient states that she has had symptoms for about 2 weeks and recently had a pacemaker generator change out.  She had contacted her primary care provider 1 day prior to admission and the dose of her diuretic was increased.  Per patient she has gained about 40 pounds in the last 7 months.  She denies any dietary indiscretion and states that she is compliant with her medications.  Clinical Impression  Patient received in bed, talking on phone to family. Patient reports she is feeling better than yesterday. She continues to be on supplemental O2 at 1.5 lpm. Does not use O2 at home. Patient performed bed mobility with mod independence, transfers with min guard. Ambulated a total of 45 feet with RW and min guard with one seated rest break. O2 sats dropped down to 86% with ambulation on room air. She will continue to benefit from skilled PT while here to improve strength, endurance and safety with mobility.      Follow Up Recommendations Home health PT;Supervision - Intermittent, Home Health RN for CHF management    Equipment Recommendations  None recommended by PT - needs scale at home   Recommendations for Other Services       Precautions / Restrictions Precautions Precautions: Fall Restrictions Weight Bearing Restrictions: No      Mobility  Bed Mobility Overal bed mobility: Modified Independent             General bed mobility comments: increased time, use of bed rails, no physical assist needed  Transfers Overall transfer level: Needs  assistance Equipment used: Rolling walker (2 wheeled) Transfers: Sit to/from Stand Sit to Stand: Min guard            Ambulation/Gait Ambulation/Gait assistance: Min guard Gait Distance (Feet): 45 Feet Assistive device: Rolling walker (2 wheeled) Gait Pattern/deviations: Step-through pattern;Wide base of support;Decreased stride length Gait velocity: WFL   General Gait Details: patient requires cues for staying close to walker. Ambulated on room air, saturations dropped to 86% with mobility.  Stairs            Wheelchair Mobility    Modified Rankin (Stroke Patients Only)       Balance Overall balance assessment: Needs assistance Sitting-balance support: Feet supported Sitting balance-Leahy Scale: Good Sitting balance - Comments: patient able to sit edge of bed without support   Standing balance support: Bilateral upper extremity supported;During functional activity Standing balance-Leahy Scale: Fair Standing balance comment: reliant on RW and supervision/min guard for safety                             Pertinent Vitals/Pain Pain Assessment: No/denies pain    Home Living Family/patient expects to be discharged to:: Private residence Living Arrangements: Alone Available Help at Discharge: Family;Available PRN/intermittently Type of Home: Mobile home Home Access: Ramped entrance     Home Layout: One level Home Equipment: Walker - 2 wheels      Prior Function Level of Independence: Independent with assistive device(s)         Comments: patient reports falls. Family involved in  care.     Hand Dominance        Extremity/Trunk Assessment   Upper Extremity Assessment Upper Extremity Assessment: Generalized weakness    Lower Extremity Assessment Lower Extremity Assessment: Generalized weakness    Cervical / Trunk Assessment Cervical / Trunk Assessment: Normal  Communication   Communication: HOH  Cognition Arousal/Alertness:  Awake/alert Behavior During Therapy: WFL for tasks assessed/performed Overall Cognitive Status: Within Functional Limits for tasks assessed                                        General Comments      Exercises     Assessment/Plan    PT Assessment Patient needs continued PT services  PT Problem List Decreased strength;Decreased mobility;Decreased activity tolerance;Decreased balance;Cardiopulmonary status limiting activity;Decreased safety awareness       PT Treatment Interventions DME instruction;Therapeutic activities;Gait training;Therapeutic exercise;Patient/family education;Functional mobility training;Balance training    PT Goals (Current goals can be found in the Care Plan section)  Acute Rehab PT Goals Patient Stated Goal: to return home, get fluid off PT Goal Formulation: With patient Time For Goal Achievement: 10/10/19 Potential to Achieve Goals: Good    Frequency Min 2X/week   Barriers to discharge Decreased caregiver support      Co-evaluation               AM-PAC PT "6 Clicks" Mobility  Outcome Measure Help needed turning from your back to your side while in a flat bed without using bedrails?: None Help needed moving from lying on your back to sitting on the side of a flat bed without using bedrails?: A Little Help needed moving to and from a bed to a chair (including a wheelchair)?: A Little Help needed standing up from a chair using your arms (e.g., wheelchair or bedside chair)?: A Little Help needed to walk in hospital room?: A Little Help needed climbing 3-5 steps with a railing? : A Lot 6 Click Score: 18    End of Session Equipment Utilized During Treatment: Gait belt;Oxygen Activity Tolerance: Patient tolerated treatment well Patient left: in chair;with call bell/phone within reach Nurse Communication: Mobility status PT Visit Diagnosis: Muscle weakness (generalized) (M62.81);Difficulty in walking, not elsewhere classified  (R26.2)    Time: [id number] PT Time Calculation (min) (ACUTE ONLY): 25 min   Charges:   PT Evaluation $PT Eval Moderate Complexity: 1 Mod PT Treatments $Gait Training: 8-22 mins        Tramaine Sauls, PT, GCS 09/26/19,2:45 PM
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
## 2019-09-26 NOTE — TOC Initial Note (Signed)
Transition of Care [facility]) - Initial/Assessment Note    Patient Details  Name: Kristi [name] MRN: [id number] Date of Birth: September 06, 1940  Transition of Care [facility]) CM/SW Contact:    Eileen Stanford, LCSW Phone Number: 09/26/2019, 3:23 PM  Clinical Narrative:  Pt is alert and oriented. Pt is agreeable to [facility]. Pt did not have a agency preference.   Pt does not have a scale--CSW will provide at bedside. Pt understands to weigh herself daily and notify MD is noted weight gain.   [facility] will service pt.                 Expected Discharge Plan: [place] Barriers to Discharge: Continued Medical Work up   Patient Goals and CMS Choice Patient states their goals for this hospitalization and ongoing recovery are:: to get better   Choice offered to / list presented to : Patient  Expected Discharge Plan and Services Expected Discharge Plan: [place] In-house Referral: Clinical Social Work   Post Acute Care Choice: [place] arrangements for the past 2 months: [place] Arranged: RN, PT [facility] Agency: [place] ([place]) Date HH Agency Contacted: 09/26/19 Time HH Agency Contacted: 1522 Representative spoke with at [place] Arrangements/Services Living arrangements for the past 2 months: [place] with:: Self Patient language and need for interpreter reviewed:: Yes Do you feel safe going back to the place where you live?: Yes      Need for Family Participation in Patient Care: Yes (Comment) Care giver support system in place?: Yes (comment)   Criminal Activity/Legal Involvement Pertinent to Current Situation/Hospitalization: No - Comment as needed  Activities of Daily Living Home Assistive Devices/Equipment: Walker (specify type), Raised toilet seat with rails ADL Screening (condition at time of admission) Patient's cognitive ability adequate to safely  complete daily activities?: Yes Is the patient deaf or have difficulty hearing?: No Does the patient have difficulty seeing, even when wearing glasses/contacts?: No Does the patient have difficulty concentrating, remembering, or making decisions?: No Patient able to express need for assistance with ADLs?: Yes Does the patient have difficulty dressing or bathing?: No Independently performs ADLs?: Yes (appropriate for developmental age) Does the patient have difficulty walking or climbing stairs?: Yes Weakness of Legs: None Weakness of Arms/Hands: None  Permission Sought/Granted Permission sought to share information with : Family Supports    Share Information with NAME: [name]  Permission granted to share info w AGENCY: [place] granted to share info w Relationship: Daughter     Emotional Assessment Appearance:: Appears stated age Attitude/Demeanor/Rapport: Engaged   Orientation: : Oriented to Situation, Oriented to  Time, Oriented to Place, Oriented to Self Alcohol / Substance Use: Not Applicable Psych Involvement: No (comment)  Admission diagnosis:  Fluid overload [E87.70] Dyspnea, unspecified type [R06.00] CHF (congestive heart failure) ([place]) [I50.9] Patient Active Problem List   Diagnosis Date Noted  . CHF (congestive heart failure) ([place]) 09/26/2019  . Chronic diastolic CHF (congestive heart failure) ([place]) 09/25/2019  . Hypertensive urgency 09/25/2019  . Fluid overload 09/25/2019  . Excessive gas 05/14/2019  . Generalized bloating 05/14/2019  . Rectal bleeding 05/14/2019  . Major depressive disorder, recurrent severe without psychotic features ([place]) 12/01/2018  . Chest pain 11/20/2018  . Difficulty walking 11/19/2018  .  Insomnia 11/15/2018  . Type 2 diabetes mellitus ([place]) 11/15/2018  . Goals of care, counseling/discussion 02/06/2018  . 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 Rectal mass 01/05/2018  . Leg pain, bilateral 12/06/2017  .  Lymphedema 07/10/2017  . Stricture and stenosis of esophagus   . Dysphagia   . Syncope and collapse 04/11/2017  . Mild dementia ([place]) 01/24/2017  . Lumbar facet osteoarthritis (Bilateral) 01/17/2017  . Acute postoperative pain 01/17/2017  . Hypokalemia 12/24/2016  . Anxiety 12/07/2016  . 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 Cerebrovascular accident ([place]) 12/07/2016  . Chronic depression 12/07/2016  . Essential hypertension 12/07/2016  . Hypothyroidism 12/07/2016  . Mixed hyperlipidemia 12/07/2016  . Myocardial infarction ([place]) 12/07/2016  . TIA (transient ischemic attack) 11/17/2016  . Parkinson disease ([place]) 10/27/2016  . Tremor 10/27/2016  . Chronic knee pain (B) (R>L) 10/19/2016  . Ischemic chest pain ([place]) 09/29/2016  . Abnormal CT scan, lumbar spine 09/08/2016  . Lumbar foraminal stenosis (multilevel) 09/08/2016  . Fracture of clavicle 08/03/2016  . Polyneuropathy 05/17/2016  . Neurogenic pain 04/12/2016  . Chronic lower extremity pain (Bilateral) (R>L) 04/12/2016  . Chronic lower extremity radicular pain (Primary Source of Pain) (Bilateral) (R>L) 04/12/2016  . Lower extremity weakness 04/12/2016  . Chronic low back pain (Secondary source of pain) (Bilateral) (R>L) 04/12/2016  . Chronic wrist pain (Left) (secondary to fracture) 04/12/2016  . Lumbar spondylosis 04/12/2016  . Chronic pain syndrome 04/11/2016  . Long term current use of opiate analgesic 04/11/2016  . Long term prescription opiate use 04/11/2016  . Opiate use 04/11/2016  . Long term prescription benzodiazepine use 04/11/2016  . Lumbar radiculopathy (Multilevel) (Bilateral) 05/07/2015  . Lumbar facet syndrome (Bilateral) (R>L) 08/21/2014  . 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 Sacroiliac joint dysfunction (Bilateral) 08/13/2014  . Frequent falls 06/04/2014  . Memory loss 06/04/2014  . Anxiety and depression 08/22/2013  . CAD (coronary artery disease) 08/22/2013  .  Therapeutic opioid-induced constipation (OIC) 08/22/2013  . GERD (gastroesophageal reflux disease) 08/22/2013   PCP:  Lorelee Market, MD Pharmacy:   [place], [address] [place] [address] Phone: [phone] Fax: [phone]  [place], [place] [address] [place] [address] Phone: [phone] Fax: [phone]  [place], [place] [address] [place] [address] Phone: [phone] Fax: [phone]     Social Determinants of Health (SDOH) Interventions    Readmission Risk Interventions No flowsheet data found.

## 2019-09-27 ENCOUNTER — Other Ambulatory Visit: Payer: Self-pay

## 2019-09-27 DIAGNOSIS — K219 Gastro-esophageal reflux disease without esophagitis: Secondary | ICD-10-CM

## 2019-09-27 DIAGNOSIS — I5032 Chronic diastolic (congestive) heart failure: Secondary | ICD-10-CM

## 2019-09-27 DIAGNOSIS — Z5181 Encounter for therapeutic drug level monitoring: Secondary | ICD-10-CM

## 2019-09-27 DIAGNOSIS — I4891 Unspecified atrial fibrillation: Secondary | ICD-10-CM

## 2019-09-27 DIAGNOSIS — D509 Iron deficiency anemia, unspecified: Secondary | ICD-10-CM

## 2019-09-27 DIAGNOSIS — I480 Paroxysmal atrial fibrillation: Secondary | ICD-10-CM

## 2019-09-27 LAB — CBC
HCT: 31.8 % — ABNORMAL LOW (ref 36.0–46.0)
Hemoglobin: 10.1 g/dL — ABNORMAL LOW (ref 12.0–15.0)
MCH: 30.8 pg (ref 26.0–34.0)
MCHC: 31.8 g/dL (ref 30.0–36.0)
MCV: 97 fL (ref 80.0–100.0)
Platelets: 174 10*3/uL (ref 150–400)
RBC: 3.28 MIL/uL — ABNORMAL LOW (ref 3.87–5.11)
RDW: 15.7 % — ABNORMAL HIGH (ref 11.5–15.5)
WBC: 4.2 10*3/uL (ref 4.0–10.5)
nRBC: 0 % (ref 0.0–0.2)

## 2019-09-27 LAB — BASIC METABOLIC PANEL
Anion gap: 11 (ref 5–15)
BUN: 24 mg/dL — ABNORMAL HIGH (ref 8–23)
CO2: 33 mmol/L — ABNORMAL HIGH (ref 22–32)
Calcium: 9.1 mg/dL (ref 8.9–10.3)
Chloride: 98 mmol/L (ref 98–111)
Creatinine, Ser: 0.96 mg/dL (ref 0.44–1.00)
GFR calc Af Amer: >60 mL/min (ref >60)
GFR calc non Af Amer: 55 mL/min — ABNORMAL LOW (ref >60)
Glucose, Bld: 142 mg/dL — ABNORMAL HIGH (ref 70–99)
Potassium: 4.2 mmol/L (ref 3.5–5.1)
Sodium: 142 mmol/L (ref 135–145)

## 2019-09-27 LAB — GLUCOSE, CAPILLARY
Glucose-Capillary: 102 mg/dL — ABNORMAL HIGH (ref 70–99)
Glucose-Capillary: 210 mg/dL — ABNORMAL HIGH (ref 70–99)
Glucose-Capillary: 172 mg/dL — ABNORMAL HIGH (ref 70–99)
Glucose-Capillary: 136 mg/dL — ABNORMAL HIGH (ref 70–99)

## 2019-09-27 LAB — HEPARIN LEVEL (UNFRACTIONATED): Heparin Unfractionated: 0.71 IU/mL — ABNORMAL HIGH (ref 0.30–0.70)

## 2019-09-27 LAB — BRAIN NATRIURETIC PEPTIDE: B Natriuretic Peptide: 101.6 pg/mL — ABNORMAL HIGH (ref 0.0–100.0)

## 2019-09-27 LAB — T3, FREE: T3, Free: 1.8 pg/mL — ABNORMAL LOW (ref 2.0–4.4)

## 2019-09-27 MED ORDER — AMIODARONE HCL IN DEXTROSE 360-4.14 MG/200ML-% IV SOLN
30.0000 mg/h | INTRAVENOUS | Status: DC
  Administered 2019-09-27 – 2019-09-29 (×4): 30 mg/h via INTRAVENOUS
  Filled 2019-09-27 (×3): qty 200

## 2019-09-27 MED ORDER — AMIODARONE HCL IN DEXTROSE 360-4.14 MG/200ML-% IV SOLN
60.0000 mg/h | INTRAVENOUS | Status: DC
  Administered 2019-09-27: 60 mg/h via INTRAVENOUS
  Filled 2019-09-27 (×2): qty 200

## 2019-09-27 MED ORDER — METOPROLOL TARTRATE 25 MG PO TABS
12.5000 mg | ORAL_TABLET | Freq: Two times a day (BID) | ORAL | Status: DC
  Administered 2019-09-27 – 2019-09-29 (×5): 12.5 mg via ORAL
  Filled 2019-09-27 (×6): qty 1

## 2019-09-27 MED ORDER — FUROSEMIDE 10 MG/ML IJ SOLN
40.0000 mg | Freq: Every day | INTRAMUSCULAR | Status: DC
  Administered 2019-09-28 – 2019-09-29 (×2): 40 mg via INTRAVENOUS
  Filled 2019-09-27 (×2): qty 4

## 2019-09-27 MED ORDER — HYDRALAZINE HCL 25 MG PO TABS: 25.0000 mg | ORAL_TABLET | Freq: Two times a day (BID) | ORAL | Status: DC

## 2019-09-27 MED ORDER — HEPARIN (PORCINE) 25000 UT/250ML-% IV SOLN
850.0000 [IU]/h | INTRAVENOUS | Status: DC
  Administered 2019-09-27: 1100 [IU]/h via INTRAVENOUS
  Administered 2019-09-28: 850 [IU]/h via INTRAVENOUS
  Filled 2019-09-27 (×2): qty 250

## 2019-09-27 MED ORDER — AMIODARONE LOAD VIA INFUSION
150.0000 mg | Freq: Once | INTRAVENOUS | Status: AC
  Administered 2019-09-27: 150 mg via INTRAVENOUS
  Filled 2019-09-27: qty 83.34

## 2019-09-27 NOTE — Progress Notes (Addendum)
Progress Note  Patient Name: Kristi [name] Date of Encounter: 09/27/2019  Primary Cardiologist: Kate Sable, MD   Subjective   Reports improved sx, notably her breathing and LEE. Still reports SOB and LEE though improved. Still on [place] oxygen but breathing improved. States today that she is going to get home oxygen per hospitalist/IM report.  States today she has not been weighed since admission that she can remember. Also stated that she has not walked at all since admission. PT note from yesterday shows ambulated with SpO2 drop to 86% ORA at that time. States today her memory is off with her decreased oxygen. She states she cannot recall things very well right now.   No CP, racing HR, palpitations. Denies being aware of elevated rates as seen on telemetry. States she does not have an allergy to BB to her knowledge.   CHF education discussed again. Low salt diet, daily weights. Discussed need for close follow-up.   Inpatient Medications    Scheduled Meds: . cholecalciferol  1,000 Units Oral Daily  . cholestyramine  4 g Oral Daily  . docusate sodium  100 mg Oral Daily  . donepezil  10 mg Oral Daily  . enoxaparin (LOVENOX) injection  40 mg Subcutaneous Q24H  . furosemide  40 mg Intravenous BID  . gabapentin  300 mg Oral q morning - 10a  . gabapentin  600 mg Oral QHS  . hydrALAZINE  50 mg Oral BID  . hydrOXYzine  25 mg Oral BID  . insulin aspart  0-15 Units Subcutaneous TID WC  . levothyroxine  200 mcg Oral Daily  . loratadine  10 mg Oral Daily  . montelukast  10 mg Oral QHS  . multivitamin with minerals  1 tablet Oral Daily  . OLANZapine  2.5 mg Oral BID AC & HS  . omega-3 acid ethyl esters  1 g Oral Daily  . pantoprazole  40 mg Oral Daily  . potassium chloride SA  20 mEq Oral BID  . rosuvastatin  40 mg Oral Daily  . sertraline  100 mg Oral BID  . sodium chloride flush  3 mL Intravenous Q12H  . vitamin B-12  1,000 mcg Oral Daily  . vitamin E  400 Units Oral Daily    Continuous Infusions: . sodium chloride 250 mL (09/26/19 0111)   PRN Meds: sodium chloride, acetaminophen **OR** acetaminophen, nitroGLYCERIN, ondansetron **OR** ondansetron (ZOFRAN) IV, sodium chloride flush, traMADol   Vital Signs    Vitals:   09/26/19 1641 09/26/19 1944 09/27/19 0445 09/27/19 0749  BP: (!) 117/59 135/68 128/83 (!) 110/48  Pulse: 60 61 (!) 114 77  Resp: 18   16  Temp: 97.7 F (36.5 C) 98.1 F (36.7 C) 97.8 F (36.6 C) 98 F (36.7 C)  TempSrc:  Oral Oral   SpO2: 94% 98% 97% 100%  Weight:   90.6 kg   Height:        Intake/Output Summary (Last 24 hours) at 09/27/2019 1156 Last data filed at 09/27/2019 0944 Gross per 24 hour  Intake 720 ml  Output 1850 ml  Net -1130 ml   Last 3 Weights 09/27/2019 09/26/2019 09/26/2019  Weight (lbs) 199 lb 11.2 oz 198 lb 10.2 oz 197 lb 8 oz  Weight (kg) 90.583 kg 90.1 kg 89.585 kg      Telemetry    Paced rhythm (mostly atrial), 70-114 bpm - Personally Reviewed  ECG    No new tracings - Personally Reviewed  Physical Exam   GEN: No acute  distress.   Neck: JVD difficult to assess due to body habitus Cardiac: RRR, 2/6 systolic murmur. No rubs or gallops.  Respiratory: Clear to auscultation bilaterally. GI: Soft, nontender, non-distended  MS: moderate / improved bilateral LEE; No deformity. Neuro:  Nonfocal, known memory issues Psych: Normal affect   Labs    High Sensitivity Troponin:   Recent Labs  Lab 09/24/19 2057 09/24/19 2347 09/25/19 0608 09/25/19 0821  TROPONINIHS 28* 32* 31* 33*      Chemistry Recent Labs  Lab 09/24/19 2057 09/26/19 0717 09/27/19 0534  NA 143 140 142  K 3.4* 3.7 4.2  CL 98 96* 98  CO2 35* 32 33*  GLUCOSE 173* 140* 142*  BUN 16 19 24*  CREATININE 0.98 0.96 0.96  CALCIUM 9.3 9.0 9.1  ALBUMIN  --  3.4*  --   GFRNONAA 54* 55* 55*  GFRAA >60 >60 >60  ANIONGAP 10 12 11      Hematology Recent Labs  Lab 09/24/19 2057 09/26/19 0717 09/27/19 0534  WBC 4.5 3.9* 4.2  RBC  3.36* 3.25* 3.28*  HGB 10.3* 9.9* 10.1*  HCT 32.2* 30.4* 31.8*  MCV 95.8 93.5 97.0  MCH 30.7 30.5 30.8  MCHC 32.0 32.6 31.8  RDW 15.6* 15.8* 15.7*  PLT 177 170 174    BNP Recent Labs  Lab 09/24/19 2058 09/27/19 0534  BNP 104.7* 101.6*     DDimer No results for input(s): DDIMER in the last 168 hours.   Radiology    No results found.  Cardiac Studies   Echo 08/12/19 1. Left ventricular ejection fraction, by estimation, is 50 to 55%. The  left ventricle has low normal function. Left ventricular endocardial  border not optimally defined to evaluate regional wall motion. There is  mild left ventricular hypertrophy. Left  ventricular diastolic parameters are indeterminate.  2. Right ventricular systolic function is normal. The right ventricular  size is normal. There is moderately elevated pulmonary artery systolic  pressure.  3. Left atrial size was severely dilated.  4. Right atrial size was mildly dilated.  5. The mitral valve is normal in structure. Moderate mitral valve  regurgitation. No evidence of mitral stenosis.  6. Tricuspid valve regurgitation is severe.  7. The aortic valve is normal in structure. Aortic valve regurgitation is  trivial. Mild aortic valve sclerosis is present, with no evidence of  aortic valve stenosis.  8. Moderately dilated pulmonary artery.  9. The inferior vena cava is normal in size with <50% respiratory  variability, suggesting right atrial pressure of 8 mmHg.  Comparison(s): Previous Echo showed LV EF 50-55%, no RWMA, moderately  increased RV wall thickness, trivial AI, moderate LAE.   Medtronic PPM change-out 09/05/19 Preoperative diagnosis Battery approaching ERI CHF atrial and ventricular lead insertion reversal Procedure: Generator replacement ; pocket revision ; lead repair Interrogation of the previously implanted ventricular lead St Jude demonstrated an R wave of 14 millivolts., and impedance of 608 ohms, and a  pacing threshold of 0.3 volts at 0.5 msec. This LEAD WAS REMOVED FROM THE ATRIAL PORT The previously implanted atrial lead St Jude demonstrated a P-wave amplitude of 9 milllivolts and impedance of 493 ohms, and a pacing threshold of 0.4 volts at @ 0.64milliseconds. Repair was needed with heme discoloration and the lead was optim covered and medical adhesive was applied The leads were inspected. The leads were then attached to a Medtronic pulse generator, serial number [id number] Following insertion of the device back in the pocket changes in the atrial lead were  further noted 1) Bipolar impedance [id number]--- lead reprogramed unipolar pacing impedance 456  2) threshold bip 0.5@ 0.4 >>1.25 @ 0.5 bip ---- 0.75 @ 1.0. unipolar Pocket was revised to allow for medial translocation of the device  Patient Profile     79 y.o. female  with a hx of HFpEF, lymphedema, CAD s/p prior CABG x3 over 15 years prior and interval stenting with PCI x5 stents, Medtronic PPM 2008 with lead revision and generator change in 2014 and 09/05/2019, first-degree AV block, DM2, HTN, HLD, dementia (lives by herself), and who is being seen today for HFpEF.   Assessment & Plan    HFpEF Pulmonary HTN --Reports improved shortness of breath.  Remains on 1L [place] oxygen with SpO2 98%. Does not use oxygen at home. Imaging without evidence of pulmonary edema or PE as above. Echo as above with EF low normal and elevated PASP.  --Likely multifactorial etiology of SOB. Not significantly volume overloaded on exam, though difficult to assess as limited by body habitus and comorbid conditions. Consider that lymphedema, hypoalbuminemia, and anemia (with pt reported hematuria as below) likely contributing to LEE. Also consider recent tachycardic rates at times. Unknown medication compliance with diuresis, given known dementia and lives alone. Net -1.7L yesterday. Net -950cc today. Wt stable at 90kg. Daily BMET. Cr stable. --Currently on IV  Lasix 40 mg twice daily back with recommendation to transition to oral diuresis before discharge. PTA diuresis was recently escalated in the outpatient with torsemide 40mg   Torsemide 60mg  daily but she did not yet increase home dose to 60mg  before admission.  --Could consider discharge on torsemide 60mg  daily with BMET in 1 week. Would also need associated KCl supplementation.  --Given brief runs of tachycardia, recommend addition of BB with decrease +/-  discontinuation of hydralazine to allow for room in pressure. Given PPM, could escalate BB as needed for control of brief tachycardic rates. Will start low dose metoprolol and decrease dose of hydralazine.  --Recommend consider regular HH or SNF given memory issues and unknown medication compliance. Also, could consider blister pack medication once able to pin down the dose of diuresis. --Recommend ambulate and monitor oxygen saturation before discharge to see if she will qualify for home oxygen. On review of EMR, appears O2 dropped to 80s on ambulation yesterday; therefore, she may already qualify.  --Will need follow-up and labs scheduled in the office. Recommend also schedule follow-up with EP.  CAD s/p CABG x3 and PCI x5 --No chest pain.  Reports a remote history of CABG and PCI as above in HPI.  EKG without acute ST/T changes.  High-sensitivity troponin minimally elevated and flat trending.  Not consistent with ACS.  No indication for IV heparin. Recommend consider workup of pt reported hematuria before restart ASA 81 mg.  Continue current risk factor modification with statin.  Anemia, unknown etiology Hematuria, patient reported --Consider workup of pt reported hematuria / anemia before restart ASA. Reports months of hematuria. Consider as contributing to her sx as above. CBC shows Hgb/Hct stable from yesterday's labs.  Hypokalemia, resolved --Will likely need discharge with KCl tab to prevent risk of arrhythmia with diuresis due to  electrolyte abnormalities.   Abnormal TSH --TSH 5.9333. FT4 wnl at 0.64. FT3 1.8. Workup / tx  per IM.   HTN --BP controlled and improved with diuresis and hydralazine. No PTA antihypertensive. As above, given tachycardia at time, added low dose BB to up-titrate and decreased hydralazine dose to allow for room in BP and better rate  control. Continue current diuresis.   HLD, LDL goal < 70 --Continue statin.  ?Dementia ?Medication compliance --Consider SNF given report of dementia with patient living by self at home; therefore, unclear if medication compliance. This has been noted at past cardiology visits.   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 --Functioning normally. Atrial pacing 80-90% of the time.  --Maintain electrolytes at goal.  --Continue to follow with EP.  DM2 --A1C 8.0. Not well controlled. SSI, per IM. F/u with PCP.   For questions or updates, please contact [place] Please consult [email or web address] for contact info under        Signed, Arvil Chaco, PA-C  09/27/2019, 11:56 AM
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
## 2019-09-27 NOTE — Consult Note (Signed)
[place] for Heparin Indication: atrial fibrillation  Allergies  Allergen Reactions  . Penicillins Anaphylaxis, Swelling and Rash    Patient Measurements: Height: 5\' 8"  (172.7 cm) Weight: 90.6 kg (199 lb 11.2 oz) IBW/kg (Calculated) : 63.9 Heparin Dosing Weight: 82.8 kg  Vital Signs: Temp: 97.6 F (36.4 C) (07/09 1210) Temp Source: Oral (07/09 0445) BP: 109/66 (07/09 1210) Pulse Rate: 80 (07/09 1210)  Labs: Recent Labs    09/24/19 2057 09/24/19 2057 09/24/19 2347 09/25/19 2703 09/25/19 0821 09/26/19 0717 09/27/19 0534  HGB 10.3*   < >  --   --   --  9.9* 10.1*  HCT 32.2*  --   --   --   --  30.4* 31.8*  PLT 177  --   --   --   --  170 174  CREATININE 0.98  --   --   --   --  0.96 0.96  TROPONINIHS 28*   < > 32* 31* 33*  --   --    < > = values in this interval not displayed.    Estimated Creatinine Clearance: 54.1 mL/min (by C-G formula based on SCr of 0.96 mg/dL).   Medical History: Past Medical History:  Diagnosis Date  . Anxiety   . Arthritis   . Broken arm    left FA 3/17  . Broken arm    left  . CAD (coronary artery disease)    s/p MI  . Cancer ([place])    uterine  . Cervical disc disorder   . Depression   . Diabetes mellitus without complication ([place])   . GERD (gastroesophageal reflux disease)   . Headache   . Hyperlipidemia   . Hypertension   . Hypothyroid   . Myocardial infarction ([place])    15 year ago  . Neck pain 06/04/2014  . Parkinson's disease ([place])   . Presence of permanent cardiac pacemaker   . Spinal stenosis     Medications:  Medications Prior to Admission  Medication Sig Dispense Refill Last Dose  . aspirin EC 81 MG tablet Take 81 mg by mouth daily.   09/24/2019 at 0800  . cholecalciferol (VITAMIN D3) 25 MCG (1000 UNIT) tablet Take 1,000 Units by mouth daily.   09/24/2019 at 0800  . cholestyramine (QUESTRAN) 4 g packet One packet daily in water (Patient taking differently: Take 4 g by mouth  daily. in water) 30 each 11 09/24/2019 at 0800  . gabapentin (NEURONTIN) 300 MG capsule Take 300-600 mg by mouth See admin instructions. Take 300 mg in the morning and 600 mg at night   09/24/2019 at 0800  . hydrOXYzine (ATARAX/VISTARIL) 25 MG tablet Take 25 mg by mouth 2 (two) times daily.   09/24/2019 at 0800  . levothyroxine (SYNTHROID) 200 MCG tablet Take 200 mcg by mouth daily.    09/24/2019 at 0700  . LINZESS 72 MCG capsule Take 72 mcg by mouth every morning.   09/24/2019 at 0800  . loratadine (CLARITIN) 10 MG tablet Take 10 mg by mouth daily.    09/24/2019 at 0800  . meclizine (ANTIVERT) 12.5 MG tablet Take 12.5 mg by mouth 2 (two) times daily.    09/24/2019 at 0800  . Multiple Vitamin (MULTIVITAMIN WITH MINERALS) TABS tablet Take 1 tablet by mouth daily.   09/24/2019 at 0800  . OLANZapine (ZYPREXA) 2.5 MG tablet Take 2.5 mg by mouth in the morning and at bedtime.    09/24/2019 at 0800  . Omega-3  Fatty Acids (FISH OIL) 1000 MG CAPS Take 1,000 mg by mouth daily.   09/24/2019 at 0800  . omeprazole (PRILOSEC) 40 MG capsule Take 40 mg by mouth 2 (two) times daily.   09/24/2019 at 0800  . pioglitazone (ACTOS) 30 MG tablet Take 30 mg by mouth daily.    09/24/2019 at 0800  . potassium chloride SA (K-DUR,KLOR-CON) 20 MEQ tablet Take 20 mEq by mouth 2 (two) times daily.    09/24/2019 at 0800  . rosuvastatin (CRESTOR) 40 MG tablet Take 40 mg by mouth daily.   09/24/2019 at 0800  . sertraline (ZOLOFT) 100 MG tablet Take 100 mg by mouth 2 (two) times daily.   09/24/2019 at 0800  . torsemide (DEMADEX) 20 MG tablet Take 2 tablets (40 mg) by mouth once daily (Patient taking differently: Take 40 mg by mouth daily. ) 60 tablet 6 09/24/2019 at 0800  . traMADol (ULTRAM) 50 MG tablet Take 50 mg by mouth 3 (three) times daily as needed for moderate pain.   09/24/2019 at 0800  . vitamin B-12 (CYANOCOBALAMIN) 1000 MCG tablet Take 1,000 mcg by mouth daily.   09/24/2019 at 0800  . vitamin E 180 MG (400 UNITS) capsule Take 400 Units by mouth daily.    09/24/2019 at 0800  . docusate sodium (COLACE) 100 MG capsule Take 100 mg by mouth daily.   unknown at prn  . donepezil (ARICEPT) 10 MG tablet Take 10 mg by mouth daily.    09/23/2019 at 2000  . glimepiride (AMARYL) 2 MG tablet Take 2 mg by mouth daily with breakfast. (Patient not taking: Reported on 09/25/2019)   Not Taking at Unknown time  . montelukast (SINGULAIR) 10 MG tablet Take 10 mg by mouth at bedtime.    09/23/2019 at 2000  . nitroGLYCERIN (NITROSTAT) 0.4 MG SL tablet Place 0.4 mg under the tongue every 5 (five) minutes as needed for chest pain.    unknown at prn   Scheduled:  . amiodarone  150 mg Intravenous Once  . cholecalciferol  1,000 Units Oral Daily  . cholestyramine  4 g Oral Daily  . docusate sodium  100 mg Oral Daily  . donepezil  10 mg Oral Daily  . enoxaparin (LOVENOX) injection  40 mg Subcutaneous Q24H  . [START ON 09/28/2019] furosemide  40 mg Intravenous Daily  . gabapentin  300 mg Oral q morning - 10a  . gabapentin  600 mg Oral QHS  . hydrOXYzine  25 mg Oral BID  . insulin aspart  0-15 Units Subcutaneous TID WC  . levothyroxine  200 mcg Oral Daily  . loratadine  10 mg Oral Daily  . metoprolol tartrate  12.5 mg Oral BID  . montelukast  10 mg Oral QHS  . multivitamin with minerals  1 tablet Oral Daily  . OLANZapine  2.5 mg Oral BID AC & HS  . omega-3 acid ethyl esters  1 g Oral Daily  . pantoprazole  40 mg Oral Daily  . potassium chloride SA  20 mEq Oral BID  . rosuvastatin  40 mg Oral Daily  . sertraline  100 mg Oral BID  . sodium chloride flush  3 mL Intravenous Q12H  . vitamin B-12  1,000 mcg Oral Daily  . vitamin E  400 Units Oral Daily   Infusions:  . sodium chloride 250 mL (09/26/19 0111)  . amiodarone     Followed by  . amiodarone     PRN: sodium chloride, acetaminophen **OR** acetaminophen, nitroGLYCERIN, ondansetron **OR** ondansetron (  ZOFRAN) IV, sodium chloride flush, traMADol Anti-infectives (From admission, onward)   None       Assessment: Pharmacy consulted to start heparin for afib. CHADSVASc > 6 points. Received enoxaparin 40 mg daily 7/9 @1119 .   Goal of Therapy:  Heparin level 0.3-0.7 units/ml Monitor platelets by anticoagulation protocol: Yes   Plan:  No bolus as patient received enoxaparin dose.   Start heparin infusion at 1100 units/hr Check anti-Xa level in 8 hours and daily while on heparin Continue to monitor H&H and platelets  Oswald Hillock, PharmD, BCPS 09/27/2019,2:15 PM

## 2019-09-28 LAB — BASIC METABOLIC PANEL
Anion gap: 10 (ref 5–15)
BUN: 26 mg/dL — ABNORMAL HIGH (ref 8–23)
CO2: 32 mmol/L (ref 22–32)
Calcium: 9 mg/dL (ref 8.9–10.3)
Chloride: 99 mmol/L (ref 98–111)
Creatinine, Ser: 0.93 mg/dL (ref 0.44–1.00)
GFR calc Af Amer: >60 mL/min (ref >60)
GFR calc non Af Amer: 58 mL/min — ABNORMAL LOW (ref >60)
Glucose, Bld: 159 mg/dL — ABNORMAL HIGH (ref 70–99)
Potassium: 4.2 mmol/L (ref 3.5–5.1)
Sodium: 141 mmol/L (ref 135–145)

## 2019-09-28 LAB — IRON AND TIBC
Iron: 47 ug/dL (ref 28–170)
Saturation Ratios: 15 % (ref 10.4–31.8)
TIBC: 307 ug/dL (ref 250–450)
UIBC: 260 ug/dL

## 2019-09-28 LAB — CBC
HCT: 35.1 % — ABNORMAL LOW (ref 36.0–46.0)
Hemoglobin: 11.2 g/dL — ABNORMAL LOW (ref 12.0–15.0)
MCH: 30.3 pg (ref 26.0–34.0)
MCHC: 31.9 g/dL (ref 30.0–36.0)
MCV: 94.9 fL (ref 80.0–100.0)
Platelets: 203 10*3/uL (ref 150–400)
RBC: 3.7 MIL/uL — ABNORMAL LOW (ref 3.87–5.11)
RDW: 15.6 % — ABNORMAL HIGH (ref 11.5–15.5)
WBC: 6.8 10*3/uL (ref 4.0–10.5)
nRBC: 0 % (ref 0.0–0.2)

## 2019-09-28 LAB — HEPARIN LEVEL (UNFRACTIONATED)
Heparin Unfractionated: 0.91 IU/mL — ABNORMAL HIGH (ref 0.30–0.70)
Heparin Unfractionated: 0.54 IU/mL (ref 0.30–0.70)

## 2019-09-28 LAB — GLUCOSE, CAPILLARY
Glucose-Capillary: 159 mg/dL — ABNORMAL HIGH (ref 70–99)
Glucose-Capillary: 235 mg/dL — ABNORMAL HIGH (ref 70–99)
Glucose-Capillary: 142 mg/dL — ABNORMAL HIGH (ref 70–99)
Glucose-Capillary: 218 mg/dL — ABNORMAL HIGH (ref 70–99)

## 2019-09-28 LAB — FERRITIN: Ferritin: 36 ng/mL (ref 11–307)

## 2019-09-28 LAB — BRAIN NATRIURETIC PEPTIDE: B Natriuretic Peptide: 486.7 pg/mL — ABNORMAL HIGH (ref 0.0–100.0)

## 2019-09-28 NOTE — Consult Note (Signed)
[facility] for Heparin Indication: atrial fibrillation  Allergies  Allergen Reactions  . Penicillins Anaphylaxis, Swelling and Rash   Patient Measurements: Height: 5\' 8"  (172.7 cm) Weight: 91.2 kg (201 lb) IBW/kg (Calculated) : 63.9 Heparin Dosing Weight: 82.8 kg  Vital Signs: Temp: 98.6 F (37 C) (07/10 1927) Temp Source: Oral (07/10 1927) BP: 117/87 (07/10 1927) Pulse Rate: 73 (07/10 1927)  Labs: Recent Labs    09/26/19 0717 09/26/19 0717 09/27/19 0534 09/27/19 2240 09/28/19 0607 09/28/19 1009 09/28/19 2018  HGB 9.9*   < > 10.1*  --   --  11.2*  --   HCT 30.4*  --  31.8*  --   --  35.1*  --   PLT 170  --  174  --   --  203  --   HEPARINUNFRC  --   --   --  0.71*  --  0.91* 0.54  CREATININE 0.96  --  0.96  --  0.93  --   --    < > = values in this interval not displayed.    Estimated Creatinine Clearance: 56 mL/min (by C-G formula based on SCr of 0.93 mg/dL).   Medical History: Past Medical History:  Diagnosis Date  . Anxiety   . Arthritis   . Broken arm    left FA 3/17  . Broken arm    left  . CAD (coronary artery disease)    s/p MI  . Cancer ([place])    uterine  . Cervical disc disorder   . Depression   . Diabetes mellitus without complication ([place])   . GERD (gastroesophageal reflux disease)   . Headache   . Hyperlipidemia   . Hypertension   . Hypothyroid   . Myocardial infarction ([place])    15 year ago  . Neck pain 06/04/2014  . Parkinson's disease ([place])   . Presence of permanent cardiac pacemaker   . Spinal stenosis     Medications:  Medications Prior to Admission  Medication Sig Dispense Refill Last Dose  . aspirin EC 81 MG tablet Take 81 mg by mouth daily.   09/24/2019 at 0800  . cholecalciferol (VITAMIN D3) 25 MCG (1000 UNIT) tablet Take 1,000 Units by mouth daily.   09/24/2019 at 0800  . cholestyramine (QUESTRAN) 4 g packet One packet daily in water (Patient taking differently: Take 4 g by mouth daily. in  water) 30 each 11 09/24/2019 at 0800  . gabapentin (NEURONTIN) 300 MG capsule Take 300-600 mg by mouth See admin instructions. Take 300 mg in the morning and 600 mg at night   09/24/2019 at 0800  . hydrOXYzine (ATARAX/VISTARIL) 25 MG tablet Take 25 mg by mouth 2 (two) times daily.   09/24/2019 at 0800  . levothyroxine (SYNTHROID) 200 MCG tablet Take 200 mcg by mouth daily.    09/24/2019 at 0700  . LINZESS 72 MCG capsule Take 72 mcg by mouth every morning.   09/24/2019 at 0800  . loratadine (CLARITIN) 10 MG tablet Take 10 mg by mouth daily.    09/24/2019 at 0800  . meclizine (ANTIVERT) 12.5 MG tablet Take 12.5 mg by mouth 2 (two) times daily.    09/24/2019 at 0800  . Multiple Vitamin (MULTIVITAMIN WITH MINERALS) TABS tablet Take 1 tablet by mouth daily.   09/24/2019 at 0800  . OLANZapine (ZYPREXA) 2.5 MG tablet Take 2.5 mg by mouth in the morning and at bedtime.    09/24/2019 at 0800  . Omega-3 Fatty Acids (  FISH OIL) 1000 MG CAPS Take 1,000 mg by mouth daily.   09/24/2019 at 0800  . omeprazole (PRILOSEC) 40 MG capsule Take 40 mg by mouth 2 (two) times daily.   09/24/2019 at 0800  . pioglitazone (ACTOS) 30 MG tablet Take 30 mg by mouth daily.    09/24/2019 at 0800  . potassium chloride SA (K-DUR,KLOR-CON) 20 MEQ tablet Take 20 mEq by mouth 2 (two) times daily.    09/24/2019 at 0800  . rosuvastatin (CRESTOR) 40 MG tablet Take 40 mg by mouth daily.   09/24/2019 at 0800  . sertraline (ZOLOFT) 100 MG tablet Take 100 mg by mouth 2 (two) times daily.   09/24/2019 at 0800  . torsemide (DEMADEX) 20 MG tablet Take 2 tablets (40 mg) by mouth once daily (Patient taking differently: Take 40 mg by mouth daily. ) 60 tablet 6 09/24/2019 at 0800  . traMADol (ULTRAM) 50 MG tablet Take 50 mg by mouth 3 (three) times daily as needed for moderate pain.   09/24/2019 at 0800  . vitamin B-12 (CYANOCOBALAMIN) 1000 MCG tablet Take 1,000 mcg by mouth daily.   09/24/2019 at 0800  . vitamin E 180 MG (400 UNITS) capsule Take 400 Units by mouth daily.   09/24/2019 at  0800  . docusate sodium (COLACE) 100 MG capsule Take 100 mg by mouth daily.   unknown at prn  . donepezil (ARICEPT) 10 MG tablet Take 10 mg by mouth daily.    09/23/2019 at 2000  . glimepiride (AMARYL) 2 MG tablet Take 2 mg by mouth daily with breakfast. (Patient not taking: Reported on 09/25/2019)   Not Taking at Unknown time  . montelukast (SINGULAIR) 10 MG tablet Take 10 mg by mouth at bedtime.    09/23/2019 at 2000  . nitroGLYCERIN (NITROSTAT) 0.4 MG SL tablet Place 0.4 mg under the tongue every 5 (five) minutes as needed for chest pain.    unknown at prn   Scheduled:  . cholecalciferol  1,000 Units Oral Daily  . cholestyramine  4 g Oral Daily  . docusate sodium  100 mg Oral Daily  . donepezil  10 mg Oral Daily  . furosemide  40 mg Intravenous Daily  . gabapentin  300 mg Oral q morning - 10a  . gabapentin  600 mg Oral QHS  . hydrOXYzine  25 mg Oral BID  . insulin aspart  0-15 Units Subcutaneous TID WC  . levothyroxine  200 mcg Oral Daily  . loratadine  10 mg Oral Daily  . metoprolol tartrate  12.5 mg Oral BID  . montelukast  10 mg Oral QHS  . multivitamin with minerals  1 tablet Oral Daily  . OLANZapine  2.5 mg Oral BID AC & HS  . omega-3 acid ethyl esters  1 g Oral Daily  . pantoprazole  40 mg Oral Daily  . potassium chloride SA  20 mEq Oral BID  . rosuvastatin  40 mg Oral Daily  . sertraline  100 mg Oral BID  . sodium chloride flush  3 mL Intravenous Q12H  . vitamin B-12  1,000 mcg Oral Daily  . vitamin E  400 Units Oral Daily   Infusions:  . sodium chloride 250 mL (09/26/19 0111)  . amiodarone 30 mg/hr (09/28/19 1519)  . heparin 850 Units/hr (09/28/19 1639)   PRN: sodium chloride, acetaminophen **OR** acetaminophen, nitroGLYCERIN, ondansetron **OR** ondansetron (ZOFRAN) IV, sodium chloride flush, traMADol Anti-infectives (From admission, onward)   None     Assessment:  79 y.o. female with  medical history significant for chronic diastolic dysfunction CHF, hypertension who  presents to the emergency room via EMS for evaluation of worsening lower extremity swelling for 2 weeks, increased abdominal girth and exertional shortness of breath. Pharmacy was  consulted to start heparin for afib, also on  amiodarone, metoprolol. CHADSVASc > 6 points, no noted recent h/o chronic AC.   Heparin Course: 07/09 initiation: 1100 units/hr 07/09 2240 HL 0.71: decreased to 1000 units/hr 07/10 1009 HL 0.91: decreased to 850 units/hr 07/10 2018 HL 0.54: no change  Goal of Therapy:  Heparin level 0.3-0.7 units/ml Monitor platelets by anticoagulation protocol: Yes   Plan:   Anti-Xa level therapeutic. Will continue rate of 850 units/hr  reheck anti-Xa level with AM labs  Continue to monitor H&H and platelets   Pearla Dubonnet, PharmD 09/28/2019,8:48 PM

## 2019-09-28 NOTE — Consult Note (Signed)
[place] for Heparin Indication: atrial fibrillation  Allergies  Allergen Reactions  . Penicillins Anaphylaxis, Swelling and Rash   Patient Measurements: Height: 5\' 8"  (172.7 cm) Weight: 91.2 kg (201 lb) IBW/kg (Calculated) : 63.9 Heparin Dosing Weight: 82.8 kg  Vital Signs: Temp: 97.6 F (36.4 C) (07/10 0420) Temp Source: Oral (07/10 0420) BP: 104/67 (07/10 0420) Pulse Rate: 81 (07/10 0420)  Labs: Recent Labs    09/26/19 0717 09/27/19 0534 09/27/19 2240 09/28/19 0607  HGB 9.9* 10.1*  --   --   HCT 30.4* 31.8*  --   --   PLT 170 174  --   --   HEPARINUNFRC  --   --  0.71*  --   CREATININE 0.96 0.96  --  0.93    Estimated Creatinine Clearance: 56 mL/min (by C-G formula based on SCr of 0.93 mg/dL).   Medical History: Past Medical History:  Diagnosis Date  . Anxiety   . Arthritis   . Broken arm    left FA 3/17  . Broken arm    left  . CAD (coronary artery disease)    s/p MI  . Cancer ([place])    uterine  . Cervical disc disorder   . Depression   . Diabetes mellitus without complication ([place])   . GERD (gastroesophageal reflux disease)   . Headache   . Hyperlipidemia   . Hypertension   . Hypothyroid   . Myocardial infarction ([place])    15 year ago  . Neck pain 06/04/2014  . Parkinson's disease ([place])   . Presence of permanent cardiac pacemaker   . Spinal stenosis     Medications:  Medications Prior to Admission  Medication Sig Dispense Refill Last Dose  . aspirin EC 81 MG tablet Take 81 mg by mouth daily.   09/24/2019 at 0800  . cholecalciferol (VITAMIN D3) 25 MCG (1000 UNIT) tablet Take 1,000 Units by mouth daily.   09/24/2019 at 0800  . cholestyramine (QUESTRAN) 4 g packet One packet daily in water (Patient taking differently: Take 4 g by mouth daily. in water) 30 each 11 09/24/2019 at 0800  . gabapentin (NEURONTIN) 300 MG capsule Take 300-600 mg by mouth See admin instructions. Take 300 mg in the morning and 600 mg at  night   09/24/2019 at 0800  . hydrOXYzine (ATARAX/VISTARIL) 25 MG tablet Take 25 mg by mouth 2 (two) times daily.   09/24/2019 at 0800  . levothyroxine (SYNTHROID) 200 MCG tablet Take 200 mcg by mouth daily.    09/24/2019 at 0700  . LINZESS 72 MCG capsule Take 72 mcg by mouth every morning.   09/24/2019 at 0800  . loratadine (CLARITIN) 10 MG tablet Take 10 mg by mouth daily.    09/24/2019 at 0800  . meclizine (ANTIVERT) 12.5 MG tablet Take 12.5 mg by mouth 2 (two) times daily.    09/24/2019 at 0800  . Multiple Vitamin (MULTIVITAMIN WITH MINERALS) TABS tablet Take 1 tablet by mouth daily.   09/24/2019 at 0800  . OLANZapine (ZYPREXA) 2.5 MG tablet Take 2.5 mg by mouth in the morning and at bedtime.    09/24/2019 at 0800  . Omega-3 Fatty Acids (FISH OIL) 1000 MG CAPS Take 1,000 mg by mouth daily.   09/24/2019 at 0800  . omeprazole (PRILOSEC) 40 MG capsule Take 40 mg by mouth 2 (two) times daily.   09/24/2019 at 0800  . pioglitazone (ACTOS) 30 MG tablet Take 30 mg by mouth daily.  09/24/2019 at 0800  . potassium chloride SA (K-DUR,KLOR-CON) 20 MEQ tablet Take 20 mEq by mouth 2 (two) times daily.    09/24/2019 at 0800  . rosuvastatin (CRESTOR) 40 MG tablet Take 40 mg by mouth daily.   09/24/2019 at 0800  . sertraline (ZOLOFT) 100 MG tablet Take 100 mg by mouth 2 (two) times daily.   09/24/2019 at 0800  . torsemide (DEMADEX) 20 MG tablet Take 2 tablets (40 mg) by mouth once daily (Patient taking differently: Take 40 mg by mouth daily. ) 60 tablet 6 09/24/2019 at 0800  . traMADol (ULTRAM) 50 MG tablet Take 50 mg by mouth 3 (three) times daily as needed for moderate pain.   09/24/2019 at 0800  . vitamin B-12 (CYANOCOBALAMIN) 1000 MCG tablet Take 1,000 mcg by mouth daily.   09/24/2019 at 0800  . vitamin E 180 MG (400 UNITS) capsule Take 400 Units by mouth daily.   09/24/2019 at 0800  . docusate sodium (COLACE) 100 MG capsule Take 100 mg by mouth daily.   unknown at prn  . donepezil (ARICEPT) 10 MG tablet Take 10 mg by mouth daily.     09/23/2019 at 2000  . glimepiride (AMARYL) 2 MG tablet Take 2 mg by mouth daily with breakfast. (Patient not taking: Reported on 09/25/2019)   Not Taking at Unknown time  . montelukast (SINGULAIR) 10 MG tablet Take 10 mg by mouth at bedtime.    09/23/2019 at 2000  . nitroGLYCERIN (NITROSTAT) 0.4 MG SL tablet Place 0.4 mg under the tongue every 5 (five) minutes as needed for chest pain.    unknown at prn   Scheduled:  . cholecalciferol  1,000 Units Oral Daily  . cholestyramine  4 g Oral Daily  . docusate sodium  100 mg Oral Daily  . donepezil  10 mg Oral Daily  . furosemide  40 mg Intravenous Daily  . gabapentin  300 mg Oral q morning - 10a  . gabapentin  600 mg Oral QHS  . hydrOXYzine  25 mg Oral BID  . insulin aspart  0-15 Units Subcutaneous TID WC  . levothyroxine  200 mcg Oral Daily  . loratadine  10 mg Oral Daily  . metoprolol tartrate  12.5 mg Oral BID  . montelukast  10 mg Oral QHS  . multivitamin with minerals  1 tablet Oral Daily  . OLANZapine  2.5 mg Oral BID AC & HS  . omega-3 acid ethyl esters  1 g Oral Daily  . pantoprazole  40 mg Oral Daily  . potassium chloride SA  20 mEq Oral BID  . rosuvastatin  40 mg Oral Daily  . sertraline  100 mg Oral BID  . sodium chloride flush  3 mL Intravenous Q12H  . vitamin B-12  1,000 mcg Oral Daily  . vitamin E  400 Units Oral Daily   Infusions:  . sodium chloride 250 mL (09/26/19 0111)  . amiodarone 30 mg/hr (09/27/19 2142)  . heparin 1,000 Units/hr (09/28/19 0103)   PRN: sodium chloride, acetaminophen **OR** acetaminophen, nitroGLYCERIN, ondansetron **OR** ondansetron (ZOFRAN) IV, sodium chloride flush, traMADol Anti-infectives (From admission, onward)   None     Assessment:  79 y.o. female with medical history significant for chronic diastolic dysfunction CHF, hypertension who presents to the emergency room via EMS for evaluation of worsening lower extremity swelling for 2 weeks, increased abdominal girth and exertional shortness of  breath. Pharmacy was  consulted to start heparin for afib, also on  amiodarone, metoprolol. CHADSVASc > 6  points, no noted recent h/o chronic AC.   Heparin Course: 07/09 initiation: 1100 units/hr 07/09 2240 HL 0.71: decreased to 1000 units/hr 07/10 1009 HL 0.91: decreased to 850 units/hr  Goal of Therapy:  Heparin level 0.3-0.7 units/ml Monitor platelets by anticoagulation protocol: Yes   Plan:   Anti-Xa level remains supra-therapeutic following previous rate decrease: reduce rate to 850 units/hr  rheck anti-Xa level in 8 hours after rate change and daily while on heparin  Continue to monitor H&H and platelets   Dallie Piles, PharmD 09/28/2019,8:34 AM

## 2019-09-28 NOTE — Progress Notes (Signed)
Progress Note  Patient Name: Kristi [name] Date of Encounter: 09/28/2019  Primary Cardiologist: Kate Sable, MD   Subjective   No complaints this am. NO dyspnea or chest pain.   Inpatient Medications    Scheduled Meds: . cholecalciferol  1,000 Units Oral Daily  . cholestyramine  4 g Oral Daily  . docusate sodium  100 mg Oral Daily  . donepezil  10 mg Oral Daily  . furosemide  40 mg Intravenous Daily  . gabapentin  300 mg Oral q morning - 10a  . gabapentin  600 mg Oral QHS  . hydrOXYzine  25 mg Oral BID  . insulin aspart  0-15 Units Subcutaneous TID WC  . levothyroxine  200 mcg Oral Daily  . loratadine  10 mg Oral Daily  . metoprolol tartrate  12.5 mg Oral BID  . montelukast  10 mg Oral QHS  . multivitamin with minerals  1 tablet Oral Daily  . OLANZapine  2.5 mg Oral BID AC & HS  . omega-3 acid ethyl esters  1 g Oral Daily  . pantoprazole  40 mg Oral Daily  . potassium chloride SA  20 mEq Oral BID  . rosuvastatin  40 mg Oral Daily  . sertraline  100 mg Oral BID  . sodium chloride flush  3 mL Intravenous Q12H  . vitamin B-12  1,000 mcg Oral Daily  . vitamin E  400 Units Oral Daily   Continuous Infusions: . sodium chloride 250 mL (09/26/19 0111)  . amiodarone 30 mg/hr (09/28/19 0917)  . heparin 1,000 Units/hr (09/28/19 0103)   PRN Meds: sodium chloride, acetaminophen **OR** acetaminophen, nitroGLYCERIN, ondansetron **OR** ondansetron (ZOFRAN) IV, sodium chloride flush, traMADol   Vital Signs    Vitals:   09/28/19 0341 09/28/19 0420 09/28/19 0904 09/28/19 0912  BP:  104/67 107/63   Pulse:  81 81 79  Resp:  18 18   Temp:  97.6 F (36.4 C) (!) 97.5 F (36.4 C)   TempSrc:  Oral Oral   SpO2:  100% 95% 94%  Weight: 91.2 kg     Height:        Intake/Output Summary (Last 24 hours) at 09/28/2019 0952 Last data filed at 09/28/2019 9323 Gross per 24 hour  Intake 600 ml  Output 1400 ml  Net -800 ml   Last 3 Weights 09/28/2019 09/27/2019 09/26/2019  Weight  (lbs) 201 lb 199 lb 11.2 oz 198 lb 10.2 oz  Weight (kg) 91.173 kg 90.583 kg 90.1 kg      Telemetry    Atrial fib, rates 80-120 bpm- Personally Reviewed  ECG    No AM EKG - Personally Reviewed  Physical Exam    General: Well developed, well nourished, NAD  HEENT: OP clear, mucus membranes moist  SKIN: warm, dry. No rashes. Neuro: No focal deficits  Musculoskeletal: Muscle strength 5/5 all ext  Psychiatric: Mood and affect normal  Neck: No JVD, no carotid bruits, no thyromegaly, no lymphadenopathy.  Lungs:Clear bilaterally, no wheezes, rhonci, crackles Cardiovascular: Irreg irreg. Systolic murmur.  Abdomen:Soft. Bowel sounds present. Non-tender.  Extremities: No lower extremity edema. Pulses are 2 + in the bilateral DP/PT.     Labs    High Sensitivity Troponin:   Recent Labs  Lab 09/24/19 2057 09/24/19 2347 09/25/19 0608 09/25/19 0821  TROPONINIHS 28* 32* 31* 33*      Chemistry Recent Labs  Lab 09/26/19 0717 09/27/19 0534 09/28/19 0607  NA 140 142 141  K 3.7 4.2 4.2  CL 96* 98  99  CO2 32 33* 32  GLUCOSE 140* 142* 159*  BUN 19 24* 26*  CREATININE 0.96 0.96 0.93  CALCIUM 9.0 9.1 9.0  ALBUMIN 3.4*  --   --   GFRNONAA 55* 55* 58*  GFRAA >60 >60 >60  ANIONGAP 12 11 10      Hematology Recent Labs  Lab 09/24/19 2057 09/26/19 0717 09/27/19 0534  WBC 4.5 3.9* 4.2  RBC 3.36* 3.25* 3.28*  HGB 10.3* 9.9* 10.1*  HCT 32.2* 30.4* 31.8*  MCV 95.8 93.5 97.0  MCH 30.7 30.5 30.8  MCHC 32.0 32.6 31.8  RDW 15.6* 15.8* 15.7*  PLT 177 170 174    BNP Recent Labs  Lab 09/24/19 2058 09/27/19 0534 09/28/19 0607  BNP 104.7* 101.6* 486.7*     DDimer No results for input(s): DDIMER in the last 168 hours.   Radiology    No results found.  Cardiac Studies   Echo 08/12/19 1. Left ventricular ejection fraction, by estimation, is 50 to 55%. The  left ventricle has low normal function. Left ventricular endocardial  border not optimally defined to evaluate  regional wall motion. There is  mild left ventricular hypertrophy. Left  ventricular diastolic parameters are indeterminate.  2. Right ventricular systolic function is normal. The right ventricular  size is normal. There is moderately elevated pulmonary artery systolic  pressure.  3. Left atrial size was severely dilated.  4. Right atrial size was mildly dilated.  5. The mitral valve is normal in structure. Moderate mitral valve  regurgitation. No evidence of mitral stenosis.  6. Tricuspid valve regurgitation is severe.  7. The aortic valve is normal in structure. Aortic valve regurgitation is  trivial. Mild aortic valve sclerosis is present, with no evidence of  aortic valve stenosis.  8. Moderately dilated pulmonary artery.  9. The inferior vena cava is normal in size with <50% respiratory  variability, suggesting right atrial pressure of 8 mmHg.  Comparison(s): Previous Echo showed LV EF 50-55%, no RWMA, moderately  increased RV wall thickness, trivial AI, moderate LAE.   [employer]onic PPM change-out 09/05/19 Preoperative diagnosis Battery approaching ERI CHF atrial and ventricular lead insertion reversal Procedure: Generator replacement ; pocket revision ; lead repair Interrogation of the previously implanted ventricular lead [employer] demonstrated an R wave of 14 millivolts., and impedance of 608 ohms, and a pacing threshold of 0.3 volts at 0.5 msec. This LEAD WAS REMOVED FROM THE ATRIAL PORT The previously implanted atrial lead [employer] Jude demonstrated a P-wave amplitude of 9 milllivolts and impedance of 493 ohms, and a pacing threshold of 0.4 volts at @ 0.80milliseconds. Repair was needed with heme discoloration and the lead was optim covered and medical adhesive was applied The leads were inspected. The leads were then attached to a Medtronic pulse generator, serial number [id number] Following insertion of the device back in the pocket changes in the atrial lead were further  noted 1) Bipolar impedance [id number]--- lead reprogramed unipolar pacing impedance 456  2) threshold bip 0.5@ 0.4 >>1.25 @ 0.5 bip ---- 0.75 @ 1.0. unipolar Pocket was revised to allow for medial translocation of the device  Patient Profile     79 y.o. female  with a hx of HFpEF, lymphedema, CAD s/p prior CABG x3 over 15 years prior and interval stenting with PCI x5 stents, Medtronic PPM 2008 with lead revision and generator change in 2014 and 09/05/2019, first-degree AV block, DM2, HTN, HLD, dementia (lives by herself), and who is being seen today for HFpEF.   Assessment &  Plan    1. Acute on chronic diastolic CHF: She has diuresed well, now negative 3.3 liters since admission. She does not appear to be volume overloaded.  -I would consider changing the IV Lasix to Torsemide 60 mg daily.   2. CAD s/p CABG: No chest pain. Continue beta blocker and statin.    3. Hypokalemia, resolved: Continue potassium supplementation.   4. Sinus node dysfunction, s/p pacemaker: She follows with EP  5. Paroxysmal atrial fibrillation: Rate is controlled currently on amiodarone drip but earlier this am rates in the 120s -I would increase her metoprolol to 25 mg po BID today -Continue amiodarone loading today.  -Transition to Eliquis today and stop heparin.    For questions or updates, please contact [facility] Please consult [email or web address] for contact info under        Signed, Lauree Chandler, MD  09/28/2019, 9:52 AM
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
## 2019-09-29 LAB — BASIC METABOLIC PANEL
Anion gap: 10 (ref 5–15)
BUN: 21 mg/dL (ref 8–23)
CO2: 30 mmol/L (ref 22–32)
Calcium: 9.2 mg/dL (ref 8.9–10.3)
Chloride: 101 mmol/L (ref 98–111)
Creatinine, Ser: 1.02 mg/dL — ABNORMAL HIGH (ref 0.44–1.00)
GFR calc Af Amer: 60 mL/min — ABNORMAL LOW (ref >60)
GFR calc non Af Amer: 52 mL/min — ABNORMAL LOW (ref >60)
Glucose, Bld: 175 mg/dL — ABNORMAL HIGH (ref 70–99)
Potassium: 4.2 mmol/L (ref 3.5–5.1)
Sodium: 141 mmol/L (ref 135–145)

## 2019-09-29 LAB — CBC
HCT: 34.1 % — ABNORMAL LOW (ref 36.0–46.0)
Hemoglobin: 11.1 g/dL — ABNORMAL LOW (ref 12.0–15.0)
MCH: 30.2 pg (ref 26.0–34.0)
MCHC: 32.6 g/dL (ref 30.0–36.0)
MCV: 92.9 fL (ref 80.0–100.0)
Platelets: 191 10*3/uL (ref 150–400)
RBC: 3.67 MIL/uL — ABNORMAL LOW (ref 3.87–5.11)
RDW: 15.8 % — ABNORMAL HIGH (ref 11.5–15.5)
WBC: 5.1 10*3/uL (ref 4.0–10.5)
nRBC: 0 % (ref 0.0–0.2)

## 2019-09-29 LAB — GLUCOSE, CAPILLARY
Glucose-Capillary: 161 mg/dL — ABNORMAL HIGH (ref 70–99)
Glucose-Capillary: 196 mg/dL — ABNORMAL HIGH (ref 70–99)
Glucose-Capillary: 199 mg/dL — ABNORMAL HIGH (ref 70–99)
Glucose-Capillary: 177 mg/dL — ABNORMAL HIGH (ref 70–99)

## 2019-09-29 LAB — BRAIN NATRIURETIC PEPTIDE: B Natriuretic Peptide: 359.9 pg/mL — ABNORMAL HIGH (ref 0.0–100.0)

## 2019-09-29 LAB — HEPARIN LEVEL (UNFRACTIONATED): Heparin Unfractionated: 0.62 IU/mL (ref 0.30–0.70)

## 2019-09-29 MED ORDER — APIXABAN 5 MG PO TABS
5.0000 mg | ORAL_TABLET | Freq: Two times a day (BID) | ORAL | Status: DC
  Administered 2019-09-29 – 2019-09-30 (×3): 5 mg via ORAL
  Filled 2019-09-29 (×3): qty 1

## 2019-09-29 MED ORDER — AMIODARONE HCL 200 MG PO TABS
200.0000 mg | ORAL_TABLET | Freq: Two times a day (BID) | ORAL | Status: DC
  Administered 2019-09-29 – 2019-09-30 (×2): 200 mg via ORAL
  Filled 2019-09-29 (×2): qty 1

## 2019-09-29 MED ORDER — TORSEMIDE 20 MG PO TABS
60.0000 mg | ORAL_TABLET | Freq: Every day | ORAL | Status: DC
  Administered 2019-09-30: 60 mg via ORAL
  Filled 2019-09-29: qty 3

## 2019-09-29 NOTE — Evaluation (Signed)
Occupational Therapy Evaluation Patient Details Name: Kristi [name] MRN: [id number] DOB: [birth year] Today's Date: 09/29/2019    History of Present Illness Kristi [name] is a 79 y.o. female with medical history significant for chronic diastolic dysfunction CHF, hypertension who presents to the emergency room via EMS for evaluation of worsening lower extremity swelling for 2 weeks, increased abdominal girth and exertional shortness of breath.  Patient states that she has had symptoms for about 2 weeks and recently had a pacemaker generator change out.  She had contacted her primary care provider 1 day prior to admission and the dose of her diuretic was increased.  Per patient she has gained about 40 pounds in the last 7 months.  She denies any dietary indiscretion and states that she is compliant with her medications.   Clinical Impression   Kristi [name] was seen for OT evaluation this date. Prior to hospital admission, pt was MOD I for mobility and ADLs including sponge bathing standing sink side and dressing seated on commode. Pt lives alone c family available PRN. Pt presents to acute OT demonstrating impaired ADL performance and functional mobility 2/2 decreased safety awareness, functional ROM/strength/balance deficits, and decreased LB access. Pt currently unable to doff socks at EOB - reports baseline. SBA don simulated pants at EOB - excessive forward trunk flexion to achieve LB access, pt not convinced this is unsafe technique. Anticipate MOD I for seated grooming tasks. Pt would benefit from skilled OT to address noted impairments and functional limitations (see below for any additional details) in order to maximize safety and independence while minimizing falls risk and caregiver burden. Upon hospital discharge, recommend HHOT to maximize pt safety and return to functional independence during meaningful occupations of daily life.     Follow Up Recommendations  Home health  OT;Supervision/Assistance - Intermittent    Equipment Recommendations  None recommended by OT    Recommendations for Other Services       Precautions / Restrictions Precautions Precautions: Fall Restrictions Weight Bearing Restrictions: No      Mobility Bed Mobility Overal bed mobility: Modified Independent             General bed mobility comments: increased time, bed rails, and HoB elevated  Transfers Overall transfer level: Needs assistance Equipment used: 1 person hand held assist Transfers: Sit to/from Stand Sit to Stand: From elevated surface;Min assist              Balance Overall balance assessment: Needs assistance Sitting-balance support: Feet supported Sitting balance-Leahy Scale: Good     Standing balance support: Bilateral upper extremity supported;During functional activity Standing balance-Leahy Scale: Fair                             ADL either performed or assessed with clinical judgement   ADL Overall ADL's : Needs assistance/impaired                                       General ADL Comments: Unable to doff socks at EOB - reports baseline. SBA don simulated pants at EOB - excessive forward trunk flexion to achieve LB access pt not convinced this is unsafe technique. Anticipate MOD I for seated grooming tasks     Vision         Perception     Praxis      Pertinent Vitals/Pain Pain Assessment:  No/denies pain     Hand Dominance Right   Extremity/Trunk Assessment Upper Extremity Assessment Upper Extremity Assessment: Generalized weakness   Lower Extremity Assessment Lower Extremity Assessment: Generalized weakness   Cervical / Trunk Assessment Cervical / Trunk Assessment: Normal   Communication Communication Communication: HOH   Cognition Arousal/Alertness: Awake/alert Behavior During Therapy: WFL for tasks assessed/performed Overall Cognitive Status: Within Functional Limits for tasks  assessed                                     General Comments  SpO2 98% t/o on 1L [place]     Exercises Exercises: Other exercises Other Exercises Other Exercises: Pt educated re: OT role, DME recs, d/c recs, falls prevention, ECS, home/routines modifications Other Exercises: LBD, sup<>sit, sit<>standsitting/standing balance/tolerance   Shoulder Instructions      Home Living Family/patient expects to be discharged to:: Private residence Living Arrangements: Alone Available Help at Discharge: Family;Available PRN/intermittently Type of Home: Mobile home Home Access: Ramped entrance     Home Layout: One level     Bathroom Shower/Tub: Teacher, early years/pre: Handicapped height Bathroom Accessibility: Yes   Home Equipment: Walker - 4 wheels;Toilet riser          Prior Functioning/Environment Level of Independence: Independent with assistive device(s)        Comments: Pt reports istting for LBD on commode - unable to don socks at baseline.         OT Problem List: Decreased strength;Decreased range of motion;Decreased activity tolerance;Impaired balance (sitting and/or standing);Decreased knowledge of use of DME or AE      OT Treatment/Interventions: Self-care/ADL training;Therapeutic exercise;DME and/or AE instruction;Energy conservation;Therapeutic activities;Balance training;Patient/family education    OT Goals(Current goals can be found in the care plan section) Acute Rehab OT Goals Patient Stated Goal: to return home, get fluid off OT Goal Formulation: With patient Time For Goal Achievement: 10/13/19 Potential to Achieve Goals: Good ADL Goals Pt Will Perform Lower Body Dressing: with set-up;with supervision;sit to/from stand (c LRAD PRN) Pt Will Transfer to Toilet: with modified independence;ambulating;regular height toilet (c LRAD PRN) Additional ADL Goal #1: Pt will independently verbalize plan to implement x3 falls prevention  strategies.  OT Frequency: Min 1X/week   Barriers to D/C: Decreased caregiver support          Co-evaluation              AM-PAC OT "6 Clicks" Daily Activity     Outcome Measure Help from another person eating meals?: None Help from another person taking care of personal grooming?: None Help from another person toileting, which includes using toliet, bedpan, or urinal?: A Little Help from another person bathing (including washing, rinsing, drying)?: A Little Help from another person to put on and taking off regular upper body clothing?: None Help from another person to put on and taking off regular lower body clothing?: A Little 6 Click Score: 21   End of Session Equipment Utilized During Treatment: Oxygen (1L [place]) Nurse Communication: Mobility status  Activity Tolerance: Patient tolerated treatment well Patient left: in bed;with call bell/phone within reach  OT Visit Diagnosis: Other abnormalities of gait and mobility (R26.89)                Time: 1950-9326 OT Time Calculation (min): 16 min Charges:  OT General Charges $OT Visit: 1 Visit OT Evaluation $OT Eval Low Complexity: 1 Low OT Treatments $  Self Care/Home Management : 8-22 mins  Dessie Coma, M.S. OTR/L  09/29/19, 1:15 PM

## 2019-09-29 NOTE — Consult Note (Signed)
ANTICOAGULATION CONSULT NOTE  Pharmacy Consult for Heparin Indication: atrial fibrillation  Patient Measurements: Height: 5\' 8"  (172.7 cm) Weight: 90.1 kg (198 lb 9.6 oz) IBW/kg (Calculated) : 63.9 Heparin Dosing Weight: 82.8 kg  Vital Signs: Temp: 98 F (36.7 C) (07/11 0741) Temp Source: Oral (07/11 0741) BP: 109/64 (07/11 0741) Pulse Rate: 74 (07/11 0741)  Labs: Recent Labs    09/27/19 0534 09/27/19 2240 09/28/19 0607 09/28/19 1009 09/28/19 2018  HGB 10.1*  --   --  11.2*  --   HCT 31.8*  --   --  35.1*  --   PLT 174  --   --  203  --   HEPARINUNFRC  --  0.71*  --  0.91* 0.54  CREATININE 0.96  --  0.93  --   --     Estimated Creatinine Clearance: 55.7 mL/min (by C-G formula based on SCr of 0.93 mg/dL).   Medical History: Past Medical History:  Diagnosis Date  . Anxiety   . Arthritis   . Broken arm    left FA 3/17  . Broken arm    left  . CAD (coronary artery disease)    s/p MI  . Cancer ([place])    uterine  . Cervical disc disorder   . Depression   . Diabetes mellitus without complication ([place])   . GERD (gastroesophageal reflux disease)   . Headache   . Hyperlipidemia   . Hypertension   . Hypothyroid   . Myocardial infarction ([place])    15 year ago  . Neck pain 06/04/2014  . Parkinson's disease ([place])   . Presence of permanent cardiac pacemaker   . Spinal stenosis     Medications:  Medications Prior to Admission  Medication Sig Dispense Refill Last Dose  . aspirin EC 81 MG tablet Take 81 mg by mouth daily.   09/24/2019 at 0800  . cholecalciferol (VITAMIN D3) 25 MCG (1000 UNIT) tablet Take 1,000 Units by mouth daily.   09/24/2019 at 0800  . cholestyramine (QUESTRAN) 4 g packet One packet daily in water (Patient taking differently: Take 4 g by mouth daily. in water) 30 each 11 09/24/2019 at 0800  . gabapentin (NEURONTIN) 300 MG capsule Take 300-600 mg by mouth See admin instructions. Take 300 mg in the morning and 600 mg at night   09/24/2019 at 0800  .  hydrOXYzine (ATARAX/VISTARIL) 25 MG tablet Take 25 mg by mouth 2 (two) times daily.   09/24/2019 at 0800  . levothyroxine (SYNTHROID) 200 MCG tablet Take 200 mcg by mouth daily.    09/24/2019 at 0700  . LINZESS 72 MCG capsule Take 72 mcg by mouth every morning.   09/24/2019 at 0800  . loratadine (CLARITIN) 10 MG tablet Take 10 mg by mouth daily.    09/24/2019 at 0800  . meclizine (ANTIVERT) 12.5 MG tablet Take 12.5 mg by mouth 2 (two) times daily.    09/24/2019 at 0800  . Multiple Vitamin (MULTIVITAMIN WITH MINERALS) TABS tablet Take 1 tablet by mouth daily.   09/24/2019 at 0800  . OLANZapine (ZYPREXA) 2.5 MG tablet Take 2.5 mg by mouth in the morning and at bedtime.    09/24/2019 at 0800  . Omega-3 Fatty Acids (FISH OIL) 1000 MG CAPS Take 1,000 mg by mouth daily.   09/24/2019 at 0800  . omeprazole (PRILOSEC) 40 MG capsule Take 40 mg by mouth 2 (two) times daily.   09/24/2019 at 0800  . pioglitazone (ACTOS) 30 MG tablet Take 30 mg by mouth  daily.    09/24/2019 at 0800  . potassium chloride SA (K-DUR,KLOR-CON) 20 MEQ tablet Take 20 mEq by mouth 2 (two) times daily.    09/24/2019 at 0800  . rosuvastatin (CRESTOR) 40 MG tablet Take 40 mg by mouth daily.   09/24/2019 at 0800  . sertraline (ZOLOFT) 100 MG tablet Take 100 mg by mouth 2 (two) times daily.   09/24/2019 at 0800  . torsemide (DEMADEX) 20 MG tablet Take 2 tablets (40 mg) by mouth once daily (Patient taking differently: Take 40 mg by mouth daily. ) 60 tablet 6 09/24/2019 at 0800  . traMADol (ULTRAM) 50 MG tablet Take 50 mg by mouth 3 (three) times daily as needed for moderate pain.   09/24/2019 at 0800  . vitamin B-12 (CYANOCOBALAMIN) 1000 MCG tablet Take 1,000 mcg by mouth daily.   09/24/2019 at 0800  . vitamin E 180 MG (400 UNITS) capsule Take 400 Units by mouth daily.   09/24/2019 at 0800  . docusate sodium (COLACE) 100 MG capsule Take 100 mg by mouth daily.   unknown at prn  . donepezil (ARICEPT) 10 MG tablet Take 10 mg by mouth daily.    09/23/2019 at 2000  . glimepiride  (AMARYL) 2 MG tablet Take 2 mg by mouth daily with breakfast. (Patient not taking: Reported on 09/25/2019)   Not Taking at Unknown time  . montelukast (SINGULAIR) 10 MG tablet Take 10 mg by mouth at bedtime.    09/23/2019 at 2000  . nitroGLYCERIN (NITROSTAT) 0.4 MG SL tablet Place 0.4 mg under the tongue every 5 (five) minutes as needed for chest pain.    unknown at prn   Scheduled:  . cholecalciferol  1,000 Units Oral Daily  . cholestyramine  4 g Oral Daily  . docusate sodium  100 mg Oral Daily  . donepezil  10 mg Oral Daily  . furosemide  40 mg Intravenous Daily  . gabapentin  300 mg Oral q morning - 10a  . gabapentin  600 mg Oral QHS  . hydrOXYzine  25 mg Oral BID  . insulin aspart  0-15 Units Subcutaneous TID WC  . levothyroxine  200 mcg Oral Daily  . loratadine  10 mg Oral Daily  . metoprolol tartrate  12.5 mg Oral BID  . montelukast  10 mg Oral QHS  . multivitamin with minerals  1 tablet Oral Daily  . OLANZapine  2.5 mg Oral BID AC & HS  . omega-3 acid ethyl esters  1 g Oral Daily  . pantoprazole  40 mg Oral Daily  . potassium chloride SA  20 mEq Oral BID  . rosuvastatin  40 mg Oral Daily  . sertraline  100 mg Oral BID  . sodium chloride flush  3 mL Intravenous Q12H  . vitamin B-12  1,000 mcg Oral Daily  . vitamin E  400 Units Oral Daily   Infusions:  . sodium chloride 250 mL (09/26/19 0111)  . amiodarone 30 mg/hr (09/28/19 2124)  . heparin 850 Units/hr (09/28/19 1639)   PRN: sodium chloride, acetaminophen **OR** acetaminophen, nitroGLYCERIN, ondansetron **OR** ondansetron (ZOFRAN) IV, sodium chloride flush, traMADol Anti-infectives (From admission, onward)   None     Assessment:  79 y.o. female with medical history significant for chronic diastolic dysfunction CHF, hypertension who presents to the emergency room via EMS for evaluation of worsening lower extremity swelling for 2 weeks, increased abdominal girth and exertional shortness of breath. Pharmacy was  consulted to  start heparin for afib, also on  amiodarone,  metoprolol. CHADSVASc > 6 points, no noted recent h/o chronic AC. H&H, platelets stable  Heparin Course: 07/09 initiation: 1100 units/hr 07/09 2240 HL 0.71: decreased to 1000 units/hr 07/10 1009 HL 0.91: decreased to 850 units/hr 07/10 2018 HL 0.54: no change 07/11 0806 HL 0.62: no change  Goal of Therapy:  Heparin level 0.3-0.7 units/ml Monitor platelets by anticoagulation protocol: Yes   Plan:   Anti-Xa level therapeutic: continue rate at 850 units/hr  This is the second consecutive therapeutic heparin level  Re-check anti-Xa level daily while on heparin  Continue to monitor H&H and platelets   Dallie Piles, PharmD 09/29/2019,9:14 AM
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
## 2019-09-29 NOTE — Progress Notes (Signed)
Patients BP 93/59. MD paged. Amiodarone drip stopped per cardiology.

## 2019-09-29 NOTE — Progress Notes (Signed)
Progress Note  Patient Name: Kristi [name] Date of Encounter: 09/29/2019  Primary Cardiologist: Kate Sable, MD   Subjective   No chest pain. She did report dyspnea this am.   Inpatient Medications    Scheduled Meds: . cholecalciferol  1,000 Units Oral Daily  . cholestyramine  4 g Oral Daily  . docusate sodium  100 mg Oral Daily  . donepezil  10 mg Oral Daily  . furosemide  40 mg Intravenous Daily  . gabapentin  300 mg Oral q morning - 10a  . gabapentin  600 mg Oral QHS  . hydrOXYzine  25 mg Oral BID  . insulin aspart  0-15 Units Subcutaneous TID WC  . levothyroxine  200 mcg Oral Daily  . loratadine  10 mg Oral Daily  . metoprolol tartrate  12.5 mg Oral BID  . montelukast  10 mg Oral QHS  . multivitamin with minerals  1 tablet Oral Daily  . OLANZapine  2.5 mg Oral BID AC & HS  . omega-3 acid ethyl esters  1 g Oral Daily  . pantoprazole  40 mg Oral Daily  . potassium chloride SA  20 mEq Oral BID  . rosuvastatin  40 mg Oral Daily  . sertraline  100 mg Oral BID  . sodium chloride flush  3 mL Intravenous Q12H  . vitamin B-12  1,000 mcg Oral Daily  . vitamin E  400 Units Oral Daily   Continuous Infusions: . sodium chloride 250 mL (09/26/19 0111)  . amiodarone 30 mg/hr (09/28/19 2124)  . heparin 850 Units/hr (09/28/19 1639)   PRN Meds: sodium chloride, acetaminophen **OR** acetaminophen, nitroGLYCERIN, ondansetron **OR** ondansetron (ZOFRAN) IV, sodium chloride flush, traMADol   Vital Signs    Vitals:   09/29/19 0337 09/29/19 0631 09/29/19 0723 09/29/19 0741  BP:  122/65  109/64  Pulse:  70  74  Resp:  12  18  Temp:  97.6 F (36.4 C)  98 F (36.7 C)  TempSrc:  Oral  Oral  SpO2:  91%  97%  Weight: 89.8 kg  90.1 kg   Height:        Intake/Output Summary (Last 24 hours) at 09/29/2019 0921 Last data filed at 09/29/2019 0444 Gross per 24 hour  Intake 1474.49 ml  Output 1450 ml  Net 24.49 ml   Last 3 Weights 09/29/2019 09/29/2019 09/28/2019  Weight (lbs)  198 lb 9.6 oz 198 lb 201 lb  Weight (kg) 90.084 kg 89.812 kg 91.173 kg      Telemetry    Atrial fib, rates 60s - Personally Reviewed  ECG    No AM EKG - Personally Reviewed  Physical Exam   General: Well developed, well nourished, NAD  HEENT: OP clear, mucus membranes moist  SKIN: warm, dry. No rashes. Neuro: No focal deficits  Musculoskeletal: Muscle strength 5/5 all ext  Psychiatric: Mood and affect normal  Neck: No JVD, no carotid bruits, no thyromegaly, no lymphadenopathy.  Lungs:Clear bilaterally, no wheezes, rhonci, crackles Cardiovascular: Irreg irreg. Systolic murmur.  Abdomen:Soft. Bowel sounds present. Non-tender.  Extremities: No lower extremity edema. Pulses are 2 + in the bilateral DP/PT.  Labs    High Sensitivity Troponin:   Recent Labs  Lab 09/24/19 2057 09/24/19 2347 09/25/19 0608 09/25/19 0821  TROPONINIHS 28* 32* 31* 33*      Chemistry Recent Labs  Lab 09/26/19 0717 09/27/19 0534 09/28/19 0607  NA 140 142 141  K 3.7 4.2 4.2  CL 96* 98 99  CO2 32 33* 32  GLUCOSE 140* 142* 159*  BUN 19 24* 26*  CREATININE 0.96 0.96 0.93  CALCIUM 9.0 9.1 9.0  ALBUMIN 3.4*  --   --   GFRNONAA 55* 55* 58*  GFRAA >60 >60 >60  ANIONGAP 12 11 10      Hematology Recent Labs  Lab 09/26/19 0717 09/27/19 0534 09/28/19 1009  WBC 3.9* 4.2 6.8  RBC 3.25* 3.28* 3.70*  HGB 9.9* 10.1* 11.2*  HCT 30.4* 31.8* 35.1*  MCV 93.5 97.0 94.9  MCH 30.5 30.8 30.3  MCHC 32.6 31.8 31.9  RDW 15.8* 15.7* 15.6*  PLT 170 174 203    BNP Recent Labs  Lab 09/24/19 2058 09/27/19 0534 09/28/19 0607  BNP 104.7* 101.6* 486.7*     DDimer No results for input(s): DDIMER in the last 168 hours.   Radiology    No results found.  Cardiac Studies   Echo 08/12/19 1. Left ventricular ejection fraction, by estimation, is 50 to 55%. The  left ventricle has low normal function. Left ventricular endocardial  border not optimally defined to evaluate regional wall motion. There  is  mild left ventricular hypertrophy. Left  ventricular diastolic parameters are indeterminate.  2. Right ventricular systolic function is normal. The right ventricular  size is normal. There is moderately elevated pulmonary artery systolic  pressure.  3. Left atrial size was severely dilated.  4. Right atrial size was mildly dilated.  5. The mitral valve is normal in structure. Moderate mitral valve  regurgitation. No evidence of mitral stenosis.  6. Tricuspid valve regurgitation is severe.  7. The aortic valve is normal in structure. Aortic valve regurgitation is  trivial. Mild aortic valve sclerosis is present, with no evidence of  aortic valve stenosis.  8. Moderately dilated pulmonary artery.  9. The inferior vena cava is normal in size with <50% respiratory  variability, suggesting right atrial pressure of 8 mmHg.  Comparison(s): Previous Echo showed LV EF 50-55%, no RWMA, moderately  increased RV wall thickness, trivial AI, moderate LAE.   Medtronic PPM change-out 09/05/19 Preoperative diagnosis Battery approaching ERI CHF atrial and ventricular lead insertion reversal Procedure: Generator replacement ; pocket revision ; lead repair Interrogation of the previously implanted ventricular lead St Jude demonstrated an R wave of 14 millivolts., and impedance of 608 ohms, and a pacing threshold of 0.3 volts at 0.5 msec. This LEAD WAS REMOVED FROM THE ATRIAL PORT The previously implanted atrial lead St Jude demonstrated a P-wave amplitude of 9 milllivolts and impedance of 493 ohms, and a pacing threshold of 0.4 volts at @ 0.19milliseconds. Repair was needed with heme discoloration and the lead was optim covered and medical adhesive was applied The leads were inspected. The leads were then attached to a Medtronic pulse generator, serial number [id number] Following insertion of the device back in the pocket changes in the atrial lead were further noted 1) Bipolar impedance  [id number]--- lead reprogramed unipolar pacing impedance 456  2) threshold bip 0.5@ 0.4 >>1.25 @ 0.5 bip ---- 0.75 @ 1.0. unipolar Pocket was revised to allow for medial translocation of the device  Patient Profile     79 y.o. female  with a hx of HFpEF, lymphedema, CAD s/p prior CABG x3 over 15 years prior and interval stenting with PCI x5 stents, Medtronic PPM 2008 with lead revision and generator change in 2014 and 09/05/2019, first-degree AV block, DM2, HTN, HLD, dementia (lives by herself), and who is being seen today for HFpEF.   Assessment & Plan    1. Acute  on chronic diastolic CHF: She has diuresed well, now negative 3.7 liters since admission. No volume overload on exam. -I would stop her IV lasix today and start Torsemide 60 mg daily.   2. CAD s/p CABG: She has no chest pain. Continue beta blocker and statin.    3. Hypokalemia, resolved: Continue potassium supplementation.   4. Sinus node dysfunction, s/p pacemaker: She follows with EP  5. Paroxysmal atrial fibrillation: Rate is controlled currently on amiodarone drip. -I would increase her metoprolol to 25 mg po BID today -Change to oral amiodarone 200 mg po BID.   -Transition to Eliquis today and stop heparin. (Eliquis 5 mg po BID)   For questions or updates, please contact [place] Please consult [email or web address] for contact info under        Signed, Lauree Chandler, MD  09/29/2019, 9:21 AM

## 2019-09-30 ENCOUNTER — Encounter: Payer: Self-pay | Admitting: Family Medicine

## 2019-09-30 DIAGNOSIS — I10 Essential (primary) hypertension: Secondary | ICD-10-CM

## 2019-09-30 LAB — CBC
HCT: 35.4 % — ABNORMAL LOW (ref 36.0–46.0)
Hemoglobin: 11.2 g/dL — ABNORMAL LOW (ref 12.0–15.0)
MCH: 30.2 pg (ref 26.0–34.0)
MCHC: 31.6 g/dL (ref 30.0–36.0)
MCV: 95.4 fL (ref 80.0–100.0)
Platelets: 187 10*3/uL (ref 150–400)
RBC: 3.71 MIL/uL — ABNORMAL LOW (ref 3.87–5.11)
RDW: 15.5 % (ref 11.5–15.5)
WBC: 5.8 10*3/uL (ref 4.0–10.5)
nRBC: 0 % (ref 0.0–0.2)

## 2019-09-30 LAB — GLUCOSE, CAPILLARY
Glucose-Capillary: 145 mg/dL — ABNORMAL HIGH (ref 70–99)
Glucose-Capillary: 144 mg/dL — ABNORMAL HIGH (ref 70–99)
Glucose-Capillary: 200 mg/dL — ABNORMAL HIGH (ref 70–99)

## 2019-09-30 LAB — BASIC METABOLIC PANEL
Anion gap: 6 (ref 5–15)
BUN: 29 mg/dL — ABNORMAL HIGH (ref 8–23)
CO2: 29 mmol/L (ref 22–32)
Calcium: 9.3 mg/dL (ref 8.9–10.3)
Chloride: 104 mmol/L (ref 98–111)
Creatinine, Ser: 1 mg/dL (ref 0.44–1.00)
GFR calc Af Amer: >60 mL/min (ref >60)
GFR calc non Af Amer: 53 mL/min — ABNORMAL LOW (ref >60)
Glucose, Bld: 153 mg/dL — ABNORMAL HIGH (ref 70–99)
Potassium: 4.5 mmol/L (ref 3.5–5.1)
Sodium: 139 mmol/L (ref 135–145)

## 2019-09-30 MED ORDER — TORSEMIDE 20 MG PO TABS: 60.0000 mg | ORAL_TABLET | Freq: Every day | ORAL | 0 refills | Status: DC

## 2019-09-30 MED ORDER — METOPROLOL TARTRATE 25 MG PO TABS: 12.5000 mg | ORAL_TABLET | Freq: Two times a day (BID) | ORAL | 0 refills | Status: DC

## 2019-09-30 MED ORDER — MECLIZINE HCL 12.5 MG PO TABS: 12.5000 mg | ORAL_TABLET | Freq: Two times a day (BID) | ORAL | 0 refills | Status: DC | PRN

## 2019-09-30 MED ORDER — APIXABAN 5 MG PO TABS: 5.0000 mg | ORAL_TABLET | Freq: Two times a day (BID) | ORAL | 0 refills | Status: DC

## 2019-09-30 MED ORDER — AMIODARONE HCL 200 MG PO TABS: 200.0000 mg | ORAL_TABLET | Freq: Two times a day (BID) | ORAL | 0 refills | Status: DC

## 2019-09-30 NOTE — Progress Notes (Signed)
Progress Note  Patient Name: Kristi [name] Date of Encounter: 09/30/2019  Primary Cardiologist: Agbor-Etang  Subjective   No chest pain or dyspnea. Back on room air. Documented UOP 1.9 L for the past 24 hours with a net - 5.5 L for the admission. No weight this morning.   Inpatient Medications    Scheduled Meds: . amiodarone  200 mg Oral BID  . apixaban  5 mg Oral BID  . cholecalciferol  1,000 Units Oral Daily  . cholestyramine  4 g Oral Daily  . docusate sodium  100 mg Oral Daily  . donepezil  10 mg Oral Daily  . gabapentin  300 mg Oral q morning - 10a  . gabapentin  600 mg Oral QHS  . hydrOXYzine  25 mg Oral BID  . insulin aspart  0-15 Units Subcutaneous TID WC  . levothyroxine  200 mcg Oral Daily  . loratadine  10 mg Oral Daily  . metoprolol tartrate  12.5 mg Oral BID  . montelukast  10 mg Oral QHS  . multivitamin with minerals  1 tablet Oral Daily  . OLANZapine  2.5 mg Oral BID AC & HS  . omega-3 acid ethyl esters  1 g Oral Daily  . pantoprazole  40 mg Oral Daily  . potassium chloride SA  20 mEq Oral BID  . rosuvastatin  40 mg Oral Daily  . sertraline  100 mg Oral BID  . sodium chloride flush  3 mL Intravenous Q12H  . torsemide  60 mg Oral Daily  . vitamin B-12  1,000 mcg Oral Daily  . vitamin E  400 Units Oral Daily   Continuous Infusions: . sodium chloride 250 mL (09/26/19 0111)   PRN Meds: sodium chloride, acetaminophen **OR** acetaminophen, nitroGLYCERIN, ondansetron **OR** ondansetron (ZOFRAN) IV, sodium chloride flush, traMADol   Vital Signs    Vitals:   09/30/19 0023 09/30/19 0430 09/30/19 0848 09/30/19 0850  BP: (!) 102/54 (!) 111/59 90/64 108/68  Pulse: 71 67 72 72  Resp: 18 18 18    Temp: 98.4 F (36.9 C) 98.3 F (36.8 C) 98 F (36.7 C)   TempSrc: Oral Oral    SpO2: 99% 100% 96% 96%  Weight:      Height:        Intake/Output Summary (Last 24 hours) at 09/30/2019 1156 Last data filed at 09/29/2019 1653 Gross per 24 hour  Intake --   Output 900 ml  Net -900 ml   Filed Weights   09/28/19 0341 09/29/19 0337 09/29/19 0723  Weight: 91.2 kg 89.8 kg 90.1 kg    Telemetry    Afib 70s bpm currently - Personally Reviewed  ECG    Pending - Personally Reviewed  Physical Exam   GEN: No acute distress.   Neck: No JVD. Cardiac: Irregularly irregular, II/VI systolic murmur, no rubs, or gallops.  Respiratory: Clear to auscultation bilaterally.  GI: Soft, nontender, non-distended.   MS: No edema; No deformity. Neuro:  Alert and oriented x 3; Nonfocal.  Psych: Normal affect.  Labs    Chemistry Recent Labs  Lab 09/26/19 [phone] 09/27/19 0534 09/28/19 0607 09/29/19 0806 09/30/19 0530  NA 140   < > 141 141 139  K 3.7   < > 4.2 4.2 4.5  CL 96*   < > 99 101 104  CO2 32   < > 32 30 29  GLUCOSE 140*   < > 159* 175* 153*  BUN 19   < > 26* 21 29*  CREATININE  0.96   < > 0.93 1.02* 1.00  CALCIUM 9.0   < > 9.0 9.2 9.3  ALBUMIN 3.4*  --   --   --   --   GFRNONAA 55*   < > 58* 52* 53*  GFRAA >60   < > >60 60* >60  ANIONGAP 12   < > 10 10 6    < > = values in this interval not displayed.     Hematology Recent Labs  Lab 09/28/19 1009 09/29/19 0806 09/30/19 0530  WBC 6.8 5.1 5.8  RBC 3.70* 3.67* 3.71*  HGB 11.2* 11.1* 11.2*  HCT 35.1* 34.1* 35.4*  MCV 94.9 92.9 95.4  MCH 30.3 30.2 30.2  MCHC 31.9 32.6 31.6  RDW 15.6* 15.8* 15.5  PLT 203 191 187    Cardiac EnzymesNo results for input(s): TROPONINI in the last 168 hours. No results for input(s): TROPIPOC in the last 168 hours.   BNP Recent Labs  Lab 09/27/19 0534 09/28/19 0607 09/29/19 0808  BNP 101.6* 486.7* 359.9*     DDimer No results for input(s): DDIMER in the last 168 hours.   Radiology    No results found.  Cardiac Studies   Echo 08/12/19 1. Left ventricular ejection fraction, by estimation, is 50 to 55%. The  left ventricle has low normal function. Left ventricular endocardial  border not optimally defined to evaluate regional wall  motion. There is  mild left ventricular hypertrophy. Left  ventricular diastolic parameters are indeterminate.  2. Right ventricular systolic function is normal. The right ventricular  size is normal. There is moderately elevated pulmonary artery systolic  pressure.  3. Left atrial size was severely dilated.  4. Right atrial size was mildly dilated.  5. The mitral valve is normal in structure. Moderate mitral valve  regurgitation. No evidence of mitral stenosis.  6. Tricuspid valve regurgitation is severe.  7. The aortic valve is normal in structure. Aortic valve regurgitation is  trivial. Mild aortic valve sclerosis is present, with no evidence of  aortic valve stenosis.  8. Moderately dilated pulmonary artery.  9. The inferior vena cava is normal in size with <50% respiratory  variability, suggesting right atrial pressure of 8 mmHg.  Comparison(s): Previous Echo showed LV EF 50-55%, no RWMA, moderately  increased RV wall thickness, trivial AI, moderate LAE.   Medtronic PPM change-out 09/05/19 Preoperative diagnosis Battery approaching ERI CHF atrial and ventricular lead insertion reversal Procedure: Generator replacement ; pocket revision ; lead repair Interrogation of the previously implanted ventricular lead St Jude demonstrated an R wave of 14 millivolts., and impedance of 608 ohms, and a pacing threshold of 0.3 volts at 0.5 msec. This LEAD WAS REMOVED FROM THE ATRIAL PORT The previously implanted atrial lead St Jude demonstrated a P-wave amplitude of 9 milllivolts and impedance of 493 ohms, and a pacing threshold of 0.4 volts at @ 0.82milliseconds. Repair was needed with heme discoloration and the lead was optim covered and medical adhesive was applied The leads were inspected. The leads were then attached to a Medtronic pulse generator, serial number [id number] Following insertion of the device back in the pocket changes in the atrial lead were further noted 1)  Bipolar impedance [id number]--- lead reprogramed unipolar pacing impedance 456  2) threshold bip 0.5@ 0.4 >>1.25 @ 0.5 bip ---- 0.75 @ 1.0. unipolar Pocket was revised to allow for medial translocation of the device  Patient Profile     79 y.o. female with history of CAD s/p 3-vessel CABG with subsequent PCI  x 5, HFpEF, pulmonary hypertension, sinus node dysfunction s/p MDT PPM with lead revision and generator change out in 08/2019, DM2, HTN, HLD, anemia, and dementia who we are seeing for acute on chronic diastolic CHF and new onset Afib.  Assessment & Plan    1. Acute on chronic HFpEF/pulmonary hypertension: -She appears euvolemic  -Net negative 5.5 L -Continue torsemide 60 mg daily with KCl repletion  -Daily weights -Strict I/O  2. New onset Afib: -She remains in Afib with controlled ventricular rates -Continue metoprolol and amiodarone  -CHADS2VASc at least 7 (CHF, HTN, age x 2, DM, vascular disease, gender) -Eliquis  -Plan for outpatient follow up to discuss possible rhythm control  3. CAD s/p CABG s/p PCI: -No symptoms concerning for angina -On Eliquis now in place of ASA -Metoprolol -Crestor  -No plans for inpatient ischemic evaluation   4. Sinus node dysfunction: -Device appears to be functioning normally -Follow up with EP  5. Anemia: -HGB stable on Deputy  For questions or updates, please contact [place] Please consult [email or web address] for contact info under Cardiology/STEMI.    Signed, Christell Faith, PA-C [facility] Pager: [phone] 09/30/2019, 11:56 AM

## 2019-09-30 NOTE — Care Management Important Message (Signed)
Important Message  Patient Details  Name: Kristi [name] MRN: [id number] Date of Birth: 05-15-1940   Medicare Important Message Given:  Yes     Dannette Barbara 09/30/2019, 12:06 PM

## 2019-09-30 NOTE — Progress Notes (Signed)
Assumed care of patient, agree with RNs shift assessment, pt expressed no needs at this time, awaiting for daughter to arrive to discharge.

## 2019-09-30 NOTE — TOC Progression Note (Signed)
Transition of Care [facility]) - Progression Note    Patient Details  Name: Kristi [name] MRN: [id number] Date of Birth: 01-04-1941  Transition of [facility]) CM/SW Contact  Shelbie Ammons, RN Phone Number: 09/30/2019, 1:22 PM  Clinical Narrative:  RNCM met with patient at bedside and scale was provided. Patient is still wearing oxygen and reports she does not wear at home. Discussed with patient that if oxygen is needed at discharge this can be provided. RNCM will remain available for any needs.      Expected Discharge Plan: [place] Barriers to Discharge: Continued Medical Work up  Expected Discharge Plan and Services Expected Discharge Plan: [place] In-house Referral: Clinical Social Work   Post Acute Care Choice: [place] arrangements for the past 2 months: [place]: RN, PT [place] Agency: [place] ([place]) Date [place]: 09/26/19 Time [place]: 1522 Representative spoke with at [place]: [place] ([place]) Interventions    Readmission Risk Interventions No flowsheet data found.

## 2019-09-30 NOTE — Progress Notes (Signed)
SATURATION QUALIFICATIONS: (This note is used to comply with regulatory documentation for home oxygen)  Patient Saturations on Room Air at Rest = 100%  Patient Saturations on Room Air while Ambulating = 100%  

## 2019-09-30 NOTE — Plan of Care (Signed)
  Problem: Education: Goal: Knowledge of General Education information will improve Description: Including pain rating scale, medication(s)/side effects and non-pharmacologic comfort measures Outcome: Adequate for Discharge   Problem: Health Behavior/Discharge Planning: Goal: Ability to manage health-related needs will improve Outcome: Adequate for Discharge   Problem: Clinical Measurements: Goal: Ability to maintain clinical measurements within normal limits will improve Outcome: Adequate for Discharge Goal: Will remain free from infection Outcome: Adequate for Discharge Goal: Diagnostic test results will improve Outcome: Adequate for Discharge Goal: Respiratory complications will improve Outcome: Adequate for Discharge Goal: Cardiovascular complication will be avoided Outcome: Adequate for Discharge   Problem: Activity: Goal: Risk for activity intolerance will decrease Outcome: Adequate for Discharge   Problem: Nutrition: Goal: Adequate nutrition will be maintained Outcome: Adequate for Discharge   Problem: Coping: Goal: Level of anxiety will decrease Outcome: Adequate for Discharge   Problem: Elimination: Goal: Will not experience complications related to bowel motility Outcome: Adequate for Discharge Goal: Will not experience complications related to urinary retention Outcome: Adequate for Discharge   Problem: Pain Managment: Goal: General experience of comfort will improve Outcome: Adequate for Discharge   Problem: Safety: Goal: Ability to remain free from injury will improve Outcome: Adequate for Discharge   Problem: Skin Integrity: Goal: Risk for impaired skin integrity will decrease Outcome: Adequate for Discharge   Problem: Education: Goal: Ability to demonstrate management of disease process will improve Outcome: Adequate for Discharge Goal: Ability to verbalize understanding of medication therapies will improve Outcome: Adequate for Discharge Goal:  Individualized Educational Video(s) Outcome: Adequate for Discharge   Problem: Activity: Goal: Capacity to carry out activities will improve Outcome: Adequate for Discharge   Problem: Cardiac: Goal: Ability to achieve and maintain adequate cardiopulmonary perfusion will improve Outcome: Adequate for Discharge   Problem: Acute Rehab PT Goals(only PT should resolve) Goal: Patient Will Transfer Sit To/From Stand Outcome: Adequate for Discharge Goal: Pt Will Transfer Bed To Chair/Chair To Bed Outcome: Adequate for Discharge Goal: Pt Will Ambulate Outcome: Adequate for Discharge Goal: Pt/caregiver will Perform Home Exercise Program Outcome: Adequate for Discharge   Problem: Acute Rehab OT Goals (only OT should resolve) Goal: Pt. Will Perform Lower Body Dressing Outcome: Adequate for Discharge Goal: Pt. Will Transfer To Toilet Outcome: Adequate for Discharge Goal: OT Additional ADL Goal #1 Outcome: Adequate for Discharge
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
## 2019-10-01 ENCOUNTER — Encounter: Payer: Medicare Other | Admitting: Internal Medicine

## 2019-10-01 NOTE — Telephone Encounter (Signed)
Anderson Malta  can you see if we can arrange telehealth visit  on thrudsay  Thanks SK

## 2019-10-02 NOTE — Telephone Encounter (Signed)
Attempted to schedule.  No ans no vm  

## 2019-10-02 NOTE — Telephone Encounter (Signed)
lmov to call office to add on telehealth visit

## 2019-10-03 ENCOUNTER — Encounter: Payer: Self-pay | Admitting: Internal Medicine

## 2019-10-03 ENCOUNTER — Telehealth (INDEPENDENT_AMBULATORY_CARE_PROVIDER_SITE_OTHER): Payer: Medicare Other | Admitting: Internal Medicine

## 2019-10-03 VITALS — Ht 66.0 in | Wt 190.0 lb

## 2019-10-03 DIAGNOSIS — I495 Sick sinus syndrome: Secondary | ICD-10-CM | POA: Diagnosis not present

## 2019-10-03 DIAGNOSIS — T82110D Breakdown (mechanical) of cardiac electrode, subsequent encounter: Secondary | ICD-10-CM

## 2019-10-03 DIAGNOSIS — I5032 Chronic diastolic (congestive) heart failure: Secondary | ICD-10-CM | POA: Diagnosis not present

## 2019-10-03 DIAGNOSIS — I440 Atrioventricular block, first degree: Secondary | ICD-10-CM

## 2019-10-03 DIAGNOSIS — Z950 Presence of cardiac pacemaker: Secondary | ICD-10-CM

## 2019-10-03 DIAGNOSIS — I4811 Longstanding persistent atrial fibrillation: Secondary | ICD-10-CM

## 2019-10-03 NOTE — Progress Notes (Signed)
Electrophysiology TeleHealth Note   Due to national recommendations of social distancing due to COVID 19, an audio/video telehealth visit is felt to be most appropriate for this patient at this time.  See MyChart message from today for the patient's consent to telehealth for [facility].   Date:  10/03/2019   ID:  Kristi [name], DOB 08-09-1940, MRN [id number]  Location: patient's home  Provider location: [address], [place]  Evaluation Performed: Follow-up visit  PCP:  Lorelee Market, MD  Cardiologist:   BAE Electrophysiologist:  SK   Chief Complaint:  Heart failoure   History of Present Illness:    Kristi [name] is a 79 y.o. female who presents via audio/video conferencing for a telehealth visit today.  Since last being seen in our clinic for review of a pacemaker implanted 2008 with generator change 2014 at which time the atrial and ventricular leads were inadvertently placed in the wrong ports with associated complaints of dyspnea on exertion and edema occurring in the context of ischemic heart disease prior CABG and interval stenting, the patient reports doing well   Interval hospitalization 09/25/2019 for atrial fibrillation with a rapid rate and acute congestive heart failure.  Started on amiodarone and Eliquis and discharged in atrial fibrillation with a controlled ventricular response  Her breathing is better, her shoes fit :))  Tolerating meds Patient denies symptoms of GI intolerance, sun sensitivity, neurological symptoms attributable to amiodarone.      DATE TEST EF   7/18 Myoview  No ischemia ( by report)   1/19 Echo   65 %   5/21 Echo   50-55 % LAE-severe(4.57mm///68 ml/m2)) Pulm Htn PA >56  5/21 Myoview 55-65% Perfusion defect  Artifact v scar         Date Cr K TSH Hgb  9/20 1.1 3.9  12.4   7/21   5.9<11.3       The patient denies symptoms of fevers, chills, cough, or new SOB worrisome for COVID 19.    Past Medical  History:  Diagnosis Date  . Anxiety   . Arthritis   . Broken arm    left FA 3/17  . Broken arm    left  . CAD (coronary artery disease)    s/p MI  . Cancer ([place])    uterine  . Cervical disc disorder   . Depression   . Diabetes mellitus without complication ([place])   . GERD (gastroesophageal reflux disease)   . Headache   . Hyperlipidemia   . Hypertension   . Hypothyroid   . Myocardial infarction ([place])    15 year ago  . Neck pain 06/04/2014  . Parkinson's disease ([place])   . Presence of permanent cardiac pacemaker   . Spinal stenosis     Past Surgical History:  Procedure Laterality Date  . ABDOMINAL HYSTERECTOMY     partial  . BREAST SURGERY    . COLONOSCOPY    . COLONOSCOPY WITH PROPOFOL N/A 11/29/2017   Procedure: COLONOSCOPY WITH PROPOFOL;  Surgeon: [name], MD;  Location: [facility] ENDOSCOPY;  Service: Endoscopy;  Laterality: N/A;  . CORONARY ANGIOPLASTY     stent  . CORONARY ARTERY BYPASS GRAFT    . CORONARY STENT PLACEMENT    . ESOPHAGOGASTRODUODENOSCOPY (EGD) WITH PROPOFOL N/A 04/18/2017   Procedure: ESOPHAGOGASTRODUODENOSCOPY (EGD) WITH PROPOFOL;  Surgeon: [name], MD;  Location: [facility] ENDOSCOPY;  Service: Endoscopy;  Laterality: N/A;  . ESOPHAGOGASTRODUODENOSCOPY (EGD) WITH PROPOFOL N/A 11/29/2017   Procedure:  ESOPHAGOGASTRODUODENOSCOPY (EGD) WITH PROPOFOL;  Surgeon: [name], MD;  Location: [facility] ENDOSCOPY;  Service: Endoscopy;  Laterality: N/A;  . INSERT / REPLACE / REMOVE PACEMAKER    . PPM GENERATOR CHANGEOUT N/A 09/05/2019   Procedure: PPM GENERATOR CHANGEOUT;  Surgeon: [name], MD;  Location: [place] CV LAB;  Service: Cardiovascular;  Laterality: N/A;  . s/p pacer insertion    . TRANSANAL EXCISION OF RECTAL MASS N/A 01/17/2018   Procedure: TRANSANAL EXCISION OF RECTAL POLYP;  Surgeon: [name], MD;  Location: [facility] ORS;  Service: General;  Laterality: N/A;    Current Outpatient Medications  Medication Sig Dispense Refill   . amiodarone (PACERONE) 200 MG tablet Take 1 tablet (200 mg total) by mouth 2 (two) times daily. 30 tablet 0  . apixaban (ELIQUIS) 5 MG TABS tablet Take 1 tablet (5 mg total) by mouth 2 (two) times daily. 60 tablet 0  . cholecalciferol (VITAMIN D3) 25 MCG (1000 UNIT) tablet Take 1,000 Units by mouth daily.    . cholestyramine (QUESTRAN) 4 g packet One packet daily in water (Patient taking differently: Take 4 g by mouth daily. in water) 30 each 11  . docusate sodium (COLACE) 100 MG capsule Take 100 mg by mouth daily.    [name] donepezil (ARICEPT) 10 MG tablet Take 10 mg by mouth daily.     [name] gabapentin (NEURONTIN) 300 MG capsule Take 300-600 mg by mouth See admin instructions. Take 300 mg in the morning and 600 mg at night    . hydrOXYzine (ATARAX/VISTARIL) 25 MG tablet Take 25 mg by mouth 2 (two) times daily.    [name] levothyroxine (SYNTHROID) 200 MCG tablet Take 200 mcg by mouth daily.     [name] LINZESS 72 MCG capsule Take 72 mcg by mouth every morning.    . loratadine (CLARITIN) 10 MG tablet Take 10 mg by mouth daily.     . meclizine (ANTIVERT) 12.5 MG tablet Take 1 tablet (12.5 mg total) by mouth 2 (two) times daily as needed for dizziness. 30 tablet 0  . metoprolol tartrate (LOPRESSOR) 25 MG tablet Take 0.5 tablets (12.5 mg total) by mouth 2 (two) times daily. 30 tablet 0  . montelukast (SINGULAIR) 10 MG tablet Take 10 mg by mouth at bedtime.     . Multiple Vitamin (MULTIVITAMIN WITH MINERALS) TABS tablet Take 1 tablet by mouth daily.    . nitroGLYCERIN (NITROSTAT) 0.4 MG SL tablet Place 0.4 mg under the tongue every 5 (five) minutes as needed for chest pain.     [name] OLANZapine (ZYPREXA) 2.5 MG tablet Take 2.5 mg by mouth in the morning and at bedtime.     . Omega-3 Fatty Acids (FISH OIL) 1000 MG CAPS Take 1,000 mg by mouth daily.    [name] omeprazole (PRILOSEC) 40 MG capsule Take 40 mg by mouth 2 (two) times daily.    . pioglitazone (ACTOS) 30 MG tablet Take 30 mg by mouth daily.     . potassium chloride SA  (K-DUR,KLOR-CON) 20 MEQ tablet Take 20 mEq by mouth 2 (two) times daily.     . rosuvastatin (CRESTOR) 40 MG tablet Take 40 mg by mouth daily.    . sertraline (ZOLOFT) 100 MG tablet Take 100 mg by mouth 2 (two) times daily.    [name] torsemide (DEMADEX) 20 MG tablet Take 3 tablets (60 mg total) by mouth daily. 90 tablet 0  . traMADol (ULTRAM) 50 MG tablet Take 50 mg by mouth 3 (three) times daily as  needed for moderate pain.    . vitamin B-12 (CYANOCOBALAMIN) 1000 MCG tablet Take 1,000 mcg by mouth daily.    . vitamin E 180 MG (400 UNITS) capsule Take 400 Units by mouth daily.     No current facility-administered medications for this visit.    Allergies:   Penicillins   Social History:  The patient  reports that she has never smoked. She has never used smokeless tobacco. She reports that she does not drink alcohol and does not use drugs.   Family History:  The patient's   family history includes Aneurysm in her mother; Cancer in her mother; Depression in her mother; Diabetes in her brother, brother, mother, and sister; Heart disease in her father; Hypertension in her mother.   ROS:  Please see the history of present illness.   All other systems are personally reviewed and negative.    Exam:    Vital Signs:  Ht 5\' 6"  (1.676 m)   Wt 190 lb (86.2 kg)   BMI 30.67 kg/m     Labs/Other Tests and Data Reviewed:    Recent Labs: 11/28/2018: ALT 18 09/25/2019: TSH 5.933 09/29/2019: B Natriuretic Peptide 359.9 09/30/2019: BUN 29; Creatinine, Ser 1.00; Hemoglobin 11.2; Platelets 187; Potassium 4.5; Sodium 139   Wt Readings from Last 3 Encounters:  10/03/19 190 lb (86.2 kg)  09/29/19 198 lb 9.6 oz (90.1 kg)  09/05/19 189 lb (85.7 kg)     Other studies personally reviewed: Additional studies/ records that were reviewed today include: As above hospital records and MD notes    ASSESSMENT & PLAN:   Sinus node dysfunction  First-degree AV block  Pacemaker-[employer]tr[employer] with inverted atrial and  ventricular leads-status post revision 6/21  Congestive heart failure /chronic/diastolic  Atrial fibrillation-persistent  High Risk Medication Surveillance amiodarone  Hypothyroidism- treated     Acute HR Much improved following diuresis;  H  Afib HR controlled and will stop low dose metoprolol Not sure I would have enjoined amiodarone as therapy with first episode of afib so will plan to stop amio after cardioversion and use the pacemaker to follow afib burden.  It might be reasonable to resume metoprolol following the discontinuation of the amio As above   Stopping amio will also hopefully help minimize the worsening of hypothyroidism   On Anticoagulation;  No bleeding issues     Follow-up:  2 weeks to APP to schedule DCCV     Current medicines are reviewed at length with the patient today.   The patient does not have concerns regarding her medicines.  The following changes were made today:  Stop metoprolol; decrease amiodarone to 200  Mg daily on 7/30 ( or at office followup)   Labs/ tests ordered today include:  None  No orders of the defined types were placed in this encounter.     Patient Risk:  after full review of this patients clinical status, I feel that they are at moderate risk at this time.  Today, I have spent 8 minutes with the patient with telehealth technology discussing the above.  Signed, Virl Axe, MD  10/03/2019 11:02 AM     [facility] [address] [place] [address] [phone] (office) [phone] (fax)

## 2019-10-03 NOTE — Progress Notes (Signed)
Called patient to go over AVS from visit today.  She verbalized understanding to stop metoprolol. She is scheduled to come back on 10/17/19 to see Laurann Montana, NP. She will call the transportation service she uses to arrange for this.  AVS mailed to patient.

## 2019-10-03 NOTE — Patient Instructions (Addendum)
Medication Instructions:  Your physician has recommended you make the following change in your medication:  1- STOP metoprolol.   *If you need a refill on your cardiac medications before your next appointment, please call your pharmacy*  Lab Work: none If you have labs (blood work) drawn today and your tests are completely normal, you will receive your results only by: [name] MyChart Message (if you have MyChart) OR . A paper copy in the mail If you have any lab test that is abnormal or we need to change your treatment, we will call you to review the results.  Testing/Procedures: none  Follow-Up: At [facility], you and your health needs are our priority.  As part of our continuing mission to provide you with exceptional heart care, we have created designated Provider Care Teams.  These Care Teams include your primary Cardiologist (physician) and Advanced Practice Providers (APPs -  Physician Assistants and Nurse Practitioners) who all work together to provide you with the care you need, when you need it.  We recommend signing up for the patient portal called "MyChart".  Sign up information is provided on this After Visit Summary.  MyChart is used to connect with patients for Virtual Visits (Telemedicine).  Patients are able to view lab/test results, encounter notes, upcoming appointments, etc.  Non-urgent messages can be sent to your provider as well.   To learn more about what you can do with MyChart, go to [email or web address].    Your next appointment:   2 week(s)  The format for your next appointment:   In Person  Provider:    You may see one of the following Advanced Practice Providers on your designated Care Team:    Murray Hodgkins, NP  Christell Faith, PA-C  Marrianne Mood, PA-C  Owens Shark, NP

## 2019-10-04 ENCOUNTER — Telehealth: Payer: Self-pay | Admitting: Family

## 2019-10-04 NOTE — Telephone Encounter (Signed)
LVM with patient regarding her new patient CHF Clinic appointment for 8/4 that was made after her recent hospital discharge.   Lasaro Primm, NT

## 2019-10-08 ENCOUNTER — Emergency Department
Admission: EM | Admit: 2019-10-08 | Discharge: 2019-10-08 | Disposition: A | Discharge status: Home / Self Care | Payer: Medicare Other | Attending: Emergency Medicine | Admitting: Emergency Medicine

## 2019-10-08 ENCOUNTER — Other Ambulatory Visit: Payer: Self-pay

## 2019-10-08 ENCOUNTER — Encounter: Payer: Self-pay | Admitting: Emergency Medicine

## 2019-10-08 ENCOUNTER — Emergency Department: Payer: Medicare Other

## 2019-10-08 DIAGNOSIS — E1165 Type 2 diabetes mellitus with hyperglycemia: Secondary | ICD-10-CM | POA: Insufficient documentation

## 2019-10-08 DIAGNOSIS — E039 Hypothyroidism, unspecified: Secondary | ICD-10-CM | POA: Diagnosis not present

## 2019-10-08 DIAGNOSIS — I110 Hypertensive heart disease with heart failure: Secondary | ICD-10-CM | POA: Insufficient documentation

## 2019-10-08 DIAGNOSIS — G8929 Other chronic pain: Secondary | ICD-10-CM | POA: Diagnosis not present

## 2019-10-08 DIAGNOSIS — Z8542 Personal history of malignant neoplasm of other parts of uterus: Secondary | ICD-10-CM | POA: Diagnosis not present

## 2019-10-08 DIAGNOSIS — I5033 Acute on chronic diastolic (congestive) heart failure: Secondary | ICD-10-CM | POA: Insufficient documentation

## 2019-10-08 DIAGNOSIS — I2510 Atherosclerotic heart disease of native coronary artery without angina pectoris: Secondary | ICD-10-CM | POA: Diagnosis not present

## 2019-10-08 DIAGNOSIS — Z7901 Long term (current) use of anticoagulants: Secondary | ICD-10-CM | POA: Insufficient documentation

## 2019-10-08 DIAGNOSIS — I4891 Unspecified atrial fibrillation: Secondary | ICD-10-CM | POA: Diagnosis present

## 2019-10-08 DIAGNOSIS — Z955 Presence of coronary angioplasty implant and graft: Secondary | ICD-10-CM | POA: Diagnosis not present

## 2019-10-08 DIAGNOSIS — M545 Low back pain: Secondary | ICD-10-CM | POA: Insufficient documentation

## 2019-10-08 DIAGNOSIS — Z79899 Other long term (current) drug therapy: Secondary | ICD-10-CM | POA: Insufficient documentation

## 2019-10-08 DIAGNOSIS — R531 Weakness: Secondary | ICD-10-CM

## 2019-10-08 DIAGNOSIS — G20 Parkinson's disease: Secondary | ICD-10-CM | POA: Diagnosis not present

## 2019-10-08 LAB — URINALYSIS, COMPLETE (UACMP) WITH MICROSCOPIC
Bacteria, UA: NONE SEEN
Bilirubin Urine: NEGATIVE
Glucose, UA: >=500 mg/dL — AB
Hgb urine dipstick: NEGATIVE
Ketones, ur: NEGATIVE mg/dL
Leukocytes,Ua: NEGATIVE
Nitrite: NEGATIVE
Protein, ur: NEGATIVE mg/dL
Specific Gravity, Urine: 1.005 (ref 1.005–1.030)
Squamous Epithelial / HPF: NONE SEEN (ref 0–5)
pH: 5 (ref 5.0–8.0)

## 2019-10-08 LAB — BASIC METABOLIC PANEL
Anion gap: 16 — ABNORMAL HIGH (ref 5–15)
BUN: 41 mg/dL — ABNORMAL HIGH (ref 8–23)
CO2: 27 mmol/L (ref 22–32)
Calcium: 9.6 mg/dL (ref 8.9–10.3)
Chloride: 92 mmol/L — ABNORMAL LOW (ref 98–111)
Creatinine, Ser: 1.39 mg/dL — ABNORMAL HIGH (ref 0.44–1.00)
GFR calc Af Amer: 41 mL/min — ABNORMAL LOW (ref >60)
GFR calc non Af Amer: 35 mL/min — ABNORMAL LOW (ref >60)
Glucose, Bld: 354 mg/dL — ABNORMAL HIGH (ref 70–99)
Potassium: 3.2 mmol/L — ABNORMAL LOW (ref 3.5–5.1)
Sodium: 135 mmol/L (ref 135–145)

## 2019-10-08 LAB — CBC
HCT: 31.8 % — ABNORMAL LOW (ref 36.0–46.0)
Hemoglobin: 10.5 g/dL — ABNORMAL LOW (ref 12.0–15.0)
MCH: 30.4 pg (ref 26.0–34.0)
MCHC: 33 g/dL (ref 30.0–36.0)
MCV: 92.2 fL (ref 80.0–100.0)
Platelets: 225 10*3/uL (ref 150–400)
RBC: 3.45 MIL/uL — ABNORMAL LOW (ref 3.87–5.11)
RDW: 15 % (ref 11.5–15.5)
WBC: 8.2 10*3/uL (ref 4.0–10.5)
nRBC: 0 % (ref 0.0–0.2)

## 2019-10-08 LAB — GLUCOSE, CAPILLARY: Glucose-Capillary: 361 mg/dL — ABNORMAL HIGH (ref 70–99)

## 2019-10-08 LAB — TROPONIN I (HIGH SENSITIVITY): Troponin I (High Sensitivity): 15 ng/L (ref <18)

## 2019-10-08 MED ORDER — SODIUM CHLORIDE 0.9 % IV BOLUS
500.0000 mL | Freq: Once | INTRAVENOUS | Status: AC
  Administered 2019-10-08: 500 mL via INTRAVENOUS

## 2019-10-08 MED ORDER — INSULIN ASPART 100 UNIT/ML ~~LOC~~ SOLN
8.0000 [IU] | Freq: Once | SUBCUTANEOUS | Status: AC
  Administered 2019-10-08: 8 [IU] via INTRAVENOUS
  Filled 2019-10-08: qty 1

## 2019-10-08 NOTE — ED Triage Notes (Signed)
Pt in via POV, brought over from [facility] where she was seeing PCP in regards to chronic back pain; sent over here for evaluation of Afibb.  Pt complains of feeling has if her heart is racing, denies any chest pain.  Per pt report, pacemaker replacement done approximately four weeks ago.  NAD noted at this time.

## 2019-10-08 NOTE — ED Notes (Signed)
Pt sent to er for eval of racing heart from [facility].  No chest pain or sob.  Hx chf,afib  No fever or cough.  Pt reports felling weak for a few days.  Pt alert  Speech clear.  md at bedside.

## 2019-10-08 NOTE — ED Provider Notes (Signed)
[facility] Emergency Department Provider Note  Time seen: 8:02 PM  I have reviewed the triage vital signs and the nursing notes.   HISTORY  Chief Complaint Atrial Fibrillation  HPI Kristi [name] is a 79 y.o. female with a past medical history of anxiety, arthritis, diabetes, gastric reflux, hypertension, hyperlipidemia, newly diagnosed atrial fibrillation currently on amiodarone and Eliquis presents to the emergency department for atrial fibrillation.  According to the patient she was being seen by her PM&R physician for her chronic back pain for which she has been following up for many months per patient.   Patient states while there she was saying she did not feel very well, which the patient states because her back has been hurting her.  States they listen to her heart and told her that it was fast and she needed to go to the emergency department.  Patient denies any chest pain or shortness of breath.  Heart rate in the mid to upper 90s currently.  Denies any abdominal pain nausea vomiting diarrhea or fever cough or congestion.  Patient states she is currently taking fluid pills and has lost approximately 20 pounds of fluid over the past several weeks.  Past Medical History:  Diagnosis Date  . Anxiety   . Arthritis   . Broken arm    left FA 3/17  . Broken arm    left  . CAD (coronary artery disease)    s/p MI  . Cancer ([place])    uterine  . Cervical disc disorder   . Depression   . Diabetes mellitus without complication ([place])   . GERD (gastroesophageal reflux disease)   . Headache   . Hyperlipidemia   . Hypertension   . Hypothyroid   . Myocardial infarction ([place])    15 year ago  . Neck pain 06/04/2014  . Parkinson's disease ([place])   . Presence of permanent cardiac pacemaker   . Spinal stenosis     Patient Active Problem List   Diagnosis Date Noted  . PAF (paroxysmal atrial fibrillation) ([place]) 09/27/2019  . CHF (congestive heart failure) ([place])  09/26/2019  . Acute on chronic diastolic heart failure ([place]) 09/25/2019  . Hypertensive urgency 09/25/2019  . Fluid overload 09/25/2019  . Excessive gas 05/14/2019  . Generalized bloating 05/14/2019  . Rectal bleeding 05/14/2019  . 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 Chest pain 11/20/2018  . Difficulty walking 11/19/2018  . Insomnia 11/15/2018  . Type 2 diabetes mellitus ([place]) 11/15/2018  . Goals of care, counseling/discussion 02/06/2018  . 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 Rectal mass 01/05/2018  . Leg pain, bilateral 12/06/2017  . Lymphedema 07/10/2017  . Stricture and stenosis of esophagus   . Dysphagia   . Syncope and collapse 04/11/2017  . Mild dementia ([place]) 01/24/2017  . Lumbar facet osteoarthritis (Bilateral) 01/17/2017  . Acute postoperative pain 01/17/2017  . Hypokalemia 12/24/2016  . Anxiety 12/07/2016  . 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 Cerebrovascular accident ([place]) 12/07/2016  . Chronic depression 12/07/2016  . Essential hypertension 12/07/2016  . Hypothyroidism 12/07/2016  . Mixed hyperlipidemia 12/07/2016  . Myocardial infarction ([place]) 12/07/2016  . TIA (transient ischemic attack) 11/17/2016  . Parkinson disease ([place]) 10/27/2016  . Tremor 10/27/2016  . Chronic knee pain (B) (R>L) 10/19/2016  . Ischemic chest pain ([place]) 09/29/2016  . Abnormal CT scan, lumbar spine 09/08/2016  . Lumbar foraminal stenosis (multilevel) 09/08/2016  . Fracture of clavicle 08/03/2016  .  Polyneuropathy 05/17/2016  . Neurogenic pain 04/12/2016  . Chronic lower extremity pain (Bilateral) (R>L) 04/12/2016  . Chronic lower extremity radicular pain (Primary Source of Pain) (Bilateral) (R>L) 04/12/2016  . Lower extremity weakness 04/12/2016  . Chronic low back pain (Secondary source of pain) (Bilateral) (R>L) 04/12/2016  . Chronic wrist pain (Left) (secondary to fracture) 04/12/2016  . Lumbar spondylosis 04/12/2016   . Chronic pain syndrome 04/11/2016  . Long term current use of opiate analgesic 04/11/2016  . Long term prescription opiate use 04/11/2016  . Opiate use 04/11/2016  . Long term prescription benzodiazepine use 04/11/2016  . Lumbar radiculopathy (Multilevel) (Bilateral) 05/07/2015  . Lumbar facet syndrome (Bilateral) (R>L) 08/21/2014  . 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 Sacroiliac joint dysfunction (Bilateral) 08/13/2014  . Frequent falls 06/04/2014  . Memory loss 06/04/2014  . Anxiety and depression 08/22/2013  . CAD (coronary artery disease) 08/22/2013  . Therapeutic opioid-induced constipation (OIC) 08/22/2013  . GERD (gastroesophageal reflux disease) 08/22/2013    Past Surgical History:  Procedure Laterality Date  . ABDOMINAL HYSTERECTOMY     partial  . BREAST SURGERY    . COLONOSCOPY    . COLONOSCOPY WITH PROPOFOL N/A 11/29/2017   Procedure: COLONOSCOPY WITH PROPOFOL;  Surgeon: [name], MD;  Location[facility] ENDOSCOPY;  Service: Endoscopy;  Laterality: N/A;  . CORONARY ANGIOPLASTY     stent  . CORONARY ARTERY BYPASS GRAFT    . CORONARY STENT PLACEMENT    . ESOPHAGOGASTRODUODENOSCOPY (EGD) WITH PROPOFOL N/A 04/18/2017   Procedure: ESOPHAGOGASTRODUODENOSCOPY (EGD) WITH PROPOFOL;  Surgeon: [name], MD;  Location: [facility] ENDOSCOPY;  Service: Endoscopy;  Laterality: N/A;  . ESOPHAGOGASTRODUODENOSCOPY (EGD) WITH PROPOFOL N/A 11/29/2017   Procedure: ESOPHAGOGASTRODUODENOSCOPY (EGD) WITH PROPOFOL;  Surgeon: [name], MD;  Location[facility] ENDOSCOPY;  Service: Endoscopy;  Laterality: N/A;  . INSERT / REPLACE / REMOVE PACEMAKER    . PPM GENERATOR CHANGEOUT N/A 09/05/2019   Procedure: PPM GENERATOR CHANGEOUT;  Surgeon: [name], MD;  Location: [facility] CV LAB;  Service: Cardiovascular;  Laterality: N/A;  . s/p pacer insertion    . TRANSANAL EXCISION OF RECTAL MASS N/A 01/17/2018   Procedure: TRANSANAL EXCISION OF RECTAL  POLYP;  Surgeon: [name], MD;  Location: [facility] ORS;  Service: General;  Laterality: N/A;    Prior to Admission medications   Medication Sig Start Date End Date Taking? Authorizing Provider  amiodarone (PACERONE) 200 MG tablet Take 1 tablet (200 mg total) by mouth 2 (two) times daily. 09/30/19   Jennye Boroughs, MD  apixaban (ELIQUIS) 5 MG TABS tablet Take 1 tablet (5 mg total) by mouth 2 (two) times daily. 09/30/19   Jennye Boroughs, MD  cholecalciferol (VITAMIN D3) 25 MCG (1000 UNIT) tablet Take 1,000 Units by mouth daily.    [provider]  cholestyramine (QUESTRAN) 4 g packet One packet daily in water Patient taking differently: Take 4 g by mouth daily. in water 01/01/19   Byrnett, Forest Gleason, MD  docusate sodium (COLACE) 100 MG capsule Take 100 mg by mouth daily.    [provider]  donepezil (ARICEPT) 10 MG tablet Take 10 mg by mouth daily.     [provider]  gabapentin (NEURONTIN) 300 MG capsule Take 300-600 mg by mouth See admin instructions. Take 300 mg in the morning and 600 mg at night 04/24/17   [provider]  hydrOXYzine (ATARAX/VISTARIL) 25 MG tablet Take 25 mg by mouth 2 (two) times daily.  [provider]  levothyroxine (SYNTHROID) 200 MCG tablet Take 200 mcg by mouth daily.     [provider]  LINZESS 72 MCG capsule Take 72 mcg by mouth every morning. 09/10/19   [provider]  loratadine (CLARITIN) 10 MG tablet Take 10 mg by mouth daily.     [provider]  meclizine (ANTIVERT) 12.5 MG tablet Take 1 tablet (12.5 mg total) by mouth 2 (two) times daily as needed for dizziness. 09/30/19   Jennye Boroughs, MD  montelukast (SINGULAIR) 10 MG tablet Take 10 mg by mouth at bedtime.  07/23/19   [provider]  Multiple Vitamin (MULTIVITAMIN WITH MINERALS) TABS tablet Take 1 tablet by mouth daily.    [provider]  nitroGLYCERIN (NITROSTAT) 0.4 MG SL tablet Place 0.4 mg under the tongue  every 5 (five) minutes as needed for chest pain.     [provider]  OLANZapine (ZYPREXA) 2.5 MG tablet Take 2.5 mg by mouth in the morning and at bedtime.     [provider]  Omega-3 Fatty Acids (FISH OIL) 1000 MG CAPS Take 1,000 mg by mouth daily.    [provider]  omeprazole (PRILOSEC) 40 MG capsule Take 40 mg by mouth 2 (two) times daily. 10/22/18   [provider]  pioglitazone (ACTOS) 30 MG tablet Take 30 mg by mouth daily.  07/23/19   [provider]  potassium chloride SA (K-DUR,KLOR-CON) 20 MEQ tablet Take 20 mEq by mouth 2 (two) times daily.  06/29/17   [provider]  rosuvastatin (CRESTOR) 40 MG tablet Take 40 mg by mouth daily. 10/22/18   [provider]  sertraline (ZOLOFT) 100 MG tablet Take 100 mg by mouth 2 (two) times daily. 11/19/18 11/19/19  [provider]  torsemide (DEMADEX) 20 MG tablet Take 3 tablets (60 mg total) by mouth daily. 09/30/19   Jennye Boroughs, MD  traMADol (ULTRAM) 50 MG tablet Take 50 mg by mouth 3 (three) times daily as needed for moderate pain.    [provider]  vitamin B-12 (CYANOCOBALAMIN) 1000 MCG tablet Take 1,000 mcg by mouth daily.    [provider]  vitamin E 180 MG (400 UNITS) capsule Take 400 Units by mouth daily.    [provider]    Allergies  Allergen Reactions  . Penicillins Anaphylaxis, Swelling and Rash    Family History  Problem Relation Age of Onset  . Cancer Mother   . Depression Mother   . Diabetes Mother   . Hypertension Mother   . Aneurysm Mother   . Heart disease Father   . Diabetes Sister   . Diabetes Brother   . Diabetes Brother     Social History Social History   Tobacco Use  . Smoking status: Never Smoker  . Smokeless tobacco: Never Used  Vaping Use  . Vaping Use: Never used  Substance Use Topics  . Alcohol use: No  . Drug use: No    Review of Systems Constitutional: Negative for fever. Cardiovascular:  Negative for chest pain. Respiratory: Negative for shortness of breath. Gastrointestinal: Negative for abdominal pain, vomiting Genitourinary: Negative for urinary compaints Musculoskeletal: Lower back pain, chronic. Neurological: Negative for headache All other ROS negative  ____________________________________________   PHYSICAL EXAM:  VITAL SIGNS: ED Triage Vitals  Enc Vitals Group     BP 10/08/19 1538 (!) 145/78     Pulse Rate 10/08/19 1538 (!) 101     Resp 10/08/19 1538 16  Temp 10/08/19 1538 98.4 F (36.9 C)     Temp Source 10/08/19 1538 Oral     SpO2 10/08/19 1538 98 %     Weight 10/08/19 1541 193 lb (87.5 kg)     Height 10/08/19 1541 5\' 6"  (1.676 m)     Head Circumference --      Peak Flow --      Pain Score 10/08/19 1540 0     Pain Loc --      Pain Edu? --      Excl. in [place]? --     Constitutional: Alert and oriented. Well appearing and in no distress. Eyes: Normal exam ENT      Head: Normocephalic and atraumatic      Mouth/Throat: Mucous membranes are moist. Cardiovascular: Irregular rhythm rate around 90 bpm. Respiratory: Normal respiratory effort without tachypnea nor retractions. Breath sounds are clear  Gastrointestinal: Soft and nontender. No distention.  Musculoskeletal: Nontender with normal range of motion in all extremities. Neurologic:  Normal speech and language. No gross focal neurologic deficits  Skin:  Skin is warm, dry and intact.  Psychiatric: Mood and affect are normal.   ____________________________________________    EKG  EKG viewed and interpreted by myself appears show atrial fibrillation at 105 bpm with a narrow QRS, normal axis, largely normal intervals with nonspecific ST changes.  ____________________________________________    RADIOLOGY  Chest x-ray is negative  ____________________________________________   INITIAL IMPRESSION / ASSESSMENT AND PLAN / ED COURSE  Pertinent labs & imaging results that were available  during my care of the patient were reviewed by me and considered in my medical decision making (see chart for details).   Patient presents to the emergency department from her doctor for an elevated heart rate.  Patient's heart rate currently is around 95 bpm.  Patient is is in atrial fibrillation but she was recently diagnosed with atrial fibrillation is currently taking amiodarone and Eliquis.  Patient's labs do show hyperglycemia with signs of dehydration.  This could be due to overdiuresis.  We will start an IV, dose 500 cc of fluid as well as 6 units of insulin and recheck.  Overall the patient appears well.  From an atrial fibrillation standpoint I believe the patient will be safely discharged home with cardiology follow-up to which the patient is agreeable.  Patient appears well, heart rate currently in the mid 80s.  We will discharge the patient with PCP follow-up.  Vendela Troung was evaluated in Emergency Department on 10/08/2019 for the symptoms described in the history of present illness. She was evaluated in the context of the global COVID-19 pandemic, which necessitated consideration that the patient might be at risk for infection with the SARS-CoV-2 virus that causes COVID-19. Institutional protocols and algorithms that pertain to the evaluation of patients at risk for COVID-19 are in a state of rapid change based on information released by regulatory bodies including the CDC and federal and state organizations. These policies and algorithms were followed during the patient's care in the ED.  ____________________________________________   FINAL CLINICAL IMPRESSION(S) / ED DIAGNOSES  hyperglycemia   Harvest Dark, MD 10/08/19 2221

## 2019-10-08 NOTE — ED Notes (Signed)
fsbs 361  md aware.

## 2019-10-09 ENCOUNTER — Telehealth: Payer: Self-pay | Admitting: Cardiology

## 2019-10-09 NOTE — Telephone Encounter (Signed)
Pt c/o swelling: STAT is pt has developed SOB within 24 hours  1) How much weight have you gained and in what time span? 2.2 lbs in 1-2 days  2) If swelling, where is the swelling located? legs  3) Are you currently taking a fluid pill? Possibly linzess and compression socks  4) Are you currently SOB? yes  5) Do you have a log of your daily weights (if so, list)? n/a  6) Have you gained 3 pounds in a day or 5 pounds in a week? n/a  7) Have you traveled recently? No other than the ED  Patients daughter also states she can't stay awake  Patient has been placed on [place] Schedule for 7/22

## 2019-10-09 NOTE — Telephone Encounter (Signed)
Spoke with patient and she stated she was feeling a little bit better than she was earlier today. She stated she will be coming in tomorrow morning with her daughter to see Dr. Garen [name].

## 2019-10-10 ENCOUNTER — Other Ambulatory Visit: Payer: Self-pay

## 2019-10-10 ENCOUNTER — Ambulatory Visit (INDEPENDENT_AMBULATORY_CARE_PROVIDER_SITE_OTHER): Payer: Medicare Other | Admitting: Cardiology

## 2019-10-10 ENCOUNTER — Encounter: Payer: Self-pay | Admitting: Cardiology

## 2019-10-10 VITALS — BP 110/70 | HR 94 | Ht 66.0 in | Wt 196.0 lb

## 2019-10-10 DIAGNOSIS — Z951 Presence of aortocoronary bypass graft: Secondary | ICD-10-CM

## 2019-10-10 DIAGNOSIS — I480 Paroxysmal atrial fibrillation: Secondary | ICD-10-CM

## 2019-10-10 DIAGNOSIS — I5033 Acute on chronic diastolic (congestive) heart failure: Secondary | ICD-10-CM

## 2019-10-10 DIAGNOSIS — R0609 Other forms of dyspnea: Secondary | ICD-10-CM

## 2019-10-10 DIAGNOSIS — Z950 Presence of cardiac pacemaker: Secondary | ICD-10-CM | POA: Diagnosis not present

## 2019-10-10 DIAGNOSIS — R0600 Dyspnea, unspecified: Secondary | ICD-10-CM | POA: Diagnosis not present

## 2019-10-10 MED ORDER — TORSEMIDE 20 MG PO TABS: 40.0000 mg | ORAL_TABLET | Freq: Two times a day (BID) | ORAL | 3 refills | Status: DC

## 2019-10-10 NOTE — Patient Instructions (Signed)
Medication Instructions:  1- INCREASE Torsemide Take 2 tablets (40 mg total) by mouth 2 (two) times daily. *If you need a refill on your cardiac medications before your next appointment, please call your pharmacy*   Lab Work: None ordered  If you have labs (blood work) drawn today and your tests are completely normal, you will receive your results only by: [name] MyChart Message (if you have MyChart) OR . A paper copy in the mail If you have any lab test that is abnormal or we need to change your treatment, we will call you to review the results.   Testing/Procedures: None ordered   Follow-Up: At [facility], you and your health needs are our priority.  As part of our continuing mission to provide you with exceptional heart care, we have created designated Provider Care Teams.  These Care Teams include your primary Cardiologist (physician) and Advanced Practice Providers (APPs -  Physician Assistants and Nurse Practitioners) who all work together to provide you with the care you need, when you need it.  We recommend signing up for the patient portal called "MyChart".  Sign up information is provided on this After Visit Summary.  MyChart is used to connect with patients for Virtual Visits (Telemedicine).  Patients are able to view lab/test results, encounter notes, upcoming appointments, etc.  Non-urgent messages can be sent to your provider as well.   To learn more about what you can do with MyChart, go to [email or web address].    Your next appointment:   10 day(s)  The format for your next appointment:   In Person  Provider:    You may see Kate Sable, MD or one of the following Advanced Practice Providers on your designated Care Team:    Murray Hodgkins, NP  Christell Faith, PA-C  Marrianne Mood, PA-C

## 2019-10-10 NOTE — Progress Notes (Signed)
Cardiology Office Note:    Date:  10/10/2019   ID:  Kristi [name], DOB 1940-03-25, MRN [id number]  PCP:  Lorelee Market, MD  Cardiologist:  Kate Sable, MD  Electrophysiologist:  None   Referring MD: Lorelee Market, MD   Chief Complaint  Patient presents with  . office visit    Patient reports DOE, weight gain, and edema in LE's and abdomen; Meds verbally reviewed with patient.    History of Present Illness:    Kristi [name] is a 79 y.o. female with a hx of diabetes, A. fib on Eliquis hypertension, hyperlipidemia, CAD, prior PCI x5 stents,CABGx3 over 15 years ago, permanent pacemaker, lymphedema who presents for follow-up.  She was last seen due to dyspnea on exertion.  She also complains of over 40 pounds weight gain.  Lasix was increased to 40 mg twice daily help with her symptoms.  Lexiscan was ordered to evaluate for ischemia due to patient's history.  Pacemaker had inverted atrial and ventricular leads status post revision on 6/21.  Patient still complains of weight gain, torsemide was increased to 60 mg daily without any improvement. She has occasional palpitations. Was started on amiodarone by electrophysiology. She has taken Eliquis 5 mg twice daily roughly a week and a half now.  Historical notes  She sleeps on a hospital bed at home and has required to place it on an incline.  She also endorses lower extremity edema for which she uses Ace wraps. Patient has a history of bilateral chronic venous insufficiency in the deep vein system of the lower extremity, and lymphedema, being seen at the vein clinic.  Has compression stockings and lymphedema pump device prescribed by specialist.  She states gaining about 40 pounds over the past 4 months.  She takes Lasix 40 mg daily and goes to the bathroom frequently but does not feel like she is getting out enough fluid.  She denies any symptoms of chest pain.  She states having an MI and prior stents totaling 5 before getting a  three-vessel CABG. she would like to establish care with the device clinic for frequent pacemaker checks.  Echocardiogram on 11/2018 showed low normal ejection fraction with EF 50 to 55%, pseudonormal diastolic function.  Stress Myoview in 10/07/2016 was normal.  Past Medical History:  Diagnosis Date  . Anxiety   . Arthritis   . Broken arm    left FA 3/17  . Broken arm    left  . CAD (coronary artery disease)    s/p MI  . Cancer ([place])    uterine  . Cervical disc disorder   . Depression   . Diabetes mellitus without complication ([place])   . GERD (gastroesophageal reflux disease)   . Headache   . Hyperlipidemia   . Hypertension   . Hypothyroid   . Myocardial infarction ([place])    15 year ago  . Neck pain 06/04/2014  . Parkinson's disease ([place])   . Presence of permanent cardiac pacemaker   . Spinal stenosis     Past Surgical History:  Procedure Laterality Date  . ABDOMINAL HYSTERECTOMY     partial  . BREAST SURGERY    . COLONOSCOPY    . COLONOSCOPY WITH PROPOFOL N/A 11/29/2017   Procedure: COLONOSCOPY WITH PROPOFOL;  Surgeon: [name], MD;  Location: [facility] ENDOSCOPY;  Service: Endoscopy;  Laterality: N/A;  . CORONARY ANGIOPLASTY     stent  . CORONARY ARTERY BYPASS GRAFT    . CORONARY STENT PLACEMENT    .  ESOPHAGOGASTRODUODENOSCOPY (EGD) WITH PROPOFOL N/A 04/18/2017   Procedure: ESOPHAGOGASTRODUODENOSCOPY (EGD) WITH PROPOFOL;  Surgeon: [name], MD;  Location: [facility] ENDOSCOPY;  Service: Endoscopy;  Laterality: N/A;  . ESOPHAGOGASTRODUODENOSCOPY (EGD) WITH PROPOFOL N/A 11/29/2017   Procedure: ESOPHAGOGASTRODUODENOSCOPY (EGD) WITH PROPOFOL;  Surgeon: [name], MD;  Location[facility] ENDOSCOPY;  Service: Endoscopy;  Laterality: N/A;  . INSERT / REPLACE / REMOVE PACEMAKER    . PPM GENERATOR CHANGEOUT N/A 09/05/2019   Procedure: PPM GENERATOR CHANGEOUT;  Surgeon: [name], MD;  Location: [place] CV LAB;  Service: Cardiovascular;  Laterality: N/A;  . s/p  pacer insertion    . TRANSANAL EXCISION OF RECTAL MASS N/A 01/17/2018   Procedure: TRANSANAL EXCISION OF RECTAL POLYP;  Surgeon: [name], MD;  Location: [facility] ORS;  Service: General;  Laterality: N/A;    Current Medications: Current Meds  Medication Sig  . amiodarone (PACERONE) 200 MG tablet Take 1 tablet (200 mg total) by mouth 2 (two) times daily.  [name] apixaban (ELIQUIS) 5 MG TABS tablet Take 1 tablet (5 mg total) by mouth 2 (two) times daily.  . cholecalciferol (VITAMIN D3) 25 MCG (1000 UNIT) tablet Take 1,000 Units by mouth daily.  . cholestyramine (QUESTRAN) 4 g packet Take 4 g by mouth daily.  [name] docusate sodium (COLACE) 100 MG capsule Take 100 mg by mouth daily.  [name] donepezil (ARICEPT) 10 MG tablet Take 10 mg by mouth daily.   [name] gabapentin (NEURONTIN) 300 MG capsule Take 300-600 mg by mouth See admin instructions. Take 300 mg in the morning and 600 mg at night  . hydrOXYzine (ATARAX/VISTARIL) 25 MG tablet Take 25 mg by mouth 2 (two) times daily.  [name] levothyroxine (SYNTHROID) 200 MCG tablet Take 200 mcg by mouth daily.   [name] LINZESS 72 MCG capsule Take 72 mcg by mouth every morning.  . loratadine (CLARITIN) 10 MG tablet Take 10 mg by mouth daily.   . meclizine (ANTIVERT) 12.5 MG tablet Take 1 tablet (12.5 mg total) by mouth 2 (two) times daily as needed for dizziness.  . montelukast (SINGULAIR) 10 MG tablet Take 10 mg by mouth at bedtime.   . Multiple Vitamin (MULTIVITAMIN WITH MINERALS) TABS tablet Take 1 tablet by mouth daily.  . nitroGLYCERIN (NITROSTAT) 0.4 MG SL tablet Place 0.4 mg under the tongue every 5 (five) minutes as needed for chest pain.   [name] OLANZapine (ZYPREXA) 2.5 MG tablet Take 2.5 mg by mouth in the morning and at bedtime.   . Omega-3 Fatty Acids (FISH OIL) 1000 MG CAPS Take 1,000 mg by mouth daily.  [name] omeprazole (PRILOSEC) 40 MG capsule Take 40 mg by mouth 2 (two) times daily.  . pioglitazone (ACTOS) 30 MG tablet Take 30 mg by mouth daily.   . potassium chloride  SA (K-DUR,KLOR-CON) 20 MEQ tablet Take 20 mEq by mouth 2 (two) times daily.   . rosuvastatin (CRESTOR) 40 MG tablet Take 40 mg by mouth daily.  . sertraline (ZOLOFT) 100 MG tablet Take 100 mg by mouth 2 (two) times daily.  [name] torsemide (DEMADEX) 20 MG tablet Take 2 tablets (40 mg total) by mouth 2 (two) times daily.  . traMADol (ULTRAM) 50 MG tablet Take 50 mg by mouth 3 (three) times daily as needed for moderate pain.  . vitamin B-12 (CYANOCOBALAMIN) 1000 MCG tablet Take 1,000 mcg by mouth daily.  . vitamin E 180 MG (400 UNITS) capsule Take 400 Units by mouth daily.  . [DISCONTINUED] torsemide (DEMADEX) 20 MG tablet Take 3  tablets (60 mg total) by mouth daily.     Allergies:   Penicillins   Social History   Socioeconomic History  . Marital status: Widowed    Spouse name: Not on file  . Number of children: Not on file  . Years of education: Not on file  . Highest education level: Not on file  Occupational History  . Occupation: retired  Tobacco Use  . Smoking status: Never Smoker  . Smokeless tobacco: Never Used  Vaping Use  . Vaping Use: Never used  Substance and Sexual Activity  . Alcohol use: No  . Drug use: No  . Sexual activity: Never  Other Topics Concern  . Not on file  Social History Narrative  . Not on file   Social Determinants of Health   Financial Resource Strain:   . Difficulty of Paying Living Expenses:   Food Insecurity:   . Worried About Charity fundraiser in the Last Year:   . Arboriculturist in the Last Year:   Transportation Needs:   . Film/video editor (Medical):   [name] Lack of Transportation (Non-Medical):   Physical Activity:   . Days of Exercise per Week:   . Minutes of Exercise per Session:   Stress:   . Feeling of Stress :   Social Connections:   . Frequency of Communication with Friends and Family:   . Frequency of Social Gatherings with Friends and Family:   . Attends Religious Services:   . Active Member of Clubs or Organizations:     . Attends Archivist Meetings:   [name] Marital Status:      Family History: The patient's family history includes Aneurysm in her mother; Cancer in her mother; Depression in her mother; Diabetes in her brother, brother, mother, and sister; Heart disease in her father; Hypertension in her mother.  ROS:   Please see the history of present illness.     All other systems reviewed and are negative.  EKGs/Labs/Other Studies Reviewed:    The following studies were reviewed today:   EKG:  EKG is  ordered today.  The ekg ordered today demonstrates atrial flutter, heart rate 94  Recent Labs: 11/28/2018: ALT 18 09/25/2019: TSH 5.933 09/29/2019: B Natriuretic Peptide 359.9 10/08/2019: BUN 41; Creatinine, Ser 1.39; Hemoglobin 10.5; Platelets 225; Potassium 3.2; Sodium 135  Recent Lipid Panel    Component Value Date/Time   CHOL 173 11/17/2016 0449   TRIG 94 11/17/2016 0449   HDL 69 11/17/2016 0449   CHOLHDL 2.5 11/17/2016 0449   VLDL 19 11/17/2016 0449   LDLCALC 85 11/17/2016 0449    Physical Exam:    VS:  BP 110/70 (BP Location: Right Arm, Patient Position: Sitting, Cuff Size: Normal)   Pulse 94   Ht 5\' 6"  (1.676 m)   Wt 196 lb (88.9 kg)   BMI 31.64 kg/m     Wt Readings from Last 3 Encounters:  10/10/19 196 lb (88.9 kg)  10/08/19 193 lb (87.5 kg)  10/03/19 190 lb (86.2 kg)     GEN:  Well nourished, well developed in no acute distress HEENT: Normal NECK: No JVD; No carotid bruits LYMPHATICS: No lymphadenopathy CARDIAC: RRR, no murmurs, rubs, gallops RESPIRATORY:  Clear to auscultation without rales, wheezing or rhonchi  ABDOMEN: Soft, non-tender, distended MUSCULOSKELETAL: 1+ lower extremity edema SKIN: Warm and dry NEUROLOGIC:  Alert and oriented x 3 PSYCHIATRIC:  Normal affect   ASSESSMENT:    1. Dyspnea on exertion  2. Hx of CABG   3. Pacemaker   4. PAF (paroxysmal atrial fibrillation) ([place])   5. Acute on chronic diastolic heart failure (HCC)    PLAN:     In order of problems listed above:  1. Patient with worsening dyspnea on exertion.  She has lower extremity edema and also weight gain.  Echo showed normal systolic function, grade 2 diastolic dysfunction.  EF 50 to 55%.  Lexiscan Myoview did not show any evidence for ischemia. Still has edema on my exam. Increase torsemide to 40 mg twice daily. 2. History of CAD/CABG.  Continue aspirin 81 mg, Crestor 40 mg. 3. History of permanent pacemaker. Keep appointment with device clinic for checks. 4. History of A. fib, EKG today shows atrial flutter. Continue amiodarone 200 mg twice daily and Eliquis. Plan for cardioversion after minimal 3 weeks of anticoagulation. Keep follow-up appointment with electrophysiology.  Follow up in 10 days for edema  Total encounter 42 minutes  Greater than 50% was spent in counseling and coordination of care with the patient   This note was generated in part or whole with voice recognition software. Voice recognition is usually quite accurate but there are transcription errors that can and very often do occur. I apologize for any typographical errors that were not detected and corrected.  Medication Adjustments/Labs and Tests Ordered: Current medicines are reviewed at length with the patient today.  Concerns regarding medicines are outlined above.  Orders Placed This Encounter  Procedures  . EKG 12-Lead   Meds ordered this encounter  Medications  . torsemide (DEMADEX) 20 MG tablet    Sig: Take 2 tablets (40 mg total) by mouth 2 (two) times daily.    Dispense:  180 tablet    Refill:  3    Patient Instructions  Medication Instructions:  1- INCREASE Torsemide Take 2 tablets (40 mg total) by mouth 2 (two) times daily. *If you need a refill on your cardiac medications before your next appointment, please call your pharmacy*   Lab Work: None ordered  If you have labs (blood work) drawn today and your tests are completely normal, you will receive your  results only by: [name] MyChart Message (if you have MyChart) OR . A paper copy in the mail If you have any lab test that is abnormal or we need to change your treatment, we will call you to review the results.   Testing/Procedures: None ordered   Follow-Up: At [facility], you and your health needs are our priority.  As part of our continuing mission to provide you with exceptional heart care, we have created designated Provider Care Teams.  These Care Teams include your primary Cardiologist (physician) and Advanced Practice Providers (APPs -  Physician Assistants and Nurse Practitioners) who all work together to provide you with the care you need, when you need it.  We recommend signing up for the patient portal called "MyChart".  Sign up information is provided on this After Visit Summary.  MyChart is used to connect with patients for Virtual Visits (Telemedicine).  Patients are able to view lab/test results, encounter notes, upcoming appointments, etc.  Non-urgent messages can be sent to your provider as well.   To learn more about what you can do with MyChart, go to [email or web address].    Your next appointment:   10 day(s)  The format for your next appointment:   In Person  Provider:    You may see Kate Sable, MD or one of the following Advanced Practice  Providers on your designated Care Team:    Murray Hodgkins, NP  Christell Faith, PA-C  Marrianne Mood, PA-C       Signed, Kate Sable, MD  10/10/2019 1:36 PM    [place]

## 2019-10-11 ENCOUNTER — Telehealth: Payer: Self-pay | Admitting: Cardiology

## 2019-10-11 NOTE — Telephone Encounter (Signed)
Patient calling  Would like to know if she should would need to wear the stockings everyday  Please call to clarify

## 2019-10-11 NOTE — Telephone Encounter (Signed)
Returned call to patient and advised she should wear them to comfort level and when swelling is up or if she will be standing for long periods.   Pt verbalized understanding and had no further questions at this time.   Advised pt to call for any further questions or concerns.

## 2019-10-15 ENCOUNTER — Other Ambulatory Visit: Payer: Self-pay | Admitting: Internal Medicine

## 2019-10-17 ENCOUNTER — Ambulatory Visit: Payer: Medicare Other | Admitting: Family

## 2019-10-21 ENCOUNTER — Telehealth: Payer: Self-pay | Admitting: Internal Medicine

## 2019-10-21 MED ORDER — AMIODARONE HCL 200 MG PO TABS: ORAL_TABLET | ORAL | Status: DC

## 2019-10-21 NOTE — Telephone Encounter (Signed)
I spoke with the patient.  She states "I was so sick this morning." She reports symptoms of left arm numbness, weakness, being sick on her stomach, dizzy, short winded, & pain the top of her left leg (not new).   She took a meclizine ~ 3:00 pm and is feeling some better. Confirmed with the patient that she is still taking amidarone 200 mg BID. The patient pulled her medication bottle and confirms she is taking this at 200 mg BID.   She was scheduled for a follow up on 8/5 with Laurann Montana, NP, but this has been r/s to 8/11 with comments that this was due to a transportation issue.  The patient states her CHF appt on 8/4 was the appt to be cancelled and not the appt on 8/5. I have sent a message to scheduling to confirm what happened.  In the interim the patient is aware I will review with Dr. Garen Lah as I am unsure if her symptoms are due to the high dose of amiodarone that she is still taking vs something neurologic in nature.  The patient is aware I will call her back once I receive MD recommendations and is agreeable.

## 2019-10-21 NOTE — Telephone Encounter (Signed)
I spoke with the patient. I advised her of Dr. Thereasa Solo recommendations to: - Decrease amiodarone to 200 mg once daily - schedule a follow up in office  I have r/s the patient's appointment to 10/25/19 at 3:20 pm with Dr. Garen Lah. The patient will call her transportation to confirm if this will work.   I have kept her appt with Laurann Montana, NP on 8/11 in case she cannot obtain transportation for 8/6.  The patient voices understanding of Dr. Thereasa Solo recommendations and is agreeable.

## 2019-10-21 NOTE — Telephone Encounter (Signed)
-----   Message from Kate Sable, MD sent at 10/21/2019 4:12 PM EDT -----  Decrease amiodarone to 200 mg daily. Continue Eliquis as prescribed. Schedule follow-up office visit so cardioversion can be scheduled in the hospital with myself in about 2-3 weeks. Thank you

## 2019-10-21 NOTE — Telephone Encounter (Signed)
Patient called to let us know she is blurry eyed, skaking, and not feeling well. States she "feels terrible", cannot hold onto anything, keeps dropping things. Denies any chest pain. States she is having some SOB, and a headache.

## 2019-10-23 ENCOUNTER — Emergency Department: Payer: Medicare Other

## 2019-10-23 ENCOUNTER — Ambulatory Visit: Payer: Medicare Other | Admitting: Family

## 2019-10-23 ENCOUNTER — Inpatient Hospital Stay
Admission: EM | Admit: 2019-10-23 | Discharge: 2019-10-28 | DRG: 194 | Disposition: A | Discharge status: Home / Self Care | Payer: Medicare Other | Attending: Internal Medicine | Admitting: Internal Medicine

## 2019-10-23 ENCOUNTER — Other Ambulatory Visit: Payer: Self-pay

## 2019-10-23 DIAGNOSIS — I252 Old myocardial infarction: Secondary | ICD-10-CM

## 2019-10-23 DIAGNOSIS — R2681 Unsteadiness on feet: Secondary | ICD-10-CM | POA: Diagnosis present

## 2019-10-23 DIAGNOSIS — I9589 Other hypotension: Secondary | ICD-10-CM | POA: Diagnosis not present

## 2019-10-23 DIAGNOSIS — E785 Hyperlipidemia, unspecified: Secondary | ICD-10-CM | POA: Diagnosis present

## 2019-10-23 DIAGNOSIS — W19XXXA Unspecified fall, initial encounter: Secondary | ICD-10-CM | POA: Diagnosis present

## 2019-10-23 DIAGNOSIS — M1990 Unspecified osteoarthritis, unspecified site: Secondary | ICD-10-CM | POA: Diagnosis present

## 2019-10-23 DIAGNOSIS — Z8542 Personal history of malignant neoplasm of other parts of uterus: Secondary | ICD-10-CM | POA: Diagnosis not present

## 2019-10-23 DIAGNOSIS — I4892 Unspecified atrial flutter: Secondary | ICD-10-CM | POA: Insufficient documentation

## 2019-10-23 DIAGNOSIS — Y95 Nosocomial condition: Secondary | ICD-10-CM | POA: Diagnosis present

## 2019-10-23 DIAGNOSIS — E1169 Type 2 diabetes mellitus with other specified complication: Secondary | ICD-10-CM | POA: Diagnosis present

## 2019-10-23 DIAGNOSIS — Z950 Presence of cardiac pacemaker: Secondary | ICD-10-CM

## 2019-10-23 DIAGNOSIS — F329 Major depressive disorder, single episode, unspecified: Secondary | ICD-10-CM | POA: Diagnosis present

## 2019-10-23 DIAGNOSIS — E11649 Type 2 diabetes mellitus with hypoglycemia without coma: Secondary | ICD-10-CM

## 2019-10-23 DIAGNOSIS — I2510 Atherosclerotic heart disease of native coronary artery without angina pectoris: Secondary | ICD-10-CM | POA: Diagnosis present

## 2019-10-23 DIAGNOSIS — I110 Hypertensive heart disease with heart failure: Secondary | ICD-10-CM | POA: Diagnosis present

## 2019-10-23 DIAGNOSIS — R739 Hyperglycemia, unspecified: Secondary | ICD-10-CM

## 2019-10-23 DIAGNOSIS — F419 Anxiety disorder, unspecified: Secondary | ICD-10-CM | POA: Diagnosis present

## 2019-10-23 DIAGNOSIS — Z951 Presence of aortocoronary bypass graft: Secondary | ICD-10-CM | POA: Diagnosis not present

## 2019-10-23 DIAGNOSIS — E1140 Type 2 diabetes mellitus with diabetic neuropathy, unspecified: Secondary | ICD-10-CM | POA: Diagnosis present

## 2019-10-23 DIAGNOSIS — G4700 Insomnia, unspecified: Secondary | ICD-10-CM | POA: Diagnosis present

## 2019-10-23 DIAGNOSIS — I959 Hypotension, unspecified: Secondary | ICD-10-CM

## 2019-10-23 DIAGNOSIS — G20 Parkinson's disease: Secondary | ICD-10-CM | POA: Diagnosis present

## 2019-10-23 DIAGNOSIS — F0280 Dementia in other diseases classified elsewhere without behavioral disturbance: Secondary | ICD-10-CM | POA: Diagnosis present

## 2019-10-23 DIAGNOSIS — R410 Disorientation, unspecified: Secondary | ICD-10-CM

## 2019-10-23 DIAGNOSIS — E1165 Type 2 diabetes mellitus with hyperglycemia: Secondary | ICD-10-CM | POA: Diagnosis present

## 2019-10-23 DIAGNOSIS — Z20822 Contact with and (suspected) exposure to covid-19: Secondary | ICD-10-CM | POA: Diagnosis present

## 2019-10-23 DIAGNOSIS — Z79891 Long term (current) use of opiate analgesic: Secondary | ICD-10-CM

## 2019-10-23 DIAGNOSIS — I5032 Chronic diastolic (congestive) heart failure: Secondary | ICD-10-CM | POA: Diagnosis present

## 2019-10-23 DIAGNOSIS — E876 Hypokalemia: Secondary | ICD-10-CM | POA: Diagnosis present

## 2019-10-23 DIAGNOSIS — J181 Lobar pneumonia, unspecified organism: Secondary | ICD-10-CM | POA: Diagnosis present

## 2019-10-23 DIAGNOSIS — I083 Combined rheumatic disorders of mitral, aortic and tricuspid valves: Secondary | ICD-10-CM | POA: Diagnosis present

## 2019-10-23 DIAGNOSIS — M5090 Cervical disc disorder, unspecified, unspecified cervical region: Secondary | ICD-10-CM | POA: Diagnosis present

## 2019-10-23 DIAGNOSIS — Z955 Presence of coronary angioplasty implant and graft: Secondary | ICD-10-CM

## 2019-10-23 DIAGNOSIS — Z7901 Long term (current) use of anticoagulants: Secondary | ICD-10-CM

## 2019-10-23 DIAGNOSIS — I272 Pulmonary hypertension, unspecified: Secondary | ICD-10-CM | POA: Diagnosis present

## 2019-10-23 DIAGNOSIS — R197 Diarrhea, unspecified: Secondary | ICD-10-CM | POA: Diagnosis not present

## 2019-10-23 DIAGNOSIS — K625 Hemorrhage of anus and rectum: Secondary | ICD-10-CM

## 2019-10-23 DIAGNOSIS — K921 Melena: Secondary | ICD-10-CM | POA: Diagnosis present

## 2019-10-23 DIAGNOSIS — K219 Gastro-esophageal reflux disease without esophagitis: Secondary | ICD-10-CM | POA: Diagnosis present

## 2019-10-23 DIAGNOSIS — E860 Dehydration: Secondary | ICD-10-CM | POA: Diagnosis present

## 2019-10-23 DIAGNOSIS — R42 Dizziness and giddiness: Secondary | ICD-10-CM | POA: Diagnosis not present

## 2019-10-23 DIAGNOSIS — Y92009 Unspecified place in unspecified non-institutional (private) residence as the place of occurrence of the external cause: Secondary | ICD-10-CM

## 2019-10-23 DIAGNOSIS — Z8249 Family history of ischemic heart disease and other diseases of the circulatory system: Secondary | ICD-10-CM

## 2019-10-23 DIAGNOSIS — I4819 Other persistent atrial fibrillation: Secondary | ICD-10-CM | POA: Diagnosis present

## 2019-10-23 DIAGNOSIS — D649 Anemia, unspecified: Secondary | ICD-10-CM | POA: Diagnosis present

## 2019-10-23 DIAGNOSIS — Z794 Long term (current) use of insulin: Secondary | ICD-10-CM

## 2019-10-23 DIAGNOSIS — Z809 Family history of malignant neoplasm, unspecified: Secondary | ICD-10-CM

## 2019-10-23 DIAGNOSIS — E871 Hypo-osmolality and hyponatremia: Secondary | ICD-10-CM | POA: Diagnosis present

## 2019-10-23 DIAGNOSIS — R4182 Altered mental status, unspecified: Secondary | ICD-10-CM | POA: Diagnosis present

## 2019-10-23 DIAGNOSIS — I495 Sick sinus syndrome: Secondary | ICD-10-CM | POA: Diagnosis present

## 2019-10-23 DIAGNOSIS — A419 Sepsis, unspecified organism: Secondary | ICD-10-CM

## 2019-10-23 DIAGNOSIS — I4891 Unspecified atrial fibrillation: Secondary | ICD-10-CM | POA: Diagnosis not present

## 2019-10-23 DIAGNOSIS — Z79899 Other long term (current) drug therapy: Secondary | ICD-10-CM | POA: Diagnosis not present

## 2019-10-23 DIAGNOSIS — Z818 Family history of other mental and behavioral disorders: Secondary | ICD-10-CM

## 2019-10-23 DIAGNOSIS — R0989 Other specified symptoms and signs involving the circulatory and respiratory systems: Secondary | ICD-10-CM | POA: Diagnosis present

## 2019-10-23 DIAGNOSIS — E039 Hypothyroidism, unspecified: Secondary | ICD-10-CM | POA: Diagnosis present

## 2019-10-23 DIAGNOSIS — J189 Pneumonia, unspecified organism: Secondary | ICD-10-CM

## 2019-10-23 DIAGNOSIS — R079 Chest pain, unspecified: Secondary | ICD-10-CM | POA: Diagnosis not present

## 2019-10-23 DIAGNOSIS — Z880 Allergy status to penicillin: Secondary | ICD-10-CM

## 2019-10-23 DIAGNOSIS — Z833 Family history of diabetes mellitus: Secondary | ICD-10-CM

## 2019-10-23 DIAGNOSIS — K649 Unspecified hemorrhoids: Secondary | ICD-10-CM | POA: Diagnosis present

## 2019-10-23 DIAGNOSIS — Z90710 Acquired absence of both cervix and uterus: Secondary | ICD-10-CM

## 2019-10-23 LAB — CBC WITH DIFFERENTIAL/PLATELET
Abs Immature Granulocytes: 0.06 10*3/uL (ref 0.00–0.07)
Basophils Absolute: 0 10*3/uL (ref 0.0–0.1)
Basophils Relative: 0 %
Eosinophils Absolute: 0 10*3/uL (ref 0.0–0.5)
Eosinophils Relative: 0 %
HCT: 31.4 % — ABNORMAL LOW (ref 36.0–46.0)
Hemoglobin: 10.4 g/dL — ABNORMAL LOW (ref 12.0–15.0)
Immature Granulocytes: 1 %
Lymphocytes Relative: 10 %
Lymphs Abs: 1 10*3/uL (ref 0.7–4.0)
MCH: 30.3 pg (ref 26.0–34.0)
MCHC: 33.1 g/dL (ref 30.0–36.0)
MCV: 91.5 fL (ref 80.0–100.0)
Monocytes Absolute: 0.7 10*3/uL (ref 0.1–1.0)
Monocytes Relative: 7 %
Neutro Abs: 8.2 10*3/uL — ABNORMAL HIGH (ref 1.7–7.7)
Neutrophils Relative %: 82 %
Platelets: 122 10*3/uL — ABNORMAL LOW (ref 150–400)
RBC: 3.43 MIL/uL — ABNORMAL LOW (ref 3.87–5.11)
RDW: 14.6 % (ref 11.5–15.5)
WBC: 9.9 10*3/uL (ref 4.0–10.5)
nRBC: 0 % (ref 0.0–0.2)

## 2019-10-23 LAB — COMPREHENSIVE METABOLIC PANEL
ALT: 50 U/L — ABNORMAL HIGH (ref 0–44)
AST: 52 U/L — ABNORMAL HIGH (ref 15–41)
Albumin: 3.7 g/dL (ref 3.5–5.0)
Alkaline Phosphatase: 118 U/L (ref 38–126)
Anion gap: 15 (ref 5–15)
BUN: 22 mg/dL (ref 8–23)
CO2: 32 mmol/L (ref 22–32)
Calcium: 8.3 mg/dL — ABNORMAL LOW (ref 8.9–10.3)
Chloride: 81 mmol/L — ABNORMAL LOW (ref 98–111)
Creatinine, Ser: 1.11 mg/dL — ABNORMAL HIGH (ref 0.44–1.00)
GFR calc Af Amer: 54 mL/min — ABNORMAL LOW (ref >60)
GFR calc non Af Amer: 47 mL/min — ABNORMAL LOW (ref >60)
Glucose, Bld: 594 mg/dL (ref 70–99)
Potassium: 2.3 mmol/L — CL (ref 3.5–5.1)
Sodium: 128 mmol/L — ABNORMAL LOW (ref 135–145)
Total Bilirubin: 0.8 mg/dL (ref 0.3–1.2)
Total Protein: 7.3 g/dL (ref 6.5–8.1)

## 2019-10-23 LAB — GLUCOSE, CAPILLARY
Glucose-Capillary: 526 mg/dL (ref 70–99)
Glucose-Capillary: 301 mg/dL — ABNORMAL HIGH (ref 70–99)
Glucose-Capillary: 348 mg/dL — ABNORMAL HIGH (ref 70–99)
Glucose-Capillary: 478 mg/dL — ABNORMAL HIGH (ref 70–99)

## 2019-10-23 LAB — POTASSIUM
Potassium: 2.2 mmol/L — CL (ref 3.5–5.1)
Potassium: 3.4 mmol/L — ABNORMAL LOW (ref 3.5–5.1)

## 2019-10-23 LAB — MAGNESIUM: Magnesium: 1.9 mg/dL (ref 1.7–2.4)

## 2019-10-23 LAB — LACTIC ACID, PLASMA: Lactic Acid, Venous: 1.4 mmol/L (ref 0.5–1.9)

## 2019-10-23 LAB — HIV ANTIBODY (ROUTINE TESTING W REFLEX): HIV Screen 4th Generation wRfx: NONREACTIVE

## 2019-10-23 LAB — SARS CORONAVIRUS 2 BY RT PCR (HOSPITAL ORDER, PERFORMED IN ~~LOC~~ HOSPITAL LAB): SARS Coronavirus 2: NEGATIVE

## 2019-10-23 LAB — PROCALCITONIN: Procalcitonin: 1.33 ng/mL

## 2019-10-23 MED ORDER — ONDANSETRON HCL 4 MG/2ML IJ SOLN
4.0000 mg | Freq: Once | INTRAMUSCULAR | Status: AC
  Administered 2019-10-23: 4 mg via INTRAVENOUS
  Filled 2019-10-23: qty 2

## 2019-10-23 MED ORDER — TRAMADOL HCL 50 MG PO TABS
50.0000 mg | ORAL_TABLET | Freq: Three times a day (TID) | ORAL | Status: DC | PRN
  Administered 2019-10-24 – 2019-10-26 (×3): 50 mg via ORAL
  Filled 2019-10-23 (×3): qty 1

## 2019-10-23 MED ORDER — GABAPENTIN 300 MG PO CAPS
600.0000 mg | ORAL_CAPSULE | Freq: Every day | ORAL | Status: DC
  Administered 2019-10-23 – 2019-10-25 (×3): 600 mg via ORAL
  Filled 2019-10-23 (×3): qty 2

## 2019-10-23 MED ORDER — PIOGLITAZONE HCL 30 MG PO TABS: 30.0000 mg | ORAL_TABLET | Freq: Every day | ORAL | Status: DC

## 2019-10-23 MED ORDER — TRAMADOL HCL 50 MG PO TABS
50.0000 mg | ORAL_TABLET | ORAL | Status: AC
  Administered 2019-10-23: 50 mg via ORAL
  Filled 2019-10-23: qty 1

## 2019-10-23 MED ORDER — VITAMIN B-12 1000 MCG PO TABS
1000.0000 ug | ORAL_TABLET | Freq: Every day | ORAL | Status: DC
  Administered 2019-10-25 – 2019-10-28 (×4): 1000 ug via ORAL
  Filled 2019-10-23 (×4): qty 1

## 2019-10-23 MED ORDER — HYDROXYZINE HCL 25 MG PO TABS
25.0000 mg | ORAL_TABLET | Freq: Two times a day (BID) | ORAL | Status: DC
  Administered 2019-10-23 – 2019-10-26 (×5): 25 mg via ORAL
  Filled 2019-10-23 (×5): qty 1

## 2019-10-23 MED ORDER — ROSUVASTATIN CALCIUM 10 MG PO TABS
40.0000 mg | ORAL_TABLET | Freq: Every day | ORAL | Status: DC
  Administered 2019-10-25 – 2019-10-28 (×4): 40 mg via ORAL
  Filled 2019-10-23 (×3): qty 4
  Filled 2019-10-23: qty 2
  Filled 2019-10-23: qty 4

## 2019-10-23 MED ORDER — SODIUM CHLORIDE 0.9 % IV SOLN
500.0000 mg | Freq: Every day | INTRAVENOUS | Status: DC
  Administered 2019-10-23 – 2019-10-25 (×3): 500 mg via INTRAVENOUS
  Filled 2019-10-23 (×4): qty 500

## 2019-10-23 MED ORDER — MECLIZINE HCL 12.5 MG PO TABS
12.5000 mg | ORAL_TABLET | Freq: Two times a day (BID) | ORAL | Status: DC | PRN
  Administered 2019-10-28: 12.5 mg via ORAL
  Filled 2019-10-23 (×2): qty 1

## 2019-10-23 MED ORDER — INSULIN ASPART 100 UNIT/ML ~~LOC~~ SOLN
5.0000 [IU] | Freq: Once | SUBCUTANEOUS | Status: AC
  Administered 2019-10-23: 5 [IU] via INTRAVENOUS
  Filled 2019-10-23: qty 1

## 2019-10-23 MED ORDER — SODIUM CHLORIDE 0.9 % IV SOLN
2.0000 g | Freq: Once | INTRAVENOUS | Status: DC
  Filled 2019-10-23: qty 2

## 2019-10-23 MED ORDER — DOCUSATE SODIUM 100 MG PO CAPS
100.0000 mg | ORAL_CAPSULE | Freq: Every day | ORAL | Status: DC | PRN
  Administered 2019-10-23: 100 mg via ORAL
  Filled 2019-10-23: qty 1

## 2019-10-23 MED ORDER — MONTELUKAST SODIUM 10 MG PO TABS
10.0000 mg | ORAL_TABLET | Freq: Every day | ORAL | Status: DC
  Administered 2019-10-23 – 2019-10-27 (×5): 10 mg via ORAL
  Filled 2019-10-23 (×5): qty 1

## 2019-10-23 MED ORDER — LINACLOTIDE 72 MCG PO CAPS
72.0000 ug | ORAL_CAPSULE | Freq: Every morning | ORAL | Status: DC
  Filled 2019-10-23 (×2): qty 1

## 2019-10-23 MED ORDER — IOHEXOL 350 MG/ML SOLN: 100.0000 mL | Freq: Once | INTRAVENOUS | Status: DC | PRN

## 2019-10-23 MED ORDER — INSULIN ASPART 100 UNIT/ML ~~LOC~~ SOLN
0.0000 [IU] | SUBCUTANEOUS | Status: DC
  Administered 2019-10-23 – 2019-10-24 (×2): 9 [IU] via SUBCUTANEOUS
  Administered 2019-10-24: 5 [IU] via SUBCUTANEOUS
  Administered 2019-10-24: 9 [IU] via SUBCUTANEOUS
  Administered 2019-10-25: 2 [IU] via SUBCUTANEOUS
  Administered 2019-10-25: 5 [IU] via SUBCUTANEOUS
  Administered 2019-10-25: 3 [IU] via SUBCUTANEOUS
  Administered 2019-10-25: 5 [IU] via SUBCUTANEOUS
  Administered 2019-10-25 – 2019-10-26 (×2): 2 [IU] via SUBCUTANEOUS
  Administered 2019-10-26: 5 [IU] via SUBCUTANEOUS
  Administered 2019-10-26: 3 [IU] via SUBCUTANEOUS
  Administered 2019-10-26: 2 [IU] via SUBCUTANEOUS
  Administered 2019-10-26: 3 [IU] via SUBCUTANEOUS
  Administered 2019-10-27: 9 [IU] via SUBCUTANEOUS
  Administered 2019-10-27: 3 [IU] via SUBCUTANEOUS
  Administered 2019-10-27: 5 [IU] via SUBCUTANEOUS
  Administered 2019-10-27: 9 [IU] via SUBCUTANEOUS
  Administered 2019-10-27: 7 [IU] via SUBCUTANEOUS
  Administered 2019-10-28: 2 [IU] via SUBCUTANEOUS
  Administered 2019-10-28: 7 [IU] via SUBCUTANEOUS
  Administered 2019-10-28 (×2): 3 [IU] via SUBCUTANEOUS
  Filled 2019-10-23 (×23): qty 1

## 2019-10-23 MED ORDER — DONEPEZIL HCL 5 MG PO TABS
10.0000 mg | ORAL_TABLET | Freq: Every day | ORAL | Status: DC
  Administered 2019-10-24 – 2019-10-28 (×5): 10 mg via ORAL
  Filled 2019-10-23 (×6): qty 2

## 2019-10-23 MED ORDER — VITAMIN E 180 MG (400 UNIT) PO CAPS
400.0000 [IU] | ORAL_CAPSULE | Freq: Every day | ORAL | Status: DC
  Administered 2019-10-25 – 2019-10-28 (×4): 400 [IU] via ORAL
  Filled 2019-10-23 (×5): qty 1

## 2019-10-23 MED ORDER — SERTRALINE HCL 50 MG PO TABS
100.0000 mg | ORAL_TABLET | Freq: Two times a day (BID) | ORAL | Status: DC
  Administered 2019-10-23 – 2019-10-28 (×10): 100 mg via ORAL
  Filled 2019-10-23 (×10): qty 2

## 2019-10-23 MED ORDER — POTASSIUM CHLORIDE CRYS ER 20 MEQ PO TBCR: 40.0000 meq | EXTENDED_RELEASE_TABLET | Freq: Once | ORAL | Status: DC

## 2019-10-23 MED ORDER — SODIUM CHLORIDE 0.9 % IV SOLN: INTRAVENOUS | Status: DC

## 2019-10-23 MED ORDER — OLANZAPINE 2.5 MG PO TABS
2.5000 mg | ORAL_TABLET | Freq: Every day | ORAL | Status: DC
  Administered 2019-10-24 – 2019-10-28 (×5): 2.5 mg via ORAL
  Filled 2019-10-23 (×5): qty 1

## 2019-10-23 MED ORDER — CHOLESTYRAMINE 4 G PO PACK
4.0000 g | PACK | Freq: Every day | ORAL | Status: DC
  Administered 2019-10-25 – 2019-10-28 (×4): 4 g via ORAL
  Filled 2019-10-23 (×6): qty 1

## 2019-10-23 MED ORDER — LEVOTHYROXINE SODIUM 100 MCG PO TABS
200.0000 ug | ORAL_TABLET | Freq: Every day | ORAL | Status: DC
  Administered 2019-10-24 – 2019-10-28 (×5): 200 ug via ORAL
  Filled 2019-10-23 (×3): qty 2
  Filled 2019-10-23: qty 4
  Filled 2019-10-23: qty 2

## 2019-10-23 MED ORDER — GABAPENTIN 300 MG PO CAPS
300.0000 mg | ORAL_CAPSULE | Freq: Every morning | ORAL | Status: DC
  Administered 2019-10-24 – 2019-10-28 (×5): 300 mg via ORAL
  Filled 2019-10-23 (×5): qty 1

## 2019-10-23 MED ORDER — APIXABAN 5 MG PO TABS
5.0000 mg | ORAL_TABLET | Freq: Two times a day (BID) | ORAL | Status: DC
  Administered 2019-10-23 – 2019-10-28 (×10): 5 mg via ORAL
  Filled 2019-10-23 (×10): qty 1

## 2019-10-23 MED ORDER — ADULT MULTIVITAMIN W/MINERALS CH
1.0000 | ORAL_TABLET | Freq: Every day | ORAL | Status: DC
  Administered 2019-10-23 – 2019-10-27 (×5): 1 via ORAL
  Filled 2019-10-23 (×5): qty 1

## 2019-10-23 MED ORDER — LORATADINE 10 MG PO TABS
10.0000 mg | ORAL_TABLET | Freq: Every day | ORAL | Status: DC
  Administered 2019-10-25 – 2019-10-28 (×4): 10 mg via ORAL
  Filled 2019-10-23 (×4): qty 1

## 2019-10-23 MED ORDER — IOHEXOL 300 MG/ML  SOLN
100.0000 mL | Freq: Once | INTRAMUSCULAR | Status: AC | PRN
  Administered 2019-10-23: 100 mL via INTRAVENOUS

## 2019-10-23 MED ORDER — NITROGLYCERIN 0.4 MG SL SUBL
0.4000 mg | SUBLINGUAL_TABLET | SUBLINGUAL | Status: DC | PRN
  Filled 2019-10-23: qty 1

## 2019-10-23 MED ORDER — VANCOMYCIN HCL IN DEXTROSE 1-5 GM/200ML-% IV SOLN: 1000.0000 mg | Freq: Once | INTRAVENOUS | Status: DC

## 2019-10-23 MED ORDER — POTASSIUM CHLORIDE CRYS ER 20 MEQ PO TBCR: 20.0000 meq | EXTENDED_RELEASE_TABLET | Freq: Two times a day (BID) | ORAL | Status: DC

## 2019-10-23 MED ORDER — SODIUM CHLORIDE 0.9 % IV SOLN
2.0000 g | Freq: Every day | INTRAVENOUS | Status: AC
  Administered 2019-10-23 – 2019-10-27 (×5): 2 g via INTRAVENOUS
  Filled 2019-10-23 (×2): qty 2
  Filled 2019-10-23: qty 20
  Filled 2019-10-23 (×2): qty 2

## 2019-10-23 MED ORDER — AMIODARONE HCL 200 MG PO TABS: 200.0000 mg | ORAL_TABLET | Freq: Every day | ORAL | Status: DC

## 2019-10-23 MED ORDER — POTASSIUM CHLORIDE CRYS ER 20 MEQ PO TBCR
40.0000 meq | EXTENDED_RELEASE_TABLET | ORAL | Status: AC
  Administered 2019-10-23 (×2): 40 meq via ORAL
  Filled 2019-10-23 (×2): qty 2

## 2019-10-23 MED ORDER — VITAMIN D 25 MCG (1000 UNIT) PO TABS
1000.0000 [IU] | ORAL_TABLET | Freq: Every day | ORAL | Status: DC
  Administered 2019-10-25 – 2019-10-28 (×4): 1000 [IU] via ORAL
  Filled 2019-10-23 (×4): qty 1

## 2019-10-23 MED ORDER — POTASSIUM CHLORIDE CRYS ER 20 MEQ PO TBCR
40.0000 meq | EXTENDED_RELEASE_TABLET | Freq: Once | ORAL | Status: AC
  Administered 2019-10-23: 40 meq via ORAL
  Filled 2019-10-23: qty 2

## 2019-10-23 MED ORDER — LEVOFLOXACIN IN D5W 750 MG/150ML IV SOLN: 750.0000 mg | INTRAVENOUS | Status: DC

## 2019-10-23 MED ORDER — PANTOPRAZOLE SODIUM 40 MG PO TBEC
40.0000 mg | DELAYED_RELEASE_TABLET | Freq: Every day | ORAL | Status: DC
  Administered 2019-10-24 – 2019-10-28 (×5): 40 mg via ORAL
  Filled 2019-10-23 (×5): qty 1

## 2019-10-23 MED ORDER — POTASSIUM CHLORIDE IN NACL 40-0.9 MEQ/L-% IV SOLN
INTRAVENOUS | Status: DC
  Filled 2019-10-23 (×3): qty 1000

## 2019-10-23 MED ORDER — SODIUM CHLORIDE 0.9 % IV BOLUS
500.0000 mL | Freq: Once | INTRAVENOUS | Status: AC
  Administered 2019-10-23: 500 mL via INTRAVENOUS
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
## 2019-10-23 NOTE — Progress Notes (Signed)
Inpatient Diabetes Program Recommendations  AACE/ADA: New Consensus Statement on Inpatient Glycemic Control   Target Ranges:  Prepandial:   less than 140 mg/dL      Peak postprandial:   less than 180 mg/dL (1-2 hours)      Critically ill patients:  140 - 180 mg/dL   Results for Kristi [name], Kristi [name] (MRN [id number]) as of 10/23/2019 13:49  Ref. Range 10/23/2019 11:09  Potassium Latest Ref Range: 3.5 - 5.1 mmol/L 2.3 (LL)  CO2 Latest Ref Range: 22 - 32 mmol/L 32  Glucose Latest Ref Range: 70 - 99 mg/dL 594 (HH)  Anion gap Latest Ref Range: 5 - 15  15   Review of Glycemic Control  Diabetes history: DM2 Outpatient Diabetes medications: Actos 30 mg daily Current orders for Inpatient glycemic control: None; in Emergency Room  Inpatient Diabetes Program Recommendations:   Insulin-Would recommend using IV insulin for hyperglycemia once potassium is replaced and up to at least 3.5.  Outpatient DM medications: Noted patient has a history of CHF and has Actos on home medication list for DM. Actos should not be used in patient with CHF due to side effect of fluid retention. Please consider discontinuing Actos and use different DM medication for DM management.  NOTE: In reviewing chart, noted patient in ED after fall at home and noted to have glucose of 594 mg/dl on labs and potassium 2.3 mmol/L. Patient has Actos listed as only DM medication on home medication list. Patient has history of CHF; therefore should not be taking Actos due to side effect of fluid retention. Noted patient was recently inpatient 09/25/19-09/30/19 and per discharge summary on 09/30/19 patient was instructed to stop Amaryl and continue Actos. Once potassium is replaced and up to at least 3.5 mmol/L, would recommend ordering IV insulin for hyperglycemia.   Thanks, Barnie Alderman, RN, MSN, CDE Diabetes Coordinator Inpatient Diabetes Program [phone] (Team Pager from 8am to 5pm)

## 2019-10-23 NOTE — Consult Note (Signed)
[place] for Electrolyte Monitoring and Replacement   Recent Labs: Potassium (mmol/L)  Date Value  10/23/2019 2.3 (LL)  02/19/2014 3.5   Magnesium (mg/dL)  Date Value  10/23/2019 1.9   Calcium (mg/dL)  Date Value  10/23/2019 8.3 (L)   Calcium, Total (mg/dL)  Date Value  02/19/2014 9.1   Albumin (g/dL)  Date Value  10/23/2019 3.7   Sodium (mmol/L)  Date Value  10/23/2019 128 (L)  08/13/2019 142  02/19/2014 143   Corrected calcium: 8.5 mg/dL  Assessment: Patient is an 79 y/o F with medical history including CAD, CHF, diabetes, atrial fibrillation, hx of CVA who presented to the ED after falling at home. Labs on admission notable for a potassium of 2.3 and BG of 594.   Goal of Therapy:  K ~4, Mg ~2, all other electrolytes within normal limits  Plan:  --K 2.3, will order PO potassium 40 mEq q2h x 2 --Re-check potassium at [place]  10/23/2019 2:45 PM

## 2019-10-23 NOTE — ED Notes (Signed)
Pt cleaned of stool and urine. Given new brief and new sheets.

## 2019-10-23 NOTE — ED Notes (Signed)
Pt desats down to low 80s then back up to 90s. Pt placed on 2L [place].

## 2019-10-23 NOTE — ED Triage Notes (Signed)
Patient arrived via EMS, patient fell at home and when attempted denies hitting head, patient is on blood thinners not sure what. Patient said she felt dizzy. Patient has a hx of CHF and has a pacemaker installed.

## 2019-10-23 NOTE — Consult Note (Signed)
PHARMACY -  BRIEF ANTIBIOTIC NOTE   Pharmacy has received consult(s) for vancomycin and aztreonam from an ED provider. Consult with authorization to change aztreonam to cefepime if documented history of use of cephalosporins. The patient's profile has been reviewed for ht/wt/allergies/indication/available labs.    Patient with history of PCN allergy (anaphylaxis, swelling, rash). However, patient has documented past receipt of ceftriaxone in chart.   Discussed with admitting doctor, will discontinue aztreonam and vancomycin and start ceftriaxone + azithromycin.                       Thank you, Benita Gutter 10/23/2019  2:19 PM

## 2019-10-23 NOTE — Progress Notes (Signed)
CODE SEPSIS - PHARMACY COMMUNICATION  **Broad Spectrum Antibiotics should be administered within 1 hour of Sepsis diagnosis**  Time Code Sepsis Called/Page Received: 1339  Antibiotics Ordered: Ceftriaxone + azithromycin  Time of 1st antibiotic administration: 1552  Additional action taken by pharmacy: Spoke with RN at [place] 10/23/2019  2:14 PM

## 2019-10-23 NOTE — ED Notes (Addendum)
Dr. Manuella Ghazi informed that patient BP has dropped to 80/55, and also that her blood sugar is 301

## 2019-10-23 NOTE — ED Notes (Signed)
Patient urinated in bed, writer helped clean patient, changed her depend and changed her bed linen and clothing Patient able to ambulate with assistance

## 2019-10-23 NOTE — Consult Note (Signed)
[place] for Electrolyte Monitoring and Replacement   Recent Labs: Potassium (mmol/L)  Date Value  10/23/2019 2.2 (LL)  02/19/2014 3.5   Magnesium (mg/dL)  Date Value  10/23/2019 1.9   Calcium (mg/dL)  Date Value  10/23/2019 8.3 (L)   Calcium, Total (mg/dL)  Date Value  02/19/2014 9.1   Albumin (g/dL)  Date Value  10/23/2019 3.7   Sodium (mmol/L)  Date Value  10/23/2019 128 (L)  08/13/2019 142  02/19/2014 143   Corrected calcium: 8.5 mg/dL  Assessment: Patient is an 79 y/o F with medical history including CAD, CHF, diabetes, atrial fibrillation, hx of CVA who presented to the ED after falling at home. Labs on admission notable for a potassium of 2.3 and BG of 594.   Goal of Therapy:  K ~4, Mg ~2, all other electrolytes within normal limits  Plan:  --8/4 at 1811 K 2.2 after PO KCl 40 mEq x 1. Second dose was administered after lab draw. Will order additional PO KCl 40 mEq x1 and add 40 mEq/L to current fluid order.  --Re-check potassium at 2200, BMP tomorrow morning  Benita Gutter  10/23/2019 7:50 PM

## 2019-10-23 NOTE — ED Notes (Signed)
Date and time results received: 10/23/19 11:54 AM  (use smartphrase ".now" to insert current time)  Test: CBG  Critical Value: 594  Name of Provider Notified: Dr. Jacqualine Code  Orders Received? Or Actions Taken?: no new orders at this time

## 2019-10-23 NOTE — Consult Note (Signed)
[place] for Electrolyte Monitoring and Replacement   Recent Labs: Potassium (mmol/L)  Date Value  10/23/2019 3.4 (L)  02/19/2014 3.5   Magnesium (mg/dL)  Date Value  10/23/2019 1.9   Calcium (mg/dL)  Date Value  10/23/2019 8.3 (L)   Calcium, Total (mg/dL)  Date Value  02/19/2014 9.1   Albumin (g/dL)  Date Value  10/23/2019 3.7   Sodium (mmol/L)  Date Value  10/23/2019 128 (L)  08/13/2019 142  02/19/2014 143   Corrected calcium: 8.5 mg/dL  Assessment: Patient is an 79 y/o F with medical history including CAD, CHF, diabetes, atrial fibrillation, hx of CVA who presented to the ED after falling at home. Labs on admission notable for a potassium of 2.3 and BG of 594.   Goal of Therapy:  K ~4, Mg ~2, all other electrolytes within normal limits  Plan:  --8/4 at 1811 K 2.2 after PO KCl 40 mEq x 1. Second dose was administered after lab draw. Will order additional PO KCl 40 mEq x1 and add 40 mEq/L to current fluid order.  --Re-check potassium at 2200, BMP tomorrow morning   0804 @ 2144 K+ = 3.4, improving but below target.  Will order KCL 19mEq po x 1 and f/u K+ w/ next lab draw.  Hart Robinsons A  10/23/2019 10:35 PM

## 2019-10-23 NOTE — ED Notes (Signed)
Atttempted x2 for second set of blood cultures. Unable to obtain due to hard stick. Will go ahead and start antibiotics.
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
## 2019-10-23 NOTE — Plan of Care (Signed)
Pt alert and oriented denies pain. On 3L o2 acute. Came in for fall without head injury at home. Uses Elecia Serafin to ambulate. States got dizzy tried to make to recliner and fell. Pt states cont of bowel but not bladder all the time it can come fast and makes mistakes. Purewick in place for intervention. States appetite has been fair does not complete full meal just bits and pieces from each meal per pt. Able to feed self currently on soft diet. Pt has cardiac history with pacemaker in place. Skin intact except for bruising. BUE and BLE . Bed in low position call bell within reach will continue to monitor

## 2019-10-23 NOTE — ED Notes (Signed)
Date and time results received: 10/23/19 11:54 AM  (use smartphrase ".now" to insert current time)  Test: K+ Critical Value: 2.3  Name of Provider Notified: Dr. Jacqualine Code  Orders Received? Or Actions Taken?: No new orders at this time

## 2019-10-24 ENCOUNTER — Inpatient Hospital Stay: Payer: Medicare Other | Admitting: Anesthesiology

## 2019-10-24 ENCOUNTER — Ambulatory Visit: Payer: Medicare Other | Admitting: Family

## 2019-10-24 ENCOUNTER — Encounter
Admission: EM | Disposition: A | Discharge status: Home / Self Care | Payer: Self-pay | Source: Home / Self Care | Attending: Internal Medicine

## 2019-10-24 DIAGNOSIS — E1169 Type 2 diabetes mellitus with other specified complication: Secondary | ICD-10-CM

## 2019-10-24 DIAGNOSIS — R079 Chest pain, unspecified: Secondary | ICD-10-CM

## 2019-10-24 DIAGNOSIS — E876 Hypokalemia: Secondary | ICD-10-CM

## 2019-10-24 DIAGNOSIS — I959 Hypotension, unspecified: Secondary | ICD-10-CM

## 2019-10-24 DIAGNOSIS — E871 Hypo-osmolality and hyponatremia: Secondary | ICD-10-CM

## 2019-10-24 DIAGNOSIS — J181 Lobar pneumonia, unspecified organism: Principal | ICD-10-CM

## 2019-10-24 DIAGNOSIS — I2510 Atherosclerotic heart disease of native coronary artery without angina pectoris: Secondary | ICD-10-CM

## 2019-10-24 DIAGNOSIS — I4819 Other persistent atrial fibrillation: Secondary | ICD-10-CM

## 2019-10-24 DIAGNOSIS — I4892 Unspecified atrial flutter: Secondary | ICD-10-CM

## 2019-10-24 DIAGNOSIS — E785 Hyperlipidemia, unspecified: Secondary | ICD-10-CM

## 2019-10-24 HISTORY — PX: CARDIOVERSION: SHX1299

## 2019-10-24 LAB — BASIC METABOLIC PANEL
Anion gap: 9 (ref 5–15)
Anion gap: 11 (ref 5–15)
BUN: 19 mg/dL (ref 8–23)
BUN: 19 mg/dL (ref 8–23)
CO2: 36 mmol/L — ABNORMAL HIGH (ref 22–32)
CO2: 34 mmol/L — ABNORMAL HIGH (ref 22–32)
Calcium: 9.1 mg/dL (ref 8.9–10.3)
Calcium: 9.3 mg/dL (ref 8.9–10.3)
Chloride: 92 mmol/L — ABNORMAL LOW (ref 98–111)
Chloride: 92 mmol/L — ABNORMAL LOW (ref 98–111)
Creatinine, Ser: 1.07 mg/dL — ABNORMAL HIGH (ref 0.44–1.00)
Creatinine, Ser: 1.19 mg/dL — ABNORMAL HIGH (ref 0.44–1.00)
GFR calc Af Amer: 56 mL/min — ABNORMAL LOW (ref >60)
GFR calc Af Amer: 50 mL/min — ABNORMAL LOW (ref >60)
GFR calc non Af Amer: 49 mL/min — ABNORMAL LOW (ref >60)
GFR calc non Af Amer: 43 mL/min — ABNORMAL LOW (ref >60)
Glucose, Bld: 142 mg/dL — ABNORMAL HIGH (ref 70–99)
Glucose, Bld: 179 mg/dL — ABNORMAL HIGH (ref 70–99)
Potassium: 3.4 mmol/L — ABNORMAL LOW (ref 3.5–5.1)
Potassium: 3.4 mmol/L — ABNORMAL LOW (ref 3.5–5.1)
Sodium: 137 mmol/L (ref 135–145)
Sodium: 137 mmol/L (ref 135–145)

## 2019-10-24 LAB — PROCALCITONIN: Procalcitonin: 1.79 ng/mL

## 2019-10-24 LAB — GLUCOSE, CAPILLARY
Glucose-Capillary: 124 mg/dL — ABNORMAL HIGH (ref 70–99)
Glucose-Capillary: 130 mg/dL — ABNORMAL HIGH (ref 70–99)
Glucose-Capillary: 145 mg/dL — ABNORMAL HIGH (ref 70–99)
Glucose-Capillary: 255 mg/dL — ABNORMAL HIGH (ref 70–99)
Glucose-Capillary: 263 mg/dL — ABNORMAL HIGH (ref 70–99)
Glucose-Capillary: 413 mg/dL — ABNORMAL HIGH (ref 70–99)
Glucose-Capillary: 420 mg/dL — ABNORMAL HIGH (ref 70–99)

## 2019-10-24 LAB — MAGNESIUM: Magnesium: 2 mg/dL (ref 1.7–2.4)

## 2019-10-24 LAB — PROTIME-INR
INR: 1.4 — ABNORMAL HIGH (ref 0.8–1.2)
Prothrombin Time: 16.2 seconds — ABNORMAL HIGH (ref 11.4–15.2)

## 2019-10-24 SURGERY — CARDIOVERSION
Anesthesia: General

## 2019-10-24 MED ORDER — CHLORHEXIDINE GLUCONATE CLOTH 2 % EX PADS
6.0000 | MEDICATED_PAD | Freq: Every day | CUTANEOUS | Status: DC
  Administered 2019-10-25 – 2019-10-28 (×4): 6 via TOPICAL

## 2019-10-24 MED ORDER — MORPHINE SULFATE (PF) 2 MG/ML IV SOLN: 2.0000 mg | Freq: Once | INTRAVENOUS | Status: DC

## 2019-10-24 MED ORDER — POTASSIUM CHLORIDE CRYS ER 20 MEQ PO TBCR
40.0000 meq | EXTENDED_RELEASE_TABLET | Freq: Once | ORAL | Status: AC
  Administered 2019-10-24: 40 meq via ORAL
  Filled 2019-10-24: qty 2

## 2019-10-24 MED ORDER — PROPOFOL 10 MG/ML IV BOLUS
INTRAVENOUS | Status: DC | PRN
  Administered 2019-10-24: 30 mg via INTRAVENOUS

## 2019-10-24 MED ORDER — SODIUM CHLORIDE 0.9% FLUSH: 10.0000 mL | INTRAVENOUS | Status: DC | PRN

## 2019-10-24 MED ORDER — POTASSIUM CHLORIDE IN NACL 20-0.9 MEQ/L-% IV SOLN
INTRAVENOUS | Status: DC
  Filled 2019-10-24 (×3): qty 1000

## 2019-10-24 MED ORDER — IPRATROPIUM-ALBUTEROL 0.5-2.5 (3) MG/3ML IN SOLN: 3.0000 mL | Freq: Four times a day (QID) | RESPIRATORY_TRACT | Status: DC | PRN

## 2019-10-24 NOTE — Consult Note (Addendum)
[facility] for Electrolyte Monitoring and Replacement   Recent Labs: Potassium (mmol/L)  Date Value  10/24/2019 3.4 (L)  02/19/2014 3.5   Magnesium (mg/dL)  Date Value  10/24/2019 2.0   Calcium (mg/dL)  Date Value  10/24/2019 9.1   Calcium, Total (mg/dL)  Date Value  02/19/2014 9.1   Albumin (g/dL)  Date Value  10/23/2019 3.7   Sodium (mmol/L)  Date Value  10/24/2019 137  08/13/2019 142  02/19/2014 143    Assessment: Patient is an 79 y/o F with medical history including CAD, CHF, diabetes, atrial fibrillation, hx of CVA who presented to the ED after falling at home. Labs on admission notable for a potassium of 2.3 and BG of 594.   Goal of Therapy:  K ~4, Mg ~2, all other electrolytes within normal limits  Plan:  Will give KCl 40 mEq PO x 1. On NS with KCl 40 mEq @ 75 mL/hr.    F/u with electrolytes in the AM.   Oswald Hillock, PharmD, BCPS 10/24/2019 9:40 AM

## 2019-10-24 NOTE — Transfer of Care (Signed)
Immediate Anesthesia Transfer of Care Note  [name]  Procedure(s) Performed: CARDIOVERSION (N/A )  Patient Location: PACU and Cath Lab  Anesthesia Type:General  Level of Consciousness: drowsy  Airway & Oxygen Therapy: Patient Spontanous Breathing and Patient connected to nasal cannula oxygen  Post-op Assessment: Report given to RN  Post vital signs: stable  Last Vitals:  Vitals Value Taken Time  BP 122/55 10/24/19 1332  Temp    Pulse 70 10/24/19 1333  Resp 19 10/24/19 1333  SpO2 89 % 10/24/19 1333    Last Pain:  Vitals:   10/24/19 1308  TempSrc: Oral  PainSc: 0-No pain         Complications: No complications documented.

## 2019-10-24 NOTE — Anesthesia Postprocedure Evaluation (Signed)
Anesthesia Post Note  [name]  Procedure(s) Performed: CARDIOVERSION (N/A )  Patient location during evaluation: Specials Recovery Anesthesia Type: General Level of consciousness: awake and alert Pain management: pain level controlled Vital Signs Assessment: post-procedure vital signs reviewed and stable Respiratory status: spontaneous breathing, nonlabored ventilation, respiratory function stable and patient connected to nasal cannula oxygen Cardiovascular status: blood pressure returned to baseline and stable Postop Assessment: no apparent nausea or vomiting Anesthetic complications: no   No complications documented.   Last Vitals:  Vitals:   10/24/19 1359 10/24/19 1400  BP:  130/69  Pulse: 74   Resp:  19  Temp:    SpO2: 99%     Last Pain:  Vitals:   10/24/19 1359  TempSrc:   PainSc: 0-No pain                 Martha Clan
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
## 2019-10-24 NOTE — CV Procedure (Signed)
Cardioversion note: A standard informed consent was obtained. Timeout was performed. The pads were placed in the anterior posterior fashion. The patient was given propofol by the anesthesia team.  Successful cardioversion was performed with a 100 J. The patient converted to sinus rhythm with intermittent paced rhythm. Pre-and post EKGs were reviewed. The patient tolerated the procedure with no immediate complications.  Recommendations: Continue anticoagulation.  Keep off amiodarone.  If blood pressure tolerates, will introduce a small dose beta-blocker tomorrow.

## 2019-10-24 NOTE — Evaluation (Signed)
Physical Therapy Evaluation Patient Details Name: Kristi [name] MRN: [id number] DOB: 08-12-1940 Today's Date: 10/24/2019   History of Present Illness   79 y.o. female with medical history significant for chronic diastolic dysfunction CHF, hypertension who was here just a few weeks ago with shortness of breath, LE sweeling - returns now with SOB and admitted with PNA, had a cardioversion procedure 10/24/19.  Recently had a pacemaker generator change out.   Clinical Impression  Pt reports that at baseline she does not do any prolonged ambulation/being on her feet secondary to low back pain as well as b/l knee OA.  She was able to do some limited in room ambulation (~30 ft) with slow but steady ambulation.  She did not need assist with getting to sitting, or rise to standing. Pt does not use O2 at baseline but even on 2L dropped from mid 90s to mid 80s with the effort, recovered to the 90s quickly in sitting.  Pt reports she has regular check-ins from neighbor and family, has set up aides to start coming.    Follow Up Recommendations Home health PT    Equipment Recommendations  None recommended by PT    Recommendations for Other Services       Precautions / Restrictions Precautions Precautions: Fall Restrictions Weight Bearing Restrictions: No      Mobility  Bed Mobility Overal bed mobility: Modified Independent             General bed mobility comments: Pt able to get herself to sitting EOB w/ only light rail use  Transfers Overall transfer level: Modified independent Equipment used: Rolling walker (2 wheeled)             General transfer comment: Pt able to rise w/o assist, reliant on UEs/AD to maintain   Ambulation/Gait Ambulation/Gait assistance: Supervision Gait Distance (Feet): 30 Feet Assistive device: Rolling walker (2 wheeled)       General Gait Details: Pt with slow, guarded ambulation but was able to do small loops in room w/o needing direct assist.  B/L  genu valgus, 2L O2 with sats dropping to mid 80s (back to 90s quickly in sitting).  Pt reports she does not usually walk much more than today's effort at baseline  Stairs            Wheelchair Mobility    Modified Rankin (Stroke Patients Only)       Balance Overall balance assessment: Modified Independent                                           Pertinent Vitals/Pain Pain Assessment:  (has chronic back pain with any prolonged standing)    Home Living Family/patient expects to be discharged to:: Private residence Living Arrangements: Alone Available Help at Discharge: Family;Available PRN/intermittently;Neighbor (reports that paid aides will be starting next week) Type of Home: Mobile home Home Access: Ramped entrance     [place]: One level Home Equipment: [place] - 4 wheels;Toilet riser;Wheelchair - manual      Prior Function Level of Independence: Independent with assistive device(s)               Hand Dominance        Extremity/Trunk Assessment   Upper Extremity Assessment Upper Extremity Assessment: Generalized weakness (age appropriate limitations)    Lower Extremity Assessment Lower Extremity Assessment: Generalized weakness (age appropriate limitations)  Communication   Communication: HOH  Cognition Arousal/Alertness: Awake/alert Behavior During Therapy: WFL for tasks assessed/performed Overall Cognitive Status: Within Functional Limits for tasks assessed                                        General Comments General comments (skin integrity, edema, etc.): Pt reports that she spends much of her day in the w/c (self propels with UEs)     Exercises     Assessment/Plan    PT Assessment Patient needs continued PT services  PT Problem List Decreased strength;Decreased range of motion;Decreased activity tolerance;Decreased balance;Decreased mobility;Pain;Decreased knowledge of use of DME;Decreased  safety awareness       PT Treatment Interventions DME instruction;Gait training;Functional mobility training;Therapeutic activities;Therapeutic exercise;Balance training;Patient/family education;Wheelchair mobility training;Neuromuscular re-education    PT Goals (Current goals can be found in the Care Plan section)  Acute Rehab PT Goals Patient Stated Goal: get breathing better and go home PT Goal Formulation: With patient Time For Goal Achievement: 11/07/19 Potential to Achieve Goals: Good    Frequency Min 2X/week   Barriers to discharge        Co-evaluation               AM-PAC PT "6 Clicks" Mobility  Outcome Measure Help needed turning from your back to your side while in a flat bed without using bedrails?: None Help needed moving from lying on your back to sitting on the side of a flat bed without using bedrails?: None Help needed moving to and from a bed to a chair (including a wheelchair)?: None Help needed standing up from a chair using your arms (e.g., wheelchair or bedside chair)?: None Help needed to walk in hospital room?: A Little Help needed climbing 3-5 steps with a railing? : A Lot 6 Click Score: 21    End of Session Equipment Utilized During Treatment: Gait belt;Oxygen Activity Tolerance: Patient limited by fatigue Patient left: with chair alarm set;with call bell/phone within reach Nurse Communication: Mobility status PT Visit Diagnosis: Muscle weakness (generalized) (M62.81);Difficulty in walking, not elsewhere classified (R26.2);Unsteadiness on feet (R26.81)    Time: 1335-1401 PT Time Calculation (min) (ACUTE ONLY): 26 min   Charges:   PT Evaluation $PT Eval Low Complexity: 1 Low PT Treatments $Gait Training: 8-22 mins        Kreg Shropshire, DPT 10/24/2019, 5:10 PM

## 2019-10-24 NOTE — Progress Notes (Signed)
At 5:50 am, Pt c/o chest pain 8 out of 10 and headache.  SBP 90's.  Tramadol given.  12 lead EKG done.  Hospitalist Jonny Ruiz, NP notified.  And at this time, pt denies chest pain and states her headache is going away and does not want IV morphine.

## 2019-10-24 NOTE — TOC Initial Note (Signed)
Transition of Care [facility]) - Initial/Assessment Note    Patient Details  Name: Kristi [name] MRN: [id number] Date of Birth: [birth year]  Transition of Care [facility]) CM/SW Contact:    Shelbie Ammons, RN Phone Number: 10/24/2019, 2:51 PM  Clinical Narrative:   RNCM assessed patient at bedside.  Patient reports to feeling somewhat better today, she reports that she is scheduled for a cardioversion at 1 and will be glad when that is done so she can have something to drink. Patient reports that she lives at home alone and no longer drives, she depends on the [place] or friends for her appointments. She reports that she has not had home health services in the past but she expects that within the next week or so she will begin with services from [facility] home staffing for aide services. Patient reports that she is current with Dr. Brunetta Genera and she gets her medications from [facility]. She denies any problems with affording her medications. Patient reports that she is not on oxygen at home but that she has a rollator and and wheelchair that she uses at times.               Expected Discharge Plan: Home/Self Care Barriers to Discharge: Continued Medical Work up   Patient Goals and CMS Choice        Expected Discharge Plan and Services Expected Discharge Plan: Home/Self Care   Discharge Planning Services: CM Consult   Living arrangements for the past 2 months: Single Family Home                                      Prior Living Arrangements/Services Living arrangements for the past 2 months: Single Family Home Lives with:: Self Patient language and need for interpreter reviewed:: Yes Do you feel safe going back to the place where you live?: Yes      Need for Family Participation in Patient Care: Yes (Comment) Care giver support system in place?: Yes (comment)   Criminal Activity/Legal Involvement Pertinent to Current Situation/Hospitalization: No - Comment as  needed  Activities of Daily Living Home Assistive Devices/Equipment: Wheelchair, Environmental consultant (specify type) ADL Screening (condition at time of admission) Patient's cognitive ability adequate to safely complete daily activities?: Yes Is the patient deaf or have difficulty hearing?: No Does the patient have difficulty seeing, even when wearing glasses/contacts?: No Does the patient have difficulty concentrating, remembering, or making decisions?: No Patient able to express need for assistance with ADLs?: Yes Does the patient have difficulty dressing or bathing?: Yes Independently performs ADLs?: No Communication: Independent Is this a change from baseline?: Pre-admission baseline Dressing (OT): Needs assistance Is this a change from baseline?: Pre-admission baseline Grooming: Needs assistance Is this a change from baseline?: Pre-admission baseline Feeding: Independent Is this a change from baseline?: Pre-admission baseline Bathing: Needs assistance Is this a change from baseline?: Pre-admission baseline Toileting: Needs assistance Is this a change from baseline?: Pre-admission baseline In/Out Bed: Needs assistance Is this a change from baseline?: Pre-admission baseline Walks in Home: Needs assistance Is this a change from baseline?: Pre-admission baseline Does the patient have difficulty walking or climbing stairs?: Yes Weakness of Legs: Both Weakness of Arms/Hands: None  Permission Sought/Granted                  Emotional Assessment Appearance:: Appears stated age Attitude/Demeanor/Rapport: Engaged, Gracious Affect (typically observed): Appropriate Orientation: : Oriented to Self,  Oriented to Place, Oriented to  Time, Oriented to Situation Alcohol / Substance Use: Not Applicable Psych Involvement: No (comment)  Admission diagnosis:  Pneumonia [J18.9] HCAP (healthcare-associated pneumonia) [J18.9] Sepsis, due to unspecified organism, unspecified whether acute organ  dysfunction present [facility]) [A41.9] Patient Active Problem List   Diagnosis Date Noted  . Persistent atrial fibrillation ([place])   . Hypotension   . Hyponatremia   . Lobar pneumonia ([place]) 10/23/2019  . Atrial flutter ([place])   . Dizziness   . PAF (paroxysmal atrial fibrillation) ([place]) 09/27/2019  . CHF (congestive heart failure) ([place]) 09/26/2019  . Acute on chronic diastolic heart failure ([place]) 09/25/2019  . Hypertensive urgency 09/25/2019  . Fluid overload 09/25/2019  . Excessive gas 05/14/2019  . Generalized bloating 05/14/2019  . Rectal bleeding 05/14/2019  . 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 Chest pain 11/20/2018  . Difficulty walking 11/19/2018  . Insomnia 11/15/2018  . Type 2 diabetes mellitus with hyperlipidemia ([place]) 11/15/2018  . Goals of care, counseling/discussion 02/06/2018  . 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 Rectal mass 01/05/2018  . Leg pain, bilateral 12/06/2017  . Lymphedema 07/10/2017  . Stricture and stenosis of esophagus   . Dysphagia   . Syncope and collapse 04/11/2017  . Mild dementia ([place]) 01/24/2017  . Lumbar facet osteoarthritis (Bilateral) 01/17/2017  . Acute postoperative pain 01/17/2017  . Hypokalemia 12/24/2016  . Anxiety 12/07/2016  . 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 Cerebrovascular accident ([place]) 12/07/2016  . Chronic depression 12/07/2016  . Essential hypertension 12/07/2016  . Hypothyroidism 12/07/2016  . Mixed hyperlipidemia 12/07/2016  . Myocardial infarction ([place]) 12/07/2016  . TIA (transient ischemic attack) 11/17/2016  . Parkinson disease ([place]) 10/27/2016  . Tremor 10/27/2016  . Chronic knee pain (B) (R>L) 10/19/2016  . Ischemic chest pain ([place]) 09/29/2016  . Abnormal CT scan, lumbar spine 09/08/2016  . Lumbar foraminal stenosis (multilevel) 09/08/2016  . Fracture of clavicle 08/03/2016  . Polyneuropathy 05/17/2016  . Neurogenic pain 04/12/2016  . Chronic lower  extremity pain (Bilateral) (R>L) 04/12/2016  . Chronic lower extremity radicular pain (Primary Source of Pain) (Bilateral) (R>L) 04/12/2016  . Lower extremity weakness 04/12/2016  . Chronic low back pain (Secondary source of pain) (Bilateral) (R>L) 04/12/2016  . Chronic wrist pain (Left) (secondary to fracture) 04/12/2016  . Lumbar spondylosis 04/12/2016  . Chronic pain syndrome 04/11/2016  . Long term current use of opiate analgesic 04/11/2016  . Long term prescription opiate use 04/11/2016  . Opiate use 04/11/2016  . Long term prescription benzodiazepine use 04/11/2016  . Lumbar radiculopathy (Multilevel) (Bilateral) 05/07/2015  . Lumbar facet syndrome (Bilateral) (R>L) 08/21/2014  . 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 Sacroiliac joint dysfunction (Bilateral) 08/13/2014  . Frequent falls 06/04/2014  . Memory loss 06/04/2014  . Anxiety and depression 08/22/2013  . CAD (coronary artery disease) 08/22/2013  . Therapeutic opioid-induced constipation (OIC) 08/22/2013  . GERD (gastroesophageal reflux disease) 08/22/2013   PCP:  Lorelee Market, MD Pharmacy:   [place], [place] [address] Phone: [phone] Fax: [phone]  [place], [place] - [address] [place] [address] Phone: [phone] Fax: [phone]  [place], [place] - [address] [place] [address] Phone: [phone] Fax: [phone]     Social Determinants of Health (SDOH) Interventions  Readmission Risk Interventions Readmission Risk Prevention Plan 10/24/2019  Transportation Screening Complete  Medication Review Press photographer) Complete  PCP or Specialist appointment within 3-5 days of discharge Complete  SW Recovery Care/Counseling Consult Complete  [place] Not Applicable  Some recent data might be hidden

## 2019-10-25 ENCOUNTER — Encounter: Payer: Self-pay | Admitting: Cardiovascular Disease

## 2019-10-25 ENCOUNTER — Ambulatory Visit: Payer: Medicare Other | Admitting: Cardiology

## 2019-10-25 DIAGNOSIS — R197 Diarrhea, unspecified: Secondary | ICD-10-CM

## 2019-10-25 DIAGNOSIS — I5032 Chronic diastolic (congestive) heart failure: Secondary | ICD-10-CM

## 2019-10-25 DIAGNOSIS — K921 Melena: Secondary | ICD-10-CM

## 2019-10-25 LAB — LIPID PANEL
Cholesterol: 131 mg/dL (ref 0–200)
HDL: 72 mg/dL (ref >40)
LDL Cholesterol: 43 mg/dL (ref 0–99)
Total CHOL/HDL Ratio: 1.8 RATIO
Triglycerides: 78 mg/dL (ref <150)
VLDL: 16 mg/dL (ref 0–40)

## 2019-10-25 LAB — BASIC METABOLIC PANEL
Anion gap: 8 (ref 5–15)
BUN: 17 mg/dL (ref 8–23)
CO2: 31 mmol/L (ref 22–32)
Calcium: 9 mg/dL (ref 8.9–10.3)
Chloride: 96 mmol/L — ABNORMAL LOW (ref 98–111)
Creatinine, Ser: 1.02 mg/dL — ABNORMAL HIGH (ref 0.44–1.00)
GFR calc Af Amer: 60 mL/min — ABNORMAL LOW (ref >60)
GFR calc non Af Amer: 52 mL/min — ABNORMAL LOW (ref >60)
Glucose, Bld: 188 mg/dL — ABNORMAL HIGH (ref 70–99)
Potassium: 3.4 mmol/L — ABNORMAL LOW (ref 3.5–5.1)
Sodium: 135 mmol/L (ref 135–145)

## 2019-10-25 LAB — GASTROINTESTINAL PANEL BY PCR, STOOL (REPLACES STOOL CULTURE)

## 2019-10-25 LAB — CBC
HCT: 30.7 % — ABNORMAL LOW (ref 36.0–46.0)
Hemoglobin: 9.9 g/dL — ABNORMAL LOW (ref 12.0–15.0)
MCH: 30.5 pg (ref 26.0–34.0)
MCHC: 32.2 g/dL (ref 30.0–36.0)
MCV: 94.5 fL (ref 80.0–100.0)
Platelets: 131 10*3/uL — ABNORMAL LOW (ref 150–400)
RBC: 3.25 MIL/uL — ABNORMAL LOW (ref 3.87–5.11)
RDW: 15.3 % (ref 11.5–15.5)
WBC: 6.7 10*3/uL (ref 4.0–10.5)
nRBC: 0 % (ref 0.0–0.2)

## 2019-10-25 LAB — C DIFFICILE QUICK SCREEN W PCR REFLEX
C Diff antigen: NEGATIVE
C Diff interpretation: NOT DETECTED
C Diff toxin: NEGATIVE

## 2019-10-25 LAB — GLUCOSE, CAPILLARY
Glucose-Capillary: 150 mg/dL — ABNORMAL HIGH (ref 70–99)
Glucose-Capillary: 162 mg/dL — ABNORMAL HIGH (ref 70–99)
Glucose-Capillary: 163 mg/dL — ABNORMAL HIGH (ref 70–99)
Glucose-Capillary: 184 mg/dL — ABNORMAL HIGH (ref 70–99)
Glucose-Capillary: 241 mg/dL — ABNORMAL HIGH (ref 70–99)
Glucose-Capillary: 264 mg/dL — ABNORMAL HIGH (ref 70–99)

## 2019-10-25 LAB — HEMOGLOBIN
Hemoglobin: 8.9 g/dL — ABNORMAL LOW (ref 12.0–15.0)
Hemoglobin: 8.1 g/dL — ABNORMAL LOW (ref 12.0–15.0)

## 2019-10-25 LAB — PROCALCITONIN: Procalcitonin: 0.96 ng/mL

## 2019-10-25 LAB — MAGNESIUM: Magnesium: 2 mg/dL (ref 1.7–2.4)

## 2019-10-25 MED ORDER — MAGNESIUM SULFATE 2 GM/50ML IV SOLN
2.0000 g | Freq: Once | INTRAVENOUS | Status: AC
  Administered 2019-10-25: 2 g via INTRAVENOUS
  Filled 2019-10-25: qty 50

## 2019-10-25 MED ORDER — POTASSIUM CHLORIDE CRYS ER 20 MEQ PO TBCR: 40.0000 meq | EXTENDED_RELEASE_TABLET | Freq: Once | ORAL | Status: DC

## 2019-10-25 MED ORDER — TORSEMIDE 20 MG PO TABS
20.0000 mg | ORAL_TABLET | Freq: Every day | ORAL | Status: DC
  Administered 2019-10-25 – 2019-10-28 (×4): 20 mg via ORAL
  Filled 2019-10-25 (×4): qty 1

## 2019-10-25 MED ORDER — IPRATROPIUM-ALBUTEROL 0.5-2.5 (3) MG/3ML IN SOLN
3.0000 mL | Freq: Four times a day (QID) | RESPIRATORY_TRACT | Status: DC
  Administered 2019-10-25 – 2019-10-26 (×3): 3 mL via RESPIRATORY_TRACT
  Filled 2019-10-25 (×3): qty 3

## 2019-10-25 MED ORDER — ONDANSETRON HCL 4 MG/2ML IJ SOLN
4.0000 mg | Freq: Four times a day (QID) | INTRAMUSCULAR | Status: DC | PRN
  Filled 2019-10-25: qty 2

## 2019-10-25 MED ORDER — ZINC OXIDE 40 % EX OINT
TOPICAL_OINTMENT | Freq: Two times a day (BID) | CUTANEOUS | Status: DC
  Filled 2019-10-25: qty 113

## 2019-10-25 MED ORDER — LOPERAMIDE HCL 2 MG PO CAPS: 2.0000 mg | ORAL_CAPSULE | ORAL | Status: DC | PRN

## 2019-10-25 MED ORDER — TRAZODONE HCL 50 MG PO TABS
50.0000 mg | ORAL_TABLET | Freq: Every day | ORAL | Status: DC
  Administered 2019-10-25: 50 mg via ORAL
  Filled 2019-10-25: qty 1

## 2019-10-25 MED ORDER — POTASSIUM CHLORIDE CRYS ER 20 MEQ PO TBCR
20.0000 meq | EXTENDED_RELEASE_TABLET | Freq: Two times a day (BID) | ORAL | Status: DC
  Administered 2019-10-25 – 2019-10-26 (×3): 20 meq via ORAL
  Filled 2019-10-25 (×3): qty 1

## 2019-10-25 MED ORDER — AZITHROMYCIN 250 MG PO TABS
250.0000 mg | ORAL_TABLET | Freq: Every day | ORAL | Status: DC
  Administered 2019-10-26 – 2019-10-27 (×2): 250 mg via ORAL
  Filled 2019-10-25 (×2): qty 1

## 2019-10-25 MED ORDER — METOPROLOL TARTRATE 25 MG PO TABS
12.5000 mg | ORAL_TABLET | Freq: Two times a day (BID) | ORAL | Status: DC
  Administered 2019-10-25 – 2019-10-27 (×4): 12.5 mg via ORAL
  Filled 2019-10-25 (×5): qty 1

## 2019-10-25 NOTE — Consult Note (Addendum)
[facility] for Electrolyte Monitoring and Replacement   Recent Labs: Potassium (mmol/L)  Date Value  10/25/2019 3.4 (L)  02/19/2014 3.5   Magnesium (mg/dL)  Date Value  10/25/2019 2.0   Calcium (mg/dL)  Date Value  10/25/2019 9.0   Calcium, Total (mg/dL)  Date Value  02/19/2014 9.1   Albumin (g/dL)  Date Value  10/23/2019 3.7   Sodium (mmol/L)  Date Value  10/25/2019 135  08/13/2019 142  02/19/2014 143    Assessment: Patient is an 79 y/o F with medical history including CAD, CHF, diabetes, atrial fibrillation, hx of CVA who presented to the ED after falling at home. Labs on admission notable for a potassium of 2.3 and BG of 594. Patient is receiving KCl 20 mEq BID scheduled. Patient's K remained at 3.4 after receiving 40 mEq of KCl PO on 8/5 and ~40 mEq IV. Patient has been having diarrhea.  Goal of Therapy:  K ~4, Mg ~2, all other electrolytes within normal limits  Plan:  Will give KCl 40 mEq PO x 1.   F/u with electrolytes in the AM.   Benn Moulder, PharmD Pharmacy Resident  10/25/2019 1:12 PM

## 2019-10-25 NOTE — Care Management Important Message (Signed)
Important Message  Patient Details  Name: Kristi [name] MRN: [id number] Date of Birth: 28-Aug-1940   Medicare Important Message Given:  N/A - LOS <3 / Initial given by admissions     [name] 10/25/2019, 8:51 AM

## 2019-10-25 NOTE — Plan of Care (Signed)

## 2019-10-25 NOTE — Progress Notes (Signed)
Progress Note  Patient Name: Kristi [name] Date of Encounter: 10/25/2019  Primary Cardiologist: Agbor-Etang  Subjective   Status post successful DCCV 10/24/2019 and is maintaining sinus rhythm. She had 2 episodes of diarrhea yesterday, both with frank BRBPR. No chest pain, dizziness, dyspnea, or palpitations.   Inpatient Medications    Scheduled Meds: . apixaban  5 mg Oral BID  . Chlorhexidine Gluconate Cloth  6 each Topical Daily  . cholecalciferol  1,000 Units Oral Daily  . cholestyramine  4 g Oral Daily  . donepezil  10 mg Oral Daily  . gabapentin  300 mg Oral q morning - 10a  . gabapentin  600 mg Oral QHS  . hydrOXYzine  25 mg Oral BID  . insulin aspart  0-9 Units Subcutaneous Q4H  . levothyroxine  200 mcg Oral Q0600  . loratadine  10 mg Oral Daily  . montelukast  10 mg Oral QHS  . multivitamin with minerals  1 tablet Oral Daily  . OLANZapine  2.5 mg Oral Daily  . pantoprazole  40 mg Oral Daily  . potassium chloride  20 mEq Oral BID  . rosuvastatin  40 mg Oral Daily  . sertraline  100 mg Oral BID  . vitamin B-12  1,000 mcg Oral Daily  . vitamin E  400 Units Oral Daily   Continuous Infusions: . azithromycin Stopped (10/24/19 1212)  . cefTRIAXone (ROCEPHIN)  IV Stopped (10/24/19 1033)   PRN Meds: ipratropium-albuterol, meclizine, nitroGLYCERIN, sodium chloride flush, traMADol   Vital Signs    Vitals:   10/24/19 1641 10/24/19 1930 10/25/19 0449 10/25/19 0800  BP: (!) 152/60 (!) 134/93 105/60 (!) 160/72  Pulse: 82 82 68 75  Resp: 19 20 20 19   Temp:  97.8 F (36.6 C) 98.8 F (37.1 C) 97.7 F (36.5 C)  TempSrc:  Oral Oral Oral  SpO2:  100% 91% 97%  Weight:      Height:        Intake/Output Summary (Last 24 hours) at 10/25/2019 0827 Last data filed at 10/24/2019 1830 Gross per 24 hour  Intake 984.68 ml  Output --  Net 984.68 ml   Filed Weights   10/23/19 1111 10/23/19 1803  Weight: 88.9 kg 81.7 kg    Telemetry    SR, 70s bpm - Personally  Reviewed  ECG    NSR with occasional paced complexes, 76 bpm, rare PAC, nonspecific st/t changes  - Personally Reviewed  Physical Exam   GEN: No acute distress.   Neck: No JVD. Cardiac: RRR, no murmurs, rubs, or gallops.  Respiratory: Clear to auscultation bilaterally.  GI: Soft, mild diffuse tenderness, non-distended.   MS: No edema; No deformity. Neuro:  Alert and oriented x 3; Nonfocal.  Psych: Normal affect.  Labs    Chemistry Recent Labs  Lab 10/23/19 1109 10/23/19 1811 10/24/19 0707 10/24/19 0940 10/25/19 0600  NA 128*  --  137 137 135  K 2.3*   < > 3.4* 3.4* 3.4*  CL 81*  --  92* 92* 96*  CO2 32  --  36* 34* 31  GLUCOSE 594*  --  142* 179* 188*  BUN 22  --  19 19 17   CREATININE 1.11*  --  1.07* 1.19* 1.02*  CALCIUM 8.3*  --  9.1 9.3 9.0  PROT 7.3  --   --   --   --   ALBUMIN 3.7  --   --   --   --   AST 52*  --   --   --   --  ALT 50*  --   --   --   --   ALKPHOS 118  --   --   --   --   BILITOT 0.8  --   --   --   --   GFRNONAA 47*  --  49* 43* 52*  GFRAA 54*  --  56* 50* 60*  ANIONGAP 15  --  9 11 8    < > = values in this interval not displayed.     Hematology Recent Labs  Lab 10/23/19 1109  WBC 9.9  RBC 3.43*  HGB 10.4*  HCT 31.4*  MCV 91.5  MCH 30.3  MCHC 33.1  RDW 14.6  PLT 122*    Cardiac EnzymesNo results for input(s): TROPONINI in the last 168 hours. No results for input(s): TROPIPOC in the last 168 hours.   BNPNo results for input(s): BNP, PROBNP in the last 168 hours.   DDimer No results for input(s): DDIMER in the last 168 hours.   Radiology    DG Chest 2 View  Result Date: 10/23/2019 IMPRESSION: Streaky right basilar airspace opacity, atelectasis versus infiltrate. Electronically Signed   By: Davina Poke D.O.   On: 10/23/2019 11:59   CT Head Wo Contrast  Result Date: 10/23/2019 IMPRESSION: 1. No acute intracranial pathology. 2. Chronic microvascular disease and cerebral atrophy. Electronically Signed   By: Kathreen Devoid    On: 10/23/2019 12:52   CT ABDOMEN PELVIS W CONTRAST  Result Date: 10/23/2019 IMPRESSION: 1.  No acute traumatic injury identified. 2. Partially visible peribronchial opacity at the right lung base suspicious for developing Bronchopneumonia. No pleural effusion. 3. Chronic hiatal hernia, sigmoid diverticulosis, lumbar spine degeneration. Chronic cardiomegaly. Aortic Atherosclerosis (ICD10-I70.0). Electronically Signed   By: Genevie Ann M.D.   On: 10/23/2019 12:57    Cardiac Studies   2D echo 07/2019: 1. Left ventricular ejection fraction, by estimation, is 50 to 55%. The  left ventricle has low normal function. Left ventricular endocardial  border not optimally defined to evaluate regional wall motion. There is  mild left ventricular hypertrophy. Left  ventricular diastolic parameters are indeterminate.  2. Right ventricular systolic function is normal. The right ventricular  size is normal. There is moderately elevated pulmonary artery systolic  pressure.  3. Left atrial size was severely dilated.  4. Right atrial size was mildly dilated.  5. The mitral valve is normal in structure. Moderate mitral valve  regurgitation. No evidence of mitral stenosis.  6. Tricuspid valve regurgitation is severe.  7. The aortic valve is normal in structure. Aortic valve regurgitation is  trivial. Mild aortic valve sclerosis is present, with no evidence of  aortic valve stenosis.  8. Moderately dilated pulmonary artery.  9. The inferior vena cava is normal in size with <50% respiratory  variability, suggesting right atrial pressure of 8 mmHg.  Patient Profile     79 y.o. female with history of CAD s/p 3-vessel CABG with subsequent PCI x 5, persistent Afib/flutter diagnosed in 09/2019 on Eliquis, HFpEF, pulmonary hypertension, sinus node dysfunction s/p MDT PPM with lead revision and generator change out in 08/2019, DM2, HTN, HLD, anemia, and dementia who we are seeing for persistent  Afib/flutter.  Assessment & Plan    1. Persistent Afib/flutter: -Initially diagnosed in 09/2019 -Now off amiodarone given this was her first episode of Afib and with noted GI adverse effects -Status post successful DCCV, maintaining sinus rhythm -Add low dose metoprolol 12.5 mg bid, escalate as needed -CHADS2VASc at least 7 (  CHF, HTN, age x 2, DM, vascular disease, gender) -Eliquis 5 mg bid as she does not meet reduced dosing criteria at this time  2. Dizziness/weakness: -Suspected to be in the setting of severe hypokalemia along with diarrhea, PNA, and hypotension -Improving   3. CAD s/p CABG s/p subsequent PCI: -No symptoms concerning for angina -On Eliquis in place of ASA -Crestor  -No plans for inpatient ischemic evaluation at this time  4. Hypokalemia: -Possibly in the setting of diarrhea with studies pending -Recommend continued repletion to goal of 4.0 -Magnesium at goal  5. HFpEF/pulmonary hypertension: -She appears euvolemic and well compensated  -Volume loss with diarrhea currently -No indication for diuresis at this time  6. HTN: -Blood pressure is elevated this morning in the 859M systolic, though this was soft previously  -Add low dose metoprolol 12.5 mg bid as above  7. HLD: -Last LDL 85 from 2018 with goal < 70 -Check LDL -Crestor 40 mg daily -Add Zetia if indicated   8. Anemia: -Stable   9. Diarrhea with hematochezia: -Stool studies pending -Check CBC -Contributing to her hypokalemia at this point   For questions or updates, please contact [place] Please consult [email or web address] for contact info under Cardiology/STEMI.    Signed, Christell Faith, PA-C [place] Pager: [phone] 10/25/2019, 8:27 AM

## 2019-10-25 NOTE — Progress Notes (Signed)
Patient ID: Kristi [name], female   DOB: [birth year], 79 y.o.   MRN: [id number] [facility] PROGRESS NOTE  Kristi [name] MGQ:[id number] DOB: 1940-11-08 DOA: 10/23/2019 PCP: Lorelee Market, MD  HPI/Subjective: Patient not feeling well at all.  Had few episodes of diarrhea last night with some blood.  Has some epigastric pain.  Has some headache.  Feels nauseous.  Objective: Vitals:   10/25/19 0800 10/25/19 1130  BP: (!) 160/72 119/66  Pulse: 75 65  Resp: 19 19  Temp: 97.7 F (36.5 C) 97.9 F (36.6 C)  SpO2: 97%     Intake/Output Summary (Last 24 hours) at 10/25/2019 1344 Last data filed at 10/24/2019 1830 Gross per 24 hour  Intake 984.68 ml  Output --  Net 984.68 ml   Filed Weights   10/23/19 1111 10/23/19 1803  Weight: 88.9 kg 81.7 kg    ROS: Review of Systems  Constitutional: Positive for malaise/fatigue.  Respiratory: Positive for shortness of breath.   Cardiovascular: Negative for chest pain.  Gastrointestinal: Positive for abdominal pain, blood in stool, diarrhea and nausea.  Neurological: Positive for headaches.   Exam: Physical Exam HENT:     Nose: No mucosal edema.     Mouth/Throat:     Pharynx: No oropharyngeal exudate.  Eyes:     General: Lids are normal.     Extraocular Movements: Extraocular movements intact.     Pupils: Pupils are equal, round, and reactive to light.  Cardiovascular:     Rate and Rhythm: Normal rate and regular rhythm.     Heart sounds: Normal heart sounds, S1 normal and S2 normal.  Pulmonary:     Breath sounds: Examination of the right-lower field reveals decreased breath sounds and wheezing. Examination of the left-lower field reveals decreased breath sounds and wheezing. Decreased breath sounds and wheezing present. No rhonchi or rales.  Abdominal:     Palpations: Abdomen is soft.     Tenderness: There is abdominal tenderness in the epigastric area.  Musculoskeletal:     Right lower leg: Swelling present.     Left lower leg:  Swelling present.  Skin:    General: Skin is warm.     Findings: No rash.  Neurological:     Mental Status: She is alert and oriented to person, place, and time.       Data Reviewed: Basic Metabolic Panel: Recent Labs  Lab 10/23/19 1109 10/23/19 1109 10/23/19 1811 10/23/19 2144 10/24/19 0707 10/24/19 0940 10/25/19 0600  NA 128*  --   --   --  137 137 135  K 2.3*   < > 2.2* 3.4* 3.4* 3.4* 3.4*  CL 81*  --   --   --  92* 92* 96*  CO2 32  --   --   --  36* 34* 31  GLUCOSE 594*  --   --   --  142* 179* 188*  BUN 22  --   --   --  19 19 17   CREATININE 1.11*  --   --   --  1.07* 1.19* 1.02*  CALCIUM 8.3*  --   --   --  9.1 9.3 9.0  MG 1.9  --   --   --  2.0  --  2.0   < > = values in this interval not displayed.   Liver Function Tests: Recent Labs  Lab 10/23/19 1109  AST 52*  ALT 50*  ALKPHOS 118  BILITOT 0.8  PROT 7.3  ALBUMIN 3.7   CBC:  Recent Labs  Lab 10/23/19 1109 10/25/19 0903 10/25/19 1255  WBC 9.9 6.7  --   NEUTROABS 8.2*  --   --   HGB 10.4* 9.9* 8.9*  HCT 31.4* 30.7*  --   MCV 91.5 94.5  --   PLT 122* 131*  --   BNP (last 3 results) Recent Labs    09/27/19 0534 09/28/19 0607 09/29/19 0808  BNP 101.6* 486.7* 359.9*    CBG: Recent Labs  Lab 10/24/19 2002 10/24/19 2347 10/25/19 0422 10/25/19 0801 10/25/19 1128  GLUCAP 420* 263* 150* 163* 162*    Recent Results (from the past 240 hour(s))  SARS Coronavirus 2 by RT PCR (hospital order, performed in [facility] hospital lab) Nasopharyngeal Nasopharyngeal Swab     Status: None   Collection Time: 10/23/19 11:09 AM   Specimen: Nasopharyngeal Swab  Result Value Ref Range Status   SARS Coronavirus 2 NEGATIVE NEGATIVE Final    Comment: (NOTE) SARS-CoV-2 target nucleic acids are NOT DETECTED.  The SARS-CoV-2 RNA is generally detectable in upper and lower respiratory specimens during the acute phase of infection. The lowest concentration of SARS-CoV-2 viral copies this assay can detect is  250 copies / mL. A negative result does not preclude SARS-CoV-2 infection and should not be used as the sole basis for treatment or other patient management decisions.  A negative result may occur with improper specimen collection / handling, submission of specimen other than nasopharyngeal swab, presence of viral mutation(s) within the areas targeted by this assay, and inadequate number of viral copies (<250 copies / mL). A negative result must be combined with clinical observations, patient history, and epidemiological information.  Fact Sheet for Patients:   StrictlyIdeas.no  Fact Sheet for Healthcare Providers: BankingDealers.co.za  This test is not yet approved or  cleared by the [place] FDA and has been authorized for detection and/or diagnosis of SARS-CoV-2 by FDA under an Emergency Use Authorization (EUA).  This EUA will remain in effect (meaning this test can be used) for the duration of the COVID-19 declaration under Section 564(b)(1) of the Act, 21 U.S.C. section 360bbb-3(b)(1), unless the authorization is terminated or revoked sooner.  Performed at [facility], [address] [place], [place] [address]   Culture, blood (Routine X 2) w Reflex to ID Panel     Status: None (Preliminary result)   Collection Time: 10/23/19  1:57 PM   Specimen: BLOOD  Result Value Ref Range Status   Specimen Description BLOOD RIGHT ANTECUBITAL  Final   Special Requests   Final    BOTTLES DRAWN AEROBIC AND ANAEROBIC Blood Culture results may not be optimal due to an inadequate volume of blood received in culture bottles   Culture   Final    NO GROWTH 2 DAYS Performed at [facility], [address], [place], [place] [address]    Report Status PENDING  Incomplete  Culture, blood (Routine X 2) w Reflex to ID Panel     Status: None (Preliminary result)   Collection Time: 10/23/19  6:10 PM   Specimen: BLOOD  Result  Value Ref Range Status   Specimen Description BLOOD RIGHT ANTECUBITAL  Final   Special Requests   Final    BOTTLES DRAWN AEROBIC AND ANAEROBIC Blood Culture adequate volume   Culture   Final    NO GROWTH 2 DAYS Performed at [facility], [address] [place],  [address]    Report Status PENDING  Incomplete  C Difficile Quick Screen  w PCR reflex     Status: None   Collection Time: 10/25/19  9:13 AM   Specimen: STOOL  Result Value Ref Range Status   C Diff antigen NEGATIVE NEGATIVE Final   C Diff toxin NEGATIVE NEGATIVE Final   C Diff interpretation No C. difficile detected.  Final    Comment: Performed at [facility], [address] [place], [place] [address]  Gastrointestinal Panel by PCR , Stool     Status: None   Collection Time: 10/25/19  9:13 AM   Specimen: Stool  Result Value Ref Range Status   Campylobacter species NOT DETECTED NOT DETECTED Final   Plesimonas shigelloides NOT DETECTED NOT DETECTED Final   Salmonella species NOT DETECTED NOT DETECTED Final   Yersinia enterocolitica NOT DETECTED NOT DETECTED Final   Vibrio species NOT DETECTED NOT DETECTED Final   Vibrio cholerae NOT DETECTED NOT DETECTED Final   Enteroaggregative E coli (EAEC) NOT DETECTED NOT DETECTED Final   Enteropathogenic E coli (EPEC) NOT DETECTED NOT DETECTED Final   Enterotoxigenic E coli (ETEC) NOT DETECTED NOT DETECTED Final   Shiga like toxin producing E coli (STEC) NOT DETECTED NOT DETECTED Final   Shigella/Enteroinvasive E coli (EIEC) NOT DETECTED NOT DETECTED Final   Cryptosporidium NOT DETECTED NOT DETECTED Final   Cyclospora cayetanensis NOT DETECTED NOT DETECTED Final   Entamoeba histolytica NOT DETECTED NOT DETECTED Final   Giardia lamblia NOT DETECTED NOT DETECTED Final   Adenovirus F40/41 NOT DETECTED NOT DETECTED Final   Astrovirus NOT DETECTED NOT DETECTED Final   Norovirus GI/GII NOT DETECTED NOT DETECTED Final   Rotavirus A NOT DETECTED NOT DETECTED  Final   Sapovirus (I, II, IV, and V) NOT DETECTED NOT DETECTED Final    Comment: Performed at [facility], [address] [place], [place] [address]     Scheduled Meds:  apixaban  5 mg Oral BID   azithromycin  250 mg Oral Daily   Chlorhexidine Gluconate Cloth  6 each Topical Daily   cholecalciferol  1,000 Units Oral Daily   cholestyramine  4 g Oral Daily   donepezil  10 mg Oral Daily   gabapentin  300 mg Oral q morning - 10a   gabapentin  600 mg Oral QHS   hydrOXYzine  25 mg Oral BID   insulin aspart  0-9 Units Subcutaneous Q4H   ipratropium-albuterol  3 mL Nebulization Q6H   levothyroxine  200 mcg Oral Q0600   liver oil-zinc oxide   Topical BID   loratadine  10 mg Oral Daily   metoprolol tartrate  12.5 mg Oral BID   montelukast  10 mg Oral QHS   multivitamin with minerals  1 tablet Oral Daily   OLANZapine  2.5 mg Oral Daily   pantoprazole  40 mg Oral Daily   potassium chloride  20 mEq Oral BID   potassium chloride  40 mEq Oral Once   rosuvastatin  40 mg Oral Daily   sertraline  100 mg Oral BID   torsemide  20 mg Oral Daily   traZODone  50 mg Oral QHS   vitamin B-12  1,000 mcg Oral Daily   vitamin E  400 Units Oral Daily   Continuous Infusions:  cefTRIAXone (ROCEPHIN)  IV 2 g (10/25/19 1019)    Assessment/Plan:  1. Right lobar pneumonia on Rocephin and Zithromax.  Sepsis ruled out.  Heart rate elevated secondary to atrial fibrillation 2. Persistent atrial fibrillation status post cardioversion back into normal sinus rhythm.  Patient  on Eliquis. 3. Diarrhea with bright red blood per rectum.  Stool studies negative including C. difficile.  As needed Imodium.  Since the patient just had a cardioversion I will try to keep on Eliquis but if the patient has a major bleed may have to stop.  Serial hemoglobins.  Hemoglobin drifted down to 8.9.  Stop IV fluids.  If has another bleeding episode can consider bleeding scan and potentially holding  Eliquis. 4. Severe hypokalemia on oral potassium 5. Hyponatremia improved with IV fluids 6. Hypotension has improved.  Cardiology restarted metoprolol 7. Hyperlipidemia unspecified on Crestor 8. Depression on Zoloft 9. Hypothyroidism unspecified on levothyroxine     Code Status:     Code Status Orders  (From admission, onward)         Start     Ordered   10/23/19 1359  Full code  Continuous        10/23/19 1401        Code Status History    Date Active Date Inactive Code Status Order ID Comments User Context   09/25/2019 1138 09/30/2019 2324 Full Code [id number]  Collier Bullock, MD ED   09/05/2019 1035 09/05/2019 1658 Full Code [id number]  [name], MD Inpatient   09/05/2019 1034 09/05/2019 1035 Full Code [id number]  [name], MD Inpatient   11/20/2018 2158 11/21/2018 2040 DNR [id number]  Vaughan Basta, MD Inpatient   04/11/2017 0602 04/18/2017 1834 Full Code [id number]  Saundra Shelling, MD Inpatient   12/24/2016 0640 12/26/2016 2020 Full Code [id number]  Harrie Foreman, MD ED   11/17/2016 0450 11/17/2016 1954 Full Code [id number]  Saundra Shelling, MD Inpatient   09/29/2016 2002 09/30/2016 1605 Full Code [id number]  Epifanio Lesches, MD ED   Advance Care Planning Activity    Advance Directive Documentation     Most Recent Value  Type of Advance Directive Healthcare Power of Attorney  Pre-existing out of facility DNR order (yellow form or pink MOST form) --  "MOST" Form in Place? --     Family Communication: Spoke with daughter on the phone Disposition Plan: Status is: Inpatient  Dispo: The patient is from: Home              Anticipated d/c is to: Home              Anticipated d/c date is: With rectal bleeding today and diarrhea overnight I will have to watch the patient further.  Likely will need a few more days in the hospital.              Patient currently on Eliquis after cardioversion.  Cardiology would like to have Eliquis continue because the patient is  higher risk of clot formation if we stop it.  But the patient did have rectal bleeding today.  Continue to monitor closely.  Consultants:  Cardiology  Antibiotics:  Rocephin  Zithromax  Time spent: 28 minutes, case discussed with cardiology  [place]

## 2019-10-25 NOTE — Progress Notes (Signed)
Inpatient Diabetes Program Recommendations  AACE/ADA: New Consensus Statement on Inpatient Glycemic Control   Target Ranges:  Prepandial:   less than 140 mg/dL      Peak postprandial:   less than 180 mg/dL (1-2 hours)      Critically ill patients:  140 - 180 mg/dL   Results for Kristi [name], Kristi [name] (MRN [id number]) as of 10/25/2019 11:28  Ref. Range 10/24/2019 07:52 10/24/2019 12:31 10/24/2019 16:42 10/24/2019 20:02 10/24/2019 23:47 10/25/2019 04:22 10/25/2019 08:01  Glucose-Capillary Latest Ref Range: 70 - 99 mg/dL 130 (H) 124 (H) 255 (H) 420 (H) 263 (H) 150 (H) 163 (H)   Review of Glycemic Control  Diabetes history: DM2 Outpatient Diabetes medications: Actos 30 mg daily Current orders for Inpatient glycemic control: Novolog 0-9 units Q4H Inpatient Diabetes Program Recommendations:   Insulin-Please consider ordering Novolog 3 units TID with meals for meal coverage if patient eats at least 50% of meals.  Outpatient DM medications: Noted patient has a history of CHF and has Actos on home medication list for DM. Actos should not be used in patient with CHF due to side effect of fluid retention. Please consider discontinuing Actos and use different DM medication for DM management  NOTE: In reviewing chart, noted patient in ED after fall at home and noted to have initial glucose of 594 mg/dl on labs.  Patient has Actos listed as only DM medication on home medication list. Patient has history of CHF; therefore should not be taking Actos due to side effect of fluid retention. Noted patient was recently inpatient 09/25/19-09/30/19 and per discharge summary on 09/30/19 patient was instructed to stop Amaryl and continue Actos.   Thanks, Barnie Alderman, RN, MSN, CDE Diabetes Coordinator Inpatient Diabetes Program [phone] (Team Pager from 8am to 5pm)

## 2019-10-26 DIAGNOSIS — R410 Disorientation, unspecified: Secondary | ICD-10-CM

## 2019-10-26 DIAGNOSIS — J189 Pneumonia, unspecified organism: Secondary | ICD-10-CM

## 2019-10-26 DIAGNOSIS — I4891 Unspecified atrial fibrillation: Secondary | ICD-10-CM

## 2019-10-26 DIAGNOSIS — K625 Hemorrhage of anus and rectum: Secondary | ICD-10-CM

## 2019-10-26 LAB — BASIC METABOLIC PANEL
Anion gap: 13 (ref 5–15)
BUN: 12 mg/dL (ref 8–23)
CO2: 30 mmol/L (ref 22–32)
Calcium: 8.8 mg/dL — ABNORMAL LOW (ref 8.9–10.3)
Chloride: 94 mmol/L — ABNORMAL LOW (ref 98–111)
Creatinine, Ser: 1.05 mg/dL — ABNORMAL HIGH (ref 0.44–1.00)
GFR calc Af Amer: 58 mL/min — ABNORMAL LOW (ref >60)
GFR calc non Af Amer: 50 mL/min — ABNORMAL LOW (ref >60)
Glucose, Bld: 234 mg/dL — ABNORMAL HIGH (ref 70–99)
Potassium: 3.3 mmol/L — ABNORMAL LOW (ref 3.5–5.1)
Sodium: 137 mmol/L (ref 135–145)

## 2019-10-26 LAB — GLUCOSE, CAPILLARY
Glucose-Capillary: 221 mg/dL — ABNORMAL HIGH (ref 70–99)
Glucose-Capillary: 167 mg/dL — ABNORMAL HIGH (ref 70–99)
Glucose-Capillary: 257 mg/dL — ABNORMAL HIGH (ref 70–99)
Glucose-Capillary: 223 mg/dL — ABNORMAL HIGH (ref 70–99)
Glucose-Capillary: 183 mg/dL — ABNORMAL HIGH (ref 70–99)

## 2019-10-26 LAB — TYPE AND SCREEN
ABO/RH(D): O POS
Antibody Screen: NEGATIVE

## 2019-10-26 LAB — PROCALCITONIN: Procalcitonin: 0.53 ng/mL

## 2019-10-26 LAB — HEMOGLOBIN
Hemoglobin: 8.7 g/dL — ABNORMAL LOW (ref 12.0–15.0)
Hemoglobin: 9 g/dL — ABNORMAL LOW (ref 12.0–15.0)
Hemoglobin: 8.8 g/dL — ABNORMAL LOW (ref 12.0–15.0)

## 2019-10-26 MED ORDER — HYDROCORTISONE ACETATE 25 MG RE SUPP
25.0000 mg | Freq: Two times a day (BID) | RECTAL | Status: DC
  Administered 2019-10-26 – 2019-10-28 (×5): 25 mg via RECTAL
  Filled 2019-10-26 (×6): qty 1

## 2019-10-26 MED ORDER — POTASSIUM CHLORIDE CRYS ER 20 MEQ PO TBCR
20.0000 meq | EXTENDED_RELEASE_TABLET | Freq: Three times a day (TID) | ORAL | Status: DC
  Administered 2019-10-26 – 2019-10-28 (×6): 20 meq via ORAL
  Filled 2019-10-26 (×6): qty 1

## 2019-10-26 MED ORDER — IPRATROPIUM-ALBUTEROL 0.5-2.5 (3) MG/3ML IN SOLN
3.0000 mL | Freq: Three times a day (TID) | RESPIRATORY_TRACT | Status: DC
  Administered 2019-10-26 – 2019-10-28 (×7): 3 mL via RESPIRATORY_TRACT
  Filled 2019-10-26 (×7): qty 3

## 2019-10-26 MED ORDER — TRAZODONE HCL 100 MG PO TABS
100.0000 mg | ORAL_TABLET | Freq: Every day | ORAL | Status: DC
  Administered 2019-10-26 – 2019-10-27 (×2): 100 mg via ORAL
  Filled 2019-10-26 (×2): qty 1

## 2019-10-26 MED ORDER — GABAPENTIN 300 MG PO CAPS
300.0000 mg | ORAL_CAPSULE | Freq: Every day | ORAL | Status: DC
  Administered 2019-10-26 – 2019-10-27 (×2): 300 mg via ORAL
  Filled 2019-10-26 (×2): qty 1

## 2019-10-26 MED ORDER — HYDROXYZINE HCL 25 MG PO TABS: 25.0000 mg | ORAL_TABLET | Freq: Two times a day (BID) | ORAL | Status: DC | PRN

## 2019-10-26 MED ORDER — ALBUTEROL SULFATE (2.5 MG/3ML) 0.083% IN NEBU: 2.5000 mg | INHALATION_SOLUTION | RESPIRATORY_TRACT | Status: DC | PRN

## 2019-10-26 MED ORDER — POTASSIUM CHLORIDE CRYS ER 20 MEQ PO TBCR
40.0000 meq | EXTENDED_RELEASE_TABLET | Freq: Once | ORAL | Status: AC
  Administered 2019-10-26: 40 meq via ORAL
  Filled 2019-10-26: qty 2

## 2019-10-26 NOTE — Progress Notes (Signed)
Patients daughter called the nurses station and stated her mother has lost her wallet. This RN went into patients room, patient stated to grab her purse which was in lower cabinet and upon patient searching, her wallet was not there. Patient then stated it might be at her house and that her daughter was going to look for it there tomorrow. Agricultural consultant notified. Patients daughter will follow up tomorrow.

## 2019-10-26 NOTE — Progress Notes (Addendum)
Progress Note  Patient Name: Kristi [name] Date of Encounter: 10/26/2019  [facility] Cardiologist: Kate Sable, MD   Subjective   Feeling better.  Minimal bleeding today.  She cannot tell when she is in/out of atrial fibrillation.   Inpatient Medications    Scheduled Meds: . apixaban  5 mg Oral BID  . azithromycin  250 mg Oral Daily  . Chlorhexidine Gluconate Cloth  6 each Topical Daily  . cholecalciferol  1,000 Units Oral Daily  . cholestyramine  4 g Oral Daily  . donepezil  10 mg Oral Daily  . gabapentin  300 mg Oral q morning - 10a  . gabapentin  300 mg Oral QHS  . hydrocortisone  25 mg Rectal BID  . insulin aspart  0-9 Units Subcutaneous Q4H  . ipratropium-albuterol  3 mL Nebulization TID  . levothyroxine  200 mcg Oral Q0600  . liver oil-zinc oxide   Topical BID  . loratadine  10 mg Oral Daily  . metoprolol tartrate  12.5 mg Oral BID  . montelukast  10 mg Oral QHS  . multivitamin with minerals  1 tablet Oral Daily  . OLANZapine  2.5 mg Oral Daily  . pantoprazole  40 mg Oral Daily  . potassium chloride  20 mEq Oral TID  . rosuvastatin  40 mg Oral Daily  . sertraline  100 mg Oral BID  . torsemide  20 mg Oral Daily  . traZODone  100 mg Oral QHS  . vitamin B-12  1,000 mcg Oral Daily  . vitamin E  400 Units Oral Daily   Continuous Infusions: . cefTRIAXone (ROCEPHIN)  IV 2 g (10/26/19 0915)   PRN Meds: albuterol, hydrOXYzine, loperamide, meclizine, nitroGLYCERIN, ondansetron (ZOFRAN) IV, sodium chloride flush, traMADol   Vital Signs    Vitals:   10/26/19 0406 10/26/19 0726 10/26/19 0848 10/26/19 1109  BP: (!) 145/64 (!) 119/57  (!) 103/51  Pulse: 73 63 65 60  Resp:  18 17 19   Temp: 98.6 F (37 C) 98.7 F (37.1 C)  98.1 F (36.7 C)  TempSrc: Oral Oral  Oral  SpO2: 91% 94% 91% 96%  Weight: 86.8 kg     Height:        Intake/Output Summary (Last 24 hours) at 10/26/2019 1354 Last data filed at 10/26/2019 0900 Gross per 24 hour  Intake 640 ml    Output 3250 ml  Net -2610 ml   Last 3 Weights 10/26/2019 10/23/2019 10/23/2019  Weight (lbs) 191 lb 4.8 oz 180 lb 1.6 oz 195 lb 15.8 oz  Weight (kg) 86.773 kg 81.693 kg 88.9 kg      Telemetry    AP-VS - Personally Reviewed  ECG    n/a - Personally Reviewed  Physical Exam   VS:  BP (!) 103/51 (BP Location: Right Arm)   Pulse 60   Temp 98.1 F (36.7 C) (Oral)   Resp 19   Ht 5\' 6"  (1.676 m)   Wt 86.8 kg   SpO2 96%   BMI 30.88 kg/m  , BMI Body mass index is 30.88 kg/m. GENERAL:  Well appearing HEENT: Pupils equal round and reactive, fundi not visualized, oral mucosa unremarkable NECK:  No jugular venous distention, waveform within normal limits, carotid upstroke brisk and symmetric, no bruits, no thyromegaly LYMPHATICS:  No cervical adenopathy LUNGS:  Clear to auscultation bilaterally HEART:  RRR.  PMI not displaced or sustained,S1 and S2 within normal limits, no S3, no S4, no clicks, no rubs, II/VI sysotolic murmur at the  LUSB ABD:  Flat, positive bowel sounds normal in frequency in pitch, no bruits, no rebound, no guarding, no midline pulsatile mass, no hepatomegaly, no splenomegaly EXT:  2 plus pulses throughout, no edema, no cyanosis no clubbing SKIN:  No rashes no nodules NEURO:  Cranial nerves II through XII grossly intact, motor grossly intact throughout PSYCH:  Cognitively intact, oriented to person place and time   Labs    High Sensitivity Troponin:   Recent Labs  Lab 10/08/19 1540  TROPONINIHS 15      Chemistry Recent Labs  Lab 10/23/19 1109 10/23/19 1811 10/24/19 0940 10/25/19 0600 10/26/19 0339  NA 128*   < > 137 135 137  K 2.3*   < > 3.4* 3.4* 3.3*  CL 81*   < > 92* 96* 94*  CO2 32   < > 34* 31 30  GLUCOSE 594*   < > 179* 188* 234*  BUN 22   < > 19 17 12   CREATININE 1.11*   < > 1.19* 1.02* 1.05*  CALCIUM 8.3*   < > 9.3 9.0 8.8*  PROT 7.3  --   --   --   --   ALBUMIN 3.7  --   --   --   --   AST 52*  --   --   --   --   ALT 50*  --   --   --    --   ALKPHOS 118  --   --   --   --   BILITOT 0.8  --   --   --   --   GFRNONAA 47*   < > 43* 52* 50*  GFRAA 54*   < > 50* 60* 58*  ANIONGAP 15   < > 11 8 13    < > = values in this interval not displayed.     Hematology Recent Labs  Lab 10/23/19 1109 10/23/19 1109 10/25/19 0903 10/25/19 1255 10/25/19 2100 10/26/19 0339 10/26/19 1317  WBC 9.9  --  6.7  --   --   --   --   RBC 3.43*  --  3.25*  --   --   --   --   HGB 10.4*   < > 9.9*   < > 8.1* 8.7* 9.0*  HCT 31.4*  --  30.7*  --   --   --   --   MCV 91.5  --  94.5  --   --   --   --   MCH 30.3  --  30.5  --   --   --   --   MCHC 33.1  --  32.2  --   --   --   --   RDW 14.6  --  15.3  --   --   --   --   PLT 122*  --  131*  --   --   --   --    < > = values in this interval not displayed.    BNPNo results for input(s): BNP, PROBNP in the last 168 hours.   DDimer No results for input(s): DDIMER in the last 168 hours.   Radiology    No results found.  Cardiac Studies   Echo 08/12/19: IMPRESSIONS  1. Left ventricular ejection fraction, by estimation, is 50 to 55%. The  left ventricle has low normal function. Left ventricular endocardial  border not optimally defined to evaluate regional wall motion. There is  mild left ventricular hypertrophy. Left  ventricular diastolic parameters are indeterminate.  2. Right ventricular systolic function is normal. The right ventricular  size is normal. There is moderately elevated pulmonary artery systolic  pressure.  3. Left atrial size was severely dilated.  4. Right atrial size was mildly dilated.  5. The mitral valve is normal in structure. Moderate mitral valve  regurgitation. No evidence of mitral stenosis.  6. Tricuspid valve regurgitation is severe.  7. The aortic valve is normal in structure. Aortic valve regurgitation is  trivial. Mild aortic valve sclerosis is present, with no evidence of  aortic valve stenosis.  8. Moderately dilated pulmonary artery.  9.  The inferior vena cava is normal in size with <50% respiratory  variability, suggesting right atrial pressure of 8 mmHg.    Patient Profile     79 y.o. female with CAD s/p CABG and PCI, paroxysmal atrial fibrillation/flutter, pulmonary hypertension, sick sinus syndrome status post [employer] pacemaker, diabetes, hypertension, hyperlipidemia, and dementia admitted with HCAP, atrial fibrillation with rapid ventricular response and hematochezia.  Assessment & Plan    # Persistent atrial fibrillation:  Ms. Herbst continues to maintain sinus rhythm after cardioversion on 8/5.  Her heart rates are well-controlled.  Continue Eliquis as her blood counts remained stable.  Amiodarone has been discontinued.  # CAD s/p CABG/PCI: # Hyperlipidemia. Asymtpomatic.  Continue Eliquis and rosuvastatin.    # Essential hypertension:  Blood pressure stable metoprolol.  # Pulmonary hypertension:  She is euvolemic.  Continue torsemide.  She was net -2 L yesterday.    [facility] will sign off.   Medication Recommendations:  none Other recommendations (labs, testing, etc):  none Follow up as an outpatient:  We will arrange  For questions or updates, please contact [place] Please consult [email or web address] for contact info under        Signed, Skeet Latch, MD  10/26/2019, 1:54 PM

## 2019-10-26 NOTE — Progress Notes (Signed)
Patient ID: Kristi [name], female   DOB: October 31, 1940, 79 y.o.   MRN: [id number] [facility] PROGRESS NOTE  Jackelin Correia WER:[id number] DOB: 09/12/1940 DOA: 10/23/2019 PCP: Lorelee Market, MD  HPI/Subjective: I was called to see the patient for rectal bleeding.  By the time I got there the bowel movement was already flushed.  In speaking with the aid there was only a small amount of blood on the stool.  The patient has numerous complaints of not feeling well.  Not sleeping at night.  Confused at times.  She states she is short of breath and nauseous.  Objective: Vitals:   10/26/19 0848 10/26/19 1109  BP:  (!) 103/51  Pulse: 65 60  Resp: 17 19  Temp:  98.1 F (36.7 C)  SpO2: 91% 96%    Intake/Output Summary (Last 24 hours) at 10/26/2019 1334 Last data filed at 10/26/2019 0900 Gross per 24 hour  Intake 640 ml  Output 3250 ml  Net -2610 ml   Filed Weights   10/23/19 1111 10/23/19 1803 10/26/19 0406  Weight: 88.9 kg 81.7 kg 86.8 kg    ROS: Review of Systems  Respiratory: Negative for shortness of breath.   Cardiovascular: Negative for chest pain.  Gastrointestinal: Negative for abdominal pain.   Exam: Physical Exam HENT:     Nose: No mucosal edema.     Mouth/Throat:     Pharynx: No oropharyngeal exudate.  Eyes:     General: Lids are normal.     Conjunctiva/sclera: Conjunctivae normal.     Pupils: Pupils are equal, round, and reactive to light.  Cardiovascular:     Rate and Rhythm: Normal rate and regular rhythm.     Heart sounds: Normal heart sounds, S1 normal and S2 normal.  Pulmonary:     Breath sounds: No decreased breath sounds, wheezing or rhonchi.  Abdominal:     Palpations: Abdomen is soft.     Tenderness: There is no abdominal tenderness.  Musculoskeletal:     Right ankle: Swelling present.     Left ankle: Swelling present.  Skin:    General: Skin is warm.     Findings: No rash.  Neurological:     Mental Status: She is alert and oriented to person,  place, and time.       Data Reviewed: Basic Metabolic Panel: Recent Labs  Lab 10/23/19 1109 10/23/19 1811 10/23/19 2144 10/24/19 0707 10/24/19 0940 10/25/19 0600 10/26/19 0339  NA 128*  --   --  137 137 135 137  K 2.3*   < > 3.4* 3.4* 3.4* 3.4* 3.3*  CL 81*  --   --  92* 92* 96* 94*  CO2 32  --   --  36* 34* 31 30  GLUCOSE 594*  --   --  142* 179* 188* 234*  BUN 22  --   --  19 19 17 12   CREATININE 1.11*  --   --  1.07* 1.19* 1.02* 1.05*  CALCIUM 8.3*  --   --  9.1 9.3 9.0 8.8*  MG 1.9  --   --  2.0  --  2.0  --    < > = values in this interval not displayed.   Liver Function Tests: Recent Labs  Lab 10/23/19 1109  AST 52*  ALT 50*  ALKPHOS 118  BILITOT 0.8  PROT 7.3  ALBUMIN 3.7   CBC: Recent Labs  Lab 10/23/19 1109 10/23/19 1109 10/25/19 0903 10/25/19 1255 10/25/19 2100 10/26/19 0339 10/26/19 1317  WBC  9.9  --  6.7  --   --   --   --   NEUTROABS 8.2*  --   --   --   --   --   --   HGB 10.4*   < > 9.9* 8.9* 8.1* 8.7* 9.0*  HCT 31.4*  --  30.7*  --   --   --   --   MCV 91.5  --  94.5  --   --   --   --   PLT 122*  --  131*  --   --   --   --    < > = values in this interval not displayed.   BNP (last 3 results) Recent Labs    09/27/19 0534 09/28/19 0607 09/29/19 0808  BNP 101.6* 486.7* 359.9*    CBG: Recent Labs  Lab 10/25/19 2101 10/25/19 2330 10/26/19 0408 10/26/19 0811 10/26/19 1112  GLUCAP 264* 241* 221* 167* 257*    Recent Results (from the past 240 hour(s))  SARS Coronavirus 2 by RT PCR (hospital order, performed in [facility] hospital lab) Nasopharyngeal Nasopharyngeal Swab     Status: None   Collection Time: 10/23/19 11:09 AM   Specimen: Nasopharyngeal Swab  Result Value Ref Range Status   SARS Coronavirus 2 NEGATIVE NEGATIVE Final    Comment: (NOTE) SARS-CoV-2 target nucleic acids are NOT DETECTED.  The SARS-CoV-2 RNA is generally detectable in upper and lower respiratory specimens during the acute phase of infection. The  lowest concentration of SARS-CoV-2 viral copies this assay can detect is 250 copies / mL. A negative result does not preclude SARS-CoV-2 infection and should not be used as the sole basis for treatment or other patient management decisions.  A negative result may occur with improper specimen collection / handling, submission of specimen other than nasopharyngeal swab, presence of viral mutation(s) within the areas targeted by this assay, and inadequate number of viral copies (<250 copies / mL). A negative result must be combined with clinical observations, patient history, and epidemiological information.  Fact Sheet for Patients:   StrictlyIdeas.no  Fact Sheet for Healthcare Providers: BankingDealers.co.za  This test is not yet approved or  cleared by the [place] FDA and has been authorized for detection and/or diagnosis of SARS-CoV-2 by FDA under an Emergency Use Authorization (EUA).  This EUA will remain in effect (meaning this test can be used) for the duration of the COVID-19 declaration under Section 564(b)(1) of the Act, 21 U.S.C. section 360bbb-3(b)(1), unless the authorization is terminated or revoked sooner.  Performed at [facility], [address] [place], [place] [address]   Culture, blood (Routine X 2) w Reflex to ID Panel     Status: None (Preliminary result)   Collection Time: 10/23/19  1:57 PM   Specimen: BLOOD  Result Value Ref Range Status   Specimen Description BLOOD RIGHT ANTECUBITAL  Final   Special Requests   Final    BOTTLES DRAWN AEROBIC AND ANAEROBIC Blood Culture results may not be optimal due to an inadequate volume of blood received in culture bottles   Culture   Final    NO GROWTH 3 DAYS Performed at [facility], [address] [place], [place] [address]    Report Status PENDING  Incomplete  Culture, blood (Routine X 2) w Reflex to ID Panel     Status: None (Preliminary  result)   Collection Time: 10/23/19  6:10 PM   Specimen: BLOOD  Result Value Ref Range  Status   Specimen Description BLOOD RIGHT ANTECUBITAL  Final   Special Requests   Final    BOTTLES DRAWN AEROBIC AND ANAEROBIC Blood Culture adequate volume   Culture   Final    NO GROWTH 3 DAYS Performed at [facility], [address], [place], [place] [address]    Report Status PENDING  Incomplete  C Difficile Quick Screen w PCR reflex     Status: None   Collection Time: 10/25/19  9:13 AM   Specimen: STOOL  Result Value Ref Range Status   C Diff antigen NEGATIVE NEGATIVE Final   C Diff toxin NEGATIVE NEGATIVE Final   C Diff interpretation No C. difficile detected.  Final    Comment: Performed at [facility], [address] [place], [place] [address]  Gastrointestinal Panel by PCR , Stool     Status: None   Collection Time: 10/25/19  9:13 AM   Specimen: Stool  Result Value Ref Range Status   Campylobacter species NOT DETECTED NOT DETECTED Final   Plesimonas shigelloides NOT DETECTED NOT DETECTED Final   Salmonella species NOT DETECTED NOT DETECTED Final   Yersinia enterocolitica NOT DETECTED NOT DETECTED Final   Vibrio species NOT DETECTED NOT DETECTED Final   Vibrio cholerae NOT DETECTED NOT DETECTED Final   Enteroaggregative E coli (EAEC) NOT DETECTED NOT DETECTED Final   Enteropathogenic E coli (EPEC) NOT DETECTED NOT DETECTED Final   Enterotoxigenic E coli (ETEC) NOT DETECTED NOT DETECTED Final   Shiga like toxin producing E coli (STEC) NOT DETECTED NOT DETECTED Final   Shigella/Enteroinvasive E coli (EIEC) NOT DETECTED NOT DETECTED Final   Cryptosporidium NOT DETECTED NOT DETECTED Final   Cyclospora cayetanensis NOT DETECTED NOT DETECTED Final   Entamoeba histolytica NOT DETECTED NOT DETECTED Final   Giardia lamblia NOT DETECTED NOT DETECTED Final   Adenovirus F40/41 NOT DETECTED NOT DETECTED Final   Astrovirus NOT DETECTED NOT DETECTED Final   Norovirus GI/GII  NOT DETECTED NOT DETECTED Final   Rotavirus A NOT DETECTED NOT DETECTED Final   Sapovirus (I, II, IV, and V) NOT DETECTED NOT DETECTED Final    Comment: Performed at [facility], [address] [place], [place] [address]      Scheduled Meds: . apixaban  5 mg Oral BID  . azithromycin  250 mg Oral Daily  . Chlorhexidine Gluconate Cloth  6 each Topical Daily  . cholecalciferol  1,000 Units Oral Daily  . cholestyramine  4 g Oral Daily  . donepezil  10 mg Oral Daily  . gabapentin  300 mg Oral q morning - 10a  . gabapentin  600 mg Oral QHS  . hydrocortisone  25 mg Rectal BID  . hydrOXYzine  25 mg Oral BID  . insulin aspart  0-9 Units Subcutaneous Q4H  . ipratropium-albuterol  3 mL Nebulization TID  . levothyroxine  200 mcg Oral Q0600  . liver oil-zinc oxide   Topical BID  . loratadine  10 mg Oral Daily  . metoprolol tartrate  12.5 mg Oral BID  . montelukast  10 mg Oral QHS  . multivitamin with minerals  1 tablet Oral Daily  . OLANZapine  2.5 mg Oral Daily  . pantoprazole  40 mg Oral Daily  . potassium chloride  20 mEq Oral BID  . rosuvastatin  40 mg Oral Daily  . sertraline  100 mg Oral BID  . torsemide  20 mg Oral Daily  . traZODone  100 mg Oral QHS  . vitamin  B-12  1,000 mcg Oral Daily  . vitamin E  400 Units Oral Daily   Continuous Infusions: . cefTRIAXone (ROCEPHIN)  IV 2 g (10/26/19 0915)    Assessment/Plan:  1. Acute delirium and insomnia.  Increase trazodone to 100 mg at night.  Decrease dose of gabapentin at night.  Make Vistaril as needed. 2. Persistent atrial fibrillation that was cardioverted back into normal sinus rhythm on Eliquis.  Continue metoprolol. 3. Bright red blood per rectum.  Stool studies negative.  As needed Imodium.  Need to keep on Eliquis after anticoagulation.  Will give a trial of Anusol suppositories.  Hemoglobin dipped as low as 8.1 but up to 9.0 currently. 4. Lobar pneumonia on Rocephin and Zithromax 5. Hypokalemia continue oral  potassium 6. Hyponatremia improved with IV fluids 7. Hypotension has resolved.  Cardiology placed on metoprolol and patient now back on low-dose torsemide. 8. Hyperlipidemia on Crestor 9. Depression on Zoloft 10. Hypothyroidism unspecified on levothyroxine     Code Status:     Code Status Orders  (From admission, onward)         Start     Ordered   10/23/19 1359  Full code  Continuous        10/23/19 1401        Code Status History    Date Active Date Inactive Code Status Order ID Comments User Context   09/25/2019 1138 09/30/2019 2324 Full Code [id number]  Collier Bullock, MD ED   09/05/2019 1035 09/05/2019 1658 Full Code [id number]  [name], MD Inpatient   09/05/2019 1034 09/05/2019 1035 Full Code [id number]  [name], MD Inpatient   11/20/2018 2158 11/21/2018 2040 DNR [id number]  Vaughan Basta, MD Inpatient   04/11/2017 0602 04/18/2017 1834 Full Code [id number]  Saundra Shelling, MD Inpatient   12/24/2016 0640 12/26/2016 2020 Full Code [id number]  Harrie Foreman, MD ED   11/17/2016 0450 11/17/2016 1954 Full Code [id number]  Saundra Shelling, MD Inpatient   09/29/2016 2002 09/30/2016 1605 Full Code [id number]  Epifanio Lesches, MD ED   Advance Care Planning Activity    Advance Directive Documentation     Most Recent Value  Type of Advance Directive Healthcare Power of Attorney  Pre-existing out of facility DNR order (yellow form or pink MOST form) --  "MOST" Form in Place? --     Family Communication: Spoke with daughter on the phone.  Since the patient lives alone may end up needing more help at home. Disposition Plan: Status is: Inpatient  Dispo: The patient is from: Home alone              Anticipated d/c is to: Home with home health              Anticipated d/c date is: Dependent on when acute delirium clears and when the rectal bleeding stops.              Patient currently having acute delirium and confusion here in the hospital.  We will try to get the  patient sleeping tonight and reset time clock.  Patient also having rectal bleeding and needs to be on the Eliquis after cardioversion.  Consultants:  Cardiology  Procedures:  Cardioversion  Antibiotics:  Rocephin  Zithromax  Time spent: 27 minutes  [place]

## 2019-10-26 NOTE — Consult Note (Signed)
[place] for Electrolyte Monitoring and Replacement   Recent Labs: Potassium (mmol/L)  Date Value  10/26/2019 3.3 (L)  02/19/2014 3.5   Magnesium (mg/dL)  Date Value  10/25/2019 2.0   Calcium (mg/dL)  Date Value  10/26/2019 8.8 (L)   Calcium, Total (mg/dL)  Date Value  02/19/2014 9.1   Albumin (g/dL)  Date Value  10/23/2019 3.7   Sodium (mmol/L)  Date Value  10/26/2019 137  08/13/2019 142  02/19/2014 143   Corrected Ca: 9.04 mg/dL  Assessment: Patient is an 79 y/o F with medical history including CAD, CHF, diabetes, atrial fibrillation, hx of CVA who presented to the ED after falling at home. Patient is receiving KCl 20 mEq BID scheduled. Patient's K is slightly lower after receiving 40 mEq of oral KCl on 8/6 (additional 40 mEq oral KCl not administred).   Goal of Therapy:  Potassium 4.0 - 5.1 mmol/L Magnesium 2.0 - 2.4 mg/dL All Other Electrolytes WNL  Plan:   KCl 40 mEq PO x 1  F/u with electrolytes in the AM  Dallie Piles, PharmD Clinical Pharmacist 10/26/2019 7:27 AM

## 2019-10-27 LAB — BASIC METABOLIC PANEL
Anion gap: 9 (ref 5–15)
BUN: 12 mg/dL (ref 8–23)
CO2: 31 mmol/L (ref 22–32)
Calcium: 8.7 mg/dL — ABNORMAL LOW (ref 8.9–10.3)
Chloride: 99 mmol/L (ref 98–111)
Creatinine, Ser: 0.98 mg/dL (ref 0.44–1.00)
GFR calc Af Amer: >60 mL/min (ref >60)
GFR calc non Af Amer: 54 mL/min — ABNORMAL LOW (ref >60)
Glucose, Bld: 239 mg/dL — ABNORMAL HIGH (ref 70–99)
Potassium: 3.8 mmol/L (ref 3.5–5.1)
Sodium: 139 mmol/L (ref 135–145)

## 2019-10-27 LAB — GLUCOSE, CAPILLARY
Glucose-Capillary: 230 mg/dL — ABNORMAL HIGH (ref 70–99)
Glucose-Capillary: 242 mg/dL — ABNORMAL HIGH (ref 70–99)
Glucose-Capillary: 293 mg/dL — ABNORMAL HIGH (ref 70–99)
Glucose-Capillary: 349 mg/dL — ABNORMAL HIGH (ref 70–99)
Glucose-Capillary: 351 mg/dL — ABNORMAL HIGH (ref 70–99)
Glucose-Capillary: 363 mg/dL — ABNORMAL HIGH (ref 70–99)

## 2019-10-27 LAB — HEMOGLOBIN: Hemoglobin: 8.8 g/dL — ABNORMAL LOW (ref 12.0–15.0)

## 2019-10-27 LAB — MAGNESIUM: Magnesium: 2.1 mg/dL (ref 1.7–2.4)

## 2019-10-27 MED ORDER — BUDESONIDE 0.5 MG/2ML IN SUSP
0.5000 mg | Freq: Two times a day (BID) | RESPIRATORY_TRACT | Status: DC
  Administered 2019-10-27 – 2019-10-28 (×2): 0.5 mg via RESPIRATORY_TRACT
  Filled 2019-10-27 (×2): qty 2

## 2019-10-27 MED ORDER — SODIUM CHLORIDE 0.9 % IV SOLN
400.0000 mg | Freq: Once | INTRAVENOUS | Status: AC
  Administered 2019-10-27: 400 mg via INTRAVENOUS
  Filled 2019-10-27: qty 20

## 2019-10-27 MED ORDER — GUAIFENESIN 100 MG/5ML PO SOLN
5.0000 mL | ORAL | Status: DC | PRN
  Administered 2019-10-27: 100 mg via ORAL
  Filled 2019-10-27 (×3): qty 5

## 2019-10-27 MED ORDER — METOPROLOL SUCCINATE ER 25 MG PO TB24
12.5000 mg | ORAL_TABLET | Freq: Every day | ORAL | Status: DC
  Administered 2019-10-27: 12.5 mg via ORAL
  Filled 2019-10-27 (×2): qty 1

## 2019-10-27 NOTE — Progress Notes (Signed)
Notified by patient and family that wallet is found, was at her home.

## 2019-10-27 NOTE — Consult Note (Signed)
[place] for Electrolyte Monitoring and Replacement   Recent Labs: Potassium (mmol/L)  Date Value  10/27/2019 3.8  02/19/2014 3.5   Magnesium (mg/dL)  Date Value  10/27/2019 2.1   Calcium (mg/dL)  Date Value  10/27/2019 8.7 (L)   Calcium, Total (mg/dL)  Date Value  02/19/2014 9.1   Albumin (g/dL)  Date Value  10/23/2019 3.7   Sodium (mmol/L)  Date Value  10/27/2019 139  08/13/2019 142  02/19/2014 143   Corrected Ca: 8.94 mg/dL  Assessment: Patient is an 79 y/o F with medical history including CAD, CHF, diabetes, atrial fibrillation, hx of CVA who presented to the ED after falling at home. Patient is receiving KCl 20 mEq TID scheduled.   Goal of Therapy:  Potassium 4.0 - 5.1 mmol/L Magnesium 2.0 - 2.4 mg/dL All Other Electrolytes WNL  Plan:   KCl 20 mEq TID per Dr Leslye Peer  F/u with electrolytes in the AM  Dallie Piles, PharmD Clinical Pharmacist 10/27/2019 12:54 PM

## 2019-10-27 NOTE — Progress Notes (Signed)
Patient ID: Kristi [name], female   DOB: 29-Dec-1940, 79 y.o.   MRN: [id number] [facility] PROGRESS NOTE  Kristi [name] SWN:[id number] DOB: 1940/03/25 DOA: 10/23/2019 PCP: Lorelee Market, MD  HPI/Subjective: Patient complains of some chest congestion but otherwise feeling better than she has been.  She finally got some sleep last night.  Not as confused.  Objective: Vitals:   10/27/19 1158 10/27/19 1210  BP: (!) 79/70 (!) 101/50  Pulse: 62 (!) 59  Resp: 19   Temp: 98.1 F (36.7 C)   SpO2: 100%     Intake/Output Summary (Last 24 hours) at 10/27/2019 1459 Last data filed at 10/27/2019 0940 Gross per 24 hour  Intake 240 ml  Output 200 ml  Net 40 ml   Filed Weights   10/23/19 1803 10/26/19 0406 10/27/19 0504  Weight: 81.7 kg 86.8 kg 86.3 kg    ROS: Review of Systems  Respiratory: Positive for cough. Negative for shortness of breath.   Cardiovascular: Negative for chest pain.  Gastrointestinal: Negative for abdominal pain.   Exam: Physical Exam HENT:     Nose: No mucosal edema.     Mouth/Throat:     Pharynx: No oropharyngeal exudate.  Eyes:     General: Lids are normal.     Conjunctiva/sclera: Conjunctivae normal.     Pupils: Pupils are equal, round, and reactive to light.  Cardiovascular:     Rate and Rhythm: Normal rate and regular rhythm.     Heart sounds: Normal heart sounds, S1 normal and S2 normal.  Pulmonary:     Breath sounds: Examination of the right-lower field reveals decreased breath sounds. Examination of the left-lower field reveals decreased breath sounds. Decreased breath sounds present. No wheezing, rhonchi or rales.  Abdominal:     Palpations: Abdomen is soft.     Tenderness: There is no abdominal tenderness.  Musculoskeletal:     Right ankle: Swelling present.     Left ankle: Swelling present.  Skin:    General: Skin is warm.     Findings: No rash.  Neurological:     Mental Status: She is alert.     Comments: Answers questions appropriately  today.  Moves all extremities on her own.       Data Reviewed: Basic Metabolic Panel: Recent Labs  Lab 10/23/19 1109 10/23/19 1811 10/24/19 0707 10/24/19 0940 10/25/19 0600 10/26/19 0339 10/27/19 0724  NA 128*  --  137 137 135 137 139  K 2.3*   < > 3.4* 3.4* 3.4* 3.3* 3.8  CL 81*  --  92* 92* 96* 94* 99  CO2 32  --  36* 34* 31 30 31   GLUCOSE 594*  --  142* 179* 188* 234* 239*  BUN 22  --  19 19 17 12 12   CREATININE 1.11*  --  1.07* 1.19* 1.02* 1.05* 0.98  CALCIUM 8.3*  --  9.1 9.3 9.0 8.8* 8.7*  MG 1.9  --  2.0  --  2.0  --  2.1   < > = values in this interval not displayed.   Liver Function Tests: Recent Labs  Lab 10/23/19 1109  AST 52*  ALT 50*  ALKPHOS 118  BILITOT 0.8  PROT 7.3  ALBUMIN 3.7   CBC: Recent Labs  Lab 10/23/19 1109 10/23/19 1109 10/25/19 0903 10/25/19 1255 10/25/19 2100 10/26/19 0339 10/26/19 1317 10/26/19 2114 10/27/19 0724  WBC 9.9  --  6.7  --   --   --   --   --   --  NEUTROABS 8.2*  --   --   --   --   --   --   --   --   HGB 10.4*   < > 9.9*   < > 8.1* 8.7* 9.0* 8.8* 8.8*  HCT 31.4*  --  30.7*  --   --   --   --   --   --   MCV 91.5  --  94.5  --   --   --   --   --   --   PLT 122*  --  131*  --   --   --   --   --   --    < > = values in this interval not displayed.   BNP (last 3 results) Recent Labs    09/27/19 0534 09/28/19 0607 09/29/19 0808  BNP 101.6* 486.7* 359.9*     CBG: Recent Labs  Lab 10/26/19 1938 10/27/19 0032 10/27/19 0547 10/27/19 0750 10/27/19 1228  GLUCAP 183* 349* 230* 242* 363*    Recent Results (from the past 240 hour(s))  SARS Coronavirus 2 by RT PCR (hospital order, performed in [facility] hospital lab) Nasopharyngeal Nasopharyngeal Swab     Status: None   Collection Time: 10/23/19 11:09 AM   Specimen: Nasopharyngeal Swab  Result Value Ref Range Status   SARS Coronavirus 2 NEGATIVE NEGATIVE Final    Comment: (NOTE) SARS-CoV-2 target nucleic acids are NOT DETECTED.  The SARS-CoV-2  RNA is generally detectable in upper and lower respiratory specimens during the acute phase of infection. The lowest concentration of SARS-CoV-2 viral copies this assay can detect is 250 copies / mL. A negative result does not preclude SARS-CoV-2 infection and should not be used as the sole basis for treatment or other patient management decisions.  A negative result may occur with improper specimen collection / handling, submission of specimen other than nasopharyngeal swab, presence of viral mutation(s) within the areas targeted by this assay, and inadequate number of viral copies (<250 copies / mL). A negative result must be combined with clinical observations, patient history, and epidemiological information.  Fact Sheet for Patients:   StrictlyIdeas.no  Fact Sheet for Healthcare Providers: BankingDealers.co.za  This test is not yet approved or  cleared by the [place] FDA and has been authorized for detection and/or diagnosis of SARS-CoV-2 by FDA under an Emergency Use Authorization (EUA).  This EUA will remain in effect (meaning this test can be used) for the duration of the COVID-19 declaration under Section 564(b)(1) of the Act, 21 U.S.C. section 360bbb-3(b)(1), unless the authorization is terminated or revoked sooner.  Performed at [facility], [address] [place], [place] [address]   Culture, blood (Routine X 2) w Reflex to ID Panel     Status: None (Preliminary result)   Collection Time: 10/23/19  1:57 PM   Specimen: BLOOD  Result Value Ref Range Status   Specimen Description BLOOD RIGHT ANTECUBITAL  Final   Special Requests   Final    BOTTLES DRAWN AEROBIC AND ANAEROBIC Blood Culture results may not be optimal due to an inadequate volume of blood received in culture bottles   Culture   Final    NO GROWTH 4 DAYS Performed at [facility], [address] [place], [place] [address]    Report  Status PENDING  Incomplete  Culture, blood (Routine X 2) w Reflex to ID Panel     Status: None (Preliminary result)   Collection Time: 10/23/19  6:10 PM   Specimen: BLOOD  Result Value Ref Range Status   Specimen Description BLOOD RIGHT ANTECUBITAL  Final   Special Requests   Final    BOTTLES DRAWN AEROBIC AND ANAEROBIC Blood Culture adequate volume   Culture   Final    NO GROWTH 4 DAYS Performed at [facility], [address] [place], [place] [address]    Report Status PENDING  Incomplete  C Difficile Quick Screen w PCR reflex     Status: None   Collection Time: 10/25/19  9:13 AM   Specimen: STOOL  Result Value Ref Range Status   C Diff antigen NEGATIVE NEGATIVE Final   C Diff toxin NEGATIVE NEGATIVE Final   C Diff interpretation No C. difficile detected.  Final    Comment: Performed at [facility], [address] [place], [place] [address]  Gastrointestinal Panel by PCR , Stool     Status: None   Collection Time: 10/25/19  9:13 AM   Specimen: Stool  Result Value Ref Range Status   Campylobacter species NOT DETECTED NOT DETECTED Final   Plesimonas shigelloides NOT DETECTED NOT DETECTED Final   Salmonella species NOT DETECTED NOT DETECTED Final   Yersinia enterocolitica NOT DETECTED NOT DETECTED Final   Vibrio species NOT DETECTED NOT DETECTED Final   Vibrio cholerae NOT DETECTED NOT DETECTED Final   Enteroaggregative E coli (EAEC) NOT DETECTED NOT DETECTED Final   Enteropathogenic E coli (EPEC) NOT DETECTED NOT DETECTED Final   Enterotoxigenic E coli (ETEC) NOT DETECTED NOT DETECTED Final   Shiga like toxin producing E coli (STEC) NOT DETECTED NOT DETECTED Final   Shigella/Enteroinvasive E coli (EIEC) NOT DETECTED NOT DETECTED Final   Cryptosporidium NOT DETECTED NOT DETECTED Final   Cyclospora cayetanensis NOT DETECTED NOT DETECTED Final   Entamoeba histolytica NOT DETECTED NOT DETECTED Final   Giardia lamblia NOT DETECTED NOT DETECTED Final    Adenovirus F40/41 NOT DETECTED NOT DETECTED Final   Astrovirus NOT DETECTED NOT DETECTED Final   Norovirus GI/GII NOT DETECTED NOT DETECTED Final   Rotavirus A NOT DETECTED NOT DETECTED Final   Sapovirus (I, II, IV, and V) NOT DETECTED NOT DETECTED Final    Comment: Performed at [facility], [address] [place], [place] [address]     Scheduled Meds: . apixaban  5 mg Oral BID  . azithromycin  250 mg Oral Daily  . budesonide (PULMICORT) nebulizer solution  0.5 mg Nebulization BID  . Chlorhexidine Gluconate Cloth  6 each Topical Daily  . cholecalciferol  1,000 Units Oral Daily  . cholestyramine  4 g Oral Daily  . donepezil  10 mg Oral Daily  . gabapentin  300 mg Oral q morning - 10a  . gabapentin  300 mg Oral QHS  . hydrocortisone  25 mg Rectal BID  . insulin aspart  0-9 Units Subcutaneous Q4H  . ipratropium-albuterol  3 mL Nebulization TID  . levothyroxine  200 mcg Oral Q0600  . liver oil-zinc oxide   Topical BID  . loratadine  10 mg Oral Daily  . metoprolol tartrate  12.5 mg Oral BID  . montelukast  10 mg Oral QHS  . multivitamin with minerals  1 tablet Oral Daily  . OLANZapine  2.5 mg Oral Daily  . pantoprazole  40 mg Oral Daily  . potassium chloride  20 mEq Oral TID  . rosuvastatin  40 mg Oral Daily  . sertraline  100 mg Oral BID  . torsemide  20 mg  Oral Daily  . traZODone  100 mg Oral QHS  . vitamin B-12  1,000 mcg Oral Daily  . vitamin E  400 Units Oral Daily   Continuous Infusions: . iron sucrose      Assessment/Plan:  1. Acute delirium and insomnia.  This has improved today with getting the patient sleeping last night with trazodone. 2. Persistent atrial fibrillation that was converted back to normal sinus rhythm on Eliquis.  Change metoprolol over to Toprol-XL at night since blood pressure little bit on the lower side. 3. Bright red blood per rectum.  Stool studies negative.  As needed Imodium.  Need to keep on Eliquis after anticoagulation.  Current  hemoglobin 8.8.  We will give IV iron today.  I did give Anusol suppositories probably with hemorrhoidal bleeding.  A bowel movement today did not have any blood in it. 4. Lobar pneumonia.  Finished Rocephin and on Zithromax.  Patient coughing with deep breath add budesonide nebulizer solution to DuoNeb. 5. Hyponatremia improved 6. Hypotension has resolved 7. Hyperlipidemia on Crestor 8. Depression on Zoloft 9. Hypothyroidism unspecified on levothyroxine    Code Status:     Code Status Orders  (From admission, onward)         Start     Ordered   10/23/19 1359  Full code  Continuous        10/23/19 1401        Code Status History    Date Active Date Inactive Code Status Order ID Comments User Context   09/25/2019 1138 09/30/2019 2324 Full Code [id number]  Collier Bullock, MD ED   09/05/2019 1035 09/05/2019 1658 Full Code [id number]  [name], MD Inpatient   09/05/2019 1034 09/05/2019 1035 Full Code [id number]  [name], MD Inpatient   11/20/2018 2158 11/21/2018 2040 DNR [id number]  Vaughan Basta, MD Inpatient   04/11/2017 0602 04/18/2017 1834 Full Code [id number]  Saundra Shelling, MD Inpatient   12/24/2016 0640 12/26/2016 2020 Full Code [id number]  Harrie Foreman, MD ED   11/17/2016 0450 11/17/2016 1954 Full Code [id number]  Saundra Shelling, MD Inpatient   09/29/2016 2002 09/30/2016 1605 Full Code [id number]  Epifanio Lesches, MD ED   Advance Care Planning Activity    Advance Directive Documentation     Most Recent Value  Type of Advance Directive Healthcare Power of Attorney  Pre-existing out of facility DNR order (yellow form or pink MOST form) --  "MOST" Form in Place? --     Family Communication: Spoke with daughter on the phone Disposition Plan: Status is: Inpatient   Dispo: The patient is from: Home              Anticipated d/c is to: Home              Anticipated d/c date is: Potential 10/28/2019 depending on how she is doing tomorrow morning               Patient currently being treated for acute delirium and insomnia which seems better.  Was cardioverted with atrial fibrillation into normal sinus rhythm.  Patient's last bowel movement did not have any bright red blood.  Continue to monitor.  Check hemoglobin again tomorrow morning  Consultants:  Cardiology signed off  Time spent: 28 minutes  [place]

## 2019-10-28 LAB — BASIC METABOLIC PANEL
Anion gap: 8 (ref 5–15)
BUN: 16 mg/dL (ref 8–23)
CO2: 32 mmol/L (ref 22–32)
Calcium: 8.9 mg/dL (ref 8.9–10.3)
Chloride: 98 mmol/L (ref 98–111)
Creatinine, Ser: 1.05 mg/dL — ABNORMAL HIGH (ref 0.44–1.00)
GFR calc Af Amer: 58 mL/min — ABNORMAL LOW (ref >60)
GFR calc non Af Amer: 50 mL/min — ABNORMAL LOW (ref >60)
Glucose, Bld: 182 mg/dL — ABNORMAL HIGH (ref 70–99)
Potassium: 4.1 mmol/L (ref 3.5–5.1)
Sodium: 138 mmol/L (ref 135–145)

## 2019-10-28 LAB — GLUCOSE, CAPILLARY
Glucose-Capillary: 165 mg/dL — ABNORMAL HIGH (ref 70–99)
Glucose-Capillary: 204 mg/dL — ABNORMAL HIGH (ref 70–99)
Glucose-Capillary: 225 mg/dL — ABNORMAL HIGH (ref 70–99)
Glucose-Capillary: 308 mg/dL — ABNORMAL HIGH (ref 70–99)

## 2019-10-28 LAB — CULTURE, BLOOD (ROUTINE X 2)
Culture: NO GROWTH
Culture: NO GROWTH
Special Requests: ADEQUATE

## 2019-10-28 LAB — HEMOGLOBIN: Hemoglobin: 8.5 g/dL — ABNORMAL LOW (ref 12.0–15.0)

## 2019-10-28 MED ORDER — POTASSIUM CHLORIDE CRYS ER 20 MEQ PO TBCR: 40.0000 meq | EXTENDED_RELEASE_TABLET | Freq: Every day | ORAL | 0 refills | Status: DC

## 2019-10-28 MED ORDER — AZITHROMYCIN 250 MG PO TABS: 250.0000 mg | ORAL_TABLET | Freq: Every day | ORAL | Status: DC

## 2019-10-28 MED ORDER — CHOLESTYRAMINE 4 G PO PACK: 4.0000 g | PACK | Freq: Every day | ORAL | Status: DC

## 2019-10-28 MED ORDER — ALBUTEROL SULFATE HFA 108 (90 BASE) MCG/ACT IN AERS
2.0000 | INHALATION_SPRAY | Freq: Four times a day (QID) | RESPIRATORY_TRACT | 0 refills | Status: DC | PRN
Start: 2019-10-28 — End: 2022-04-14

## 2019-10-28 MED ORDER — INSULIN DETEMIR 100 UNIT/ML FLEXPEN
5.0000 [IU] | PEN_INJECTOR | Freq: Every evening | SUBCUTANEOUS | 0 refills | Status: DC
Start: 2019-10-28 — End: 2020-05-21

## 2019-10-28 MED ORDER — TORSEMIDE 20 MG PO TABS: 20.0000 mg | ORAL_TABLET | Freq: Every day | ORAL | 0 refills | Status: DC

## 2019-10-28 MED ORDER — METOPROLOL SUCCINATE ER 25 MG PO TB24: 12.5000 mg | ORAL_TABLET | Freq: Every day | ORAL | 0 refills | Status: DC

## 2019-10-28 MED ORDER — GUAIFENESIN 100 MG/5ML PO SOLN: 5.0000 mL | ORAL | 0 refills | Status: DC | PRN

## 2019-10-28 MED ORDER — GABAPENTIN 300 MG PO CAPS: 300.0000 mg | ORAL_CAPSULE | Freq: Two times a day (BID) | ORAL | Status: DC

## 2019-10-28 MED ORDER — INSULIN PEN NEEDLE 31G X 8 MM MISC: 1.0000 | Freq: Every day | 0 refills | Status: DC

## 2019-10-28 MED ORDER — ZINC OXIDE 40 % EX OINT: TOPICAL_OINTMENT | Freq: Two times a day (BID) | CUTANEOUS | 0 refills | Status: DC

## 2019-10-28 MED ORDER — TRAZODONE HCL 100 MG PO TABS: 100.0000 mg | ORAL_TABLET | Freq: Every day | ORAL | 0 refills | Status: DC

## 2019-10-28 MED ORDER — HYDROCORTISONE ACETATE 25 MG RE SUPP: 25.0000 mg | Freq: Two times a day (BID) | RECTAL | 0 refills | Status: DC

## 2019-10-28 MED ORDER — POTASSIUM CHLORIDE CRYS ER 20 MEQ PO TBCR: 40.0000 meq | EXTENDED_RELEASE_TABLET | Freq: Every day | ORAL | Status: DC

## 2019-10-28 NOTE — Progress Notes (Signed)
Inpatient Diabetes Program Recommendations  AACE/ADA: New Consensus Statement on Inpatient Glycemic Control   Target Ranges:  Prepandial:   less than 140 mg/dL      Peak postprandial:   less than 180 mg/dL (1-2 hours)      Critically ill patients:  140 - 180 mg/dL   Results for Kristi [name], Kristi [name] (MRN [id number]) as of 10/28/2019 10:31  Ref. Range 10/27/2019 07:50 10/27/2019 12:28 10/27/2019 15:43 10/27/2019 20:39 10/28/2019 00:01 10/28/2019 04:31 10/28/2019 07:43  Glucose-Capillary Latest Ref Range: 70 - 99 mg/dL 242 (H) 363 (H) 293 (H) 351 (H) 308 (H) 225 (H) 165 (H)   Review of Glycemic Control  Diabetes history:DM2 Outpatient Diabetes medications:Actos 30 mg daily Current orders for Inpatient glycemic control:Novolog 0-9 units Q4H  Inpatient Diabetes Program Recommendations:  Insulin-Please consider ordering Levemir 5 units Q24H and Novolog 3 units TID with meals for meal coverage if patient eats at least 50% of meals.  Outpatient DM medications: Noted patient has a history of CHF and has Actos on home medication list for DM. Actos should not be used in patient with CHF due to side effect of fluid retention.Please consider discontinuing Actos and use different DM medication for DM management outpatient.  NOTE: Patient has Actos listed as only DM medication on home medication list. Patient has history of CHF; therefore should not be taking Actosdue to side effect of fluid retention. Noted patient was recently inpatient 09/25/19-7/12/21and per discharge summaryon 09/30/19 patient was instructed to stop Amaryl and continue Actos.   Thanks, Barnie Alderman, RN, MSN, CDE Diabetes Coordinator Inpatient Diabetes Program [phone] (Team Pager from 8am to 5pm)

## 2019-10-28 NOTE — Plan of Care (Signed)

## 2019-10-28 NOTE — Care Management Important Message (Signed)
Important Message  Patient Details  Name: Kristi [name] MRN: [id number] Date of Birth: 05/30/1940   Medicare Important Message Given:  Yes     Dannette Barbara 10/28/2019, 11:55 AM

## 2019-10-28 NOTE — Consult Note (Signed)
[place] for Electrolyte Monitoring and Replacement   Recent Labs: Potassium (mmol/L)  Date Value  10/28/2019 4.1  02/19/2014 3.5   Magnesium (mg/dL)  Date Value  10/27/2019 2.1   Calcium (mg/dL)  Date Value  10/28/2019 8.9   Calcium, Total (mg/dL)  Date Value  02/19/2014 9.1   Albumin (g/dL)  Date Value  10/23/2019 3.7   Sodium (mmol/L)  Date Value  10/28/2019 138  08/13/2019 142  02/19/2014 143   Corrected Ca: 8.94 mg/dL  Assessment: Patient is an 79 y/o F with medical history including CAD, CHF, diabetes, atrial fibrillation, hx of CVA who presented to the ED after falling at home. Patient is receiving KCl 20 mEq TID scheduled.   Goal of Therapy:  Potassium 4.0 - 5.1 mmol/L Magnesium 2.0 - 2.4 mg/dL All Other Electrolytes WNL  Plan:   KCl 20 mEq TID per Dr Leslye Peer, pt is currently on torsemide 20 mg daily - probably can discharge on KCl 40 mEq daily.   F/u with electrolytes in the AM  Oswald Hillock, PharmD, BCPS.  Clinical Pharmacist 10/28/2019 7:52 AM

## 2019-10-28 NOTE — Progress Notes (Signed)
Discharge instructions were reviewed with pt. Opportunity for questions made available. Patient verbalized understanding of d/c instructions. IV & telemetry removed from pt for d/c home. Family to transport pt home.

## 2019-10-28 NOTE — Progress Notes (Signed)
[place] visited pt. per referral from 2A rounds; pt. lying in bed, awake and amenable to visit.  Pt. says she has pneumonia but feels much better and would like to go home; pt. lives alone but family is organizing home health to aid pt.  Dtr. and son have been visiting regularly and providing support; pt. appears to be coping effectively at this time.

## 2019-10-28 NOTE — Discharge Instructions (Signed)
Community-Acquired Pneumonia, Adult Pneumonia is an infection of the lungs. It causes swelling in the airways of the lungs. Mucus and fluid may also build up inside the airways. One type of pneumonia can happen while a person is in a hospital. A different type can happen when a person is not in a hospital (community-acquired pneumonia).  What are the causes?  This condition is caused by germs (viruses, bacteria, or fungi). Some types of germs can be passed from one person to another. This can happen when you breathe in droplets from the cough or sneeze of an infected person. What increases the risk? You are more likely to develop this condition if you:  Have a long-term (chronic) disease, such as: ? Chronic obstructive pulmonary disease (COPD). ? Asthma. ? Cystic fibrosis. ? Congestive heart failure. ? Diabetes. ? Kidney disease.  Have HIV.  Have sickle cell disease.  Have had your spleen removed.  Do not take good care of your teeth and mouth (poor dental hygiene).  Have a medical condition that increases the risk of breathing in droplets from your own mouth and nose.  Have a weakened body defense system (immune system).  Are a smoker.  Travel to areas where the germs that cause this illness are common.  Are around certain animals or the places they live. What are the signs or symptoms?  A dry cough.  A wet (productive) cough.  Fever.  Sweating.  Chest pain. This often happens when breathing deeply or coughing.  Fast breathing or trouble breathing.  Shortness of breath.  Shaking chills.  Feeling tired (fatigue).  Muscle aches. How is this treated? Treatment for this condition depends on many things. Most adults can be treated at home. In some cases, treatment must happen in a hospital. Treatment may include:  Medicines given by mouth or through an IV tube.  Being given extra oxygen.  Respiratory therapy. In rare cases, treatment for very bad pneumonia  may include:  Using a machine to help you breathe.  Having a procedure to remove fluid from around your lungs. Follow these instructions at home: Medicines  Take over-the-counter and prescription medicines only as told by your doctor. ? Only take cough medicine if you are losing sleep.  If you were prescribed an antibiotic medicine, take it as told by your doctor. Do not stop taking the antibiotic even if you start to feel better. General instructions   Sleep with your head and neck raised (elevated). You can do this by sleeping in a recliner or by putting a few pillows under your head.  Rest as needed. Get at least 8 hours of sleep each night.  Drink enough water to keep your pee (urine) pale yellow.  Eat a healthy diet that includes plenty of vegetables, fruits, whole grains, low-fat dairy products, and lean protein.  Do not use any products that contain nicotine or tobacco. These include cigarettes, e-cigarettes, and chewing tobacco. If you need help quitting, ask your doctor.  Keep all follow-up visits as told by your doctor. This is important. How is this prevented? A shot (vaccine) can help prevent pneumonia. Shots are often suggested for:  People older than 81 years of age.  People older than 81 years of age who: ? Are having cancer treatment. ? Have long-term (chronic) lung disease. ? Have problems with their body's defense system. You may also prevent pneumonia if you take these actions:  Get the flu (influenza) shot every year.  Go to the dentist as   often as told.  Wash your hands often. If you cannot use soap and water, use hand sanitizer. Contact a doctor if:  You have a fever.  You lose sleep because your cough medicine does not help. Get help right away if:  You are short of breath and it gets worse.  You have more chest pain.  Your sickness gets worse. This is very serious if: ? You are an older adult. ? Your body's defense system is weak.  You  cough up blood. Summary  Pneumonia is an infection of the lungs.  Most adults can be treated at home. Some will need treatment in a hospital.  Drink enough water to keep your pee pale yellow.  Get at least 8 hours of sleep each night. This information is not intended to replace advice given to you by your health care provider. Make sure you discuss any questions you have with your health care provider. Document Revised: 06/27/2018 Document Reviewed: 11/02/2017 Elsevier Patient Education  2020 [employer]  

## 2019-10-28 NOTE — Discharge Summary (Signed)
[facility] NAME: Kristi [name]    MR#:  [id number]  DATE OF BIRTH:  1940/06/10  DATE OF ADMISSION:  10/23/2019 ADMITTING PHYSICIAN: Max Sane, MD  DATE OF DISCHARGE: 10/28/2019  PRIMARY CARE PHYSICIAN: Kristi Market, MD    ADMISSION DIAGNOSIS:  Pneumonia [J18.9] HCAP (healthcare-associated pneumonia) [J18.9] Sepsis, due to unspecified organism, unspecified whether acute organ dysfunction present ([place]) [A41.9]  DISCHARGE DIAGNOSIS:  Active Problems:   Type 2 diabetes mellitus with hyperlipidemia (HCC)   Bright red blood per rectum   Atrial fibrillation status post cardioversion (HCC)   Lobar pneumonia (HCC)   Persistent atrial fibrillation (HCC)   Hypotension   Hyponatremia   Acute delirium   HCAP (healthcare-associated pneumonia)   SECONDARY DIAGNOSIS:   Past Medical History:  Diagnosis Date  . Anxiety   . Arthritis   . Broken arm    left FA 3/17  . Broken arm    left  . CAD (coronary artery disease)    s/p MI  . Cancer ([place])    uterine  . Cervical disc disorder   . Depression   . Diabetes mellitus without complication ([place])   . GERD (gastroesophageal reflux disease)   . Headache   . Hyperlipidemia   . Hypertension   . Hypothyroid   . Myocardial infarction ([place])    15 year ago  . Neck pain 06/04/2014  . Parkinson's disease ([place])   . Presence of permanent cardiac pacemaker   . Spinal stenosis     HOSPITAL COURSE:   1.  Persistent atrial fibrillation that was converted to normal sinus rhythm with cardioversion.  I changed metoprolol over to Toprol XL at night since her blood pressure on the lower side.  Patient is in normal sinus rhythm upon disposition.  Continue Eliquis. 2.  Acute delirium and insomnia.  This has improved with getting the patient sleeping with trazodone.  Can continue trazodone at home.  Best thing with do is get her back into her home environment.  Home health ordered. 3.  Bright  red blood per rectum.  Stool studies were negative.  As needed Imodium.  Need to keep taking Eliquis after cardioversion.  I did give Anusol suppositories and this seems to settle down the bleeding.  Likely hemorrhoidal in nature.  Hemoglobin drifted down during the hospital course.  I did give IV iron.  Hemoglobin upon discharge 8.5.  Recommend checking hemoglobin as outpatient. 4.  Lobar pneumonia.  Finished Rocephin and Zithromax during the hospital course.  I did give nebulizer treatments here will prescribe albuterol inhaler upon going home. 5.  Hypokalemia.  This was very low on presentation and was replaced and can go home on 40 a day. 6.  Hyponatremia.  This has improved with IV fluids on presentation 7.  Hypotension on presentation this has resolved and is actually back on low-dose Toprol and low-dose Demadex. 8.  Hyperlipidemia unspecified on Crestor 9.  Depression on Zoloft 10. Hypothyroidism unspecified on levothyroxine 11.  Type 2 diabetes mellitus with hyperlipidemia diabetes coordinator recommended 5 units of Levemir in the evening.  Daughter states that she will do this injection.  They have a glucometer at home and can check the sugars.  DISCHARGE CONDITIONS:   Satisfactory  CONSULTS OBTAINED:  Cardiology  DRUG ALLERGIES:   Allergies  Allergen Reactions  . Penicillins Anaphylaxis, Swelling and Rash    TOLERATES CEFTRIAXONE    DISCHARGE MEDICATIONS:   Allergies as of 10/28/2019  Reactions   Penicillins Anaphylaxis, Swelling, Rash   TOLERATES CEFTRIAXONE      Medication List    STOP taking these medications   amiodarone 200 MG tablet Commonly known as: PACERONE   docusate sodium 100 MG capsule Commonly known as: COLACE   Fish Oil 1000 MG Caps   hydrOXYzine 25 MG tablet Commonly known as: ATARAX/VISTARIL   Linzess 72 MCG capsule Generic drug: linaclotide   nystatin 100000 UNIT/ML suspension Commonly known as: MYCOSTATIN   pioglitazone 30 MG  tablet Commonly known as: ACTOS   pregabalin 25 MG capsule Commonly known as: LYRICA     TAKE these medications   albuterol 108 (90 Base) MCG/ACT inhaler Commonly known as: VENTOLIN HFA Inhale 2 puffs into the lungs every 6 (six) hours as needed for wheezing or shortness of breath.   apixaban 5 MG Tabs tablet Commonly known as: ELIQUIS Take 1 tablet (5 mg total) by mouth 2 (two) times daily.   cholecalciferol 25 MCG (1000 UNIT) tablet Commonly known as: VITAMIN D3 Take 1,000 Units by mouth daily.   cholestyramine 4 g packet Commonly known as: Questran Take 1 packet (4 g total) by mouth daily. in water   donepezil 10 MG tablet Commonly known as: ARICEPT Take 10 mg by mouth daily.   gabapentin 300 MG capsule Commonly known as: NEURONTIN Take 1 capsule (300 mg total) by mouth 2 (two) times daily. Take 300 mg in the morning and 600 mg at night What changed:   how much to take  when to take this   guaiFENesin 100 MG/5ML Soln Commonly known as: ROBITUSSIN Take 5 mLs (100 mg total) by mouth every 4 (four) hours as needed for cough or to loosen phlegm.   hydrocortisone 25 MG suppository Commonly known as: ANUSOL-HC Place 1 suppository (25 mg total) rectally 2 (two) times daily. Okay to substitute generic   insulin detemir 100 UNIT/ML FlexPen Commonly known as: LEVEMIR Inject 5 Units into the skin every evening.   Insulin Pen Needle 31G X 8 MM Misc 1 Dose by Does not apply route at bedtime.   levothyroxine 200 MCG tablet Commonly known as: SYNTHROID Take 200 mcg by mouth daily.   liver oil-zinc oxide 40 % ointment Commonly known as: DESITIN Apply topically 2 (two) times daily. Apply to buttock and groin ares   loratadine 10 MG tablet Commonly known as: CLARITIN Take 10 mg by mouth daily.   meclizine 12.5 MG tablet Commonly known as: ANTIVERT Take 1 tablet (12.5 mg total) by mouth 2 (two) times daily as needed for dizziness.   metoprolol succinate 25 MG 24 hr  tablet Commonly known as: TOPROL-XL Take 0.5 tablets (12.5 mg total) by mouth at bedtime.   montelukast 10 MG tablet Commonly known as: SINGULAIR Take 10 mg by mouth at bedtime.   multivitamin with minerals Tabs tablet Take 1 tablet by mouth daily.   nitroGLYCERIN 0.4 MG SL tablet Commonly known as: NITROSTAT Place 0.4 mg under the tongue every 5 (five) minutes as needed for chest pain.   OLANZapine 2.5 MG tablet Commonly known as: ZYPREXA Take 2.5 mg by mouth in the morning and at bedtime.   omeprazole 40 MG capsule Commonly known as: PRILOSEC Take 40 mg by mouth 2 (two) times daily.   potassium chloride SA 20 MEQ tablet Commonly known as: KLOR-CON Take 2 tablets (40 mEq total) by mouth daily. Start taking on: October 29, 2019 What changed:   how much to take  when to take  this   rosuvastatin 40 MG tablet Commonly known as: CRESTOR Take 40 mg by mouth daily.   sertraline 100 MG tablet Commonly known as: ZOLOFT Take 100 mg by mouth 2 (two) times daily.   torsemide 20 MG tablet Commonly known as: DEMADEX Take 1 tablet (20 mg total) by mouth daily. Start taking on: October 29, 2019 What changed:   how much to take  when to take this   traMADol 50 MG tablet Commonly known as: ULTRAM Take 50 mg by mouth 3 (three) times daily as needed for moderate pain.   traZODone 100 MG tablet Commonly known as: DESYREL Take 1 tablet (100 mg total) by mouth at bedtime.   vitamin B-12 1000 MCG tablet Commonly known as: CYANOCOBALAMIN Take 1,000 mcg by mouth daily.   vitamin E 180 MG (400 UNITS) capsule Take 400 Units by mouth daily.        DISCHARGE INSTRUCTIONS:   Follow-up PMD 5 days  If you experience worsening of your admission symptoms, develop shortness of breath, life threatening emergency, suicidal or homicidal thoughts you must seek medical attention immediately by calling 911 or calling your MD immediately  if symptoms less severe.  You Must read  complete instructions/literature along with all the possible adverse reactions/side effects for all the Medicines you take and that have been prescribed to you. Take any new Medicines after you have completely understood and accept all the possible adverse reactions/side effects.   Please note  You were cared for by a hospitalist during your hospital stay. If you have any questions about your discharge medications or the care you received while you were in the hospital after you are discharged, you can call the unit and asked to speak with the hospitalist on call if the hospitalist that took care of you is not available. Once you are discharged, your primary care physician will handle any further medical issues. Please note that NO REFILLS for any discharge medications will be authorized once you are discharged, as it is imperative that you return to your primary care physician (or establish a relationship with a primary care physician if you do not have one) for your aftercare needs so that they can reassess your need for medications and monitor your lab values.    Today   CHIEF COMPLAINT:   Chief Complaint  Patient presents with  . Altered Mental Status    HISTORY OF PRESENT ILLNESS:  Kristi [name]  is a 79 y.o. female was brought in with altered mental status   VITAL SIGNS:  Blood pressure (!) 97/41, pulse (!) 59, temperature 97.8 F (36.6 C), resp. rate 17, height 5\' 6"  (1.676 m), weight 86.2 kg, SpO2 91 %. Prior blood pressure 116/50, 127/67 I/O:    Intake/Output Summary (Last 24 hours) at 10/28/2019 1725 Last data filed at 10/28/2019 1521 Gross per 24 hour  Intake 240 ml  Output 1550 ml  Net -1310 ml    PHYSICAL EXAMINATION:  GENERAL:  79 y.o.-year-old patient lying in the bed with no acute distress.  EYES: Pupils equal, round, reactive to light and accommodation. No scleral icterus. Extraocular muscles intact.  HEENT: Head atraumatic, normocephalic. Oropharynx and  nasopharynx clear.   LUNGS: Normal breath sounds bilaterally, no wheezing, rales,rhonchi or crepitation. No use of accessory muscles of respiration.  CARDIOVASCULAR: S1, S2 normal. No murmurs, rubs, or gallops.  ABDOMEN: Soft, non-tender, non-distended.  EXTREMITIES: No pedal edema.  NEUROLOGIC: Cranial nerves II through XII are intact. Muscle strength 5/5 in  all extremities.  PSYCHIATRIC: The patient is alert and oriented x 3.  SKIN: No obvious rash, lesion, or ulcer.   DATA REVIEW:   CBC Recent Labs  Lab 10/25/19 0903 10/25/19 1255 10/28/19 0638  WBC 6.7  --   --   HGB 9.9*   < > 8.5*  HCT 30.7*  --   --   PLT 131*  --   --    < > = values in this interval not displayed.    Chemistries  Recent Labs  Lab 10/23/19 1109 10/23/19 1811 10/27/19 0724 10/27/19 0724 10/28/19 0638  NA 128*   < > 139   < > 138  K 2.3*   < > 3.8   < > 4.1  CL 81*   < > 99   < > 98  CO2 32   < > 31   < > 32  GLUCOSE 594*   < > 239*   < > 182*  BUN 22   < > 12   < > 16  CREATININE 1.11*   < > 0.98   < > 1.05*  CALCIUM 8.3*   < > 8.7*   < > 8.9  MG 1.9   < > 2.1  --   --   AST 52*  --   --   --   --   ALT 50*  --   --   --   --   ALKPHOS 118  --   --   --   --   BILITOT 0.8  --   --   --   --    < > = values in this interval not displayed.    Microbiology Results  Results for orders placed or performed during the hospital encounter of 10/23/19  SARS Coronavirus 2 by RT PCR (hospital order, performed in [facility] hospital lab) Nasopharyngeal Nasopharyngeal Swab     Status: None   Collection Time: 10/23/19 11:09 AM   Specimen: Nasopharyngeal Swab  Result Value Ref Range Status   SARS Coronavirus 2 NEGATIVE NEGATIVE Final    Comment: (NOTE) SARS-CoV-2 target nucleic acids are NOT DETECTED.  The SARS-CoV-2 RNA is generally detectable in upper and lower respiratory specimens during the acute phase of infection. The lowest concentration of SARS-CoV-2 viral copies this assay can detect is  250 copies / mL. A negative result does not preclude SARS-CoV-2 infection and should not be used as the sole basis for treatment or other patient management decisions.  A negative result may occur with improper specimen collection / handling, submission of specimen other than nasopharyngeal swab, presence of viral mutation(s) within the areas targeted by this assay, and inadequate number of viral copies (<250 copies / mL). A negative result must be combined with clinical observations, patient history, and epidemiological information.  Fact Sheet for Patients:   StrictlyIdeas.no  Fact Sheet for Healthcare Providers: BankingDealers.co.za  This test is not yet approved or  cleared by the [place] FDA and has been authorized for detection and/or diagnosis of SARS-CoV-2 by FDA under an Emergency Use Authorization (EUA).  This EUA will remain in effect (meaning this test can be used) for the duration of the COVID-19 declaration under Section 564(b)(1) of the Act, 21 U.S.C. section 360bbb-3(b)(1), unless the authorization is terminated or revoked sooner.  Performed at [facility], [address] [place], [place] [address]   Culture, blood (Routine X 2) w Reflex to ID Panel  Status: None   Collection Time: 10/23/19  1:57 PM   Specimen: BLOOD  Result Value Ref Range Status   Specimen Description BLOOD RIGHT ANTECUBITAL  Final   Special Requests   Final    BOTTLES DRAWN AEROBIC AND ANAEROBIC Blood Culture results may not be optimal due to an inadequate volume of blood received in culture bottles   Culture   Final    NO GROWTH 5 DAYS Performed at [facility], [address] [place], [place] [address]    Report Status 10/28/2019 FINAL  Final  Culture, blood (Routine X 2) w Reflex to ID Panel     Status: None   Collection Time: 10/23/19  6:10 PM   Specimen: BLOOD  Result Value Ref Range Status   Specimen  Description BLOOD RIGHT ANTECUBITAL  Final   Special Requests   Final    BOTTLES DRAWN AEROBIC AND ANAEROBIC Blood Culture adequate volume   Culture   Final    NO GROWTH 5 DAYS Performed at [facility], [address], [place] [employer], [place] [address]    Report Status 10/28/2019 FINAL  Final  C Difficile Quick Screen w PCR reflex     Status: None   Collection Time: 10/25/19  9:13 AM   Specimen: STOOL  Result Value Ref Range Status   C Diff antigen NEGATIVE NEGATIVE Final   C Diff toxin NEGATIVE NEGATIVE Final   C Diff interpretation No C. difficile detected.  Final    Comment: Performed at [facility], [address] [place], [place] [address]  Gastrointestinal Panel by PCR , Stool     Status: None   Collection Time: 10/25/19  9:13 AM   Specimen: Stool  Result Value Ref Range Status   Campylobacter species NOT DETECTED NOT DETECTED Final   Plesimonas shigelloides NOT DETECTED NOT DETECTED Final   Salmonella species NOT DETECTED NOT DETECTED Final   Yersinia enterocolitica NOT DETECTED NOT DETECTED Final   Vibrio species NOT DETECTED NOT DETECTED Final   Vibrio cholerae NOT DETECTED NOT DETECTED Final   Enteroaggregative E coli (EAEC) NOT DETECTED NOT DETECTED Final   Enteropathogenic E coli (EPEC) NOT DETECTED NOT DETECTED Final   Enterotoxigenic E coli (ETEC) NOT DETECTED NOT DETECTED Final   Shiga like toxin producing E coli (STEC) NOT DETECTED NOT DETECTED Final   Shigella/Enteroinvasive E coli (EIEC) NOT DETECTED NOT DETECTED Final   Cryptosporidium NOT DETECTED NOT DETECTED Final   Cyclospora cayetanensis NOT DETECTED NOT DETECTED Final   Entamoeba histolytica NOT DETECTED NOT DETECTED Final   Giardia lamblia NOT DETECTED NOT DETECTED Final   Adenovirus F40/41 NOT DETECTED NOT DETECTED Final   Astrovirus NOT DETECTED NOT DETECTED Final   Norovirus GI/GII NOT DETECTED NOT DETECTED Final   Rotavirus A NOT DETECTED NOT DETECTED Final   Sapovirus (I, II, IV,  and V) NOT DETECTED NOT DETECTED Final    Comment: Performed at [facility], [address], [place],  [address]      Management plans discussed with the patient, family and they are in agreement.  CODE STATUS:     Code Status Orders  (From admission, onward)         Start     Ordered   10/23/19 1359  Full code  Continuous        10/23/19 1401        Code Status History    Date Active Date Inactive Code Status Order ID Comments User Context  09/25/2019 1138 09/30/2019 2324 Full Code [id number]  Collier Bullock, MD ED   09/05/2019 1035 09/05/2019 1658 Full Code [id number]  [name], MD Inpatient   09/05/2019 1034 09/05/2019 1035 Full Code [id number]  [name], MD Inpatient   11/20/2018 2158 11/21/2018 2040 DNR [id number]  Vaughan Basta, MD Inpatient   04/11/2017 0602 04/18/2017 1834 Full Code [id number]  Saundra Shelling, MD Inpatient   12/24/2016 0640 12/26/2016 2020 Full Code [id number]  Harrie Foreman, MD ED   11/17/2016 0450 11/17/2016 1954 Full Code [id number]  Saundra Shelling, MD Inpatient   09/29/2016 2002 09/30/2016 1605 Full Code [id number]  Epifanio Lesches, MD ED   Advance Care Planning Activity    Advance Directive Documentation     Most Recent Value  Type of Advance Directive Healthcare Power of Attorney  Pre-existing out of facility DNR order (yellow form or pink MOST form) --  "MOST" Form in Place? --      TOTAL TIME TAKING CARE OF THIS PATIENT: 35 minutes.    Loletha Grayer M.D on 10/28/2019 at 5:25 PM  Between 7am to 6pm - Pager - [phone]  After 6pm go to [email or web address] - password [email or web address]  [facility]  CC: Primary care physician; Kristi Market, MD

## 2019-10-30 ENCOUNTER — Ambulatory Visit: Payer: Medicare Other | Admitting: Family

## 2019-11-04 ENCOUNTER — Ambulatory Visit: Payer: Medicare Other | Admitting: Family

## 2019-11-06 ENCOUNTER — Ambulatory Visit: Payer: Medicare Other | Admitting: Family

## 2019-11-07 ENCOUNTER — Telehealth: Payer: Self-pay | Admitting: Internal Medicine

## 2019-11-07 NOTE — Telephone Encounter (Signed)
I spoke with the patient. She was recently hospitalized from 10/23/19- 10/28/19 with pneumonia/ sepsis:  HOSPITAL COURSE:   1.  Persistent atrial fibrillation that was converted to normal sinus rhythm with cardioversion.  I changed metoprolol over to Toprol XL at night since her blood pressure on the lower side.  Patient is in normal sinus rhythm upon disposition.  Continue Eliquis. 2.  Acute delirium and insomnia.  This has improved with getting the patient sleeping with trazodone.  Can continue trazodone at home.  Best thing with do is get her back into her home environment.  Home health ordered. 3.  Bright red blood per rectum.  Stool studies were negative.  As needed Imodium.  Need to keep taking Eliquis after cardioversion.  I did give Anusol suppositories and this seems to settle down the bleeding.  Likely hemorrhoidal in nature.  Hemoglobin drifted down during the hospital course.  I did give IV iron.  Hemoglobin upon discharge 8.5.  Recommend checking hemoglobin as outpatient. 4.  Lobar pneumonia.  Finished Rocephin and Zithromax during the hospital course.  I did give nebulizer treatments here will prescribe albuterol inhaler upon going home. 5.  Hypokalemia.  This was very low on presentation and was replaced and can go home on 40 a day. 6.  Hyponatremia.  This has improved with IV fluids on presentation 7.  Hypotension on presentation this has resolved and is actually back on low-dose Toprol and low-dose Demadex. 8.  Hyperlipidemia unspecified on Crestor 9.  Depression on Zoloft 10. Hypothyroidism unspecified on levothyroxine 11.  Type 2 diabetes mellitus with hyperlipidemia diabetes coordinator recommended 5 units of Levemir in the evening.  Daughter states that she will do this injection.  They have a glucometer at home and can check the sugars.   The patient called today to advise that she is having increased lower extremity (R>L) swelling and abdominal swelling that is progressively  getting worse over the last week. Her torsemide was decreased from 40 mg BID to 20 mg once daily at discharge.  The patient denies any change in fluid/ salt intake. She has not been weighing regularly, but did weigh 188.8 lbs today.  She has been encouraged to weigh daily in the AM prior to eating/ drinking and write these weights down. The patient is in no audible distress over the phone.  SOB is worse with exertion.  She feels like her urine output is adequate and sometimes "a lot."  I have advised her we should see her in the office to follow up as it has been very hard to find the right balance for her fluid medication and we need to be careful with her electrolytes. I offered her an APP appointment tomorrow, but she declined this until Tuesday next week due to her transportation. I have advised the patient to elevate her lower extremities when sitting.  She is aware I will send this message to her primary cardiologist to review and we will call her back prior to Tuesday with any further recommendations.  The patient voices understanding and is agreeable.

## 2019-11-07 NOTE — Telephone Encounter (Signed)
Pt c/o swelling: STAT is pt has developed SOB within 24 hours  1) How much weight have you gained and in what time span?   2) If swelling, where is the swelling located? Legs and abdomen  3) Are you currently taking a fluid pill? Reduced to 20 mg when dc'ed from hospital a week ago  4) Are you currently SOB? On exertion   5) Do you have a log of your daily weights (if so, list)? no  6) Have you gained 3 pounds in a day or 5 pounds in a week? no  7) Have you traveled recently? No, was recently in hospital for CHF and pneumonia

## 2019-11-08 ENCOUNTER — Ambulatory Visit: Payer: Medicare Other | Admitting: Family

## 2019-11-12 ENCOUNTER — Ambulatory Visit: Payer: Medicare Other | Admitting: Cardiology

## 2019-11-14 NOTE — Telephone Encounter (Signed)
Spoke with patient and relayed Dr. Thereasa Solo advice to increase Torsemide to BID.  Patient verbalized understanding and agreed with plan.

## 2019-11-18 ENCOUNTER — Other Ambulatory Visit: Payer: Self-pay

## 2019-11-18 ENCOUNTER — Ambulatory Visit (INDEPENDENT_AMBULATORY_CARE_PROVIDER_SITE_OTHER): Payer: Medicare Other | Admitting: Vascular Surgery

## 2019-11-18 ENCOUNTER — Encounter (INDEPENDENT_AMBULATORY_CARE_PROVIDER_SITE_OTHER): Payer: Self-pay | Admitting: Vascular Surgery

## 2019-11-18 VITALS — BP 143/86 | HR 85 | Resp 16 | Wt 181.8 lb

## 2019-11-18 DIAGNOSIS — I4819 Other persistent atrial fibrillation: Secondary | ICD-10-CM | POA: Diagnosis not present

## 2019-11-18 DIAGNOSIS — I7025 Atherosclerosis of native arteries of other extremities with ulceration: Secondary | ICD-10-CM | POA: Diagnosis not present

## 2019-11-18 DIAGNOSIS — I10 Essential (primary) hypertension: Secondary | ICD-10-CM

## 2019-11-18 DIAGNOSIS — I25118 Atherosclerotic heart disease of native coronary artery with other forms of angina pectoris: Secondary | ICD-10-CM

## 2019-11-18 DIAGNOSIS — E1169 Type 2 diabetes mellitus with other specified complication: Secondary | ICD-10-CM

## 2019-11-18 DIAGNOSIS — E785 Hyperlipidemia, unspecified: Secondary | ICD-10-CM

## 2019-11-18 MED ORDER — SILVER SULFADIAZINE 1 % EX CREA
1.0000 | TOPICAL_CREAM | Freq: Two times a day (BID) | CUTANEOUS | 0 refills | Status: DC
Start: 2019-11-18 — End: 2020-05-08
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
## 2019-11-19 ENCOUNTER — Encounter: Payer: Self-pay | Admitting: Internal Medicine

## 2019-11-19 ENCOUNTER — Telehealth: Payer: Self-pay | Admitting: Internal Medicine

## 2019-11-19 ENCOUNTER — Ambulatory Visit (INDEPENDENT_AMBULATORY_CARE_PROVIDER_SITE_OTHER): Payer: Medicare Other | Admitting: Internal Medicine

## 2019-11-19 VITALS — BP 110/70 | HR 64 | Ht 66.0 in | Wt 181.0 lb

## 2019-11-19 DIAGNOSIS — I5042 Chronic combined systolic (congestive) and diastolic (congestive) heart failure: Secondary | ICD-10-CM

## 2019-11-19 DIAGNOSIS — I480 Paroxysmal atrial fibrillation: Secondary | ICD-10-CM

## 2019-11-19 DIAGNOSIS — I495 Sick sinus syndrome: Secondary | ICD-10-CM | POA: Diagnosis not present

## 2019-11-19 DIAGNOSIS — Z950 Presence of cardiac pacemaker: Secondary | ICD-10-CM | POA: Diagnosis not present

## 2019-11-19 DIAGNOSIS — I440 Atrioventricular block, first degree: Secondary | ICD-10-CM | POA: Diagnosis not present

## 2019-11-19 DIAGNOSIS — Z79899 Other long term (current) drug therapy: Secondary | ICD-10-CM

## 2019-11-19 MED ORDER — AMIODARONE HCL 200 MG PO TABS
200.0000 mg | ORAL_TABLET | Freq: Every day | ORAL | Status: DC
Start: 2019-11-19 — End: 2019-11-19

## 2019-11-19 MED ORDER — AMIODARONE HCL 200 MG PO TABS: 200.0000 mg | ORAL_TABLET | Freq: Every day | ORAL | 2 refills | Status: DC

## 2019-11-19 MED ORDER — SLOW FE 142 (45 FE) MG PO TBCR: EXTENDED_RELEASE_TABLET | ORAL | Status: DC
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
## 2019-11-19 NOTE — Telephone Encounter (Signed)
I attempted to call the patient. No answer- I left a message to please call back this afternoon before 5 pm or I will call her back tomorrow.  I have sent in her RX for amiodarone 200 mg once daily. I have also reached out to her PCP office, Dr. Bernita Buffy regarding moving up her follow up appointment due to ongoing rectal bleeding.  Per staff, they will move her appointment up. I have asked that they reach out to her directly to schedule as she will need to arrange for/ schedule her transportation. PCP staff is agreeable.

## 2019-11-19 NOTE — Patient Instructions (Signed)
Medication Instructions:  - Your physician has recommended you make the following change in your medication:   1) RESUME amiodarone 200 mg- take 1 tablet by mouth once daily  2) START Slow FE 142 mg (over the counter)- take 1 tablet by mouth twice daily - you may ask your pharmacist to help you locate this over the counter  *If you need a refill on your cardiac medications before your next appointment, please call your pharmacy*   Lab Work: - Your physician recommends that you have lab work today: TSH/ Liver  If you have labs (blood work) drawn today and your tests are completely normal, you will receive your results only by: [name] MyChart Message (if you have MyChart) OR . A paper copy in the mail If you have any lab test that is abnormal or we need to change your treatment, we will call you to review the results.   Testing/Procedures: - none ordered   Follow-Up: At [facility], you and your health needs are our priority.  As part of our continuing mission to provide you with exceptional heart care, we have created designated Provider Care Teams.  These Care Teams include your primary Cardiologist (physician) and Advanced Practice Providers (APPs -  Physician Assistants and Nurse Practitioners) who all work together to provide you with the care you need, when you need it.  We recommend signing up for the patient portal called "MyChart".  Sign up information is provided on this After Visit Summary.  MyChart is used to connect with patients for Virtual Visits (Telemedicine).  Patients are able to view lab/test results, encounter notes, upcoming appointments, etc.  Non-urgent messages can be sent to your provider as well.   To learn more about what you can do with MyChart, go to [email or web address].    Your next appointment:   2 month(s)  The format for your next appointment:   In Person  Provider:   Virl Axe, MD   Other Instructions  1) We will call Dr. Gala Murdoch  office to follow up with you about a sooner appointment for rectal bleeding

## 2019-11-19 NOTE — Telephone Encounter (Signed)
Patient calling stating the she does not have any more of the amiodarone 200 mg. Patient would like Dr. Caryl Comes to call that in as discussed in her visit this morning

## 2019-11-20 LAB — HEPATIC FUNCTION PANEL
ALT: 31 IU/L (ref 0–32)
AST: 45 IU/L — ABNORMAL HIGH (ref 0–40)
Albumin: 4.4 g/dL (ref 3.6–4.6)
Alkaline Phosphatase: 106 IU/L (ref 48–121)
Bilirubin Total: 0.3 mg/dL (ref 0.0–1.2)
Bilirubin, Direct: 0.09 mg/dL (ref 0.00–0.40)
Total Protein: 7 g/dL (ref 6.0–8.5)

## 2019-11-20 LAB — TSH: TSH: 0.037 u[IU]/mL — ABNORMAL LOW (ref 0.450–4.500)

## 2019-11-20 MED ORDER — AMIODARONE HCL 200 MG PO TABS: 200.0000 mg | ORAL_TABLET | Freq: Every day | ORAL | 2 refills | Status: DC

## 2019-11-20 NOTE — Telephone Encounter (Signed)
RX for amiodarone defaulted to a "no print" yesterday and I did not realize this.  I have resubmitted this this morning.   Confirmed her RX did go through to the pharmacy.

## 2019-11-20 NOTE — Telephone Encounter (Signed)
Patient calling to check on status of amiodarone refill - please resend  Patient was informed to contact PCP as they are wanting to make a sooner appointment Patient agreeable

## 2019-11-21 ENCOUNTER — Telehealth: Payer: Self-pay | Admitting: Internal Medicine

## 2019-11-21 NOTE — Telephone Encounter (Signed)
I spoke with the patient regarding her lab results.  I have advised her of Dr. Olin Pia recommendations to repeat her TSH in ~ 6 weeks, however, due to her transportation issues, I have advised her we will recheck this at her follow up appointment on 01/21/20.  The patient voices understanding and is agreeable.

## 2019-11-21 NOTE — Telephone Encounter (Signed)
Kristi Sprang, MD  11/21/2019 12:40 PM EDT     Please inform patient that drug surveillance labs are abnormal with a low TSH.  However, she is on high-dose Synthroid 200 mcg daily. As she previously had hypothyroidism on amiodarone, the resumption of amiodarone may will make her euthyroid. This check the TSH again in about 6 weeks place

## 2019-11-26 ENCOUNTER — Encounter (INDEPENDENT_AMBULATORY_CARE_PROVIDER_SITE_OTHER): Payer: Self-pay | Admitting: Vascular Surgery

## 2019-11-27 MED ORDER — TORSEMIDE 20 MG PO TABS: 20.0000 mg | ORAL_TABLET | Freq: Two times a day (BID) | ORAL | 0 refills | Status: DC

## 2019-11-27 NOTE — Telephone Encounter (Signed)
I spoke with the patient. She was advised by Coleen per Dr. Garen Lah on 11/14/19 that she may take torsemide 20 mg BID. The patient called today stating she was confused if she should be taking torsemide 20 mg BID or QD. She has just been taking this QD and is having abdominal swelling/ lower extremity edema. Her weight flucuates from 178-182 lbs.  I have advised the patient she may take torsemide 20 mg BID per Dr. Thereasa Solo most recent recommendations. She is aware to weigh and record weights every morning.  She is advised to call us back if the BID dosing of torsemide is not working for her.   The patient voices understanding and is agreeable.

## 2019-11-27 NOTE — Addendum Note (Signed)
Addended by: Alvis Lemmings C on: 11/27/2019 05:12 PM   Modules accepted: Orders

## 2019-11-27 NOTE — Telephone Encounter (Signed)
Patient calling in with more fluid retention and curious what her correct fluid pill dosage should be. Patient has conflicting notes of once daily or bid  Pt c/o swelling: STAT is pt has developed SOB within 24 hours  1) How much weight have you gained and in what time span?   2) If swelling, where is the swelling located? Stomach and legs  3) Are you currently taking a fluid pill? Currently take 1 torsemide daily  4) Are you currently SOB? no  5) Do you have a log of your daily weights (if so, list)? no  6) Have you gained 3 pounds in a day or 5 pounds in a week? n/a  7) Have you traveled recently? no

## 2019-11-29 ENCOUNTER — Encounter (INDEPENDENT_AMBULATORY_CARE_PROVIDER_SITE_OTHER): Payer: Self-pay | Admitting: Nurse Practitioner

## 2019-11-29 ENCOUNTER — Ambulatory Visit (INDEPENDENT_AMBULATORY_CARE_PROVIDER_SITE_OTHER): Payer: Medicare Other

## 2019-11-29 ENCOUNTER — Ambulatory Visit: Payer: Medicare Other | Admitting: Podiatry

## 2019-11-29 ENCOUNTER — Other Ambulatory Visit: Payer: Self-pay

## 2019-11-29 ENCOUNTER — Ambulatory Visit (INDEPENDENT_AMBULATORY_CARE_PROVIDER_SITE_OTHER): Payer: Medicare Other | Admitting: Nurse Practitioner

## 2019-11-29 VITALS — BP 123/75 | HR 89 | Resp 16 | Wt 179.0 lb

## 2019-11-29 DIAGNOSIS — E782 Mixed hyperlipidemia: Secondary | ICD-10-CM

## 2019-11-29 DIAGNOSIS — I10 Essential (primary) hypertension: Secondary | ICD-10-CM | POA: Diagnosis not present

## 2019-11-29 DIAGNOSIS — I7025 Atherosclerosis of native arteries of other extremities with ulceration: Secondary | ICD-10-CM

## 2019-11-29 DIAGNOSIS — I890 Lymphedema, not elsewhere classified: Secondary | ICD-10-CM

## 2019-12-02 ENCOUNTER — Telehealth: Payer: Self-pay | Admitting: Internal Medicine

## 2019-12-02 NOTE — Telephone Encounter (Signed)
Called and Left a VM per DPR on file with the following instructions from AVS on 11/19/19:  2) START Slow FE 142 mg (over the counter)- take 1 tablet by mouth twice daily - you may ask your pharmacist to help you locate this over the counter  Encouraged patient to call back if she has any questions or concerns.

## 2019-12-02 NOTE — Telephone Encounter (Signed)
Please call to discuss how often patient should take her iron medication

## 2019-12-03 ENCOUNTER — Encounter (INDEPENDENT_AMBULATORY_CARE_PROVIDER_SITE_OTHER): Payer: Self-pay | Admitting: Nurse Practitioner

## 2019-12-03 NOTE — Progress Notes (Signed)
Subjective:    Patient ID: Kristi [name], female    DOB: 1940/06/23, 79 y.o.   MRN: [id number] Chief Complaint  Patient presents with  . Follow-up    ultrasound follow up    The patient returns to the office for followup and review of the noninvasive studies. There have been no interval changes in lower extremity symptoms. No interval shortening of the patient's claudication distance or development of rest pain symptoms. No new ulcers or wounds have occurred since the last visit.  There have been no significant changes to the patient's overall health care.  The patient denies amaurosis fugax or recent TIA symptoms. There are no recent neurological changes noted. The patient denies history of DVT, PE or superficial thrombophlebitis. The patient denies recent episodes of angina or shortness of breath.   ABI Rt=1.05 and Lt=1.01  (previous ABI's Rt=1.19 and Lt=0.99) Duplex ultrasound of the the patient has biphasic waveforms in the right tibial arteries with biphasic/triphasic waveforms in the left tibial arteries with good toe waveforms bilaterally.   Review of Systems  Cardiovascular: Positive for leg swelling.  Musculoskeletal: Positive for gait problem.  All other systems reviewed and are negative.      Objective:   Physical Exam Vitals reviewed.  HENT:     Head: Normocephalic.  Cardiovascular:     Rate and Rhythm: Normal rate and regular rhythm.     Pulses: Normal pulses.  Skin:    General: Skin is warm.  Neurological:     Mental Status: She is alert and oriented to person, place, and time.  Psychiatric:        Mood and Affect: Mood normal.        Behavior: Behavior normal.        Thought Content: Thought content normal.        Judgment: Judgment normal.     BP 123/75 (BP Location: Right Arm)   Pulse 89   Resp 16   Wt 179 lb (81.2 kg)   BMI 28.89 kg/m   Past Medical History:  Diagnosis Date  . Anxiety   . Arthritis   . Broken arm    left FA 3/17  .  Broken arm    left  . CAD (coronary artery disease)    s/p MI  . Cancer ([place])    uterine  . Cervical disc disorder   . Depression   . Diabetes mellitus without complication ([place])   . GERD (gastroesophageal reflux disease)   . Headache   . Hyperlipidemia   . Hypertension   . Hypothyroid   . Myocardial infarction ([place])    15 year ago  . Neck pain 06/04/2014  . Parkinson's disease ([place])   . Presence of permanent cardiac pacemaker   . Spinal stenosis     Social History   Socioeconomic History  . Marital status: Widowed    Spouse name: Not on file  . Number of children: Not on file  . Years of education: Not on file  . Highest education level: Not on file  Occupational History  . Occupation: retired  Tobacco Use  . Smoking status: Never Smoker  . Smokeless tobacco: Never Used  Vaping Use  . Vaping Use: Never used  Substance and Sexual Activity  . Alcohol use: No  . Drug use: No  . Sexual activity: Never  Other Topics Concern  . Not on file  Social History Narrative  . Not on file   Social Determinants of Health  Financial Resource Strain:   . Difficulty of Paying Living Expenses: Not on file  Food Insecurity:   . Worried About Charity fundraiser in the Last Year: Not on file  . Ran Out of Food in the Last Year: Not on file  Transportation Needs:   . Lack of Transportation (Medical): Not on file  . Lack of Transportation (Non-Medical): Not on file  Physical Activity:   . Days of Exercise per Week: Not on file  . Minutes of Exercise per Session: Not on file  Stress:   . Feeling of Stress : Not on file  Social Connections:   . Frequency of Communication with Friends and Family: Not on file  . Frequency of Social Gatherings with Friends and Family: Not on file  . Attends Religious Services: Not on file  . Active Member of Clubs or Organizations: Not on file  . Attends Archivist Meetings: Not on file  . Marital Status: Not on file  Intimate  Partner Violence:   . Fear of Current or Ex-Partner: Not on file  . Emotionally Abused: Not on file  . Physically Abused: Not on file  . Sexually Abused: Not on file    Past Surgical History:  Procedure Laterality Date  . ABDOMINAL HYSTERECTOMY     partial  . BREAST SURGERY    . CARDIOVERSION N/A 10/24/2019   Procedure: CARDIOVERSION;  Surgeon: Wellington Hampshire, MD;  Location: [facility] ORS;  Service: Cardiovascular;  Laterality: N/A;  . COLONOSCOPY    . COLONOSCOPY WITH PROPOFOL N/A 11/29/2017   Procedure: COLONOSCOPY WITH PROPOFOL;  Surgeon: [name], MD;  Location: [facility] ENDOSCOPY;  Service: Endoscopy;  Laterality: N/A;  . CORONARY ANGIOPLASTY     stent  . CORONARY ARTERY BYPASS GRAFT    . CORONARY STENT PLACEMENT    . ESOPHAGOGASTRODUODENOSCOPY (EGD) WITH PROPOFOL N/A 04/18/2017   Procedure: ESOPHAGOGASTRODUODENOSCOPY (EGD) WITH PROPOFOL;  Surgeon: [name], MD;  Location: [facility] ENDOSCOPY;  Service: Endoscopy;  Laterality: N/A;  . ESOPHAGOGASTRODUODENOSCOPY (EGD) WITH PROPOFOL N/A 11/29/2017   Procedure: ESOPHAGOGASTRODUODENOSCOPY (EGD) WITH PROPOFOL;  Surgeon: [name], MD;  Location: [facility] ENDOSCOPY;  Service: Endoscopy;  Laterality: N/A;  . INSERT / REPLACE / REMOVE PACEMAKER    . PPM GENERATOR CHANGEOUT N/A 09/05/2019   Procedure: PPM GENERATOR CHANGEOUT;  Surgeon: [name], MD;  Location: [place] CV LAB;  Service: Cardiovascular;  Laterality: N/A;  . s/p pacer insertion    . TRANSANAL EXCISION OF RECTAL MASS N/A 01/17/2018   Procedure: TRANSANAL EXCISION OF RECTAL POLYP;  Surgeon: [name], MD;  Location: [facility] ORS;  Service: General;  Laterality: N/A;    Family History  Problem Relation Age of Onset  . Cancer Mother   . Depression Mother   . Diabetes Mother   . Hypertension Mother   . Aneurysm Mother   . Heart disease Father   . Diabetes Sister   . Diabetes Brother   . Diabetes Brother     Allergies  Allergen Reactions  .  Penicillins Anaphylaxis, Swelling and Rash    TOLERATES CEFTRIAXONE       Assessment & Plan:   1. Atherosclerosis of native arteries of the extremities with ulceration ([place])  Recommend:  The patient has evidence of atherosclerosis of the lower extremities with claudication.  The patient does not voice lifestyle limiting changes at this point in time.  Noninvasive studies do not suggest clinically significant change.  No invasive studies, angiography or surgery  at this time The patient should continue walking and begin a more formal exercise program.  The patient should continue antiplatelet therapy and aggressive treatment of the lipid abnormalities  No changes in the patient's medications at this time  The patient should continue wearing graduated compression socks 10-15 mmHg strength to control the mild edema.    2. Essential hypertension Continue antihypertensive medications as already ordered, these medications have been reviewed and there are no changes at this time.   3. Lymphedema No surgery or intervention at this point in time.    I have reviewed my discussion with the patient regarding venous insufficiency and secondary lymph edema and why it  causes symptoms. I have discussed with the patient the chronic skin changes that accompany these problems and the long term sequela such as ulceration and infection.  Patient will continue wearing graduated compression stockings class 1 (20-30 mmHg) on a daily basis a prescription was given to the patient to keep this updated. The patient will  put the stockings on first thing in the morning and removing them in the evening. The patient is instructed specifically not to sleep in the stockings.  In addition, behavioral modification including elevation during the day will be continued.  Diet and salt restriction was also discussed.  Previous duplex ultrasound of the lower extremities shows normal deep venous system, superficial reflux  was not present.   Following the review of the ultrasound the patient will follow up in 12 months to reassess the degree of swelling and the control that graduated compression is offering.     4. Mixed hyperlipidemia Continue statin as ordered and reviewed, no changes at this time    Current Outpatient Medications on File Prior to Visit  Medication Sig Dispense Refill  . albuterol (VENTOLIN HFA) 108 (90 Base) MCG/ACT inhaler Inhale 2 puffs into the lungs every 6 (six) hours as needed for wheezing or shortness of breath. 8 g 0  . amiodarone (PACERONE) 200 MG tablet Take 1 tablet (200 mg total) by mouth daily. 90 tablet 2  . apixaban (ELIQUIS) 5 MG TABS tablet Take 1 tablet (5 mg total) by mouth 2 (two) times daily. 60 tablet 0  . cholecalciferol (VITAMIN D3) 25 MCG (1000 UNIT) tablet Take 1,000 Units by mouth daily.    . cholestyramine (QUESTRAN) 4 g packet Take 1 packet (4 g total) by mouth daily. in water    . donepezil (ARICEPT) 10 MG tablet Take 10 mg by mouth daily.     . Ferrous Sulfate (SLOW FE) 142 (45 Fe) MG TBCR Take 1 tablet by mouth twice daily    . gabapentin (NEURONTIN) 300 MG capsule Take 1 capsule (300 mg total) by mouth 2 (two) times daily. Take 300 mg in the morning and 600 mg at night    . guaiFENesin (ROBITUSSIN) 100 MG/5ML SOLN Take 5 mLs (100 mg total) by mouth every 4 (four) hours as needed for cough or to loosen phlegm. 236 mL 0  . hydrocortisone (ANUSOL-HC) 25 MG suppository Place 1 suppository (25 mg total) rectally 2 (two) times daily. Okay to substitute generic 12 suppository 0  . insulin detemir (LEVEMIR) 100 UNIT/ML FlexPen Inject 5 Units into the skin every evening. 15 mL 0  . Insulin Pen Needle 31G X 8 MM MISC 1 Dose by Does not apply route at bedtime. 100 each 0  . levothyroxine (SYNTHROID) 200 MCG tablet Take 200 mcg by mouth daily.     [name] liver oil-zinc oxide (DESITIN)  40 % ointment Apply topically 2 (two) times daily. Apply to buttock and groin ares 56.7 g 0    . loratadine (CLARITIN) 10 MG tablet Take 10 mg by mouth daily.     . meclizine (ANTIVERT) 12.5 MG tablet Take 1 tablet (12.5 mg total) by mouth 2 (two) times daily as needed for dizziness. 30 tablet 0  . metoprolol succinate (TOPROL-XL) 25 MG 24 hr tablet Take 0.5 tablets (12.5 mg total) by mouth at bedtime. 30 tablet 0  . montelukast (SINGULAIR) 10 MG tablet Take 10 mg by mouth at bedtime.     . Multiple Vitamin (MULTIVITAMIN WITH MINERALS) TABS tablet Take 1 tablet by mouth daily.    . nitroGLYCERIN (NITROSTAT) 0.4 MG SL tablet Place 0.4 mg under the tongue every 5 (five) minutes as needed for chest pain.     [name] OLANZapine (ZYPREXA) 2.5 MG tablet Take 2.5 mg by mouth in the morning and at bedtime.     [name] omeprazole (PRILOSEC) 40 MG capsule Take 40 mg by mouth 2 (two) times daily.    . potassium chloride SA (KLOR-CON) 20 MEQ tablet Take 2 tablets (40 mEq total) by mouth daily. 60 tablet 0  . rosuvastatin (CRESTOR) 40 MG tablet Take 40 mg by mouth daily.    . silver sulfADIAZINE (SILVADENE) 1 % cream Apply 1 application topically 2 (two) times daily. Apply to left heel wound  (please help her with dressing supplies to purchase.  I was thinking a 4x4 on the wound and then she could use a piece of a Kerlex to hold it in place so one Kerlex would last for multiple dressings) 50 g 0  . torsemide (DEMADEX) 20 MG tablet Take 1 tablet (20 mg total) by mouth 2 (two) times daily. 30 tablet 0  . traMADol (ULTRAM) 50 MG tablet Take 50 mg by mouth 3 (three) times daily as needed for moderate pain.    . traZODone (DESYREL) 100 MG tablet Take 1 tablet (100 mg total) by mouth at bedtime. 30 tablet 0  . vitamin B-12 (CYANOCOBALAMIN) 1000 MCG tablet Take 1,000 mcg by mouth daily.    . vitamin E 180 MG (400 UNITS) capsule Take 400 Units by mouth daily.    . sertraline (ZOLOFT) 100 MG tablet Take 100 mg by mouth 2 (two) times daily.     No current facility-administered medications on file prior to visit.     There are no Patient Instructions on file for this visit. No follow-ups on file.   Kris Hartmann, NP

## 2019-12-05 ENCOUNTER — Ambulatory Visit (INDEPENDENT_AMBULATORY_CARE_PROVIDER_SITE_OTHER): Payer: Medicare Other | Admitting: *Deleted

## 2019-12-05 DIAGNOSIS — I440 Atrioventricular block, first degree: Secondary | ICD-10-CM | POA: Diagnosis not present

## 2019-12-05 LAB — CUP PACEART REMOTE DEVICE CHECK
Battery Remaining Longevity: 164 mo
Battery Voltage: 3.2 V
Brady Statistic AP VP Percent: 0.04 %
Brady Statistic AP VS Percent: 93.72 %
Brady Statistic AS VP Percent: 0 %
Brady Statistic AS VS Percent: 6.23 %
Brady Statistic RA Percent Paced: 93.81 %
Brady Statistic RV Percent Paced: 0.04 %
Date Time Interrogation Session: 20210915215212
Implantable Lead Implant Date: 20080806
Implantable Lead Implant Date: 20080806
Implantable Lead Location: 753859
Implantable Lead Location: 753860
Implantable Pulse Generator Implant Date: 20210617
Lead Channel Impedance Value: 437 Ohm
Lead Channel Impedance Value: 475 Ohm
Lead Channel Impedance Value: 570 Ohm
Lead Channel Impedance Value: 570 Ohm
Lead Channel Pacing Threshold Amplitude: 0.75 V
Lead Channel Pacing Threshold Amplitude: 0.875 V
Lead Channel Pacing Threshold Pulse Width: 0.4 ms
Lead Channel Pacing Threshold Pulse Width: 0.4 ms
Lead Channel Sensing Intrinsic Amplitude: 16.75 mV
Lead Channel Sensing Intrinsic Amplitude: 16.75 mV
Lead Channel Sensing Intrinsic Amplitude: 5 mV
Lead Channel Sensing Intrinsic Amplitude: 5 mV
Lead Channel Setting Pacing Amplitude: 1.75 V
Lead Channel Setting Pacing Amplitude: 2.5 V
Lead Channel Setting Pacing Pulse Width: 0.4 ms
Lead Channel Setting Sensing Sensitivity: 4 mV

## 2019-12-09 NOTE — Progress Notes (Signed)
Remote pacemaker transmission.   

## 2019-12-10 ENCOUNTER — Ambulatory Visit (INDEPENDENT_AMBULATORY_CARE_PROVIDER_SITE_OTHER): Payer: Medicare Other | Admitting: Podiatry

## 2019-12-10 ENCOUNTER — Other Ambulatory Visit: Payer: Self-pay

## 2019-12-10 DIAGNOSIS — M79675 Pain in left toe(s): Secondary | ICD-10-CM

## 2019-12-10 DIAGNOSIS — B351 Tinea unguium: Secondary | ICD-10-CM | POA: Diagnosis not present

## 2019-12-10 DIAGNOSIS — L989 Disorder of the skin and subcutaneous tissue, unspecified: Secondary | ICD-10-CM

## 2019-12-10 DIAGNOSIS — M79674 Pain in right toe(s): Secondary | ICD-10-CM | POA: Diagnosis not present

## 2019-12-10 NOTE — Progress Notes (Signed)
   SUBJECTIVE Patient presents to office today complaining of elongated, thickened nails that cause pain while ambulating in shoes.  She is unable to trim her own nails.  Patient also states that she has a painful symptomatic callus to the distal tip of the right third toe.  She would like to have it evaluated.  She also relates a history of fissuring with thickened skin to the plantar heel of the left heel.  Patient is here for further evaluation and treatment.  Past Medical History:  Diagnosis Date  . Anxiety   . Arthritis   . Broken arm    left FA 3/17  . Broken arm    left  . CAD (coronary artery disease)    s/p MI  . Cancer ([place])    uterine  . Cervical disc disorder   . Depression   . Diabetes mellitus without complication ([place])   . GERD (gastroesophageal reflux disease)   . Headache   . Hyperlipidemia   . Hypertension   . Hypothyroid   . Myocardial infarction ([place])    15 year ago  . Neck pain 06/04/2014  . Parkinson's disease ([place])   . Presence of permanent cardiac pacemaker   . Spinal stenosis     OBJECTIVE General Patient is awake, alert, and oriented x 3 and in no acute distress. Derm Skin is dry and supple bilateral. Negative open lesions or macerations. Remaining integument unremarkable. Nails are tender, long, thickened and dystrophic with subungual debris, consistent with onychomycosis, 1-5 bilateral. No signs of infection noted.  Hyperkeratotic callus tissue noted to the left plantar heel as well as the distal tip of the right third toe with a central nucleated core.  Tenderness to palpation Vasc  DP and PT pedal pulses palpable bilaterally. Temperature gradient within normal limits.  Neuro Epicritic and protective threshold sensation grossly intact bilaterally.  Musculoskeletal Exam No symptomatic pedal deformities noted bilateral. Muscular strength within normal limits.  ASSESSMENT 1. Onychodystrophic nails 1-5 bilateral with hyperkeratosis of nails.  2.  Onychomycosis of nail due to dermatophyte bilateral 3. Pain in foot bilateral 4.  Preulcerative callus/porokeratosis distal tip right third toe 5.  Hyperkeratotic callus with fissuring left heel  PLAN OF CARE 1. Patient evaluated today.  2. Instructed to maintain good pedal hygiene and foot care.  3. Mechanical debridement of nails 1-5 bilaterally performed using a nail nipper. Filed with dremel without incident.  4.  Excisional debridement of the hyperkeratotic preulcerative callus lesions was performed using a chisel blade and tissue nipper without incident or bleeding  5.  OTC therapeutic lotion provided today.  Apply daily  6.  Return to clinic in 3 mos.    Edrick Kins, DPM [facility]  Dr. Edrick Kins, [address]                                        [place], [place] [address]                Office [phone]  Fax [phone]

## 2019-12-13 ENCOUNTER — Encounter: Payer: Self-pay | Admitting: Cardiology

## 2019-12-13 ENCOUNTER — Ambulatory Visit (INDEPENDENT_AMBULATORY_CARE_PROVIDER_SITE_OTHER): Payer: Medicare Other | Admitting: Cardiology

## 2019-12-13 ENCOUNTER — Other Ambulatory Visit: Payer: Self-pay

## 2019-12-13 VITALS — BP 100/60 | HR 61 | Ht 66.0 in | Wt 186.5 lb

## 2019-12-13 DIAGNOSIS — I2510 Atherosclerotic heart disease of native coronary artery without angina pectoris: Secondary | ICD-10-CM

## 2019-12-13 DIAGNOSIS — Z950 Presence of cardiac pacemaker: Secondary | ICD-10-CM

## 2019-12-13 DIAGNOSIS — I480 Paroxysmal atrial fibrillation: Secondary | ICD-10-CM | POA: Diagnosis not present

## 2019-12-13 DIAGNOSIS — I5030 Unspecified diastolic (congestive) heart failure: Secondary | ICD-10-CM | POA: Diagnosis not present

## 2019-12-13 MED ORDER — TORSEMIDE 20 MG PO TABS: ORAL_TABLET | ORAL | 5 refills | Status: DC

## 2019-12-13 NOTE — Progress Notes (Signed)
Cardiology Office Note:    Date:  12/13/2019   ID:  Kristi [name], DOB Jul 06, 1940, MRN [id number]  PCP:  Martin Majestic, FNP  Cardiologist:  Kate Sable, MD  Electrophysiologist:  None   Referring MD: Lorelee Market, MD   Chief Complaint  Patient presents with  . other    [facility] follow up. Meds reviewed by the pt. verbally. Pt. c/o LE edema with 6 lb 8oz. weight gain since yesterday.     History of Present Illness:    Kristi [name] is a 79 y.o. female with a hx of diabetes, HFpEF, parox A. fib on Eliquis hypertension, hyperlipidemia, CAD, prior PCI x5 stents,CABGx3 over 15 years ago, permanent pacemaker, lymphedema who presents for follow-up.    She was admitted to the hospital last month on 10/2019 with pneumonia.  Noted to be in atrial fibrillation rapid ventricular response.  Successfully underwent DC cardioversion on 10/24/2019.  Her amiodarone was stopped due to GI issues.  Aspirin was also stopped due to some GI bleed.  Metoprolol succinate 12.5 mg daily was started, Eliquis was continued.  Patient still complains of weight gain, previously on torsemide 40 mg twice daily prior to admission.  This was decreased on discharge to 20 mg twice daily due to symptoms of dizziness and low blood pressures.  Otherwise is doing okay, with no new concerns at this time.  Prior notes  She sleeps on a hospital bed at home and has required to place it on an incline.  She also endorses lower extremity edema for which she uses Ace wraps. Patient has a history of bilateral chronic venous insufficiency in the deep vein system of the lower extremity, and lymphedema, being seen at the vein clinic.  Has compression stockings and lymphedema pump device prescribed by specialist.  She states gaining about 40 pounds over the past 4 months.  She takes Lasix 40 mg daily and goes to the bathroom frequently but does not feel like she is getting out enough fluid.  She denies any symptoms of chest pain.   She states having an MI and prior stents totaling 5 before getting a three-vessel CABG. she would like to establish care with the device clinic for frequent pacemaker checks.  Echocardiogram on 11/2018 showed low normal ejection fraction with EF 50 to 55%, pseudonormal diastolic function.  Stress Myoview in 10/07/2016 was normal. Pacemaker had inverted atrial and ventricular leads status post revision on 6/21.  Past Medical History:  Diagnosis Date  . Anxiety   . Arthritis   . Broken arm    left FA 3/17  . Broken arm    left  . CAD (coronary artery disease)    s/p MI  . Cancer ([place])    uterine  . Cervical disc disorder   . Depression   . Diabetes mellitus without complication ([place])   . GERD (gastroesophageal reflux disease)   . Headache   . Hyperlipidemia   . Hypertension   . Hypothyroid   . Myocardial infarction ([place])    15 year ago  . Neck pain 06/04/2014  . Parkinson's disease ([place])   . Presence of permanent cardiac pacemaker   . Spinal stenosis     Past Surgical History:  Procedure Laterality Date  . ABDOMINAL HYSTERECTOMY     partial  . BREAST SURGERY    . CARDIOVERSION N/A 10/24/2019   Procedure: CARDIOVERSION;  Surgeon: Wellington Hampshire, MD;  Location: [facility] ORS;  Service: Cardiovascular;  Laterality: N/A;  . COLONOSCOPY    .  COLONOSCOPY WITH PROPOFOL N/A 11/29/2017   Procedure: COLONOSCOPY WITH PROPOFOL;  Surgeon: [name], MD;  Location: [facility] ENDOSCOPY;  Service: Endoscopy;  Laterality: N/A;  . CORONARY ANGIOPLASTY     stent  . CORONARY ARTERY BYPASS GRAFT    . CORONARY STENT PLACEMENT    . ESOPHAGOGASTRODUODENOSCOPY (EGD) WITH PROPOFOL N/A 04/18/2017   Procedure: ESOPHAGOGASTRODUODENOSCOPY (EGD) WITH PROPOFOL;  Surgeon: [name], MD;  Location: [facility] ENDOSCOPY;  Service: Endoscopy;  Laterality: N/A;  . ESOPHAGOGASTRODUODENOSCOPY (EGD) WITH PROPOFOL N/A 11/29/2017   Procedure: ESOPHAGOGASTRODUODENOSCOPY (EGD) WITH PROPOFOL;  Surgeon: [name],  MD;  Location: [facility] ENDOSCOPY;  Service: Endoscopy;  Laterality: N/A;  . INSERT / REPLACE / REMOVE PACEMAKER    . PPM GENERATOR CHANGEOUT N/A 09/05/2019   Procedure: PPM GENERATOR CHANGEOUT;  Surgeon: [name], MD;  Location: [place] CV LAB;  Service: Cardiovascular;  Laterality: N/A;  . s/p pacer insertion    . TRANSANAL EXCISION OF RECTAL MASS N/A 01/17/2018   Procedure: TRANSANAL EXCISION OF RECTAL POLYP;  Surgeon: [name], MD;  Location: [facility] ORS;  Service: General;  Laterality: N/A;    Current Medications: Current Meds  Medication Sig  . albuterol (VENTOLIN HFA) 108 (90 Base) MCG/ACT inhaler Inhale 2 puffs into the lungs every 6 (six) hours as needed for wheezing or shortness of breath.  [name] amiodarone (PACERONE) 200 MG tablet Take 1 tablet (200 mg total) by mouth daily.  [name] apixaban (ELIQUIS) 5 MG TABS tablet Take 1 tablet (5 mg total) by mouth 2 (two) times daily.  . cholecalciferol (VITAMIN D3) 25 MCG (1000 UNIT) tablet Take 1,000 Units by mouth daily.  . cholestyramine (QUESTRAN) 4 g packet Take 1 packet (4 g total) by mouth daily. in water  . donepezil (ARICEPT) 10 MG tablet Take 10 mg by mouth daily.   . Ferrous Sulfate (SLOW FE) 142 (45 Fe) MG TBCR Take 1 tablet by mouth twice daily  . gabapentin (NEURONTIN) 300 MG capsule Take 1 capsule (300 mg total) by mouth 2 (two) times daily. Take 300 mg in the morning and 600 mg at night  . guaiFENesin (ROBITUSSIN) 100 MG/5ML SOLN Take 5 mLs (100 mg total) by mouth every 4 (four) hours as needed for cough or to loosen phlegm.  . hydrocortisone (ANUSOL-HC) 25 MG suppository Place 1 suppository (25 mg total) rectally 2 (two) times daily. Okay to substitute generic  . insulin detemir (LEVEMIR) 100 UNIT/ML FlexPen Inject 5 Units into the skin every evening.  . Insulin Pen Needle 31G X 8 MM MISC 1 Dose by Does not apply route at bedtime.  [name] levothyroxine (SYNTHROID) 200 MCG tablet Take 200 mcg by mouth daily.   [name] liver  oil-zinc oxide (DESITIN) 40 % ointment Apply topically 2 (two) times daily. Apply to buttock and groin ares  . loratadine (CLARITIN) 10 MG tablet Take 10 mg by mouth daily.   . meclizine (ANTIVERT) 12.5 MG tablet Take 1 tablet (12.5 mg total) by mouth 2 (two) times daily as needed for dizziness.  . metoprolol succinate (TOPROL-XL) 25 MG 24 hr tablet Take 0.5 tablets (12.5 mg total) by mouth at bedtime.  . montelukast (SINGULAIR) 10 MG tablet Take 10 mg by mouth at bedtime.   . Multiple Vitamin (MULTIVITAMIN WITH MINERALS) TABS tablet Take 1 tablet by mouth daily.  . nitroGLYCERIN (NITROSTAT) 0.4 MG SL tablet Place 0.4 mg under the tongue every 5 (five) minutes as needed for chest pain.   [name] OLANZapine (ZYPREXA) 2.5  MG tablet Take 2.5 mg by mouth in the morning and at bedtime.   [name] omeprazole (PRILOSEC) 40 MG capsule Take 40 mg by mouth 2 (two) times daily.  . potassium chloride SA (KLOR-CON) 20 MEQ tablet Take 2 tablets (40 mEq total) by mouth daily.  . rosuvastatin (CRESTOR) 40 MG tablet Take 40 mg by mouth daily.  . sertraline (ZOLOFT) 100 MG tablet Take 100 mg by mouth 2 (two) times daily.  . silver sulfADIAZINE (SILVADENE) 1 % cream Apply 1 application topically 2 (two) times daily. Apply to left heel wound  (please help her with dressing supplies to purchase.  I was thinking a 4x4 on the wound and then she could use a piece of a Kerlex to hold it in place so one Kerlex would last for multiple dressings)  . torsemide (DEMADEX) 20 MG tablet Take 2 tablets by mouth every AM, and take 1 tablet by mouth every PM.  . traMADol (ULTRAM) 50 MG tablet Take 50 mg by mouth 3 (three) times daily as needed for moderate pain.  . traZODone (DESYREL) 100 MG tablet Take 1 tablet (100 mg total) by mouth at bedtime.  . vitamin B-12 (CYANOCOBALAMIN) 1000 MCG tablet Take 1,000 mcg by mouth daily.  . vitamin E 180 MG (400 UNITS) capsule Take 400 Units by mouth daily.  . [DISCONTINUED] torsemide (DEMADEX) 20 MG  tablet Take 1 tablet (20 mg total) by mouth 2 (two) times daily.     Allergies:   Penicillins   Social History   Socioeconomic History  . Marital status: Widowed    Spouse name: Not on file  . Number of children: Not on file  . Years of education: Not on file  . Highest education level: Not on file  Occupational History  . Occupation: retired  Tobacco Use  . Smoking status: Never Smoker  . Smokeless tobacco: Never Used  Vaping Use  . Vaping Use: Never used  Substance and Sexual Activity  . Alcohol use: No  . Drug use: No  . Sexual activity: Never  Other Topics Concern  . Not on file  Social History Narrative  . Not on file   Social Determinants of Health   Financial Resource Strain:   . Difficulty of Paying Living Expenses: Not on file  Food Insecurity:   . Worried About Charity fundraiser in the Last Year: Not on file  . Ran Out of Food in the Last Year: Not on file  Transportation Needs:   . Lack of Transportation (Medical): Not on file  . Lack of Transportation (Non-Medical): Not on file  Physical Activity:   . Days of Exercise per Week: Not on file  . Minutes of Exercise per Session: Not on file  Stress:   . Feeling of Stress : Not on file  Social Connections:   . Frequency of Communication with Friends and Family: Not on file  . Frequency of Social Gatherings with Friends and Family: Not on file  . Attends Religious Services: Not on file  . Active Member of Clubs or Organizations: Not on file  . Attends Archivist Meetings: Not on file  . Marital Status: Not on file     Family History: The patient's family history includes Aneurysm in her mother; Cancer in her mother; Depression in her mother; Diabetes in her brother, brother, mother, and sister; Heart disease in her father; Hypertension in her mother.  ROS:   Please see the history of present illness.  All other systems reviewed and are negative.  EKGs/Labs/Other Studies Reviewed:     The following studies were reviewed today:   EKG:  EKG is  ordered today.  The ekg ordered today demonstrates atrial paced rhythm, heart rate 61  Recent Labs: 09/29/2019: B Natriuretic Peptide 359.9 10/25/2019: Platelets 131 10/27/2019: Magnesium 2.1 10/28/2019: BUN 16; Creatinine, Ser 1.05; Hemoglobin 8.5; Potassium 4.1; Sodium 138 11/19/2019: ALT 31; TSH 0.037  Recent Lipid Panel    Component Value Date/Time   CHOL 131 10/25/2019 0600   TRIG 78 10/25/2019 0600   HDL 72 10/25/2019 0600   CHOLHDL 1.8 10/25/2019 0600   VLDL 16 10/25/2019 0600   LDLCALC 43 10/25/2019 0600    Physical Exam:    VS:  BP 100/60 (BP Location: Right Arm, Patient Position: Sitting, Cuff Size: Normal)   Pulse 61   Ht 5\' 6"  (1.676 m)   Wt 186 lb 8 oz (84.6 kg)   SpO2 96%   BMI 30.10 kg/m     Wt Readings from Last 3 Encounters:  12/13/19 186 lb 8 oz (84.6 kg)  11/29/19 179 lb (81.2 kg)  11/19/19 181 lb (82.1 kg)     GEN:  Well nourished, well developed in no acute distress HEENT: Normal NECK: No JVD; No carotid bruits LYMPHATICS: No lymphadenopathy CARDIAC: RRR, no murmurs, rubs, gallops RESPIRATORY:  Clear to auscultation without rales, wheezing or rhonchi  ABDOMEN: Soft, non-tender, distended MUSCULOSKELETAL: 1+ lower extremity edema, worse on right SKIN: Warm and dry NEUROLOGIC:  Alert and oriented x 3 PSYCHIATRIC:  Normal affect   ASSESSMENT:    1. Heart failure with preserved ejection fraction, unspecified HF chronicity ([place])   2. Coronary artery disease involving native coronary artery of native heart without angina pectoris   3. Pacemaker   4. PAF (paroxysmal atrial fibrillation) ([facility])    PLAN:    In order of problems listed above:  1. Patient with hx of HFpEF, G2DD, EF 50-55%, has lower extremity edema and also weight gain.  EF 50 to 55%. Still has edema on my exam. Increase torsemide to 40 mg in the am and 20mg  pm. 2. History of CAD/CABG.  Eliquis and Crestor 40 mg. 3. History  of permanent pacemaker. Keep appointment with device clinic for checks.  Has appointment with EP next month 4. History of A. fib, EKG today shows paced rhythm.  Amiodarone was previously stopped due to GI issues and hypotension.  Continue Eliquis, Toprol-XL 12.5 mg daily  Follow up in 2 months  Total encounter 42 minutes  Greater than 50% was spent in counseling and coordination of care with the patient   This note was generated in part or whole with voice recognition software. Voice recognition is usually quite accurate but there are transcription errors that can and very often do occur. I apologize for any typographical errors that were not detected and corrected.  Medication Adjustments/Labs and Tests Ordered: Current medicines are reviewed at length with the patient today.  Concerns regarding medicines are outlined above.  Orders Placed This Encounter  Procedures  . EKG 12-Lead   Meds ordered this encounter  Medications  . torsemide (DEMADEX) 20 MG tablet    Sig: Take 2 tablets by mouth every AM, and take 1 tablet by mouth every PM.    Dispense:  45 tablet    Refill:  5    Patient Instructions  Medication Instructions:  Your physician has recommended you make the following change in your medication:   INCREASE  your torsemide (DEMADEX) 20 MG tablet: Take 2 tablets by mouth every AM, and take 1 tablet by mouth every PM.  *If you need a refill on your cardiac medications before your next appointment, please call your pharmacy*   Lab Work: None Ordered If you have labs (blood work) drawn today and your tests are completely normal, you will receive your results only by: [name] MyChart Message (if you have MyChart) OR . A paper copy in the mail If you have any lab test that is abnormal or we need to change your treatment, we will call you to review the results.   Testing/Procedures: None Ordered   Follow-Up: At [facility], you and your health needs are our priority.  As  part of our continuing mission to provide you with exceptional heart care, we have created designated Provider Care Teams.  These Care Teams include your primary Cardiologist (physician) and Advanced Practice Providers (APPs -  Physician Assistants and Nurse Practitioners) who all work together to provide you with the care you need, when you need it.  We recommend signing up for the patient portal called "MyChart".  Sign up information is provided on this After Visit Summary.  MyChart is used to connect with patients for Virtual Visits (Telemedicine).  Patients are able to view lab/test results, encounter notes, upcoming appointments, etc.  Non-urgent messages can be sent to your provider as well.   To learn more about what you can do with MyChart, go to [email or web address].    Your next appointment:   1st week of December   The format for your next appointment:   In Person  Provider:   Kate Sable, MD   Other Instructions      Signed, Kate Sable, MD  12/13/2019 4:23 PM    [place]

## 2019-12-13 NOTE — Patient Instructions (Signed)
Medication Instructions:  Your physician has recommended you make the following change in your medication:   INCREASE your torsemide (DEMADEX) 20 MG tablet: Take 2 tablets by mouth every AM, and take 1 tablet by mouth every PM.  *If you need a refill on your cardiac medications before your next appointment, please call your pharmacy*   Lab Work: None Ordered If you have labs (blood work) drawn today and your tests are completely normal, you will receive your results only by:  [place] (if you have MyChart) OR  A paper copy in the mail If you have any lab test that is abnormal or we need to change your treatment, we will call you to review the results.   Testing/Procedures: None Ordered   Follow-Up: At [facility], you and your health needs are our priority.  As part of our continuing mission to provide you with exceptional heart care, we have created designated Provider Care Teams.  These Care Teams include your primary Cardiologist (physician) and Advanced Practice Providers (APPs -  Physician Assistants and Nurse Practitioners) who all work together to provide you with the care you need, when you need it.  We recommend signing up for the patient portal called "MyChart".  Sign up information is provided on this After Visit Summary.  MyChart is used to connect with patients for Virtual Visits (Telemedicine).  Patients are able to view lab/test results, encounter notes, upcoming appointments, etc.  Non-urgent messages can be sent to your provider as well.   To learn more about what you can do with MyChart, go to [email or web address].    Your next appointment:   1st week of December   The format for your next appointment:   In Person  Provider:   Kate Sable, MD   Other Instructions

## 2019-12-26 ENCOUNTER — Emergency Department: Payer: Medicare Other

## 2019-12-26 ENCOUNTER — Other Ambulatory Visit: Payer: Self-pay

## 2019-12-26 ENCOUNTER — Encounter: Payer: Self-pay | Admitting: Emergency Medicine

## 2019-12-26 ENCOUNTER — Emergency Department
Admission: EM | Admit: 2019-12-26 | Discharge: 2019-12-26 | Disposition: A | Discharge status: Home / Self Care | Payer: Medicare Other | Attending: Emergency Medicine | Admitting: Emergency Medicine

## 2019-12-26 DIAGNOSIS — Y92019 Unspecified place in single-family (private) house as the place of occurrence of the external cause: Secondary | ICD-10-CM | POA: Diagnosis not present

## 2019-12-26 DIAGNOSIS — F0390 Unspecified dementia without behavioral disturbance: Secondary | ICD-10-CM | POA: Insufficient documentation

## 2019-12-26 DIAGNOSIS — E039 Hypothyroidism, unspecified: Secondary | ICD-10-CM | POA: Insufficient documentation

## 2019-12-26 DIAGNOSIS — I2510 Atherosclerotic heart disease of native coronary artery without angina pectoris: Secondary | ICD-10-CM | POA: Insufficient documentation

## 2019-12-26 DIAGNOSIS — E119 Type 2 diabetes mellitus without complications: Secondary | ICD-10-CM | POA: Insufficient documentation

## 2019-12-26 DIAGNOSIS — Z794 Long term (current) use of insulin: Secondary | ICD-10-CM | POA: Diagnosis not present

## 2019-12-26 DIAGNOSIS — Z8542 Personal history of malignant neoplasm of other parts of uterus: Secondary | ICD-10-CM | POA: Diagnosis not present

## 2019-12-26 DIAGNOSIS — Y92009 Unspecified place in unspecified non-institutional (private) residence as the place of occurrence of the external cause: Secondary | ICD-10-CM

## 2019-12-26 DIAGNOSIS — Z950 Presence of cardiac pacemaker: Secondary | ICD-10-CM | POA: Insufficient documentation

## 2019-12-26 DIAGNOSIS — Z79899 Other long term (current) drug therapy: Secondary | ICD-10-CM | POA: Insufficient documentation

## 2019-12-26 DIAGNOSIS — W19XXXA Unspecified fall, initial encounter: Secondary | ICD-10-CM | POA: Insufficient documentation

## 2019-12-26 DIAGNOSIS — S60212A Contusion of left wrist, initial encounter: Secondary | ICD-10-CM | POA: Diagnosis not present

## 2019-12-26 DIAGNOSIS — S6992XA Unspecified injury of left wrist, hand and finger(s), initial encounter: Secondary | ICD-10-CM | POA: Diagnosis present

## 2019-12-26 DIAGNOSIS — I5033 Acute on chronic diastolic (congestive) heart failure: Secondary | ICD-10-CM | POA: Insufficient documentation

## 2019-12-26 DIAGNOSIS — Z7901 Long term (current) use of anticoagulants: Secondary | ICD-10-CM | POA: Insufficient documentation

## 2019-12-26 DIAGNOSIS — I110 Hypertensive heart disease with heart failure: Secondary | ICD-10-CM | POA: Insufficient documentation

## 2019-12-26 MED ORDER — TRAMADOL HCL 50 MG PO TABS
50.0000 mg | ORAL_TABLET | Freq: Once | ORAL | Status: AC
  Administered 2019-12-26: 50 mg via ORAL
  Filled 2019-12-26: qty 1

## 2019-12-26 MED ORDER — TRAMADOL HCL 50 MG PO TABS
50.0000 mg | ORAL_TABLET | Freq: Three times a day (TID) | ORAL | 0 refills | Status: DC | PRN
Start: 2019-12-26 — End: 2020-05-21

## 2019-12-26 NOTE — ED Notes (Addendum)
Provider RS placed jones wrap to pt's injured arm (L).

## 2019-12-26 NOTE — ED Provider Notes (Signed)
[facility] Emergency Department Provider Note   ____________________________________________   First MD Initiated Contact with Patient 12/26/19 1420     (approximate)  I have reviewed the triage vital signs and the nursing notes.   HISTORY  Chief Complaint Fall and Wrist Pain    HPI Kristi [name] is a 79 y.o. female presents to the ED with complaint of left wrist pain.  Patient states that her knees gave out on her yesterday causing her to fall.  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  Patient denies any head injury or loss of consciousness.  Patient has continued to have pain to her left wrist and is unable to get any pain relief.  She rates her pain as 7 out of 10.       Past Medical History:  Diagnosis Date  . Anxiety   . Arthritis   . Broken arm    left FA 3/17  . Broken arm    left  . CAD (coronary artery disease)    s/p MI  . Cancer ([place])    uterine  . Cervical disc disorder   . Depression   . Diabetes mellitus without complication ([place])   . GERD (gastroesophageal reflux disease)   . Headache   . Hyperlipidemia   . Hypertension   . Hypothyroid   . Myocardial infarction ([place])    15 year ago  . Neck pain 06/04/2014  . Parkinson's disease ([place])   . Presence of permanent cardiac pacemaker   . Spinal stenosis     Patient Active Problem List   Diagnosis Date Noted  . Acute delirium   . HCAP (healthcare-associated pneumonia)   . Persistent atrial fibrillation ([place])   . Hypotension   . Hyponatremia   . Lobar pneumonia ([place]) 10/23/2019  . Atrial flutter ([place])   . Dizziness   . Atrial fibrillation status post cardioversion ([place]) 09/27/2019  . CHF (congestive heart failure) ([place]) 09/26/2019  . Acute on chronic diastolic heart failure ([place]) 09/25/2019  . Hypertensive urgency 09/25/2019  . Fluid overload 09/25/2019  . Excessive gas 05/14/2019  . Generalized bloating 05/14/2019   . Bright red blood per rectum 05/14/2019  . 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 Chest pain 11/20/2018  . Difficulty walking 11/19/2018  . Insomnia 11/15/2018  . Type 2 diabetes mellitus with hyperlipidemia ([place]) 11/15/2018  . Goals of care, counseling/discussion 02/06/2018  . 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 Rectal mass 01/05/2018  . Leg pain, bilateral 12/06/2017  . Lymphedema 07/10/2017  . Atherosclerosis of native arteries of the extremities with ulceration ([place]) 07/10/2017  . Stricture and stenosis of esophagus   . Dysphagia   . Syncope and collapse 04/11/2017  . Mild dementia ([place]) 01/24/2017  . Lumbar facet osteoarthritis (Bilateral) 01/17/2017  . Acute postoperative pain 01/17/2017  . Hypokalemia 12/24/2016  . Anxiety 12/07/2016  . 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 Cerebrovascular accident ([place]) 12/07/2016  . Chronic depression 12/07/2016  . Essential hypertension 12/07/2016  . Hypothyroidism 12/07/2016  . Mixed hyperlipidemia 12/07/2016  . Myocardial infarction ([place]) 12/07/2016  . TIA (transient ischemic attack) 11/17/2016  . Parkinson disease ([place]) 10/27/2016  . Tremor 10/27/2016  . Chronic knee pain (B) (R>L) 10/19/2016  . Ischemic chest pain ([place]) 09/29/2016  . Abnormal CT scan, lumbar spine 09/08/2016  . Lumbar foraminal stenosis (multilevel) 09/08/2016  . Fracture of clavicle  08/03/2016  . Polyneuropathy 05/17/2016  . Neurogenic pain 04/12/2016  . Chronic lower extremity pain (Bilateral) (R>L) 04/12/2016  . Chronic lower extremity radicular pain (Primary Source of Pain) (Bilateral) (R>L) 04/12/2016  . Lower extremity weakness 04/12/2016  . Chronic low back pain (Secondary source of pain) (Bilateral) (R>L) 04/12/2016  . Chronic wrist pain (Left) (secondary to fracture) 04/12/2016  . Lumbar spondylosis 04/12/2016  . Chronic pain syndrome 04/11/2016  . Long term current use of opiate analgesic  04/11/2016  . Long term prescription opiate use 04/11/2016  . Opiate use 04/11/2016  . Long term prescription benzodiazepine use 04/11/2016  . Lumbar radiculopathy (Multilevel) (Bilateral) 05/07/2015  . Lumbar facet syndrome (Bilateral) (R>L) 08/21/2014  . 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 Sacroiliac joint dysfunction (Bilateral) 08/13/2014  . Frequent falls 06/04/2014  . Memory loss 06/04/2014  . Anxiety and depression 08/22/2013  . CAD (coronary artery disease) 08/22/2013  . Therapeutic opioid-induced constipation (OIC) 08/22/2013  . GERD (gastroesophageal reflux disease) 08/22/2013    Past Surgical History:  Procedure Laterality Date  . ABDOMINAL HYSTERECTOMY     partial  . BREAST SURGERY    . CARDIOVERSION N/A 10/24/2019   Procedure: CARDIOVERSION;  Surgeon: Wellington Hampshire, MD;  Location: [facility] ORS;  Service: Cardiovascular;  Laterality: N/A;  . COLONOSCOPY    . COLONOSCOPY WITH PROPOFOL N/A 11/29/2017   Procedure: COLONOSCOPY WITH PROPOFOL;  Surgeon: [name], MD;  Location[facility] ENDOSCOPY;  Service: Endoscopy;  Laterality: N/A;  . CORONARY ANGIOPLASTY     stent  . CORONARY ARTERY BYPASS GRAFT    . CORONARY STENT PLACEMENT    . ESOPHAGOGASTRODUODENOSCOPY (EGD) WITH PROPOFOL N/A 04/18/2017   Procedure: ESOPHAGOGASTRODUODENOSCOPY (EGD) WITH PROPOFOL;  Surgeon: [name], MD;  Location: [facility] ENDOSCOPY;  Service: Endoscopy;  Laterality: N/A;  . ESOPHAGOGASTRODUODENOSCOPY (EGD) WITH PROPOFOL N/A 11/29/2017   Procedure: ESOPHAGOGASTRODUODENOSCOPY (EGD) WITH PROPOFOL;  Surgeon: [name], MD;  Location: [facility] ENDOSCOPY;  Service: Endoscopy;  Laterality: N/A;  . INSERT / REPLACE / REMOVE PACEMAKER    . PPM GENERATOR CHANGEOUT N/A 09/05/2019   Procedure: PPM GENERATOR CHANGEOUT;  Surgeon: [name], MD;  Location: [place] CV LAB;  Service: Cardiovascular;  Laterality: N/A;  . s/p pacer insertion    . TRANSANAL EXCISION OF  RECTAL MASS N/A 01/17/2018   Procedure: TRANSANAL EXCISION OF RECTAL POLYP;  Surgeon: [name], MD;  Location: [facility] ORS;  Service: General;  Laterality: N/A;    Prior to Admission medications   Medication Sig Start Date End Date Taking? Authorizing Provider  albuterol (VENTOLIN HFA) 108 (90 Base) MCG/ACT inhaler Inhale 2 puffs into the lungs every 6 (six) hours as needed for wheezing or shortness of breath. 10/28/19   Loletha Grayer, MD  amiodarone (PACERONE) 200 MG tablet Take 1 tablet (200 mg total) by mouth daily. 11/20/19   [name], MD  apixaban (ELIQUIS) 5 MG TABS tablet Take 1 tablet (5 mg total) by mouth 2 (two) times daily. 09/30/19   Jennye Boroughs, MD  cholecalciferol (VITAMIN D3) 25 MCG (1000 UNIT) tablet Take 1,000 Units by mouth daily.    [provider]  cholestyramine (QUESTRAN) 4 g packet Take 1 packet (4 g total) by mouth daily. in water 10/28/19   Loletha Grayer, MD  donepezil (ARICEPT) 10 MG tablet Take 10 mg by mouth daily.     [provider]  Ferrous Sulfate (SLOW FE) 142 (45 Fe) MG TBCR Take 1 tablet  by mouth twice daily 11/19/19   [name], MD  gabapentin (NEURONTIN) 300 MG capsule Take 1 capsule (300 mg total) by mouth 2 (two) times daily. Take 300 mg in the morning and 600 mg at night 10/28/19   Loletha Grayer, MD  hydrocortisone (ANUSOL-HC) 25 MG suppository Place 1 suppository (25 mg total) rectally 2 (two) times daily. Okay to substitute generic 10/28/19   Loletha Grayer, MD  insulin detemir (LEVEMIR) 100 UNIT/ML FlexPen Inject 5 Units into the skin every evening. 10/28/19   Wieting, Richard, MD  Insulin Pen Needle 31G X 8 MM MISC 1 Dose by Does not apply route at bedtime. 10/28/19   Loletha Grayer, MD  levothyroxine (SYNTHROID) 200 MCG tablet Take 200 mcg by mouth daily.     [provider]  liver oil-zinc oxide (DESITIN) 40 % ointment Apply topically 2 (two) times daily. Apply to buttock and groin ares 10/28/19   Loletha Grayer, MD  loratadine (CLARITIN) 10 MG tablet Take 10 mg by mouth daily.     [provider]  meclizine (ANTIVERT) 12.5 MG tablet Take 1 tablet (12.5 mg total) by mouth 2 (two) times daily as needed for dizziness. 09/30/19   Jennye Boroughs, MD  metoprolol succinate (TOPROL-XL) 25 MG 24 hr tablet Take 0.5 tablets (12.5 mg total) by mouth at bedtime. 10/28/19   Loletha Grayer, MD  montelukast (SINGULAIR) 10 MG tablet Take 10 mg by mouth at bedtime.  07/23/19   [provider]  Multiple Vitamin (MULTIVITAMIN WITH MINERALS) TABS tablet Take 1 tablet by mouth daily.    [provider]  nitroGLYCERIN (NITROSTAT) 0.4 MG SL tablet Place 0.4 mg under the tongue every 5 (five) minutes as needed for chest pain.     [provider]  OLANZapine (ZYPREXA) 2.5 MG tablet Take 2.5 mg by mouth in the morning and at bedtime.     [provider]  omeprazole (PRILOSEC) 40 MG capsule Take 40 mg by mouth 2 (two) times daily. 10/22/18   [provider]  potassium chloride SA (KLOR-CON) 20 MEQ tablet Take 2 tablets (40 mEq total) by mouth daily. 10/29/19   Loletha Grayer, MD  rosuvastatin (CRESTOR) 40 MG tablet Take 40 mg by mouth daily. 10/22/18   [provider]  sertraline (ZOLOFT) 100 MG tablet Take 100 mg by mouth 2 (two) times daily. 11/19/18 12/13/19  [provider]  silver sulfADIAZINE (SILVADENE) 1 % cream Apply 1 application topically 2 (two) times daily. Apply to left heel wound  (please help her with dressing supplies to purchase.  I was thinking a 4x4 on the wound and then she could use a piece of a Kerlex to hold it in place so one Kerlex would last for multiple dressings) 11/18/19   Schnier, Dolores Lory, MD  torsemide (DEMADEX) 20 MG tablet Take 2 tablets by mouth every AM, and take 1 tablet by mouth every PM. 12/13/19   Kate Sable, MD  traMADol (ULTRAM) 50 MG tablet Take 1 tablet (50 mg total) by mouth every 8 (eight) hours as needed for  moderate pain. 12/26/19   Johnn Hai, PA-C  traZODone (DESYREL) 100 MG tablet Take 1 tablet (100 mg total) by mouth at bedtime. 10/28/19   Loletha Grayer, MD  vitamin B-12 (CYANOCOBALAMIN) 1000 MCG tablet Take 1,000 mcg by mouth daily.    [provider]  vitamin E 180 MG (400 UNITS) capsule Take 400 Units by mouth daily.    [provider]  Allergies Penicillins  Family History  Problem Relation Age of Onset  . Cancer Mother   . Depression Mother   . Diabetes Mother   . Hypertension Mother   . Aneurysm Mother   . Heart disease Father   . Diabetes Sister   . Diabetes Brother   . Diabetes Brother     Social History Social History   Tobacco Use  . Smoking status: Never Smoker  . Smokeless tobacco: Never Used  Vaping Use  . Vaping Use: Never used  Substance Use Topics  . Alcohol use: No  . Drug use: No    Review of Systems Constitutional: No fever/chills Eyes: No visual changes. Cardiovascular: Denies chest pain. Respiratory: Denies shortness of breath. Gastrointestinal: No abdominal pain.  No nausea, no vomiting.  Genitourinary: Negative for dysuria. Musculoskeletal: Positive left wrist pain.  Negative for back pain. Skin: Negative for rash. Neurological: Negative for headaches, focal weakness or numbness.  ____________________________________________   PHYSICAL EXAM:  VITAL SIGNS: ED Triage Vitals  Enc Vitals Group     BP 12/26/19 1317 127/64     Pulse Rate 12/26/19 1317 60     Resp 12/26/19 1317 19     Temp 12/26/19 1317 99.5 F (37.5 C)     Temp Source 12/26/19 1317 Oral     SpO2 12/26/19 1317 100 %     Weight 12/26/19 1212 186 lb (84.4 kg)     Height 12/26/19 1212 5\' 6"  (1.676 m)     Head Circumference --      Peak Flow --      Pain Score 12/26/19 1212 7     Pain Loc --      Pain Edu? --      Excl. in [place]? --     Constitutional: Alert and oriented. Well appearing and in no acute distress. Eyes: Conjunctivae are normal.   Head: Atraumatic. Neck: No stridor.   Cardiovascular: Normal rate, regular rhythm. Grossly normal heart sounds.  Good peripheral circulation. Respiratory: Normal respiratory effort.  No retractions. Lungs CTAB. Gastrointestinal: Soft and nontender. No distention. Musculoskeletal: Examination of the left wrist there is moderate soft tissue edema along with evidence of osteoarthritis to the digits.  The soft tissue edema in the left wrist on exam was making it difficult to determine if there was high suspicion for fracture.  Range of motion is difficult secondary to patient's pain.  Skin is intact.  Markedly tender palpation of the distal radius and ulna.  Patient is able to move digits without difficulty and has evidence of osteoarthritis in her digits.  Motor sensory function intact.  Radial pulse is present. Neurologic:  Normal speech and language. No gross focal neurologic deficits are appreciated.  Skin:  Skin is warm, dry and intact.  Psychiatric: Mood and affect are normal. Speech and behavior are normal.  ____________________________________________   LABS (all labs ordered are listed, but only abnormal results are displayed)  Labs Reviewed - No data to display  RADIOLOGY I, Johnn Hai, personally viewed and evaluated these images (plain radiographs) as part of my medical decision making, as well as reviewing the written report by the radiologist.   Official radiology report(s): DG Wrist Complete Left  Result Date: 12/26/2019 CLINICAL DATA:  Left wrist pain after fall yesterday. EXAM: LEFT WRIST - COMPLETE 3+ VIEW COMPARISON:  March 14, 2015. FINDINGS: There is deformity of the distal left radius suggesting old healed fracture. No acute fracture or dislocation is noted. Moderate degenerative changes seen  involving the first carpometacarpal joint. No soft tissue abnormality is noted. 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 No acute fracture or  dislocation is noted. Electronically Signed   By: Marijo Conception M.D.   On: 12/26/2019 12:38    ____________________________________________   PROCEDURES  Procedure(s) performed (including Critical Care):  Procedures   ____________________________________________   INITIAL IMPRESSION / ASSESSMENT AND PLAN / ED COURSE  As part of my medical decision making, I reviewed the following data within the electronic MEDICAL RECORD NUMBER Notes from prior ED visits and [place] Controlled Substance Database  79 year old female presents to the ED with pain to her left wrist after falling at home due to her chronic arthritic knees.  She denies any head injury or loss of consciousness.  She has had continued pain in her left wrist and now with soft tissue edema.  Patient has history of osteoarthritis making it difficult to examine.  X-rays were negative for any acute bony injury but does mention the degenerative changes.  Patient was made aware.  A Jones wrap was placed on the patient's left wrist and patient was able to move her jewelry from her left hand.  Jewelry was placed by the patient inside her purse.  Patient was given a tramadol while in the ED for pain.  A prescription for the same was sent to her pharmacy as she has taken this in the past without any side effects.  Is to follow-up with Dr. Rudene Christians if any continued problems and encouraged to ice and elevate often.  ____________________________________________   FINAL CLINICAL IMPRESSION(S) / ED DIAGNOSES  Final diagnoses:  Contusion of left wrist, initial encounter  Fall in home, initial encounter     ED Discharge Orders         Ordered    traMADol (ULTRAM) 50 MG tablet  Every 8 hours PRN        12/26/19 1507          *Please note:  Kristi [name] was evaluated in Emergency Department on 12/26/2019 for the symptoms described in the history of present illness. She was evaluated in the context of the global COVID-19 pandemic, which  necessitated consideration that the patient might be at risk for infection with the SARS-CoV-2 virus that causes COVID-19. Institutional protocols and algorithms that pertain to the evaluation of patients at risk for COVID-19 are in a state of rapid change based on information released by regulatory bodies including the CDC and federal and state organizations. These policies and algorithms were followed during the patient's care in the ED.  Some ED evaluations and interventions may be delayed as a result of limited staffing during and the pandemic.*   Note:  This document was prepared using Dragon voice recognition software and may include unintentional dictation errors.    Johnn Hai, PA-C 12/26/19 1525    Nena Polio, MD 01/18/20 [phone]

## 2019-12-26 NOTE — Discharge Instructions (Addendum)
Follow-up with your primary care provider or Dr. Rudene Christians who is on-call for orthopedics if any continued problems or worsening of your wrist pain.  Ice and elevation to reduce swelling. Tramadol every 8 hours as needed for pain.  Be aware that this medication could cause drowsiness and increase your risk for falling.  You may wear the soft cast that was applied to your wrist for support and protection.

## 2019-12-26 NOTE — ED Triage Notes (Signed)
Pt reports her knees gave out on her yesterday and she fell hurting her left wrist. Pt concerned it is broke

## 2019-12-26 NOTE — ED Notes (Signed)
See triage note. Pt able to move injured arm but with inc in pain. Appropriate color. Cap refill <3.

## 2020-01-21 ENCOUNTER — Ambulatory Visit (INDEPENDENT_AMBULATORY_CARE_PROVIDER_SITE_OTHER): Payer: Medicare Other | Admitting: Internal Medicine

## 2020-01-21 ENCOUNTER — Encounter: Payer: Self-pay | Admitting: Internal Medicine

## 2020-01-21 ENCOUNTER — Other Ambulatory Visit: Payer: Self-pay

## 2020-01-21 VITALS — BP 110/68 | HR 66 | Ht 66.0 in | Wt 186.0 lb

## 2020-01-21 DIAGNOSIS — Z950 Presence of cardiac pacemaker: Secondary | ICD-10-CM | POA: Diagnosis not present

## 2020-01-21 DIAGNOSIS — I495 Sick sinus syndrome: Secondary | ICD-10-CM | POA: Diagnosis not present

## 2020-01-21 DIAGNOSIS — Z79899 Other long term (current) drug therapy: Secondary | ICD-10-CM

## 2020-01-21 DIAGNOSIS — E039 Hypothyroidism, unspecified: Secondary | ICD-10-CM

## 2020-01-21 DIAGNOSIS — I440 Atrioventricular block, first degree: Secondary | ICD-10-CM

## 2020-01-21 DIAGNOSIS — I7025 Atherosclerosis of native arteries of other extremities with ulceration: Secondary | ICD-10-CM

## 2020-01-21 DIAGNOSIS — I480 Paroxysmal atrial fibrillation: Secondary | ICD-10-CM

## 2020-01-21 DIAGNOSIS — I5042 Chronic combined systolic (congestive) and diastolic (congestive) heart failure: Secondary | ICD-10-CM

## 2020-01-21 MED ORDER — TORSEMIDE 20 MG PO TABS
ORAL_TABLET | ORAL | Status: DC
Start: 2020-01-21 — End: 2020-04-24

## 2020-01-21 NOTE — Patient Instructions (Signed)
Medication Instructions:  - Your physician has recommended you make the following change in your medication:   1) CHANGE torsemide 20 mg: - take 2 tablets (40 mg) once daily in the morning & - take 2 tablets (40 mg) on Mondays, Wednesdays, & Fridays at lunch time   *If you need a refill on your cardiac medications before your next appointment, please call your pharmacy*   Lab Work: - Your physician recommends that you have lab work today: TSH  If you have labs (blood work) drawn today and your tests are completely normal, you will receive your results only by:  [employer] (if you have MyChart) OR  A paper copy in the mail If you have any lab test that is abnormal or we need to change your treatment, we will call you to review the results.   Testing/Procedures: - none ordered   Follow-Up: At [facility], you and your health needs are our priority.  As part of our continuing mission to provide you with exceptional heart care, we have created designated Provider Care Teams.  These Care Teams include your primary Cardiologist (physician) and Advanced Practice Providers (APPs -  Physician Assistants and Nurse Practitioners) who all work together to provide you with the care you need, when you need it.  We recommend signing up for the patient portal called "MyChart".  Sign up information is provided on this After Visit Summary.  MyChart is used to connect with patients for Virtual Visits (Telemedicine).  Patients are able to view lab/test results, encounter notes, upcoming appointments, etc.  Non-urgent messages can be sent to your provider as well.   To learn more about what you can do with MyChart, go to [email or web address].    Your next appointment:   6 month(s)  The format for your next appointment:   In Person  Provider:   Virl Axe, MD   Other Instructions n/a

## 2020-01-21 NOTE — Progress Notes (Signed)
Patient Care Team: Martin Majestic, FNP as PCP - General (Family Medicine) Kate Sable, MD as PCP - Cardiology (Cardiology) Clent Jacks, RN as Registered Nurse   HPI  Kristi [name] is a 79 y.o. female Seen in follow-up for pacemaker implanted remotely 2008 with generator replacement 2014 ([facility]) found on evaluation 5/21 to have had atrial and ventricular lead inversions (i.e. wrong ports) underwent GEN change with lead.  Hx of CAD with prior CABG  and interval stenting  Hospitalized 8/21 for sepsis and pneumonia complicated by bleeding per rectum She continues to have some bleeding. Amiodarone was discontinued "first episode "although back in the notes however, she was noted to have A. fib in the 2020 timeframe and again on her device  Denies shortness of breath.  Chest pain.  Does have fullness in her chest and in her left breast and abdomen which she thinks is related to fluid.  Diet is replete of fluid.  She thinks she is currently taking Synthroid only 125 mcg a day (see below)  DATE TEST EF   7/18 Myoview  No ischemia ( by report)   1/19 Echo   65 %   5/21 Echo   50-55 % LAE-severe(4.43mm///68 ml/m2)) Pulm Htn PA >56  5/21 Myoview 55-65% Perfusion defect  Artifact v scar    Date Cr K TSH LFTs Hgb  9/20 1.1 3.9   12.4  7/21  1.05 4.1 5.933 47 8.5  8/21   0.037 31       Records and Results Reviewed   Past Medical History:  Diagnosis Date  . Anxiety   . Arthritis   . Broken arm    left FA 3/17  . Broken arm    left  . CAD (coronary artery disease)    s/p MI  . Cancer ([place])    uterine  . Cervical disc disorder   . Depression   . Diabetes mellitus without complication ([place])   . GERD (gastroesophageal reflux disease)   . Headache   . Hyperlipidemia   . Hypertension   . Hypothyroid   . Myocardial infarction ([place])    15 year ago  . Neck pain 06/04/2014  . Parkinson's disease ([place])   . Presence of permanent cardiac pacemaker    . Spinal stenosis     Past Surgical History:  Procedure Laterality Date  . ABDOMINAL HYSTERECTOMY     partial  . BREAST SURGERY    . CARDIOVERSION N/A 10/24/2019   Procedure: CARDIOVERSION;  Surgeon: Wellington Hampshire, MD;  Location: [facility] ORS;  Service: Cardiovascular;  Laterality: N/A;  . COLONOSCOPY    . COLONOSCOPY WITH PROPOFOL N/A 11/29/2017   Procedure: COLONOSCOPY WITH PROPOFOL;  Surgeon: [name], MD;  Location[facility] ENDOSCOPY;  Service: Endoscopy;  Laterality: N/A;  . CORONARY ANGIOPLASTY     stent  . CORONARY ARTERY BYPASS GRAFT    . CORONARY STENT PLACEMENT    . ESOPHAGOGASTRODUODENOSCOPY (EGD) WITH PROPOFOL N/A 04/18/2017   Procedure: ESOPHAGOGASTRODUODENOSCOPY (EGD) WITH PROPOFOL;  Surgeon: [name], MD;  Location: [facility] ENDOSCOPY;  Service: Endoscopy;  Laterality: N/A;  . ESOPHAGOGASTRODUODENOSCOPY (EGD) WITH PROPOFOL N/A 11/29/2017   Procedure: ESOPHAGOGASTRODUODENOSCOPY (EGD) WITH PROPOFOL;  Surgeon: [name], MD;  Location: [facility] ENDOSCOPY;  Service: Endoscopy;  Laterality: N/A;  . INSERT / REPLACE / REMOVE PACEMAKER    . PPM GENERATOR CHANGEOUT N/A 09/05/2019   Procedure: PPM GENERATOR CHANGEOUT;  Surgeon: [name], MD;  Location: [place]  CV LAB;  Service: Cardiovascular;  Laterality: N/A;  . s/p pacer insertion    . TRANSANAL EXCISION OF RECTAL MASS N/A 01/17/2018   Procedure: TRANSANAL EXCISION OF RECTAL POLYP;  Surgeon: [name], MD;  Location: [facility] ORS;  Service: General;  Laterality: N/A;    Current Meds  Medication Sig  . albuterol (VENTOLIN HFA) 108 (90 Base) MCG/ACT inhaler Inhale 2 puffs into the lungs every 6 (six) hours as needed for wheezing or shortness of breath.  [name] amiodarone (PACERONE) 200 MG tablet Take 1 tablet (200 mg total) by mouth daily.  [name] apixaban (ELIQUIS) 5 MG TABS tablet Take 1 tablet (5 mg total) by mouth 2 (two) times daily.  . cholecalciferol (VITAMIN D3) 25 MCG (1000 UNIT) tablet Take 1,000 Units  by mouth daily.  . cholestyramine (QUESTRAN) 4 g packet Take 1 packet (4 g total) by mouth daily. in water  . cholestyramine (QUESTRAN) 4 GM/DOSE powder Take by mouth.  . donepezil (ARICEPT) 10 MG tablet Take 10 mg by mouth daily.   . Ferrous Sulfate (SLOW FE) 142 (45 Fe) MG TBCR Take 1 tablet by mouth twice daily  . gabapentin (NEURONTIN) 300 MG capsule Take 1 capsule (300 mg total) by mouth 2 (two) times daily. Take 300 mg in the morning and 600 mg at night  . hydrocortisone (ANUSOL-HC) 25 MG suppository Place 1 suppository (25 mg total) rectally 2 (two) times daily. Okay to substitute generic  . hydrocortisone 2.5 % cream   . insulin detemir (LEVEMIR) 100 UNIT/ML FlexPen Inject 5 Units into the skin every evening.  . Insulin Pen Needle 31G X 8 MM MISC 1 Dose by Does not apply route at bedtime.  [name] levothyroxine (SYNTHROID) 125 MCG tablet Take 125 mcg by mouth daily.  [name] levothyroxine (SYNTHROID) 200 MCG tablet Take 200 mcg by mouth daily.   [name] LINZESS 72 MCG capsule Take 72 mcg by mouth every morning.  . liver oil-zinc oxide (DESITIN) 40 % ointment Apply topically 2 (two) times daily. Apply to buttock and groin ares  . loratadine (CLARITIN) 10 MG tablet Take 10 mg by mouth daily.   . meclizine (ANTIVERT) 12.5 MG tablet Take 1 tablet (12.5 mg total) by mouth 2 (two) times daily as needed for dizziness.  . metoprolol succinate (TOPROL-XL) 25 MG 24 hr tablet Take 0.5 tablets (12.5 mg total) by mouth at bedtime.  . montelukast (SINGULAIR) 10 MG tablet Take 10 mg by mouth at bedtime.   . Multiple Vitamin (MULTIVITAMIN WITH MINERALS) TABS tablet Take 1 tablet by mouth daily.  . nitroGLYCERIN (NITROSTAT) 0.4 MG SL tablet Place 0.4 mg under the tongue every 5 (five) minutes as needed for chest pain.   [name] OLANZapine (ZYPREXA) 2.5 MG tablet Take 2.5 mg by mouth in the morning and at bedtime.   [name] omeprazole (PRILOSEC) 40 MG capsule Take 40 mg by mouth 2 (two) times daily.  . pioglitazone (ACTOS) 30 MG  tablet Take 30 mg by mouth daily.  . potassium chloride SA (KLOR-CON) 20 MEQ tablet Take 2 tablets (40 mEq total) by mouth daily.  . rosuvastatin (CRESTOR) 40 MG tablet Take 40 mg by mouth daily.  . silver sulfADIAZINE (SILVADENE) 1 % cream Apply 1 application topically 2 (two) times daily. Apply to left heel wound  (please help her with dressing supplies to purchase.  I was thinking a 4x4 on the wound and then she could use a piece of a Kerlex to hold it in place so one [employer]  would last for multiple dressings)  . torsemide (DEMADEX) 20 MG tablet Take 2 tablets by mouth every AM, and take 1 tablet by mouth every PM.  . traMADol (ULTRAM) 50 MG tablet Take 1 tablet (50 mg total) by mouth every 8 (eight) hours as needed for moderate pain.  . traZODone (DESYREL) 100 MG tablet Take 1 tablet (100 mg total) by mouth at bedtime.  . vitamin B-12 (CYANOCOBALAMIN) 1000 MCG tablet Take 1,000 mcg by mouth daily.  . vitamin E 180 MG (400 UNITS) capsule Take 400 Units by mouth daily.    Allergies  Allergen Reactions  . Penicillins Anaphylaxis, Swelling and Rash    TOLERATES CEFTRIAXONE      Review of Systems negative except from HPI and PMH  Physical Exam BP 110/68 (BP Location: Left Arm, Patient Position: Sitting, Cuff Size: Normal)   Pulse 66   Ht 5\' 6"  (1.676 m)   Wt 186 lb (84.4 kg)   SpO2 93%   BMI 30.02 kg/m  Well developed and well nourished in no acute distress HENT normal Neck supple with JVP-flat Clear Device pocket well healed; without hematoma or erythema.  There is no tethering  Regular rate and rhythm, no  murmur Abd-soft with active BS No Clubbing cyanosis tr edema Skin-warm and dry A & Oriented  Grossly normal sensory and motor function  ECG atrial pacing at 66 Intervals 24/10/44  Assessment and  Plan  Sinus node dysfunction  First-degree AV block  Pacemaker-[employer]  Atrial fibrillation-persistent  Anemia-GI bleeding  Congestive heart failure  acute/chronic/diastolic  Diabetes  Hyperthyroidism-iatrogenic   intercurrent afib, longest spell about 5 hrs   Not quite sure what is going on with her thyroid replacement.  TSH levels were elevated July 2021 on Synthroid 200 her dose was increased to 325??  And she was hyperthyroid.  But she thinks she is only taking 125 now so we will recheck her TSH; have called her pharmacy.  Her Synthroid was decreased early October 2021  Has had recurrence of atrial fibrillation although relatively brief.  We will not initiate amiodarone therapy  Device assessment showed atrial under sensing at 2 mV sensitivity.  Was decreased to 0.9 mV  Some fullness in her chest and her abdomen.  Significant fluid intake.  Have her encouraged her to decrease.  We will change her diuretics from 40/20 daily (taking second dose at bedtime) to 40 daily and 40 every other day at the midday     Current medicines are reviewed at length with the patient today .  The patient does not  have concerns regarding medicines. \

## 2020-01-22 LAB — TSH: TSH: 0.954 u[IU]/mL (ref 0.450–4.500)

## 2020-01-23 ENCOUNTER — Encounter: Payer: Medicare Other | Admitting: Internal Medicine

## 2020-01-28 ENCOUNTER — Other Ambulatory Visit: Payer: Self-pay

## 2020-01-28 ENCOUNTER — Encounter: Payer: Self-pay | Admitting: Emergency Medicine

## 2020-01-28 ENCOUNTER — Inpatient Hospital Stay
Admission: EM | Admit: 2020-01-28 | Discharge: 2020-01-30 | DRG: 378 | Disposition: A | Discharge status: Home / Self Care | Payer: Medicare Other | Attending: Internal Medicine | Admitting: Internal Medicine

## 2020-01-28 ENCOUNTER — Emergency Department: Payer: Medicare Other

## 2020-01-28 DIAGNOSIS — I2510 Atherosclerotic heart disease of native coronary artery without angina pectoris: Secondary | ICD-10-CM | POA: Diagnosis present

## 2020-01-28 DIAGNOSIS — Z923 Personal history of irradiation: Secondary | ICD-10-CM

## 2020-01-28 DIAGNOSIS — K5731 Diverticulosis of large intestine without perforation or abscess with bleeding: Principal | ICD-10-CM | POA: Diagnosis present

## 2020-01-28 DIAGNOSIS — Z7901 Long term (current) use of anticoagulants: Secondary | ICD-10-CM

## 2020-01-28 DIAGNOSIS — G20 Parkinson's disease: Secondary | ICD-10-CM | POA: Diagnosis present

## 2020-01-28 DIAGNOSIS — E1142 Type 2 diabetes mellitus with diabetic polyneuropathy: Secondary | ICD-10-CM | POA: Diagnosis present

## 2020-01-28 DIAGNOSIS — F0390 Unspecified dementia without behavioral disturbance: Secondary | ICD-10-CM | POA: Diagnosis present

## 2020-01-28 DIAGNOSIS — E785 Hyperlipidemia, unspecified: Secondary | ICD-10-CM

## 2020-01-28 DIAGNOSIS — Z9189 Other specified personal risk factors, not elsewhere classified: Secondary | ICD-10-CM

## 2020-01-28 DIAGNOSIS — Z8542 Personal history of malignant neoplasm of other parts of uterus: Secondary | ICD-10-CM

## 2020-01-28 DIAGNOSIS — Z951 Presence of aortocoronary bypass graft: Secondary | ICD-10-CM

## 2020-01-28 DIAGNOSIS — E669 Obesity, unspecified: Secondary | ICD-10-CM | POA: Diagnosis present

## 2020-01-28 DIAGNOSIS — Z818 Family history of other mental and behavioral disorders: Secondary | ICD-10-CM

## 2020-01-28 DIAGNOSIS — K922 Gastrointestinal hemorrhage, unspecified: Secondary | ICD-10-CM | POA: Diagnosis not present

## 2020-01-28 DIAGNOSIS — F0280 Dementia in other diseases classified elsewhere without behavioral disturbance: Secondary | ICD-10-CM | POA: Diagnosis present

## 2020-01-28 DIAGNOSIS — Z23 Encounter for immunization: Secondary | ICD-10-CM

## 2020-01-28 DIAGNOSIS — D62 Acute posthemorrhagic anemia: Secondary | ICD-10-CM | POA: Diagnosis present

## 2020-01-28 DIAGNOSIS — Z79899 Other long term (current) drug therapy: Secondary | ICD-10-CM

## 2020-01-28 DIAGNOSIS — E876 Hypokalemia: Secondary | ICD-10-CM | POA: Diagnosis present

## 2020-01-28 DIAGNOSIS — I110 Hypertensive heart disease with heart failure: Secondary | ICD-10-CM | POA: Diagnosis present

## 2020-01-28 DIAGNOSIS — G894 Chronic pain syndrome: Secondary | ICD-10-CM | POA: Diagnosis present

## 2020-01-28 DIAGNOSIS — I959 Hypotension, unspecified: Secondary | ICD-10-CM | POA: Diagnosis present

## 2020-01-28 DIAGNOSIS — Y842 Radiological procedure and radiotherapy as the cause of abnormal reaction of the patient, or of later complication, without mention of misadventure at the time of the procedure: Secondary | ICD-10-CM | POA: Diagnosis present

## 2020-01-28 DIAGNOSIS — G8929 Other chronic pain: Secondary | ICD-10-CM | POA: Diagnosis present

## 2020-01-28 DIAGNOSIS — K219 Gastro-esophageal reflux disease without esophagitis: Secondary | ICD-10-CM | POA: Diagnosis present

## 2020-01-28 DIAGNOSIS — I5032 Chronic diastolic (congestive) heart failure: Secondary | ICD-10-CM | POA: Diagnosis present

## 2020-01-28 DIAGNOSIS — E039 Hypothyroidism, unspecified: Secondary | ICD-10-CM | POA: Diagnosis present

## 2020-01-28 DIAGNOSIS — Z6830 Body mass index (BMI) 30.0-30.9, adult: Secondary | ICD-10-CM

## 2020-01-28 DIAGNOSIS — Z833 Family history of diabetes mellitus: Secondary | ICD-10-CM

## 2020-01-28 DIAGNOSIS — Z8673 Personal history of transient ischemic attack (TIA), and cerebral infarction without residual deficits: Secondary | ICD-10-CM

## 2020-01-28 DIAGNOSIS — F32A Depression, unspecified: Secondary | ICD-10-CM | POA: Diagnosis present

## 2020-01-28 DIAGNOSIS — Z79890 Hormone replacement therapy (postmenopausal): Secondary | ICD-10-CM

## 2020-01-28 DIAGNOSIS — E782 Mixed hyperlipidemia: Secondary | ICD-10-CM | POA: Diagnosis present

## 2020-01-28 DIAGNOSIS — Z8249 Family history of ischemic heart disease and other diseases of the circulatory system: Secondary | ICD-10-CM

## 2020-01-28 DIAGNOSIS — Z800 Family history of malignant neoplasm of digestive organs: Secondary | ICD-10-CM

## 2020-01-28 DIAGNOSIS — Z20822 Contact with and (suspected) exposure to covid-19: Secondary | ICD-10-CM | POA: Diagnosis present

## 2020-01-28 DIAGNOSIS — F419 Anxiety disorder, unspecified: Secondary | ICD-10-CM | POA: Diagnosis present

## 2020-01-28 DIAGNOSIS — Z880 Allergy status to penicillin: Secondary | ICD-10-CM

## 2020-01-28 DIAGNOSIS — K627 Radiation proctitis: Secondary | ICD-10-CM | POA: Diagnosis present

## 2020-01-28 DIAGNOSIS — K625 Hemorrhage of anus and rectum: Secondary | ICD-10-CM | POA: Diagnosis not present

## 2020-01-28 DIAGNOSIS — I480 Paroxysmal atrial fibrillation: Secondary | ICD-10-CM | POA: Diagnosis present

## 2020-01-28 DIAGNOSIS — Z85048 Personal history of other malignant neoplasm of rectum, rectosigmoid junction, and anus: Secondary | ICD-10-CM

## 2020-01-28 DIAGNOSIS — K623 Rectal prolapse: Secondary | ICD-10-CM | POA: Diagnosis present

## 2020-01-28 DIAGNOSIS — I4891 Unspecified atrial fibrillation: Secondary | ICD-10-CM | POA: Diagnosis present

## 2020-01-28 DIAGNOSIS — I252 Old myocardial infarction: Secondary | ICD-10-CM

## 2020-01-28 DIAGNOSIS — F03A0 Unspecified dementia, mild, without behavioral disturbance, psychotic disturbance, mood disturbance, and anxiety: Secondary | ICD-10-CM | POA: Diagnosis present

## 2020-01-28 DIAGNOSIS — E1169 Type 2 diabetes mellitus with other specified complication: Secondary | ICD-10-CM

## 2020-01-28 DIAGNOSIS — Z794 Long term (current) use of insulin: Secondary | ICD-10-CM

## 2020-01-28 DIAGNOSIS — Z950 Presence of cardiac pacemaker: Secondary | ICD-10-CM

## 2020-01-28 LAB — HEMOGLOBIN AND HEMATOCRIT, BLOOD
HCT: 32 % — ABNORMAL LOW (ref 36.0–46.0)
Hemoglobin: 10.1 g/dL — ABNORMAL LOW (ref 12.0–15.0)

## 2020-01-28 LAB — COMPREHENSIVE METABOLIC PANEL
ALT: 21 U/L (ref 0–44)
AST: 28 U/L (ref 15–41)
Albumin: 3.7 g/dL (ref 3.5–5.0)
Alkaline Phosphatase: 87 U/L (ref 38–126)
Anion gap: 12 (ref 5–15)
BUN: 28 mg/dL — ABNORMAL HIGH (ref 8–23)
CO2: 30 mmol/L (ref 22–32)
Calcium: 9.1 mg/dL (ref 8.9–10.3)
Chloride: 97 mmol/L — ABNORMAL LOW (ref 98–111)
Creatinine, Ser: 1.22 mg/dL — ABNORMAL HIGH (ref 0.44–1.00)
GFR, Estimated: 45 mL/min — ABNORMAL LOW (ref >60)
Glucose, Bld: 160 mg/dL — ABNORMAL HIGH (ref 70–99)
Potassium: 3.5 mmol/L (ref 3.5–5.1)
Sodium: 139 mmol/L (ref 135–145)
Total Bilirubin: 0.6 mg/dL (ref 0.3–1.2)
Total Protein: 7 g/dL (ref 6.5–8.1)

## 2020-01-28 LAB — RESPIRATORY PANEL BY RT PCR (FLU A&B, COVID)
Influenza A by PCR: NEGATIVE
Influenza B by PCR: NEGATIVE
SARS Coronavirus 2 by RT PCR: NEGATIVE

## 2020-01-28 LAB — GLUCOSE, CAPILLARY: Glucose-Capillary: 252 mg/dL — ABNORMAL HIGH (ref 70–99)

## 2020-01-28 LAB — TYPE AND SCREEN
ABO/RH(D): O POS
Antibody Screen: NEGATIVE

## 2020-01-28 LAB — CBC
HCT: 31.1 % — ABNORMAL LOW (ref 36.0–46.0)
Hemoglobin: 10.2 g/dL — ABNORMAL LOW (ref 12.0–15.0)
MCH: 31 pg (ref 26.0–34.0)
MCHC: 32.8 g/dL (ref 30.0–36.0)
MCV: 94.5 fL (ref 80.0–100.0)
Platelets: 150 10*3/uL (ref 150–400)
RBC: 3.29 MIL/uL — ABNORMAL LOW (ref 3.87–5.11)
RDW: 16.7 % — ABNORMAL HIGH (ref 11.5–15.5)
WBC: 4.1 10*3/uL (ref 4.0–10.5)
nRBC: 0 % (ref 0.0–0.2)

## 2020-01-28 MED ORDER — AMIODARONE HCL 200 MG PO TABS: 200.0000 mg | ORAL_TABLET | Freq: Every day | ORAL | Status: DC

## 2020-01-28 MED ORDER — LEVOTHYROXINE SODIUM 50 MCG PO TABS
125.0000 ug | ORAL_TABLET | Freq: Every day | ORAL | Status: DC
  Administered 2020-01-29: 125 ug via ORAL
  Filled 2020-01-28: qty 3

## 2020-01-28 MED ORDER — CHOLESTYRAMINE 4 G PO PACK
4.0000 g | PACK | Freq: Every day | ORAL | Status: DC
  Administered 2020-01-29: 4 g via ORAL
  Filled 2020-01-28 (×2): qty 1

## 2020-01-28 MED ORDER — GABAPENTIN 300 MG PO CAPS
300.0000 mg | ORAL_CAPSULE | Freq: Every morning | ORAL | Status: DC
  Administered 2020-01-29: 300 mg via ORAL
  Filled 2020-01-28: qty 1

## 2020-01-28 MED ORDER — TRAZODONE HCL 100 MG PO TABS
100.0000 mg | ORAL_TABLET | Freq: Every day | ORAL | Status: DC
  Administered 2020-01-28 – 2020-01-29 (×2): 100 mg via ORAL
  Filled 2020-01-28 (×2): qty 1

## 2020-01-28 MED ORDER — LACTATED RINGERS IV BOLUS
1000.0000 mL | Freq: Once | INTRAVENOUS | Status: AC
  Administered 2020-01-28: 1000 mL via INTRAVENOUS

## 2020-01-28 MED ORDER — DONEPEZIL HCL 5 MG PO TABS
10.0000 mg | ORAL_TABLET | Freq: Every day | ORAL | Status: DC
  Administered 2020-01-28 – 2020-01-29 (×2): 10 mg via ORAL
  Filled 2020-01-28 (×4): qty 2

## 2020-01-28 MED ORDER — PNEUMOCOCCAL VAC POLYVALENT 25 MCG/0.5ML IJ INJ
0.5000 mL | INJECTION | INTRAMUSCULAR | Status: AC | PRN
  Administered 2020-01-30: 0.5 mL via INTRAMUSCULAR
  Filled 2020-01-28: qty 0.5

## 2020-01-28 MED ORDER — INSULIN ASPART 100 UNIT/ML ~~LOC~~ SOLN
0.0000 [IU] | Freq: Every day | SUBCUTANEOUS | Status: DC
  Administered 2020-01-28: 3 [IU] via SUBCUTANEOUS
  Filled 2020-01-28: qty 1

## 2020-01-28 MED ORDER — INSULIN ASPART 100 UNIT/ML ~~LOC~~ SOLN
0.0000 [IU] | Freq: Three times a day (TID) | SUBCUTANEOUS | Status: DC
  Administered 2020-01-30: 2 [IU] via SUBCUTANEOUS
  Filled 2020-01-28: qty 1

## 2020-01-28 MED ORDER — ACETAMINOPHEN 325 MG PO TABS
650.0000 mg | ORAL_TABLET | Freq: Four times a day (QID) | ORAL | Status: DC | PRN
  Administered 2020-01-29: 650 mg via ORAL
  Filled 2020-01-28: qty 2

## 2020-01-28 MED ORDER — SERTRALINE HCL 50 MG PO TABS
100.0000 mg | ORAL_TABLET | Freq: Two times a day (BID) | ORAL | Status: DC
  Administered 2020-01-28 – 2020-01-29 (×3): 100 mg via ORAL
  Filled 2020-01-28 (×4): qty 2

## 2020-01-28 MED ORDER — ACETAMINOPHEN 650 MG RE SUPP: 650.0000 mg | Freq: Four times a day (QID) | RECTAL | Status: DC | PRN

## 2020-01-28 MED ORDER — ONDANSETRON HCL 4 MG/2ML IJ SOLN: 4.0000 mg | Freq: Four times a day (QID) | INTRAMUSCULAR | Status: DC | PRN

## 2020-01-28 MED ORDER — IOHEXOL 350 MG/ML SOLN
80.0000 mL | Freq: Once | INTRAVENOUS | Status: AC | PRN
  Administered 2020-01-28: 80 mL via INTRAVENOUS

## 2020-01-28 MED ORDER — ONDANSETRON HCL 4 MG PO TABS: 4.0000 mg | ORAL_TABLET | Freq: Four times a day (QID) | ORAL | Status: DC | PRN

## 2020-01-28 MED ORDER — ACETAMINOPHEN 325 MG PO TABS: 650.0000 mg | ORAL_TABLET | Freq: Four times a day (QID) | ORAL | Status: DC | PRN

## 2020-01-28 MED ORDER — ROSUVASTATIN CALCIUM 10 MG PO TABS
40.0000 mg | ORAL_TABLET | Freq: Every day | ORAL | Status: DC
  Administered 2020-01-29: 40 mg via ORAL
  Filled 2020-01-28 (×2): qty 2
  Filled 2020-01-28: qty 4

## 2020-01-28 MED ORDER — GABAPENTIN 600 MG PO TABS
600.0000 mg | ORAL_TABLET | Freq: Every day | ORAL | Status: DC
  Administered 2020-01-28 – 2020-01-29 (×2): 600 mg via ORAL
  Filled 2020-01-28 (×2): qty 1

## 2020-01-28 MED ORDER — TORSEMIDE 20 MG PO TABS
40.0000 mg | ORAL_TABLET | Freq: Every day | ORAL | Status: DC
  Administered 2020-01-29: 40 mg via ORAL
  Filled 2020-01-28 (×2): qty 2

## 2020-01-28 MED ORDER — OLANZAPINE 2.5 MG PO TABS
2.5000 mg | ORAL_TABLET | Freq: Every day | ORAL | Status: DC
  Administered 2020-01-28: 2.5 mg via ORAL
  Filled 2020-01-28 (×2): qty 1

## 2020-01-28 MED ORDER — TRAMADOL HCL 50 MG PO TABS
50.0000 mg | ORAL_TABLET | Freq: Three times a day (TID) | ORAL | Status: DC | PRN
  Administered 2020-01-28: 50 mg via ORAL
  Filled 2020-01-28: qty 1

## 2020-01-28 MED ORDER — METOPROLOL SUCCINATE ER 25 MG PO TB24
12.5000 mg | ORAL_TABLET | Freq: Every day | ORAL | Status: DC
  Administered 2020-01-28 – 2020-01-29 (×2): 12.5 mg via ORAL
  Filled 2020-01-28 (×2): qty 1

## 2020-01-28 MED ORDER — INSULIN DETEMIR 100 UNIT/ML ~~LOC~~ SOLN
5.0000 [IU] | Freq: Every evening | SUBCUTANEOUS | Status: DC
  Administered 2020-01-28: 5 [IU] via SUBCUTANEOUS
  Filled 2020-01-28 (×3): qty 0.05

## 2020-01-28 MED ORDER — NITROGLYCERIN 0.4 MG SL SUBL: 0.4000 mg | SUBLINGUAL_TABLET | SUBLINGUAL | Status: DC | PRN

## 2020-01-28 NOTE — H&P (Signed)
History and Physical   [name] MCN:[id number] DOB: 1940/07/04 DOA: 01/28/2020  PCP: Martin Majestic, FNP  Outpatient Specialists: Multiple, Neurology (Dr. Gurney Maxin), [facility], physical medicine (Dr. Sharlet Salina) Patient coming from: GI clinic   I have personally briefly reviewed patient's old medical records in [place].  Chief Concern: BRBPR  HPI: Kristi [name] is a 79 y.o. female with medical history significant for arthritis, coronary artery disease, history of MI approximately 15 years ago, history of uterine cancer, depression, anxiety, insulin-dependent diabetes mellitus, hyperlipidemia, hypertension, hypothyroid, presence of permanent cardiac pacemaker, Parkinson's disease, spinal stenosis.  Kristi [name] presents to ED at the recommendation of her GI specialist for concerns of  bleeding from rectum for 2 weeks. She had an appointment to address rectal bleeding with gastroenterologist today, Dr. Stephens November, and was told to present to the ED for evaluation of GI bleeding. She states the bleeding is bright red and occasional black clots. She states she knows it's coming from her rectum because she wipes her anus and there is a lot of blood on the toilet paper.  She endorses frontal headache that she describes as 'achy', abdominal fullness, diarrhea x 7 this past Saturday, endorses 8 lb weight gain in two days. She was prescribed increase fluid pill and told to double in dosage due to weight gain. She doubled her fluid pill on Friday at 01/25/20, but didn't take any today. She endorses baseline bilateral knee pain requiring pain injections every 6 months.  ROS: denies vision changes, shortness of breast, chest pain, dysuria, hematuria, cough, fever, chills, unchanged baseline back pain, HI/SI, insomnia, appetite lost  Social history: denies tobacco use, etoh, and recreational drug use.   ED Course: Discussed with ED provider   Review of  Systems: As per HPI otherwise 10 point review of systems negative.   Assessment/Plan  Principal Problem:   Rectal bleed Active Problems:   Anxiety and depression   CAD (coronary artery disease)   Chronic knee pain (B) (R>L)   Anxiety   Cardiac pacemaker in situ   Hypothyroidism   Mixed hyperlipidemia   Mild dementia ([place])   Atrial fibrillation status post cardioversion [facility])   GI bleed   At risk for polypharmacy   Bleeding from rectum- -ED doctor consulted GI specialist -Recommended admission and GI specialist will follow -CBC in the a.m.  Anemia - at baseline, CBC in the a.m., checking anemia panel  Anxiety and depression-resumed home sertraline 100 mg twice daily, Zyprexa 2.5 mg nightly, trazodone 100 mg nightly  Insulin-dependent diabetes mellitus-resumed home detemir 5 units subcu nightly -Checking A1c -Insulin SSI -Holding home p.o. antiglycemic agents  History of CAD with prior CABG and interval stenting -denies chest pain at this time and no EKG changes -Crestor 40 mg daily  History of A. fib-on home Eliquis, she endorses compliance, holding due to GI bleed concern -Per cardiology note her amiodarone was stopped due to GI issues and hypotension -She was placed on metoprolol succinate 12.5 mg daily Heart failure preserved ejection fraction grade 2 diastolic dysfunction, EF 50 to 55%-home torsemide dosing is 40 mg in the a.m and 20 mg in the p.m. -Holding home Eliquis  Hypothyroid-levothyroxine 125 mcg daily in the a.m. 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 every 6 hours as needed for nausea Tension type headache-acetaminophen as needed  DVT prophylaxis: TED hose Code Status: Full code Diet: Clear liquids n.p.o. after midnight Family Communication: None at this time  Disposition Plan: Pending clinical course and GI evaluation Consults called: Gastroenterologist Admission status:  Observation with telemetry  Past Medical History:  Diagnosis Date  . Anxiety   . Arthritis   . Broken arm    left FA 3/17  . Broken arm    left  . CAD (coronary artery disease)    s/p MI  . Cancer ([place])    uterine  . Cervical disc disorder   . Depression   . Diabetes mellitus without complication ([place])   . GERD (gastroesophageal reflux disease)   . Headache   . Hyperlipidemia   . Hypertension   . Hypothyroid   . Myocardial infarction ([place])    15 year ago  . Neck pain 06/04/2014  . Parkinson's disease ([place])   . Presence of permanent cardiac pacemaker   . Spinal stenosis    Past Surgical History:  Procedure Laterality Date  . ABDOMINAL HYSTERECTOMY     partial  . BREAST SURGERY    . CARDIOVERSION N/A 10/24/2019   Procedure: CARDIOVERSION;  Surgeon: Wellington Hampshire, MD;  Location: [facility] ORS;  Service: Cardiovascular;  Laterality: N/A;  . COLONOSCOPY    . COLONOSCOPY WITH PROPOFOL N/A 11/29/2017   Procedure: COLONOSCOPY WITH PROPOFOL;  Surgeon: [name], MD;  Location: [facility] ENDOSCOPY;  Service: Endoscopy;  Laterality: N/A;  . CORONARY ANGIOPLASTY     stent  . CORONARY ARTERY BYPASS GRAFT    . CORONARY STENT PLACEMENT    . ESOPHAGOGASTRODUODENOSCOPY (EGD) WITH PROPOFOL N/A 04/18/2017   Procedure: ESOPHAGOGASTRODUODENOSCOPY (EGD) WITH PROPOFOL;  Surgeon: [name], MD;  Location: [facility] ENDOSCOPY;  Service: Endoscopy;  Laterality: N/A;  . ESOPHAGOGASTRODUODENOSCOPY (EGD) WITH PROPOFOL N/A 11/29/2017   Procedure: ESOPHAGOGASTRODUODENOSCOPY (EGD) WITH PROPOFOL;  Surgeon: [name], MD;  Location: [facility] ENDOSCOPY;  Service: Endoscopy;  Laterality: N/A;  . INSERT / REPLACE / REMOVE PACEMAKER    . PPM GENERATOR CHANGEOUT N/A 09/05/2019   Procedure: PPM GENERATOR CHANGEOUT;  Surgeon: [name], MD;  Location: [facility];  Service: Cardiovascular;  Laterality: N/A;  . s/p pacer insertion    . TRANSANAL EXCISION OF RECTAL MASS N/A 01/17/2018   Procedure:  TRANSANAL EXCISION OF RECTAL POLYP;  Surgeon: [name], MD;  Location: [facility] ORS;  Service: General;  Laterality: N/A;   Social History:  reports that she has never smoked. She has never used smokeless tobacco. She reports that she does not drink alcohol and does not use drugs.  Allergies  Allergen Reactions  . Penicillins Anaphylaxis, Swelling and Rash    TOLERATES CEFTRIAXONE   Family History  Problem Relation Age of Onset  . Cancer Mother   . Depression Mother   . Diabetes Mother   . Hypertension Mother   . Aneurysm Mother   . Heart disease Father   . Diabetes Sister   . Diabetes Brother   . Diabetes Brother    Family history: Family history reviewed and no family history of bleeding or GI bleeding. Mother had stomach cancer  Prior to Admission medications   Medication Sig Start Date End Date Taking? Authorizing Provider  albuterol (VENTOLIN HFA) 108 (90 Base) MCG/ACT inhaler Inhale 2 puffs into the lungs every 6 (six) hours as needed for wheezing or shortness of breath. 10/28/19   Loletha Grayer, MD  amiodarone (PACERONE) 200 MG tablet Take 1 tablet (200 mg total) by mouth daily. 11/20/19   [name], MD  apixaban (ELIQUIS) 5 MG TABS tablet Take 1 tablet (5  mg total) by mouth 2 (two) times daily. 09/30/19   Jennye Boroughs, MD  cholecalciferol (VITAMIN D3) 25 MCG (1000 UNIT) tablet Take 1,000 Units by mouth daily.    [provider]  cholestyramine (QUESTRAN) 4 g packet Take 1 packet (4 g total) by mouth daily. in water 10/28/19   Loletha Grayer, MD  cholestyramine Lucrezia Starch) 4 GM/DOSE powder Take by mouth. 10/29/19   [provider]  donepezil (ARICEPT) 10 MG tablet Take 10 mg by mouth daily.     [provider]  Ferrous Sulfate (SLOW FE) 142 (45 Fe) MG TBCR Take 1 tablet by mouth twice daily 11/19/19   [name], MD  gabapentin (NEURONTIN) 300 MG capsule Take 1 capsule (300 mg total) by mouth 2 (two) times daily. Take 300 mg in the  morning and 600 mg at night 10/28/19   Loletha Grayer, MD  hydrocortisone (ANUSOL-HC) 25 MG suppository Place 1 suppository (25 mg total) rectally 2 (two) times daily. Okay to substitute generic 10/28/19   Loletha Grayer, MD  hydrocortisone 2.5 % cream  01/15/20   [provider]  insulin detemir (LEVEMIR) 100 UNIT/ML FlexPen Inject 5 Units into the skin every evening. 10/28/19   Wieting, Richard, MD  Insulin Pen Needle 31G X 8 MM MISC 1 Dose by Does not apply route at bedtime. 10/28/19   Loletha Grayer, MD  levothyroxine (SYNTHROID) 125 MCG tablet Take 125 mcg by mouth daily. 12/25/19   [provider]  LINZESS 72 MCG capsule Take 72 mcg by mouth every morning. 12/04/19   [provider]  liver oil-zinc oxide (DESITIN) 40 % ointment Apply topically 2 (two) times daily. Apply to buttock and groin ares 10/28/19   Loletha Grayer, MD  loratadine (CLARITIN) 10 MG tablet Take 10 mg by mouth daily.     [provider]  meclizine (ANTIVERT) 12.5 MG tablet Take 1 tablet (12.5 mg total) by mouth 2 (two) times daily as needed for dizziness. 09/30/19   Jennye Boroughs, MD  metoprolol succinate (TOPROL-XL) 25 MG 24 hr tablet Take 0.5 tablets (12.5 mg total) by mouth at bedtime. 10/28/19   Loletha Grayer, MD  montelukast (SINGULAIR) 10 MG tablet Take 10 mg by mouth at bedtime.  07/23/19   [provider]  Multiple Vitamin (MULTIVITAMIN WITH MINERALS) TABS tablet Take 1 tablet by mouth daily.    [provider]  nitroGLYCERIN (NITROSTAT) 0.4 MG SL tablet Place 0.4 mg under the tongue every 5 (five) minutes as needed for chest pain.     [provider]  OLANZapine (ZYPREXA) 2.5 MG tablet Take 2.5 mg by mouth in the morning and at bedtime.     [provider]  omeprazole (PRILOSEC) 40 MG capsule Take 40 mg by mouth 2 (two) times daily. 10/22/18   [provider]  pioglitazone (ACTOS) 30 MG tablet Take 30 mg by mouth daily. 12/25/19   [provider]  potassium chloride SA (KLOR-CON) 20 MEQ tablet Take 2 tablets (40 mEq total) by mouth daily. 10/29/19   Loletha Grayer, MD  rosuvastatin (CRESTOR) 40 MG tablet Take 40 mg by mouth daily. 10/22/18   [provider]  sertraline (ZOLOFT) 100 MG tablet Take 100 mg by mouth 2 (two) times daily. 11/19/18 12/13/19  [provider]  silver sulfADIAZINE (SILVADENE) 1 % cream Apply 1 application topically 2 (two) times daily. Apply to left heel wound  (please help her with dressing supplies to purchase.  I was thinking a 4x4  on the wound and then she could use a piece of a Kerlex to hold it in place so one Kerlex would last for multiple dressings) 11/18/19   Schnier, Dolores Lory, MD  torsemide (DEMADEX) 20 MG tablet Take 2 tablets (40 mg) once daily in the AM & take 2 tablets (40 mg) on Mondays, Wednesdays, & Fridays at lunch time 01/21/20   [name], MD  traMADol (ULTRAM) 50 MG tablet Take 1 tablet (50 mg total) by mouth every 8 (eight) hours as needed for moderate pain. 12/26/19   Johnn Hai, PA-C  traZODone (DESYREL) 100 MG tablet Take 1 tablet (100 mg total) by mouth at bedtime. 10/28/19   Loletha Grayer, MD  vitamin B-12 (CYANOCOBALAMIN) 1000 MCG tablet Take 1,000 mcg by mouth daily.    [provider]  vitamin E 180 MG (400 UNITS) capsule Take 400 Units by mouth daily.    [provider]   Physical Exam: Vitals:   01/28/20 2000 01/28/20 2030 01/28/20 2139 01/28/20 2216  BP: (!) 155/77 (!) 125/96 121/60 (!) 171/69  Pulse: 63 64 (!) 59 69  Resp:   17 20  Temp:   97.6 F (36.4 C) (!) 97.5 F (36.4 C)  TempSrc:    Oral  SpO2:   99% 100%  Weight:      Height:       Constitutional: appears age appropriate, NAD, calm, comfortable Eyes: b/l pinpoint pupils, lids and conjunctivae normal ENMT: Mucous membranes are moist. Posterior pharynx clear of any exudate or lesions. Age-appropriate dentition. Hearing appropriate Neck: normal, supple, no  masses, no thyromegaly Respiratory: clear to auscultation bilaterally, no wheezing, no crackles. Normal respiratory effort. No accessory muscle use.  Cardiovascular: Regular rate and rhythm, no murmurs / rubs / gallops. No extremity edema. 2+ pedal pulses. No carotid bruits.  Abdomen: no tenderness, no masses palpated, no hepatosplenomegaly. Bowel sounds positive.  Musculoskeletal: no clubbing / cyanosis. No joint deformity upper and lower extremities. Good ROM, no contractures, no atrophy. Normal muscle tone.  Skin: no rashes, lesions, ulcers. No induration Neurologic: CN 2-12 grossly intact. Sensation intact. Strength 5/5 in all 4.  Psychiatric: Normal judgment and insight. Alert and oriented x 3. Normal mood.   EKG: Independently reviewed, showing normal sinus rhythm with a rate of 83, LVH.  Imaging on Admission: Personally reviewed and I agree with radiologist reading as below.  CT Angio Abd/Pel w/ and/or w/o  Result Date: 01/28/2020 CLINICAL DATA:  Gastrointestinal bleeding, abdominal pain, rectal bleeding for 2 weeks EXAM: CTA ABDOMEN AND PELVIS WITHOUT AND WITH CONTRAST TECHNIQUE: Multidetector CT imaging of the abdomen and pelvis was performed using the standard protocol during bolus administration of intravenous contrast. Multiplanar reconstructed images and MIPs were obtained and reviewed to evaluate the vascular anatomy. CONTRAST:  31mL OMNIPAQUE IOHEXOL 350 MG/ML SOLN COMPARISON:  10/23/2019 FINDINGS: VASCULAR Aorta: Normal caliber aorta without aneurysm, dissection, vasculitis or significant stenosis. Mild diffuse atherosclerosis. Celiac: Patent without evidence of aneurysm, dissection, vasculitis or significant stenosis. SMA: Patent without evidence of aneurysm, dissection, vasculitis or significant stenosis. Renals: Both renal arteries are patent without evidence of aneurysm, dissection, vasculitis, fibromuscular dysplasia or significant stenosis. Moderate atherosclerosis at the vessel  origins. IMA: Patent without evidence of aneurysm, dissection, vasculitis or significant stenosis. Inflow: Moderate atherosclerosis without focal stenosis, aneurysm, or dissection. Proximal Outflow: Bilateral common femoral and visualized portions of the superficial and profunda femoral arteries are patent without evidence of aneurysm, dissection, vasculitis or significant stenosis. Veins: No obvious venous  abnormality within the limitations of this arterial phase study. Review of the MIP images confirms the above findings. NON-VASCULAR Lower chest: No acute pleural or parenchymal lung disease. Small hiatal hernia. Hepatobiliary: No focal liver abnormality is seen. No gallstones, gallbladder wall thickening, or biliary dilatation. Pancreas: Unremarkable. No pancreatic ductal dilatation or surrounding inflammatory changes. Spleen: Normal in size without focal abnormality. Adrenals/Urinary Tract: Mild bilateral renal cortical atrophy. Otherwise the kidneys enhance normally. No urinary tract calculi or obstructive uropathy. The adrenals and bladder are unremarkable. Stomach/Bowel: No bowel obstruction or ileus. Normal appendix right lower quadrant. Diverticulosis of the distal colon without diverticulitis. High attenuation stool is seen within the distal colon, which limits evaluation for active gastrointestinal bleeding. There is no evidence of intraluminal contrast accumulation to suggest active gastrointestinal bleeding. There may be wall thickening along the left posterolateral aspect of the anal verge. Please correlate with physical exam. Lymphatic: No pathologic adenopathy. Reproductive: Status post hysterectomy. No adnexal masses. Other: No free fluid or free gas.  No abdominal wall hernia. Musculoskeletal: No acute or destructive bony lesions. Multilevel lumbar spondylosis. Reconstructed images demonstrate no additional findings. IMPRESSION: VASCULAR 1. No evidence of active gastrointestinal bleeding. High  attenuation stool within the distal colon does somewhat limit the evaluation. 2. Aortic Atherosclerosis (ICD10-I70.0). No evidence of significant stenosis, aneurysm, or dissection. NON-VASCULAR 1. Possible wall thickening along the left posterolateral aspect of the anal verge, poorly evaluated by CT. Correlation with physical exam recommended. 2. Diverticulosis without diverticulitis. 3. Small hiatal hernia. Electronically Signed   By: Randa Ngo M.D.   On: 01/28/2020 18:14   Labs on Admission: I have personally reviewed following labs  CBC: Recent Labs  Lab 01/28/20 1255 01/28/20 1715  WBC 4.1  --   HGB 10.2* 10.1*  HCT 31.1* 32.0*  MCV 94.5  --   PLT 150  --    Basic Metabolic Panel: Recent Labs  Lab 01/28/20 1255  NA 139  K 3.5  CL 97*  CO2 30  GLUCOSE 160*  BUN 28*  CREATININE 1.22*  CALCIUM 9.1   GFR: Estimated Creatinine Clearance: 40 mL/min (A) (by C-G formula based on SCr of 1.22 mg/dL (H)). Liver Function Tests: Recent Labs  Lab 01/28/20 1255  AST 28  ALT 21  ALKPHOS 87  BILITOT 0.6  PROT 7.0  ALBUMIN 3.7   CBG: Recent Labs  Lab 01/28/20 2226  GLUCAP 252*   Urine analysis:    Component Value Date/Time   COLORURINE STRAW (A) 10/08/2019 2008   APPEARANCEUR CLEAR (A) 10/08/2019 2008   APPEARANCEUR Clear 02/19/2014 2041   LABSPEC 1.005 10/08/2019 2008   LABSPEC 1.003 02/19/2014 2041   PHURINE 5.0 10/08/2019 2008   GLUCOSEU >=500 (A) 10/08/2019 2008   GLUCOSEU Negative 02/19/2014 2041   HGBUR NEGATIVE 10/08/2019 2008   BILIRUBINUR NEGATIVE 10/08/2019 2008   BILIRUBINUR negative 01/25/2018 1209   BILIRUBINUR Negative 02/19/2014 2041   [place] 10/08/2019 2008   PROTEINUR NEGATIVE 10/08/2019 2008   UROBILINOGEN 1.0 01/25/2018 1209   NITRITE NEGATIVE 10/08/2019 2008   LEUKOCYTESUR NEGATIVE 10/08/2019 2008   LEUKOCYTESUR Trace 02/19/2014 2041   Tyhesha Dutson N Leopoldo Mazzie D.O. [facility]  If 12AM-7AM, please contact overnight-coverage  provider If 7AM-7PM, please contact day coverage provider [email or web address]  01/28/2020, 11:50 PM

## 2020-01-28 NOTE — ED Notes (Signed)
Rainbow and type and screen sent to the lab.   

## 2020-01-28 NOTE — ED Triage Notes (Signed)
Patient to ER for c/o rectal bleeding x2 weeks with bright red blood and dark blood clots. Patient was sent to ER for further eval by [facility].

## 2020-01-28 NOTE — ED Provider Notes (Signed)
[facility] Emergency Department Provider Note   ____________________________________________   First MD Initiated Contact with Patient 01/28/20 1508     (approximate)  I have reviewed the triage vital signs and the nursing notes.   HISTORY  Chief Complaint Rectal Bleeding    HPI Kristi [name] is a 79 y.o. female with past medical history of hypertension, hyperlipidemia, diabetes, CAD status post CABG, atrial fibrillation on Eliquis, Parkinson disease, and chronic pain who presents to the ED complaining of rectal bleeding.  Patient reports that she has seen bright red blood coming from her rectum for the past week and a half.  She notices it constantly on her depends, which she has to change multiple times per day.  She also notes darker blood with clots when she goes to wipe.  She has developed diarrhea over the past 2 days that has been streaked with blood, but she denies any dark tarry stool.  She has felt nauseous without any vomiting, also endorses bilateral lower quadrant abdominal pain.  She has a history of rectal cancer and had a surgical procedure to have this removed 2 years ago, has not had any issues with it since then.  She currently takes Eliquis for her atrial fibrillation.        Past Medical History:  Diagnosis Date   Anxiety    Arthritis    Broken arm    left FA 3/17   Broken arm    left   CAD (coronary artery disease)    s/p MI   Cancer (HCC)    uterine   Cervical disc disorder    Depression    Diabetes mellitus without complication (HCC)    GERD (gastroesophageal reflux disease)    Headache    Hyperlipidemia    Hypertension    Hypothyroid    Myocardial infarction ([place])    15 year ago   Neck pain 06/04/2014   Parkinson's disease ([place])    Presence of permanent cardiac pacemaker    Spinal stenosis     Patient Active Problem List   Diagnosis Date Noted   Acute delirium    HCAP (healthcare-associated  pneumonia)    Persistent atrial fibrillation ([place])    Hypotension    Hyponatremia    Lobar pneumonia ([place]) 10/23/2019   Atrial flutter (HCC)    Dizziness    Atrial fibrillation status post cardioversion ([place]) 09/27/2019   CHF (congestive heart failure) ([place]) 09/26/2019   Acute on chronic diastolic heart failure ([place]) 09/25/2019   Hypertensive urgency 09/25/2019   Fluid overload 09/25/2019   Excessive gas 05/14/2019   Generalized bloating 05/14/2019   Bright red blood per rectum 05/14/2019   Major depressive disorder, 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   Chest pain 11/20/2018   Difficulty walking 11/19/2018   Insomnia 11/15/2018   Type 2 diabetes mellitus with hyperlipidemia ([place]) 11/15/2018   Goals of care, counseling/discussion 02/06/2018   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   Rectal mass 01/05/2018   Leg pain, bilateral 12/06/2017   Lymphedema 07/10/2017   Atherosclerosis of native arteries of the extremities with ulceration ([place]) 07/10/2017   Stricture and stenosis of esophagus    Dysphagia    Syncope and collapse 04/11/2017   Mild dementia ([place]) 01/24/2017   Lumbar facet osteoarthritis (Bilateral) 01/17/2017   Acute postoperative pain 01/17/2017   Hypokalemia 12/24/2016   Anxiety 12/07/2016   Cardiac pacemaker in situ 12/07/2016   Cerebrovascular accident ([place]) 12/07/2016   Chronic depression  12/07/2016   Essential hypertension 12/07/2016   Hypothyroidism 12/07/2016   Mixed hyperlipidemia 12/07/2016   Myocardial infarction ([place]) 12/07/2016   TIA (transient ischemic attack) 11/17/2016   Parkinson disease ([place]) 10/27/2016   Tremor 10/27/2016   Chronic knee pain (B) (R>L) 10/19/2016   Ischemic chest pain ([place]) 09/29/2016   Abnormal CT scan, lumbar spine 09/08/2016   Lumbar foraminal stenosis (multilevel) 09/08/2016   Fracture of clavicle 08/03/2016   Polyneuropathy 05/17/2016   Neurogenic  pain 04/12/2016   Chronic lower extremity pain (Bilateral) (R>L) 04/12/2016   Chronic lower extremity radicular pain (Primary Source of Pain) (Bilateral) (R>L) 04/12/2016   Lower extremity weakness 04/12/2016   Chronic low back pain (Secondary source of pain) (Bilateral) (R>L) 04/12/2016   Chronic wrist pain (Left) (secondary to fracture) 04/12/2016   Lumbar spondylosis 04/12/2016   Chronic pain syndrome 04/11/2016   Long term current use of opiate analgesic 04/11/2016   Long term prescription opiate use 04/11/2016   Opiate use 04/11/2016   Long term prescription benzodiazepine use 04/11/2016   Lumbar radiculopathy (Multilevel) (Bilateral) 05/07/2015   Lumbar facet syndrome (Bilateral) (R>L) 08/21/2014   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, L3-4, L4-5) 08/13/2014   Sacroiliac joint dysfunction (Bilateral) 08/13/2014   Frequent falls 06/04/2014   Memory loss 06/04/2014   Anxiety and depression 08/22/2013   CAD (coronary artery disease) 08/22/2013   Therapeutic opioid-induced constipation (OIC) 08/22/2013   GERD (gastroesophageal reflux disease) 08/22/2013    Past Surgical History:  Procedure Laterality Date   ABDOMINAL HYSTERECTOMY     partial   BREAST SURGERY     CARDIOVERSION N/A 10/24/2019   Procedure: CARDIOVERSION;  Surgeon: Wellington Hampshire, MD;  Location: [facility] ORS;  Service: Cardiovascular;  Laterality: N/A;   COLONOSCOPY     COLONOSCOPY WITH PROPOFOL N/A 11/29/2017   Procedure: COLONOSCOPY WITH PROPOFOL;  Surgeon: [name], MD;  Location: [facility] ENDOSCOPY;  Service: Endoscopy;  Laterality: N/A;   CORONARY ANGIOPLASTY     stent   CORONARY ARTERY BYPASS GRAFT     CORONARY STENT PLACEMENT     ESOPHAGOGASTRODUODENOSCOPY (EGD) WITH PROPOFOL N/A 04/18/2017   Procedure: ESOPHAGOGASTRODUODENOSCOPY (EGD) WITH PROPOFOL;  Surgeon: [name], MD;  Location: [facility] ENDOSCOPY;  Service: Endoscopy;  Laterality: N/A;    ESOPHAGOGASTRODUODENOSCOPY (EGD) WITH PROPOFOL N/A 11/29/2017   Procedure: ESOPHAGOGASTRODUODENOSCOPY (EGD) WITH PROPOFOL;  Surgeon: [name], MD;  Location: [facility] ENDOSCOPY;  Service: Endoscopy;  Laterality: N/A;   INSERT / REPLACE / REMOVE PACEMAKER     PPM GENERATOR CHANGEOUT N/A 09/05/2019   Procedure: PPM GENERATOR CHANGEOUT;  Surgeon: [name], MD;  Location: [facility];  Service: Cardiovascular;  Laterality: N/A;   s/p pacer insertion     TRANSANAL EXCISION OF RECTAL MASS N/A 01/17/2018   Procedure: TRANSANAL EXCISION OF RECTAL POLYP;  Surgeon: [name], MD;  Location: [facility] ORS;  Service: General;  Laterality: N/A;    Prior to Admission medications   Medication Sig Start Date End Date Taking? Authorizing Provider  albuterol (VENTOLIN HFA) 108 (90 Base) MCG/ACT inhaler Inhale 2 puffs into the lungs every 6 (six) hours as needed for wheezing or shortness of breath. 10/28/19   Loletha Grayer, MD  amiodarone (PACERONE) 200 MG tablet Take 1 tablet (200 mg total) by mouth daily. 11/20/19   [name], MD  apixaban (ELIQUIS) 5 MG TABS tablet Take 1 tablet (5 mg total) by mouth 2 (two) times daily. 09/30/19   Jennye Boroughs, MD  cholecalciferol (VITAMIN  D3) 25 MCG (1000 UNIT) tablet Take 1,000 Units by mouth daily.    [provider]  cholestyramine (QUESTRAN) 4 g packet Take 1 packet (4 g total) by mouth daily. in water 10/28/19   Loletha Grayer, MD  cholestyramine Lucrezia Starch) 4 GM/DOSE powder Take by mouth. 10/29/19   [provider]  donepezil (ARICEPT) 10 MG tablet Take 10 mg by mouth daily.     [provider]  Ferrous Sulfate (SLOW FE) 142 (45 Fe) MG TBCR Take 1 tablet by mouth twice daily 11/19/19   [name], MD  gabapentin (NEURONTIN) 300 MG capsule Take 1 capsule (300 mg total) by mouth 2 (two) times daily. Take 300 mg in the morning and 600 mg at night 10/28/19   Loletha Grayer, MD  hydrocortisone (ANUSOL-HC) 25 MG  suppository Place 1 suppository (25 mg total) rectally 2 (two) times daily. Okay to substitute generic 10/28/19   Loletha Grayer, MD  hydrocortisone 2.5 % cream  01/15/20   [provider]  insulin detemir (LEVEMIR) 100 UNIT/ML FlexPen Inject 5 Units into the skin every evening. 10/28/19   Wieting, Richard, MD  Insulin Pen Needle 31G X 8 MM MISC 1 Dose by Does not apply route at bedtime. 10/28/19   Loletha Grayer, MD  levothyroxine (SYNTHROID) 125 MCG tablet Take 125 mcg by mouth daily. 12/25/19   [provider]  LINZESS 72 MCG capsule Take 72 mcg by mouth every morning. 12/04/19   [provider]  liver oil-zinc oxide (DESITIN) 40 % ointment Apply topically 2 (two) times daily. Apply to buttock and groin ares 10/28/19   Loletha Grayer, MD  loratadine (CLARITIN) 10 MG tablet Take 10 mg by mouth daily.     [provider]  meclizine (ANTIVERT) 12.5 MG tablet Take 1 tablet (12.5 mg total) by mouth 2 (two) times daily as needed for dizziness. 09/30/19   Jennye Boroughs, MD  metoprolol succinate (TOPROL-XL) 25 MG 24 hr tablet Take 0.5 tablets (12.5 mg total) by mouth at bedtime. 10/28/19   Loletha Grayer, MD  montelukast (SINGULAIR) 10 MG tablet Take 10 mg by mouth at bedtime.  07/23/19   [provider]  Multiple Vitamin (MULTIVITAMIN WITH MINERALS) TABS tablet Take 1 tablet by mouth daily.    [provider]  nitroGLYCERIN (NITROSTAT) 0.4 MG SL tablet Place 0.4 mg under the tongue every 5 (five) minutes as needed for chest pain.     [provider]  OLANZapine (ZYPREXA) 2.5 MG tablet Take 2.5 mg by mouth in the morning and at bedtime.     [provider]  omeprazole (PRILOSEC) 40 MG capsule Take 40 mg by mouth 2 (two) times daily. 10/22/18   [provider]  pioglitazone (ACTOS) 30 MG tablet Take 30 mg by mouth daily. 12/25/19   [provider]  potassium chloride SA (KLOR-CON) 20 MEQ tablet Take 2 tablets (40 mEq total) by  mouth daily. 10/29/19   Loletha Grayer, MD  rosuvastatin (CRESTOR) 40 MG tablet Take 40 mg by mouth daily. 10/22/18   [provider]  sertraline (ZOLOFT) 100 MG tablet Take 100 mg by mouth 2 (two) times daily. 11/19/18 12/13/19  [provider]  silver sulfADIAZINE (SILVADENE) 1 % cream Apply 1 application topically 2 (two) times daily. Apply to left heel wound  (please help her with dressing supplies to purchase.  I was thinking a 4x4 on the wound and then she could use a piece of a Kerlex to hold it in  place so one Kerlex would last for multiple dressings) 11/18/19   Schnier, Dolores Lory, MD  torsemide (DEMADEX) 20 MG tablet Take 2 tablets (40 mg) once daily in the AM & take 2 tablets (40 mg) on Mondays, Wednesdays, & Fridays at lunch time 01/21/20   [name], MD  traMADol (ULTRAM) 50 MG tablet Take 1 tablet (50 mg total) by mouth every 8 (eight) hours as needed for moderate pain. 12/26/19   Johnn Hai, PA-C  traZODone (DESYREL) 100 MG tablet Take 1 tablet (100 mg total) by mouth at bedtime. 10/28/19   Loletha Grayer, MD  vitamin B-12 (CYANOCOBALAMIN) 1000 MCG tablet Take 1,000 mcg by mouth daily.    [provider]  vitamin E 180 MG (400 UNITS) capsule Take 400 Units by mouth daily.    [provider]    Allergies Penicillins  Family History  Problem Relation Age of Onset   Cancer Mother    Depression Mother    Diabetes Mother    Hypertension Mother    Aneurysm Mother    Heart disease Father    Diabetes Sister    Diabetes Brother    Diabetes Brother     Social History Social History   Tobacco Use   Smoking status: Never Smoker   Smokeless tobacco: Never Used  Scientific laboratory technician Use: Never used  Substance Use Topics   Alcohol use: No   Drug use: No    Review of Systems  Constitutional: No fever/chills Eyes: No visual changes. ENT: No sore throat. Cardiovascular: Denies chest pain. Respiratory: Denies shortness  of breath. Gastrointestinal: Positive for abdominal pain and nausea, no vomiting.  Positive for diarrhea.  No constipation.  Positive for rectal bleeding. Genitourinary: Negative for dysuria. Musculoskeletal: Negative for back pain. Skin: Negative for rash. Neurological: Negative for headaches, focal weakness or numbness.  ____________________________________________   PHYSICAL EXAM:  VITAL SIGNS: ED Triage Vitals  Enc Vitals Group     BP 01/28/20 1254 105/71     Pulse Rate 01/28/20 1254 65     Resp 01/28/20 1254 18     Temp 01/28/20 1254 98.9 F (37.2 C)     Temp Source 01/28/20 1254 Oral     SpO2 01/28/20 1254 98 %     Weight 01/28/20 1255 190 lb (86.2 kg)     Height 01/28/20 1255 5\' 6"  (1.676 m)     Head Circumference --      Peak Flow --      Pain Score 01/28/20 1302 7     Pain Loc --      Pain Edu? --      Excl. in [place]? --     Constitutional: Alert and oriented. Eyes: Conjunctivae are normal. Head: Atraumatic. Nose: No congestion/rhinnorhea. Mouth/Throat: Mucous membranes are moist. Neck: Normal ROM Cardiovascular: Normal rate, regular rhythm. Grossly normal heart sounds. Respiratory: Normal respiratory effort.  No retractions. Lungs CTAB. Gastrointestinal: Soft and tender to palpation in the bilateral lower quadrants with no rebound or guarding. No distention.  Bright red blood noted on rectal exam with no obvious source. Genitourinary: deferred Musculoskeletal: No lower extremity tenderness nor edema. Neurologic:  Normal speech and language. No gross focal neurologic deficits are appreciated. Skin:  Skin is warm, dry and intact. No rash noted. Psychiatric: Mood and affect are normal. Speech and behavior are normal.  ____________________________________________   LABS (all labs ordered are listed, but only abnormal results are displayed)  Labs Reviewed  COMPREHENSIVE METABOLIC  PANEL - Abnormal; Notable for the following components:      Result Value    Chloride 97 (*)    Glucose, Bld 160 (*)    BUN 28 (*)    Creatinine, Ser 1.22 (*)    GFR, Estimated 45 (*)    All other components within normal limits  CBC - Abnormal; Notable for the following components:   RBC 3.29 (*)    Hemoglobin 10.2 (*)    HCT 31.1 (*)    RDW 16.7 (*)    All other components within normal limits  HEMOGLOBIN AND HEMATOCRIT, BLOOD - Abnormal; Notable for the following components:   Hemoglobin 10.1 (*)    HCT 32.0 (*)    All other components within normal limits  RESPIRATORY PANEL BY RT PCR (FLU A&B, COVID)  TYPE AND SCREEN    PROCEDURES  Procedure(s) performed (including Critical Care):  Procedures   ____________________________________________   INITIAL IMPRESSION / ASSESSMENT AND PLAN / ED COURSE       79 year old female with past medical history of hypertension, hyperlipidemia, diabetes, CAD status post CABG, atrial fibrillation on Eliquis, Parkinson disease, and chronic pain who presents to the ED complaining of a week and a half of rectal bleeding with onset of diarrhea 2 days ago.  She additionally endorses rectal discomfort and bilateral lower quadrant abdominal pain.  There is obvious rectal bleeding on exam but patient's H&H is stable compared to previous.  We will check CTA for active extravasation or recurrent rectal malignancy, hydrate with IV fluids and trend H&H.  Electrolytes are within normal limits at this time.  CTA shows no active extravasation, does show thickening at the anal verge which is not obvious on physical exam.  Case discussed with Dr. Bonna Gains of GI, who recommends admission given patient is anticoagulated with active bleeding.  Repeat H&H is stable.  Case discussed with hospitalist for admission.      ____________________________________________   FINAL CLINICAL IMPRESSION(S) / ED DIAGNOSES  Final diagnoses:  Lower GI bleed  Anticoagulated     ED Discharge Orders    None       Note:  This document was  prepared using Dragon voice recognition software and may include unintentional dictation errors.   Blake Divine, MD 01/28/20 1919

## 2020-01-28 NOTE — ED Notes (Signed)
Box lunch and drink provided.

## 2020-01-29 ENCOUNTER — Encounter: Payer: Self-pay | Admitting: Internal Medicine

## 2020-01-29 DIAGNOSIS — I2510 Atherosclerotic heart disease of native coronary artery without angina pectoris: Secondary | ICD-10-CM | POA: Diagnosis present

## 2020-01-29 DIAGNOSIS — Z23 Encounter for immunization: Secondary | ICD-10-CM | POA: Diagnosis present

## 2020-01-29 DIAGNOSIS — K922 Gastrointestinal hemorrhage, unspecified: Secondary | ICD-10-CM | POA: Diagnosis present

## 2020-01-29 DIAGNOSIS — F32A Depression, unspecified: Secondary | ICD-10-CM | POA: Diagnosis present

## 2020-01-29 DIAGNOSIS — G894 Chronic pain syndrome: Secondary | ICD-10-CM | POA: Diagnosis present

## 2020-01-29 DIAGNOSIS — F0280 Dementia in other diseases classified elsewhere without behavioral disturbance: Secondary | ICD-10-CM | POA: Diagnosis present

## 2020-01-29 DIAGNOSIS — Z6830 Body mass index (BMI) 30.0-30.9, adult: Secondary | ICD-10-CM | POA: Diagnosis not present

## 2020-01-29 DIAGNOSIS — G20 Parkinson's disease: Secondary | ICD-10-CM | POA: Diagnosis present

## 2020-01-29 DIAGNOSIS — I480 Paroxysmal atrial fibrillation: Secondary | ICD-10-CM | POA: Diagnosis present

## 2020-01-29 DIAGNOSIS — E782 Mixed hyperlipidemia: Secondary | ICD-10-CM | POA: Diagnosis present

## 2020-01-29 DIAGNOSIS — I110 Hypertensive heart disease with heart failure: Secondary | ICD-10-CM | POA: Diagnosis present

## 2020-01-29 DIAGNOSIS — Y842 Radiological procedure and radiotherapy as the cause of abnormal reaction of the patient, or of later complication, without mention of misadventure at the time of the procedure: Secondary | ICD-10-CM | POA: Diagnosis present

## 2020-01-29 DIAGNOSIS — E1142 Type 2 diabetes mellitus with diabetic polyneuropathy: Secondary | ICD-10-CM | POA: Diagnosis present

## 2020-01-29 DIAGNOSIS — K625 Hemorrhage of anus and rectum: Secondary | ICD-10-CM

## 2020-01-29 DIAGNOSIS — Z79899 Other long term (current) drug therapy: Secondary | ICD-10-CM | POA: Diagnosis not present

## 2020-01-29 DIAGNOSIS — Z950 Presence of cardiac pacemaker: Secondary | ICD-10-CM | POA: Diagnosis not present

## 2020-01-29 DIAGNOSIS — E669 Obesity, unspecified: Secondary | ICD-10-CM | POA: Diagnosis present

## 2020-01-29 DIAGNOSIS — K627 Radiation proctitis: Secondary | ICD-10-CM | POA: Diagnosis present

## 2020-01-29 DIAGNOSIS — E876 Hypokalemia: Secondary | ICD-10-CM | POA: Diagnosis present

## 2020-01-29 DIAGNOSIS — I252 Old myocardial infarction: Secondary | ICD-10-CM | POA: Diagnosis not present

## 2020-01-29 DIAGNOSIS — Z7901 Long term (current) use of anticoagulants: Secondary | ICD-10-CM | POA: Diagnosis not present

## 2020-01-29 DIAGNOSIS — K5731 Diverticulosis of large intestine without perforation or abscess with bleeding: Secondary | ICD-10-CM | POA: Diagnosis present

## 2020-01-29 DIAGNOSIS — I5032 Chronic diastolic (congestive) heart failure: Secondary | ICD-10-CM | POA: Diagnosis present

## 2020-01-29 DIAGNOSIS — Z20822 Contact with and (suspected) exposure to covid-19: Secondary | ICD-10-CM | POA: Diagnosis present

## 2020-01-29 DIAGNOSIS — E039 Hypothyroidism, unspecified: Secondary | ICD-10-CM | POA: Diagnosis present

## 2020-01-29 DIAGNOSIS — F419 Anxiety disorder, unspecified: Secondary | ICD-10-CM | POA: Diagnosis present

## 2020-01-29 DIAGNOSIS — D62 Acute posthemorrhagic anemia: Secondary | ICD-10-CM | POA: Diagnosis present

## 2020-01-29 LAB — COMPREHENSIVE METABOLIC PANEL
ALT: 18 U/L (ref 0–44)
AST: 23 U/L (ref 15–41)
Albumin: 3.2 g/dL — ABNORMAL LOW (ref 3.5–5.0)
Alkaline Phosphatase: 71 U/L (ref 38–126)
Anion gap: 6 (ref 5–15)
BUN: 24 mg/dL — ABNORMAL HIGH (ref 8–23)
CO2: 33 mmol/L — ABNORMAL HIGH (ref 22–32)
Calcium: 8.8 mg/dL — ABNORMAL LOW (ref 8.9–10.3)
Chloride: 102 mmol/L (ref 98–111)
Creatinine, Ser: 0.94 mg/dL (ref 0.44–1.00)
GFR, Estimated: >60 mL/min (ref >60)
Glucose, Bld: 97 mg/dL (ref 70–99)
Potassium: 3.1 mmol/L — ABNORMAL LOW (ref 3.5–5.1)
Sodium: 141 mmol/L (ref 135–145)
Total Bilirubin: 0.5 mg/dL (ref 0.3–1.2)
Total Protein: 5.9 g/dL — ABNORMAL LOW (ref 6.5–8.1)

## 2020-01-29 LAB — CBC
HCT: 28.2 % — ABNORMAL LOW (ref 36.0–46.0)
Hemoglobin: 8.8 g/dL — ABNORMAL LOW (ref 12.0–15.0)
MCH: 29.9 pg (ref 26.0–34.0)
MCHC: 31.2 g/dL (ref 30.0–36.0)
MCV: 95.9 fL (ref 80.0–100.0)
Platelets: 134 10*3/uL — ABNORMAL LOW (ref 150–400)
RBC: 2.94 MIL/uL — ABNORMAL LOW (ref 3.87–5.11)
RDW: 16.7 % — ABNORMAL HIGH (ref 11.5–15.5)
WBC: 3.4 10*3/uL — ABNORMAL LOW (ref 4.0–10.5)
nRBC: 0 % (ref 0.0–0.2)

## 2020-01-29 LAB — GLUCOSE, CAPILLARY
Glucose-Capillary: 86 mg/dL (ref 70–99)
Glucose-Capillary: 95 mg/dL (ref 70–99)
Glucose-Capillary: 93 mg/dL (ref 70–99)
Glucose-Capillary: 156 mg/dL — ABNORMAL HIGH (ref 70–99)

## 2020-01-29 LAB — RETICULOCYTES
Immature Retic Fract: 18.8 % — ABNORMAL HIGH (ref 2.3–15.9)
RBC.: 3.01 MIL/uL — ABNORMAL LOW (ref 3.87–5.11)
Retic Count, Absolute: 53.6 10*3/uL (ref 19.0–186.0)
Retic Ct Pct: 1.8 % (ref 0.4–3.1)

## 2020-01-29 LAB — IRON AND TIBC
Iron: 66 ug/dL (ref 28–170)
Saturation Ratios: 23 % (ref 10.4–31.8)
TIBC: 290 ug/dL (ref 250–450)
UIBC: 224 ug/dL

## 2020-01-29 LAB — FOLATE: Folate: 30 ng/mL (ref >5.9)

## 2020-01-29 LAB — HEMOGLOBIN AND HEMATOCRIT, BLOOD
HCT: 33.7 % — ABNORMAL LOW (ref 36.0–46.0)
Hemoglobin: 10.8 g/dL — ABNORMAL LOW (ref 12.0–15.0)

## 2020-01-29 LAB — HEMOGLOBIN A1C
Hgb A1c MFr Bld: 9.1 % — ABNORMAL HIGH (ref 4.8–5.6)
Mean Plasma Glucose: 214.47 mg/dL

## 2020-01-29 LAB — VITAMIN B12: Vitamin B-12: 922 pg/mL — ABNORMAL HIGH (ref 180–914)

## 2020-01-29 LAB — MAGNESIUM: Magnesium: 2.2 mg/dL (ref 1.7–2.4)

## 2020-01-29 LAB — FERRITIN: Ferritin: 82 ng/mL (ref 11–307)

## 2020-01-29 MED ORDER — BISACODYL 5 MG PO TBEC
10.0000 mg | DELAYED_RELEASE_TABLET | Freq: Once | ORAL | Status: AC
  Administered 2020-01-29: 10 mg via ORAL
  Filled 2020-01-29: qty 2

## 2020-01-29 MED ORDER — POLYETHYLENE GLYCOL 3350 17 GM/SCOOP PO POWD
1.0000 | Freq: Once | ORAL | Status: AC
  Administered 2020-01-29: 255 g via ORAL
  Filled 2020-01-29: qty 255

## 2020-01-29 MED ORDER — SODIUM CHLORIDE 0.9 % IV SOLN: INTRAVENOUS | Status: DC

## 2020-01-29 MED ORDER — POTASSIUM CHLORIDE CRYS ER 20 MEQ PO TBCR
40.0000 meq | EXTENDED_RELEASE_TABLET | ORAL | Status: AC
  Administered 2020-01-29 (×2): 40 meq via ORAL
  Filled 2020-01-29 (×2): qty 2

## 2020-01-29 MED ORDER — OLANZAPINE 2.5 MG PO TABS
2.5000 mg | ORAL_TABLET | Freq: Two times a day (BID) | ORAL | Status: DC
  Administered 2020-01-29: 2.5 mg via ORAL
  Filled 2020-01-29 (×3): qty 1

## 2020-01-29 NOTE — Consult Note (Signed)
GI Inpatient Consult Note  Reason for Consult: Acute GI Bleeding   Attending Requesting Consult: Dr. Jennye Boroughs  History of Present Illness: Kristi [name] is a 79 y.o. female seen for evaluation of acute GI bleeding at the request of Dr. Mal Misty. Pt has a PMH of HTN, HLD, diabetes, CAD s/p CABG, atrial fibrillation on Eliquis, Parkinson's disease, Hx of anal cancer s/p transanal excision and radiation 12/2017, and chronic pain syndrome. She reports she noticed bright red blood per rectum about 10 days ago where she noticed bright red blood on tissue paper every time she wiped and also seeing some dark maroon colored clots on tissue paper. This occurred every time she wiped. She reports she tried to get an OV appointment at [facility], but was told earliest available appt was March 2021. She was able to get a work-in appointment and saw Stephens November, NP where she had active bleeding and was transported to the ED for urgent evaluation. Upon presentation to the ED, she had hemoglobin 10.2, hematocrit 31.1% with normal iron levels. She reports she had about 2 more episodes today of rectal bleeding with dark red blood and bright red blood. She reports she did have onset of diarrhea two days ago which increased the volume of bleeding. Hemoglobin this morning was 8.8 with normal retic ct 1.8%. She had CTA abd/pelvis with and without contrast performed last night which showed no evidence of active GI bleeding, but stool within distal colon did limit evaluation. There was mention of possible wall thickening along the left posterolateral aspect of anal verge, poorly evaluated by CT. Per ED physician, this was not felt on digital exam. Patient seen bedside this afternoon and reports she is feeling better than she did yesterday. She knows IV fluids have helped her. She is hungry and is requesting to have a diet. She had two episodes of the rectal bleeding today, most recently seen 2 hours ago while having a BM.  She has been mildly nauseous without any evidence of vomiting. She denies any abdominal pain, cramping, tenesmus, fevers, chills, or rectal pain. She reports her last dose of Eliquis was Monday night. She denies any frequent NSAID use.     Last Colonoscopy: 11/29/2017 - Diverticulosis in the sigmoid colon. - One 15 mm polyp at the anus. Dr. 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  Last Endoscopy: 11/29/2017 - Normal esophagus. - Erythematous mucosa in the anterior wall of the gastric antrum. Mild chronic gastritis. - Normal examined duodenum.   Past Medical History:  Past Medical History:  Diagnosis Date  . Anxiety   . Arthritis   . Broken arm    left FA 3/17  . Broken arm    left  . CAD (coronary artery disease)    s/p MI  . Cancer ([place])    uterine  . Cervical disc disorder   . Depression   . Diabetes mellitus without complication ([place])   . GERD (gastroesophageal reflux disease)   . Headache   . Hyperlipidemia   . Hypertension   . Hypothyroid   . Myocardial infarction ([place])    15 year ago  . Neck pain 06/04/2014  . Parkinson's disease ([place])   . Presence of permanent cardiac pacemaker   . Spinal stenosis     Problem List: Patient Active Problem List   Diagnosis Date Noted  .  GI bleed 01/28/2020  . Rectal bleed 01/28/2020  . At risk for polypharmacy 01/28/2020  . Acute delirium   . HCAP (healthcare-associated pneumonia)   . Persistent atrial fibrillation ([place])   . Hypotension   . Hyponatremia   . Lobar pneumonia ([place]) 10/23/2019  . Atrial flutter ([place])   . Dizziness   . Atrial fibrillation status post cardioversion ([place]) 09/27/2019  . CHF (congestive heart failure) ([place]) 09/26/2019  . Acute on chronic diastolic heart failure ([place]) 09/25/2019  . Hypertensive urgency 09/25/2019  .  Fluid overload 09/25/2019  . Excessive gas 05/14/2019  . Generalized bloating 05/14/2019  . Bright red blood per rectum 05/14/2019  . 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 Chest pain 11/20/2018  . Difficulty walking 11/19/2018  . Insomnia 11/15/2018  . Type 2 diabetes mellitus with hyperlipidemia ([place]) 11/15/2018  . Goals of care, counseling/discussion 02/06/2018  . Cancer of anal canal ([place]) 01/25/2018  . Rectal mass 01/05/2018  . Leg pain, bilateral 12/06/2017  . Lymphedema 07/10/2017  . Atherosclerosis of native arteries of the extremities with ulceration ([place]) 07/10/2017  . Stricture and stenosis of esophagus   . Dysphagia   . Syncope and collapse 04/11/2017  . Mild dementia ([place]) 01/24/2017  . Lumbar facet osteoarthritis (Bilateral) 01/17/2017  . Acute postoperative pain 01/17/2017  . Hypokalemia 12/24/2016  . Anxiety 12/07/2016  . 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 Cerebrovascular accident ([place]) 12/07/2016  . Chronic depression 12/07/2016  . Essential hypertension 12/07/2016  . Hypothyroidism 12/07/2016  . Mixed hyperlipidemia 12/07/2016  . Myocardial infarction ([place]) 12/07/2016  . TIA (transient ischemic attack) 11/17/2016  . Parkinson disease ([place]) 10/27/2016  . Tremor 10/27/2016  . Chronic knee pain (B) (R>L) 10/19/2016  . Ischemic chest pain ([place]) 09/29/2016  . Abnormal CT scan, lumbar spine 09/08/2016  . Lumbar foraminal stenosis (multilevel) 09/08/2016  . Fracture of clavicle 08/03/2016  . Polyneuropathy 05/17/2016  . Neurogenic pain 04/12/2016  . Chronic lower extremity pain (Bilateral) (R>L) 04/12/2016  . Chronic lower extremity radicular pain (Primary Source of Pain) (Bilateral) (R>L) 04/12/2016  . Lower extremity weakness 04/12/2016  . Chronic low back pain (Secondary source of pain) (Bilateral) (R>L) 04/12/2016  . Chronic wrist pain (Left) (secondary to fracture) 04/12/2016  . Lumbar spondylosis  04/12/2016  . Chronic pain syndrome 04/11/2016  . Long term current use of opiate analgesic 04/11/2016  . Long term prescription opiate use 04/11/2016  . Opiate use 04/11/2016  . Long term prescription benzodiazepine use 04/11/2016  . Lumbar radiculopathy (Multilevel) (Bilateral) 05/07/2015  . Lumbar facet syndrome (Bilateral) (R>L) 08/21/2014  . 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 Sacroiliac joint dysfunction (Bilateral) 08/13/2014  . Frequent falls 06/04/2014  . Memory loss 06/04/2014  . Anxiety and depression 08/22/2013  . CAD (coronary artery disease) 08/22/2013  . Therapeutic opioid-induced constipation (OIC) 08/22/2013  . GERD (gastroesophageal reflux disease) 08/22/2013    Past Surgical History: Past Surgical History:  Procedure Laterality Date  . ABDOMINAL HYSTERECTOMY     partial  . BREAST SURGERY    . CARDIOVERSION N/A 10/24/2019   Procedure: CARDIOVERSION;  Surgeon: Wellington Hampshire, MD;  Location: [facility] ORS;  Service: Cardiovascular;  Laterality: N/A;  . COLONOSCOPY    . COLONOSCOPY WITH PROPOFOL N/A 11/29/2017   Procedure: COLONOSCOPY WITH PROPOFOL;  Surgeon: [name], MD;  Location: [facility] ENDOSCOPY;  Service: Endoscopy;  Laterality: N/A;  . CORONARY ANGIOPLASTY     stent  .  CORONARY ARTERY BYPASS GRAFT    . CORONARY STENT PLACEMENT    . ESOPHAGOGASTRODUODENOSCOPY (EGD) WITH PROPOFOL N/A 04/18/2017   Procedure: ESOPHAGOGASTRODUODENOSCOPY (EGD) WITH PROPOFOL;  Surgeon: [name], MD;  Location: [facility] ENDOSCOPY;  Service: Endoscopy;  Laterality: N/A;  . ESOPHAGOGASTRODUODENOSCOPY (EGD) WITH PROPOFOL N/A 11/29/2017   Procedure: ESOPHAGOGASTRODUODENOSCOPY (EGD) WITH PROPOFOL;  Surgeon: [name], MD;  Location[facility] ENDOSCOPY;  Service: Endoscopy;  Laterality: N/A;  . INSERT / REPLACE / REMOVE PACEMAKER    . PPM GENERATOR CHANGEOUT N/A 09/05/2019   Procedure: PPM GENERATOR CHANGEOUT;  Surgeon: [name], MD;   Location: [facility] LAB;  Service: Cardiovascular;  Laterality: N/A;  . s/p pacer insertion    . TRANSANAL EXCISION OF RECTAL MASS N/A 01/17/2018   Procedure: TRANSANAL EXCISION OF RECTAL POLYP;  Surgeon: [name], MD;  Location: [facility] ORS;  Service: General;  Laterality: N/A;    Allergies: Allergies  Allergen Reactions  . Penicillins Anaphylaxis, Swelling and Rash    TOLERATES CEFTRIAXONE    Home Medications: Medications Prior to Admission  Medication Sig Dispense Refill Last Dose  . amiodarone (PACERONE) 200 MG tablet Take 1 tablet (200 mg total) by mouth daily. 90 tablet 2 Unknown at Unknown  . apixaban (ELIQUIS) 5 MG TABS tablet Take 1 tablet (5 mg total) by mouth 2 (two) times daily. 60 tablet 0 Unknown at Unknown  . cholecalciferol (VITAMIN D3) 25 MCG (1000 UNIT) tablet Take 1,000 Units by mouth daily.   Unknown at Unknown  . cholestyramine (QUESTRAN) 4 g packet Take 1 packet (4 g total) by mouth daily. in water   Unknown at Unknown  . donepezil (ARICEPT) 10 MG tablet Take 10 mg by mouth daily.    Unknown at Unknown  . gabapentin (NEURONTIN) 300 MG capsule Take 1 capsule (300 mg total) by mouth 2 (two) times daily. Take 300 mg in the morning and 600 mg at night (Patient taking differently: Take 300-600 mg by mouth 3 (three) times daily. Take 300 mg in the morning, 300 mg in the afternoon and 600 mg at night)   Unknown at Unknown  . levothyroxine (SYNTHROID) 125 MCG tablet Take 125 mcg by mouth daily.    Unknown at Unknown  . albuterol (VENTOLIN HFA) 108 (90 Base) MCG/ACT inhaler Inhale 2 puffs into the lungs every 6 (six) hours as needed for wheezing or shortness of breath. 8 g 0   . Ferrous Sulfate (SLOW FE) 142 (45 Fe) MG TBCR Take 1 tablet by mouth twice daily     . hydrocortisone (ANUSOL-HC) 25 MG suppository Place 1 suppository (25 mg total) rectally 2 (two) times daily. Okay to substitute generic 12 suppository 0   . hydrocortisone 2.5 % cream      . insulin detemir  (LEVEMIR) 100 UNIT/ML FlexPen Inject 5 Units into the skin every evening. 15 mL 0   . Insulin Pen Needle 31G X 8 MM MISC 1 Dose by Does not apply route at bedtime. 100 each 0   . LINZESS 72 MCG capsule Take 72 mcg by mouth every morning.     . liver oil-zinc oxide (DESITIN) 40 % ointment Apply topically 2 (two) times daily. Apply to buttock and groin ares 56.7 g 0   . loratadine (CLARITIN) 10 MG tablet Take 10 mg by mouth daily.      . meclizine (ANTIVERT) 12.5 MG tablet Take 1 tablet (12.5 mg total) by mouth 2 (two) times daily as needed for dizziness. [place]  tablet 0   . metoprolol succinate (TOPROL-XL) 25 MG 24 hr tablet Take 0.5 tablets (12.5 mg total) by mouth at bedtime. 30 tablet 0   . montelukast (SINGULAIR) 10 MG tablet Take 10 mg by mouth at bedtime.      . Multiple Vitamin (MULTIVITAMIN WITH MINERALS) TABS tablet Take 1 tablet by mouth daily.     . nitroGLYCERIN (NITROSTAT) 0.4 MG SL tablet Place 0.4 mg under the tongue every 5 (five) minutes as needed for chest pain.      [name] OLANZapine (ZYPREXA) 2.5 MG tablet Take 2.5 mg by mouth in the morning and at bedtime.      [name] omeprazole (PRILOSEC) 40 MG capsule Take 40 mg by mouth 2 (two) times daily.     . pioglitazone (ACTOS) 30 MG tablet Take 30 mg by mouth daily.     . potassium chloride SA (KLOR-CON) 20 MEQ tablet Take 2 tablets (40 mEq total) by mouth daily. 60 tablet 0   . rosuvastatin (CRESTOR) 40 MG tablet Take 40 mg by mouth daily.     . sertraline (ZOLOFT) 100 MG tablet Take 100 mg by mouth 2 (two) times daily.     . silver sulfADIAZINE (SILVADENE) 1 % cream Apply 1 application topically 2 (two) times daily. Apply to left heel wound  (please help her with dressing supplies to purchase.  I was thinking a 4x4 on the wound and then she could use a piece of a Kerlex to hold it in place so one Kerlex would last for multiple dressings) 50 g 0   . torsemide (DEMADEX) 20 MG tablet Take 2 tablets (40 mg) once daily in the AM & take 2 tablets (40  mg) on Mondays, Wednesdays, & Fridays at lunch time     . traMADol (ULTRAM) 50 MG tablet Take 1 tablet (50 mg total) by mouth every 8 (eight) hours as needed for moderate pain. 15 tablet 0   . traZODone (DESYREL) 100 MG tablet Take 1 tablet (100 mg total) by mouth at bedtime. 30 tablet 0   . vitamin B-12 (CYANOCOBALAMIN) 1000 MCG tablet Take 1,000 mcg by mouth daily.     . vitamin E 180 MG (400 UNITS) capsule Take 400 Units by mouth daily.      Home medication reconciliation was completed with the patient.   Scheduled Inpatient Medications:   . cholestyramine  4 g Oral Daily  . donepezil  10 mg Oral Daily  . gabapentin  300 mg Oral q AM  . gabapentin  600 mg Oral QHS  . insulin aspart  0-15 Units Subcutaneous TID WC  . insulin aspart  0-5 Units Subcutaneous QHS  . levothyroxine  125 mcg Oral Daily  . metoprolol succinate  12.5 mg Oral QHS  . OLANZapine  2.5 mg Oral BID  . potassium chloride  40 mEq Oral Q4H  . rosuvastatin  40 mg Oral Daily  . sertraline  100 mg Oral BID  . traZODone  100 mg Oral QHS    Continuous Inpatient Infusions:   . sodium chloride      PRN Inpatient Medications:  acetaminophen **OR** acetaminophen, nitroGLYCERIN, ondansetron **OR** ondansetron (ZOFRAN) IV, pneumococcal 23 valent vaccine, traMADol  Family History: family history includes Aneurysm in her mother; Cancer in her mother; Depression in her mother; Diabetes in her brother, brother, mother, and sister; Heart disease in her father; Hypertension in her mother.  The patient's family history is negative for inflammatory bowel disorders, GI malignancy, or solid organ  transplantation.  Social History:   reports that she has never smoked. She has never used smokeless tobacco. She reports that she does not drink alcohol and does not use drugs. The patient denies ETOH, tobacco, or drug use.   Review of Systems: Constitutional: Weight is stable.  Eyes: No changes in vision. ENT: No oral lesions, sore  throat.  GI: see HPI.  Heme/Lymph: No easy bruising.  CV: No chest pain.  GU: No hematuria.  Integumentary: No rashes.  Neuro: No headaches.  Psych: No depression/anxiety.  Endocrine: No heat/cold intolerance.  Allergic/Immunologic: No urticaria.  Resp: No cough, SOB.  Musculoskeletal: No joint swelling.    Physical Examination: BP (!) 107/51 (BP Location: Right Arm)   Pulse 60   Temp 97.6 F (36.4 C) (Oral)   Resp 16   Ht 5\' 6"  (1.676 m)   Wt 86 kg   SpO2 94%   BMI 30.60 kg/m  Gen: NAD, alert and oriented x 4 HEENT: PEERLA, EOMI, Neck: supple, no JVD or thyromegaly Chest: CTA bilaterally, no wheezes, crackles, or other adventitious sounds CV: RRR, no m/g/c/r Abd: soft, NT, ND, +BS in all four quadrants; no HSM, guarding, ridigity, or rebound tenderness Ext: no edema, well perfused with 2+ pulses, Skin: no rash or lesions noted Lymph: no LAD  Data: Lab Results  Component Value Date   WBC 3.4 (L) 01/29/2020   HGB 8.8 (L) 01/29/2020   HCT 28.2 (L) 01/29/2020   MCV 95.9 01/29/2020   PLT 134 (L) 01/29/2020   Recent Labs  Lab 01/28/20 1255 01/28/20 1715 01/29/20 0441  HGB 10.2* 10.1* 8.8*   Lab Results  Component Value Date   NA 141 01/29/2020   K 3.1 (L) 01/29/2020   CL 102 01/29/2020   CO2 33 (H) 01/29/2020   BUN 24 (H) 01/29/2020   CREATININE 0.94 01/29/2020   Lab Results  Component Value Date   ALT 18 01/29/2020   AST 23 01/29/2020   ALKPHOS 71 01/29/2020   BILITOT 0.5 01/29/2020   No results for input(s): APTT, INR, PTT in the last 168 hours.   Cancer of anal canal (CMS-HCC)  Dx'd on colonoscopy on 11/29/17. 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 S/p radiation with Dr. Baruch Gouty.    CTA ABD/PELVIS W/WO CONTRAST 01/28/2020: EXAM: CTA ABDOMEN AND PELVIS WITHOUT AND WITH CONTRAST  TECHNIQUE: Multidetector CT imaging of the abdomen  and pelvis was performed using the standard protocol during bolus administration of intravenous contrast. Multiplanar reconstructed images and MIPs were obtained and reviewed to evaluate the vascular anatomy.  CONTRAST:  54mL OMNIPAQUE IOHEXOL 350 MG/ML SOLN  COMPARISON:  10/23/2019  FINDINGS: VASCULAR  Aorta: Normal caliber aorta without aneurysm, dissection, vasculitis or significant stenosis. Mild diffuse atherosclerosis.  Celiac: Patent without evidence of aneurysm, dissection, vasculitis or significant stenosis.  SMA: Patent without evidence of aneurysm, dissection, vasculitis or significant stenosis.  Renals: Both renal arteries are patent without evidence of aneurysm, dissection, vasculitis, fibromuscular dysplasia or significant stenosis. Moderate atherosclerosis at the vessel origins.  IMA: Patent without evidence of aneurysm, dissection, vasculitis or significant stenosis.  Inflow: Moderate atherosclerosis without focal stenosis, aneurysm, or dissection.  Proximal Outflow: Bilateral common femoral and visualized portions of the superficial and profunda femoral arteries are patent without evidence of aneurysm, dissection, vasculitis or significant stenosis.  Veins: No obvious venous abnormality within the limitations of this arterial phase study.  Review of the MIP  images confirms the above findings.  NON-VASCULAR  Lower chest: No acute pleural or parenchymal lung disease. Small hiatal hernia.  Hepatobiliary: No focal liver abnormality is seen. No gallstones, gallbladder wall thickening, or biliary dilatation.  Pancreas: Unremarkable. No pancreatic ductal dilatation or surrounding inflammatory changes.  Spleen: Normal in size without focal abnormality.  Adrenals/Urinary Tract: Mild bilateral renal cortical atrophy. Otherwise the kidneys enhance normally. No urinary tract calculi or obstructive uropathy. The adrenals and bladder are  unremarkable.  Stomach/Bowel: No bowel obstruction or ileus. Normal appendix right lower quadrant. Diverticulosis of the distal colon without diverticulitis.  High attenuation stool is seen within the distal colon, which limits evaluation for active gastrointestinal bleeding. There is no evidence of intraluminal contrast accumulation to suggest active gastrointestinal bleeding.  There may be wall thickening along the left posterolateral aspect of the anal verge. Please correlate with physical exam.  Lymphatic: No pathologic adenopathy.  Reproductive: Status post hysterectomy. No adnexal masses.  Other: No free fluid or free gas.  No abdominal wall hernia.  Musculoskeletal: No acute or destructive bony lesions. Multilevel lumbar spondylosis. Reconstructed images demonstrate no additional findings.  IMPRESSION: VASCULAR  1. No evidence of active gastrointestinal bleeding. High attenuation stool within the distal colon does somewhat limit the evaluation. 2. Aortic Atherosclerosis (ICD10-I70.0). No evidence of significant stenosis, aneurysm, or dissection.  NON-VASCULAR  1. Possible wall thickening along the left posterolateral aspect of the anal verge, poorly evaluated by CT. Correlation with physical exam recommended. 2. Diverticulosis without diverticulitis. 3. Small hiatal hernia.  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 Rectal bleeding/Lower GI Bleeding - DDx includes anal outlet etiology, recurrence of anal cancer, colorectal polyp, AVMs, diverticular, solitary rectal ulcer syndrome, IBD, mucosal irritation, etc  2. Acute blood loss anemia - hemodynamically stable with no requirement for blood  transfusion. Hemoglobin 8.8. She has continued to have bleeding.   3. Hx of anal cancer - found on colonoscopy 11/2017 and s/p transanal excision and radiation 12/2017  4. Paroxsymal atrial fibrillation - Eliquis on hold. Lase dose Monday night.  5. IDDM  COVID-19 Test: NEGATIVE  Recommendations:  - Continue to monitor H&H closely. Transfuse for Hgb <7.0. - Continue IV fluid hydration and IV protonix - Advise endoluminal evaluation with colonoscopy for direct visualization. Discussed procedure details and indications with patient and she consents to proceed. - Clear liquid diet today. Bowel prep starting 1600 today. NPO after midnight - Plan for colonoscopy tomorrow morning with Dr. Alice Reichert - See procedure note for findings and further recommendations  I reviewed the risks (including bleeding, perforation, infection, anesthesia complications, cardiac/respiratory complications), benefits and alternatives of colonoscopy. Patient consents to proceed.    Thank you for the consult. Please call with questions or concerns.  Re[facility] For[facility] [phone] (Cell)

## 2020-01-29 NOTE — Progress Notes (Addendum)
Progress Note    Kristi [name]  STM:[id number] DOB: 04-Apr-1940  DOA: 01/28/2020 PCP: Martin Majestic, FNP      Brief Narrative:    Medical records reviewed and are as summarized below:  Kristi [name] is a 79 y.o. [name]       Assessment/Plan:   Principal Problem:   Rectal bleed Active Problems:   Anxiety and depression   CAD (coronary artery disease)   Chronic knee pain (B) (R>L)   Anxiety   Cardiac pacemaker in situ   Hypothyroidism   Mixed hyperlipidemia   Mild dementia ([place])   Atrial fibrillation status post cardioversion [facility])   GI bleed   At risk for polypharmacy    Body mass index is 30.6 kg/m.  (Obesity)   GI bleeding/rectal bleeding: Gastroenterologist has been consulted for further evaluation.  Keep n.p.o. for now.  Treat with IV fluids and IV Protonix.  Acute blood loss anemia: No indication for blood transfusion at this time.  Monitor H&H.  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  Use Humalog as needed for hyperglycemia.  Monitor glucose levels closely.  Continue gabapentin.  Hypokalemia: Replete potassium and monitor levels  CAD with history of CABG and coronary stent: Eliquis on hold  Paroxysmal atrial fibrillation: Eliquis on hold.  She no longer takes amiodarone.  Chronic diastolic CHF: Compensated.  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  She says she receives injection in her joints periodically.  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 cholestyramine  4 g Oral Daily  . donepezil  10 mg Oral Daily  . gabapentin  300 mg Oral q AM  . gabapentin  600 mg Oral QHS  . insulin aspart  0-15 Units Subcutaneous TID WC  . insulin aspart  0-5 Units Subcutaneous QHS  . levothyroxine  125 mcg Oral Daily  . metoprolol succinate  12.5 mg Oral QHS  . OLANZapine  2.5 mg Oral BID  . potassium chloride  40 mEq Oral Q4H  . rosuvastatin  40 mg Oral Daily  . sertraline  100 mg Oral BID  . traZODone  100 mg Oral QHS   Continuous Infusions: . sodium chloride       Anti-infectives (From admission, onward)   None             Family Communication/Anticipated D/C date and plan/Code Status   DVT prophylaxis: Place TED hose Start: 01/28/20 2132     Code Status: Full Code  Family Communication: None Disposition Plan:    Status is: Inpatient  Remains inpatient appropriate because:Ongoing diagnostic testing needed not appropriate for outpatient work up and IV treatments appropriate due to intensity of illness or inability to take PO   Dispo: The patient is from: Home              Anticipated d/c is to: Home  Anticipated d/c date is: 2 days              Patient currently is not medically stable to d/c.           Subjective:   C/o 1 episode of bloody stools this morning.  No abdominal pain or hematemesis.  Objective:    Vitals:   01/28/20 2353 01/29/20 0356 01/29/20 0822 01/29/20 1139  BP: (!) 122/48 (!) 144/77 (!) 88/52 (!) 107/51  Pulse: 66 73 65 60  Resp: 16 20 16 16   Temp: (!) 97.5 F (36.4 C) 97.8 F (36.6 C) 98.1 F (36.7 C) 97.6 F (36.4 C)  TempSrc: Oral Oral Oral Oral  SpO2: 100% 95% 93% 94%  Weight:      Height:       No data found.   Intake/Output Summary (Last 24 hours) at 01/29/2020 1553 Last data filed at 01/29/2020 1100 Gross per 24 hour  Intake 30 ml  Output 500 ml  Net  -470 ml   Filed Weights   01/28/20 1255 01/28/20 2300  Weight: 86.2 kg 86 kg    Exam:  GEN: NAD SKIN: No rash EYES: EOMI ENT: MMM CV: RRR PULM: CTA B ABD: soft, ND, NT, +BS CNS: AAO x 3, non focal EXT: No edema or tenderness   Data Reviewed:   I have personally reviewed following labs and imaging studies:  Labs: Labs show the following:   Basic Metabolic Panel: Recent Labs  Lab 01/28/20 1255 01/29/20 0441  NA 139 141  K 3.5 3.1*  CL 97* 102  CO2 30 33*  GLUCOSE 160* 97  BUN 28* 24*  CREATININE 1.22* 0.94  CALCIUM 9.1 8.8*  MG  --  2.2   GFR Estimated Creatinine Clearance: 51.9 mL/min (by C-G formula based on SCr of 0.94 mg/dL). Liver Function Tests: Recent Labs  Lab 01/28/20 1255 01/29/20 0441  AST 28 23  ALT 21 18  ALKPHOS 87 71  BILITOT 0.6 0.5  PROT 7.0 5.9*  ALBUMIN 3.7 3.2*   No results for input(s): LIPASE, AMYLASE in the last 168 hours. No results for input(s): AMMONIA in the last 168 hours. Coagulation profile No results for input(s): INR, PROTIME in the last 168 hours.  CBC: Recent Labs  Lab 01/28/20 1255 01/28/20 1715 01/29/20 0441  WBC 4.1  --  3.4*  HGB 10.2* 10.1* 8.8*  HCT 31.1* 32.0* 28.2*  MCV 94.5  --  95.9  PLT 150  --  134*   Cardiac Enzymes: No results for input(s): CKTOTAL, CKMB, CKMBINDEX, TROPONINI in the last 168 hours. BNP (last 3 results) No results for input(s): PROBNP in the last 8760 hours. CBG: Recent Labs  Lab 01/28/20 2226 01/29/20 0744 01/29/20 1140  GLUCAP 252* 86 95   D-Dimer: No results for input(s): DDIMER in the last 72 hours. Hgb A1c: Recent Labs    01/29/20 0441  HGBA1C 9.1*   Lipid Profile: No results for input(s): CHOL, HDL, LDLCALC, TRIG, CHOLHDL, LDLDIRECT in the last 72 hours. Thyroid function studies: No results for input(s): TSH, T4TOTAL, T3FREE, THYROIDAB in the last 72 hours.  Invalid input(s): FREET3 Anemia work up: Recent Labs    01/29/20 0441  VITAMINB12 922*   FOLATE 30.0  FERRITIN 82  TIBC 290  IRON 66  RETICCTPCT 1.8   Sepsis Labs: Recent Labs  Lab 01/28/20 1255 01/29/20 0441  WBC 4.1 3.4*    Microbiology Recent Results (from the past 240 hour(s))  Respiratory Panel by RT PCR (  Flu A&B, Covid) - Nasopharyngeal Swab     Status: None   Collection Time: 01/28/20  7:30 PM   Specimen: Nasopharyngeal Swab  Result Value Ref Range Status   SARS Coronavirus 2 by RT PCR NEGATIVE NEGATIVE Final    Comment: (NOTE) SARS-CoV-2 target nucleic acids are NOT DETECTED.  The SARS-CoV-2 RNA is generally detectable in upper respiratoy specimens during the acute phase of infection. The lowest concentration of SARS-CoV-2 viral copies this assay can detect is 131 copies/mL. A negative result does not preclude SARS-Cov-2 infection and should not be used as the sole basis for treatment or other patient management decisions. A negative result may occur with  improper specimen collection/handling, submission of specimen other than nasopharyngeal swab, presence of viral mutation(s) within the areas targeted by this assay, and inadequate number of viral copies (<131 copies/mL). A negative result must be combined with clinical observations, patient history, and epidemiological information. The expected result is Negative.  Fact Sheet for Patients:  PinkCheek.be  Fact Sheet for Healthcare Providers:  GravelBags.it  This test is no t yet approved or cleared by the [place] FDA and  has been authorized for detection and/or diagnosis of SARS-CoV-2 by FDA under an Emergency Use Authorization (EUA). This EUA will remain  in effect (meaning this test can be used) for the duration of the COVID-19 declaration under Section 564(b)(1) of the Act, 21 U.S.C. section 360bbb-3(b)(1), unless the authorization is terminated or revoked sooner.     Influenza A by PCR NEGATIVE NEGATIVE Final   Influenza B  by PCR NEGATIVE NEGATIVE Final    Comment: (NOTE) The Xpert Xpress SARS-CoV-2/FLU/RSV assay is intended as an aid in  the diagnosis of influenza from Nasopharyngeal swab specimens and  should not be used as a sole basis for treatment. Nasal washings and  aspirates are unacceptable for Xpert Xpress SARS-CoV-2/FLU/RSV  testing.  Fact Sheet for Patients: PinkCheek.be  Fact Sheet for Healthcare Providers: GravelBags.it  This test is not yet approved or cleared by the [place] FDA and  has been authorized for detection and/or diagnosis of SARS-CoV-2 by  FDA under an Emergency Use Authorization (EUA). This EUA will remain  in effect (meaning this test can be used) for the duration of the  Covid-19 declaration under Section 564(b)(1) of the Act, 21  U.S.C. section 360bbb-3(b)(1), unless the authorization is  terminated or revoked. Performed at [facility], [address]  [place], [place] [address]     Procedures and diagnostic studies:  CT Angio Abd/Pel w/ and/or w/o  Result Date: 01/28/2020 CLINICAL DATA:  Gastrointestinal bleeding, abdominal pain, rectal bleeding for 2 weeks EXAM: CTA ABDOMEN AND PELVIS WITHOUT AND WITH CONTRAST TECHNIQUE: Multidetector CT imaging of the abdomen and pelvis was performed using the standard protocol during bolus administration of intravenous contrast. Multiplanar reconstructed images and MIPs were obtained and reviewed to evaluate the vascular anatomy. CONTRAST:  59mL OMNIPAQUE IOHEXOL 350 MG/ML SOLN COMPARISON:  10/23/2019 FINDINGS: VASCULAR Aorta: Normal caliber aorta without aneurysm, dissection, vasculitis or significant stenosis. Mild diffuse atherosclerosis. Celiac: Patent without evidence of aneurysm, dissection, vasculitis or significant stenosis. SMA: Patent without evidence of aneurysm, dissection, vasculitis or significant stenosis. Renals: Both renal arteries are patent  without evidence of aneurysm, dissection, vasculitis, fibromuscular dysplasia or significant stenosis. Moderate atherosclerosis at the vessel origins. IMA: Patent without evidence of aneurysm, dissection, vasculitis or significant stenosis. Inflow: Moderate atherosclerosis without focal stenosis, aneurysm, or dissection. Proximal Outflow: Bilateral common femoral and visualized portions of the superficial  and profunda femoral arteries are patent without evidence of aneurysm, dissection, vasculitis or significant stenosis. Veins: No obvious venous abnormality within the limitations of this arterial phase study. Review of the MIP images confirms the above findings. NON-VASCULAR Lower chest: No acute pleural or parenchymal lung disease. Small hiatal hernia. Hepatobiliary: No focal liver abnormality is seen. No gallstones, gallbladder wall thickening, or biliary dilatation. Pancreas: Unremarkable. No pancreatic ductal dilatation or surrounding inflammatory changes. Spleen: Normal in size without focal abnormality. Adrenals/Urinary Tract: Mild bilateral renal cortical atrophy. Otherwise the kidneys enhance normally. No urinary tract calculi or obstructive uropathy. The adrenals and bladder are unremarkable. Stomach/Bowel: No bowel obstruction or ileus. Normal appendix right lower quadrant. Diverticulosis of the distal colon without diverticulitis. High attenuation stool is seen within the distal colon, which limits evaluation for active gastrointestinal bleeding. There is no evidence of intraluminal contrast accumulation to suggest active gastrointestinal bleeding. There may be wall thickening along the left posterolateral aspect of the anal verge. Please correlate with physical exam. Lymphatic: No pathologic adenopathy. Reproductive: Status post hysterectomy. No adnexal masses. Other: No free fluid or free gas.  No abdominal wall hernia. Musculoskeletal: No acute or destructive bony lesions. Multilevel lumbar  spondylosis. Reconstructed images demonstrate no additional findings. IMPRESSION: VASCULAR 1. No evidence of active gastrointestinal bleeding. High attenuation stool within the distal colon does somewhat limit the evaluation. 2. Aortic Atherosclerosis (ICD10-I70.0). No evidence of significant stenosis, aneurysm, or dissection. NON-VASCULAR 1. Possible wall thickening along the left posterolateral aspect of the anal verge, poorly evaluated by CT. Correlation with physical exam recommended. 2. Diverticulosis without diverticulitis. 3. Small hiatal hernia. Electronically Signed   By: Randa Ngo M.D.   On: 01/28/2020 18:14               LOS: 0 days   Chelan Heringer  [facility]   Pager on [email or web address]. If 7PM-7AM, please contact night-coverage at [email or web address]     01/29/2020, 3:53 PM

## 2020-01-29 NOTE — H&P (View-Only) (Signed)
GI Inpatient Consult Note  Reason for Consult: Acute GI Bleeding   Attending Requesting Consult: Dr. Jennye Boroughs  History of Present Illness: Kristi [name] is a 78 y.o. female seen for evaluation of acute GI bleeding at the request of Dr. Mal Misty. Pt has a PMH of HTN, HLD, diabetes, CAD s/p CABG, atrial fibrillation on Eliquis, Parkinson's disease, Hx of anal cancer s/p transanal excision and radiation 12/2017, and chronic pain syndrome. She reports she noticed bright red blood per rectum about 10 days ago where she noticed bright red blood on tissue paper every time she wiped and also seeing some dark maroon colored clots on tissue paper. This occurred every time she wiped. She reports she tried to get an OV appointment at [place], but was told earliest available appt was March 2021. She was able to get a work-in appointment and saw Stephens November, NP where she had active bleeding and was transported to the ED for urgent evaluation. Upon presentation to the ED, she had hemoglobin 10.2, hematocrit 31.1% with normal iron levels. She reports she had about 2 more episodes today of rectal bleeding with dark red blood and bright red blood. She reports she did have onset of diarrhea two days ago which increased the volume of bleeding. Hemoglobin this morning was 8.8 with normal retic ct 1.8%. She had CTA abd/pelvis with and without contrast performed last night which showed no evidence of active GI bleeding, but stool within distal colon did limit evaluation. There was mention of possible wall thickening along the left posterolateral aspect of anal verge, poorly evaluated by CT. Per ED physician, this was not felt on digital exam. Patient seen bedside this afternoon and reports she is feeling better than she did yesterday. She knows IV fluids have helped her. She is hungry and is requesting to have a diet. She had two episodes of the rectal bleeding today, most recently seen 2 hours ago while having a BM.  She has been mildly nauseous without any evidence of vomiting. She denies any abdominal pain, cramping, tenesmus, fevers, chills, or rectal pain. She reports her last dose of Eliquis was Monday night. She denies any frequent NSAID use.     Last Colonoscopy: 11/29/2017 - Diverticulosis in the sigmoid colon. - One 15 mm polyp at the anus. Dr. 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  Last Endoscopy: 11/29/2017 - Normal esophagus. - Erythematous mucosa in the anterior wall of the gastric antrum. Mild chronic gastritis. - Normal examined duodenum.   Past Medical History:  Past Medical History:  Diagnosis Date  . Anxiety   . Arthritis   . Broken arm    left FA 3/17  . Broken arm    left  . CAD (coronary artery disease)    s/p MI  . Cancer ([place])    uterine  . Cervical disc disorder   . Depression   . Diabetes mellitus without complication ([place])   . GERD (gastroesophageal reflux disease)   . Headache   . Hyperlipidemia   . Hypertension   . Hypothyroid   . Myocardial infarction ([place])    15 year ago  . Neck pain 06/04/2014  . Parkinson's disease ([place])   . Presence of permanent cardiac pacemaker   . Spinal stenosis     Problem List: Patient Active Problem List   Diagnosis Date Noted  .  GI bleed 01/28/2020  . Rectal bleed 01/28/2020  . At risk for polypharmacy 01/28/2020  . Acute delirium   . HCAP (healthcare-associated pneumonia)   . Persistent atrial fibrillation ([place])   . Hypotension   . Hyponatremia   . Lobar pneumonia ([place]) 10/23/2019  . Atrial flutter ([place])   . Dizziness   . Atrial fibrillation status post cardioversion ([place]) 09/27/2019  . CHF (congestive heart failure) ([place]) 09/26/2019  . Acute on chronic diastolic heart failure ([place]) 09/25/2019  . Hypertensive urgency 09/25/2019  .  Fluid overload 09/25/2019  . Excessive gas 05/14/2019  . Generalized bloating 05/14/2019  . Bright red blood per rectum 05/14/2019  . 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 Chest pain 11/20/2018  . Difficulty walking 11/19/2018  . Insomnia 11/15/2018  . Type 2 diabetes mellitus with hyperlipidemia ([place]) 11/15/2018  . Goals of care, counseling/discussion 02/06/2018  . Cancer of anal canal ([place]) 01/25/2018  . Rectal mass 01/05/2018  . Leg pain, bilateral 12/06/2017  . Lymphedema 07/10/2017  . Atherosclerosis of native arteries of the extremities with ulceration ([place]) 07/10/2017  . Stricture and stenosis of esophagus   . Dysphagia   . Syncope and collapse 04/11/2017  . Mild dementia ([place]) 01/24/2017  . Lumbar facet osteoarthritis (Bilateral) 01/17/2017  . Acute postoperative pain 01/17/2017  . Hypokalemia 12/24/2016  . Anxiety 12/07/2016  . 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 Cerebrovascular accident ([place]) 12/07/2016  . Chronic depression 12/07/2016  . Essential hypertension 12/07/2016  . Hypothyroidism 12/07/2016  . Mixed hyperlipidemia 12/07/2016  . Myocardial infarction ([place]) 12/07/2016  . TIA (transient ischemic attack) 11/17/2016  . Parkinson disease ([place]) 10/27/2016  . Tremor 10/27/2016  . Chronic knee pain (B) (R>L) 10/19/2016  . Ischemic chest pain ([place]) 09/29/2016  . Abnormal CT scan, lumbar spine 09/08/2016  . Lumbar foraminal stenosis (multilevel) 09/08/2016  . Fracture of clavicle 08/03/2016  . Polyneuropathy 05/17/2016  . Neurogenic pain 04/12/2016  . Chronic lower extremity pain (Bilateral) (R>L) 04/12/2016  . Chronic lower extremity radicular pain (Primary Source of Pain) (Bilateral) (R>L) 04/12/2016  . Lower extremity weakness 04/12/2016  . Chronic low back pain (Secondary source of pain) (Bilateral) (R>L) 04/12/2016  . Chronic wrist pain (Left) (secondary to fracture) 04/12/2016  . Lumbar spondylosis  04/12/2016  . Chronic pain syndrome 04/11/2016  . Long term current use of opiate analgesic 04/11/2016  . Long term prescription opiate use 04/11/2016  . Opiate use 04/11/2016  . Long term prescription benzodiazepine use 04/11/2016  . Lumbar radiculopathy (Multilevel) (Bilateral) 05/07/2015  . Lumbar facet syndrome (Bilateral) (R>L) 08/21/2014  . 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 Sacroiliac joint dysfunction (Bilateral) 08/13/2014  . Frequent falls 06/04/2014  . Memory loss 06/04/2014  . Anxiety and depression 08/22/2013  . CAD (coronary artery disease) 08/22/2013  . Therapeutic opioid-induced constipation (OIC) 08/22/2013  . GERD (gastroesophageal reflux disease) 08/22/2013    Past Surgical History: Past Surgical History:  Procedure Laterality Date  . ABDOMINAL HYSTERECTOMY     partial  . BREAST SURGERY    . CARDIOVERSION N/A 10/24/2019   Procedure: CARDIOVERSION;  Surgeon: Wellington Hampshire, MD;  Location: [facility] ORS;  Service: Cardiovascular;  Laterality: N/A;  . COLONOSCOPY    . COLONOSCOPY WITH PROPOFOL N/A 11/29/2017   Procedure: COLONOSCOPY WITH PROPOFOL;  Surgeon: [name], MD;  Location: [facility] ENDOSCOPY;  Service: Endoscopy;  Laterality: N/A;  . CORONARY ANGIOPLASTY     stent  .  CORONARY ARTERY BYPASS GRAFT    . CORONARY STENT PLACEMENT    . ESOPHAGOGASTRODUODENOSCOPY (EGD) WITH PROPOFOL N/A 04/18/2017   Procedure: ESOPHAGOGASTRODUODENOSCOPY (EGD) WITH PROPOFOL;  Surgeon: [name], MD;  Location: [facility] ENDOSCOPY;  Service: Endoscopy;  Laterality: N/A;  . ESOPHAGOGASTRODUODENOSCOPY (EGD) WITH PROPOFOL N/A 11/29/2017   Procedure: ESOPHAGOGASTRODUODENOSCOPY (EGD) WITH PROPOFOL;  Surgeon: [name], MD;  Location[facility] ENDOSCOPY;  Service: Endoscopy;  Laterality: N/A;  . INSERT / REPLACE / REMOVE PACEMAKER    . PPM GENERATOR CHANGEOUT N/A 09/05/2019   Procedure: PPM GENERATOR CHANGEOUT;  Surgeon: [name], MD;   Location: [place] CV LAB;  Service: Cardiovascular;  Laterality: N/A;  . s/p pacer insertion    . TRANSANAL EXCISION OF RECTAL MASS N/A 01/17/2018   Procedure: TRANSANAL EXCISION OF RECTAL POLYP;  Surgeon: [name], MD;  Location: [facility] ORS;  Service: General;  Laterality: N/A;    Allergies: Allergies  Allergen Reactions  . Penicillins Anaphylaxis, Swelling and Rash    TOLERATES CEFTRIAXONE    Home Medications: Medications Prior to Admission  Medication Sig Dispense Refill Last Dose  . amiodarone (PACERONE) 200 MG tablet Take 1 tablet (200 mg total) by mouth daily. 90 tablet 2 Unknown at Unknown  . apixaban (ELIQUIS) 5 MG TABS tablet Take 1 tablet (5 mg total) by mouth 2 (two) times daily. 60 tablet 0 Unknown at Unknown  . cholecalciferol (VITAMIN D3) 25 MCG (1000 UNIT) tablet Take 1,000 Units by mouth daily.   Unknown at Unknown  . cholestyramine (QUESTRAN) 4 g packet Take 1 packet (4 g total) by mouth daily. in water   Unknown at Unknown  . donepezil (ARICEPT) 10 MG tablet Take 10 mg by mouth daily.    Unknown at Unknown  . gabapentin (NEURONTIN) 300 MG capsule Take 1 capsule (300 mg total) by mouth 2 (two) times daily. Take 300 mg in the morning and 600 mg at night (Patient taking differently: Take 300-600 mg by mouth 3 (three) times daily. Take 300 mg in the morning, 300 mg in the afternoon and 600 mg at night)   Unknown at Unknown  . levothyroxine (SYNTHROID) 125 MCG tablet Take 125 mcg by mouth daily.    Unknown at Unknown  . albuterol (VENTOLIN HFA) 108 (90 Base) MCG/ACT inhaler Inhale 2 puffs into the lungs every 6 (six) hours as needed for wheezing or shortness of breath. 8 g 0   . Ferrous Sulfate (SLOW FE) 142 (45 Fe) MG TBCR Take 1 tablet by mouth twice daily     . hydrocortisone (ANUSOL-HC) 25 MG suppository Place 1 suppository (25 mg total) rectally 2 (two) times daily. Okay to substitute generic 12 suppository 0   . hydrocortisone 2.5 % cream      . insulin detemir  (LEVEMIR) 100 UNIT/ML FlexPen Inject 5 Units into the skin every evening. 15 mL 0   . Insulin Pen Needle 31G X 8 MM MISC 1 Dose by Does not apply route at bedtime. 100 each 0   . LINZESS 72 MCG capsule Take 72 mcg by mouth every morning.     . liver oil-zinc oxide (DESITIN) 40 % ointment Apply topically 2 (two) times daily. Apply to buttock and groin ares 56.7 g 0   . loratadine (CLARITIN) 10 MG tablet Take 10 mg by mouth daily.      . meclizine (ANTIVERT) 12.5 MG tablet Take 1 tablet (12.5 mg total) by mouth 2 (two) times daily as needed for dizziness. [place]  tablet 0   . metoprolol succinate (TOPROL-XL) 25 MG 24 hr tablet Take 0.5 tablets (12.5 mg total) by mouth at bedtime. 30 tablet 0   . montelukast (SINGULAIR) 10 MG tablet Take 10 mg by mouth at bedtime.      . Multiple Vitamin (MULTIVITAMIN WITH MINERALS) TABS tablet Take 1 tablet by mouth daily.     . nitroGLYCERIN (NITROSTAT) 0.4 MG SL tablet Place 0.4 mg under the tongue every 5 (five) minutes as needed for chest pain.      [name] OLANZapine (ZYPREXA) 2.5 MG tablet Take 2.5 mg by mouth in the morning and at bedtime.      [name] omeprazole (PRILOSEC) 40 MG capsule Take 40 mg by mouth 2 (two) times daily.     . pioglitazone (ACTOS) 30 MG tablet Take 30 mg by mouth daily.     . potassium chloride SA (KLOR-CON) 20 MEQ tablet Take 2 tablets (40 mEq total) by mouth daily. 60 tablet 0   . rosuvastatin (CRESTOR) 40 MG tablet Take 40 mg by mouth daily.     . sertraline (ZOLOFT) 100 MG tablet Take 100 mg by mouth 2 (two) times daily.     . silver sulfADIAZINE (SILVADENE) 1 % cream Apply 1 application topically 2 (two) times daily. Apply to left heel wound  (please help her with dressing supplies to purchase.  I was thinking a 4x4 on the wound and then she could use a piece of a Kerlex to hold it in place so one Kerlex would last for multiple dressings) 50 g 0   . torsemide (DEMADEX) 20 MG tablet Take 2 tablets (40 mg) once daily in the AM & take 2 tablets (40  mg) on Mondays, Wednesdays, & Fridays at lunch time     . traMADol (ULTRAM) 50 MG tablet Take 1 tablet (50 mg total) by mouth every 8 (eight) hours as needed for moderate pain. 15 tablet 0   . traZODone (DESYREL) 100 MG tablet Take 1 tablet (100 mg total) by mouth at bedtime. 30 tablet 0   . vitamin B-12 (CYANOCOBALAMIN) 1000 MCG tablet Take 1,000 mcg by mouth daily.     . vitamin E 180 MG (400 UNITS) capsule Take 400 Units by mouth daily.      Home medication reconciliation was completed with the patient.   Scheduled Inpatient Medications:   . cholestyramine  4 g Oral Daily  . donepezil  10 mg Oral Daily  . gabapentin  300 mg Oral q AM  . gabapentin  600 mg Oral QHS  . insulin aspart  0-15 Units Subcutaneous TID WC  . insulin aspart  0-5 Units Subcutaneous QHS  . levothyroxine  125 mcg Oral Daily  . metoprolol succinate  12.5 mg Oral QHS  . OLANZapine  2.5 mg Oral BID  . potassium chloride  40 mEq Oral Q4H  . rosuvastatin  40 mg Oral Daily  . sertraline  100 mg Oral BID  . traZODone  100 mg Oral QHS    Continuous Inpatient Infusions:   . sodium chloride      PRN Inpatient Medications:  acetaminophen **OR** acetaminophen, nitroGLYCERIN, ondansetron **OR** ondansetron (ZOFRAN) IV, pneumococcal 23 valent vaccine, traMADol  Family History: family history includes Aneurysm in her mother; Cancer in her mother; Depression in her mother; Diabetes in her brother, brother, mother, and sister; Heart disease in her father; Hypertension in her mother.  The patient's family history is negative for inflammatory bowel disorders, GI malignancy, or solid organ  transplantation.  Social History:   reports that she has never smoked. She has never used smokeless tobacco. She reports that she does not drink alcohol and does not use drugs. The patient denies ETOH, tobacco, or drug use.   Review of Systems: Constitutional: Weight is stable.  Eyes: No changes in vision. ENT: No oral lesions, sore  throat.  GI: see HPI.  Heme/Lymph: No easy bruising.  CV: No chest pain.  GU: No hematuria.  Integumentary: No rashes.  Neuro: No headaches.  Psych: No depression/anxiety.  Endocrine: No heat/cold intolerance.  Allergic/Immunologic: No urticaria.  Resp: No cough, SOB.  Musculoskeletal: No joint swelling.    Physical Examination: BP (!) 107/51 (BP Location: Right Arm)   Pulse 60   Temp 97.6 F (36.4 C) (Oral)   Resp 16   Ht 5\' 6"  (1.676 m)   Wt 86 kg   SpO2 94%   BMI 30.60 kg/m  Gen: NAD, alert and oriented x 4 HEENT: PEERLA, EOMI, Neck: supple, no JVD or thyromegaly Chest: CTA bilaterally, no wheezes, crackles, or other adventitious sounds CV: RRR, no m/g/c/r Abd: soft, NT, ND, +BS in all four quadrants; no HSM, guarding, ridigity, or rebound tenderness Ext: no edema, well perfused with 2+ pulses, Skin: no rash or lesions noted Lymph: no LAD  Data: Lab Results  Component Value Date   WBC 3.4 (L) 01/29/2020   HGB 8.8 (L) 01/29/2020   HCT 28.2 (L) 01/29/2020   MCV 95.9 01/29/2020   PLT 134 (L) 01/29/2020   Recent Labs  Lab 01/28/20 1255 01/28/20 1715 01/29/20 0441  HGB 10.2* 10.1* 8.8*   Lab Results  Component Value Date   NA 141 01/29/2020   K 3.1 (L) 01/29/2020   CL 102 01/29/2020   CO2 33 (H) 01/29/2020   BUN 24 (H) 01/29/2020   CREATININE 0.94 01/29/2020   Lab Results  Component Value Date   ALT 18 01/29/2020   AST 23 01/29/2020   ALKPHOS 71 01/29/2020   BILITOT 0.5 01/29/2020   No results for input(s): APTT, INR, PTT in the last 168 hours.   Cancer of anal canal (CMS-HCC)  Dx'd on colonoscopy on 11/29/17. 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 S/p radiation with Dr. Baruch Gouty.    CTA ABD/PELVIS W/WO CONTRAST 01/28/2020: EXAM: CTA ABDOMEN AND PELVIS WITHOUT AND WITH CONTRAST  TECHNIQUE: Multidetector CT imaging of the abdomen  and pelvis was performed using the standard protocol during bolus administration of intravenous contrast. Multiplanar reconstructed images and MIPs were obtained and reviewed to evaluate the vascular anatomy.  CONTRAST:  65mL OMNIPAQUE IOHEXOL 350 MG/ML SOLN  COMPARISON:  10/23/2019  FINDINGS: VASCULAR  Aorta: Normal caliber aorta without aneurysm, dissection, vasculitis or significant stenosis. Mild diffuse atherosclerosis.  Celiac: Patent without evidence of aneurysm, dissection, vasculitis or significant stenosis.  SMA: Patent without evidence of aneurysm, dissection, vasculitis or significant stenosis.  Renals: Both renal arteries are patent without evidence of aneurysm, dissection, vasculitis, fibromuscular dysplasia or significant stenosis. Moderate atherosclerosis at the vessel origins.  IMA: Patent without evidence of aneurysm, dissection, vasculitis or significant stenosis.  Inflow: Moderate atherosclerosis without focal stenosis, aneurysm, or dissection.  Proximal Outflow: Bilateral common femoral and visualized portions of the superficial and profunda femoral arteries are patent without evidence of aneurysm, dissection, vasculitis or significant stenosis.  Veins: No obvious venous abnormality within the limitations of this arterial phase study.  Review of the MIP  images confirms the above findings.  NON-VASCULAR  Lower chest: No acute pleural or parenchymal lung disease. Small hiatal hernia.  Hepatobiliary: No focal liver abnormality is seen. No gallstones, gallbladder wall thickening, or biliary dilatation.  Pancreas: Unremarkable. No pancreatic ductal dilatation or surrounding inflammatory changes.  Spleen: Normal in size without focal abnormality.  Adrenals/Urinary Tract: Mild bilateral renal cortical atrophy. Otherwise the kidneys enhance normally. No urinary tract calculi or obstructive uropathy. The adrenals and bladder are  unremarkable.  Stomach/Bowel: No bowel obstruction or ileus. Normal appendix right lower quadrant. Diverticulosis of the distal colon without diverticulitis.  High attenuation stool is seen within the distal colon, which limits evaluation for active gastrointestinal bleeding. There is no evidence of intraluminal contrast accumulation to suggest active gastrointestinal bleeding.  There may be wall thickening along the left posterolateral aspect of the anal verge. Please correlate with physical exam.  Lymphatic: No pathologic adenopathy.  Reproductive: Status post hysterectomy. No adnexal masses.  Other: No free fluid or free gas.  No abdominal wall hernia.  Musculoskeletal: No acute or destructive bony lesions. Multilevel lumbar spondylosis. Reconstructed images demonstrate no additional findings.  IMPRESSION: VASCULAR  1. No evidence of active gastrointestinal bleeding. High attenuation stool within the distal colon does somewhat limit the evaluation. 2. Aortic Atherosclerosis (ICD10-I70.0). No evidence of significant stenosis, aneurysm, or dissection.  NON-VASCULAR  1. Possible wall thickening along the left posterolateral aspect of the anal verge, poorly evaluated by CT. Correlation with physical exam recommended. 2. Diverticulosis without diverticulitis. 3. Small hiatal hernia.  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 Rectal bleeding/Lower GI Bleeding - DDx includes anal outlet etiology, recurrence of anal cancer, colorectal polyp, AVMs, diverticular, solitary rectal ulcer syndrome, IBD, mucosal irritation, etc  2. Acute blood loss anemia - hemodynamically stable with no requirement for blood  transfusion. Hemoglobin 8.8. She has continued to have bleeding.   3. Hx of anal cancer - found on colonoscopy 11/2017 and s/p transanal excision and radiation 12/2017  4. Paroxsymal atrial fibrillation - Eliquis on hold. Lase dose Monday night.  5. IDDM  COVID-19 Test: NEGATIVE  Recommendations:  - Continue to monitor H&H closely. Transfuse for Hgb <7.0. - Continue IV fluid hydration and IV protonix - Advise endoluminal evaluation with colonoscopy for direct visualization. Discussed procedure details and indications with patient and she consents to proceed. - Clear liquid diet today. Bowel prep starting 1600 today. NPO after midnight - Plan for colonoscopy tomorrow morning with Dr. Alice Reichert - See procedure note for findings and further recommendations  I reviewed the risks (including bleeding, perforation, infection, anesthesia complications, cardiac/respiratory complications), benefits and alternatives of colonoscopy. Patient consents to proceed.    Thank you for the consult. Please call with questions or concerns.  Reeves Forth [facility] [phone] (Cell)

## 2020-01-30 ENCOUNTER — Inpatient Hospital Stay: Payer: Medicare Other | Admitting: Anesthesiology

## 2020-01-30 ENCOUNTER — Encounter
Admission: EM | Disposition: A | Discharge status: Home / Self Care | Payer: Self-pay | Source: Home / Self Care | Attending: Internal Medicine

## 2020-01-30 ENCOUNTER — Encounter: Payer: Self-pay | Admitting: Internal Medicine

## 2020-01-30 DIAGNOSIS — K625 Hemorrhage of anus and rectum: Secondary | ICD-10-CM | POA: Diagnosis not present

## 2020-01-30 HISTORY — PX: COLONOSCOPY: SHX5424

## 2020-01-30 LAB — BASIC METABOLIC PANEL
Anion gap: 8 (ref 5–15)
BUN: 13 mg/dL (ref 8–23)
CO2: 31 mmol/L (ref 22–32)
Calcium: 8.3 mg/dL — ABNORMAL LOW (ref 8.9–10.3)
Chloride: 103 mmol/L (ref 98–111)
Creatinine, Ser: 0.83 mg/dL (ref 0.44–1.00)
GFR, Estimated: >60 mL/min (ref >60)
Glucose, Bld: 135 mg/dL — ABNORMAL HIGH (ref 70–99)
Potassium: 3.1 mmol/L — ABNORMAL LOW (ref 3.5–5.1)
Sodium: 142 mmol/L (ref 135–145)

## 2020-01-30 LAB — CBC WITH DIFFERENTIAL/PLATELET
Abs Immature Granulocytes: 0.01 10*3/uL (ref 0.00–0.07)
Basophils Absolute: 0 10*3/uL (ref 0.0–0.1)
Basophils Relative: 1 %
Eosinophils Absolute: 0.1 10*3/uL (ref 0.0–0.5)
Eosinophils Relative: 3 %
HCT: 28.3 % — ABNORMAL LOW (ref 36.0–46.0)
Hemoglobin: 9.1 g/dL — ABNORMAL LOW (ref 12.0–15.0)
Immature Granulocytes: 0 %
Lymphocytes Relative: 35 %
Lymphs Abs: 1.6 10*3/uL (ref 0.7–4.0)
MCH: 30.7 pg (ref 26.0–34.0)
MCHC: 32.2 g/dL (ref 30.0–36.0)
MCV: 95.6 fL (ref 80.0–100.0)
Monocytes Absolute: 0.4 10*3/uL (ref 0.1–1.0)
Monocytes Relative: 9 %
Neutro Abs: 2.4 10*3/uL (ref 1.7–7.7)
Neutrophils Relative %: 52 %
Platelets: 143 10*3/uL — ABNORMAL LOW (ref 150–400)
RBC: 2.96 MIL/uL — ABNORMAL LOW (ref 3.87–5.11)
RDW: 16.9 % — ABNORMAL HIGH (ref 11.5–15.5)
WBC: 4.6 10*3/uL (ref 4.0–10.5)
nRBC: 0 % (ref 0.0–0.2)

## 2020-01-30 LAB — POTASSIUM: Potassium: 3.3 mmol/L — ABNORMAL LOW (ref 3.5–5.1)

## 2020-01-30 LAB — HEMOGLOBIN AND HEMATOCRIT, BLOOD
HCT: 30 % — ABNORMAL LOW (ref 36.0–46.0)
Hemoglobin: 9.5 g/dL — ABNORMAL LOW (ref 12.0–15.0)

## 2020-01-30 LAB — GLUCOSE, CAPILLARY: Glucose-Capillary: 126 mg/dL — ABNORMAL HIGH (ref 70–99)

## 2020-01-30 LAB — MAGNESIUM: Magnesium: 1.8 mg/dL (ref 1.7–2.4)

## 2020-01-30 SURGERY — COLONOSCOPY
Anesthesia: General

## 2020-01-30 MED ORDER — PROPOFOL 500 MG/50ML IV EMUL
INTRAVENOUS | Status: DC | PRN
  Administered 2020-01-30: 100 ug/kg/min via INTRAVENOUS

## 2020-01-30 MED ORDER — EPHEDRINE 5 MG/ML INJ
INTRAVENOUS | Status: AC
  Filled 2020-01-30: qty 10

## 2020-01-30 MED ORDER — PROPOFOL 10 MG/ML IV BOLUS
INTRAVENOUS | Status: AC
  Filled 2020-01-30: qty 20

## 2020-01-30 MED ORDER — PROPOFOL 10 MG/ML IV BOLUS
INTRAVENOUS | Status: DC | PRN
  Administered 2020-01-30: 40 mg via INTRAVENOUS

## 2020-01-30 MED ORDER — GABAPENTIN 300 MG PO CAPS
300.0000 mg | ORAL_CAPSULE | Freq: Three times a day (TID) | ORAL | Status: DC
Start: 2020-01-30 — End: 2020-05-21

## 2020-01-30 MED ORDER — POTASSIUM CHLORIDE CRYS ER 20 MEQ PO TBCR
40.0000 meq | EXTENDED_RELEASE_TABLET | Freq: Once | ORAL | Status: AC
  Administered 2020-01-30: 40 meq via ORAL
  Filled 2020-01-30: qty 2

## 2020-01-30 MED ORDER — POTASSIUM CHLORIDE CRYS ER 20 MEQ PO TBCR
40.0000 meq | EXTENDED_RELEASE_TABLET | ORAL | Status: DC
  Administered 2020-01-30: 40 meq via ORAL
  Filled 2020-01-30: qty 2

## 2020-01-30 MED ORDER — EPHEDRINE SULFATE 50 MG/ML IJ SOLN
INTRAMUSCULAR | Status: DC | PRN
  Administered 2020-01-30 (×2): 10 mg via INTRAVENOUS

## 2020-01-30 NOTE — Anesthesia Preprocedure Evaluation (Signed)
Anesthesia Evaluation  Patient identified by MRN, date of birth, ID band Patient awake    Reviewed: Allergy & Precautions, NPO status , Patient's Chart, lab work & pertinent test results  Airway Mallampati: II   Neck ROM: Full    Dental no notable dental hx.    Pulmonary pneumonia,    Pulmonary exam normal breath sounds clear to auscultation       Cardiovascular hypertension, + CAD, + Past MI, + Peripheral Vascular Disease and +CHF  Normal cardiovascular exam+ pacemaker  Rhythm:Regular Rate:Normal     Neuro/Psych  Headaches, PSYCHIATRIC DISORDERS Anxiety Depression Dementia TIA Neuromuscular disease CVA    GI/Hepatic Neg liver ROS, GERD  ,  Endo/Other  diabetesHypothyroidism   Renal/GU negative Renal ROS  negative genitourinary   Musculoskeletal  (+) Arthritis ,   Abdominal   Peds negative pediatric ROS (+)  Hematology negative hematology ROS (+)   Anesthesia Other Findings .Marland KitchenPast Medical History: No date: Anxiety No date: Arthritis No date: Broken arm     Comment:  left FA 3/17 No date: Broken arm     Comment:  left No date: CAD (coronary artery disease)     Comment:  s/p MI No date: Cancer ([place])     Comment:  uterine No date: Cervical disc disorder No date: Depression No date: Diabetes mellitus without complication (HCC) No date: GERD (gastroesophageal reflux disease) No date: Headache No date: Hyperlipidemia No date: Hypertension No date: Hypothyroid No date: Myocardial infarction [facility])     Comment:  15 year ago 06/04/2014: Neck pain No date: Parkinson's disease ([place]) No date: Presence of permanent cardiac pacemaker No date: Spinal stenosis   Reproductive/Obstetrics negative OB ROS                             Anesthesia Physical Anesthesia Plan  ASA: III  Anesthesia Plan: General   Post-op Pain Management:    Induction: Intravenous  PONV Risk Score and Plan: 3  and Propofol infusion  Airway Management Planned: Nasal Cannula  Additional Equipment: None  Intra-op Plan:   Post-operative Plan:   Informed Consent: I have reviewed the patients History and Physical, chart, labs and discussed the procedure including the risks, benefits and alternatives for the proposed anesthesia with the patient or authorized representative who has indicated his/her understanding and acceptance.       Plan Discussed with: CRNA, Anesthesiologist and Surgeon  Anesthesia Plan Comments:         Anesthesia Quick Evaluation

## 2020-01-30 NOTE — Discharge Summary (Signed)
Physician Discharge Summary  Kristi [name] EPP:[id number] DOB: 02-25-1941 DOA: 01/28/2020  PCP: Martin Majestic, FNP  Admit date: 01/28/2020 Discharge date: 01/30/2020  Discharge disposition: Home   Recommendations for Outpatient Follow-Up:   Follow-up with PCP in 1 week Follow-up with gastroenterologist in 1 month   Discharge Diagnosis:   Principal Problem:   Rectal bleed Active Problems:   Anxiety and depression   CAD (coronary artery disease)   Chronic knee pain (B) (R>L)   Anxiety   Cardiac pacemaker in situ   Hypothyroidism   Mixed hyperlipidemia   Mild dementia ([name])   Atrial fibrillation status post cardioversion [facility])   GI bleed   At risk for polypharmacy    Discharge Condition: Stable.  Diet recommendation:  Diet Order            Diet heart healthy/carb modified Room service appropriate? Yes; Fluid consistency: Thin  Diet effective now           Diet - low sodium heart healthy           Diet Carb Modified                   Code Status: Full Code     Hospital Course:   Kristi [name] is an 79 year old woman with medical history significant for anal cancer s/p transanal excision and radiation in October 2019, arthritis, coronary artery disease, history of MI approximately 15 years ago, history of uterine cancer, depression, anxiety, insulin-dependent diabetes mellitus, hyperlipidemia, hypertension, hypothyroid, presence of permanent cardiac pacemaker, Parkinson's disease, spinal stenosis.  She was referred to the emergency room by her gastroenterologist because of rectal bleeding of about 2 weeks duration.  She was admitted to the hospital for acute GI/rectal bleeding.  She was on Eliquis and this was held.  She was treated with IV Protonix and IV fluids.  She was evaluated by the gastroenterologist, Dr. Alice Reichert.  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  This was treated with argon plasma coagulation.   She had mild acute blood loss anemia but she did not require any blood transfusion.  H&H has remained stable.  She had hypokalemia that was treated as well.  She takes potassium supplements at home.  Her condition has improved and she is deemed stable for discharge to home today.  Case was discussed with Dr. Alice Reichert, gastroenterologist, and from his standpoint, patient is okay for discharge.  Of note, patient no longer takes amiodarone.  Chart review showed that she saw Dr. Caryl Comes, cardiologist/electrophysiologist on 01/21/2020 who recommended that amiodarone be discontinued.    Medical Consultants:    Gastroenterologist for colonoscopy   Discharge Exam:    Vitals:   01/30/20 0800 01/30/20 1323 01/30/20 1333 01/30/20 1343  BP: (!) 101/51 (!) 90/42 (!) 94/43 135/65  Pulse: 64 (!) 59 (!) 59 60  Resp: 16 16 20 18   Temp: 97.7 F (36.5 C) (!) 97 F (36.1 C)    TempSrc: Oral Temporal    SpO2: 96% 99% 100% 100%  Weight:      Height:         GEN: NAD SKIN: No rash EYES: EOMI ENT: MMM CV: RRR PULM: CTA B ABD: soft, ND, NT, +BS CNS: AAO x 3, non focal EXT: Trace b/l leg edema, no tenderness   The results of significant diagnostics from this hospitalization (including imaging, microbiology, ancillary and laboratory) are listed  below for reference.     Procedures and Diagnostic Studies:   CT Angio Abd/Pel w/ and/or w/o  Result Date: 01/28/2020 CLINICAL DATA:  Gastrointestinal bleeding, abdominal pain, rectal bleeding for 2 weeks EXAM: CTA ABDOMEN AND PELVIS WITHOUT AND WITH CONTRAST TECHNIQUE: Multidetector CT imaging of the abdomen and pelvis was performed using the standard protocol during bolus administration of intravenous contrast. Multiplanar reconstructed images and MIPs were obtained and reviewed to evaluate the vascular anatomy. CONTRAST:  58mL OMNIPAQUE IOHEXOL 350 MG/ML SOLN COMPARISON:   10/23/2019 FINDINGS: VASCULAR Aorta: Normal caliber aorta without aneurysm, dissection, vasculitis or significant stenosis. Mild diffuse atherosclerosis. Celiac: Patent without evidence of aneurysm, dissection, vasculitis or significant stenosis. SMA: Patent without evidence of aneurysm, dissection, vasculitis or significant stenosis. Renals: Both renal arteries are patent without evidence of aneurysm, dissection, vasculitis, fibromuscular dysplasia or significant stenosis. Moderate atherosclerosis at the vessel origins. IMA: Patent without evidence of aneurysm, dissection, vasculitis or significant stenosis. Inflow: Moderate atherosclerosis without focal stenosis, aneurysm, or dissection. Proximal Outflow: Bilateral common femoral and visualized portions of the superficial and profunda femoral arteries are patent without evidence of aneurysm, dissection, vasculitis or significant stenosis. Veins: No obvious venous abnormality within the limitations of this arterial phase study. Review of the MIP images confirms the above findings. NON-VASCULAR Lower chest: No acute pleural or parenchymal lung disease. Small hiatal hernia. Hepatobiliary: No focal liver abnormality is seen. No gallstones, gallbladder wall thickening, or biliary dilatation. Pancreas: Unremarkable. No pancreatic ductal dilatation or surrounding inflammatory changes. Spleen: Normal in size without focal abnormality. Adrenals/Urinary Tract: Mild bilateral renal cortical atrophy. Otherwise the kidneys enhance normally. No urinary tract calculi or obstructive uropathy. The adrenals and bladder are unremarkable. Stomach/Bowel: No bowel obstruction or ileus. Normal appendix right lower quadrant. Diverticulosis of the distal colon without diverticulitis. High attenuation stool is seen within the distal colon, which limits evaluation for active gastrointestinal bleeding. There is no evidence of intraluminal contrast accumulation to suggest active  gastrointestinal bleeding. There may be wall thickening along the left posterolateral aspect of the anal verge. Please correlate with physical exam. Lymphatic: No pathologic adenopathy. Reproductive: Status post hysterectomy. No adnexal masses. Other: No free fluid or free gas.  No abdominal wall hernia. Musculoskeletal: No acute or destructive bony lesions. Multilevel lumbar spondylosis. Reconstructed images demonstrate no additional findings. IMPRESSION: VASCULAR 1. No evidence of active gastrointestinal bleeding. High attenuation stool within the distal colon does somewhat limit the evaluation. 2. Aortic Atherosclerosis (ICD10-I70.0). No evidence of significant stenosis, aneurysm, or dissection. NON-VASCULAR 1. Possible wall thickening along the left posterolateral aspect of the anal verge, poorly evaluated by CT. Correlation with physical exam recommended. 2. Diverticulosis without diverticulitis. 3. Small hiatal hernia. Electronically Signed   By: Randa Ngo M.D.   On: 01/28/2020 18:14     Labs:   Basic Metabolic Panel: Recent Labs  Lab 01/28/20 1255 01/28/20 1255 01/29/20 0441 01/29/20 0441 01/30/20 0439 01/30/20 1528  NA 139  --  141  --  142  --   K 3.5   < > 3.1*   < > 3.1* 3.3*  CL 97*  --  102  --  103  --   CO2 30  --  33*  --  31  --   GLUCOSE 160*  --  97  --  135*  --   BUN 28*  --  24*  --  13  --   CREATININE 1.22*  --  0.94  --  0.83  --   CALCIUM  9.1  --  8.8*  --  8.3*  --   MG  --   --  2.2  --  1.8  --    < > = values in this interval not displayed.   GFR Estimated Creatinine Clearance: 58.7 mL/min (by C-G formula based on SCr of 0.83 mg/dL). Liver Function Tests: Recent Labs  Lab 01/28/20 1255 01/29/20 0441  AST 28 23  ALT 21 18  ALKPHOS 87 71  BILITOT 0.6 0.5  PROT 7.0 5.9*  ALBUMIN 3.7 3.2*   No results for input(s): LIPASE, AMYLASE in the last 168 hours. No results for input(s): AMMONIA in the last 168 hours. Coagulation profile No results for  input(s): INR, PROTIME in the last 168 hours.  CBC: Recent Labs  Lab 01/28/20 1255 01/28/20 1255 01/28/20 1715 01/29/20 0441 01/29/20 1600 01/30/20 0439 01/30/20 1528  WBC 4.1  --   --  3.4*  --  4.6  --   NEUTROABS  --   --   --   --   --  2.4  --   HGB 10.2*   < > 10.1* 8.8* 10.8* 9.1* 9.5*  HCT 31.1*   < > 32.0* 28.2* 33.7* 28.3* 30.0*  MCV 94.5  --   --  95.9  --  95.6  --   PLT 150  --   --  134*  --  143*  --    < > = values in this interval not displayed.   Cardiac Enzymes: No results for input(s): CKTOTAL, CKMB, CKMBINDEX, TROPONINI in the last 168 hours. BNP: Invalid input(s): POCBNP CBG: Recent Labs  Lab 01/29/20 0744 01/29/20 1140 01/29/20 1646 01/29/20 2134 01/30/20 0751  GLUCAP 86 95 93 156* 126*   D-Dimer No results for input(s): DDIMER in the last 72 hours. Hgb A1c Recent Labs    01/29/20 0441  HGBA1C 9.1*   Lipid Profile No results for input(s): CHOL, HDL, LDLCALC, TRIG, CHOLHDL, LDLDIRECT in the last 72 hours. Thyroid function studies No results for input(s): TSH, T4TOTAL, T3FREE, THYROIDAB in the last 72 hours.  Invalid input(s): FREET3 Anemia work up Recent Labs    01/29/20 0441  VITAMINB12 922*  FOLATE 30.0  FERRITIN 82  TIBC 290  IRON 66  RETICCTPCT 1.8   Microbiology Recent Results (from the past 240 hour(s))  Respiratory Panel by RT PCR (Flu A&B, Covid) - Nasopharyngeal Swab     Status: None   Collection Time: 01/28/20  7:30 PM   Specimen: Nasopharyngeal Swab  Result Value Ref Range Status   SARS Coronavirus 2 by RT PCR NEGATIVE NEGATIVE Final    Comment: (NOTE) SARS-CoV-2 target nucleic acids are NOT DETECTED.  The SARS-CoV-2 RNA is generally detectable in upper respiratoy specimens during the acute phase of infection. The lowest concentration of SARS-CoV-2 viral copies this assay can detect is 131 copies/mL. A negative result does not preclude SARS-Cov-2 infection and should not be used as the sole basis for treatment  or other patient management decisions. A negative result may occur with  improper specimen collection/handling, submission of specimen other than nasopharyngeal swab, presence of viral mutation(s) within the areas targeted by this assay, and inadequate number of viral copies (<131 copies/mL). A negative result must be combined with clinical observations, patient history, and epidemiological information. The expected result is Negative.  Fact Sheet for Patients:  PinkCheek.be  Fact Sheet for Healthcare Providers:  GravelBags.it  This test is no t yet approved or cleared by the [place]  FDA and  has been authorized for detection and/or diagnosis of SARS-CoV-2 by FDA under an Emergency Use Authorization (EUA). This EUA will remain  in effect (meaning this test can be used) for the duration of the COVID-19 declaration under Section 564(b)(1) of the Act, 21 U.S.C. section 360bbb-3(b)(1), unless the authorization is terminated or revoked sooner.     Influenza A by PCR NEGATIVE NEGATIVE Final   Influenza B by PCR NEGATIVE NEGATIVE Final    Comment: (NOTE) The Xpert Xpress SARS-CoV-2/FLU/RSV assay is intended as an aid in  the diagnosis of influenza from Nasopharyngeal swab specimens and  should not be used as a sole basis for treatment. Nasal washings and  aspirates are unacceptable for Xpert Xpress SARS-CoV-2/FLU/RSV  testing.  Fact Sheet for Patients: PinkCheek.be  Fact Sheet for Healthcare Providers: GravelBags.it  This test is not yet approved or cleared by the [place] FDA and  has been authorized for detection and/or diagnosis of SARS-CoV-2 by  FDA under an Emergency Use Authorization (EUA). This EUA will remain  in effect (meaning this test can be used) for the duration of the  Covid-19 declaration under Section 564(b)(1) of the Act, 21  U.S.C.  section 360bbb-3(b)(1), unless the authorization is  terminated or revoked. Performed at [facility], [address], [place], [place] [address]      Discharge Instructions:   Discharge Instructions    Diet - low sodium heart healthy   Complete by: As directed    Diet Carb Modified   Complete by: As directed    Increase activity slowly   Complete by: As directed      Allergies as of 01/30/2020      Reactions   Penicillins Anaphylaxis, Swelling, Rash   TOLERATES CEFTRIAXONE      Medication List    STOP taking these medications   amiodarone 200 MG tablet Commonly known as: PACERONE   hydrocortisone 2.5 % cream     TAKE these medications   albuterol 108 (90 Base) MCG/ACT inhaler Commonly known as: VENTOLIN HFA Inhale 2 puffs into the lungs every 6 (six) hours as needed for wheezing or shortness of breath.   apixaban 5 MG Tabs tablet Commonly known as: ELIQUIS Take 1 tablet (5 mg total) by mouth 2 (two) times daily.   cholecalciferol 25 MCG (1000 UNIT) tablet Commonly known as: VITAMIN D3 Take 1,000 Units by mouth daily.   cholestyramine 4 g packet Commonly known as: Questran Take 1 packet (4 g total) by mouth daily. in water   donepezil 10 MG tablet Commonly known as: ARICEPT Take 10 mg by mouth daily.   gabapentin 300 MG capsule Commonly known as: NEURONTIN Take 1-2 capsules (300-600 mg total) by mouth 3 (three) times daily. Take 300 mg in the morning, 300 mg in the afternoon and 600 mg at night   hydrocortisone 25 MG suppository Commonly known as: ANUSOL-HC Place 1 suppository (25 mg total) rectally 2 (two) times daily. Okay to substitute generic   insulin detemir 100 UNIT/ML FlexPen Commonly known as: LEVEMIR Inject 5 Units into the skin every evening.   Insulin Pen Needle 31G X 8 MM Misc 1 Dose by Does not apply route at bedtime.   levothyroxine 125 MCG tablet Commonly known as: SYNTHROID Take 125 mcg by mouth daily.   Linzess 72  MCG capsule Generic drug: linaclotide Take 72 mcg by mouth every morning.   liver oil-zinc oxide 40 % ointment Commonly known as: DESITIN Apply topically 2 (two)  times daily. Apply to buttock and groin ares   loratadine 10 MG tablet Commonly known as: CLARITIN Take 10 mg by mouth daily.   meclizine 12.5 MG tablet Commonly known as: ANTIVERT Take 1 tablet (12.5 mg total) by mouth 2 (two) times daily as needed for dizziness.   metoprolol succinate 25 MG 24 hr tablet Commonly known as: TOPROL-XL Take 0.5 tablets (12.5 mg total) by mouth at bedtime.   montelukast 10 MG tablet Commonly known as: SINGULAIR Take 10 mg by mouth at bedtime.   multivitamin with minerals Tabs tablet Take 1 tablet by mouth daily.   nitroGLYCERIN 0.4 MG SL tablet Commonly known as: NITROSTAT Place 0.4 mg under the tongue every 5 (five) minutes as needed for chest pain.   OLANZapine 2.5 MG tablet Commonly known as: ZYPREXA Take 2.5 mg by mouth in the morning and at bedtime.   omeprazole 40 MG capsule Commonly known as: PRILOSEC Take 40 mg by mouth 2 (two) times daily.   pioglitazone 30 MG tablet Commonly known as: ACTOS Take 30 mg by mouth daily.   potassium chloride SA 20 MEQ tablet Commonly known as: KLOR-CON Take 2 tablets (40 mEq total) by mouth daily.   rosuvastatin 40 MG tablet Commonly known as: CRESTOR Take 40 mg by mouth daily.   sertraline 100 MG tablet Commonly known as: ZOLOFT Take 100 mg by mouth 2 (two) times daily.   silver sulfADIAZINE 1 % cream Commonly known as: Silvadene Apply 1 application topically 2 (two) times daily. Apply to left heel wound  (please help her with dressing supplies to purchase.  I was thinking a 4x4 on the wound and then she could use a piece of a Kerlex to hold it in place so one Kerlex would last for multiple dressings)   Slow Fe 142 (45 Fe) MG Tbcr Generic drug: Ferrous Sulfate Take 1 tablet by mouth twice daily   torsemide 20 MG  tablet Commonly known as: DEMADEX Take 2 tablets (40 mg) once daily in the AM & take 2 tablets (40 mg) on Mondays, Wednesdays, & Fridays at lunch time   traMADol 50 MG tablet Commonly known as: Ultram Take 1 tablet (50 mg total) by mouth every 8 (eight) hours as needed for moderate pain.   traZODone 100 MG tablet Commonly known as: DESYREL Take 1 tablet (100 mg total) by mouth at bedtime.   vitamin B-12 1000 MCG tablet Commonly known as: CYANOCOBALAMIN Take 1,000 mcg by mouth daily.   vitamin E 180 MG (400 UNITS) capsule Take 400 Units by mouth daily.       Follow-up Information    Martin Majestic, FNP Follow up.   Specialty: Family Medicine Why: Please schedule hospital follow up appt within 3-5 days of discharge. Contact information: [place] [address] [phone]                Time coordinating discharge: 33 minutes  Signed:  Jennye Boroughs  [facility] 01/30/2020, 4:00 PM   Pager on [email or web address]. If 7PM-7AM, please contact night-coverage at [email or web address]

## 2020-01-30 NOTE — Anesthesia Postprocedure Evaluation (Signed)
Anesthesia Post Note  [name]  Procedure(s) Performed: COLONOSCOPY (N/A )  Patient location during evaluation: Endoscopy Anesthesia Type: General Level of consciousness: awake Pain management: pain level controlled Vital Signs Assessment: post-procedure vital signs reviewed and stable Respiratory status: spontaneous breathing and nonlabored ventilation Cardiovascular status: blood pressure returned to baseline and stable Postop Assessment: no apparent nausea or vomiting Anesthetic complications: no   No complications documented.   Last Vitals:  Vitals:   01/30/20 0800 01/30/20 1323  BP: (!) 101/51 (!) (P) 90/42  Pulse: 64 (!) (P) 59  Resp: 16   Temp: 36.5 C (!) (P) 36.1 C  SpO2: 96%     Last Pain:  Vitals:   01/30/20 1323  TempSrc: (P) Temporal  PainSc:                  Neva Seat

## 2020-01-30 NOTE — TOC Transition Note (Signed)
Transition of Care [facility]) - CM/SW Discharge Note   Patient Details  Name: Kristi [name] MRN: [id number] Date of Birth: 09/28/1940  [facility] CM/SW Contact:  Candie Chroman, LCSW Phone Number: 01/30/2020, 4:07 PM   Clinical Narrative: Patient has orders to discharge home today. No further concerns. CSW signing off.    Final next level of care: Home/Self Care Barriers to Discharge: No Barriers Identified   Patient Goals and CMS Choice        Discharge Placement                    Patient and family notified of of transfer: 01/30/20  Discharge Plan and Services     Post Acute Care Choice: Resumption of Svcs/PTA Provider                               Social Determinants of Health (SDOH) Interventions     Readmission Risk Interventions Readmission Risk Prevention Plan 01/30/2020 10/24/2019  Transportation Screening Complete Complete  Medication Review Press photographer) Complete Complete  PCP or Specialist appointment within 3-5 days of discharge Complete Complete  HRI or Home Care Consult Complete -  SW Recovery Care/Counseling Consult Complete Complete  Palliative Care Screening Not Applicable Not [place] Not Applicable Not Applicable  Some recent data might be hidden

## 2020-01-30 NOTE — Interval H&P Note (Signed)
History and Physical Interval Note:  01/30/2020 12:54 PM  Kristi [name]  has presented today for surgery, with the diagnosis of Rectal bleeding, Lower GI bleeding, Hx of anal cancer.  The various methods of treatment have been discussed with the patient and family. After consideration of risks, benefits and other options for treatment, the patient has consented to  Procedure(s): COLONOSCOPY (N/A) as a surgical intervention.  The patient's history has been reviewed, patient examined, no change in status, stable for surgery.  I have reviewed the patient's chart and labs.  Questions were answered to the patient's satisfaction.     [place], [place]

## 2020-01-30 NOTE — Care Management Important Message (Signed)
Important Message  Patient Details  Name: Kristi [name] MRN: [id number] Date of Birth: April 06, 1940   Medicare Important Message Given:  N/A - LOS <3 / Initial given by admissions  Initial Medicare IM reviewed with patient's daughter, Lamar Benes by Meredith Mody, Patient Access Associate on 01/30/2020 at 10:35am.     Dannette Barbara 01/30/2020, 2:50 PM

## 2020-01-30 NOTE — Addendum Note (Signed)
Addendum  created 01/30/20 1326 by Neva Seat, DO   Intraprocedure Staff edited

## 2020-01-30 NOTE — Op Note (Signed)
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 Providers:             Benay Pike. Alice Reichert MD, MD Referring MD:          Lorie Apley. Jimmye Norman (Referring MD) Medicines:             Propofol per Anesthesia Complications:         No immediate complications. Estimated blood loss:                         Minimal. Procedure:             Pre-Anesthesia Assessment:                        - The risks and benefits of the procedure and the                         sedation options and risks were discussed with the                         patient. All questions were answered and informed                         consent was obtained.                        - Patient identification and proposed procedure were                         verified prior to the procedure by the nurse. The                         procedure was verified in the procedure room.                        - ASA Grade Assessment: III - A patient with severe                         systemic disease.                        - After reviewing the risks and benefits, the patient                         was deemed in satisfactory condition to undergo the                         procedure.  After obtaining informed consent, the colonoscope was                         passed under direct vision. Throughout the procedure,                         the patient's blood pressure, pulse, and oxygen                         saturations were monitored continuously. The                          Colonoscope was introduced through the anus and                         advanced to the the cecum, identified by appendiceal                         orifice and ileocecal valve. The colonoscopy was                         performed without difficulty. The patient tolerated                         the procedure well. The quality of the bowel                         preparation was good. The ileocecal valve, appendiceal                         orifice, and rectum were photographed. Findings:      The perianal and digital rectal examinations were normal. Pertinent       negatives include normal sphincter tone and no palpable rectal lesions.      Many small and large-mouthed diverticula were found in the entire colon.      Multiple small patchy angioectasias with bleeding were found in the       distal rectum. Coagulation for hemostasis using argon plasma at 0.8       liters/minute and 20 watts was successful. Estimated blood loss was       minimal.      The exam was otherwise without abnormality on direct and retroflexion       views.      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 Estimated       blood loss was minimal. Impression:            - Diverticulosis in the entire examined colon.                        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 Multiple bleeding distal rectum                         angioectasias. Treated with argon plasma coagulation                         (APC).                        -  The examination was otherwise normal on direct and                         retroflexion views.                        - No specimens collected. Recommendation:        - Return patient to hospital ward for possible                         discharge same day.                        - Advance diet as tolerated.                        - Continue present medications.                        - Return to nurse  practitioner in 1 month.                        - Follow up with Stephens November, GI Nurse                         Practioner, in office to discuss results and monitor                         progress.                        - The findings and recommendations were discussed with                         the patient. Procedure Code(s):     --- Professional ---                        [phone], Colonoscopy, flexible; with control of                         bleeding, any method Diagnosis Code(s):     --- Professional ---                        K57.30, Diverticulosis of large intestine without                         perforation or abscess without bleeding                        D62, Acute posthemorrhagic anemia                        K92.1, Melena (includes Hematochezia)                        K55.21, Angiodysplasia of colon with hemorrhage CPT copyright 2019 American Medical Association. All rights reserved. The codes documented in this report are preliminary and upon coder review may  be revised to meet current compliance requirements. Efrain Sella MD, MD 01/30/2020 1:29:53 PM This report has been signed electronically. Number of Addenda: 0 Note  Initiated On: 01/30/2020 11:36 AM Scope Withdrawal Time: 0 hours 13 minutes 36 seconds  Total Procedure Duration: 0 hours 20 minutes 8 seconds  Estimated Blood Loss:  Estimated blood loss was minimal. Estimated blood loss                         was minimal.      [facility]

## 2020-01-30 NOTE — Transfer of Care (Signed)
Immediate Anesthesia Transfer of Care Note  [name]  Procedure(s) Performed: COLONOSCOPY (N/A )  Patient Location: Endo  Anesthesia Type:General  Level of Consciousness: drowsy  Airway & Oxygen Therapy: Patient Spontanous Breathing  Post-op Assessment: Report given to RN  Post vital signs: stable  Last Vitals:  Vitals Value Taken Time  BP 90/42 01/30/20 1323  Temp    Pulse 59 01/30/20 1324  Resp 15 01/30/20 1324  SpO2 99 % 01/30/20 1324  Vitals shown include unvalidated device data.  Last Pain:  Vitals:   01/30/20 0800  TempSrc: Oral  PainSc: 0-No pain         Complications: No complications documented.

## 2020-01-30 NOTE — TOC Initial Note (Signed)
Transition of Care [facility]) - Initial/Assessment Note    Patient Details  Name: Kristi [name] MRN: [id number] Date of Birth: 1940/10/24  [facility] CM/SW Contact:    Candie Chroman, LCSW Phone Number: 01/30/2020, 10:02 AM  Clinical Narrative: Readmission prevention screen complete. CSW met with patient. No supports at bedside. CSW introduced role and explained that discharge planning would be discussed. PCP is Gordy Clement, FNP at [facility] in [place]. Patient takes the bus to appointments and said it has been a reliable source of transportation for her. Pharmacy is [place]. No issues obtaining medications. Patient lives home alone but has an aide through [employer] that comes for 4 hours Monday and Tuesday and 2 hours and 45 minutes Wednesday-Friday. She assists with ADL's and housework. Patient has a rollator, wheelchair, and bedside commode at home. No further concerns. CSW encouraged patient to contact CSW as needed. CSW will continue to follow patient for support and facilitate return home when stable.                Expected Discharge Plan: [place] (Personal care services.) Barriers to Discharge: Continued Medical Work up   Patient Goals and CMS Choice        Expected Discharge Plan and Services Expected Discharge Plan: [place] (Personal care services.)     Post Acute Care Choice: Resumption of Svcs/PTA Provider Living arrangements for the past 2 months: Single Family Home                                      Prior Living Arrangements/Services Living arrangements for the past 2 months: Single Family Home Lives with:: Self Patient language and need for interpreter reviewed:: Yes Do you feel safe going back to the place where you live?: Yes      Need for Family Participation in Patient Care: Yes (Comment) Care giver support system in place?: Yes (comment) Current home services:  DME, Other (comment) (PCS) Criminal Activity/Legal Involvement Pertinent to Current Situation/Hospitalization: No - Comment as needed  Activities of Daily Living Home Assistive Devices/Equipment: Walker (specify type) ADL Screening (condition at time of admission) Patient's cognitive ability adequate to safely complete daily activities?: Yes Is the patient deaf or have difficulty hearing?: No Does the patient have difficulty seeing, even when wearing glasses/contacts?: No Does the patient have difficulty concentrating, remembering, or making decisions?: No Patient able to express need for assistance with ADLs?: Yes Does the patient have difficulty dressing or bathing?: No Independently performs ADLs?: No (aide comes in to help ) Dressing (OT): Independent Grooming: Independent Feeding: Independent Bathing: Independent Toileting: Independent In/Out Bed: Independent Walks in Home: Independent Does the patient have difficulty walking or climbing stairs?: No Weakness of Legs: None Weakness of Arms/Hands: None  Permission Sought/Granted                  Emotional Assessment Appearance:: Appears stated age Attitude/Demeanor/Rapport: Engaged, Gracious Affect (typically observed): Accepting, Appropriate, Calm, Flat, Pleasant Orientation: : Oriented to Self, Oriented to Place, Oriented to  Time, Oriented to Situation Alcohol / Substance Use: Not Applicable Psych Involvement: No (comment)  Admission diagnosis:  GI bleed [K92.2] Lower GI bleed [K92.2] Anticoagulated [Z79.01] Rectal bleed [K62.5] Rectal bleeding [K62.5] Patient Active Problem List   Diagnosis Date Noted  . GI bleed 01/28/2020  . Rectal bleed 01/28/2020  . At risk  for polypharmacy 01/28/2020  . Acute delirium   . HCAP (healthcare-associated pneumonia)   . Persistent atrial fibrillation ([place])   . Hypotension   . Hyponatremia   . Lobar pneumonia ([place]) 10/23/2019  . Atrial flutter ([place])   . Dizziness   .  Atrial fibrillation status post cardioversion ([place]) 09/27/2019  . CHF (congestive heart failure) ([place]) 09/26/2019  . Acute on chronic diastolic heart failure ([place]) 09/25/2019  . Hypertensive urgency 09/25/2019  . Fluid overload 09/25/2019  . Excessive gas 05/14/2019  . Generalized bloating 05/14/2019  . Bright red blood per rectum 05/14/2019  . 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 Chest pain 11/20/2018  . Difficulty walking 11/19/2018  . Insomnia 11/15/2018  . Type 2 diabetes mellitus with hyperlipidemia ([place]) 11/15/2018  . Goals of care, counseling/discussion 02/06/2018  . Cancer of anal canal ([place]) 01/25/2018  . Rectal mass 01/05/2018  . Leg pain, bilateral 12/06/2017  . Lymphedema 07/10/2017  . Atherosclerosis of native arteries of the extremities with ulceration ([place]) 07/10/2017  . Stricture and stenosis of esophagus   . Dysphagia   . Syncope and collapse 04/11/2017  . Mild dementia ([place]) 01/24/2017  . Lumbar facet osteoarthritis (Bilateral) 01/17/2017  . Acute postoperative pain 01/17/2017  . Hypokalemia 12/24/2016  . Anxiety 12/07/2016  . 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 Cerebrovascular accident ([place]) 12/07/2016  . Chronic depression 12/07/2016  . Essential hypertension 12/07/2016  . Hypothyroidism 12/07/2016  . Mixed hyperlipidemia 12/07/2016  . Myocardial infarction ([place]) 12/07/2016  . TIA (transient ischemic attack) 11/17/2016  . Parkinson disease ([place]) 10/27/2016  . Tremor 10/27/2016  . Chronic knee pain (B) (R>L) 10/19/2016  . Ischemic chest pain ([place]) 09/29/2016  . Abnormal CT scan, lumbar spine 09/08/2016  . Lumbar foraminal stenosis (multilevel) 09/08/2016  . Fracture of clavicle 08/03/2016  . Polyneuropathy 05/17/2016  . Neurogenic pain 04/12/2016  . Chronic lower extremity pain (Bilateral) (R>L) 04/12/2016  . Chronic lower extremity radicular pain (Primary Source of Pain) (Bilateral) (R>L)  04/12/2016  . Lower extremity weakness 04/12/2016  . Chronic low back pain (Secondary source of pain) (Bilateral) (R>L) 04/12/2016  . Chronic wrist pain (Left) (secondary to fracture) 04/12/2016  . Lumbar spondylosis 04/12/2016  . Chronic pain syndrome 04/11/2016  . Long term current use of opiate analgesic 04/11/2016  . Long term prescription opiate use 04/11/2016  . Opiate use 04/11/2016  . Long term prescription benzodiazepine use 04/11/2016  . Lumbar radiculopathy (Multilevel) (Bilateral) 05/07/2015  . Lumbar facet syndrome (Bilateral) (R>L) 08/21/2014  . 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 Sacroiliac joint dysfunction (Bilateral) 08/13/2014  . Frequent falls 06/04/2014  . Memory loss 06/04/2014  . Anxiety and depression 08/22/2013  . CAD (coronary artery disease) 08/22/2013  . Therapeutic opioid-induced constipation (OIC) 08/22/2013  . GERD (gastroesophageal reflux disease) 08/22/2013   PCP:  Martin Majestic, FNP Pharmacy:   [facility], [address] [place] [address] Phone: [phone] Fax: [place], [place] [address] [place] [address] Phone: [phone] Fax: [phone]  [place], [place] [address] Phone: [phone] Fax: [phone]     Social Determinants of Health (SDOH) Interventions    Readmission Risk Interventions Readmission Risk Prevention Plan 01/30/2020 10/24/2019  Transportation Screening Complete Complete  Medication Review (RN Care  Manager) Complete Complete  PCP or Specialist appointment within 3-5 days of discharge Complete Complete  HRI or Home Care Consult Complete -  SW Recovery Care/Counseling Consult Complete Complete  Palliative Care Screening Not Applicable Not [place]  Not Applicable Not Applicable  Some recent data might be hidden

## 2020-01-30 NOTE — Progress Notes (Signed)
Mobility Specialist - Progress Note   01/30/20 1352  Mobility  Activity Off unit  Mobility performed by Mobility specialist    Per chart review, pt currently OOF for colonoscopy procedure. Will attempt session at another date/time.    Kathee Delton Mobility Specialist 01/30/20, 1:53 PM

## 2020-02-04 ENCOUNTER — Telehealth: Payer: Self-pay | Admitting: *Deleted

## 2020-02-04 NOTE — Telephone Encounter (Signed)
Attempted to call pt lab results (TSH). LMOM TCB.

## 2020-02-04 NOTE — Telephone Encounter (Signed)
-----   Message from [name], MD sent at 02/03/2020  5:09 PM EST ----- Please Inform Patient that labs are normal  Thanks

## 2020-02-05 NOTE — Telephone Encounter (Signed)
I spoke with the patient regarding her most recent normal TSH results.   She voices understanding.

## 2020-02-24 ENCOUNTER — Encounter: Payer: Self-pay | Admitting: Cardiology

## 2020-02-24 ENCOUNTER — Ambulatory Visit (INDEPENDENT_AMBULATORY_CARE_PROVIDER_SITE_OTHER): Payer: Medicare Other | Admitting: Cardiology

## 2020-02-24 ENCOUNTER — Other Ambulatory Visit: Payer: Self-pay

## 2020-02-24 VITALS — BP 104/64 | HR 128 | Ht 66.0 in | Wt 190.0 lb

## 2020-02-24 DIAGNOSIS — I5030 Unspecified diastolic (congestive) heart failure: Secondary | ICD-10-CM | POA: Diagnosis not present

## 2020-02-24 DIAGNOSIS — Z950 Presence of cardiac pacemaker: Secondary | ICD-10-CM | POA: Diagnosis not present

## 2020-02-24 DIAGNOSIS — I480 Paroxysmal atrial fibrillation: Secondary | ICD-10-CM | POA: Diagnosis not present

## 2020-02-24 DIAGNOSIS — Z951 Presence of aortocoronary bypass graft: Secondary | ICD-10-CM | POA: Diagnosis not present

## 2020-02-24 NOTE — Progress Notes (Signed)
Cardiology Office Note:    Date:  02/24/2020   ID:  Kristi [name], DOB 1940/07/17, MRN [id number]  PCP:  Martin Majestic, FNP  Cardiologist:  Kate Sable, MD  Electrophysiologist:  None   Referring MD: Martin Majestic, *   Chief Complaint  Patient presents with  . Follow-up    Pt states she has been feeling very weak and tired, "short-winded."    History of Present Illness:    Kristi [name] is a 79 y.o. female with a hx of diabetes, HFpEF, parox A. fib on Eliquis hypertension, hyperlipidemia, CAD, prior PCI x5 stents,CABGx3 over 15 years ago, permanent pacemaker, lymphedema who presents for follow-up.    Being seen for A. fib, CAD, heart failure preserved ejection fraction.  Torsemide dose was increased by EP after pacemaker check.  Still with some swelling, varicose veins in lower extremity.  Has appointment with vein clinic in about a month.  States feeling tired/fatigue over the past several days.  Denies any illness such as fevers, chills, diarrhea, vomiting.  TSH obtained a month ago was normal.   Prior notes  She sleeps on a hospital bed at home and has required to place it on an incline.   Patient has a history of bilateral chronic venous insufficiency in the deep vein system of the lower extremity, and lymphedema, being seen at the vein clinic.  Has compression stockings and lymphedema pump device prescribed by specialist.    Echocardiogram on 11/2018 showed low normal ejection fraction with EF 50 to 55%, pseudonormal diastolic function.  Stress Myoview in 10/07/2016 was normal. Pacemaker had inverted atrial and ventricular leads status post revision on 6/21.  A. fib during hospital admission 10/2019.  Status post DC cardioversion 10/24/2019.  Amiodarone stopped due to GI issues.  Past Medical History:  Diagnosis Date  . Anxiety   . Arthritis   . Broken arm    left FA 3/17  . Broken arm    left  . CAD (coronary artery disease)    s/p MI  . Cancer ([place])     uterine  . Cervical disc disorder   . Depression   . Diabetes mellitus without complication ([place])   . GERD (gastroesophageal reflux disease)   . Headache   . Hyperlipidemia   . Hypertension   . Hypothyroid   . Myocardial infarction ([place])    15 year ago  . Neck pain 06/04/2014  . Parkinson's disease ([place])   . Presence of permanent cardiac pacemaker   . Spinal stenosis     Past Surgical History:  Procedure Laterality Date  . ABDOMINAL HYSTERECTOMY     partial  . BREAST SURGERY    . CARDIOVERSION N/A 10/24/2019   Procedure: CARDIOVERSION;  Surgeon: Wellington Hampshire, MD;  Location: [facility] ORS;  Service: Cardiovascular;  Laterality: N/A;  . COLONOSCOPY    . COLONOSCOPY N/A 01/30/2020   Procedure: COLONOSCOPY;  Surgeon: Toledo, Benay Pike, MD;  Location: [facility] ENDOSCOPY;  Service: Gastroenterology;  Laterality: N/A;  . COLONOSCOPY WITH PROPOFOL N/A 11/29/2017   Procedure: COLONOSCOPY WITH PROPOFOL;  Surgeon: [name], MD;  Location: [facility] ENDOSCOPY;  Service: Endoscopy;  Laterality: N/A;  . CORONARY ANGIOPLASTY     stent  . CORONARY ARTERY BYPASS GRAFT    . CORONARY STENT PLACEMENT    . ESOPHAGOGASTRODUODENOSCOPY (EGD) WITH PROPOFOL N/A 04/18/2017   Procedure: ESOPHAGOGASTRODUODENOSCOPY (EGD) WITH PROPOFOL;  Surgeon: [name], MD;  Location: [facility] ENDOSCOPY;  Service: Endoscopy;  Laterality: N/A;  . ESOPHAGOGASTRODUODENOSCOPY (  EGD) WITH PROPOFOL N/A 11/29/2017   Procedure: ESOPHAGOGASTRODUODENOSCOPY (EGD) WITH PROPOFOL;  Surgeon: [name], MD;  Location: [facility] ENDOSCOPY;  Service: Endoscopy;  Laterality: N/A;  . INSERT / REPLACE / REMOVE PACEMAKER    . PPM GENERATOR CHANGEOUT N/A 09/05/2019   Procedure: PPM GENERATOR CHANGEOUT;  Surgeon: [name], MD;  Location: [place] CV LAB;  Service: Cardiovascular;  Laterality: N/A;  . s/p pacer insertion    . TRANSANAL EXCISION OF RECTAL MASS N/A 01/17/2018   Procedure: TRANSANAL EXCISION OF RECTAL POLYP;  Surgeon:  [name], MD;  Location: [facility] ORS;  Service: General;  Laterality: N/A;    Current Medications: Current Meds  Medication Sig  . albuterol (VENTOLIN HFA) 108 (90 Base) MCG/ACT inhaler Inhale 2 puffs into the lungs every 6 (six) hours as needed for wheezing or shortness of breath.  [name] apixaban (ELIQUIS) 5 MG TABS tablet Take 1 tablet (5 mg total) by mouth 2 (two) times daily.  . cholecalciferol (VITAMIN D3) 25 MCG (1000 UNIT) tablet Take 1,000 Units by mouth daily.  . cholestyramine (QUESTRAN) 4 g packet Take 1 packet (4 g total) by mouth daily. in water  . donepezil (ARICEPT) 10 MG tablet Take 10 mg by mouth daily.   . Ferrous Sulfate (SLOW FE) 142 (45 Fe) MG TBCR Take 1 tablet by mouth twice daily  . gabapentin (NEURONTIN) 300 MG capsule Take 1-2 capsules (300-600 mg total) by mouth 3 (three) times daily. Take 300 mg in the morning, 300 mg in the afternoon and 600 mg at night  . hydrocortisone (ANUSOL-HC) 25 MG suppository Place 1 suppository (25 mg total) rectally 2 (two) times daily. Okay to substitute generic  . insulin detemir (LEVEMIR) 100 UNIT/ML FlexPen Inject 5 Units into the skin every evening.  . Insulin Pen Needle 31G X 8 MM MISC 1 Dose by Does not apply route at bedtime.  [name] levothyroxine (SYNTHROID) 125 MCG tablet Take 125 mcg by mouth daily.   [name] LINZESS 72 MCG capsule Take 72 mcg by mouth every morning.  . liver oil-zinc oxide (DESITIN) 40 % ointment Apply topically 2 (two) times daily. Apply to buttock and groin ares  . loratadine (CLARITIN) 10 MG tablet Take 10 mg by mouth daily.   . meclizine (ANTIVERT) 12.5 MG tablet Take 1 tablet (12.5 mg total) by mouth 2 (two) times daily as needed for dizziness.  . metoprolol succinate (TOPROL-XL) 25 MG 24 hr tablet Take 0.5 tablets (12.5 mg total) by mouth at bedtime.  . montelukast (SINGULAIR) 10 MG tablet Take 10 mg by mouth at bedtime.   . Multiple Vitamin (MULTIVITAMIN WITH MINERALS) TABS tablet Take 1 tablet by mouth daily.   . nitroGLYCERIN (NITROSTAT) 0.4 MG SL tablet Place 0.4 mg under the tongue every 5 (five) minutes as needed for chest pain.   [name] OLANZapine (ZYPREXA) 2.5 MG tablet Take 2.5 mg by mouth in the morning and at bedtime.   [name] omeprazole (PRILOSEC) 40 MG capsule Take 40 mg by mouth 2 (two) times daily.  . pioglitazone (ACTOS) 30 MG tablet Take 30 mg by mouth daily.  . potassium chloride SA (KLOR-CON) 20 MEQ tablet Take 2 tablets (40 mEq total) by mouth daily.  . rosuvastatin (CRESTOR) 40 MG tablet Take 40 mg by mouth daily.  . silver sulfADIAZINE (SILVADENE) 1 % cream Apply 1 application topically 2 (two) times daily. Apply to left heel wound  (please help her with dressing supplies to purchase.  I was thinking a  4x4 on the wound and then she could use a piece of a Kerlex to hold it in place so one Kerlex would last for multiple dressings)  . torsemide (DEMADEX) 20 MG tablet Take 2 tablets (40 mg) once daily in the AM & take 2 tablets (40 mg) on Mondays, Wednesdays, & Fridays at lunch time  . traMADol (ULTRAM) 50 MG tablet Take 1 tablet (50 mg total) by mouth every 8 (eight) hours as needed for moderate pain.  . traZODone (DESYREL) 100 MG tablet Take 1 tablet (100 mg total) by mouth at bedtime.  . vitamin B-12 (CYANOCOBALAMIN) 1000 MCG tablet Take 1,000 mcg by mouth daily.  . vitamin E 180 MG (400 UNITS) capsule Take 400 Units by mouth daily.     Allergies:   Penicillins   Social History   Socioeconomic History  . Marital status: Widowed    Spouse name: Not on file  . Number of children: Not on file  . Years of education: Not on file  . Highest education level: Not on file  Occupational History  . Occupation: retired  Tobacco Use  . Smoking status: Never Smoker  . Smokeless tobacco: Never Used  Vaping Use  . Vaping Use: Never used  Substance and Sexual Activity  . Alcohol use: No  . Drug use: No  . Sexual activity: Never  Other Topics Concern  . Not on file  Social History Narrative   . Not on file   Social Determinants of Health   Financial Resource Strain:   . Difficulty of Paying Living Expenses: Not on file  Food Insecurity:   . Worried About Charity fundraiser in the Last Year: Not on file  . Ran Out of Food in the Last Year: Not on file  Transportation Needs:   . Lack of Transportation (Medical): Not on file  . Lack of Transportation (Non-Medical): Not on file  Physical Activity:   . Days of Exercise per Week: Not on file  . Minutes of Exercise per Session: Not on file  Stress:   . Feeling of Stress : Not on file  Social Connections:   . Frequency of Communication with Friends and Family: Not on file  . Frequency of Social Gatherings with Friends and Family: Not on file  . Attends Religious Services: Not on file  . Active Member of Clubs or Organizations: Not on file  . Attends Archivist Meetings: Not on file  . Marital Status: Not on file     Family History: The patient's family history includes Aneurysm in her mother; Cancer in her mother; Depression in her mother; Diabetes in her brother, brother, mother, and sister; Heart disease in her father; Hypertension in her mother.  ROS:   Please see the history of present illness.     All other systems reviewed and are negative.  EKGs/Labs/Other Studies Reviewed:    The following studies were reviewed today:   EKG:  EKG is  ordered today.  The ekg ordered today demonstrates atrial paced rhythm, V paced rhythm  Recent Labs: 09/29/2019: B Natriuretic Peptide 359.9 01/21/2020: TSH 0.954 01/29/2020: ALT 18 01/30/2020: BUN 13; Creatinine, Ser 0.83; Hemoglobin 9.5; Magnesium 1.8; Platelets 143; Potassium 3.3; Sodium 142  Recent Lipid Panel    Component Value Date/Time   CHOL 131 10/25/2019 0600   TRIG 78 10/25/2019 0600   HDL 72 10/25/2019 0600   CHOLHDL 1.8 10/25/2019 0600   VLDL 16 10/25/2019 0600   LDLCALC 43 10/25/2019 0600  Physical Exam:    VS:  BP 104/64   Pulse (!) 128    Ht 5\' 6"  (1.676 m)   Wt 190 lb (86.2 kg)   BMI 30.67 kg/m     Wt Readings from Last 3 Encounters:  02/24/20 190 lb (86.2 kg)  01/28/20 189 lb 9.5 oz (86 kg)  01/21/20 186 lb (84.4 kg)     GEN:  Well nourished, well developed in no acute distress HEENT: Normal NECK: No JVD; No carotid bruits LYMPHATICS: No lymphadenopathy CARDIAC: RRR, no murmurs, rubs, gallops RESPIRATORY:  Clear to auscultation without rales, wheezing or rhonchi  ABDOMEN: Soft, non-tender, distended MUSCULOSKELETAL: 1+ lower extremity edema, worse on right SKIN: Warm and dry NEUROLOGIC:  Alert and oriented x 3 PSYCHIATRIC:  Normal affect   ASSESSMENT:    1. Heart failure with preserved ejection fraction, unspecified HF chronicity ([place])   2. Hx of CABG   3. Pacemaker   4. PAF (paroxysmal atrial fibrillation) ([facility])    PLAN:    In order of problems listed above:  1. Patient with hx of HFpEF, G2DD, EF 50-55%, has lower extremity edema and also weight gain.  EF 50 to 55%. Still has edema on my exam.  Continue torsemide, component of venous insufficiency/varicose veins contributing to edema.  Leg raising while in seated position advised. 2. History of CAD/CABG.  Eliquis and Crestor 40 mg. 3. History of permanent pacemaker. Keep appointment with device clinic for checks.  Has appointment with EP next month 4. History of A. fib, EKG today shows paced rhythm.  Hold Toprol-XL as patient is fatigued, BP is low normal..  Continue Eliquis,  Follow up in 3 months  Total encounter 40 minutes  Greater than 50% was spent in counseling and coordination of care with the patient   This note was generated in part or whole with voice recognition software. Voice recognition is usually quite accurate but there are transcription errors that can and very often do occur. I apologize for any typographical errors that were not detected and corrected.  Medication Adjustments/Labs and Tests Ordered: Current medicines are reviewed  at length with the patient today.  Concerns regarding medicines are outlined above.  Orders Placed This Encounter  Procedures  . EKG 12-Lead   No orders of the defined types were placed in this encounter.   Patient Instructions  Medication Instructions:  Hold your Metoprolol (Toprol XL) for 2 weeks and then call us to let us know if your are going to restart it.  *If you need a refill on your cardiac medications before your next appointment, please call your pharmacy*   Lab Work: None Ordered If you have labs (blood work) drawn today and your tests are completely normal, you will receive your results only by: [name] MyChart Message (if you have MyChart) OR . A paper copy in the mail If you have any lab test that is abnormal or we need to change your treatment, we will call you to review the results.   Testing/Procedures: None Ordered   Follow-Up: At [facility], you and your health needs are our priority.  As part of our continuing mission to provide you with exceptional heart care, we have created designated Provider Care Teams.  These Care Teams include your primary Cardiologist (physician) and Advanced Practice Providers (APPs -  Physician Assistants and Nurse Practitioners) who all work together to provide you with the care you need, when you need it.  We recommend signing up for the patient  portal called "MyChart".  Sign up information is provided on this After Visit Summary.  MyChart is used to connect with patients for Virtual Visits (Telemedicine).  Patients are able to view lab/test results, encounter notes, upcoming appointments, etc.  Non-urgent messages can be sent to your provider as well.   To learn more about what you can do with MyChart, go to [email or web address].    Your next appointment:   3 month(s)  The format for your next appointment:   In Person  Provider:   Kate Sable, MD   Other Instructions      Signed, Kate Sable, MD   02/24/2020 4:50 PM    [place]

## 2020-02-24 NOTE — Patient Instructions (Signed)
Medication Instructions:  Hold your Metoprolol (Toprol XL) for 2 weeks and then call us to let us know if your are going to restart it.  *If you need a refill on your cardiac medications before your next appointment, please call your pharmacy*   Lab Work: None Ordered If you have labs (blood work) drawn today and your tests are completely normal, you will receive your results only by: [name] MyChart Message (if you have MyChart) OR . A paper copy in the mail If you have any lab test that is abnormal or we need to change your treatment, we will call you to review the results.   Testing/Procedures: None Ordered   Follow-Up: At [facility], you and your health needs are our priority.  As part of our continuing mission to provide you with exceptional heart care, we have created designated Provider Care Teams.  These Care Teams include your primary Cardiologist (physician) and Advanced Practice Providers (APPs -  Physician Assistants and Nurse Practitioners) who all work together to provide you with the care you need, when you need it.  We recommend signing up for the patient portal called "MyChart".  Sign up information is provided on this After Visit Summary.  MyChart is used to connect with patients for Virtual Visits (Telemedicine).  Patients are able to view lab/test results, encounter notes, upcoming appointments, etc.  Non-urgent messages can be sent to your provider as well.   To learn more about what you can do with MyChart, go to [email or web address].    Your next appointment:   3 month(s)  The format for your next appointment:   In Person  Provider:   Kate Sable, MD   Other Instructions

## 2020-03-05 ENCOUNTER — Ambulatory Visit (INDEPENDENT_AMBULATORY_CARE_PROVIDER_SITE_OTHER): Payer: Medicare Other

## 2020-03-05 DIAGNOSIS — I495 Sick sinus syndrome: Secondary | ICD-10-CM | POA: Diagnosis not present

## 2020-03-05 LAB — CUP PACEART REMOTE DEVICE CHECK
Battery Remaining Longevity: 159 mo
Battery Voltage: 3.17 V
Brady Statistic AP VP Percent: 0.07 %
Brady Statistic AP VS Percent: 98.36 %
Brady Statistic AS VP Percent: 0 %
Brady Statistic AS VS Percent: 1.57 %
Brady Statistic RA Percent Paced: 98.31 %
Brady Statistic RV Percent Paced: 0.08 %
Date Time Interrogation Session: 20211215185054
Implantable Lead Implant Date: 20080806
Implantable Lead Implant Date: 20080806
Implantable Lead Location: 753859
Implantable Lead Location: 753860
Implantable Pulse Generator Implant Date: 20210617
Lead Channel Impedance Value: 418 Ohm
Lead Channel Impedance Value: 456 Ohm
Lead Channel Impedance Value: 551 Ohm
Lead Channel Impedance Value: 589 Ohm
Lead Channel Pacing Threshold Amplitude: 0.625 V
Lead Channel Pacing Threshold Amplitude: 0.75 V
Lead Channel Pacing Threshold Pulse Width: 0.4 ms
Lead Channel Pacing Threshold Pulse Width: 0.4 ms
Lead Channel Sensing Intrinsic Amplitude: 12.75 mV
Lead Channel Sensing Intrinsic Amplitude: 12.75 mV
Lead Channel Sensing Intrinsic Amplitude: 3.75 mV
Lead Channel Sensing Intrinsic Amplitude: 3.75 mV
Lead Channel Setting Pacing Amplitude: 1.5 V
Lead Channel Setting Pacing Amplitude: 2.5 V
Lead Channel Setting Pacing Pulse Width: 0.4 ms
Lead Channel Setting Sensing Sensitivity: 4 mV

## 2020-03-06 ENCOUNTER — Encounter: Payer: Self-pay | Admitting: Obstetrics and Gynecology

## 2020-03-06 ENCOUNTER — Ambulatory Visit (INDEPENDENT_AMBULATORY_CARE_PROVIDER_SITE_OTHER): Payer: Medicare Other | Admitting: Obstetrics and Gynecology

## 2020-03-06 ENCOUNTER — Other Ambulatory Visit (HOSPITAL_COMMUNITY)
Admission: RE | Admit: 2020-03-06 | Discharge: 2020-03-06 | Disposition: A | Discharge status: Home / Self Care | Payer: Medicare Other | Source: Ambulatory Visit | Attending: Obstetrics and Gynecology | Admitting: Obstetrics and Gynecology

## 2020-03-06 ENCOUNTER — Other Ambulatory Visit: Payer: Self-pay

## 2020-03-06 VITALS — BP 100/66 | Ht 66.0 in | Wt 192.0 lb

## 2020-03-06 DIAGNOSIS — Z01419 Encounter for gynecological examination (general) (routine) without abnormal findings: Secondary | ICD-10-CM | POA: Diagnosis not present

## 2020-03-06 DIAGNOSIS — Z124 Encounter for screening for malignant neoplasm of cervix: Secondary | ICD-10-CM | POA: Insufficient documentation

## 2020-03-06 DIAGNOSIS — Z0000 Encounter for general adult medical examination without abnormal findings: Secondary | ICD-10-CM | POA: Diagnosis not present

## 2020-03-06 DIAGNOSIS — Z1239 Encounter for other screening for malignant neoplasm of breast: Secondary | ICD-10-CM

## 2020-03-06 NOTE — Progress Notes (Signed)
Gynecology Annual Exam  PCP: Martin Majestic, FNP  Chief Complaint:  Chief Complaint  Patient presents with  . Gynecologic Exam    Annual - no concerns. RM 5    History of Present Illness:Patient is a 79 y.o. No obstetric history on file. presents for annual exam. The patient has no complaints today.   LMP: No LMP recorded. Patient has had a hysterectomy. No postmenopausal bleeding.  The patient has had continued rectal bleeding being followed by GI  There is no notable family history of breast or ovarian cancer in her family.  The patient wears seatbelts: yes.   The patient has regular exercise: not asked.    The patient denies current symptoms of depression.     Review of Systems: Review of Systems  Constitutional: Negative.  Negative for chills and fever.  HENT: Negative for congestion.   Respiratory: Negative for cough and shortness of breath.   Cardiovascular: Negative for chest pain and palpitations.  Gastrointestinal: Negative.  Negative for abdominal pain, constipation, diarrhea, heartburn, nausea and vomiting.  Genitourinary: Negative for dysuria, frequency and urgency.  Skin: Negative for itching and rash.  Neurological: Negative for dizziness and headaches.  Endo/Heme/Allergies: Negative for polydipsia. Bruises/bleeds easily.  Psychiatric/Behavioral: Negative for depression.    Past Medical History:  Patient Active Problem List   Diagnosis Date Noted  . GI bleed 01/28/2020  . Rectal bleed 01/28/2020  . At risk for polypharmacy 01/28/2020  . Acute delirium   . HCAP (healthcare-associated pneumonia)   . Persistent atrial fibrillation ([place])   . Hypotension   . Hyponatremia   . Lobar pneumonia ([place]) 10/23/2019  . Atrial flutter ([place])   . Dizziness   . Atrial fibrillation status post cardioversion ([place]) 09/27/2019  . CHF (congestive heart failure) ([place]) 09/26/2019  . Acute on chronic diastolic heart failure ([place]) 09/25/2019  . Hypertensive  urgency 09/25/2019  . Fluid overload 09/25/2019  . Excessive gas 05/14/2019  . Generalized bloating 05/14/2019  . Bright red blood per rectum 05/14/2019  . 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 Chest pain 11/20/2018  . Difficulty walking 11/19/2018  . Insomnia 11/15/2018  . Type 2 diabetes mellitus with hyperlipidemia ([place]) 11/15/2018  . Goals of care, counseling/discussion 02/06/2018  . 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 Rectal mass 01/05/2018  . Leg pain, bilateral 12/06/2017  . Lymphedema 07/10/2017  . Atherosclerosis of native arteries of the extremities with ulceration ([place]) 07/10/2017  . Stricture and stenosis of esophagus   . Dysphagia   . Syncope and collapse 04/11/2017  . Mild dementia ([place]) 01/24/2017  . Lumbar facet osteoarthritis (Bilateral) 01/17/2017  . Acute postoperative pain 01/17/2017  . Hypokalemia 12/24/2016  . Anxiety 12/07/2016  . 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 Cerebrovascular accident ([place]) 12/07/2016  . Chronic depression 12/07/2016  . Essential hypertension 12/07/2016  . Hypothyroidism 12/07/2016  . Mixed hyperlipidemia 12/07/2016  . Myocardial infarction ([place]) 12/07/2016  . TIA (transient ischemic attack) 11/17/2016  . Parkinson disease ([place]) 10/27/2016  . Tremor 10/27/2016  . Chronic knee pain (B) (R>L) 10/19/2016  . Ischemic chest pain ([place]) 09/29/2016  . Abnormal CT scan, lumbar spine 09/08/2016    L5-S1: Mild disc bulge. No significant central canal or neural foramen narrowing. L4-5: Diffuse broad-based disc bulge and prominent degenerative facet hypertrophy combining to cause a severe central canal stenosis. Also prominent bilateral lateral recess narrowings with possible associated nerve root impingement. Mild bilateral neural foramen  stenoses. L3-4: Diffuse disc bulge degenerative and degenerative facet hypertrophy combining to cause a moderate to severe central canal stenosis  and moderate to severe bilateral neural foramen stenoses with possible associated nerve root impingement. L2-3: Mild disc bulge. Prominent degenerative facet hypertrophy, right greater than left, causing moderate central canal stenosis, severe right neural foramen stenosis and at least some degree of right lateral recess narrowing, with almost certain right-sided nerve root impingement. L1-2: Disc space is relatively well preserved. No significant central canal or neural foramen stenosis. IMPRESSION: 1. Degenerative changes, as detailed above. These degenerative changes result in severe central canal stenosis at L4-5, moderate to severe central canal stenosis at L3-4, moderate central canal stenosis at L2-3, and moderate to severe neural foramen stenoses and lateral recess stenoses at multiple levels (including severe right neural foramen stenosis and lateral recess stenosis at L2-3) with probable associated nerve root impingements at multiple levels.  2. 35 degrees levoscoliosis centered at the L3 vertebral body level. 3. 7 mm anterior displacement of the L4 vertebral body, almost certainly related to the underlying degenerative change and scoliosis.   . Lumbar foraminal stenosis (multilevel) 09/08/2016  . Fracture of clavicle 08/03/2016  . Polyneuropathy 05/17/2016  . Neurogenic pain 04/12/2016  . Chronic lower extremity pain (Bilateral) (R>L) 04/12/2016  . Chronic lower extremity radicular pain (Primary Source of Pain) (Bilateral) (R>L) 04/12/2016  . Lower extremity weakness 04/12/2016  . Chronic low back pain (Secondary source of pain) (Bilateral) (R>L) 04/12/2016  . Chronic wrist pain (Left) (secondary to fracture) 04/12/2016  . Lumbar spondylosis 04/12/2016  . Chronic pain syndrome 04/11/2016  . Long term current use of opiate analgesic 04/11/2016  . Long term prescription opiate use 04/11/2016  . Opiate use 04/11/2016  . Long term prescription benzodiazepine use 04/11/2016  . Lumbar  radiculopathy (Multilevel) (Bilateral) 05/07/2015  . Lumbar facet syndrome (Bilateral) (R>L) 08/21/2014  . 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 Sacroiliac joint dysfunction (Bilateral) 08/13/2014  . Frequent falls 06/04/2014  . Memory loss 06/04/2014  . Anxiety and depression 08/22/2013  . CAD (coronary artery disease) 08/22/2013  . Therapeutic opioid-induced constipation (OIC) 08/22/2013  . GERD (gastroesophageal reflux disease) 08/22/2013    Past Surgical History:  Past Surgical History:  Procedure Laterality Date  . ABDOMINAL HYSTERECTOMY     partial  . BREAST SURGERY    . CARDIOVERSION N/A 10/24/2019   Procedure: CARDIOVERSION;  Surgeon: Wellington Hampshire, MD;  Location: [facility] ORS;  Service: Cardiovascular;  Laterality: N/A;  . COLONOSCOPY    . COLONOSCOPY N/A 01/30/2020   Procedure: COLONOSCOPY;  Surgeon: Toledo, Benay Pike, MD;  Location: [facility] ENDOSCOPY;  Service: Gastroenterology;  Laterality: N/A;  . COLONOSCOPY WITH PROPOFOL N/A 11/29/2017   Procedure: COLONOSCOPY WITH PROPOFOL;  Surgeon: [name], MD;  Location[facility] ENDOSCOPY;  Service: Endoscopy;  Laterality: N/A;  . CORONARY ANGIOPLASTY     stent  . CORONARY ARTERY BYPASS GRAFT    . CORONARY STENT PLACEMENT    . ESOPHAGOGASTRODUODENOSCOPY (EGD) WITH PROPOFOL N/A 04/18/2017   Procedure: ESOPHAGOGASTRODUODENOSCOPY (EGD) WITH PROPOFOL;  Surgeon: [name], MD;  Location: [facility] ENDOSCOPY;  Service: Endoscopy;  Laterality: N/A;  . ESOPHAGOGASTRODUODENOSCOPY (EGD) WITH PROPOFOL N/A 11/29/2017   Procedure: ESOPHAGOGASTRODUODENOSCOPY (EGD) WITH PROPOFOL;  Surgeon: [name], MD;  Location: [facility] ENDOSCOPY;  Service: Endoscopy;  Laterality: N/A;  . INSERT / REPLACE / REMOVE PACEMAKER    . PPM GENERATOR CHANGEOUT N/A 09/05/2019   Procedure: PPM GENERATOR CHANGEOUT;  Surgeon: Caryl Comes,  Revonda Standard, MD;  Location: [place] [facility];  Service: Cardiovascular;  Laterality: N/A;  . s/p pacer  insertion    . TRANSANAL EXCISION OF RECTAL MASS N/A 01/17/2018   Procedure: TRANSANAL EXCISION OF RECTAL POLYP;  Surgeon: [name], MD;  Location: [facility] ORS;  Service: General;  Laterality: N/A;    Gynecologic History:  No LMP recorded. Patient has had a hysterectomy. Last Pap: Results were: 03/02/2018 no abnormalities   Obstetric History: No obstetric history on file.  Family History:  Family History  Problem Relation Age of Onset  . Cancer Mother   . Depression Mother   . Diabetes Mother   . Hypertension Mother   . Aneurysm Mother   . Heart disease Father   . Diabetes Sister   . Diabetes Brother   . Diabetes Brother     Social History:  Social History   Socioeconomic History  . Marital status: Widowed    Spouse name: Not on file  . Number of children: Not on file  . Years of education: Not on file  . Highest education level: Not on file  Occupational History  . Occupation: retired  Tobacco Use  . Smoking status: Never Smoker  . Smokeless tobacco: Never Used  Vaping Use  . Vaping Use: Never used  Substance and Sexual Activity  . Alcohol use: No  . Drug use: No  . Sexual activity: Never  Other Topics Concern  . Not on file  Social History Narrative  . Not on file   Social Determinants of Health   Financial Resource Strain: Not on file  Food Insecurity: Not on file  Transportation Needs: Not on file  Physical Activity: Not on file  Stress: Not on file  Social Connections: Not on file  Intimate Partner Violence: Not on file    Allergies:  Allergies  Allergen Reactions  . Penicillins Anaphylaxis, Swelling and Rash    TOLERATES CEFTRIAXONE    Medications: Prior to Admission medications   Medication Sig Start Date End Date Taking? Authorizing Provider  apixaban (ELIQUIS) 5 MG TABS tablet Take 1 tablet (5 mg total) by mouth 2 (two) times daily. 09/30/19  Yes Jennye Boroughs, MD  cholecalciferol (VITAMIN D3) 25 MCG (1000 UNIT) tablet Take 1,000  Units by mouth daily.   Yes [provider]  cholestyramine (QUESTRAN) 4 g packet Take 1 packet (4 g total) by mouth daily. in water 10/28/19  Yes Wieting, Richard, MD  donepezil (ARICEPT) 10 MG tablet Take 10 mg by mouth daily.    Yes [provider]  Ferrous Sulfate (SLOW FE) 142 (45 Fe) MG TBCR Take 1 tablet by mouth twice daily 11/19/19  Yes [name], MD  gabapentin (NEURONTIN) 300 MG capsule Take 1-2 capsules (300-600 mg total) by mouth 3 (three) times daily. Take 300 mg in the morning, 300 mg in the afternoon and 600 mg at night 01/30/20  Yes Jennye Boroughs, MD  hydrocortisone (ANUSOL-HC) 25 MG suppository Place 1 suppository (25 mg total) rectally 2 (two) times daily. Okay to substitute generic 10/28/19  Yes Wieting, Richard, MD  insulin detemir (LEVEMIR) 100 UNIT/ML FlexPen Inject 5 Units into the skin every evening. 10/28/19  Yes Wieting, Richard, MD  Insulin Pen Needle 31G X 8 MM MISC 1 Dose by Does not apply route at bedtime. 10/28/19  Yes Wieting, Richard, MD  levothyroxine (SYNTHROID) 125 MCG tablet Take 125 mcg by mouth daily.  12/25/19  Yes [provider]  LINZESS 72 MCG  capsule Take 72 mcg by mouth every morning. 12/04/19  Yes [provider]  liver oil-zinc oxide (DESITIN) 40 % ointment Apply topically 2 (two) times daily. Apply to buttock and groin ares 10/28/19  Yes Wieting, Richard, MD  loratadine (CLARITIN) 10 MG tablet Take 10 mg by mouth daily.    Yes [provider]  meclizine (ANTIVERT) 12.5 MG tablet Take 1 tablet (12.5 mg total) by mouth 2 (two) times daily as needed for dizziness. 09/30/19  Yes Jennye Boroughs, MD  metoprolol succinate (TOPROL-XL) 25 MG 24 hr tablet Take 0.5 tablets (12.5 mg total) by mouth at bedtime. 10/28/19  Yes Wieting, Richard, MD  montelukast (SINGULAIR) 10 MG tablet Take 10 mg by mouth at bedtime.  07/23/19  Yes [provider]  Multiple Vitamin (MULTIVITAMIN WITH MINERALS) TABS tablet Take 1 tablet by  mouth daily.   Yes [provider]  nitroGLYCERIN (NITROSTAT) 0.4 MG SL tablet Place 0.4 mg under the tongue every 5 (five) minutes as needed for chest pain.    Yes [provider]  OLANZapine (ZYPREXA) 2.5 MG tablet Take 2.5 mg by mouth in the morning and at bedtime.    Yes [provider]  omeprazole (PRILOSEC) 40 MG capsule Take 40 mg by mouth 2 (two) times daily. 10/22/18  Yes [provider]  pioglitazone (ACTOS) 30 MG tablet Take 30 mg by mouth daily. 12/25/19  Yes [provider]  potassium chloride SA (KLOR-CON) 20 MEQ tablet Take 2 tablets (40 mEq total) by mouth daily. 10/29/19  Yes Wieting, Richard, MD  rosuvastatin (CRESTOR) 40 MG tablet Take 40 mg by mouth daily. 10/22/18  Yes [provider]  torsemide (DEMADEX) 20 MG tablet Take 2 tablets (40 mg) once daily in the AM & take 2 tablets (40 mg) on Mondays, Wednesdays, & Fridays at lunch time 01/21/20  Yes [name], MD  traMADol (ULTRAM) 50 MG tablet Take 1 tablet (50 mg total) by mouth every 8 (eight) hours as needed for moderate pain. 12/26/19  Yes Johnn Hai, PA-C  traZODone (DESYREL) 100 MG tablet Take 1 tablet (100 mg total) by mouth at bedtime. 10/28/19  Yes Wieting, Richard, MD  vitamin B-12 (CYANOCOBALAMIN) 1000 MCG tablet Take 1,000 mcg by mouth daily.   Yes [provider]  vitamin E 180 MG (400 UNITS) capsule Take 400 Units by mouth daily.   Yes [provider]  albuterol (VENTOLIN HFA) 108 (90 Base) MCG/ACT inhaler Inhale 2 puffs into the lungs every 6 (six) hours as needed for wheezing or shortness of breath. Patient not taking: Reported on 03/06/2020 10/28/19   Loletha Grayer, MD  hydrOXYzine (ATARAX/VISTARIL) 25 MG tablet Take 25 mg by mouth 2 (two) times daily. 01/27/20   [provider]  sertraline (ZOLOFT) 100 MG tablet Take 100 mg by mouth 2 (two) times daily. 11/19/18 12/13/19  [provider]  silver sulfADIAZINE (SILVADENE) 1 %  cream Apply 1 application topically 2 (two) times daily. Apply to left heel wound  (please help her with dressing supplies to purchase.  I was thinking a 4x4 on the wound and then she could use a piece of a Kerlex to hold it in place so one Kerlex would last for multiple dressings) Patient not taking: Reported on 03/06/2020 11/18/19   Schnier, Dolores Lory, MD    Physical Exam Vitals: Blood pressure 100/66, height 5\' 6"  (1.676 m), weight 192 lb (87.1 kg).  General: NAD HEENT: normocephalic, anicteric Pulmonary: No increased work of  breathing, CTAB Cardiovascular: RRR, distal pulses 2+ Breast: Breast symmetrical, no tenderness, no palpable nodules or masses, no skin or nipple retraction present, no nipple discharge.  No axillary or supraclavicular lymphadenopathy. Abdomen: soft, non-tender, non-distended.  Umbilicus without lesions.  No hepatomegaly, splenomegaly or masses palpable. No evidence of hernia  Genitourinary:  External: Normal external female genitalia.  Normal urethral meatus, normal Bartholin's and Skene's glands.    Vagina: Normal vaginal mucosa, no evidence of prolapse.    Cervix: surgically absent  Uterus: surgically absent  Adnexa: ovaries non-enlarged, no adnexal masses  Rectal: deferred  Lymphatic: no evidence of inguinal lymphadenopathy Extremities: no edema, erythema, or tenderness Neurologic: Grossly intact Psychiatric: mood appropriate, affect full  Female chaperone present for pelvic and breast  portions of the physical exam     Assessment: 79 y.o. No obstetric history on file. routine annual exam  Plan: Problem List Items Addressed This Visit   None   Visit Diagnoses    Screening for malignant neoplasm of cervix    -  Primary   Breast screening       Encounter for gynecological examination without abnormal finding          1) ASCCP guidelines and rational discussed.  We discussed recommendation to discontinue age >50 and in setting of prior  hysterectomy.  Patient would like to continue given her prior rectal cancer diagnosis.  Discussed Medicare will allow for paps every 2 years.  2) Routine healthcare maintenance including cholesterol, diabetes screening discussed managed by PCP  3) Return in about 2 years (around 03/06/2022) for Annual.    Malachy Mood, MD Mosetta Pigeon, [facility] 03/06/2020, 3:01 PM

## 2020-03-11 LAB — CYTOLOGY - PAP: Diagnosis: NEGATIVE

## 2020-03-16 ENCOUNTER — Ambulatory Visit: Payer: Medicare Other | Admitting: Podiatry

## 2020-03-17 ENCOUNTER — Encounter: Payer: Self-pay | Admitting: Internal Medicine

## 2020-03-18 ENCOUNTER — Ambulatory Visit: Payer: Medicare Other | Admitting: Podiatry

## 2020-03-18 ENCOUNTER — Telehealth: Payer: Self-pay | Admitting: Internal Medicine

## 2020-03-18 NOTE — Telephone Encounter (Signed)
Spoke with patient and she wanted to Clarify how she is to take her Torsemide. She confirmed that she is taking it as prescribed by Dr. Graciela Husbands, torsemide (DEMADEX) 20 MG tablet Take 2 tablets (40 mg) once daily in the AM & take 2 tablets (40 mg) on Mondays, Wednesdays, & Fridays at lunch time. Patient also stated that she did restart her Toprol XL. At her OV with Dr. Azucena Cecil on 02/24/20 she was instructed to hold for 2 weeks.   Patient appreciated the call back.

## 2020-03-18 NOTE — Telephone Encounter (Signed)
Please call to discuss how patient should take her fluid pill

## 2020-03-19 ENCOUNTER — Ambulatory Visit: Payer: Medicare Other | Admitting: Podiatry

## 2020-03-19 NOTE — Progress Notes (Signed)
Remote pacemaker transmission.   

## 2020-04-03 ENCOUNTER — Other Ambulatory Visit: Payer: Self-pay

## 2020-04-03 ENCOUNTER — Emergency Department
Admission: EM | Admit: 2020-04-03 | Discharge: 2020-04-03 | Disposition: A | Discharge status: Home / Self Care | Payer: Medicare Other | Attending: Emergency Medicine | Admitting: Emergency Medicine

## 2020-04-03 DIAGNOSIS — Z8542 Personal history of malignant neoplasm of other parts of uterus: Secondary | ICD-10-CM | POA: Diagnosis not present

## 2020-04-03 DIAGNOSIS — E119 Type 2 diabetes mellitus without complications: Secondary | ICD-10-CM | POA: Insufficient documentation

## 2020-04-03 DIAGNOSIS — Z951 Presence of aortocoronary bypass graft: Secondary | ICD-10-CM | POA: Insufficient documentation

## 2020-04-03 DIAGNOSIS — Z23 Encounter for immunization: Secondary | ICD-10-CM | POA: Diagnosis not present

## 2020-04-03 DIAGNOSIS — Z794 Long term (current) use of insulin: Secondary | ICD-10-CM | POA: Diagnosis not present

## 2020-04-03 DIAGNOSIS — Z79899 Other long term (current) drug therapy: Secondary | ICD-10-CM | POA: Diagnosis not present

## 2020-04-03 DIAGNOSIS — I5033 Acute on chronic diastolic (congestive) heart failure: Secondary | ICD-10-CM | POA: Diagnosis not present

## 2020-04-03 DIAGNOSIS — I2510 Atherosclerotic heart disease of native coronary artery without angina pectoris: Secondary | ICD-10-CM | POA: Diagnosis not present

## 2020-04-03 DIAGNOSIS — Z85048 Personal history of other malignant neoplasm of rectum, rectosigmoid junction, and anus: Secondary | ICD-10-CM | POA: Insufficient documentation

## 2020-04-03 DIAGNOSIS — F0390 Unspecified dementia without behavioral disturbance: Secondary | ICD-10-CM | POA: Insufficient documentation

## 2020-04-03 DIAGNOSIS — Z7901 Long term (current) use of anticoagulants: Secondary | ICD-10-CM | POA: Insufficient documentation

## 2020-04-03 DIAGNOSIS — Z950 Presence of cardiac pacemaker: Secondary | ICD-10-CM | POA: Insufficient documentation

## 2020-04-03 DIAGNOSIS — E039 Hypothyroidism, unspecified: Secondary | ICD-10-CM | POA: Diagnosis not present

## 2020-04-03 DIAGNOSIS — I110 Hypertensive heart disease with heart failure: Secondary | ICD-10-CM | POA: Insufficient documentation

## 2020-04-03 DIAGNOSIS — Y9389 Activity, other specified: Secondary | ICD-10-CM | POA: Diagnosis not present

## 2020-04-03 DIAGNOSIS — W268XXA Contact with other sharp object(s), not elsewhere classified, initial encounter: Secondary | ICD-10-CM | POA: Diagnosis not present

## 2020-04-03 DIAGNOSIS — S81812A Laceration without foreign body, left lower leg, initial encounter: Secondary | ICD-10-CM | POA: Diagnosis not present

## 2020-04-03 DIAGNOSIS — S8992XA Unspecified injury of left lower leg, initial encounter: Secondary | ICD-10-CM | POA: Diagnosis present

## 2020-04-03 MED ORDER — TETANUS-DIPHTH-ACELL PERTUSSIS 5-2.5-18.5 LF-MCG/0.5 IM SUSY
0.5000 mL | PREFILLED_SYRINGE | Freq: Once | INTRAMUSCULAR | Status: AC
  Administered 2020-04-03: 0.5 mL via INTRAMUSCULAR
  Filled 2020-04-03: qty 0.5

## 2020-04-03 NOTE — Discharge Instructions (Addendum)
Follow discharge care instruction.  Do not apply any ointments or cream to Dermabond area.  Remove dressing and 24 hours.  May shower, dry the area, and apply dry sterile dressing i.e. Band-Aid to area.  Return to ED if wound reopens before healing is complete.
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
## 2020-04-13 ENCOUNTER — Ambulatory Visit (INDEPENDENT_AMBULATORY_CARE_PROVIDER_SITE_OTHER): Payer: Medicare Other | Admitting: Vascular Surgery

## 2020-04-13 ENCOUNTER — Encounter (INDEPENDENT_AMBULATORY_CARE_PROVIDER_SITE_OTHER): Payer: Self-pay

## 2020-04-17 ENCOUNTER — Telehealth: Payer: Self-pay | Admitting: Cardiology

## 2020-04-17 NOTE — Telephone Encounter (Signed)
Spoke with patient and she stated that she gained 5 lbs 6 oz overnight and is feeling slightly SOB. We reviewed how she is taking her Torsemide and she has not been taking it as prescribed.   We reviewed the instructions I gave her on 03/18/20 as follows:  03/18/20 4:14 PM Spoke with patient and she wanted to Clarify how she is to take her Torsemide. She confirmed that she is taking it as prescribed by Dr. Caryl Comes, torsemide (DEMADEX) 20 MG tablet Take 2 tablets (40 mg) once daily in the AM & take 2 tablets (40 mg) on Mondays, Wednesdays, & Fridays at lunch time. Patient also stated that she did restart her Toprol XL. At her OV with Dr. Garen Lah on 02/24/20 she was instructed to hold for 2 weeks.   Patient appreciated the call back.     Patient has been taking Torsemide 20 MG, only 1 tablet every morning and an additional 2 tablets in the afternoon on M/W/F. I instructed her to take an additional tablet when we get off the phone as she only took one this morning and then her 2 additional tablets this afternoon. She agreed she would start taking 2 tablets every morning.  I offered patient the DOD slot this afternoon, however she stated that she has no way of getting here and will not be able to come in until next Friday on 04/24/20.  Will route to Dr. Garen Lah for any further recommendation.

## 2020-04-17 NOTE — Telephone Encounter (Signed)
Pt c/o swelling: STAT is pt has developed SOB within 24 hours  1) How much weight have you gained and in what time span? 5 lbs 6 oz overnight   2) If swelling, where is the swelling located? Abdomen and BLE   3) Are you currently taking a fluid pill? Yes   4) Are you currently SOB? yes  5) Do you have a log of your daily weights (if so, list)? Yes previous weight running 186-189 today 195.6   6) Have you gained 3 pounds in a day or 5 pounds in a week? yes  7) Have you traveled recently? No    Patient declined same day or sooner than now scheduled 04-24-20  with Agbor Etang at 19, as she already has other appts and has to arrange transport.

## 2020-04-17 NOTE — Telephone Encounter (Signed)
Called patient back and left a VM to return my call.

## 2020-04-21 ENCOUNTER — Other Ambulatory Visit: Payer: Self-pay

## 2020-04-21 ENCOUNTER — Telehealth: Payer: Self-pay | Admitting: Podiatry

## 2020-04-21 ENCOUNTER — Ambulatory Visit (INDEPENDENT_AMBULATORY_CARE_PROVIDER_SITE_OTHER): Payer: Medicare Other | Admitting: Podiatry

## 2020-04-21 DIAGNOSIS — M79675 Pain in left toe(s): Secondary | ICD-10-CM | POA: Diagnosis not present

## 2020-04-21 DIAGNOSIS — M79674 Pain in right toe(s): Secondary | ICD-10-CM

## 2020-04-21 DIAGNOSIS — M722 Plantar fascial fibromatosis: Secondary | ICD-10-CM

## 2020-04-21 DIAGNOSIS — B351 Tinea unguium: Secondary | ICD-10-CM | POA: Diagnosis not present

## 2020-04-21 DIAGNOSIS — L989 Disorder of the skin and subcutaneous tissue, unspecified: Secondary | ICD-10-CM | POA: Diagnosis not present

## 2020-04-21 MED ORDER — BETAMETHASONE SOD PHOS & ACET 6 (3-3) MG/ML IJ SUSP
3.0000 mg | Freq: Once | INTRAMUSCULAR | Status: AC
  Administered 2020-04-21: 3 mg via INTRA_ARTICULAR

## 2020-04-21 NOTE — Progress Notes (Signed)
SUBJECTIVE Patient presents to office today complaining of elongated, thickened nails that cause pain while ambulating in shoes.  She is unable to trim her own nails.  Patient also states that she has a painful symptomatic callus to the distal tip of the right third toe.  She would like to have it evaluated.   Patient also has a new complaint today regarding bilateral heel pain.  She states that her heels have been very painful over the last few months.  She denies a history of injury.  Aggravated by walking.  She states that she also goes barefoot throughout the house which exacerbates her pain.  Past Medical History:  Diagnosis Date  . Anxiety   . Arthritis   . Broken arm    left FA 3/17  . Broken arm    left  . CAD (coronary artery disease)    s/p MI  . Cancer ([place])    uterine  . Cervical disc disorder   . Depression   . Diabetes mellitus without complication ([place])   . GERD (gastroesophageal reflux disease)   . Headache   . Hyperlipidemia   . Hypertension   . Hypothyroid   . Myocardial infarction ([place])    15 year ago  . Neck pain 06/04/2014  . Parkinson's disease ([place])   . Presence of permanent cardiac pacemaker   . Spinal stenosis     OBJECTIVE General Patient is awake, alert, and oriented x 3 and in no acute distress. Derm Skin is dry and supple bilateral. Negative open lesions or macerations. Remaining integument unremarkable. Nails are tender, long, thickened and dystrophic with subungual debris, consistent with onychomycosis, 1-5 bilateral. No signs of infection noted.  Hyperkeratotic callus tissue noted to the left plantar heel as well as the distal tip of the right third toe with a central nucleated core.  Tenderness to palpation Vasc  DP and PT pedal pulses palpable bilaterally. Temperature gradient within normal limits.  Neuro Epicritic and protective threshold sensation grossly intact bilaterally.  Musculoskeletal Exam No symptomatic pedal deformities noted  bilateral. Muscular strength within normal limits.  Pain on palpation along the plantar heels at the insertion of the plantar fascia bilateral  ASSESSMENT 1. Onychodystrophic nails 1-5 bilateral with hyperkeratosis of nails.  2. Onychomycosis of nail due to dermatophyte bilateral 3. Pain in foot bilateral 4.  Preulcerative callus/porokeratosis distal tip right third toe 5.  Plantar fasciitis bilateral  PLAN OF CARE 1. Patient evaluated today.  2. Instructed to maintain good pedal hygiene and foot care.  3. Mechanical debridement of nails 1-5 bilaterally performed using a nail nipper. Filed with dremel without incident.  4.  Excisional debridement of the hyperkeratotic preulcerative callus lesions was performed using a chisel blade and tissue nipper without incident or bleeding  5.  OTC therapeutic lotion provided today.  Apply daily  6.  Injection of 0.5 cc Celestone Soluspan injected into the plantar fascia bilateral heels  7.  Return to clinic in 3 mos.    Edrick Kins, DPM [facility]  Dr. Edrick Kins, DPM    [address]                                        [place], [place] [address]                Office [phone]  Fax [phone]  [phone]     

## 2020-04-21 NOTE — Telephone Encounter (Signed)
pts daughter called asking about shoes for the pt that was seen today. She said the mom thought you recommended sketchers. She also said her mom is diabetic.   I advised her that we generally do not recommend sketchers but new balance or brooks.I told pts daughter to let me check with you to see if pt would qualify for diabetic shoes and inserts and I would call her back to discuss further if she did.

## 2020-04-24 ENCOUNTER — Ambulatory Visit (INDEPENDENT_AMBULATORY_CARE_PROVIDER_SITE_OTHER): Payer: Medicare Other | Admitting: Cardiology

## 2020-04-24 ENCOUNTER — Encounter: Payer: Self-pay | Admitting: Cardiology

## 2020-04-24 ENCOUNTER — Other Ambulatory Visit: Payer: Self-pay

## 2020-04-24 VITALS — BP 120/70 | HR 85 | Ht 66.0 in | Wt 198.0 lb

## 2020-04-24 DIAGNOSIS — I5030 Unspecified diastolic (congestive) heart failure: Secondary | ICD-10-CM | POA: Diagnosis not present

## 2020-04-24 DIAGNOSIS — I480 Paroxysmal atrial fibrillation: Secondary | ICD-10-CM | POA: Diagnosis not present

## 2020-04-24 DIAGNOSIS — Z951 Presence of aortocoronary bypass graft: Secondary | ICD-10-CM

## 2020-04-24 DIAGNOSIS — Z950 Presence of cardiac pacemaker: Secondary | ICD-10-CM | POA: Diagnosis not present

## 2020-04-24 MED ORDER — TORSEMIDE 20 MG PO TABS
40.0000 mg | ORAL_TABLET | Freq: Two times a day (BID) | ORAL | Status: DC
Start: 2020-04-24 — End: 2022-08-08

## 2020-04-24 NOTE — Patient Instructions (Signed)
Medication Instructions:   Your physician has recommended you make the following change in your medication:   1.  INCREASE your Torsemide to 40 MG twice daily.  *If you need a refill on your cardiac medications before your next appointment, please call your pharmacy*   Lab Work:  Your physician recommends that you return for lab work in: 1 week.  - Please go to the [facility]. You will check in at the front desk to the right as you walk into the atrium. Valet Parking is offered if needed. - No appointment needed. You may go any day between 7 am and 6 pm.     Testing/Procedures: None ordered   Follow-Up: At [facility], you and your health needs are our priority.  As part of our continuing mission to provide you with exceptional heart care, we have created designated Provider Care Teams.  These Care Teams include your primary Cardiologist (physician) and Advanced Practice Providers (APPs -  Physician Assistants and Nurse Practitioners) who all work together to provide you with the care you need, when you need it.  We recommend signing up for the patient portal called "MyChart".  Sign up information is provided on this After Visit Summary.  MyChart is used to connect with patients for Virtual Visits (Telemedicine).  Patients are able to view lab/test results, encounter notes, upcoming appointments, etc.  Non-urgent messages can be sent to your provider as well.   To learn more about what you can do with MyChart, go to [email or web address].    Your next appointment:   2 week(s)  The format for your next appointment:   In Person  Provider:   Kate Sable, MD   Other Instructions

## 2020-04-24 NOTE — Progress Notes (Signed)
Cardiology Office Note:    Date:  04/24/2020   ID:  Kristi [name], DOB 03-05-1941, MRN [id number]  PCP:  Martin Majestic, FNP  Cardiologist:  Kate Sable, MD  Electrophysiologist:  None   Referring MD: Martin Majestic, *   Chief Complaint  Patient presents with  . office visit-SOB and swelling    Patient also reports weight gain and fatigue. Patient states she is not sure of what medications she is taking or what they are for.    History of Present Illness:    Kristi [name] is a 80 y.o. female with a hx of diabetes, HFpEF, parox A. fib on Eliquis hypertension, hyperlipidemia, CAD, prior PCI x5 stents,CABGx3 over 15 years ago, permanent pacemaker, lymphedema who presents for follow-up.    Being seen for A. fib, CAD, heart failure preserved ejection fraction.  She complains of lower extremity edema, abdominal bloating, weight gain.  Currently takes torsemide 40 mg in the a.m. daily, 40 mg in the p.m. Monday Wednesday Fridays.  Prior notes  She sleeps on a hospital bed at home and has required to place it on an incline.   Patient has a history of bilateral chronic venous insufficiency in the deep vein system of the lower extremity, and lymphedema, being seen at the vein clinic.  Has compression stockings and lymphedema pump device prescribed by specialist.    Echocardiogram on 11/2018 showed low normal ejection fraction with EF 50 to 55%, pseudonormal diastolic function.  Stress Myoview in 10/07/2016 was normal. Pacemaker had inverted atrial and ventricular leads status post revision on 6/21.  A. fib during hospital admission 10/2019.  Status post DC cardioversion 10/24/2019.  Amiodarone stopped due to GI issues.  Past Medical History:  Diagnosis Date  . Anxiety   . Arthritis   . Broken arm    left FA 3/17  . Broken arm    left  . CAD (coronary artery disease)    s/p MI  . Cancer ([place])    uterine  . Cervical disc disorder   . Depression   . Diabetes mellitus  without complication ([place])   . GERD (gastroesophageal reflux disease)   . Headache   . Hyperlipidemia   . Hypertension   . Hypothyroid   . Myocardial infarction ([place])    15 year ago  . Neck pain 06/04/2014  . Parkinson's disease ([place])   . Presence of permanent cardiac pacemaker   . Spinal stenosis     Past Surgical History:  Procedure Laterality Date  . ABDOMINAL HYSTERECTOMY     partial  . BREAST SURGERY    . CARDIOVERSION N/A 10/24/2019   Procedure: CARDIOVERSION;  Surgeon: Wellington Hampshire, MD;  Location: [facility] ORS;  Service: Cardiovascular;  Laterality: N/A;  . COLONOSCOPY    . COLONOSCOPY N/A 01/30/2020   Procedure: COLONOSCOPY;  Surgeon: Toledo, Benay Pike, MD;  Location: [facility] ENDOSCOPY;  Service: Gastroenterology;  Laterality: N/A;  . COLONOSCOPY WITH PROPOFOL N/A 11/29/2017   Procedure: COLONOSCOPY WITH PROPOFOL;  Surgeon: [name], MD;  Location: [facility] ENDOSCOPY;  Service: Endoscopy;  Laterality: N/A;  . CORONARY ANGIOPLASTY     stent  . CORONARY ARTERY BYPASS GRAFT    . CORONARY STENT PLACEMENT    . ESOPHAGOGASTRODUODENOSCOPY (EGD) WITH PROPOFOL N/A 04/18/2017   Procedure: ESOPHAGOGASTRODUODENOSCOPY (EGD) WITH PROPOFOL;  Surgeon: [name], MD;  Location: [facility] ENDOSCOPY;  Service: Endoscopy;  Laterality: N/A;  . ESOPHAGOGASTRODUODENOSCOPY (EGD) WITH PROPOFOL N/A 11/29/2017   Procedure: ESOPHAGOGASTRODUODENOSCOPY (EGD) WITH PROPOFOL;  Surgeon: [name], MD;  Location: [facility] ENDOSCOPY;  Service: Endoscopy;  Laterality: N/A;  . INSERT / REPLACE / REMOVE PACEMAKER    . PPM GENERATOR CHANGEOUT N/A 09/05/2019   Procedure: PPM GENERATOR CHANGEOUT;  Surgeon: [name], MD;  Location: [facility] LAB;  Service: Cardiovascular;  Laterality: N/A;  . s/p pacer insertion    . TRANSANAL EXCISION OF RECTAL MASS N/A 01/17/2018   Procedure: TRANSANAL EXCISION OF RECTAL POLYP;  Surgeon: [name], MD;  Location: [facility] ORS;  Service: General;  Laterality:  N/A;    Current Medications: Current Meds  Medication Sig  . albuterol (VENTOLIN HFA) 108 (90 Base) MCG/ACT inhaler Inhale 2 puffs into the lungs every 6 (six) hours as needed for wheezing or shortness of breath.  [name] apixaban (ELIQUIS) 5 MG TABS tablet Take 1 tablet (5 mg total) by mouth 2 (two) times daily.  . cholecalciferol (VITAMIN D3) 25 MCG (1000 UNIT) tablet Take 1,000 Units by mouth daily.  . cholestyramine (QUESTRAN) 4 g packet Take 1 packet (4 g total) by mouth daily. in water  . donepezil (ARICEPT) 10 MG tablet Take 10 mg by mouth daily.   . Ferrous Sulfate (SLOW FE) 142 (45 Fe) MG TBCR Take 1 tablet by mouth twice daily  . gabapentin (NEURONTIN) 300 MG capsule Take 1-2 capsules (300-600 mg total) by mouth 3 (three) times daily. Take 300 mg in the morning, 300 mg in the afternoon and 600 mg at night  . hydrocortisone (ANUSOL-HC) 25 MG suppository Place 1 suppository (25 mg total) rectally 2 (two) times daily. Okay to substitute generic  . hydrOXYzine (ATARAX/VISTARIL) 25 MG tablet Take 25 mg by mouth 2 (two) times daily.  . insulin detemir (LEVEMIR) 100 UNIT/ML FlexPen Inject 5 Units into the skin every evening.  . Insulin Pen Needle 31G X 8 MM MISC 1 Dose by Does not apply route at bedtime.  [name] levothyroxine (SYNTHROID) 125 MCG tablet Take 125 mcg by mouth daily.   [name] LINZESS 72 MCG capsule Take 72 mcg by mouth every morning.  . liver oil-zinc oxide (DESITIN) 40 % ointment Apply topically 2 (two) times daily. Apply to buttock and groin ares  . loratadine (CLARITIN) 10 MG tablet Take 10 mg by mouth daily.   . meclizine (ANTIVERT) 12.5 MG tablet Take 1 tablet (12.5 mg total) by mouth 2 (two) times daily as needed for dizziness.  . metoprolol succinate (TOPROL-XL) 25 MG 24 hr tablet Take 0.5 tablets (12.5 mg total) by mouth at bedtime.  . montelukast (SINGULAIR) 10 MG tablet Take 10 mg by mouth at bedtime.   . Multiple Vitamin (MULTIVITAMIN WITH MINERALS) TABS tablet Take 1 tablet by  mouth daily.  . nitroGLYCERIN (NITROSTAT) 0.4 MG SL tablet Place 0.4 mg under the tongue every 5 (five) minutes as needed for chest pain.   [name] OLANZapine (ZYPREXA) 2.5 MG tablet Take 2.5 mg by mouth in the morning and at bedtime.   [name] omeprazole (PRILOSEC) 40 MG capsule Take 40 mg by mouth 2 (two) times daily.  . pioglitazone (ACTOS) 30 MG tablet Take 30 mg by mouth daily.  . potassium chloride SA (KLOR-CON) 20 MEQ tablet Take 2 tablets (40 mEq total) by mouth daily.  . rosuvastatin (CRESTOR) 40 MG tablet Take 40 mg by mouth daily.  . sertraline (ZOLOFT) 100 MG tablet Take 100 mg by mouth 2 (two) times daily.  . silver sulfADIAZINE (SILVADENE) 1 % cream Apply 1 application topically 2 (two) times daily.  Apply to left heel wound  (please help her with dressing supplies to purchase.  I was thinking a 4x4 on the wound and then she could use a piece of a Kerlex to hold it in place so one Kerlex would last for multiple dressings) (Patient taking differently: Apply 1 application topically 2 (two) times daily. Apply to left heel wound  (please help her with dressing supplies to purchase.  I was thinking a 4x4 on the wound and then she could use a piece of a Kerlex to hold it in place so one Kerlex would last for multiple dressings))  . traMADol (ULTRAM) 50 MG tablet Take 1 tablet (50 mg total) by mouth every 8 (eight) hours as needed for moderate pain.  . traZODone (DESYREL) 100 MG tablet Take 1 tablet (100 mg total) by mouth at bedtime.  . vitamin B-12 (CYANOCOBALAMIN) 1000 MCG tablet Take 1,000 mcg by mouth daily.  . vitamin E 180 MG (400 UNITS) capsule Take 400 Units by mouth daily.  . [DISCONTINUED] torsemide (DEMADEX) 20 MG tablet Take 2 tablets (40 mg) once daily in the AM & take 2 tablets (40 mg) on Mondays, Wednesdays, & Fridays at lunch time     Allergies:   Penicillins   Social History   Socioeconomic History  . Marital status: Widowed    Spouse name: Not on file  . Number of children:  Not on file  . Years of education: Not on file  . Highest education level: Not on file  Occupational History  . Occupation: retired  Tobacco Use  . Smoking status: Never Smoker  . Smokeless tobacco: Never Used  Vaping Use  . Vaping Use: Never used  Substance and Sexual Activity  . Alcohol use: No  . Drug use: No  . Sexual activity: Never  Other Topics Concern  . Not on file  Social History Narrative  . Not on file   Social Determinants of Health   Financial Resource Strain: Not on file  Food Insecurity: Not on file  Transportation Needs: Not on file  Physical Activity: Not on file  Stress: Not on file  Social Connections: Not on file     Family History: The patient's family history includes Aneurysm in her mother; Cancer in her mother; Depression in her mother; Diabetes in her brother, brother, mother, and sister; Heart disease in her father; Hypertension in her mother.  ROS:   Please see the history of present illness.     All other systems reviewed and are negative.  EKGs/Labs/Other Studies Reviewed:    The following studies were reviewed today:   EKG:  EKG is  ordered today.  The ekg ordered today demonstrates atrial paced rhythm, A paced rhythm  Recent Labs: 09/29/2019: B Natriuretic Peptide 359.9 01/21/2020: TSH 0.954 01/29/2020: ALT 18 01/30/2020: BUN 13; Creatinine, Ser 0.83; Hemoglobin 9.5; Magnesium 1.8; Platelets 143; Potassium 3.3; Sodium 142  Recent Lipid Panel    Component Value Date/Time   CHOL 131 10/25/2019 0600   TRIG 78 10/25/2019 0600   HDL 72 10/25/2019 0600   CHOLHDL 1.8 10/25/2019 0600   VLDL 16 10/25/2019 0600   LDLCALC 43 10/25/2019 0600    Physical Exam:    VS:  BP 120/70 (BP Location: Right Arm, Patient Position: Sitting, Cuff Size: Large)   Pulse 85   Ht 5\' 6"  (1.676 m)   Wt 198 lb (89.8 kg)   SpO2 95%   BMI 31.96 kg/m     Wt Readings from Last  3 Encounters:  04/24/20 198 lb (89.8 kg)  04/03/20 189 lb (85.7 kg)  03/06/20  192 lb (87.1 kg)     GEN:  Well nourished, well developed in no acute distress HEENT: Normal NECK: No JVD; No carotid bruits LYMPHATICS: No lymphadenopathy CARDIAC: RRR, no murmurs, rubs, gallops RESPIRATORY:  Clear to auscultation without rales, wheezing or rhonchi  ABDOMEN: Soft, non-tender, distended MUSCULOSKELETAL: 2+ lower extremity edema, worse on right SKIN: Warm and dry NEUROLOGIC:  Alert and oriented x 3 PSYCHIATRIC:  Normal affect   ASSESSMENT:    1. Heart failure with preserved ejection fraction, unspecified HF chronicity ([place])   2. Hx of CABG   3. Pacemaker   4. PAF (paroxysmal atrial fibrillation) ([facility])    PLAN:    In order of problems listed above:  1. Patient with hx of HFpEF, G2DD, EF 50-55%, has lower extremity edema and also weight gain.  Increase torsemide to 40 mg a.m. and p.m. daily.  Check BMP in 1 week.  Repeat electrolytes if any abnormalities.  Plan to add metolazone 2.5 mg at follow-up if no improvement in fluid levels. 2. History of CAD/CABG.  Eliquis and Crestor 40 mg. 3. History of permanent pacemaker.  Sees device clinic for frequent checks. 4. History of A. fib, EKG today shows paced rhythm.  Continue Toprol-XL.  Follow up in 2 weeks.  Total encounter 40 minutes  Greater than 50% was spent in counseling and coordination of care with the patient   This note was generated in part or whole with voice recognition software. Voice recognition is usually quite accurate but there are transcription errors that can and very often do occur. I apologize for any typographical errors that were not detected and corrected.  Medication Adjustments/Labs and Tests Ordered: Current medicines are reviewed at length with the patient today.  Concerns regarding medicines are outlined above.  Orders Placed This Encounter  Procedures  . Basic metabolic panel  . EKG 12-Lead   Meds ordered this encounter  Medications  . torsemide (DEMADEX) 20 MG tablet    Sig:  Take 2 tablets (40 mg total) by mouth 2 (two) times daily.    Patient Instructions  Medication Instructions:   Your physician has recommended you make the following change in your medication:   1.  INCREASE your Torsemide to 40 MG twice daily.  *If you need a refill on your cardiac medications before your next appointment, please call your pharmacy*   Lab Work:  Your physician recommends that you return for lab work in: 1 week.  - Please go to the [facility]. You will check in at the front desk to the right as you walk into the atrium. Valet Parking is offered if needed. - No appointment needed. You may go any day between 7 am and 6 pm.     Testing/Procedures: None ordered   Follow-Up: At [facility], you and your health needs are our priority.  As part of our continuing mission to provide you with exceptional heart care, we have created designated Provider Care Teams.  These Care Teams include your primary Cardiologist (physician) and Advanced Practice Providers (APPs -  Physician Assistants and Nurse Practitioners) who all work together to provide you with the care you need, when you need it.  We recommend signing up for the patient portal called "MyChart".  Sign up information is provided on this After Visit Summary.  MyChart is used to connect with patients for Virtual Visits (Telemedicine).  Patients  are able to view lab/test results, encounter notes, upcoming appointments, etc.  Non-urgent messages can be sent to your provider as well.   To learn more about what you can do with MyChart, go to [email or web address].    Your next appointment:   2 week(s)  The format for your next appointment:   In Person  Provider:   Kate Sable, MD   Other Instructions      Signed, Kate Sable, MD  04/24/2020 3:41 PM    [place]

## 2020-04-27 ENCOUNTER — Telehealth: Payer: Self-pay | Admitting: Internal Medicine

## 2020-04-27 NOTE — Telephone Encounter (Signed)
Patient calling in after cancelling a pacemaker check with [facility] because she was unsure why she was scheduled there and not with Dr. Caryl Comes. I instructed I would send a message to the nurse for followup and to see where patient needs to be checked out at  Please advise

## 2020-04-27 NOTE — Telephone Encounter (Signed)
Called patient and left a message on patient's voicemail in reference to a follow up with Dr. Caryl Comes. Patient is on the call back list for April of 2022. Device Clinic number was also left if patient has any questions at [phone].

## 2020-05-04 ENCOUNTER — Telehealth: Payer: Self-pay | Admitting: Emergency Medicine

## 2020-05-04 NOTE — Telephone Encounter (Signed)
Attempted to contact patient in reference to follow-up with Dr. Caryl Comes. Patient is on the recall list for April. Patient had some previous questions and concerns about follow-up with Dr. Caryl Comes. Left voicemail confirming that patient is aware of call back in April, for follow up with Dr. Caryl Comes in May. Device number left on voicemail at [phone] if patient has any questions or concerns. Previous voicemail left for patient.

## 2020-05-04 NOTE — Telephone Encounter (Signed)
Discussed with Dr Amalia Hailey and we would recommend pt get scheduled to see Dr Prudence Davidson for a diabetic foot exam and it maybe best to wait until mid April so that he can trim the nails if needed but it has to be after 61 days for insurance to cover the nail trim. We just need it documented that pt qualifies for diabetic shoes. Then once this is done and she qualifies we can get her in to see Liliane Channel for diabetic shoes. I asked for a call back to discuss further.

## 2020-05-05 NOTE — Telephone Encounter (Signed)
Pts daughter called me but had not received the message I left yesterday. I explained that Dr Amalia Hailey did not do a diabetic foot exam and we would recommend pt come in to see Dr Prudence Davidson in April for the diabetic foot exam to verify pt meets qualifications for the diabetic shoes.  I have scheduled pt for Dr Prudence Davidson in April and also with [facility] for the diabetic shoe measurements

## 2020-05-08 ENCOUNTER — Other Ambulatory Visit: Payer: Self-pay

## 2020-05-08 ENCOUNTER — Encounter: Payer: Self-pay | Admitting: Cardiology

## 2020-05-08 ENCOUNTER — Ambulatory Visit (INDEPENDENT_AMBULATORY_CARE_PROVIDER_SITE_OTHER): Payer: Medicare Other | Admitting: Cardiology

## 2020-05-08 VITALS — BP 112/62 | HR 62 | Ht 66.0 in | Wt 202.0 lb

## 2020-05-08 DIAGNOSIS — I5030 Unspecified diastolic (congestive) heart failure: Secondary | ICD-10-CM | POA: Diagnosis not present

## 2020-05-08 DIAGNOSIS — Z951 Presence of aortocoronary bypass graft: Secondary | ICD-10-CM | POA: Diagnosis not present

## 2020-05-08 DIAGNOSIS — I480 Paroxysmal atrial fibrillation: Secondary | ICD-10-CM | POA: Diagnosis not present

## 2020-05-08 DIAGNOSIS — Z950 Presence of cardiac pacemaker: Secondary | ICD-10-CM | POA: Diagnosis not present

## 2020-05-08 MED ORDER — METOLAZONE 5 MG PO TABS: 5.0000 mg | ORAL_TABLET | Freq: Every day | ORAL | 0 refills | Status: DC

## 2020-05-08 NOTE — Telephone Encounter (Addendum)
Attempted to contact patient. Left voicemail for patient to call the [facility] in reference to questions about follow up appointment with Dr. Caryl Comes. Will send scheduling a message to contact patient.

## 2020-05-08 NOTE — Progress Notes (Signed)
Cardiology Office Note:    Date:  05/08/2020   ID:  Kristi [name], DOB 06-06-1940, MRN [id number]  PCP:  Martin Majestic, FNP  Cardiologist:  Kate Sable, MD  Electrophysiologist:  None   Referring MD: Martin Majestic, *   Chief Complaint  Patient presents with  . Other    2 week follow up - patient c.o swelling in ankles and stomach and SOB. Meds reviewed verbally with patient.     History of Present Illness:    Kristi [name] is a 80 y.o. female with a hx of diabetes, HFpEF, parox A. fib on Eliquis hypertension, hyperlipidemia, CAD, prior PCI x5 stents,CABGx3 over 15 years ago, permanent pacemaker, lymphedema who presents for follow-up.    Being seen for A. fib, CAD, heart failure preserved ejection fraction.  She was volume overloaded during last clinic visit.  Torsemide was increased to 40 mg twice daily.  She states her symptoms of edema are the same even with increased doses of torsemide.  She eats a low-salt diet, takes all her medications as prescribed.  Prior notes  She sleeps on a hospital bed at home and has required to place it on an incline.   Patient has a history of bilateral chronic venous insufficiency in the deep vein system of the lower extremity, and lymphedema, being seen at the vein clinic.  Has compression stockings and lymphedema pump device prescribed by specialist.    Echocardiogram on 11/2018 showed low normal ejection fraction with EF 50 to 55%, pseudonormal diastolic function.  Stress Myoview in 10/07/2016 was normal. Pacemaker had inverted atrial and ventricular leads status post revision on 6/21.  A. fib during hospital admission 10/2019.  Status post DC cardioversion 10/24/2019.  Amiodarone stopped due to GI issues.  Past Medical History:  Diagnosis Date  . Anxiety   . Arthritis   . Broken arm    left FA 3/17  . Broken arm    left  . CAD (coronary artery disease)    s/p MI  . Cancer ([place])    uterine  . Cervical disc disorder   .  Depression   . Diabetes mellitus without complication ([place])   . GERD (gastroesophageal reflux disease)   . Headache   . Hyperlipidemia   . Hypertension   . Hypothyroid   . Myocardial infarction ([place])    15 year ago  . Neck pain 06/04/2014  . Parkinson's disease ([place])   . Presence of permanent cardiac pacemaker   . Spinal stenosis     Past Surgical History:  Procedure Laterality Date  . ABDOMINAL HYSTERECTOMY     partial  . BREAST SURGERY    . CARDIOVERSION N/A 10/24/2019   Procedure: CARDIOVERSION;  Surgeon: Wellington Hampshire, MD;  Location: [facility] ORS;  Service: Cardiovascular;  Laterality: N/A;  . COLONOSCOPY    . COLONOSCOPY N/A 01/30/2020   Procedure: COLONOSCOPY;  Surgeon: Toledo, Benay Pike, MD;  Location: [facility] ENDOSCOPY;  Service: Gastroenterology;  Laterality: N/A;  . COLONOSCOPY WITH PROPOFOL N/A 11/29/2017   Procedure: COLONOSCOPY WITH PROPOFOL;  Surgeon: [name], MD;  Location: [facility] ENDOSCOPY;  Service: Endoscopy;  Laterality: N/A;  . CORONARY ANGIOPLASTY     stent  . CORONARY ARTERY BYPASS GRAFT    . CORONARY STENT PLACEMENT    . ESOPHAGOGASTRODUODENOSCOPY (EGD) WITH PROPOFOL N/A 04/18/2017   Procedure: ESOPHAGOGASTRODUODENOSCOPY (EGD) WITH PROPOFOL;  Surgeon: [name], MD;  Location: [facility] ENDOSCOPY;  Service: Endoscopy;  Laterality: N/A;  . ESOPHAGOGASTRODUODENOSCOPY (EGD) WITH PROPOFOL  N/A 11/29/2017   Procedure: ESOPHAGOGASTRODUODENOSCOPY (EGD) WITH PROPOFOL;  Surgeon: [name], MD;  Location: [facility] ENDOSCOPY;  Service: Endoscopy;  Laterality: N/A;  . INSERT / REPLACE / REMOVE PACEMAKER    . PPM GENERATOR CHANGEOUT N/A 09/05/2019   Procedure: PPM GENERATOR CHANGEOUT;  Surgeon: [name], MD;  Location: [facility] CV LAB;  Service: Cardiovascular;  Laterality: N/A;  . s/p pacer insertion    . TRANSANAL EXCISION OF RECTAL MASS N/A 01/17/2018   Procedure: TRANSANAL EXCISION OF RECTAL POLYP;  Surgeon: [name], MD;  Location: [facility] ORS;   Service: General;  Laterality: N/A;    Current Medications: Current Meds  Medication Sig  . albuterol (VENTOLIN HFA) 108 (90 Base) MCG/ACT inhaler Inhale 2 puffs into the lungs every 6 (six) hours as needed for wheezing or shortness of breath.  [name] apixaban (ELIQUIS) 5 MG TABS tablet Take 1 tablet (5 mg total) by mouth 2 (two) times daily.  . cholecalciferol (VITAMIN D3) 25 MCG (1000 UNIT) tablet Take 1,000 Units by mouth daily.  . cholestyramine (QUESTRAN) 4 g packet Take 1 packet (4 g total) by mouth daily. in water  . donepezil (ARICEPT) 10 MG tablet Take 10 mg by mouth daily.   . Ferrous Sulfate (SLOW FE) 142 (45 Fe) MG TBCR Take 1 tablet by mouth twice daily  . gabapentin (NEURONTIN) 300 MG capsule Take 1-2 capsules (300-600 mg total) by mouth 3 (three) times daily. Take 300 mg in the morning, 300 mg in the afternoon and 600 mg at night  . hydrocortisone (ANUSOL-HC) 25 MG suppository Place 1 suppository (25 mg total) rectally 2 (two) times daily. Okay to substitute generic  . hydrOXYzine (ATARAX/VISTARIL) 25 MG tablet Take 25 mg by mouth 2 (two) times daily.  . insulin detemir (LEVEMIR) 100 UNIT/ML FlexPen Inject 5 Units into the skin every evening.  . Insulin Pen Needle 31G X 8 MM MISC 1 Dose by Does not apply route at bedtime.  [name] levothyroxine (SYNTHROID) 125 MCG tablet Take 125 mcg by mouth daily.   [name] LINZESS 72 MCG capsule Take 72 mcg by mouth every morning.  . liver oil-zinc oxide (DESITIN) 40 % ointment Apply topically 2 (two) times daily. Apply to buttock and groin ares  . loratadine (CLARITIN) 10 MG tablet Take 10 mg by mouth daily.   . meclizine (ANTIVERT) 12.5 MG tablet Take 1 tablet (12.5 mg total) by mouth 2 (two) times daily as needed for dizziness.  . metolazone (ZAROXOLYN) 5 MG tablet Take 1 tablet (5 mg total) by mouth daily.  . metoprolol succinate (TOPROL-XL) 25 MG 24 hr tablet Take 0.5 tablets (12.5 mg total) by mouth at bedtime.  . montelukast (SINGULAIR) 10 MG tablet  Take 10 mg by mouth at bedtime.   . Multiple Vitamin (MULTIVITAMIN WITH MINERALS) TABS tablet Take 1 tablet by mouth daily.  . nitroGLYCERIN (NITROSTAT) 0.4 MG SL tablet Place 0.4 mg under the tongue every 5 (five) minutes as needed for chest pain.   [name] OLANZapine (ZYPREXA) 2.5 MG tablet Take 2.5 mg by mouth in the morning and at bedtime.   [name] omeprazole (PRILOSEC) 40 MG capsule Take 40 mg by mouth 2 (two) times daily.  . pioglitazone (ACTOS) 30 MG tablet Take 30 mg by mouth daily.  . potassium chloride SA (KLOR-CON) 20 MEQ tablet Take 2 tablets (40 mEq total) by mouth daily.  . rosuvastatin (CRESTOR) 40 MG tablet Take 40 mg by mouth daily.  . sertraline (ZOLOFT) 100 MG  tablet Take 100 mg by mouth 2 (two) times daily.  . silver sulfADIAZINE (SILVADENE) 1 % cream Apply 1 application topically daily. Apply to left heel wound  (please help her with dressing supplies to purchase.  I was thinking a 4x4 on the wound and then she could use a piece of a Kerlex to hold it in place so one Kerlex would last for multiple dressings)  . torsemide (DEMADEX) 20 MG tablet Take 2 tablets (40 mg total) by mouth 2 (two) times daily.  . traMADol (ULTRAM) 50 MG tablet Take 1 tablet (50 mg total) by mouth every 8 (eight) hours as needed for moderate pain.  . traZODone (DESYREL) 100 MG tablet Take 1 tablet (100 mg total) by mouth at bedtime.  . vitamin B-12 (CYANOCOBALAMIN) 1000 MCG tablet Take 1,000 mcg by mouth daily.  . vitamin E 180 MG (400 UNITS) capsule Take 400 Units by mouth daily.     Allergies:   Penicillins   Social History   Socioeconomic History  . Marital status: Widowed    Spouse name: Not on file  . Number of children: Not on file  . Years of education: Not on file  . Highest education level: Not on file  Occupational History  . Occupation: retired  Tobacco Use  . Smoking status: Never Smoker  . Smokeless tobacco: Never Used  Vaping Use  . Vaping Use: Never used  Substance and Sexual  Activity  . Alcohol use: No  . Drug use: No  . Sexual activity: Never  Other Topics Concern  . Not on file  Social History Narrative  . Not on file   Social Determinants of Health   Financial Resource Strain: Not on file  Food Insecurity: Not on file  Transportation Needs: Not on file  Physical Activity: Not on file  Stress: Not on file  Social Connections: Not on file     Family History: The patient's family history includes Aneurysm in her mother; Cancer in her mother; Depression in her mother; Diabetes in her brother, brother, mother, and sister; Heart disease in her father; Hypertension in her mother.  ROS:   Please see the history of present illness.     All other systems reviewed and are negative.  EKGs/Labs/Other Studies Reviewed:    The following studies were reviewed today:   EKG:  EKG is  ordered today.  The ekg ordered today demonstrates atrial paced rhythm,  Recent Labs: 09/29/2019: B Natriuretic Peptide 359.9 01/21/2020: TSH 0.954 01/29/2020: ALT 18 01/30/2020: BUN 13; Creatinine, Ser 0.83; Hemoglobin 9.5; Magnesium 1.8; Platelets 143; Potassium 3.3; Sodium 142  Recent Lipid Panel    Component Value Date/Time   CHOL 131 10/25/2019 0600   TRIG 78 10/25/2019 0600   HDL 72 10/25/2019 0600   CHOLHDL 1.8 10/25/2019 0600   VLDL 16 10/25/2019 0600   LDLCALC 43 10/25/2019 0600    Physical Exam:    VS:  BP 112/62 (BP Location: Right Arm, Patient Position: Sitting, Cuff Size: Normal)   Pulse 62   Ht 5\' 6"  (1.676 m)   Wt 202 lb (91.6 kg)   BMI 32.60 kg/m     Wt Readings from Last 3 Encounters:  05/08/20 202 lb (91.6 kg)  04/24/20 198 lb (89.8 kg)  04/03/20 189 lb (85.7 kg)     GEN:  Well nourished, well developed in no acute distress HEENT: Normal NECK: No JVD; No carotid bruits LYMPHATICS: No lymphadenopathy CARDIAC: RRR, no murmurs, rubs, gallops RESPIRATORY:  Clear  to auscultation without rales, wheezing or rhonchi  ABDOMEN: Soft, non-tender,  distended MUSCULOSKELETAL: 2+ lower extremity edema, worse on right SKIN: Warm and dry NEUROLOGIC:  Alert and oriented x 3 PSYCHIATRIC:  Normal affect   ASSESSMENT:    1. Heart failure with preserved ejection fraction, unspecified HF chronicity ([place])   2. Hx of CABG   3. Pacemaker   4. PAF (paroxysmal atrial fibrillation) (H[facility])    PLAN:    In order of problems listed above:  1. Patient with hx of HFpEF, G2DD, EF 50-55%, has lower extremity edema and also weight gain.  Start metolazone 5 mg daily, continue torsemide 40 mg twice daily.  Check BMP in 1 week. 2. History of CAD/CABG.  Eliquis and Crestor 40 mg. 3. History of permanent pacemaker.  device clinic for frequent checks. 4. History of A. fib, EKG today shows paced rhythm.  Continue eliquis,Toprol-XL.  Follow up in 2 weeks.  Total encounter 40 minutes  Greater than 50% was spent in counseling and coordination of care with the patient   This note was generated in part or whole with voice recognition software. Voice recognition is usually quite accurate but there are transcription errors that can and very often do occur. I apologize for any typographical errors that were not detected and corrected.  Medication Adjustments/Labs and Tests Ordered: Current medicines are reviewed at length with the patient today.  Concerns regarding medicines are outlined above.  Orders Placed This Encounter  Procedures  . Basic Metabolic Panel (BMET)  . EKG 12-Lead   Meds ordered this encounter  Medications  . metolazone (ZAROXOLYN) 5 MG tablet    Sig: Take 1 tablet (5 mg total) by mouth daily.    Dispense:  30 tablet    Refill:  0    Patient Instructions  Medication Instructions:  Your physician has recommended you make the following change in your medication:    START Metolazone 5 mg daily. An Rx has been sent to your pharmacy.   *If you need a refill on your cardiac medications before your next appointment, please call your  pharmacy*   Lab Work: Your physician recommends that you return for lab work (bmp) in: 1 week  Please have your lab drawn at the [facility]. You do not need an appt. Lab hours are Mon- Fri 8am-6pm.  If you have labs (blood work) drawn today and your tests are completely normal, you will receive your results only by: [name] MyChart Message (if you have MyChart) OR . A paper copy in the mail If you have any lab test that is abnormal or we need to change your treatment, we will call you to review the results.   Testing/Procedures: None ordered   Follow-Up: At [facility], you and your health needs are our priority.  As part of our continuing mission to provide you with exceptional heart care, we have created designated Provider Care Teams.  These Care Teams include your primary Cardiologist (physician) and Advanced Practice Providers (APPs -  Physician Assistants and Nurse Practitioners) who all work together to provide you with the care you need, when you need it.  We recommend signing up for the patient portal called "MyChart".  Sign up information is provided on this After Visit Summary.  MyChart is used to connect with patients for Virtual Visits (Telemedicine).  Patients are able to view lab/test results, encounter notes, upcoming appointments, etc.  Non-urgent messages can be sent to your provider as well.  To learn more about what you can do with MyChart, go to [email or web address].    Your next appointment:   2 week(s)  The format for your next appointment:   In Person  Provider:   Kate Sable, MD   Other Instructions N/A     Signed, Kate Sable, MD  05/08/2020 5:11 PM    [place]

## 2020-05-08 NOTE — Patient Instructions (Signed)
Medication Instructions:  Your physician has recommended you make the following change in your medication:    START Metolazone 5 mg daily. An Rx has been sent to your pharmacy.   *If you need a refill on your cardiac medications before your next appointment, please call your pharmacy*   Lab Work: Your physician recommends that you return for lab work (bmp) in: 1 week  Please have your lab drawn at the [facility]. You do not need an appt. Lab hours are Mon- Fri 8am-6pm.  If you have labs (blood work) drawn today and your tests are completely normal, you will receive your results only by: [name] MyChart Message (if you have MyChart) OR . A paper copy in the mail If you have any lab test that is abnormal or we need to change your treatment, we will call you to review the results.   Testing/Procedures: None ordered   Follow-Up: At [facility], you and your health needs are our priority.  As part of our continuing mission to provide you with exceptional heart care, we have created designated Provider Care Teams.  These Care Teams include your primary Cardiologist (physician) and Advanced Practice Providers (APPs -  Physician Assistants and Nurse Practitioners) who all work together to provide you with the care you need, when you need it.  We recommend signing up for the patient portal called "MyChart".  Sign up information is provided on this After Visit Summary.  MyChart is used to connect with patients for Virtual Visits (Telemedicine).  Patients are able to view lab/test results, encounter notes, upcoming appointments, etc.  Non-urgent messages can be sent to your provider as well.   To learn more about what you can do with MyChart, go to [email or web address].    Your next appointment:   2 week(s)  The format for your next appointment:   In Person  Provider:   Kate Sable, MD   Other Instructions N/A

## 2020-05-15 ENCOUNTER — Inpatient Hospital Stay
Admission: EM | Admit: 2020-05-15 | Discharge: 2020-05-21 | DRG: 394 | Disposition: A | Discharge status: Skilled Nursing Facility | Payer: Medicare Other | Attending: Internal Medicine | Admitting: Internal Medicine

## 2020-05-15 ENCOUNTER — Other Ambulatory Visit: Payer: Self-pay

## 2020-05-15 ENCOUNTER — Emergency Department: Payer: Medicare Other

## 2020-05-15 DIAGNOSIS — R7401 Elevation of levels of liver transaminase levels: Secondary | ICD-10-CM

## 2020-05-15 DIAGNOSIS — Z79890 Hormone replacement therapy (postmenopausal): Secondary | ICD-10-CM

## 2020-05-15 DIAGNOSIS — Z6831 Body mass index (BMI) 31.0-31.9, adult: Secondary | ICD-10-CM

## 2020-05-15 DIAGNOSIS — E1140 Type 2 diabetes mellitus with diabetic neuropathy, unspecified: Secondary | ICD-10-CM | POA: Diagnosis present

## 2020-05-15 DIAGNOSIS — Z85048 Personal history of other malignant neoplasm of rectum, rectosigmoid junction, and anus: Secondary | ICD-10-CM

## 2020-05-15 DIAGNOSIS — I252 Old myocardial infarction: Secondary | ICD-10-CM

## 2020-05-15 DIAGNOSIS — M549 Dorsalgia, unspecified: Secondary | ICD-10-CM | POA: Diagnosis present

## 2020-05-15 DIAGNOSIS — I5032 Chronic diastolic (congestive) heart failure: Secondary | ICD-10-CM | POA: Diagnosis present

## 2020-05-15 DIAGNOSIS — K922 Gastrointestinal hemorrhage, unspecified: Secondary | ICD-10-CM | POA: Diagnosis not present

## 2020-05-15 DIAGNOSIS — Z8249 Family history of ischemic heart disease and other diseases of the circulatory system: Secondary | ICD-10-CM

## 2020-05-15 DIAGNOSIS — R253 Fasciculation: Secondary | ICD-10-CM | POA: Diagnosis not present

## 2020-05-15 DIAGNOSIS — Z79899 Other long term (current) drug therapy: Secondary | ICD-10-CM

## 2020-05-15 DIAGNOSIS — Z880 Allergy status to penicillin: Secondary | ICD-10-CM

## 2020-05-15 DIAGNOSIS — E669 Obesity, unspecified: Secondary | ICD-10-CM | POA: Diagnosis present

## 2020-05-15 DIAGNOSIS — E871 Hypo-osmolality and hyponatremia: Secondary | ICD-10-CM | POA: Diagnosis present

## 2020-05-15 DIAGNOSIS — G4700 Insomnia, unspecified: Secondary | ICD-10-CM | POA: Diagnosis present

## 2020-05-15 DIAGNOSIS — K623 Rectal prolapse: Secondary | ICD-10-CM | POA: Diagnosis present

## 2020-05-15 DIAGNOSIS — Z7901 Long term (current) use of anticoagulants: Secondary | ICD-10-CM

## 2020-05-15 DIAGNOSIS — I482 Chronic atrial fibrillation, unspecified: Secondary | ICD-10-CM | POA: Diagnosis present

## 2020-05-15 DIAGNOSIS — N179 Acute kidney failure, unspecified: Secondary | ICD-10-CM

## 2020-05-15 DIAGNOSIS — R197 Diarrhea, unspecified: Secondary | ICD-10-CM | POA: Diagnosis not present

## 2020-05-15 DIAGNOSIS — R531 Weakness: Secondary | ICD-10-CM

## 2020-05-15 DIAGNOSIS — Z87892 Personal history of anaphylaxis: Secondary | ICD-10-CM

## 2020-05-15 DIAGNOSIS — G894 Chronic pain syndrome: Secondary | ICD-10-CM | POA: Diagnosis present

## 2020-05-15 DIAGNOSIS — Z951 Presence of aortocoronary bypass graft: Secondary | ICD-10-CM

## 2020-05-15 DIAGNOSIS — K625 Hemorrhage of anus and rectum: Secondary | ICD-10-CM | POA: Diagnosis present

## 2020-05-15 DIAGNOSIS — Z955 Presence of coronary angioplasty implant and graft: Secondary | ICD-10-CM | POA: Diagnosis not present

## 2020-05-15 DIAGNOSIS — I2510 Atherosclerotic heart disease of native coronary artery without angina pectoris: Secondary | ICD-10-CM | POA: Diagnosis present

## 2020-05-15 DIAGNOSIS — E782 Mixed hyperlipidemia: Secondary | ICD-10-CM | POA: Diagnosis present

## 2020-05-15 DIAGNOSIS — I998 Other disorder of circulatory system: Secondary | ICD-10-CM | POA: Diagnosis present

## 2020-05-15 DIAGNOSIS — F0280 Dementia in other diseases classified elsewhere without behavioral disturbance: Secondary | ICD-10-CM | POA: Diagnosis present

## 2020-05-15 DIAGNOSIS — K219 Gastro-esophageal reflux disease without esophagitis: Secondary | ICD-10-CM | POA: Diagnosis present

## 2020-05-15 DIAGNOSIS — E11649 Type 2 diabetes mellitus with hypoglycemia without coma: Secondary | ICD-10-CM | POA: Diagnosis not present

## 2020-05-15 DIAGNOSIS — R296 Repeated falls: Secondary | ICD-10-CM | POA: Diagnosis present

## 2020-05-15 DIAGNOSIS — D62 Acute posthemorrhagic anemia: Secondary | ICD-10-CM | POA: Diagnosis present

## 2020-05-15 DIAGNOSIS — I10 Essential (primary) hypertension: Secondary | ICD-10-CM | POA: Diagnosis present

## 2020-05-15 DIAGNOSIS — M48061 Spinal stenosis, lumbar region without neurogenic claudication: Secondary | ICD-10-CM | POA: Diagnosis present

## 2020-05-15 DIAGNOSIS — E876 Hypokalemia: Secondary | ICD-10-CM

## 2020-05-15 DIAGNOSIS — R7989 Other specified abnormal findings of blood chemistry: Secondary | ICD-10-CM | POA: Diagnosis present

## 2020-05-15 DIAGNOSIS — Z818 Family history of other mental and behavioral disorders: Secondary | ICD-10-CM

## 2020-05-15 DIAGNOSIS — E1169 Type 2 diabetes mellitus with other specified complication: Secondary | ICD-10-CM | POA: Diagnosis present

## 2020-05-15 DIAGNOSIS — E039 Hypothyroidism, unspecified: Secondary | ICD-10-CM | POA: Diagnosis present

## 2020-05-15 DIAGNOSIS — R278 Other lack of coordination: Secondary | ICD-10-CM | POA: Diagnosis present

## 2020-05-15 DIAGNOSIS — I4819 Other persistent atrial fibrillation: Secondary | ICD-10-CM | POA: Diagnosis not present

## 2020-05-15 DIAGNOSIS — I4891 Unspecified atrial fibrillation: Secondary | ICD-10-CM | POA: Diagnosis present

## 2020-05-15 DIAGNOSIS — C211 Malignant neoplasm of anal canal: Secondary | ICD-10-CM

## 2020-05-15 DIAGNOSIS — I272 Pulmonary hypertension, unspecified: Secondary | ICD-10-CM | POA: Diagnosis present

## 2020-05-15 DIAGNOSIS — Z20822 Contact with and (suspected) exposure to covid-19: Secondary | ICD-10-CM | POA: Diagnosis present

## 2020-05-15 DIAGNOSIS — E785 Hyperlipidemia, unspecified: Secondary | ICD-10-CM

## 2020-05-15 DIAGNOSIS — Z950 Presence of cardiac pacemaker: Secondary | ICD-10-CM | POA: Diagnosis not present

## 2020-05-15 DIAGNOSIS — K6289 Other specified diseases of anus and rectum: Secondary | ICD-10-CM | POA: Diagnosis not present

## 2020-05-15 DIAGNOSIS — Y842 Radiological procedure and radiotherapy as the cause of abnormal reaction of the patient, or of later complication, without mention of misadventure at the time of the procedure: Secondary | ICD-10-CM | POA: Diagnosis present

## 2020-05-15 DIAGNOSIS — Z923 Personal history of irradiation: Secondary | ICD-10-CM

## 2020-05-15 DIAGNOSIS — E861 Hypovolemia: Secondary | ICD-10-CM | POA: Diagnosis present

## 2020-05-15 DIAGNOSIS — F32A Depression, unspecified: Secondary | ICD-10-CM | POA: Diagnosis present

## 2020-05-15 DIAGNOSIS — K627 Radiation proctitis: Principal | ICD-10-CM | POA: Diagnosis present

## 2020-05-15 DIAGNOSIS — M5416 Radiculopathy, lumbar region: Secondary | ICD-10-CM | POA: Diagnosis present

## 2020-05-15 DIAGNOSIS — G20 Parkinson's disease: Secondary | ICD-10-CM | POA: Diagnosis present

## 2020-05-15 DIAGNOSIS — Z794 Long term (current) use of insulin: Secondary | ICD-10-CM | POA: Diagnosis not present

## 2020-05-15 DIAGNOSIS — Z90711 Acquired absence of uterus with remaining cervical stump: Secondary | ICD-10-CM | POA: Diagnosis not present

## 2020-05-15 DIAGNOSIS — Z809 Family history of malignant neoplasm, unspecified: Secondary | ICD-10-CM

## 2020-05-15 DIAGNOSIS — G253 Myoclonus: Secondary | ICD-10-CM

## 2020-05-15 DIAGNOSIS — Z833 Family history of diabetes mellitus: Secondary | ICD-10-CM

## 2020-05-15 DIAGNOSIS — F419 Anxiety disorder, unspecified: Secondary | ICD-10-CM | POA: Diagnosis present

## 2020-05-15 DIAGNOSIS — R339 Retention of urine, unspecified: Secondary | ICD-10-CM | POA: Diagnosis not present

## 2020-05-15 DIAGNOSIS — I110 Hypertensive heart disease with heart failure: Secondary | ICD-10-CM | POA: Diagnosis present

## 2020-05-15 DIAGNOSIS — E878 Other disorders of electrolyte and fluid balance, not elsewhere classified: Secondary | ICD-10-CM | POA: Diagnosis present

## 2020-05-15 LAB — PROTIME-INR
INR: 1.2 (ref 0.8–1.2)
Prothrombin Time: 14.5 seconds (ref 11.4–15.2)

## 2020-05-15 LAB — CBC
HCT: 36.1 % (ref 36.0–46.0)
Hemoglobin: 11.7 g/dL — ABNORMAL LOW (ref 12.0–15.0)
MCH: 30.6 pg (ref 26.0–34.0)
MCHC: 32.4 g/dL (ref 30.0–36.0)
MCV: 94.5 fL (ref 80.0–100.0)
Platelets: 209 10*3/uL (ref 150–400)
RBC: 3.82 MIL/uL — ABNORMAL LOW (ref 3.87–5.11)
RDW: 15.3 % (ref 11.5–15.5)
WBC: 5.2 10*3/uL (ref 4.0–10.5)
nRBC: 0 % (ref 0.0–0.2)

## 2020-05-15 LAB — COMPREHENSIVE METABOLIC PANEL
ALT: 139 U/L — ABNORMAL HIGH (ref 0–44)
AST: 166 U/L — ABNORMAL HIGH (ref 15–41)
Albumin: 4.4 g/dL (ref 3.5–5.0)
Alkaline Phosphatase: 96 U/L (ref 38–126)
Anion gap: 19 — ABNORMAL HIGH (ref 5–15)
BUN: 37 mg/dL — ABNORMAL HIGH (ref 8–23)
CO2: 37 mmol/L — ABNORMAL HIGH (ref 22–32)
Calcium: 9.4 mg/dL (ref 8.9–10.3)
Chloride: 77 mmol/L — ABNORMAL LOW (ref 98–111)
Creatinine, Ser: 2.43 mg/dL — ABNORMAL HIGH (ref 0.44–1.00)
GFR, Estimated: 20 mL/min — ABNORMAL LOW (ref >60)
Glucose, Bld: 251 mg/dL — ABNORMAL HIGH (ref 70–99)
Potassium: <2 mmol/L — CL (ref 3.5–5.1)
Sodium: 133 mmol/L — ABNORMAL LOW (ref 135–145)
Total Bilirubin: 0.9 mg/dL (ref 0.3–1.2)
Total Protein: 8.2 g/dL — ABNORMAL HIGH (ref 6.5–8.1)

## 2020-05-15 LAB — MAGNESIUM: Magnesium: 2.4 mg/dL (ref 1.7–2.4)

## 2020-05-15 LAB — RESP PANEL BY RT-PCR (FLU A&B, COVID) ARPGX2
Influenza A by PCR: NEGATIVE
Influenza B by PCR: NEGATIVE
SARS Coronavirus 2 by RT PCR: NEGATIVE

## 2020-05-15 LAB — GLUCOSE, CAPILLARY: Glucose-Capillary: 195 mg/dL — ABNORMAL HIGH (ref 70–99)

## 2020-05-15 LAB — HEMOGLOBIN AND HEMATOCRIT, BLOOD
HCT: 32.7 % — ABNORMAL LOW (ref 36.0–46.0)
Hemoglobin: 10.5 g/dL — ABNORMAL LOW (ref 12.0–15.0)

## 2020-05-15 LAB — APTT: aPTT: 36 seconds (ref 24–36)

## 2020-05-15 MED ORDER — INSULIN DETEMIR 100 UNIT/ML ~~LOC~~ SOLN
5.0000 [IU] | Freq: Every day | SUBCUTANEOUS | Status: DC
  Administered 2020-05-15 – 2020-05-19 (×5): 5 [IU] via SUBCUTANEOUS
  Filled 2020-05-15 (×7): qty 0.05

## 2020-05-15 MED ORDER — MONTELUKAST SODIUM 10 MG PO TABS
10.0000 mg | ORAL_TABLET | Freq: Every day | ORAL | Status: DC
  Administered 2020-05-15 – 2020-05-20 (×6): 10 mg via ORAL
  Filled 2020-05-15 (×6): qty 1

## 2020-05-15 MED ORDER — FERROUS SULFATE 325 (65 FE) MG PO TABS
325.0000 mg | ORAL_TABLET | Freq: Two times a day (BID) | ORAL | Status: DC
  Administered 2020-05-16 – 2020-05-21 (×9): 325 mg via ORAL
  Filled 2020-05-15 (×10): qty 1

## 2020-05-15 MED ORDER — ONDANSETRON HCL 4 MG PO TABS: 4.0000 mg | ORAL_TABLET | Freq: Four times a day (QID) | ORAL | Status: DC | PRN

## 2020-05-15 MED ORDER — MECLIZINE HCL 12.5 MG PO TABS
12.5000 mg | ORAL_TABLET | Freq: Two times a day (BID) | ORAL | Status: DC | PRN
  Filled 2020-05-15: qty 0.5

## 2020-05-15 MED ORDER — TRAMADOL HCL 50 MG PO TABS: 50.0000 mg | ORAL_TABLET | Freq: Three times a day (TID) | ORAL | Status: DC | PRN

## 2020-05-15 MED ORDER — LORATADINE 10 MG PO TABS
10.0000 mg | ORAL_TABLET | Freq: Every day | ORAL | Status: DC
  Administered 2020-05-16 – 2020-05-21 (×5): 10 mg via ORAL
  Filled 2020-05-15 (×5): qty 1

## 2020-05-15 MED ORDER — TRAZODONE HCL 100 MG PO TABS
100.0000 mg | ORAL_TABLET | Freq: Every day | ORAL | Status: DC
  Administered 2020-05-15 – 2020-05-20 (×6): 100 mg via ORAL
  Filled 2020-05-15 (×6): qty 1

## 2020-05-15 MED ORDER — DONEPEZIL HCL 5 MG PO TABS
10.0000 mg | ORAL_TABLET | Freq: Every day | ORAL | Status: DC
  Administered 2020-05-15 – 2020-05-20 (×6): 10 mg via ORAL
  Filled 2020-05-15 (×9): qty 2

## 2020-05-15 MED ORDER — LINACLOTIDE 72 MCG PO CAPS
72.0000 ug | ORAL_CAPSULE | Freq: Every morning | ORAL | Status: DC
  Administered 2020-05-16 – 2020-05-19 (×3): 72 ug via ORAL
  Filled 2020-05-15 (×5): qty 1

## 2020-05-15 MED ORDER — POTASSIUM CHLORIDE 10 MEQ/100ML IV SOLN
10.0000 meq | INTRAVENOUS | Status: AC
  Administered 2020-05-15 (×3): 10 meq via INTRAVENOUS
  Filled 2020-05-15 (×2): qty 100

## 2020-05-15 MED ORDER — SODIUM CHLORIDE 0.9 % IV BOLUS
1000.0000 mL | Freq: Once | INTRAVENOUS | Status: AC
  Administered 2020-05-15: 1000 mL via INTRAVENOUS

## 2020-05-15 MED ORDER — ACETAMINOPHEN 650 MG RE SUPP: 650.0000 mg | Freq: Four times a day (QID) | RECTAL | Status: DC | PRN

## 2020-05-15 MED ORDER — TRAZODONE HCL 50 MG PO TABS: 25.0000 mg | ORAL_TABLET | Freq: Every evening | ORAL | Status: DC | PRN

## 2020-05-15 MED ORDER — METOPROLOL SUCCINATE ER 25 MG PO TB24
12.5000 mg | ORAL_TABLET | Freq: Every day | ORAL | Status: DC
  Administered 2020-05-15 – 2020-05-20 (×6): 12.5 mg via ORAL
  Filled 2020-05-15 (×6): qty 1

## 2020-05-15 MED ORDER — LEVOTHYROXINE SODIUM 125 MCG PO TABS
125.0000 ug | ORAL_TABLET | Freq: Every day | ORAL | Status: DC
  Administered 2020-05-16 – 2020-05-20 (×5): 125 ug via ORAL
  Filled 2020-05-15 (×7): qty 1

## 2020-05-15 MED ORDER — ACETAMINOPHEN 325 MG PO TABS
650.0000 mg | ORAL_TABLET | Freq: Four times a day (QID) | ORAL | Status: DC | PRN
  Administered 2020-05-16: 650 mg via ORAL
  Filled 2020-05-15: qty 2

## 2020-05-15 MED ORDER — VITAMIN E 45 MG (100 UNIT) PO CAPS
400.0000 [IU] | ORAL_CAPSULE | Freq: Every day | ORAL | Status: DC
  Administered 2020-05-16 – 2020-05-21 (×5): 400 [IU] via ORAL
  Filled 2020-05-15 (×6): qty 4

## 2020-05-15 MED ORDER — ONDANSETRON HCL 4 MG/2ML IJ SOLN: 4.0000 mg | Freq: Four times a day (QID) | INTRAMUSCULAR | Status: DC | PRN

## 2020-05-15 MED ORDER — ALBUTEROL SULFATE HFA 108 (90 BASE) MCG/ACT IN AERS
2.0000 | INHALATION_SPRAY | Freq: Four times a day (QID) | RESPIRATORY_TRACT | Status: DC | PRN
  Filled 2020-05-15: qty 6.7

## 2020-05-15 MED ORDER — GABAPENTIN 300 MG PO CAPS: 300.0000 mg | ORAL_CAPSULE | Freq: Three times a day (TID) | ORAL | Status: DC

## 2020-05-15 MED ORDER — GABAPENTIN 300 MG PO CAPS
600.0000 mg | ORAL_CAPSULE | Freq: Every day | ORAL | Status: DC
  Administered 2020-05-15 – 2020-05-17 (×3): 600 mg via ORAL
  Filled 2020-05-15 (×3): qty 2

## 2020-05-15 MED ORDER — ADULT MULTIVITAMIN W/MINERALS CH
1.0000 | ORAL_TABLET | Freq: Every day | ORAL | Status: DC
  Administered 2020-05-16 – 2020-05-21 (×5): 1 via ORAL
  Filled 2020-05-15 (×5): qty 1

## 2020-05-15 MED ORDER — POTASSIUM CHLORIDE 20 MEQ PO PACK
80.0000 meq | PACK | Freq: Once | ORAL | Status: AC
  Administered 2020-05-15: 80 meq via ORAL
  Filled 2020-05-15: qty 4

## 2020-05-15 MED ORDER — OLANZAPINE 2.5 MG PO TABS
2.5000 mg | ORAL_TABLET | Freq: Two times a day (BID) | ORAL | Status: DC
  Administered 2020-05-15 – 2020-05-19 (×7): 2.5 mg via ORAL
  Filled 2020-05-15: qty 1
  Filled 2020-05-15 (×5): qty 0.5
  Filled 2020-05-15 (×4): qty 1

## 2020-05-15 MED ORDER — POTASSIUM CHLORIDE CRYS ER 20 MEQ PO TBCR
40.0000 meq | EXTENDED_RELEASE_TABLET | Freq: Every day | ORAL | Status: DC
  Administered 2020-05-15 – 2020-05-16 (×2): 40 meq via ORAL
  Filled 2020-05-15 (×2): qty 2

## 2020-05-15 MED ORDER — HYDROXYZINE HCL 25 MG PO TABS
25.0000 mg | ORAL_TABLET | Freq: Two times a day (BID) | ORAL | Status: DC
  Administered 2020-05-15 – 2020-05-21 (×11): 25 mg via ORAL
  Filled 2020-05-15 (×11): qty 1

## 2020-05-15 MED ORDER — ROSUVASTATIN CALCIUM 20 MG PO TABS: 40.0000 mg | ORAL_TABLET | Freq: Every day | ORAL | Status: DC

## 2020-05-15 MED ORDER — GABAPENTIN 300 MG PO CAPS
300.0000 mg | ORAL_CAPSULE | Freq: Two times a day (BID) | ORAL | Status: DC
  Administered 2020-05-16 (×2): 300 mg via ORAL
  Filled 2020-05-15 (×2): qty 1

## 2020-05-15 MED ORDER — SERTRALINE HCL 50 MG PO TABS
100.0000 mg | ORAL_TABLET | Freq: Two times a day (BID) | ORAL | Status: DC
  Administered 2020-05-15 – 2020-05-21 (×11): 100 mg via ORAL
  Filled 2020-05-15 (×11): qty 2

## 2020-05-15 MED ORDER — NITROGLYCERIN 0.4 MG SL SUBL: 0.4000 mg | SUBLINGUAL_TABLET | SUBLINGUAL | Status: DC | PRN

## 2020-05-15 MED ORDER — VITAMIN B-12 1000 MCG PO TABS
1000.0000 ug | ORAL_TABLET | Freq: Every day | ORAL | Status: DC
  Administered 2020-05-16 – 2020-05-21 (×5): 1000 ug via ORAL
  Filled 2020-05-15 (×5): qty 1

## 2020-05-15 MED ORDER — INSULIN ASPART 100 UNIT/ML ~~LOC~~ SOLN
0.0000 [IU] | Freq: Three times a day (TID) | SUBCUTANEOUS | Status: DC
  Administered 2020-05-15: 2 [IU] via SUBCUTANEOUS
  Administered 2020-05-16: 1 [IU] via SUBCUTANEOUS
  Administered 2020-05-16 – 2020-05-17 (×2): 2 [IU] via SUBCUTANEOUS
  Administered 2020-05-18: 1 [IU] via SUBCUTANEOUS
  Administered 2020-05-20: 2 [IU] via SUBCUTANEOUS
  Administered 2020-05-20: 1 [IU] via SUBCUTANEOUS
  Administered 2020-05-21: 2 [IU] via SUBCUTANEOUS
  Filled 2020-05-15 (×8): qty 1

## 2020-05-15 MED ORDER — VITAMIN D 25 MCG (1000 UNIT) PO TABS
1000.0000 [IU] | ORAL_TABLET | Freq: Every day | ORAL | Status: DC
  Administered 2020-05-16 – 2020-05-21 (×5): 1000 [IU] via ORAL
  Filled 2020-05-15 (×5): qty 1

## 2020-05-15 MED ORDER — POTASSIUM CHLORIDE IN NACL 20-0.9 MEQ/L-% IV SOLN
INTRAVENOUS | Status: DC
  Filled 2020-05-15 (×10): qty 1000

## 2020-05-15 NOTE — ED Provider Notes (Signed)
[facility] Emergency Department Provider Note  ____________________________________________  Time seen: Approximately 8:29 PM  I have reviewed the triage vital signs and the nursing notes.   HISTORY  Chief Complaint uncontrollable jerking movements  Level 5 Caveat: Portions of the History and Physical including HPI and review of systems are unable to be completely obtained due to patient being a poor historian    HPI Kristi [name] is a 80 y.o. female with a past history of anal cancer, hypertension, hypothyroidism, Parkinson's disease who complains of generalized weakness over the past 3 weeks, couple of falls recently.  Also complains that her muscles are jerky.  Also reports that she has been having rectal bleeding and left lower quadrant abdominal pain.  Symptoms are constant, waxing and waning, no aggravating or alleviating factors.  Denies head injury, no headache or vision changes, no paresthesias or focal weakness.  She is on Eliquis and torsemide.   She reports she is been eating and drinking normally.     Past Medical History:  Diagnosis Date  . Anxiety   . Arthritis   . Broken arm    left FA 3/17  . Broken arm    left  . CAD (coronary artery disease)    s/p MI  . Cancer ([place])    uterine  . Cervical disc disorder   . Depression   . Diabetes mellitus without complication ([place])   . GERD (gastroesophageal reflux disease)   . Headache   . Hyperlipidemia   . Hypertension   . Hypothyroid   . Myocardial infarction ([place])    15 year ago  . Neck pain 06/04/2014  . Parkinson's disease ([place])   . Presence of permanent cardiac pacemaker   . Spinal stenosis      Patient Active Problem List   Diagnosis Date Noted  . GI bleeding 05/15/2020  . GI bleed 01/28/2020  . Rectal bleed 01/28/2020  . At risk for polypharmacy 01/28/2020  . Acute delirium   . HCAP (healthcare-associated pneumonia)   . Persistent atrial fibrillation ([place])   .  Hypotension   . Hyponatremia   . Lobar pneumonia ([place]) 10/23/2019  . Atrial flutter ([place])   . Dizziness   . Atrial fibrillation status post cardioversion ([place]) 09/27/2019  . CHF (congestive heart failure) ([place]) 09/26/2019  . Acute on chronic diastolic heart failure ([place]) 09/25/2019  . Hypertensive urgency 09/25/2019  . Fluid overload 09/25/2019  . Excessive gas 05/14/2019  . Generalized bloating 05/14/2019  . Bright red blood per rectum 05/14/2019  . 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 Chest pain 11/20/2018  . Difficulty walking 11/19/2018  . Insomnia 11/15/2018  . Type 2 diabetes mellitus with hyperlipidemia ([place]) 11/15/2018  . Goals of care, counseling/discussion 02/06/2018  . Cancer of anal canal ([place]) 01/25/2018  . Rectal mass 01/05/2018  . Leg pain, bilateral 12/06/2017  . Lymphedema 07/10/2017  . Atherosclerosis of native arteries of the extremities with ulceration ([place]) 07/10/2017  . Stricture and stenosis of esophagus   . Dysphagia   . Syncope and collapse 04/11/2017  . Mild dementia ([place]) 01/24/2017  . Lumbar facet osteoarthritis (Bilateral) 01/17/2017  . Acute postoperative pain 01/17/2017  . Hypokalemia 12/24/2016  . Anxiety 12/07/2016  . 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 Cerebrovascular accident ([place]) 12/07/2016  . Chronic depression 12/07/2016  . Essential hypertension 12/07/2016  . Hypothyroidism 12/07/2016  . Mixed hyperlipidemia 12/07/2016  . Myocardial infarction ([place]) 12/07/2016  . TIA (transient  ischemic attack) 11/17/2016  . Parkinson disease ([place]) 10/27/2016  . Tremor 10/27/2016  . Chronic knee pain (B) (R>L) 10/19/2016  . Ischemic chest pain ([place]) 09/29/2016  . Abnormal CT scan, lumbar spine 09/08/2016  . Lumbar foraminal stenosis (multilevel) 09/08/2016  . Fracture of clavicle 08/03/2016  . Polyneuropathy 05/17/2016  . Neurogenic pain 04/12/2016  . Chronic lower extremity pain  (Bilateral) (R>L) 04/12/2016  . Chronic lower extremity radicular pain (Primary Source of Pain) (Bilateral) (R>L) 04/12/2016  . Lower extremity weakness 04/12/2016  . Chronic low back pain (Secondary source of pain) (Bilateral) (R>L) 04/12/2016  . Chronic wrist pain (Left) (secondary to fracture) 04/12/2016  . Lumbar spondylosis 04/12/2016  . Chronic pain syndrome 04/11/2016  . Long term current use of opiate analgesic 04/11/2016  . Long term prescription opiate use 04/11/2016  . Opiate use 04/11/2016  . Long term prescription benzodiazepine use 04/11/2016  . Lumbar radiculopathy (Multilevel) (Bilateral) 05/07/2015  . Lumbar facet syndrome (Bilateral) (R>L) 08/21/2014  . 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 Sacroiliac joint dysfunction (Bilateral) 08/13/2014  . Frequent falls 06/04/2014  . Memory loss 06/04/2014  . Anxiety and depression 08/22/2013  . CAD (coronary artery disease) 08/22/2013  . Therapeutic opioid-induced constipation (OIC) 08/22/2013  . GERD (gastroesophageal reflux disease) 08/22/2013     Past Surgical History:  Procedure Laterality Date  . ABDOMINAL HYSTERECTOMY     partial  . BREAST SURGERY    . CARDIOVERSION N/A 10/24/2019   Procedure: CARDIOVERSION;  Surgeon: Wellington Hampshire, MD;  Location: [facility] ORS;  Service: Cardiovascular;  Laterality: N/A;  . COLONOSCOPY    . COLONOSCOPY N/A 01/30/2020   Procedure: COLONOSCOPY;  Surgeon: Toledo, Benay Pike, MD;  Location: [facility] ENDOSCOPY;  Service: Gastroenterology;  Laterality: N/A;  . COLONOSCOPY WITH PROPOFOL N/A 11/29/2017   Procedure: COLONOSCOPY WITH PROPOFOL;  Surgeon: [name], MD;  Location: [facility] ENDOSCOPY;  Service: Endoscopy;  Laterality: N/A;  . CORONARY ANGIOPLASTY     stent  . CORONARY ARTERY BYPASS GRAFT    . CORONARY STENT PLACEMENT    . ESOPHAGOGASTRODUODENOSCOPY (EGD) WITH PROPOFOL N/A 04/18/2017   Procedure: ESOPHAGOGASTRODUODENOSCOPY (EGD) WITH PROPOFOL;   Surgeon: [name], MD;  Location: [facility] ENDOSCOPY;  Service: Endoscopy;  Laterality: N/A;  . ESOPHAGOGASTRODUODENOSCOPY (EGD) WITH PROPOFOL N/A 11/29/2017   Procedure: ESOPHAGOGASTRODUODENOSCOPY (EGD) WITH PROPOFOL;  Surgeon: [name], MD;  Location: [facility] ENDOSCOPY;  Service: Endoscopy;  Laterality: N/A;  . INSERT / REPLACE / REMOVE PACEMAKER    . PPM GENERATOR CHANGEOUT N/A 09/05/2019   Procedure: PPM GENERATOR CHANGEOUT;  Surgeon: [name], MD;  Location: [place] CV LAB;  Service: Cardiovascular;  Laterality: N/A;  . s/p pacer insertion    . TRANSANAL EXCISION OF RECTAL MASS N/A 01/17/2018   Procedure: TRANSANAL EXCISION OF RECTAL POLYP;  Surgeon: [name], MD;  Location: [facility] ORS;  Service: General;  Laterality: N/A;     Prior to Admission medications   Medication Sig Start Date End Date Taking? Authorizing Provider  albuterol (VENTOLIN HFA) 108 (90 Base) MCG/ACT inhaler Inhale 2 puffs into the lungs every 6 (six) hours as needed for wheezing or shortness of breath. 10/28/19   Loletha Grayer, MD  apixaban (ELIQUIS) 5 MG TABS tablet Take 1 tablet (5 mg total) by mouth 2 (two) times daily. 09/30/19   Jennye Boroughs, MD  cholecalciferol (VITAMIN D3) 25 MCG (1000 UNIT) tablet Take 1,000 Units by mouth daily.    [provider]  cholestyramine (  QUESTRAN) 4 g packet Take 1 packet (4 g total) by mouth daily. in water 10/28/19   Loletha Grayer, MD  donepezil (ARICEPT) 10 MG tablet Take 10 mg by mouth daily.     [provider]  Ferrous Sulfate (SLOW FE) 142 (45 Fe) MG TBCR Take 1 tablet by mouth twice daily 11/19/19   [name], MD  gabapentin (NEURONTIN) 300 MG capsule Take 1-2 capsules (300-600 mg total) by mouth 3 (three) times daily. Take 300 mg in the morning, 300 mg in the afternoon and 600 mg at night 01/30/20   Jennye Boroughs, MD  hydrocortisone (ANUSOL-HC) 25 MG suppository Place 1 suppository (25 mg total) rectally 2 (two) times daily.  Okay to substitute generic 10/28/19   Loletha Grayer, MD  hydrOXYzine (ATARAX/VISTARIL) 25 MG tablet Take 25 mg by mouth 2 (two) times daily. 01/27/20   [provider]  insulin detemir (LEVEMIR) 100 UNIT/ML FlexPen Inject 5 Units into the skin every evening. 10/28/19   Wieting, Richard, MD  Insulin Pen Needle 31G X 8 MM MISC 1 Dose by Does not apply route at bedtime. 10/28/19   Loletha Grayer, MD  levothyroxine (SYNTHROID) 125 MCG tablet Take 125 mcg by mouth daily.  12/25/19   [provider]  LINZESS 72 MCG capsule Take 72 mcg by mouth every morning. 12/04/19   [provider]  liver oil-zinc oxide (DESITIN) 40 % ointment Apply topically 2 (two) times daily. Apply to buttock and groin ares 10/28/19   Loletha Grayer, MD  loratadine (CLARITIN) 10 MG tablet Take 10 mg by mouth daily.     [provider]  meclizine (ANTIVERT) 12.5 MG tablet Take 1 tablet (12.5 mg total) by mouth 2 (two) times daily as needed for dizziness. 09/30/19   Jennye Boroughs, MD  metolazone (ZAROXOLYN) 5 MG tablet Take 1 tablet (5 mg total) by mouth daily. 05/08/20 08/06/20  Kate Sable, MD  metoprolol succinate (TOPROL-XL) 25 MG 24 hr tablet Take 0.5 tablets (12.5 mg total) by mouth at bedtime. 10/28/19   Loletha Grayer, MD  montelukast (SINGULAIR) 10 MG tablet Take 10 mg by mouth at bedtime.  07/23/19   [provider]  Multiple Vitamin (MULTIVITAMIN WITH MINERALS) TABS tablet Take 1 tablet by mouth daily.    [provider]  nitroGLYCERIN (NITROSTAT) 0.4 MG SL tablet Place 0.4 mg under the tongue every 5 (five) minutes as needed for chest pain.     [provider]  OLANZapine (ZYPREXA) 2.5 MG tablet Take 2.5 mg by mouth in the morning and at bedtime.     [provider]  omeprazole (PRILOSEC) 40 MG capsule Take 40 mg by mouth 2 (two) times daily. 10/22/18   [provider]  pioglitazone (ACTOS) 30 MG tablet Take 30 mg by mouth daily. 12/25/19    [provider]  potassium chloride SA (KLOR-CON) 20 MEQ tablet Take 2 tablets (40 mEq total) by mouth daily. 10/29/19   Loletha Grayer, MD  rosuvastatin (CRESTOR) 40 MG tablet Take 40 mg by mouth daily. 10/22/18   [provider]  sertraline (ZOLOFT) 100 MG tablet Take 100 mg by mouth 2 (two) times daily. 11/19/18 04/24/30  [provider]  silver sulfADIAZINE (SILVADENE) 1 % cream Apply 1 application topically daily. Apply to left heel wound  (please help her with dressing supplies to purchase.  I was thinking a 4x4 on the wound and then she could use a piece of a Kerlex to hold it in place so  one Kerlex would last for multiple dressings)    [provider]  torsemide (DEMADEX) 20 MG tablet Take 2 tablets (40 mg total) by mouth 2 (two) times daily. 04/24/20   Kate Sable, MD  traMADol (ULTRAM) 50 MG tablet Take 1 tablet (50 mg total) by mouth every 8 (eight) hours as needed for moderate pain. 12/26/19   Johnn Hai, PA-C  traZODone (DESYREL) 100 MG tablet Take 1 tablet (100 mg total) by mouth at bedtime. 10/28/19   Loletha Grayer, MD  vitamin B-12 (CYANOCOBALAMIN) 1000 MCG tablet Take 1,000 mcg by mouth daily.    [provider]  vitamin E 180 MG (400 UNITS) capsule Take 400 Units by mouth daily.    [provider]     Allergies Penicillins   Family History  Problem Relation Age of Onset  . Cancer Mother   . Depression Mother   . Diabetes Mother   . Hypertension Mother   . Aneurysm Mother   . Heart disease Father   . Diabetes Sister   . Diabetes Brother   . Diabetes Brother     Social History Social History   Tobacco Use  . Smoking status: Never Smoker  . Smokeless tobacco: Never Used  Vaping Use  . Vaping Use: Never used  Substance Use Topics  . Alcohol use: No  . Drug use: No    Review of Systems  Constitutional:   No fever or chills.  ENT:   No sore throat. No rhinorrhea. Cardiovascular:   No chest pain  or syncope. Respiratory:   No dyspnea or cough. Gastrointestinal:   Negative for abdominal pain, vomiting and diarrhea.  Musculoskeletal:   Negative for focal pain or swelling All other systems reviewed and are negative except as documented above in ROS and HPI.  ____________________________________________   PHYSICAL EXAM:  VITAL SIGNS: ED Triage Vitals  Enc Vitals Group     BP 05/15/20 1709 121/76     Pulse Rate 05/15/20 1709 (!) 59     Resp 05/15/20 1709 20     Temp 05/15/20 1709 98.2 F (36.8 C)     Temp Source 05/15/20 1709 Oral     SpO2 05/15/20 1709 95 %     Weight 05/15/20 1600 190 lb (86.2 kg)     Height 05/15/20 1600 5\' 6"  (1.676 m)     Head Circumference --      Peak Flow --      Pain Score 05/15/20 1600 0     Pain Loc --      Pain Edu? --      Excl. in [place]? --     Vital signs reviewed, nursing assessments reviewed.   Constitutional:   Alert and oriented. Non-toxic appearance. Eyes:   Conjunctivae are normal. EOMI. PERRL. ENT      Head:   Normocephalic and atraumatic.      Nose: Normal      Mouth/Throat: Dry mucous membranes      Neck:   No meningismus. Full ROM. Hematological/Lymphatic/Immunilogical:   No cervical lymphadenopathy. Cardiovascular:   RRR. Symmetric bilateral radial and DP pulses.  No murmurs. Cap refill less than 2 seconds. Respiratory:   Normal respiratory effort without tachypnea/retractions. Breath sounds are clear and equal bilaterally. No wheezes/rales/rhonchi. Gastrointestinal:   Soft with left lower quadrant abdominal tenderness. Non distended. There is no CVA tenderness.  No rebound, rigidity, or guarding.  Rectal exam performed with nurse Brandy at bedside, shows frank red blood.  Musculoskeletal:  Normal range of motion in all extremities. No joint effusions.  No lower extremity tenderness.  No edema. Neurologic:   Normal speech and language.  Motor grossly intact. No acute focal neurologic deficits are appreciated.  Skin:     Skin is warm, dry and intact. No rash noted.  No petechiae, purpura, or bullae.  ____________________________________________    LABS (pertinent positives/negatives) (all labs ordered are listed, but only abnormal results are displayed) Labs Reviewed  CBC - Abnormal; Notable for the following components:      Result Value   RBC 3.82 (*)    Hemoglobin 11.7 (*)    All other components within normal limits  COMPREHENSIVE METABOLIC PANEL - Abnormal; Notable for the following components:   Sodium 133 (*)    Potassium <2.0 (*)    Chloride 77 (*)    CO2 37 (*)    Glucose, Bld 251 (*)    BUN 37 (*)    Creatinine, Ser 2.43 (*)    Total Protein 8.2 (*)    AST 166 (*)    ALT 139 (*)    GFR, Estimated 20 (*)    Anion gap 19 (*)    All other components within normal limits  RESP PANEL BY RT-PCR (FLU A&B, COVID) ARPGX2  MAGNESIUM  BASIC METABOLIC PANEL  CBC  PROTIME-INR  APTT  HEMOGLOBIN AND HEMATOCRIT, BLOOD  HEMOGLOBIN AND HEMATOCRIT, BLOOD  HEMOGLOBIN AND HEMATOCRIT, BLOOD   ____________________________________________   EKG Interpreted by me Sinus rhythm with tachycardia, rate of 116.  Normal axis and intervals.  Normal QRS ST segments and T waves   ____________________________________________    RADIOLOGY  CT ABDOMEN PELVIS WO CONTRAST  Result Date: 05/15/2020 CLINICAL DATA:  p/w gastrointestinal bleed. Abdominal abscess/infection suspected. History of rectal cancer. Status post radiation therapy. EXAM: CT ABDOMEN AND PELVIS WITHOUT CONTRAST TECHNIQUE: Multidetector CT imaging of the abdomen and pelvis was performed following the standard protocol without IV contrast. COMPARISON:  CT abdomen pelvis 01/28/2020 FINDINGS: Lower chest: Punctate metallic densities again noted within the left lung base. Partially visualized cardiac pacemaker. At least small volume hiatal hernia. Hepatobiliary: No focal liver abnormality. No gallstones, gallbladder wall thickening, or  pericholecystic fluid. No biliary dilatation. Pancreas: No focal lesion. Normal pancreatic contour. No surrounding inflammatory changes. No main pancreatic ductal dilatation. Spleen: Normal in size without focal abnormality. Adrenals/Urinary Tract: No adrenal nodule bilaterally. No nephrolithiasis, no hydronephrosis, and no contour-deforming renal mass. No ureterolithiasis or hydroureter. The urinary bladder is unremarkable. Stomach/Bowel: Stomach is within normal limits. No evidence of bowel wall thickening or dilatation. Diffuse sigmoid diverticulosis. Under-distension of the rectum with limited evaluation. Appendix appears normal. Vascular/Lymphatic: No abdominal aorta or iliac aneurysm. Mild to moderate atherosclerotic plaque of the aorta and its branches. No abdominal, pelvic, or inguinal lymphadenopathy. Reproductive: Status post hysterectomy. No adnexal masses. Other: No intraperitoneal free fluid. No intraperitoneal free gas. No organized fluid collection. Musculoskeletal: No abdominal wall hernia or abnormality Diffusely decreased bone density. No suspicious lytic or blastic osseous lesions. No acute displaced fracture. Multilevel degenerative changes of the spine with grade 1 anterolisthesis of L4 on L5. Mild retrolisthesis of L1 on L2, L2 on L3, L3 on L4. partially visualized intramedullary nail fixation of the right femur. IMPRESSION: 1. Diffuse sigmoid diverticulosis with no acute diverticulitis. 2. At least small volume hiatal hernia. 3. No acute intra-abdominal or intrapelvic abnormality with limited evaluation on this noncontrast study. Under-distension of the rectum with limited evaluation in a patient with history of rectal cancer.  4.  Aortic Atherosclerosis (ICD10-I70.0). Electronically Signed   By: Iven Finn M.D.   On: 05/15/2020 18:40   CT Head Wo Contrast  Result Date: 05/15/2020 CLINICAL DATA:  Increased weakness and fall for the past 3 days. History of rectal cancer. EXAM: CT HEAD  WITHOUT CONTRAST TECHNIQUE: Contiguous axial images were obtained from the base of the skull through the vertex without intravenous contrast. COMPARISON:  CT head 10/23/2019 FINDINGS: Brain: Cerebral ventricle sizes are concordant with the degree of cerebral volume loss. Patchy and confluent areas of decreased attenuation are noted throughout the deep and periventricular white matter of the cerebral hemispheres bilaterally, compatible with chronic microvascular ischemic disease. No evidence of large-territorial acute infarction. No parenchymal hemorrhage. No mass lesion. No extra-axial collection. No mass effect or midline shift. No hydrocephalus. Basilar cisterns are patent. Vascular: No hyperdense vessel. Atherosclerotic calcifications are present within the cavernous internal carotid arteries. Skull: No acute fracture or focal lesion. Sinuses/Orbits: Paranasal sinuses and mastoid air cells are clear. The orbits are unremarkable. Other: None. IMPRESSION: No acute intracranial abnormality. Electronically Signed   By: Iven Finn M.D.   On: 05/15/2020 18:45    ____________________________________________   PROCEDURES .Critical Care Performed by: Carrie Mew, MD Authorized by: Carrie Mew, MD   Critical care provider statement:    Critical care time (minutes):  33   Critical care time was exclusive of:  Separately billable procedures and treating other patients   Critical care was time spent personally by me on the following activities:  Development of treatment plan with patient or surrogate, discussions with consultants, evaluation of patient's response to treatment, examination of patient, obtaining history from patient or surrogate, ordering and performing treatments and interventions, ordering and review of laboratory studies, ordering and review of radiographic studies, pulse oximetry, re-evaluation of patient's condition and review of old  charts    ____________________________________________  DIFFERENTIAL DIAGNOSIS   Electrolyte abnormality, metabolic acidosis, dehydration, AKI, diverticulitis, colitis, radiation proctitis, colorectal cancer, intracranial hemorrhage  CLINICAL IMPRESSION / ASSESSMENT AND PLAN / ED COURSE  Medications ordered in the ED: Medications  traMADol (ULTRAM) tablet 50 mg (has no administration in time range)  metoprolol succinate (TOPROL-XL) 24 hr tablet 12.5 mg (has no administration in time range)  nitroGLYCERIN (NITROSTAT) SL tablet 0.4 mg (has no administration in time range)  rosuvastatin (CRESTOR) tablet 40 mg (has no administration in time range)  donepezil (ARICEPT) tablet 10 mg (has no administration in time range)  hydrOXYzine (ATARAX/VISTARIL) tablet 25 mg (has no administration in time range)  OLANZapine (ZYPREXA) tablet 2.5 mg (has no administration in time range)  sertraline (ZOLOFT) tablet 100 mg (has no administration in time range)  traZODone (DESYREL) tablet 100 mg (has no administration in time range)  insulin detemir (LEVEMIR) FlexPen 5 Units (has no administration in time range)  levothyroxine (SYNTHROID) tablet 125 mcg (has no administration in time range)  linaclotide (LINZESS) capsule 72 mcg (has no administration in time range)  meclizine (ANTIVERT) tablet 12.5 mg (has no administration in time range)  ferrous sulfate tablet 325 mg (has no administration in time range)  vitamin B-12 (CYANOCOBALAMIN) tablet 1,000 mcg (has no administration in time range)  gabapentin (NEURONTIN) capsule 300-600 mg (has no administration in time range)  cholecalciferol (VITAMIN D3) tablet 1,000 Units (has no administration in time range)  multivitamin with minerals tablet 1 tablet (has no administration in time range)  potassium chloride SA (KLOR-CON) CR tablet 40 mEq (has no administration in time range)  vitamin E  capsule 400 Units (has no administration in time range)  albuterol  (VENTOLIN HFA) 108 (90 Base) MCG/ACT inhaler 2 puff (has no administration in time range)  loratadine (CLARITIN) tablet 10 mg (has no administration in time range)  montelukast (SINGULAIR) tablet 10 mg (has no administration in time range)  0.9 % NaCl with KCl 20 mEq/ L  infusion (has no administration in time range)  acetaminophen (TYLENOL) tablet 650 mg (has no administration in time range)    Or  acetaminophen (TYLENOL) suppository 650 mg (has no administration in time range)  traZODone (DESYREL) tablet 25 mg (has no administration in time range)  ondansetron (ZOFRAN) tablet 4 mg (has no administration in time range)    Or  ondansetron (ZOFRAN) injection 4 mg (has no administration in time range)  sodium chloride 0.9 % bolus 1,000 mL (0 mLs Intravenous Stopped 05/15/20 1742)  potassium chloride 10 mEq in 100 mL IVPB (0 mEq Intravenous Stopped 05/15/20 1958)  potassium chloride (KLOR-CON) packet 80 mEq (80 mEq Oral Given 05/15/20 1649)    Pertinent labs & imaging results that were available during my care of the patient were reviewed by me and considered in my medical decision making (see chart for details).  Abygail Galeno was evaluated in Emergency Department on 05/15/2020 for the symptoms described in the history of present illness. She was evaluated in the context of the global COVID-19 pandemic, which necessitated consideration that the patient might be at risk for infection with the SARS-CoV-2 virus that causes COVID-19. Institutional protocols and algorithms that pertain to the evaluation of patients at risk for COVID-19 are in a state of rapid change based on information released by regulatory bodies including the CDC and federal and state organizations. These policies and algorithms were followed during the patient's care in the ED.     Clinical Course as of 05/15/20 2029  Fri May 15, 2020  1706 Patient presents with weakness and muscle jerkiness, multiple falls.  Labs show AKI with a  creatinine of 2.4, potassium is severely low under 2.  Will give IV fluid bolus, IV and p.o. potassium supplementation.  Will need to admit. [PS]    Clinical Course User Index [PS] Carrie Mew, MD     ----------------------------------------- 8:48 PM on 05/15/2020 -----------------------------------------  CT head and CT abdomen pelvis shows no intracranial hemorrhage, no acute intra-abdominal pathology.  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 Dictation errors may occur despite best attempts at proofreading.   Carrie Mew, MD 05/15/20 2048

## 2020-05-15 NOTE — ED Notes (Signed)
Rectal exam performed by dr Joni Fears witnessed by this RN, pt found to have a moderate amount of bright red blood at her rectum as well as a clot, pt states that she is also passing blood from her vagina as well, pt reports that she had radiation treatment for rectal cancer a month ago  Pt also states that she is feeling bad, worse than what she did initially, dr Joni Fears aware

## 2020-05-15 NOTE — H&P (Addendum)
PATIENT NAME: Kristi [name]    MR#:  [id number]  DATE OF BIRTH:  1940/11/23  DATE OF ADMISSION:  05/15/2020  PRIMARY CARE PHYSICIAN: Martin Majestic, FNP   Patient is coming from: Home REQUESTING/REFERRING PHYSICIAN: Brenton Grills, MD  CHIEF COMPLAINT:   Chief Complaint  Patient presents with  . uncontrollable jerking movements    HISTORY OF PRESENT ILLNESS:  Kristi [name] is a 80 y.o. Caucasian female with medical history significant for anxiety, osteoarthritis, coronary artery disease, type 2 diabetes mellitus, hypertension, dyslipidemia, hypothyroidism and Parkinson's disease and status post pacemaker placement, presented to the emergency room with acute onset of worsening generalized weakness and recurrent falls over the last 3 weeks.  He was felt today but denied any injuries.He has been noticed to have involuntary jerking movements over the last 2 weeks as well as bilateral leg cramps.  Unfortunately she was unable to get in touch with her PCP.  She stated also that she could not get out of bed and therefore she called 911.  She was noted to have mild amount of bright red bleeding per rectum and a blood clot was also passing blood vaginally.  She has a history of rectal cancer and had radiotherapy and last session was about a year ago.  She has been having intermittent bleeding during this year.  No melena.  No nausea or vomiting or abdominal pain.  No chest pain or dyspnea or palpitations.  No cough or wheezing.  She had to mother no vaccines for COVID-19.  She denies any fever or chills. ED Course: When she came to the ER vital signs were within normal and later heart rate was 59.  Labs revealed hyponatremia 133 and hypochloremia of 77 with hypokalemia less than 2 and mag levels 2.4.  AST was elevated 166 with ALT 139, total bilirubin of 8.2 and albumin of 4.4.  1 glucose was 251.  CBC showed hemoglobin of 11.7 hematocrit 36.1 better than previous levels in  November.  COVID-19 PCR influenza antigens came back negative.  Abdominal EKG as reviewed by me : showed likely atrial fibrillation with controlled ventricular response. Imaging: Abdominal and pelvic CT scan revealed diffuse sigmoid diverticulosis without diverticulitis,  small volume hiatal hernia with no acute abnormalities.  It showed aortic atherosclerosis.Noncontrasted CT scan revealed no acute intracranial normality.  The patient was given 80 mEq p.o. potassium chloride and 10 mEq IV KCl with 1 L bolus of IV normal saline.  Should be admitted to a progressive unit bed for further evaluation and management. PAST MEDICAL HISTORY:   Past Medical History:  Diagnosis Date  . Anxiety   . Arthritis   . Broken arm    left FA 3/17  . Broken arm    left  . CAD (coronary artery disease)    s/p MI  . Cancer ([place])    uterine  . Cervical disc disorder   . Depression   . Diabetes mellitus without complication ([place])   . GERD (gastroesophageal reflux disease)   . Headache   . Hyperlipidemia   . Hypertension   . Hypothyroid   . Myocardial infarction ([place])    15 year ago  . Neck pain 06/04/2014  . Parkinson's disease ([place])   . Presence of permanent cardiac pacemaker   . Spinal stenosis     PAST SURGICAL HISTORY:   Past Surgical History:  Procedure Laterality Date  . ABDOMINAL HYSTERECTOMY     partial  .  BREAST SURGERY    . CARDIOVERSION N/A 10/24/2019   Procedure: CARDIOVERSION;  Surgeon: [place] Hampshire, MD;  Location: [facility] ORS;  Service: Cardiovascular;  Laterality: N/A;  . COLONOSCOPY    . COLONOSCOPY N/A 01/30/2020   Procedure: COLONOSCOPY;  Surgeon: Toledo, Benay Pike, MD;  Location: [facility] ENDOSCOPY;  Service: Gastroenterology;  Laterality: N/A;  . COLONOSCOPY WITH PROPOFOL N/A 11/29/2017   Procedure: COLONOSCOPY WITH PROPOFOL;  Surgeon: [name], MD;  Location: [facility] ENDOSCOPY;  Service: Endoscopy;  Laterality: N/A;  . CORONARY ANGIOPLASTY     stent  . CORONARY ARTERY  BYPASS GRAFT    . CORONARY STENT PLACEMENT    . ESOPHAGOGASTRODUODENOSCOPY (EGD) WITH PROPOFOL N/A 04/18/2017   Procedure: ESOPHAGOGASTRODUODENOSCOPY (EGD) WITH PROPOFOL;  Surgeon: [name], MD;  Location: [facility] ENDOSCOPY;  Service: Endoscopy;  Laterality: N/A;  . ESOPHAGOGASTRODUODENOSCOPY (EGD) WITH PROPOFOL N/A 11/29/2017   Procedure: ESOPHAGOGASTRODUODENOSCOPY (EGD) WITH PROPOFOL;  Surgeon: [name], MD;  Location: [facility] ENDOSCOPY;  Service: Endoscopy;  Laterality: N/A;  . INSERT / REPLACE / REMOVE PACEMAKER    . PPM GENERATOR CHANGEOUT N/A 09/05/2019   Procedure: PPM GENERATOR CHANGEOUT;  Surgeon: [name], MD;  Location: [place] [facility] LAB;  Service: Cardiovascular;  Laterality: N/A;  . s/p pacer insertion    . TRANSANAL EXCISION OF RECTAL MASS N/A 01/17/2018   Procedure: TRANSANAL EXCISION OF RECTAL POLYP;  Surgeon: [name], MD;  Location: [facility] ORS;  Service: General;  Laterality: N/A;    SOCIAL HISTORY:   Social History   Tobacco Use  . Smoking status: Never Smoker  . Smokeless tobacco: Never Used  Substance Use Topics  . Alcohol use: No    FAMILY HISTORY:   Family History  Problem Relation Age of Onset  . Cancer Mother   . Depression Mother   . Diabetes Mother   . Hypertension Mother   . Aneurysm Mother   . Heart disease Father   . Diabetes Sister   . Diabetes Brother   . Diabetes Brother     DRUG ALLERGIES:   Allergies  Allergen Reactions  . Penicillins Anaphylaxis, Swelling and Rash    TOLERATES CEFTRIAXONE    REVIEW OF SYSTEMS:   ROS As per history of present illness. All pertinent systems were reviewed above. Constitutional, HEENT, cardiovascular, respiratory, GI, GU, musculoskeletal, neuro, psychiatric, endocrine, integumentary and hematologic systems were reviewed and are otherwise negative/unremarkable except for positive findings mentioned above in the HPI.   MEDICATIONS AT HOME:   Prior to Admission medications    Medication Sig Start Date End Date Taking? Authorizing Provider  albuterol (VENTOLIN HFA) 108 (90 Base) MCG/ACT inhaler Inhale 2 puffs into the lungs every 6 (six) hours as needed for wheezing or shortness of breath. 10/28/19   Loletha Grayer, MD  apixaban (ELIQUIS) 5 MG TABS tablet Take 1 tablet (5 mg total) by mouth 2 (two) times daily. 09/30/19   Jennye Boroughs, MD  cholecalciferol (VITAMIN D3) 25 MCG (1000 UNIT) tablet Take 1,000 Units by mouth daily.    [provider]  cholestyramine (QUESTRAN) 4 g packet Take 1 packet (4 g total) by mouth daily. in water 10/28/19   Loletha Grayer, MD  donepezil (ARICEPT) 10 MG tablet Take 10 mg by mouth daily.     [provider]  Ferrous Sulfate (SLOW FE) 142 (45 Fe) MG TBCR Take 1 tablet by mouth twice daily 11/19/19   [name], MD  gabapentin (NEURONTIN) 300 MG capsule Take 1-2 capsules (  300-600 mg total) by mouth 3 (three) times daily. Take 300 mg in the morning, 300 mg in the afternoon and 600 mg at night 01/30/20   Jennye Boroughs, MD  hydrocortisone (ANUSOL-HC) 25 MG suppository Place 1 suppository (25 mg total) rectally 2 (two) times daily. Okay to substitute generic 10/28/19   Loletha Grayer, MD  hydrOXYzine (ATARAX/VISTARIL) 25 MG tablet Take 25 mg by mouth 2 (two) times daily. 01/27/20   [provider]  insulin detemir (LEVEMIR) 100 UNIT/ML FlexPen Inject 5 Units into the skin every evening. 10/28/19   Wieting, Richard, MD  Insulin Pen Needle 31G X 8 MM MISC 1 Dose by Does not apply route at bedtime. 10/28/19   Loletha Grayer, MD  levothyroxine (SYNTHROID) 125 MCG tablet Take 125 mcg by mouth daily.  12/25/19   [provider]  LINZESS 72 MCG capsule Take 72 mcg by mouth every morning. 12/04/19   [provider]  liver oil-zinc oxide (DESITIN) 40 % ointment Apply topically 2 (two) times daily. Apply to buttock and groin ares 10/28/19   Loletha Grayer, MD  loratadine (CLARITIN) 10 MG tablet Take 10 mg by  mouth daily.     [provider]  meclizine (ANTIVERT) 12.5 MG tablet Take 1 tablet (12.5 mg total) by mouth 2 (two) times daily as needed for dizziness. 09/30/19   Jennye Boroughs, MD  metolazone (ZAROXOLYN) 5 MG tablet Take 1 tablet (5 mg total) by mouth daily. 05/08/20 08/06/20  Kate Sable, MD  metoprolol succinate (TOPROL-XL) 25 MG 24 hr tablet Take 0.5 tablets (12.5 mg total) by mouth at bedtime. 10/28/19   Loletha Grayer, MD  montelukast (SINGULAIR) 10 MG tablet Take 10 mg by mouth at bedtime.  07/23/19   [provider]  Multiple Vitamin (MULTIVITAMIN WITH MINERALS) TABS tablet Take 1 tablet by mouth daily.    [provider]  nitroGLYCERIN (NITROSTAT) 0.4 MG SL tablet Place 0.4 mg under the tongue every 5 (five) minutes as needed for chest pain.     [provider]  OLANZapine (ZYPREXA) 2.5 MG tablet Take 2.5 mg by mouth in the morning and at bedtime.     [provider]  omeprazole (PRILOSEC) 40 MG capsule Take 40 mg by mouth 2 (two) times daily. 10/22/18   [provider]  pioglitazone (ACTOS) 30 MG tablet Take 30 mg by mouth daily. 12/25/19   [provider]  potassium chloride SA (KLOR-CON) 20 MEQ tablet Take 2 tablets (40 mEq total) by mouth daily. 10/29/19   Loletha Grayer, MD  rosuvastatin (CRESTOR) 40 MG tablet Take 40 mg by mouth daily. 10/22/18   [provider]  sertraline (ZOLOFT) 100 MG tablet Take 100 mg by mouth 2 (two) times daily. 11/19/18 04/24/30  [provider]  silver sulfADIAZINE (SILVADENE) 1 % cream Apply 1 application topically daily. Apply to left heel wound  (please help her with dressing supplies to purchase.  I was thinking a 4x4 on the wound and then she could use a piece of a Kerlex to hold it in place so one Kerlex would last for multiple dressings)    [provider]  torsemide (DEMADEX) 20 MG tablet Take 2 tablets (40 mg total) by mouth 2 (two) times daily. 04/24/20    Kate Sable, MD  traMADol (ULTRAM) 50 MG tablet Take 1 tablet (50 mg total) by mouth every 8 (eight) hours as needed for moderate pain. 12/26/19   Johnn Hai, PA-C  traZODone (DESYREL) 100 MG tablet  Take 1 tablet (100 mg total) by mouth at bedtime. 10/28/19   Loletha Grayer, MD  vitamin B-12 (CYANOCOBALAMIN) 1000 MCG tablet Take 1,000 mcg by mouth daily.    [provider]  vitamin E 180 MG (400 UNITS) capsule Take 400 Units by mouth daily.    [provider]      VITAL SIGNS:  Blood pressure 117/70, pulse 62, temperature 98.2 F (36.8 C), temperature source Oral, resp. rate 17, height 5\' 6"  (1.676 m), weight 86.2 kg, SpO2 98 %.  PHYSICAL EXAMINATION:  Physical Exam  GENERAL:  80 y.o.-year-old Caucasian female patient lying in the bed with no acute distress.  EYES: Pupils equal, round, reactive to light and accommodation. No scleral icterus. Extraocular muscles intact.  HEENT: Head atraumatic, normocephalic. Oropharynx and nasopharynx clear.  NECK:  Supple, no jugular venous distention. No thyroid enlargement, no tenderness.  LUNGS: Normal breath sounds bilaterally, no wheezing, rales,rhonchi or crepitation. No use of accessory muscles of respiration.  CARDIOVASCULAR: Irregularly irregular rhythm, S1, S2 normal. No murmurs, rubs, or gallops.  ABDOMEN: Soft, nondistended, nontender. Bowel sounds present. No organomegaly or mass.  EXTREMITIES: No pedal edema, cyanosis, or clubbing.  NEUROLOGIC: Cranial nerves II through XII are intact. Muscle strength 5/5 in all extremities. Sensation intact. Gait not checked.  PSYCHIATRIC: The patient is alert and oriented x 3.  Normal affect and good eye contact. SKIN: No obvious rash, lesion, or ulcer.   LABORATORY PANEL:   CBC Recent Labs  Lab 05/15/20 1606  WBC 5.2  HGB 11.7*  HCT 36.1  PLT 209    ------------------------------------------------------------------------------------------------------------------  Chemistries  Recent Labs  Lab 05/15/20 1606 05/15/20 1643  NA 133*  --   K <2.0*  --   CL 77*  --   CO2 37*  --   GLUCOSE 251*  --   BUN 37*  --   CREATININE 2.43*  --   CALCIUM 9.4  --   MG  --  2.4  AST 166*  --   ALT 139*  --   ALKPHOS 96  --   BILITOT 0.9  --    ------------------------------------------------------------------------------------------------------------------  Cardiac Enzymes No results for input(s): TROPONINI in the last 168 hours. ------------------------------------------------------------------------------------------------------------------  RADIOLOGY:  CT ABDOMEN PELVIS WO CONTRAST  Result Date: 05/15/2020 CLINICAL DATA:  p/w gastrointestinal bleed. Abdominal abscess/infection suspected. History of rectal cancer. Status post radiation therapy. EXAM: CT ABDOMEN AND PELVIS WITHOUT CONTRAST TECHNIQUE: Multidetector CT imaging of the abdomen and pelvis was performed following the standard protocol without IV contrast. COMPARISON:  CT abdomen pelvis 01/28/2020 FINDINGS: Lower chest: Punctate metallic densities again noted within the left lung base. Partially visualized cardiac pacemaker. At least small volume hiatal hernia. Hepatobiliary: No focal liver abnormality. No gallstones, gallbladder wall thickening, or pericholecystic fluid. No biliary dilatation. Pancreas: No focal lesion. Normal pancreatic contour. No surrounding inflammatory changes. No main pancreatic ductal dilatation. Spleen: Normal in size without focal abnormality. Adrenals/Urinary Tract: No adrenal nodule bilaterally. No nephrolithiasis, no hydronephrosis, and no contour-deforming renal mass. No ureterolithiasis or hydroureter. The urinary bladder is unremarkable. Stomach/Bowel: Stomach is within normal limits. No evidence of bowel wall thickening or dilatation. Diffuse sigmoid  diverticulosis. Under-distension of the rectum with limited evaluation. Appendix appears normal. Vascular/Lymphatic: No abdominal aorta or iliac aneurysm. Mild to moderate atherosclerotic plaque of the aorta and its branches. No abdominal, pelvic, or inguinal lymphadenopathy. Reproductive: Status post hysterectomy. No adnexal masses. Other: No intraperitoneal free fluid. No intraperitoneal free gas. No organized fluid collection. Musculoskeletal: No  abdominal wall hernia or abnormality Diffusely decreased bone density. No suspicious lytic or blastic osseous lesions. No acute displaced fracture. Multilevel degenerative changes of the spine with grade 1 anterolisthesis of L4 on L5. Mild retrolisthesis of L1 on L2, L2 on L3, L3 on L4. partially visualized intramedullary nail fixation of the right femur. IMPRESSION: 1. Diffuse sigmoid diverticulosis with no acute diverticulitis. 2. At least small volume hiatal hernia. 3. No acute intra-abdominal or intrapelvic abnormality with limited evaluation on this noncontrast study. Under-distension of the rectum with limited evaluation in a patient with history of rectal cancer. 4.  Aortic Atherosclerosis (ICD10-I70.0). Electronically Signed   By: Iven Finn M.D.   On: 05/15/2020 18:40   CT Head Wo Contrast  Result Date: 05/15/2020 CLINICAL DATA:  Increased weakness and fall for the past 3 days. History of rectal cancer. EXAM: CT HEAD WITHOUT CONTRAST TECHNIQUE: Contiguous axial images were obtained from the base of the skull through the vertex without intravenous contrast. COMPARISON:  CT head 10/23/2019 FINDINGS: Brain: Cerebral ventricle sizes are concordant with the degree of cerebral volume loss. Patchy and confluent areas of decreased attenuation are noted throughout the deep and periventricular white matter of the cerebral hemispheres bilaterally, compatible with chronic microvascular ischemic disease. No evidence of large-territorial acute infarction. No  parenchymal hemorrhage. No mass lesion. No extra-axial collection. No mass effect or midline shift. No hydrocephalus. Basilar cisterns are patent. Vascular: No hyperdense vessel. Atherosclerotic calcifications are present within the cavernous internal carotid arteries. Skull: No acute fracture or focal lesion. Sinuses/Orbits: Paranasal sinuses and mastoid air cells are clear. The orbits are unremarkable. Other: None. IMPRESSION: No acute intracranial abnormality. Electronically Signed   By: Iven Finn M.D.   On: 05/15/2020 18:45      IMPRESSION AND PLAN:  Active Problems:   GI bleeding  1.  Acute kidney injury likely prerenal. -The patient will be admitted to a medical monitored bed. -Should be hydrated with IV normal saline with added potassium chloride. -We will continue monitoring her BMPs. -We will avoid nephrotoxins.  2.  Severe hypokalemia. -Potassium will be aggressively replaced and magnesium level, abnormal.  3.  Lower GI bleeding with elevated LFTs. -We will follow serial hemoglobins and hematocrits. -She will be hydrated as mentioned above. -This could be related to radiotherapy recently received for rectal cancer. -We will hold off her Eliquis. -We will hold off her statin therapy. -We will follow her coagulation profile. -GI consultation will be obtained. -I notified Dr. Marius Ditch about the patient.  5.  Generalized weakness and muscle twitches. -This likely secondary to #2, 3 and #1. -Management as above.  6.  Chronic atrial fibrillation. -We will continue amiodarone and hold off Eliquis for now.  7.  Dementia and depression. -We will continue Aricept, Zyprexa and Zoloft.  8.  Type 2 diabetes mellitus. -We will continue basal coverage while cutting down dose to half when she is n.p.o. -We will place on supplemental coverage with NovoLog.  9.  Coronary artery disease. -We will continue her as needed sublingual nitroglycerin.  10.  GERD. -We will continue  PPI therapy.  11.  Dyslipidemia. -We will hold off her statin therapy as mentioned above and follow LFTs.   DVT prophylaxis: SCDs.  Medical prophylaxis currently contraindicated due to GI bleeding. Code Status: full code. Family Communication:  The plan of care was discussed in details with the patient (and family). I answered all questions. The patient agreed to proceed with the above mentioned plan. Further management will  depend upon hospital course. Disposition Plan: Back to previous home environment Consults called: GI consultation to Dr. Marius Ditch. All the records are reviewed and case discussed with ED provider.  Status is: Inpatient  Remains inpatient appropriate because:Ongoing diagnostic testing needed not appropriate for outpatient work up, Unsafe d/c plan, IV treatments appropriate due to intensity of illness or inability to take PO and Inpatient level of care appropriate due to severity of illness   Dispo: The patient is from: Home              Anticipated d/c is to: Home              Patient currently is not medically stable to d/c.   Difficult to place patient No   TOTAL TIME TAKING CARE OF THIS PATIENT: 55 minutes.    Christel Mormon M.D on 05/15/2020 at 8:21 PM  [facility]   From 7 PM-7 AM, contact night-coverage [email or web address]  CC: Primary care physician; Martin Majestic, FNP

## 2020-05-15 NOTE — ED Triage Notes (Signed)
Reports arriving via ems from home, pt states that she has been having involuntary jerking for the past 2 weeks and is uncertain why, states that she called her dr but was unable to get in touch with them, pt states that the jerking was so bad this am she couldn't get out of bed and is why she called 911. Pt reports that she feels weak

## 2020-05-15 NOTE — ED Triage Notes (Signed)
Pt comes into the ED via EMS from home with c/o increased weakness and fall a lot over the past 3 weeks, states she fell today , denies any injury..  135/64 [id number]

## 2020-05-16 ENCOUNTER — Other Ambulatory Visit: Payer: Self-pay

## 2020-05-16 ENCOUNTER — Inpatient Hospital Stay: Payer: Medicare Other

## 2020-05-16 DIAGNOSIS — K625 Hemorrhage of anus and rectum: Secondary | ICD-10-CM | POA: Diagnosis not present

## 2020-05-16 DIAGNOSIS — N179 Acute kidney failure, unspecified: Secondary | ICD-10-CM | POA: Diagnosis present

## 2020-05-16 DIAGNOSIS — K922 Gastrointestinal hemorrhage, unspecified: Secondary | ICD-10-CM

## 2020-05-16 DIAGNOSIS — R7401 Elevation of levels of liver transaminase levels: Secondary | ICD-10-CM | POA: Diagnosis not present

## 2020-05-16 DIAGNOSIS — E876 Hypokalemia: Secondary | ICD-10-CM | POA: Diagnosis not present

## 2020-05-16 DIAGNOSIS — K627 Radiation proctitis: Principal | ICD-10-CM

## 2020-05-16 LAB — HEPATIC FUNCTION PANEL
ALT: 103 U/L — ABNORMAL HIGH (ref 0–44)
AST: 123 U/L — ABNORMAL HIGH (ref 15–41)
Albumin: 3.3 g/dL — ABNORMAL LOW (ref 3.5–5.0)
Alkaline Phosphatase: 66 U/L (ref 38–126)
Bilirubin, Direct: 0.2 mg/dL (ref 0.0–0.2)
Indirect Bilirubin: 0.4 mg/dL (ref 0.3–0.9)
Total Bilirubin: 0.6 mg/dL (ref 0.3–1.2)
Total Protein: 6.5 g/dL (ref 6.5–8.1)

## 2020-05-16 LAB — CBC
HCT: 31.2 % — ABNORMAL LOW (ref 36.0–46.0)
Hemoglobin: 9.8 g/dL — ABNORMAL LOW (ref 12.0–15.0)
MCH: 30.5 pg (ref 26.0–34.0)
MCHC: 31.4 g/dL (ref 30.0–36.0)
MCV: 97.2 fL (ref 80.0–100.0)
Platelets: 158 10*3/uL (ref 150–400)
RBC: 3.21 MIL/uL — ABNORMAL LOW (ref 3.87–5.11)
RDW: 15.8 % — ABNORMAL HIGH (ref 11.5–15.5)
WBC: 3.2 10*3/uL — ABNORMAL LOW (ref 4.0–10.5)
nRBC: 0 % (ref 0.0–0.2)

## 2020-05-16 LAB — URINALYSIS, COMPLETE (UACMP) WITH MICROSCOPIC
Bacteria, UA: NONE SEEN
Bilirubin Urine: NEGATIVE
Glucose, UA: NEGATIVE mg/dL
Hgb urine dipstick: NEGATIVE
Ketones, ur: NEGATIVE mg/dL
Leukocytes,Ua: NEGATIVE
Nitrite: NEGATIVE
Protein, ur: NEGATIVE mg/dL
Specific Gravity, Urine: 1.006 (ref 1.005–1.030)
pH: 5 (ref 5.0–8.0)

## 2020-05-16 LAB — HEMOGLOBIN AND HEMATOCRIT, BLOOD
HCT: 32.2 % — ABNORMAL LOW (ref 36.0–46.0)
HCT: 30.3 % — ABNORMAL LOW (ref 36.0–46.0)
HCT: 31.4 % — ABNORMAL LOW (ref 36.0–46.0)
HCT: 33.9 % — ABNORMAL LOW (ref 36.0–46.0)
Hemoglobin: 9.8 g/dL — ABNORMAL LOW (ref 12.0–15.0)
Hemoglobin: 9.8 g/dL — ABNORMAL LOW (ref 12.0–15.0)
Hemoglobin: 10.1 g/dL — ABNORMAL LOW (ref 12.0–15.0)
Hemoglobin: 10.6 g/dL — ABNORMAL LOW (ref 12.0–15.0)

## 2020-05-16 LAB — BASIC METABOLIC PANEL
Anion gap: 19 — ABNORMAL HIGH (ref 5–15)
BUN: 35 mg/dL — ABNORMAL HIGH (ref 8–23)
CO2: 29 mmol/L (ref 22–32)
Calcium: 8.7 mg/dL — ABNORMAL LOW (ref 8.9–10.3)
Chloride: 92 mmol/L — ABNORMAL LOW (ref 98–111)
Creatinine, Ser: 2.18 mg/dL — ABNORMAL HIGH (ref 0.44–1.00)
GFR, Estimated: 22 mL/min — ABNORMAL LOW (ref >60)
Glucose, Bld: 105 mg/dL — ABNORMAL HIGH (ref 70–99)
Potassium: 3 mmol/L — ABNORMAL LOW (ref 3.5–5.1)
Sodium: 140 mmol/L (ref 135–145)

## 2020-05-16 LAB — POTASSIUM: Potassium: 3.1 mmol/L — ABNORMAL LOW (ref 3.5–5.1)

## 2020-05-16 LAB — HEMOGLOBIN A1C
Hgb A1c MFr Bld: 7.7 % — ABNORMAL HIGH (ref 4.8–5.6)
Mean Plasma Glucose: 174.29 mg/dL

## 2020-05-16 LAB — GLUCOSE, CAPILLARY
Glucose-Capillary: 132 mg/dL — ABNORMAL HIGH (ref 70–99)
Glucose-Capillary: 84 mg/dL (ref 70–99)
Glucose-Capillary: 178 mg/dL — ABNORMAL HIGH (ref 70–99)
Glucose-Capillary: 71 mg/dL (ref 70–99)
Glucose-Capillary: 121 mg/dL — ABNORMAL HIGH (ref 70–99)

## 2020-05-16 MED ORDER — PANTOPRAZOLE SODIUM 40 MG PO TBEC
40.0000 mg | DELAYED_RELEASE_TABLET | Freq: Every day | ORAL | Status: DC
  Administered 2020-05-16 – 2020-05-21 (×5): 40 mg via ORAL
  Filled 2020-05-16 (×5): qty 1

## 2020-05-16 MED ORDER — MAGNESIUM CITRATE PO SOLN
1.0000 | Freq: Once | ORAL | Status: AC
  Administered 2020-05-16: 1 via ORAL
  Filled 2020-05-16: qty 296

## 2020-05-16 NOTE — TOC Initial Note (Addendum)
Transition of Care [facility]) - Initial/Assessment Note    Patient Details  Name: Kristi [name] MRN: [id number] Date of Birth: 23-Dec-1940  Transition of Care [facility]) CM/SW Contact:    Kerin Salen, RN Phone Number: 05/16/2020, 12:36 PM  Clinical Narrative:  Spoke with patient and daughter, Lamar Benes, [phone] at bedside. Patient alert and oriented x3, says she lives alone PCP Irene Pap, MD [facility] practice. Daughter shops and assist with cooking, however she can use the Microwave to prepare meals, says she is not allowed to use the stove. Have a cane, weelchair and walker with wheels, that she uses. Patient and daughter both agree to SNF rehab if recommended. Attending to put in PT/OT orders.Will continue to track for [facility] needs.                 Expected Discharge Plan: [place] Barriers to Discharge: Continued Medical Work up   Patient Goals and CMS Choice Patient states their goals for this hospitalization and ongoing recovery are:: To return home, will go to SNF is recommended.   Choice offered to / list presented to : NA  Expected Discharge Plan and Services Expected Discharge Plan: [place]   Discharge Planning Services: CM Consult Post Acute Care Choice: [place] Living arrangements for the past 2 months: Single Family Home                 DME Arranged: N/A DME Agency: NA       HH Arranged: NA HH Agency: NA        Prior Living Arrangements/Services Living arrangements for the past 2 months: Single Family Home Lives with:: Self Patient language and need for interpreter reviewed:: Yes Do you feel safe going back to the place where you live?: Yes      Need for Family Participation in Patient Care: Yes (Comment) Care giver support system in place?: Yes (comment)   Criminal Activity/Legal Involvement Pertinent to Current Situation/Hospitalization: No - Comment as needed  Activities of Daily Living Home  Assistive Devices/Equipment: None ADL Screening (condition at time of admission) Patient's cognitive ability adequate to safely complete daily activities?: Yes Is the patient deaf or have difficulty hearing?: No Does the patient have difficulty seeing, even when wearing glasses/contacts?: No Does the patient have difficulty concentrating, remembering, or making decisions?: No Patient able to express need for assistance with ADLs?: No Does the patient have difficulty dressing or bathing?: No Independently performs ADLs?: Yes (appropriate for developmental age) Does the patient have difficulty walking or climbing stairs?: Yes Weakness of Legs: Both Weakness of Arms/Hands: None  Permission Sought/Granted Permission sought to share information with : Case Manager Permission granted to share information with : No  Share Information with NAME: Daughter Lamar Benes           Emotional Assessment Appearance:: Appears stated age Attitude/Demeanor/Rapport: Engaged Affect (typically observed): Accepting Orientation: : Oriented to Self,Oriented to Place,Oriented to Situation Alcohol / Substance Use: Not Applicable Psych Involvement: No (comment)  Admission diagnosis:  Hypokalemia [E87.6] GI bleeding [K92.2] Parkinson disease ([place]) [G20] 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 GI bleeding 05/15/2020  . GI bleed 01/28/2020  . Rectal bleed 01/28/2020  . At risk for polypharmacy 01/28/2020  . Acute delirium   . HCAP (healthcare-associated pneumonia)   .  Persistent atrial fibrillation ([place])   . Hypotension   . Hyponatremia   . Lobar pneumonia ([place]) 10/23/2019  . Atrial flutter ([place])   . Dizziness   . Atrial fibrillation status post cardioversion ([place]) 09/27/2019  . CHF (congestive heart failure) ([place])  09/26/2019  . Acute on chronic diastolic heart failure ([place]) 09/25/2019  . Hypertensive urgency 09/25/2019  . Fluid overload 09/25/2019  . Excessive gas 05/14/2019  . Generalized bloating 05/14/2019  . Bright red blood per rectum 05/14/2019  . 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 Chest pain 11/20/2018  . Difficulty walking 11/19/2018  . Insomnia 11/15/2018  . Type 2 diabetes mellitus with hyperlipidemia ([place]) 11/15/2018  . Goals of care, counseling/discussion 02/06/2018  . 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 Rectal mass 01/05/2018  . Leg pain, bilateral 12/06/2017  . Lymphedema 07/10/2017  . 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 Stricture and stenosis of esophagus   . Dysphagia   . Syncope and collapse 04/11/2017  . Mild dementia ([place]) 01/24/2017  . Lumbar facet osteoarthritis (Bilateral) 01/17/2017  . Acute postoperative pain 01/17/2017  . Hypokalemia 12/24/2016  . Anxiety 12/07/2016  . 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 Cerebrovascular accident ([place]) 12/07/2016  . Chronic depression 12/07/2016  . Essential hypertension 12/07/2016  . Hypothyroidism 12/07/2016  . Mixed hyperlipidemia 12/07/2016  . Myocardial infarction ([place]) 12/07/2016  . TIA (transient ischemic attack) 11/17/2016  . Parkinson disease ([place]) 10/27/2016  . Tremor 10/27/2016  . Chronic knee pain (B) (R>L) 10/19/2016  . Ischemic chest pain ([place]) 09/29/2016  . Abnormal CT scan, lumbar spine 09/08/2016  . Lumbar foraminal stenosis (multilevel) 09/08/2016  . Fracture of clavicle 08/03/2016  . Polyneuropathy 05/17/2016  . Neurogenic pain 04/12/2016  . Chronic lower extremity pain (Bilateral) (R>L) 04/12/2016  . Chronic lower extremity radicular pain (Primary Source of Pain) (Bilateral) (R>L) 04/12/2016  . Lower extremity weakness 04/12/2016  . Chronic low back pain (Secondary source of pain)  (Bilateral) (R>L) 04/12/2016  . Chronic wrist pain (Left) (secondary to fracture) 04/12/2016  . Lumbar spondylosis 04/12/2016  . Chronic pain syndrome 04/11/2016  . Long term current use of opiate analgesic 04/11/2016  . Long term prescription opiate use 04/11/2016  . Opiate use 04/11/2016  . Long term prescription benzodiazepine use 04/11/2016  . Lumbar radiculopathy (Multilevel) (Bilateral) 05/07/2015  . Lumbar facet syndrome (Bilateral) (R>L) 08/21/2014  . 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 Sacroiliac joint dysfunction (Bilateral) 08/13/2014  . Frequent falls 06/04/2014  . Memory loss 06/04/2014  . Anxiety and depression 08/22/2013  . CAD (coronary artery disease) 08/22/2013  . Therapeutic opioid-induced constipation (OIC) 08/22/2013  . GERD (gastroesophageal reflux disease) 08/22/2013   PCP:  Martin Majestic, FNP Pharmacy:   [facility], [place] [address] Phone: [phone] Fax: [place], [place] - [address] [place] [address] Phone: [phone] Fax: [phone]  [place], [place] - [address] [place] [address] Phone: [phone] Fax: [phone]     Social Determinants of Health (SDOH) Interventions    Readmission Risk Interventions Readmission Risk Prevention Plan 05/16/2020 01/30/2020 10/24/2019  Transportation Screening Complete Complete Complete  Medication Review (RN Care Manager) Complete Complete Complete  PCP or Specialist appointment within 3-5 days of discharge  Complete Complete Complete  HRI or Home Care Consult Complete Complete -  SW Recovery Care/Counseling Consult Complete Complete Complete  Palliative Care Screening Not Applicable Not Applicable Not Applicable  Skilled Nursing Facility Complete Not Applicable Not  Applicable  Some recent data might be hidden

## 2020-05-16 NOTE — Consult Note (Addendum)
Cephas Darby, MD [address]  [place]  [place], [place] [address]  Main: [phone]  Fax: [phone] Pager: [phone]   Consultation  Referring Provider:     No ref. provider found Primary Care Physician:  Martin Majestic, FNP Primary Gastroenterologist:  Dr. Alice Reichert         Reason for Consultation:     Rectal bleeding  Date of Admission:  05/15/2020 Date of Consultation:  05/16/2020         HPI:   Kristi [name] is a 80 y.o. 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 Apparently, 2 weeks after this procedure, she has been noticing intermittent rectal bleeding. She presented again to the hospital yesterday due to 2 weeks history of weakness, found to have bright red blood per rectum, and per vagina, her hemoglobin was 11.7 yesterday evening, 9.8 today.  She also has AKI.  She has history of A. fib on Eliquis, last dose yesterday.  Patient denies any abdominal pain, nausea or vomiting. Patient does report rectal pain. She does have history of chronic back pain. Patient's daughter is bedside. She did not have any bowel movements today.   NSAIDs: None  Antiplts/Anticoagulants/Anti thrombotics: Eliquis for history of A. fib  GI Procedures: Colonoscopy in 01/2020 pancolonic diverticulosis Radiation proctitis, s/p APC  Past Medical History:  Diagnosis Date  . Anxiety   . Arthritis   . Broken arm    left FA 3/17  . Broken arm    left  . CAD (coronary artery disease)    s/p MI  . Cancer ([place])    uterine  . Cervical disc disorder   . Depression   . Diabetes mellitus without complication ([place])   . GERD (gastroesophageal reflux disease)   . Headache   . Hyperlipidemia   . Hypertension   . Hypothyroid   . Myocardial infarction ([place])    15 year ago  . Neck pain 06/04/2014   . Parkinson's disease ([place])   . Presence of permanent cardiac pacemaker   . Spinal stenosis     Past Surgical History:  Procedure Laterality Date  . ABDOMINAL HYSTERECTOMY     partial  . BREAST SURGERY    . CARDIOVERSION N/A 10/24/2019   Procedure: CARDIOVERSION;  Surgeon: Wellington Hampshire, MD;  Location: [facility] ORS;  Service: Cardiovascular;  Laterality: N/A;  . COLONOSCOPY    . COLONOSCOPY N/A 01/30/2020   Procedure: COLONOSCOPY;  Surgeon: Toledo, Benay Pike, MD;  Location: [facility] ENDOSCOPY;  Service: Gastroenterology;  Laterality: N/A;  . COLONOSCOPY WITH PROPOFOL N/A 11/29/2017   Procedure: COLONOSCOPY WITH PROPOFOL;  Surgeon: [name], MD;  Location[facility] ENDOSCOPY;  Service: Endoscopy;  Laterality: N/A;  . CORONARY ANGIOPLASTY     stent  . CORONARY ARTERY BYPASS GRAFT    . CORONARY STENT PLACEMENT    . ESOPHAGOGASTRODUODENOSCOPY (EGD) WITH PROPOFOL N/A 04/18/2017   Procedure: ESOPHAGOGASTRODUODENOSCOPY (EGD) WITH PROPOFOL;  Surgeon: [name], MD;  Location: [facility] ENDOSCOPY;  Service: Endoscopy;  Laterality: N/A;  . ESOPHAGOGASTRODUODENOSCOPY (EGD) WITH PROPOFOL N/A 11/29/2017   Procedure: ESOPHAGOGASTRODUODENOSCOPY (EGD) WITH PROPOFOL;  Surgeon: [name], MD;  Location: [facility] ENDOSCOPY;  Service: Endoscopy;  Laterality: N/A;  . INSERT / REPLACE / REMOVE PACEMAKER    . PPM GENERATOR CHANGEOUT N/A 09/05/2019   Procedure: PPM GENERATOR CHANGEOUT;  Surgeon: [name], MD;  Location: [place] CV LAB;  Service: Cardiovascular;  Laterality: N/A;  . s/p pacer insertion    . TRANSANAL EXCISION OF RECTAL MASS N/A 01/17/2018   Procedure: TRANSANAL EXCISION OF RECTAL POLYP;  Surgeon: [name], MD;  Location: [facility] ORS;  Service: General;  Laterality: N/A;    Prior to Admission medications   Medication Sig Start Date End Date Taking? Authorizing Provider  albuterol (VENTOLIN HFA) 108 (90 Base) MCG/ACT inhaler Inhale 2 puffs into the lungs every 6 (six) hours  as needed for wheezing or shortness of breath. 10/28/19   Loletha Grayer, MD  apixaban (ELIQUIS) 5 MG TABS tablet Take 1 tablet (5 mg total) by mouth 2 (two) times daily. 09/30/19   Jennye Boroughs, MD  cholecalciferol (VITAMIN D3) 25 MCG (1000 UNIT) tablet Take 1,000 Units by mouth daily.    [provider]  cholestyramine (QUESTRAN) 4 g packet Take 1 packet (4 g total) by mouth daily. in water 10/28/19   Loletha Grayer, MD  donepezil (ARICEPT) 10 MG tablet Take 10 mg by mouth daily.     [provider]  Ferrous Sulfate (SLOW FE) 142 (45 Fe) MG TBCR Take 1 tablet by mouth twice daily 11/19/19   [name], MD  gabapentin (NEURONTIN) 300 MG capsule Take 1-2 capsules (300-600 mg total) by mouth 3 (three) times daily. Take 300 mg in the morning, 300 mg in the afternoon and 600 mg at night 01/30/20   Jennye Boroughs, MD  hydrocortisone (ANUSOL-HC) 25 MG suppository Place 1 suppository (25 mg total) rectally 2 (two) times daily. Okay to substitute generic 10/28/19   Loletha Grayer, MD  hydrOXYzine (ATARAX/VISTARIL) 25 MG tablet Take 25 mg by mouth 2 (two) times daily. 01/27/20   [provider]  insulin detemir (LEVEMIR) 100 UNIT/ML FlexPen Inject 5 Units into the skin every evening. 10/28/19   Wieting, Richard, MD  Insulin Pen Needle 31G X 8 MM MISC 1 Dose by Does not apply route at bedtime. 10/28/19   Loletha Grayer, MD  levothyroxine (SYNTHROID) 125 MCG tablet Take 125 mcg by mouth daily.  12/25/19   [provider]  LINZESS 72 MCG capsule Take 72 mcg by mouth every morning. 12/04/19   [provider]  liver oil-zinc oxide (DESITIN) 40 % ointment Apply topically 2 (two) times daily. Apply to buttock and groin ares 10/28/19   Loletha Grayer, MD  loratadine (CLARITIN) 10 MG tablet Take 10 mg by mouth daily.     [provider]  meclizine (ANTIVERT) 12.5 MG tablet Take 1 tablet (12.5 mg total) by mouth 2 (two) times daily as needed for dizziness. 09/30/19    Jennye Boroughs, MD  metolazone (ZAROXOLYN) 5 MG tablet Take 1 tablet (5 mg total) by mouth daily. 05/08/20 08/06/20  Kate Sable, MD  metoprolol succinate (TOPROL-XL) 25 MG 24 hr tablet Take 0.5 tablets (12.5 mg total) by mouth at bedtime. 10/28/19   Loletha Grayer, MD  montelukast (SINGULAIR) 10 MG tablet Take 10 mg by mouth at bedtime.  07/23/19   [provider]  Multiple Vitamin (MULTIVITAMIN WITH MINERALS) TABS tablet Take 1 tablet by mouth daily.    [provider]  nitroGLYCERIN (NITROSTAT) 0.4 MG SL tablet Place 0.4 mg under the tongue every 5 (five) minutes as needed for chest pain.     [provider]  OLANZapine (ZYPREXA) 2.5 MG tablet Take 2.5 mg by mouth in the morning and at bedtime.  [provider]  omeprazole (PRILOSEC) 40 MG capsule Take 40 mg by mouth 2 (two) times daily. 10/22/18   [provider]  pioglitazone (ACTOS) 30 MG tablet Take 30 mg by mouth daily. 12/25/19   [provider]  potassium chloride SA (KLOR-CON) 20 MEQ tablet Take 2 tablets (40 mEq total) by mouth daily. 10/29/19   Loletha Grayer, MD  rosuvastatin (CRESTOR) 40 MG tablet Take 40 mg by mouth daily. 10/22/18   [provider]  sertraline (ZOLOFT) 100 MG tablet Take 100 mg by mouth 2 (two) times daily. 11/19/18 04/24/30  [provider]  silver sulfADIAZINE (SILVADENE) 1 % cream Apply 1 application topically daily. Apply to left heel wound  (please help her with dressing supplies to purchase.  I was thinking a 4x4 on the wound and then she could use a piece of a Kerlex to hold it in place so one Kerlex would last for multiple dressings)    [provider]  torsemide (DEMADEX) 20 MG tablet Take 2 tablets (40 mg total) by mouth 2 (two) times daily. 04/24/20   Kate Sable, MD  traMADol (ULTRAM) 50 MG tablet Take 1 tablet (50 mg total) by mouth every 8 (eight) hours as needed for moderate pain. 12/26/19   Johnn Hai, PA-C   traZODone (DESYREL) 100 MG tablet Take 1 tablet (100 mg total) by mouth at bedtime. 10/28/19   Loletha Grayer, MD  vitamin B-12 (CYANOCOBALAMIN) 1000 MCG tablet Take 1,000 mcg by mouth daily.    [provider]  vitamin E 180 MG (400 UNITS) capsule Take 400 Units by mouth daily.    [provider]    Current Facility-Administered Medications:  .  0.9 % NaCl with KCl 20 mEq/ L  infusion, , Intravenous, Continuous, Mansy, Jan A, MD, Last Rate: 100 mL/hr at 05/16/20 0958, New Bag at 05/16/20 0958 .  acetaminophen (TYLENOL) tablet 650 mg, 650 mg, Oral, Q6H PRN, 650 mg at 05/16/20 0942 **OR** acetaminophen (TYLENOL) suppository 650 mg, 650 mg, Rectal, Q6H PRN, Mansy, Jan A, MD .  albuterol (VENTOLIN HFA) 108 (90 Base) MCG/ACT inhaler 2 puff, 2 puff, Inhalation, Q6H PRN, Mansy, Jan A, MD .  cholecalciferol (VITAMIN D3) tablet 1,000 Units, 1,000 Units, Oral, Daily, Mansy, Arvella Merles, MD, 1,000 Units at 05/16/20 0945 .  donepezil (ARICEPT) tablet 10 mg, 10 mg, Oral, QHS, Mansy, Jan A, MD, 10 mg at 05/15/20 2203 .  ferrous sulfate tablet 325 mg, 325 mg, Oral, BID WC, Mansy, Jan A, MD .  gabapentin (NEURONTIN) capsule 300 mg, 300 mg, Oral, BID, Mansy, Jan A, MD, 300 mg at 05/16/20 0944 .  gabapentin (NEURONTIN) capsule 600 mg, 600 mg, Oral, QHS, Mansy, Jan A, MD, 600 mg at 05/15/20 2204 .  hydrOXYzine (ATARAX/VISTARIL) tablet 25 mg, 25 mg, Oral, BID, Mansy, Jan A, MD, 25 mg at 05/16/20 0945 .  insulin aspart (novoLOG) injection 0-9 Units, 0-9 Units, Subcutaneous, TID PC & HS, Mansy, Arvella Merles, MD, 2 Units at 05/15/20 2202 .  insulin detemir (LEVEMIR) injection 5 Units, 5 Units, Subcutaneous, QHS, Mansy, Arvella Merles, MD, 5 Units at 05/15/20 2202 .  levothyroxine (SYNTHROID) tablet 125 mcg, 125 mcg, Oral, Daily, Mansy, Jan A, MD, 125 mcg at 05/16/20 0620 .  linaclotide (LINZESS) capsule 72 mcg, 72 mcg, Oral, q morning, Mansy, Jan A, MD, 72 mcg at 05/16/20 0943 .  loratadine (CLARITIN) tablet 10 mg, 10  mg, Oral, Daily, Mansy, Jan A, MD, 10 mg at 05/16/20 0947 .  magnesium citrate solution 1 Bottle, 1 Bottle, Oral, Once, Jermesha Sottile, Tally Due, MD .  meclizine (ANTIVERT) tablet 12.5 mg, 12.5 mg, Oral, BID PRN, Mansy, Jan A, MD .  metoprolol succinate (TOPROL-XL) 24 hr tablet 12.5 mg, 12.5 mg, Oral, QHS, Mansy, Jan A, MD, 12.5 mg at 05/15/20 2205 .  montelukast (SINGULAIR) tablet 10 mg, 10 mg, Oral, QHS, Mansy, Jan A, MD, 10 mg at 05/15/20 2205 .  multivitamin with minerals tablet 1 tablet, 1 tablet, Oral, Daily, Mansy, Jan A, MD, 1 tablet at 05/16/20 0945 .  nitroGLYCERIN (NITROSTAT) SL tablet 0.4 mg, 0.4 mg, Sublingual, Q5 min PRN, Mansy, Jan A, MD .  OLANZapine [facility]) tablet 2.5 mg, 2.5 mg, Oral, BID, Mansy, Jan A, MD, 2.5 mg at 05/16/20 0947 .  ondansetron (ZOFRAN) tablet 4 mg, 4 mg, Oral, Q6H PRN **OR** ondansetron (ZOFRAN) injection 4 mg, 4 mg, Intravenous, Q6H PRN, Mansy, Jan A, MD .  potassium chloride SA (KLOR-CON) CR tablet 40 mEq, 40 mEq, Oral, Daily, Mansy, Jan A, MD, 40 mEq at 05/16/20 0944 .  sertraline (ZOLOFT) tablet 100 mg, 100 mg, Oral, BID, Mansy, Jan A, MD, 100 mg at 05/16/20 0944 .  traMADol (ULTRAM) tablet 50 mg, 50 mg, Oral, Q8H PRN, Mansy, Jan A, MD .  traZODone (DESYREL) tablet 100 mg, 100 mg, Oral, QHS, Mansy, Jan A, MD, 100 mg at 05/15/20 2207 .  traZODone (DESYREL) tablet 25 mg, 25 mg, Oral, QHS PRN, Mansy, Jan A, MD .  vitamin B-12 (CYANOCOBALAMIN) tablet 1,000 mcg, 1,000 mcg, Oral, Daily, Mansy, Jan A, MD, 1,000 mcg at 05/16/20 0945 .  vitamin E capsule 400 Units, 400 Units, Oral, Daily, Mansy, Arvella Merles, MD, 400 Units at 05/16/20 4825  Family History  Problem Relation Age of Onset  . Cancer Mother   . Depression Mother   . Diabetes Mother   . Hypertension Mother   . Aneurysm Mother   . Heart disease Father   . Diabetes Sister   . Diabetes Brother   . Diabetes Brother      Social History   Tobacco Use  . Smoking status: Never Smoker  . Smokeless tobacco:  Never Used  Vaping Use  . Vaping Use: Never used  Substance Use Topics  . Alcohol use: No  . Drug use: No    Allergies as of 05/15/2020 - Review Complete 05/15/2020  Allergen Reaction Noted  . Penicillins Anaphylaxis, Swelling, and Rash 08/21/2013    Review of Systems:    All systems reviewed and negative except where noted in HPI.   Physical Exam:  Vital signs in last 24 hours: Temp:  [96.2 F (35.7 C)-98.2 F (36.8 C)] 97.5 F (36.4 C) (02/26 1157) Pulse Rate:  [59-88] 88 (02/26 1157) Resp:  [15-20] 18 (02/26 0738) BP: (99-145)/(57-94) 145/94 (02/26 1157) SpO2:  [94 %-99 %] 99 % (02/26 1157) Weight:  [86.2 kg-87.7 kg] 87.7 kg (02/26 0323)   General:   Pleasant, cooperative in NAD Head:  Normocephalic and atraumatic. Eyes:   No icterus.   Conjunctiva pink. PERRLA. Ears:  Normal auditory acuity. Neck:  Supple; no masses or thyroidomegaly Lungs: Respirations even and unlabored. Lungs clear to auscultation bilaterally.   No wheezes, crackles, or rhonchi.  Heart:  Regular rate and rhythm;  Without murmur, clicks, rubs or gallops Abdomen:  Soft, nondistended, nontender. Normal bowel sounds. No appreciable masses or hepatomegaly.  No rebound or guarding.  Rectal:  Not performed. Msk:  Symmetrical without gross deformities.  Strength generalized weakness Extremities:  Without edema, cyanosis or clubbing. Neurologic:  Alert and oriented x3;  grossly normal neurologically. Skin:  Intact without significant lesions or rashes. Psych:  Alert and cooperative. Normal affect.  LAB RESULTS: CBC Latest Ref Rng & Units 05/16/2020 05/16/2020 05/16/2020  WBC 4.0 - 10.5 K/uL - 3.2(L) -  Hemoglobin 12.0 - 15.0 g/dL 9.8(L) 9.8(L) 9.8(L)  Hematocrit 36.0 - 46.0 % 30.3(L) 31.2(L) 32.2(L)  Platelets 150 - 400 K/uL - 158 -    BMET BMP Latest Ref Rng & Units 05/16/2020 05/15/2020 01/30/2020  Glucose 70 - 99 mg/dL 105(H) 251(H) -  BUN 8 - 23 mg/dL 35(H) 37(H) -  Creatinine 0.44 - 1.00 mg/dL  2.18(H) 2.43(H) -  BUN/Creat Ratio 12 - 28 - - -  Sodium 135 - 145 mmol/L 140 133(L) -  Potassium 3.5 - 5.1 mmol/L 3.0(L) <2.0(LL) 3.3(L)  Chloride 98 - 111 mmol/L 92(L) 77(L) -  CO2 22 - 32 mmol/L 29 37(H) -  Calcium 8.9 - 10.3 mg/dL 8.7(L) 9.4 -    LFT Hepatic Function Latest Ref Rng & Units 05/15/2020 01/29/2020 01/28/2020  Total Protein 6.5 - 8.1 g/dL 8.2(H) 5.9(L) 7.0  Albumin 3.5 - 5.0 g/dL 4.4 3.2(L) 3.7  AST 15 - 41 U/L 166(H) 23 28  ALT 0 - 44 U/L 139(H) 18 21  Alk Phosphatase 38 - 126 U/L 96 71 87  Total Bilirubin 0.3 - 1.2 mg/dL 0.9 0.5 0.6  Bilirubin, Direct 0.00 - 0.40 mg/dL - - -     STUDIES: CT ABDOMEN PELVIS WO CONTRAST  Result Date: 05/15/2020 CLINICAL DATA:  p/w gastrointestinal bleed. Abdominal abscess/infection suspected. History of rectal cancer. Status post radiation therapy. EXAM: CT ABDOMEN AND PELVIS WITHOUT CONTRAST TECHNIQUE: Multidetector CT imaging of the abdomen and pelvis was performed following the standard protocol without IV contrast. COMPARISON:  CT abdomen pelvis 01/28/2020 FINDINGS: Lower chest: Punctate metallic densities again noted within the left lung base. Partially visualized cardiac pacemaker. At least small volume hiatal hernia. Hepatobiliary: No focal liver abnormality. No gallstones, gallbladder wall thickening, or pericholecystic fluid. No biliary dilatation. Pancreas: No focal lesion. Normal pancreatic contour. No surrounding inflammatory changes. No main pancreatic ductal dilatation. Spleen: Normal in size without focal abnormality. Adrenals/Urinary Tract: No adrenal nodule bilaterally. No nephrolithiasis, no hydronephrosis, and no contour-deforming renal mass. No ureterolithiasis or hydroureter. The urinary bladder is unremarkable. Stomach/Bowel: Stomach is within normal limits. No evidence of bowel wall thickening or dilatation. Diffuse sigmoid diverticulosis. Under-distension of the rectum with limited evaluation. Appendix appears normal.  Vascular/Lymphatic: No abdominal aorta or iliac aneurysm. Mild to moderate atherosclerotic plaque of the aorta and its branches. No abdominal, pelvic, or inguinal lymphadenopathy. Reproductive: Status post hysterectomy. No adnexal masses. Other: No intraperitoneal free fluid. No intraperitoneal free gas. No organized fluid collection. Musculoskeletal: No abdominal wall hernia or abnormality Diffusely decreased bone density. No suspicious lytic or blastic osseous lesions. No acute displaced fracture. Multilevel degenerative changes of the spine with grade 1 anterolisthesis of L4 on L5. Mild retrolisthesis of L1 on L2, L2 on L3, L3 on L4. partially visualized intramedullary nail fixation of the right femur. IMPRESSION: 1. Diffuse sigmoid diverticulosis with no acute diverticulitis. 2. At least small volume hiatal hernia. 3. No acute intra-abdominal or intrapelvic abnormality with limited evaluation on this noncontrast study. Under-distension of the rectum with limited evaluation in a patient with history of rectal cancer. 4.  Aortic Atherosclerosis (ICD10-I70.0). Electronically Signed   By: Iven Finn M.D.   On: 05/15/2020 18:40   CT Head Wo  Contrast  Result Date: 05/15/2020 CLINICAL DATA:  Increased weakness and fall for the past 3 days. History of rectal cancer. EXAM: CT HEAD WITHOUT CONTRAST TECHNIQUE: Contiguous axial images were obtained from the base of the skull through the vertex without intravenous contrast. COMPARISON:  CT head 10/23/2019 FINDINGS: Brain: Cerebral ventricle sizes are concordant with the degree of cerebral volume loss. Patchy and confluent areas of decreased attenuation are noted throughout the deep and periventricular white matter of the cerebral hemispheres bilaterally, compatible with chronic microvascular ischemic disease. No evidence of large-territorial acute infarction. No parenchymal hemorrhage. No mass lesion. No extra-axial collection. No mass effect or midline shift. No  hydrocephalus. Basilar cisterns are patent. Vascular: No hyperdense vessel. Atherosclerotic calcifications are present within the cavernous internal carotid arteries. Skull: No acute fracture or focal lesion. Sinuses/Orbits: Paranasal sinuses and mastoid air cells are clear. The orbits are unremarkable. Other: None. IMPRESSION: No acute intracranial abnormality. Electronically Signed   By: Iven Finn M.D.   On: 05/15/2020 18:45      Impression / Plan:   Kristi [name] is a 80 y.o. female with history of coronary disease s/p PCI and CABG 15 years ago, history of A. fib on Eliquis, CHF, preserved EF, history of anal cancer s/p transanal excision in 2019, s/p radiation treatment, resulting in radiation proctitis complicated by intermittent rectal bleeding s/p APC in 11/21  Bright red blood per rectum secondary to radiation proctitis in setting of anticoagulation Continue to hold Xarelto Monitor CBC closely Can perform flexible sigmoidoscopy tomorrow Okay to start clear or full liquid diet magnesium citrate x 1 dose today Tapwater enema tomorrow morning N.p.o. effective 5am If patient has recurrent bleeding from radiation proctitis leading to chronic blood loss anemia and recurrent hospitalizations, recommend to discuss risks and benefits of continuation of long-term anticoagulation with cardiology   Elevated transaminases Recheck LFTs today Agree with right upper quadrant ultrasound SARS COVID-19 negative Check viral hepatitis panel  Thank you for involving me in the care of this patient.      LOS: 1 day   Sherri Sear, MD  05/16/2020, 12:13 PM   Note: This dictation was prepared with Dragon dictation along with smaller phrase technology. Any transcriptional errors that result from this process are unintentional.

## 2020-05-16 NOTE — Evaluation (Signed)
Occupational Therapy Evaluation Patient Details Name: Kristi [name] MRN: [id number] DOB: 23-Oct-1940 Today's Date: 05/16/2020    History of Present Illness Kristi [name] is an 73yoF who comes to [facility] 2/25 c worsening generalized weakness and recurrent falls over the last 3 weeks, also reports intermittent involuntary jerking movements over the last 2 weeks as well as bilateral leg cramps. Pt admitted with AKI, hypokalemia, GIB. PMH: GAD, OA, CAD, DM2, HTN, hypoTSH, PD, PPM.   Clinical Impression   Pt seen for OT evaluation this date in setting of acute hospitalization d/t hypokalemia. Pt reports being MOD I with fxl mobility in the home and requiring some assist for LB ADLs from spouse or daughter. In addition, dtr assists with groceries and transportation. Pt presents this date with some myoclonic jerking noted that impacts her mobility and ADLs in addition to some general weakness and deconditioning. This date, pt declines to get OOB with OT, but is noted to have requires MAX A for sup to sit per PT documentation. OT engages pt in self care at bed level using high-fowler's position and pt requires MIN A for UB ADLs, MAX A for LB ADLs. Pt's jerking motions noted to get slightly worse with some volitional movements that would be considered fatiguing such as holding UEs up for UE strength assessment. Pt educated re: role of OT, POC, and importance of OOB Activity to sustain strength. Pt with good understanding. Will continue to follow acutely. Anticipate pt will require f/u with skilled therapy in rehab setting (SNF) upon d/c.     Follow Up Recommendations  SNF    Equipment Recommendations  Other (comment) (defer to next level of care)    Recommendations for Other Services       Precautions / Restrictions Precautions Precautions: Fall Restrictions Weight Bearing Restrictions: No      Mobility Bed Mobility Overal bed mobility: Needs Assistance Bed Mobility: Supine to Sit;Sit to Supine      Supine to sit: Max assist Sit to supine: Max assist   General bed mobility comments: pt declines to sit EOB again with OT At this time stating she sat EOB with PT earlier    Transfers                 General transfer comment: deferred    Balance Overall balance assessment: History of Falls;Needs assistance Sitting-balance support: Bilateral upper extremity supported;Feet unsupported;Feet supported Sitting balance-Leahy Scale: Poor       Standing balance-Leahy Scale: Zero                 ADL either performed or assessed with clinical judgement   ADL Overall ADL's : Needs assistance/impaired             General ADL Comments: MIN A for UB ADLs, MAX A for LB ADLs     Vision Patient Visual Report: No change from baseline Additional Comments: difficult to formally assess given pt's jerking motions, but pt reports she notices no changes in vision, just difficulties keeping her eyes open for sustained period 2/2 jerking     Perception     Praxis      Pertinent Vitals/Pain Pain Assessment: No/denies pain     Hand Dominance Right   Extremity/Trunk Assessment Upper Extremity Assessment Upper Extremity Assessment: Generalized weakness (myclonic jerking noted that is slightly present at rest, but appears to worsen with intentional movements such as reaching arms up for UE assessment. ROM is grossly WFL, MMT difficult to formally assess given jerks.)  Lower Extremity Assessment Lower Extremity Assessment: Generalized weakness       Communication Communication Communication: HOH   Cognition Arousal/Alertness: Lethargic Behavior During Therapy: WFL for tasks assessed/performed Overall Cognitive Status: Difficult to assess                 General Comments: Pt is mostly appropriate, answers PLOF seemingly appropriately. She is oriented to month, year, self and place, but not day or date and states she doesn't know how long she's been here.    General Comments       Exercises Other Exercises Other Exercises: OT facilitates ed re: role of OT in acute setting. Pt with moderate reception.   Shoulder Instructions      Home Living Family/patient expects to be discharged to:: Private residence Living Arrangements: Alone Available Help at Discharge: Family (dtr assists wtih groceries and transportation) Type of Home: Mobile home Home Access: Ramped entrance     [place]: One level     Bathroom Shower/Tub: Teacher, early years/pre: Handicapped height Bathroom Accessibility: Yes   Home Equipment: Environmental consultant - 4 wheels;Toilet riser;Wheelchair - manual;Hospital bed          Prior Functioning/Environment Level of Independence: Needs assistance  Gait / Transfers Assistance Needed: RW used for household distances, does nto tolerate community distances ADL's / Homemaking Assistance Needed: DTR helps with grocereis and meals.   Comments: Pt reports istting for LBD on commode - unable to don socks at baseline.         OT Problem List: Decreased strength;Decreased range of motion;Decreased activity tolerance;Impaired balance (sitting and/or standing);Decreased coordination;Impaired tone;Impaired UE functional use      OT Treatment/Interventions: Self-care/ADL training;DME and/or AE instruction;Therapeutic activities;Balance training;Therapeutic exercise;Energy conservation;Patient/family education    OT Goals(Current goals can be found in the care plan section) Acute Rehab OT Goals Patient Stated Goal: wants to feel better, stop falling OT Goal Formulation: With patient Time For Goal Achievement: 05/30/20 Potential to Achieve Goals: Good ADL Goals Pt Will Perform Grooming: with set-up;with supervision;sitting (completing 1 g/h task EOB sitting x4 mins to increase fxl activity tolerance and MIN/MOD A support as needed d/t jerking.) Pt Will Transfer to Toilet: with min assist;with mod assist;stand pivot  transfer;bedside commode Pt/caregiver will Perform Home Exercise Program: Increased strength;Right Upper extremity;Increased ROM;Left upper extremity (mindful of L shoulder deficits.) Additional ADL Goal #1: pt will tolerate ~1 min stand with AD versus 6 min sitting while particiapting in fxl ADL versus theract to increase OOB tolerance.  OT Frequency: Min 2X/week   Barriers to D/C:            Co-evaluation              AM-PAC OT "6 Clicks" Daily Activity     Outcome Measure Help from another person eating meals?: A Little Help from another person taking care of personal grooming?: A Little Help from another person toileting, which includes using toliet, bedpan, or urinal?: A Lot Help from another person bathing (including washing, rinsing, drying)?: A Lot Help from another person to put on and taking off regular upper body clothing?: A Little Help from another person to put on and taking off regular lower body clothing?: A Lot 6 Click Score: 15   End of Session Equipment Utilized During Treatment: Gait belt;Rolling walker  Activity Tolerance: Patient tolerated treatment well Patient left: in bed;with call bell/phone within reach;with bed alarm set (CNA presenting to get BG)  OT Visit Diagnosis: Unsteadiness on feet (R26.81);Muscle  weakness (generalized) (M62.81);Other symptoms and signs involving the nervous system (R29.898)                Time: 8768-1157 OT Time Calculation (min): 29 min Charges:  OT General Charges $OT Visit: 1 Visit OT Evaluation $OT Eval Moderate Complexity: 1 Mod OT Treatments $Self Care/Home Management : 8-22 mins  Gerrianne Scale, MS, OTR/L ascom [phone] 05/16/20, 7:42 PM

## 2020-05-16 NOTE — Hospital Course (Signed)
Summary of HPI on admission: 80 y.o. Caucasian female with medical history significant for anxiety, osteoarthritis, coronary artery disease, type 2 diabetes mellitus, hypertension, dyslipidemia, hypothyroidism and Parkinson's disease and status post pacemaker, presented to the ED on 05/15/20 with worsening generalized weakness, recurrent falls over the last 3 weeks, rectal bleeding, leg cramping and involuntary jerking movements.  She called 911 due to inability to get out of  bed.  ED Course: Vitals stable, HR 59 Labs Na 133, Cl 77, K <2, AST 166, ALT 139, Tbili 8.2 Imaging: CT abd/pelvis with sigmoid diverticulosis w/o acute diverticulitis, small hiatal hernia, no acute abnormalities.  CT head negative for acute findings. Treated with K replacement, IV fluid bolus. Admitted to hospitalist service for further evaluation and management.  Hospital course to date:

## 2020-05-16 NOTE — Progress Notes (Signed)
PROGRESS NOTE    Kristi [name]   NTI:[id number]  DOB: January 20, 1941  PCP: Martin Majestic, FNP    DOA: 05/15/2020 LOS: 1   Brief Narrative   Summary of HPI on admission: 80 y.o. Caucasian female with medical history significant for anxiety, osteoarthritis, coronary artery disease, type 2 diabetes mellitus, hypertension, dyslipidemia, hypothyroidism and Parkinson's disease and status post pacemaker, presented to the ED on 05/15/20 with worsening generalized weakness, recurrent falls over the last 3 weeks, rectal bleeding, leg cramping and involuntary jerking movements.  She called 911 due to inability to get out of  bed.  ED Course: Vitals stable, HR 59 Labs Na 133, Cl 77, K <2, AST 166, ALT 139, Tbili 8.2 Imaging: CT abd/pelvis with sigmoid diverticulosis w/o acute diverticulitis, small hiatal hernia, no acute abnormalities.  CT head negative for acute findings. Treated with K replacement, IV fluid bolus. Admitted to hospitalist service for further evaluation and management.  Hospital course to date:     Assessment & Plan   Principal Problem:   Rectal bleed Active Problems:   Hypokalemia   AKI (acute kidney injury) ([place])   Transaminitis   Frequent falls   Essential hypertension   Type 2 diabetes mellitus with hyperlipidemia (HCC)   Atrial fibrillation status post cardioversion (HCC)   Lumbar radiculopathy (Multilevel) (Bilateral)   Anxiety and depression   CAD (coronary artery disease)   GERD (gastroesophageal reflux disease)   Chronic pain syndrome   Parkinson disease (HCC)   Hypothyroidism   Mixed hyperlipidemia   Rectal bleed /history of rectal cancer status post excision and radiation /history of radiation proctitis /history of intermittent rectal bleeding-most recent bleeding episode was November 2021. Acute on chronic anemia -due to blood loss as above --GI consulted --Continue full liquid diet today --Plan for flex sig tomorrow --Trend hemoglobin,  transfuse if less than 7.0 --Bowel prep per GI --N.p.o. after 5 AM  Hypokalemia -severe on admission less than 2.0.  Being replaced.  Recheck this potassium level afternoon and daily.  Further replacement as needed. --Reported history of recurrent/chronic hypokalemia --Patient reports compliance with her home potassium supplements  AKI -likely due to hypovolemia, however patient with urinary retention this morning.  Check renal ultrasound.  Bladder scans in and out cath for retention.  Discussed with nursing.  Transaminitis -right upper quadrant ultrasound pending.  Monitor CMP daily.  Check viral hepatitis panel.  Frequent falls -PT and OT evaluations.  Essential hypertension -continue metoprolol  Type 2 diabetes mellitus with hyperlipidemia -continued on home Levemir.  Hold Actos.  Sliding scale NovoLog.  Atrial fibrillation status post cardioversion -.  Hold Eliquis given bleeding.  Chronic diastolic CHF -last echo May 2021 with low normal EF 50 to 55%, mild LVH, indeterminate diastolic parameters, moderate pulmonary hypertension, severe left atrial enlargement.  Appears euvolemic and compensated. --Continue Toprol --Diuretics are held for now given AKI  CAD -no active chest pain.  Continue home metoprolol nitro.  GERD -continue PPI  ?  Asthma /allergic rhinitis-continue as needed albuterol, Claritin, Singulair  Chronic pain syndrome due to severe lumbar stenosis and radiculopathy.  Continue home tramadol  Parkinson disease /dementia /depression anxiety /insomnia-continue home medications: Aricept, Zyprexa, Zoloft, Vistaril, trazodone  Vertigo/dizziness -continue home meclizine as needed  Hypothyroidism -continue Synthroid  Mixed hyperlipidemia -hold statin given elevated LFTs  Obesity: Body mass index is 31.22 kg/m.  Complicates overall care and prognosis.  DVT prophylaxis: SCDs Start: 05/15/20 2010   Diet:  Diet Orders (From admission, onward)  Start     Ordered    05/16/20 1014  Diet full liquid Room service appropriate? Yes; Fluid consistency: Thin  Diet effective now       Question Answer Comment  Room service appropriate? Yes   Fluid consistency: Thin      05/16/20 1013            Code Status: Full Code    Subjective 05/16/20    Patient seen at bedside today with Minerva present.  Patient has been having severe jerking of her body including trunk and lower extremities.  Denies abdominal pain today.  Says her leg cramps are better since potassium is being replaced.  She still 7 in church though.  Reports headache she describes as pressure this morning.  Has not been able to void this morning despite getting IV fluids.   Disposition Plan & Communication   Status is: Inpatient  Inpatient appropriate due to severity of illness including severe electrolyte abnormalities, bleeding with acute anemia as above.  Ongoing diagnostic evaluation.  Dispo: The patient is from: Home              Anticipated d/c is to: Home versus SNF              Patient currently not medically stable for discharge   Difficult to place patient now   Family Communication: Family member present at bedside on rounds today   Consults, Procedures, Significant Events   Consultants:   Gastroenterology  Procedures:   Flex sigmoidoscopy is planned 2/27  Antimicrobials:  Anti-infectives (From admission, onward)   None        Micro    Objective   Vitals:   05/16/20 0738 05/16/20 0941 05/16/20 1157 05/16/20 1555  BP: 99/69  (!) 145/94 107/68  Pulse: 73  88   Resp: 18     Temp: (!) 97.5 F (36.4 C) (!) 97.4 F (36.3 C) (!) 97.5 F (36.4 C)   TempSrc: Oral  Oral   SpO2: 94%  99% 97%  Weight:      Height:        Intake/Output Summary (Last 24 hours) at 05/16/2020 1921 Last data filed at 05/16/2020 1800 Gross per 24 hour  Intake 2898.03 ml  Output 2900 ml  Net -1.97 ml   Filed Weights   05/15/20 1600 05/16/20 0323  Weight: 86.2 kg 87.7 kg     Physical Exam:  General exam: awake, alert, no acute distress Respiratory system: CTAB, diminished bases, no wheezes, rales or rhonchi, normal respiratory effort. Cardiovascular system: normal S1/S2, RRR, no JVD, murmurs, rubs, gallops, no peripheral edema.   Gastrointestinal system: soft, NT, ND, no HSM felt, +bowel sounds. Central nervous system: A&O x 3. normal speech, intermittent jerks of all extremities, with patient leaning forward in the bed full body jerking noted frequently Extremities: moves all, no edema, normal tone Skin: dry, intact, normal temperature, normal color Psychiatry: normal mood, congruent affect, judgement and insight appear normal  Labs   Data Reviewed: I have personally reviewed following labs and imaging studies  CBC: Recent Labs  Lab 05/15/20 1606 05/15/20 2109 05/16/20 0232 05/16/20 0449 05/16/20 0809 05/16/20 1418  WBC 5.2  --   --  3.2*  --   --   HGB 11.7* 10.5* 9.8* 9.8* 9.8* 10.1*  HCT 36.1 32.7* 32.2* 31.2* 30.3* 31.4*  MCV 94.5  --   --  97.2  --   --   PLT 209  --   --  158  --   --    Basic Metabolic Panel: Recent Labs  Lab 05/15/20 1606 05/15/20 1643 05/16/20 0449 05/16/20 0809  NA 133*  --  140  --   K <2.0*  --  3.0* 3.1*  CL 77*  --  92*  --   CO2 37*  --  29  --   GLUCOSE 251*  --  105*  --   BUN 37*  --  35*  --   CREATININE 2.43*  --  2.18*  --   CALCIUM 9.4  --  8.7*  --   MG  --  2.4  --   --    GFR: Estimated Creatinine Clearance: 22.6 mL/min (A) (by C-G formula based on SCr of 2.18 mg/dL (H)). Liver Function Tests: Recent Labs  Lab 05/15/20 1606 05/16/20 0809  AST 166* 123*  ALT 139* 103*  ALKPHOS 96 66  BILITOT 0.9 0.6  PROT 8.2* 6.5  ALBUMIN 4.4 3.3*   No results for input(s): LIPASE, AMYLASE in the last 168 hours. No results for input(s): AMMONIA in the last 168 hours. Coagulation Profile: Recent Labs  Lab 05/15/20 2109  INR 1.2   Cardiac Enzymes: No results for input(s): CKTOTAL, CKMB,  CKMBINDEX, TROPONINI in the last 168 hours. BNP (last 3 results) No results for input(s): PROBNP in the last 8760 hours. HbA1C: Recent Labs    05/15/20 1606  HGBA1C 7.7*   CBG: Recent Labs  Lab 05/15/20 2059 05/16/20 0346 05/16/20 0739 05/16/20 1158 05/16/20 1709  GLUCAP 195* 132* 84 178* 71   Lipid Profile: No results for input(s): CHOL, HDL, LDLCALC, TRIG, CHOLHDL, LDLDIRECT in the last 72 hours. Thyroid Function Tests: No results for input(s): TSH, T4TOTAL, FREET4, T3FREE, THYROIDAB in the last 72 hours. Anemia Panel: No results for input(s): VITAMINB12, FOLATE, FERRITIN, TIBC, IRON, RETICCTPCT in the last 72 hours. Sepsis Labs: No results for input(s): PROCALCITON, LATICACIDVEN in the last 168 hours.  Recent Results (from the past 240 hour(s))  Resp Panel by RT-PCR (Flu A&B, Covid) Nasopharyngeal Swab     Status: None   Collection Time: 05/15/20  3:12 PM   Specimen: Nasopharyngeal Swab; Nasopharyngeal(NP) swabs in vial transport medium  Result Value Ref Range Status   SARS Coronavirus 2 by RT PCR NEGATIVE NEGATIVE Final    Comment: (NOTE) SARS-CoV-2 target nucleic acids are NOT DETECTED.  The SARS-CoV-2 RNA is generally detectable in upper respiratory specimens during the acute phase of infection. The lowest concentration of SARS-CoV-2 viral copies this assay can detect is 138 copies/mL. A negative result does not preclude SARS-Cov-2 infection and should not be used as the sole basis for treatment or other patient management decisions. A negative result may occur with  improper specimen collection/handling, submission of specimen other than nasopharyngeal swab, presence of viral mutation(s) within the areas targeted by this assay, and inadequate number of viral copies(<138 copies/mL). A negative result must be combined with clinical observations, patient history, and epidemiological information. The expected result is Negative.  Fact Sheet for Patients:   EntrepreneurPulse.com.au  Fact Sheet for Healthcare Providers:  IncredibleEmployment.be  This test is no t yet approved or cleared by the [place] FDA and  has been authorized for detection and/or diagnosis of SARS-CoV-2 by FDA under an Emergency Use Authorization (EUA). This EUA will remain  in effect (meaning this test can be used) for the duration of the COVID-19 declaration under Section 564(b)(1) of the Act, 21 U.S.C.section 360bbb-3(b)(1), unless the authorization is terminated  or revoked sooner.       Influenza A by PCR NEGATIVE NEGATIVE Final   Influenza B by PCR NEGATIVE NEGATIVE Final    Comment: (NOTE) The Xpert Xpress SARS-CoV-2/FLU/RSV plus assay is intended as an aid in the diagnosis of influenza from Nasopharyngeal swab specimens and should not be used as a sole basis for treatment. Nasal washings and aspirates are unacceptable for Xpert Xpress SARS-CoV-2/FLU/RSV testing.  Fact Sheet for Patients: EntrepreneurPulse.com.au  Fact Sheet for Healthcare Providers: IncredibleEmployment.be  This test is not yet approved or cleared by the [place] FDA and has been authorized for detection and/or diagnosis of SARS-CoV-2 by FDA under an Emergency Use Authorization (EUA). This EUA will remain in effect (meaning this test can be used) for the duration of the COVID-19 declaration under Section 564(b)(1) of the Act, 21 U.S.C. section 360bbb-3(b)(1), unless the authorization is terminated or revoked.  Performed at [facility], [address], [place], [place] [address]       Imaging Studies   CT ABDOMEN PELVIS WO CONTRAST  Result Date: 05/15/2020 CLINICAL DATA:  p/w gastrointestinal bleed. Abdominal abscess/infection suspected. History of rectal cancer. Status post radiation therapy. EXAM: CT ABDOMEN AND PELVIS WITHOUT CONTRAST TECHNIQUE: Multidetector CT imaging of the  abdomen and pelvis was performed following the standard protocol without IV contrast. COMPARISON:  CT abdomen pelvis 01/28/2020 FINDINGS: Lower chest: Punctate metallic densities again noted within the left lung base. Partially visualized cardiac pacemaker. At least small volume hiatal hernia. Hepatobiliary: No focal liver abnormality. No gallstones, gallbladder wall thickening, or pericholecystic fluid. No biliary dilatation. Pancreas: No focal lesion. Normal pancreatic contour. No surrounding inflammatory changes. No main pancreatic ductal dilatation. Spleen: Normal in size without focal abnormality. Adrenals/Urinary Tract: No adrenal nodule bilaterally. No nephrolithiasis, no hydronephrosis, and no contour-deforming renal mass. No ureterolithiasis or hydroureter. The urinary bladder is unremarkable. Stomach/Bowel: Stomach is within normal limits. No evidence of bowel wall thickening or dilatation. Diffuse sigmoid diverticulosis. Under-distension of the rectum with limited evaluation. Appendix appears normal. Vascular/Lymphatic: No abdominal aorta or iliac aneurysm. Mild to moderate atherosclerotic plaque of the aorta and its branches. No abdominal, pelvic, or inguinal lymphadenopathy. Reproductive: Status post hysterectomy. No adnexal masses. Other: No intraperitoneal free fluid. No intraperitoneal free gas. No organized fluid collection. Musculoskeletal: No abdominal wall hernia or abnormality Diffusely decreased bone density. No suspicious lytic or blastic osseous lesions. No acute displaced fracture. Multilevel degenerative changes of the spine with grade 1 anterolisthesis of L4 on L5. Mild retrolisthesis of L1 on L2, L2 on L3, L3 on L4. partially visualized intramedullary nail fixation of the right femur. IMPRESSION: 1. Diffuse sigmoid diverticulosis with no acute diverticulitis. 2. At least small volume hiatal hernia. 3. No acute intra-abdominal or intrapelvic abnormality with limited evaluation on this  noncontrast study. Under-distension of the rectum with limited evaluation in a patient with history of rectal cancer. 4.  Aortic Atherosclerosis (ICD10-I70.0). Electronically Signed   By: Iven Finn M.D.   On: 05/15/2020 18:40   CT Head Wo Contrast  Result Date: 05/15/2020 CLINICAL DATA:  Increased weakness and fall for the past 3 days. History of rectal cancer. EXAM: CT HEAD WITHOUT CONTRAST TECHNIQUE: Contiguous axial images were obtained from the base of the skull through the vertex without intravenous contrast. COMPARISON:  CT head 10/23/2019 FINDINGS: Brain: Cerebral ventricle sizes are concordant with the degree of cerebral volume loss. Patchy and confluent areas of decreased attenuation are noted throughout the deep and periventricular white matter of the cerebral  hemispheres bilaterally, compatible with chronic microvascular ischemic disease. No evidence of large-territorial acute infarction. No parenchymal hemorrhage. No mass lesion. No extra-axial collection. No mass effect or midline shift. No hydrocephalus. Basilar cisterns are patent. Vascular: No hyperdense vessel. Atherosclerotic calcifications are present within the cavernous internal carotid arteries. Skull: No acute fracture or focal lesion. Sinuses/Orbits: Paranasal sinuses and mastoid air cells are clear. The orbits are unremarkable. Other: None. IMPRESSION: No acute intracranial abnormality. Electronically Signed   By: Iven Finn M.D.   On: 05/15/2020 18:45   US RENAL  Result Date: 05/16/2020 CLINICAL DATA:  Acute renal injury. EXAM: RENAL / URINARY TRACT ULTRASOUND COMPLETE COMPARISON:  CT abdomen pelvis 05/15/2020 FINDINGS: Right Kidney: Renal measurements: 8.7 x 3.9 x 4.8 cm = volume: 86 mL. Echogenicity possibly slightly increased. No mass or hydronephrosis visualized. Left Kidney: Renal measurements: 8.9 x 6 x 4.9 cm = volume: 138 mL. Echogenicity possibly slightly increased. No mass or hydronephrosis visualized. Urinary  bladder: Appears normal for degree of bladder distention. Other: None. IMPRESSION: 1. Slightly increased echogenicity of the renal parenchyma suggestive of renal parenchymal disease. 2. Otherwise unremarkable renal ultrasound. Electronically Signed   By: Iven Finn M.D.   On: 05/16/2020 16:34   US Abdomen Limited RUQ (LIVER/GB)  Result Date: 05/16/2020 CLINICAL DATA:  Transaminitis EXAM: ULTRASOUND ABDOMEN LIMITED RIGHT UPPER QUADRANT COMPARISON:  CT abdomen pelvis 05/15/2020 FINDINGS: Gallbladder: No gallstones or wall thickening visualized. No sonographic Murphy sign noted by sonographer. Common bile duct: Diameter: 5 mm Liver: No focal lesion identified. Normal parenchymal echogenicity. Portal vein is patent on color Doppler imaging with normal direction of blood flow towards the liver. Other: None. IMPRESSION: Unremarkable right upper quadrant ultrasound. Electronically Signed   By: Iven Finn M.D.   On: 05/16/2020 16:32     Medications   Scheduled Meds: . cholecalciferol  1,000 Units Oral Daily  . donepezil  10 mg Oral QHS  . ferrous sulfate  325 mg Oral BID WC  . gabapentin  300 mg Oral BID  . gabapentin  600 mg Oral QHS  . hydrOXYzine  25 mg Oral BID  . insulin aspart  0-9 Units Subcutaneous TID PC & HS  . insulin detemir  5 Units Subcutaneous QHS  . levothyroxine  125 mcg Oral Daily  . linaclotide  72 mcg Oral q morning  . loratadine  10 mg Oral Daily  . metoprolol succinate  12.5 mg Oral QHS  . montelukast  10 mg Oral QHS  . multivitamin with minerals  1 tablet Oral Daily  . OLANZapine  2.5 mg Oral BID  . potassium chloride SA  40 mEq Oral Daily  . sertraline  100 mg Oral BID  . traZODone  100 mg Oral QHS  . vitamin B-12  1,000 mcg Oral Daily  . vitamin E  400 Units Oral Daily   Continuous Infusions: . 0.9 % NaCl with KCl 20 mEq / L 100 mL/hr at 05/16/20 0958       LOS: 1 day    Time spent: 30 minutes    Ezekiel Slocumb, DO [facility]  05/16/2020, 7:21 PM      If 7PM-7AM, please contact night-coverage. How to contact the [facility] Attending or Consulting provider [place] or covering provider during after hours [place], for this patient?    1. Check the care team in [facility] and look for a) attending/consulting [facility] provider listed and b) the [facility] team listed 2. Log into [email or web address] and use  [facility] universal password to access. If you do not have the password, please contact the hospital operator. 3. Locate the [facility] provider you are looking for under [facility] and page to a number that you can be directly reached. 4. If you still have difficulty reaching the provider, please page the [facility] (Director on Call) for the Hospitalists listed on amion for assistance.

## 2020-05-16 NOTE — Evaluation (Signed)
Physical Therapy Evaluation Patient Details Name: Kristi [name] MRN: [id number] DOB: [birth year] Today's Date: 05/16/2020   History of Present Illness  Kristi [name] is an 56yoF who comes to [facility] 2/25 c worsening generalized weakness and recurrent falls over the last 3 weeks, also reports intermittent involuntary jerking movements over the last 2 weeks as well as bilateral leg cramps. Pt admitted with AKI, hypokalemia, GIB. PMH: GAD, OA, CAD, DM2, HTN, hypoTSH, PD, PPM.  Clinical Impression  Pt admitted with above diagnosis. Pt currently with functional limitations due to the deficits listed below (see "PT Problem List"). Upon entry, pt in bed, awake and agreeable to participate. The pt is alert, pleasant, interactive, and able to provide basic info regarding prior level of function, both in tolerance and independence. Does have a few instances of inappropriate response to question but HOH cannot be ruled out v mentation difficulty. Pt has mild intermittent jerking in the right facial muscles while supine, after MaxA to EOB, has total body myoclonic jerking for 2-3 minutes before weakness and nausea preclude further sitting. Will need to assess orthostatics next session, as following supine pressures much lower than earlier in day. Nausea resolves with return to supine. Patient's performance this date reveals decreased ability, independence, and tolerance in performing all basic mobility required for performance of activities of daily living. Pt requires additional DME, close physical assistance, and cues for safe participate in mobility. Pt will benefit from skilled PT intervention to increase independence and safety with basic mobility in preparation for discharge to the venue listed below.       Follow Up Recommendations SNF;Supervision for mobility/OOB    Equipment Recommendations  None recommended by PT    Recommendations for Other Services       Precautions / Restrictions  Precautions Precautions: Fall Restrictions Weight Bearing Restrictions: No      Mobility  Bed Mobility Overal bed mobility: Needs Assistance Bed Mobility: Supine to Sit;Sit to Supine     Supine to sit: Max assist Sit to supine: Max assist   General bed mobility comments: remains severely weak; typcially able to perform at baseline. sits at EOB x3 minutes, myoclonic jerking dominates pt's ability to sit with stability. Pt becomes more nauseated while sitting, returned to supine with subsequent BP much lower than 12:00 vitals.    Transfers                    Ambulation/Gait                Stairs            Wheelchair Mobility    Modified Rankin (Stroke Patients Only)       Balance Overall balance assessment: History of Falls;Needs assistance Sitting-balance support: Bilateral upper extremity supported;Feet unsupported;Feet supported Sitting balance-Leahy Scale: Poor       Standing balance-Leahy Scale: Zero                               Pertinent Vitals/Pain Pain Assessment: No/denies pain    Home Living Family/patient expects to be discharged to:: Private residence Living Arrangements: Alone Available Help at Discharge: Family (DTR assists with groceries, transportation) Type of Home: Mobile home Home Access: Ramped entrance     Home Layout: One level Home Equipment: Environmental consultant - 4 wheels;Toilet riser;Wheelchair - manual;Hospital bed      Prior Function Level of Independence: Needs assistance   Gait / Transfers Assistance Needed: RW  used for household distances, does nto tolerate community distances  ADL's / Homemaking Assistance Needed: DTR helps with grocereis and meals.        Hand Dominance   Dominant Hand: Right    Extremity/Trunk Assessment   Upper Extremity Assessment Upper Extremity Assessment: Generalized weakness    Lower Extremity Assessment Lower Extremity Assessment: Generalized weakness        Communication   Communication: HOH  Cognition Arousal/Alertness: Lethargic Behavior During Therapy: WFL for tasks assessed/performed Overall Cognitive Status: Difficult to assess                                        General Comments      Exercises     Assessment/Plan    PT Assessment Patient needs continued PT services  PT Problem List Decreased strength;Decreased activity tolerance;Decreased balance;Decreased mobility;Decreased coordination       PT Treatment Interventions DME instruction;Balance training;Gait training;Functional mobility training;Therapeutic activities;Therapeutic exercise;Patient/family education;Neuromuscular re-education    PT Goals (Current goals can be found in the Care Plan section)  Acute Rehab PT Goals Patient Stated Goal: wants to feel better, stop falling PT Goal Formulation: With patient Time For Goal Achievement: 05/30/20 Potential to Achieve Goals: Fair    Frequency Min 2X/week   Barriers to discharge Decreased caregiver support lives alone    Co-evaluation               AM-PAC PT "6 Clicks" Mobility  Outcome Measure Help needed turning from your back to your side while in a flat bed without using bedrails?: Total Help needed moving from lying on your back to sitting on the side of a flat bed without using bedrails?: Total Help needed moving to and from a bed to a chair (including a wheelchair)?: Total Help needed standing up from a chair using your arms (e.g., wheelchair or bedside chair)?: Total Help needed to walk in hospital room?: Total Help needed climbing 3-5 steps with a railing? : Total 6 Click Score: 6    End of Session Equipment Utilized During Treatment: Oxygen Activity Tolerance: Treatment limited secondary to medical complications (Comment) Patient left: in bed;with nursing/sitter in room;with call bell/phone within reach;with bed alarm set Nurse Communication: Mobility status (IV  occlusion) PT Visit Diagnosis: Difficulty in walking, not elsewhere classified (R26.2);Other abnormalities of gait and mobility (R26.89);Muscle weakness (generalized) (M62.81);Other symptoms and signs involving the nervous system (R29.898)    Time: 0093-8182 PT Time Calculation (min) (ACUTE ONLY): 15 min   Charges:   PT Evaluation $PT Eval Moderate Complexity: 1 Mod          4:08 PM, 05/16/20 Etta Grandchild, PT, DPT Physical Therapist - [facility]  [phone] (Colon)    [place] C 05/16/2020, 4:05 PM

## 2020-05-17 ENCOUNTER — Encounter
Admission: EM | Disposition: A | Discharge status: Skilled Nursing Facility | Payer: Medicare Other | Source: Home / Self Care | Attending: Internal Medicine

## 2020-05-17 ENCOUNTER — Encounter: Payer: Self-pay | Admitting: Family Medicine

## 2020-05-17 ENCOUNTER — Other Ambulatory Visit: Payer: Self-pay

## 2020-05-17 ENCOUNTER — Inpatient Hospital Stay: Payer: Medicare Other | Admitting: Certified Registered Nurse Anesthetist

## 2020-05-17 DIAGNOSIS — R278 Other lack of coordination: Secondary | ICD-10-CM | POA: Diagnosis not present

## 2020-05-17 DIAGNOSIS — E876 Hypokalemia: Secondary | ICD-10-CM | POA: Diagnosis not present

## 2020-05-17 DIAGNOSIS — K6289 Other specified diseases of anus and rectum: Secondary | ICD-10-CM

## 2020-05-17 DIAGNOSIS — K627 Radiation proctitis: Secondary | ICD-10-CM | POA: Diagnosis not present

## 2020-05-17 DIAGNOSIS — R296 Repeated falls: Secondary | ICD-10-CM

## 2020-05-17 DIAGNOSIS — R7401 Elevation of levels of liver transaminase levels: Secondary | ICD-10-CM | POA: Diagnosis not present

## 2020-05-17 DIAGNOSIS — K625 Hemorrhage of anus and rectum: Secondary | ICD-10-CM | POA: Diagnosis not present

## 2020-05-17 HISTORY — PX: FLEXIBLE SIGMOIDOSCOPY: SHX5431

## 2020-05-17 LAB — CBC
HCT: 31.4 % — ABNORMAL LOW (ref 36.0–46.0)
Hemoglobin: 10.6 g/dL — ABNORMAL LOW (ref 12.0–15.0)
MCH: 31.3 pg (ref 26.0–34.0)
MCHC: 33.8 g/dL (ref 30.0–36.0)
MCV: 92.6 fL (ref 80.0–100.0)
Platelets: 177 10*3/uL (ref 150–400)
RBC: 3.39 MIL/uL — ABNORMAL LOW (ref 3.87–5.11)
RDW: 15.7 % — ABNORMAL HIGH (ref 11.5–15.5)
WBC: 3.5 10*3/uL — ABNORMAL LOW (ref 4.0–10.5)
nRBC: 0 % (ref 0.0–0.2)

## 2020-05-17 LAB — COMPREHENSIVE METABOLIC PANEL
ALT: 119 U/L — ABNORMAL HIGH (ref 0–44)
AST: 145 U/L — ABNORMAL HIGH (ref 15–41)
Albumin: 3.1 g/dL — ABNORMAL LOW (ref 3.5–5.0)
Alkaline Phosphatase: 64 U/L (ref 38–126)
Anion gap: 11 (ref 5–15)
BUN: 28 mg/dL — ABNORMAL HIGH (ref 8–23)
CO2: 34 mmol/L — ABNORMAL HIGH (ref 22–32)
Calcium: 9.1 mg/dL (ref 8.9–10.3)
Chloride: 97 mmol/L — ABNORMAL LOW (ref 98–111)
Creatinine, Ser: 1.52 mg/dL — ABNORMAL HIGH (ref 0.44–1.00)
GFR, Estimated: 34 mL/min — ABNORMAL LOW (ref >60)
Glucose, Bld: 72 mg/dL (ref 70–99)
Potassium: 3.8 mmol/L (ref 3.5–5.1)
Sodium: 142 mmol/L (ref 135–145)
Total Bilirubin: 0.7 mg/dL (ref 0.3–1.2)
Total Protein: 5.9 g/dL — ABNORMAL LOW (ref 6.5–8.1)

## 2020-05-17 LAB — AMMONIA: Ammonia: 20 umol/L (ref 9–35)

## 2020-05-17 LAB — GLUCOSE, CAPILLARY
Glucose-Capillary: 85 mg/dL (ref 70–99)
Glucose-Capillary: 91 mg/dL (ref 70–99)
Glucose-Capillary: 92 mg/dL (ref 70–99)
Glucose-Capillary: 88 mg/dL (ref 70–99)
Glucose-Capillary: 151 mg/dL — ABNORMAL HIGH (ref 70–99)

## 2020-05-17 LAB — HEPATITIS PANEL, ACUTE
HCV Ab: NONREACTIVE
Hep A IgM: NONREACTIVE
Hep B C IgM: NONREACTIVE
Hepatitis B Surface Ag: NONREACTIVE

## 2020-05-17 LAB — IRON AND TIBC
Iron: 48 ug/dL (ref 28–170)
Saturation Ratios: 14 % (ref 10.4–31.8)
TIBC: 349 ug/dL (ref 250–450)
UIBC: 301 ug/dL

## 2020-05-17 LAB — MAGNESIUM: Magnesium: 2.5 mg/dL — ABNORMAL HIGH (ref 1.7–2.4)

## 2020-05-17 LAB — FERRITIN: Ferritin: 114 ng/mL (ref 11–307)

## 2020-05-17 SURGERY — SIGMOIDOSCOPY, FLEXIBLE
Anesthesia: General

## 2020-05-17 MED ORDER — EPHEDRINE SULFATE 50 MG/ML IJ SOLN
INTRAMUSCULAR | Status: DC | PRN
  Administered 2020-05-17 (×2): 10 mg via INTRAVENOUS

## 2020-05-17 MED ORDER — PHENYLEPHRINE HCL (PRESSORS) 10 MG/ML IV SOLN
INTRAVENOUS | Status: DC | PRN
  Administered 2020-05-17: 100 ug via INTRAVENOUS

## 2020-05-17 MED ORDER — LIDOCAINE HCL URETHRAL/MUCOSAL 2 % EX GEL
CUTANEOUS | Status: DC | PRN
  Administered 2020-05-17: 1

## 2020-05-17 MED ORDER — GLYCOPYRROLATE 0.2 MG/ML IJ SOLN
INTRAMUSCULAR | Status: DC | PRN
  Administered 2020-05-17: .2 mg via INTRAVENOUS

## 2020-05-17 MED ORDER — LIDOCAINE HCL (CARDIAC) PF 100 MG/5ML IV SOSY
PREFILLED_SYRINGE | INTRAVENOUS | Status: DC | PRN
  Administered 2020-05-17: 80 mg via INTRAVENOUS

## 2020-05-17 MED ORDER — PROPOFOL 10 MG/ML IV BOLUS
INTRAVENOUS | Status: DC | PRN
  Administered 2020-05-17 (×2): 10 mg via INTRAVENOUS
  Administered 2020-05-17: 30 mg via INTRAVENOUS

## 2020-05-17 MED ORDER — SODIUM CHLORIDE 0.9 % IV SOLN: INTRAVENOUS | Status: DC | PRN

## 2020-05-17 MED ORDER — LIDOCAINE HCL URETHRAL/MUCOSAL 2 % EX GEL
CUTANEOUS | Status: AC
  Filled 2020-05-17: qty 5

## 2020-05-17 MED ORDER — PROPOFOL 500 MG/50ML IV EMUL
INTRAVENOUS | Status: DC | PRN
  Administered 2020-05-17: 130 ug/kg/min via INTRAVENOUS

## 2020-05-17 NOTE — Consult Note (Signed)
NEURO HOSPITALIST CONSULT NOTE   Requestig physician: Dr. Arbutus Ped  Reason for Consult: Intermittent limb and body jerking  History obtained from:  Patient and Chart   HPI:                                                                                                                                          Kristi [name] is an 80 y.o. female with a PMHx of atrial fibrillation (on Eliquis) anxiety, osteoarthritis, dementia, depression, CAD, DM2, HTN, dyslipidemia, hypothyroidism and Parkinson's disease, status post pacemaker placement, who presented to the ED via EMS on 2/25 with increased weakness and falls over the past 3 weeks, in addition to involuntary jerking movements of her limbs and body over the same period. Her jerking was so severe on the morning of presentation that she could not get out of bed, requiring her to call 911. She also endorsed recent painful leg cramps. CBG was 333 on arrival.   The patient endorsed having had radiation treatment for rectal cancer a month ago. Rectal exam in the ED revealed BRBPR as well as a clot.   Labs revealed hypokalemia with potassium < 2. Also with mild hyponatremia at 133. Mg was 2.4. AST and ALT were elevated. Labs were also consistent with AKI.   EKG showed likely atrial fibrillation.   CT head was negative for acute intracranial abnormality.  She has been on potassium replacement since admission. She states that her jerking movements have gradually been getting better, but have not completely subsided. The jerking has occurred at rest, but is precipitated by movement.   Past Medical History:  Diagnosis Date  . Anxiety   . Arthritis   . Broken arm    left FA 3/17  . Broken arm    left  . CAD (coronary artery disease)    s/p MI  . Cancer ([place])    uterine  . Cervical disc disorder   . Depression   . Diabetes mellitus without complication ([place])   . GERD (gastroesophageal reflux disease)   . Headache   .  Hyperlipidemia   . Hypertension   . Hypothyroid   . Myocardial infarction ([place])    15 year ago  . Neck pain 06/04/2014  . Parkinson's disease ([place])   . Presence of permanent cardiac pacemaker   . Spinal stenosis     Past Surgical History:  Procedure Laterality Date  . ABDOMINAL HYSTERECTOMY     partial  . BREAST SURGERY    . CARDIOVERSION N/A 10/24/2019   Procedure: CARDIOVERSION;  Surgeon: Wellington Hampshire, MD;  Location: [facility] ORS;  Service: Cardiovascular;  Laterality: N/A;  . COLONOSCOPY    . COLONOSCOPY N/A 01/30/2020   Procedure: COLONOSCOPY;  Surgeon: Toledo, Benay Pike, MD;  Location: [facility] ENDOSCOPY;  Service: Gastroenterology;  Laterality: N/A;  . COLONOSCOPY WITH PROPOFOL N/A 11/29/2017   Procedure: COLONOSCOPY WITH PROPOFOL;  Surgeon: [name], MD;  Location: [facility] ENDOSCOPY;  Service: Endoscopy;  Laterality: N/A;  . CORONARY ANGIOPLASTY     stent  . CORONARY ARTERY BYPASS GRAFT    . CORONARY STENT PLACEMENT    . ESOPHAGOGASTRODUODENOSCOPY (EGD) WITH PROPOFOL N/A 04/18/2017   Procedure: ESOPHAGOGASTRODUODENOSCOPY (EGD) WITH PROPOFOL;  Surgeon: [name], MD;  Location: [facility] ENDOSCOPY;  Service: Endoscopy;  Laterality: N/A;  . ESOPHAGOGASTRODUODENOSCOPY (EGD) WITH PROPOFOL N/A 11/29/2017   Procedure: ESOPHAGOGASTRODUODENOSCOPY (EGD) WITH PROPOFOL;  Surgeon: [name], MD;  Location: [facility] ENDOSCOPY;  Service: Endoscopy;  Laterality: N/A;  . INSERT / REPLACE / REMOVE PACEMAKER    . PPM GENERATOR CHANGEOUT N/A 09/05/2019   Procedure: PPM GENERATOR CHANGEOUT;  Surgeon: [name], MD;  Location: [facility] LAB;  Service: Cardiovascular;  Laterality: N/A;  . s/p pacer insertion    . TRANSANAL EXCISION OF RECTAL MASS N/A 01/17/2018   Procedure: TRANSANAL EXCISION OF RECTAL POLYP;  Surgeon: [name], MD;  Location: [facility] ORS;  Service: General;  Laterality: N/A;    Family History  Problem Relation Age of Onset  . Cancer Mother   . Depression  Mother   . Diabetes Mother   . Hypertension Mother   . Aneurysm Mother   . Heart disease Father   . Diabetes Sister   . Diabetes Brother   . Diabetes Brother               Social History:  reports that she has never smoked. She has never used smokeless tobacco. She reports that she does not drink alcohol and does not use drugs.  Allergies  Allergen Reactions  . Penicillins Anaphylaxis, Swelling and Rash    TOLERATES CEFTRIAXONE    MEDICATIONS:                                                                                                                     No current facility-administered medications on file prior to encounter.   Current Outpatient Medications on File Prior to Encounter  Medication Sig Dispense Refill  . albuterol (VENTOLIN HFA) 108 (90 Base) MCG/ACT inhaler Inhale 2 puffs into the lungs every 6 (six) hours as needed for wheezing or shortness of breath. 8 g 0  . apixaban (ELIQUIS) 5 MG TABS tablet Take 1 tablet (5 mg total) by mouth 2 (two) times daily. 60 tablet 0  . cholecalciferol (VITAMIN D3) 25 MCG (1000 UNIT) tablet Take 1,000 Units by mouth daily.    . cholestyramine (QUESTRAN) 4 g packet Take 1 packet (4 g total) by mouth daily. in water    . donepezil (ARICEPT) 10 MG tablet Take 10 mg by mouth daily.     . Ferrous Sulfate (SLOW FE) 142 (45 Fe) MG TBCR Take 1 tablet by mouth twice daily    . gabapentin (NEURONTIN) 300 MG capsule Take 1-2 capsules (300-600 mg  total) by mouth 3 (three) times daily. Take 300 mg in the morning, 300 mg in the afternoon and 600 mg at night    . hydrocortisone (ANUSOL-HC) 25 MG suppository Place 1 suppository (25 mg total) rectally 2 (two) times daily. Okay to substitute generic 12 suppository 0  . hydrOXYzine (ATARAX/VISTARIL) 25 MG tablet Take 25 mg by mouth 2 (two) times daily.    . insulin detemir (LEVEMIR) 100 UNIT/ML FlexPen Inject 5 Units into the skin every evening. 15 mL 0  . Insulin Pen Needle 31G X 8 MM MISC 1 Dose by  Does not apply route at bedtime. 100 each 0  . levothyroxine (SYNTHROID) 125 MCG tablet Take 125 mcg by mouth daily.     [name] LINZESS 72 MCG capsule Take 72 mcg by mouth every morning.    . liver oil-zinc oxide (DESITIN) 40 % ointment Apply topically 2 (two) times daily. Apply to buttock and groin ares 56.7 g 0  . loratadine (CLARITIN) 10 MG tablet Take 10 mg by mouth daily.     . meclizine (ANTIVERT) 12.5 MG tablet Take 1 tablet (12.5 mg total) by mouth 2 (two) times daily as needed for dizziness. 30 tablet 0  . metolazone (ZAROXOLYN) 5 MG tablet Take 1 tablet (5 mg total) by mouth daily. 30 tablet 0  . metoprolol succinate (TOPROL-XL) 25 MG 24 hr tablet Take 0.5 tablets (12.5 mg total) by mouth at bedtime. 30 tablet 0  . montelukast (SINGULAIR) 10 MG tablet Take 10 mg by mouth at bedtime.     . Multiple Vitamin (MULTIVITAMIN WITH MINERALS) TABS tablet Take 1 tablet by mouth daily.    . nitroGLYCERIN (NITROSTAT) 0.4 MG SL tablet Place 0.4 mg under the tongue every 5 (five) minutes as needed for chest pain.     [name] OLANZapine (ZYPREXA) 2.5 MG tablet Take 2.5 mg by mouth in the morning and at bedtime.     [name] omeprazole (PRILOSEC) 40 MG capsule Take 40 mg by mouth 2 (two) times daily.    . pioglitazone (ACTOS) 30 MG tablet Take 30 mg by mouth daily.    . potassium chloride SA (KLOR-CON) 20 MEQ tablet Take 2 tablets (40 mEq total) by mouth daily. 60 tablet 0  . rosuvastatin (CRESTOR) 40 MG tablet Take 40 mg by mouth daily.    . sertraline (ZOLOFT) 100 MG tablet Take 100 mg by mouth 2 (two) times daily.    . silver sulfADIAZINE (SILVADENE) 1 % cream Apply 1 application topically daily. Apply to left heel wound  (please help her with dressing supplies to purchase.  I was thinking a 4x4 on the wound and then she could use a piece of a Kerlex to hold it in place so one Kerlex would last for multiple dressings)    . torsemide (DEMADEX) 20 MG tablet Take 2 tablets (40 mg total) by mouth 2 (two) times daily.     . traMADol (ULTRAM) 50 MG tablet Take 1 tablet (50 mg total) by mouth every 8 (eight) hours as needed for moderate pain. 15 tablet 0  . traZODone (DESYREL) 100 MG tablet Take 1 tablet (100 mg total) by mouth at bedtime. 30 tablet 0  . vitamin B-12 (CYANOCOBALAMIN) 1000 MCG tablet Take 1,000 mcg by mouth daily.    . vitamin E 180 MG (400 UNITS) capsule Take 400 Units by mouth daily.     Scheduled: . [MAR Hold] cholecalciferol  1,000 Units Oral Daily  . [MAR Hold] donepezil  10 mg  Oral QHS  . [MAR Hold] ferrous sulfate  325 mg Oral BID WC  . [MAR Hold] gabapentin  300 mg Oral BID  . [MAR Hold] gabapentin  600 mg Oral QHS  . [MAR Hold] hydrOXYzine  25 mg Oral BID  . [MAR Hold] insulin aspart  0-9 Units Subcutaneous TID PC & HS  . [MAR Hold] insulin detemir  5 Units Subcutaneous QHS  . [MAR Hold] levothyroxine  125 mcg Oral Daily  . lidocaine      . [MAR Hold] linaclotide  72 mcg Oral q morning  . [MAR Hold] loratadine  10 mg Oral Daily  . [MAR Hold] metoprolol succinate  12.5 mg Oral QHS  . [MAR Hold] montelukast  10 mg Oral QHS  . [MAR Hold] multivitamin with minerals  1 tablet Oral Daily  . [MAR Hold] OLANZapine  2.5 mg Oral BID  . [MAR Hold] pantoprazole  40 mg Oral Daily  . [MAR Hold] potassium chloride SA  40 mEq Oral Daily  . [MAR Hold] sertraline  100 mg Oral BID  . [MAR Hold] traZODone  100 mg Oral QHS  . [MAR Hold] vitamin B-12  1,000 mcg Oral Daily  . [MAR Hold] vitamin E  400 Units Oral Daily   Continuous: . 0.9 % NaCl with KCl 20 mEq / L 100 mL/hr at 05/17/20 0615     ROS:                                                                                                                                       As per HPI. Does not endorse additional complaints at the time of Neurology evaluation.    Blood pressure (!) 147/66, pulse 66, temperature (!) 96.4 F (35.8 C), resp. rate 19, height 5\' 6"  (1.676 m), weight 87.2 kg, SpO2 95 %.   General Examination:                                                                                                        Physical Exam  HEENT-  [place]/AT    Lungs- Respirations unlabored Extremities- Warm and well perfused  Neurological Examination Mental Status: Awake and alert. Fully oriented. Pleasant and cooperative. Speech fluent without evidence of aphasia.  Able to follow all commands without difficulty.  Cranial Nerves: II: Visual fields intact with no extinction to DSS. PERRL.   III,IV, VI: No ptosis. EOMI.   V,VII: Smile symmetric, facial temp sensation equal bilaterally VIII: Hearing intact to conversation  IX,X: No hypophonia XI: Symmetric XII: Midline tongue extension Motor: Right : Upper extremity   5/5    Left:     Upper extremity   5/5  Lower extremity   5/5     Lower extremity   5/5 No pronator drift.  At rest, no abnormal movements are noted.  With arms held antigravity, both positive and negative myoclonus is noted at irregular intervals in each arm, occurring non-rhythmically and non-synchronously. The myoclonus is most prominent in the wrists, but also includes the fingers and arms more proximally. No myoclonus involving the face, trunk or legs is appreciated. Sensory: Temp and light touch intact throughout, bilaterally Deep Tendon Reflexes: 2+ and symmetric throughout Plantars: Right: downgoing  Left: downgoing Cerebellar: No ataxia with FNF bilaterally. Myoclonus as described above.  Gait: Deferred   Lab Results: Basic Metabolic Panel: Recent Labs  Lab 05/15/20 1606 05/15/20 1643 05/16/20 0449 05/16/20 0809 05/17/20 0553  NA 133*  --  140  --  142  K <2.0*  --  3.0* 3.1* 3.8  CL 77*  --  92*  --  97*  CO2 37*  --  29  --  34*  GLUCOSE 251*  --  105*  --  72  BUN 37*  --  35*  --  28*  CREATININE 2.43*  --  2.18*  --  1.52*  CALCIUM 9.4  --  8.7*  --  9.1  MG  --  2.4  --   --  2.5*    CBC: Recent Labs  Lab 05/15/20 1606 05/15/20 2109 05/16/20 0449 05/16/20 0809 05/16/20 1418  05/16/20 2113 05/17/20 0553  WBC 5.2  --  3.2*  --   --   --  3.5*  HGB 11.7*   < > 9.8* 9.8* 10.1* 10.6* 10.6*  HCT 36.1   < > 31.2* 30.3* 31.4* 33.9* 31.4*  MCV 94.5  --  97.2  --   --   --  92.6  PLT 209  --  158  --   --   --  177   < > = values in this interval not displayed.    Cardiac Enzymes: No results for input(s): CKTOTAL, CKMB, CKMBINDEX, TROPONINI in the last 168 hours.  Lipid Panel: No results for input(s): CHOL, TRIG, HDL, CHOLHDL, VLDL, LDLCALC in the last 168 hours.  Imaging: CT ABDOMEN PELVIS WO CONTRAST  Result Date: 05/15/2020 CLINICAL DATA:  p/w gastrointestinal bleed. Abdominal abscess/infection suspected. History of rectal cancer. Status post radiation therapy. EXAM: CT ABDOMEN AND PELVIS WITHOUT CONTRAST TECHNIQUE: Multidetector CT imaging of the abdomen and pelvis was performed following the standard protocol without IV contrast. COMPARISON:  CT abdomen pelvis 01/28/2020 FINDINGS: Lower chest: Punctate metallic densities again noted within the left lung base. Partially visualized cardiac pacemaker. At least small volume hiatal hernia. Hepatobiliary: No focal liver abnormality. No gallstones, gallbladder wall thickening, or pericholecystic fluid. No biliary dilatation. Pancreas: No focal lesion. Normal pancreatic contour. No surrounding inflammatory changes. No main pancreatic ductal dilatation. Spleen: Normal in size without focal abnormality. Adrenals/Urinary Tract: No adrenal nodule bilaterally. No nephrolithiasis, no hydronephrosis, and no contour-deforming renal mass. No ureterolithiasis or hydroureter. The urinary bladder is unremarkable. Stomach/Bowel: Stomach is within normal limits. No evidence of bowel wall thickening or dilatation. Diffuse sigmoid diverticulosis. Under-distension of the rectum with limited evaluation. Appendix appears normal. Vascular/Lymphatic: No abdominal aorta or iliac aneurysm. Mild to moderate atherosclerotic plaque of the aorta and its  branches. No abdominal, pelvic, or inguinal  lymphadenopathy. Reproductive: Status post hysterectomy. No adnexal masses. Other: No intraperitoneal free fluid. No intraperitoneal free gas. No organized fluid collection. Musculoskeletal: No abdominal wall hernia or abnormality Diffusely decreased bone density. No suspicious lytic or blastic osseous lesions. No acute displaced fracture. Multilevel degenerative changes of the spine with grade 1 anterolisthesis of L4 on L5. Mild retrolisthesis of L1 on L2, L2 on L3, L3 on L4. partially visualized intramedullary nail fixation of the right femur. IMPRESSION: 1. Diffuse sigmoid diverticulosis with no acute diverticulitis. 2. At least small volume hiatal hernia. 3. No acute intra-abdominal or intrapelvic abnormality with limited evaluation on this noncontrast study. Under-distension of the rectum with limited evaluation in a patient with history of rectal cancer. 4.  Aortic Atherosclerosis (ICD10-I70.0). Electronically Signed   By: Iven Finn M.D.   On: 05/15/2020 18:40   CT Head Wo Contrast  Result Date: 05/15/2020 CLINICAL DATA:  Increased weakness and fall for the past 3 days. History of rectal cancer. EXAM: CT HEAD WITHOUT CONTRAST TECHNIQUE: Contiguous axial images were obtained from the base of the skull through the vertex without intravenous contrast. COMPARISON:  CT head 10/23/2019 FINDINGS: Brain: Cerebral ventricle sizes are concordant with the degree of cerebral volume loss. Patchy and confluent areas of decreased attenuation are noted throughout the deep and periventricular white matter of the cerebral hemispheres bilaterally, compatible with chronic microvascular ischemic disease. No evidence of large-territorial acute infarction. No parenchymal hemorrhage. No mass lesion. No extra-axial collection. No mass effect or midline shift. No hydrocephalus. Basilar cisterns are patent. Vascular: No hyperdense vessel. Atherosclerotic calcifications are present  within the cavernous internal carotid arteries. Skull: No acute fracture or focal lesion. Sinuses/Orbits: Paranasal sinuses and mastoid air cells are clear. The orbits are unremarkable. Other: None. IMPRESSION: No acute intracranial abnormality. Electronically Signed   By: Iven Finn M.D.   On: 05/15/2020 18:45   US RENAL  Result Date: 05/16/2020 CLINICAL DATA:  Acute renal injury. EXAM: RENAL / URINARY TRACT ULTRASOUND COMPLETE COMPARISON:  CT abdomen pelvis 05/15/2020 FINDINGS: Right Kidney: Renal measurements: 8.7 x 3.9 x 4.8 cm = volume: 86 mL. Echogenicity possibly slightly increased. No mass or hydronephrosis visualized. Left Kidney: Renal measurements: 8.9 x 6 x 4.9 cm = volume: 138 mL. Echogenicity possibly slightly increased. No mass or hydronephrosis visualized. Urinary bladder: Appears normal for degree of bladder distention. Other: None. IMPRESSION: 1. Slightly increased echogenicity of the renal parenchyma suggestive of renal parenchymal disease. 2. Otherwise unremarkable renal ultrasound. Electronically Signed   By: Iven Finn M.D.   On: 05/16/2020 16:34   US Abdomen Limited RUQ (LIVER/GB)  Result Date: 05/16/2020 CLINICAL DATA:  Transaminitis EXAM: ULTRASOUND ABDOMEN LIMITED RIGHT UPPER QUADRANT COMPARISON:  CT abdomen pelvis 05/15/2020 FINDINGS: Gallbladder: No gallstones or wall thickening visualized. No sonographic Murphy sign noted by sonographer. Common bile duct: Diameter: 5 mm Liver: No focal lesion identified. Normal parenchymal echogenicity. Portal vein is patent on color Doppler imaging with normal direction of blood flow towards the liver. Other: None. IMPRESSION: Unremarkable right upper quadrant ultrasound. Electronically Signed   By: Iven Finn M.D.   On: 05/16/2020 16:32    Assessment: 80 year old female with intermittent limb and body jerking.  1. Exam reveals both positive myoclonus as well as asterixis (negative myoclonus).   2. CT head:  No acute  intracranial abnormality. 3. Neurontin toxicity should be considered as a significant potential underlying etiology for her myoclonus/asterixis given her AKI, as Neurontin can build up to toxic levels in patients  with impaired renal function. Her LFTs are elevated, suggestive of hepatic dysfunction, which can also precipitate asterixis. Potassium depletion is another known cause of asterixis. Her myoclonus/asterixis may therefore be multifactorial from one or more of the above.  4. Dementia. Most likely mild based on the exam. She is on Aricept for this.  5. Excerpt from brief literature review (items pertinent to this patient in boldface): "Bilateral asterixis is most commonly associated with metabolic encephalopathies, especially hepatic. The presumption in hepatic encephalopathy was that damage to brain cells due to the impaired metabolism of ammonia was predominantly related to the development of asterixis in hepatic encephalopathy, however, new research shows that ammonia level does not directly correlate with the development of this phenomenon. Bilateral asterixis may also present in patients with cardiac and respiratory failure, uremia, electrolyte abnormalities (primarily hypoglycemia, hypokalemia, and hypomagnesemia), and drug intoxication. Phenytoin intoxication is the most commonly reported drug-induced asterixis, while other drugs implicated include benzodiazepines, barbiturates, valproate, gabapentin, carbamazepine, lithium, ceftazidime, and metoclopramide."  Recommendations: 1. Hold gabapentin and observe for possible improvement in her myoclonic jerks and asterixis.  2. Continue to manage her AKI 3. Continue to monitor and replace her potassium.  4. For stroke prevention in the setting of chronic atrial fibrillation, she is on Eliquis. Bleeding management per primary team.  5. Has CAD with stents. Would curbside Cardiology to determine if she needs to be on ASA long term in addition to her  Eliquis. This may be problematic given her radiation proctitis with bleeding.  6. Ammonia level (ordered) 7. Discontinue Ultram, as this medication can lower the seizure threshold.  8. Neurohospitalist service will sign off. Please call if there are additional questions.    Electronically signed: Dr. Kerney Elbe 05/17/2020, 2:51 PM

## 2020-05-17 NOTE — Anesthesia Preprocedure Evaluation (Signed)
Anesthesia Evaluation  Patient identified by MRN, date of birth, ID band Patient awake    Reviewed: Allergy & Precautions, NPO status , Patient's Chart, lab work & pertinent test results  Airway Mallampati: II   Neck ROM: Full    Dental no notable dental hx.    Pulmonary pneumonia,    Pulmonary exam normal breath sounds clear to auscultation       Cardiovascular hypertension, + CAD, + Past MI, + Peripheral Vascular Disease and +CHF  Normal cardiovascular exam+ pacemaker  Rhythm:Regular Rate:Normal     Neuro/Psych  Headaches, PSYCHIATRIC DISORDERS Anxiety Depression Dementia TIA Neuromuscular disease CVA    GI/Hepatic Neg liver ROS, GERD  ,  Endo/Other  diabetesHypothyroidism   Renal/GU Renal disease  negative genitourinary   Musculoskeletal  (+) Arthritis ,   Abdominal   Peds negative pediatric ROS (+)  Hematology negative hematology ROS (+)   Anesthesia Other Findings .Marland KitchenPast Medical History: No date: Anxiety No date: Arthritis No date: Broken arm     Comment:  left FA 3/17 No date: Broken arm     Comment:  left No date: CAD (coronary artery disease)     Comment:  s/p MI No date: Cancer ([place])     Comment:  uterine No date: Cervical disc disorder No date: Depression No date: Diabetes mellitus without complication ([facility]) No date: GERD (gastroesophageal reflux disease) No date: Headache No date: Hyperlipidemia No date: Hypertension No date: Hypothyroid No date: Myocardial infarction [facility])     Comment:  15 year ago 06/04/2014: Neck pain No date: Parkinson's disease ([place]) No date: Presence of permanent cardiac pacemaker No date: Spinal stenosis   Reproductive/Obstetrics negative OB ROS                             Anesthesia Physical  Anesthesia Plan  ASA: III  Anesthesia Plan: General   Post-op Pain Management:    Induction: Intravenous  PONV Risk Score and Plan: 3 and  Propofol infusion  Airway Management Planned: Nasal Cannula  Additional Equipment: None  Intra-op Plan:   Post-operative Plan:   Informed Consent: I have reviewed the patients History and Physical, chart, labs and discussed the procedure including the risks, benefits and alternatives for the proposed anesthesia with the patient or authorized representative who has indicated his/her understanding and acceptance.       Plan Discussed with: CRNA, Anesthesiologist and Surgeon  Anesthesia Plan Comments:         Anesthesia Quick Evaluation

## 2020-05-17 NOTE — Anesthesia Procedure Notes (Signed)
Procedure Name: MAC Date/Time: 05/17/2020 2:47 PM Performed by: Lily Peer, Deyanira Fesler, CRNA Pre-anesthesia Checklist: Emergency Drugs available, Patient identified, Suction available and Patient being monitored Patient Re-evaluated:Patient Re-evaluated prior to induction Oxygen Delivery Method: Simple face mask Induction Type: IV induction

## 2020-05-17 NOTE — Progress Notes (Signed)
PROGRESS NOTE    Kristi [name]   NWG:[id number]  DOB: 1940-07-22  PCP: Martin Majestic, FNP    DOA: 05/15/2020 LOS: 2   Brief Narrative   Summary of HPI on admission: 80 y.o. Caucasian female with medical history significant for anxiety, osteoarthritis, coronary artery disease, type 2 diabetes mellitus, hypertension, dyslipidemia, hypothyroidism and Parkinson's disease and status post pacemaker, presented to the ED on 05/15/20 with worsening generalized weakness, recurrent falls over the last 3 weeks, rectal bleeding, leg cramping and involuntary jerking movements.  She called 911 due to inability to get out of  bed.  ED Course: Vitals stable, HR 59 Labs Na 133, Cl 77, K <2, AST 166, ALT 139, Tbili 8.2 Imaging: CT abd/pelvis with sigmoid diverticulosis w/o acute diverticulitis, small hiatal hernia, no acute abnormalities.  CT head negative for acute findings. Treated with K replacement, IV fluid bolus. Admitted to hospitalist service for further evaluation and management.  Hospital course to date:     Assessment & Plan   Principal Problem:   Rectal bleed Active Problems:   Hypokalemia   AKI (acute kidney injury) ([place])   Transaminitis   Frequent falls   Essential hypertension   Type 2 diabetes mellitus with hyperlipidemia (HCC)   Atrial fibrillation status post cardioversion (HCC)   Lumbar radiculopathy (Multilevel) (Bilateral)   Anxiety and depression   CAD (coronary artery disease)   GERD (gastroesophageal reflux disease)   Chronic pain syndrome   Parkinson disease (HCC)   Hypothyroidism   Mixed hyperlipidemia   Rectal bleed /history of rectal cancer status post excision and radiation /history of radiation proctitis /history of intermittent rectal bleeding-most recent bleeding episode was November 2021. Acute on chronic anemia -due to blood loss as above --GI consulted --Continue full liquid diet today --Plan for flex sig tomorrow --Trend hemoglobin,  transfuse if less than 7.0 --Bowel prep per GI --N.p.o. after 5 AM  Hypokalemia -severe on admission less than 2.0.  Being replaced.  Recheck this potassium level afternoon and daily.  Further replacement as needed. --Reported history of recurrent/chronic hypokalemia --Patient reports compliance with her home potassium supplements  Myoclonus and asterixis -likely multifactorial.  Neurology consulted, assistance appreciated.  Follow-up recommendations.  AKI -likely due to hypovolemia, however patient with urinary retention this morning.  Check renal ultrasound.  Bladder scans in and out cath for retention.  Discussed with nursing.  Transaminitis -right upper quadrant ultrasound pending.  Monitor CMP daily.  Check viral hepatitis panel.  Frequent falls -PT and OT evaluations.  Essential hypertension -continue metoprolol  Type 2 diabetes mellitus with hyperlipidemia -continued on home Levemir.  Hold Actos.  Sliding scale NovoLog.  Atrial fibrillation status post cardioversion -.  Hold Eliquis given bleeding.  Chronic diastolic CHF -last echo May 2021 with low normal EF 50 to 55%, mild LVH, indeterminate diastolic parameters, moderate pulmonary hypertension, severe left atrial enlargement.  Appears euvolemic and compensated. --Continue Toprol --Diuretics are held for now given AKI  CAD -no active chest pain.  Continue home metoprolol nitro.  GERD -continue PPI  ?  Asthma /allergic rhinitis-continue as needed albuterol, Claritin, Singulair  Chronic pain syndrome due to severe lumbar stenosis and radiculopathy.  Continue home tramadol  Parkinson disease /dementia /depression anxiety /insomnia-continue home medications: Aricept, Zyprexa, Zoloft, Vistaril, trazodone  Vertigo/dizziness -continue home meclizine as needed  Hypothyroidism -continue Synthroid  Mixed hyperlipidemia -hold statin given elevated LFTs  Obesity: Body mass index is 31.02 kg/m.  Complicates overall care and  prognosis.  DVT prophylaxis: SCDs  Start: 05/15/20 2010   Diet:  Diet Orders (From admission, onward)    Start     Ordered   05/17/20 1519  Diet heart healthy/carb modified Room service appropriate? Yes; Fluid consistency: Thin  Diet effective now       Question Answer Comment  Diet-HS Snack? Nothing   Room service appropriate? Yes   Fluid consistency: Thin      05/17/20 1518            Code Status: Full Code    Subjective 05/17/20    Patient seen before procedure today.  Reports she just got an enema.  She denies any history of her body jerking before recently.  It was causing falls at home and prompted her to come to the hospital due to inability to get out of bed and she had to call EMS.  She has no other acute complaints today.  Disposition Plan & Communication   Status is: Inpatient  Inpatient appropriate due to severity of illness including severe electrolyte abnormalities, bleeding with acute anemia as above.  Ongoing diagnostic evaluation.  Dispo: The patient is from: Home              Anticipated d/c is to: Home versus SNF              Patient currently not medically stable for discharge   Difficult to place patient now   Family Communication: None at bedside on rounds today, will attempt to call   Consults, Procedures, Significant Events   Consultants:   Gastroenterology  Procedures:   Flex sigmoidoscopy is planned 2/27  Antimicrobials:  Anti-infectives (From admission, onward)   None        Micro    Objective   Vitals:   05/17/20 1524 05/17/20 1534 05/17/20 1549 05/17/20 1625  BP: (!) 142/53 (!) 136/56 136/60 (!) 141/63  Pulse: 69 60 60 73  Resp: 16 19 19 16   Temp:    97.8 F (36.6 C)  TempSrc:    Oral  SpO2: 97% 98% 95% 98%  Weight:      Height:        Intake/Output Summary (Last 24 hours) at 05/17/2020 1706 Last data filed at 05/17/2020 1612 Gross per 24 hour  Intake 3081.67 ml  Output --  Net 3081.67 ml   Filed Weights    05/15/20 1600 05/16/20 0323 05/17/20 0327  Weight: 86.2 kg 87.7 kg 87.2 kg    Physical Exam:  General exam: awake, alert, no acute distress Respiratory system: CTAB, diminished bases, normal respiratory effort. Cardiovascular system: normal S1/S2, RRR, no JVD, murmurs, rubs, gallops, no peripheral edema.   Gastrointestinal system: soft, NT, ND, no HSM felt, +bowel sounds. Central nervous system: A&O x 3. normal speech, witnessed intermittent frequent jerking of the left shoulder while patient at rest laying in bed, asterixis present Extremities: moves all, no edema, normal tone   Labs   Data Reviewed: I have personally reviewed following labs and imaging studies  CBC: Recent Labs  Lab 05/15/20 1606 05/15/20 2109 05/16/20 0449 05/16/20 0809 05/16/20 1418 05/16/20 2113 05/17/20 0553  WBC 5.2  --  3.2*  --   --   --  3.5*  HGB 11.7*   < > 9.8* 9.8* 10.1* 10.6* 10.6*  HCT 36.1   < > 31.2* 30.3* 31.4* 33.9* 31.4*  MCV 94.5  --  97.2  --   --   --  92.6  PLT 209  --  158  --   --   --  177   < > = values in this interval not displayed.   Basic Metabolic Panel: Recent Labs  Lab 05/15/20 1606 05/15/20 1643 05/16/20 0449 05/16/20 0809 05/17/20 0553  NA 133*  --  140  --  142  K <2.0*  --  3.0* 3.1* 3.8  CL 77*  --  92*  --  97*  CO2 37*  --  29  --  34*  GLUCOSE 251*  --  105*  --  72  BUN 37*  --  35*  --  28*  CREATININE 2.43*  --  2.18*  --  1.52*  CALCIUM 9.4  --  8.7*  --  9.1  MG  --  2.4  --   --  2.5*   GFR: Estimated Creatinine Clearance: 32.3 mL/min (A) (by C-G formula based on SCr of 1.52 mg/dL (H)). Liver Function Tests: Recent Labs  Lab 05/15/20 1606 05/16/20 0809 05/17/20 0553  AST 166* 123* 145*  ALT 139* 103* 119*  ALKPHOS 96 66 64  BILITOT 0.9 0.6 0.7  PROT 8.2* 6.5 5.9*  ALBUMIN 4.4 3.3* 3.1*   No results for input(s): LIPASE, AMYLASE in the last 168 hours. No results for input(s): AMMONIA in the last 168 hours. Coagulation  Profile: Recent Labs  Lab 05/15/20 2109  INR 1.2   Cardiac Enzymes: No results for input(s): CKTOTAL, CKMB, CKMBINDEX, TROPONINI in the last 168 hours. BNP (last 3 results) No results for input(s): PROBNP in the last 8760 hours. HbA1C: Recent Labs    05/15/20 1606  HGBA1C 7.7*   CBG: Recent Labs  Lab 05/16/20 2009 05/17/20 0812 05/17/20 1118 05/17/20 1423 05/17/20 1625  GLUCAP 121* 85 91 92 88   Lipid Profile: No results for input(s): CHOL, HDL, LDLCALC, TRIG, CHOLHDL, LDLDIRECT in the last 72 hours. Thyroid Function Tests: No results for input(s): TSH, T4TOTAL, FREET4, T3FREE, THYROIDAB in the last 72 hours. Anemia Panel: No results for input(s): VITAMINB12, FOLATE, FERRITIN, TIBC, IRON, RETICCTPCT in the last 72 hours. Sepsis Labs: No results for input(s): PROCALCITON, LATICACIDVEN in the last 168 hours.  Recent Results (from the past 240 hour(s))  Resp Panel by RT-PCR (Flu A&B, Covid) Nasopharyngeal Swab     Status: None   Collection Time: 05/15/20  3:12 PM   Specimen: Nasopharyngeal Swab; Nasopharyngeal(NP) swabs in vial transport medium  Result Value Ref Range Status   SARS Coronavirus 2 by RT PCR NEGATIVE NEGATIVE Final    Comment: (NOTE) SARS-CoV-2 target nucleic acids are NOT DETECTED.  The SARS-CoV-2 RNA is generally detectable in upper respiratory specimens during the acute phase of infection. The lowest concentration of SARS-CoV-2 viral copies this assay can detect is 138 copies/mL. A negative result does not preclude SARS-Cov-2 infection and should not be used as the sole basis for treatment or other patient management decisions. A negative result may occur with  improper specimen collection/handling, submission of specimen other than nasopharyngeal swab, presence of viral mutation(s) within the areas targeted by this assay, and inadequate number of viral copies(<138 copies/mL). A negative result must be combined with clinical observations, patient  history, and epidemiological information. The expected result is Negative.  Fact Sheet for Patients:  EntrepreneurPulse.com.au  Fact Sheet for Healthcare Providers:  IncredibleEmployment.be  This test is no t yet approved or cleared by the [place] FDA and  has been authorized for detection and/or diagnosis of SARS-CoV-2 by FDA under an Emergency Use Authorization (EUA). This EUA will remain  in effect (meaning this test  can be used) for the duration of the COVID-19 declaration under Section 564(b)(1) of the Act, 21 U.S.C.section 360bbb-3(b)(1), unless the authorization is terminated  or revoked sooner.       Influenza A by PCR NEGATIVE NEGATIVE Final   Influenza B by PCR NEGATIVE NEGATIVE Final    Comment: (NOTE) The Xpert Xpress SARS-CoV-2/FLU/RSV plus assay is intended as an aid in the diagnosis of influenza from Nasopharyngeal swab specimens and should not be used as a sole basis for treatment. Nasal washings and aspirates are unacceptable for Xpert Xpress SARS-CoV-2/FLU/RSV testing.  Fact Sheet for Patients: EntrepreneurPulse.com.au  Fact Sheet for Healthcare Providers: IncredibleEmployment.be  This test is not yet approved or cleared by the [place] FDA and has been authorized for detection and/or diagnosis of SARS-CoV-2 by FDA under an Emergency Use Authorization (EUA). This EUA will remain in effect (meaning this test can be used) for the duration of the COVID-19 declaration under Section 564(b)(1) of the Act, 21 U.S.C. section 360bbb-3(b)(1), unless the authorization is terminated or revoked.  Performed at [facility], [address] [place], [place] [address]       Imaging Studies   CT ABDOMEN PELVIS WO CONTRAST  Result Date: 05/15/2020 CLINICAL DATA:  p/w gastrointestinal bleed. Abdominal abscess/infection suspected. History of rectal cancer. Status post radiation  therapy. EXAM: CT ABDOMEN AND PELVIS WITHOUT CONTRAST TECHNIQUE: Multidetector CT imaging of the abdomen and pelvis was performed following the standard protocol without IV contrast. COMPARISON:  CT abdomen pelvis 01/28/2020 FINDINGS: Lower chest: Punctate metallic densities again noted within the left lung base. Partially visualized cardiac pacemaker. At least small volume hiatal hernia. Hepatobiliary: No focal liver abnormality. No gallstones, gallbladder wall thickening, or pericholecystic fluid. No biliary dilatation. Pancreas: No focal lesion. Normal pancreatic contour. No surrounding inflammatory changes. No main pancreatic ductal dilatation. Spleen: Normal in size without focal abnormality. Adrenals/Urinary Tract: No adrenal nodule bilaterally. No nephrolithiasis, no hydronephrosis, and no contour-deforming renal mass. No ureterolithiasis or hydroureter. The urinary bladder is unremarkable. Stomach/Bowel: Stomach is within normal limits. No evidence of bowel wall thickening or dilatation. Diffuse sigmoid diverticulosis. Under-distension of the rectum with limited evaluation. Appendix appears normal. Vascular/Lymphatic: No abdominal aorta or iliac aneurysm. Mild to moderate atherosclerotic plaque of the aorta and its branches. No abdominal, pelvic, or inguinal lymphadenopathy. Reproductive: Status post hysterectomy. No adnexal masses. Other: No intraperitoneal free fluid. No intraperitoneal free gas. No organized fluid collection. Musculoskeletal: No abdominal wall hernia or abnormality Diffusely decreased bone density. No suspicious lytic or blastic osseous lesions. No acute displaced fracture. Multilevel degenerative changes of the spine with grade 1 anterolisthesis of L4 on L5. Mild retrolisthesis of L1 on L2, L2 on L3, L3 on L4. partially visualized intramedullary nail fixation of the right femur. IMPRESSION: 1. Diffuse sigmoid diverticulosis with no acute diverticulitis. 2. At least small volume hiatal  hernia. 3. No acute intra-abdominal or intrapelvic abnormality with limited evaluation on this noncontrast study. Under-distension of the rectum with limited evaluation in a patient with history of rectal cancer. 4.  Aortic Atherosclerosis (ICD10-I70.0). Electronically Signed   By: Iven Finn M.D.   On: 05/15/2020 18:40   CT Head Wo Contrast  Result Date: 05/15/2020 CLINICAL DATA:  Increased weakness and fall for the past 3 days. History of rectal cancer. EXAM: CT HEAD WITHOUT CONTRAST TECHNIQUE: Contiguous axial images were obtained from the base of the skull through the vertex without intravenous contrast. COMPARISON:  CT head 10/23/2019 FINDINGS: Brain: Cerebral ventricle sizes are concordant with  the degree of cerebral volume loss. Patchy and confluent areas of decreased attenuation are noted throughout the deep and periventricular white matter of the cerebral hemispheres bilaterally, compatible with chronic microvascular ischemic disease. No evidence of large-territorial acute infarction. No parenchymal hemorrhage. No mass lesion. No extra-axial collection. No mass effect or midline shift. No hydrocephalus. Basilar cisterns are patent. Vascular: No hyperdense vessel. Atherosclerotic calcifications are present within the cavernous internal carotid arteries. Skull: No acute fracture or focal lesion. Sinuses/Orbits: Paranasal sinuses and mastoid air cells are clear. The orbits are unremarkable. Other: None. IMPRESSION: No acute intracranial abnormality. Electronically Signed   By: Iven Finn M.D.   On: 05/15/2020 18:45   US RENAL  Result Date: 05/16/2020 CLINICAL DATA:  Acute renal injury. EXAM: RENAL / URINARY TRACT ULTRASOUND COMPLETE COMPARISON:  CT abdomen pelvis 05/15/2020 FINDINGS: Right Kidney: Renal measurements: 8.7 x 3.9 x 4.8 cm = volume: 86 mL. Echogenicity possibly slightly increased. No mass or hydronephrosis visualized. Left Kidney: Renal measurements: 8.9 x 6 x 4.9 cm = volume:  138 mL. Echogenicity possibly slightly increased. No mass or hydronephrosis visualized. Urinary bladder: Appears normal for degree of bladder distention. Other: None. IMPRESSION: 1. Slightly increased echogenicity of the renal parenchyma suggestive of renal parenchymal disease. 2. Otherwise unremarkable renal ultrasound. Electronically Signed   By: Iven Finn M.D.   On: 05/16/2020 16:34   US Abdomen Limited RUQ (LIVER/GB)  Result Date: 05/16/2020 CLINICAL DATA:  Transaminitis EXAM: ULTRASOUND ABDOMEN LIMITED RIGHT UPPER QUADRANT COMPARISON:  CT abdomen pelvis 05/15/2020 FINDINGS: Gallbladder: No gallstones or wall thickening visualized. No sonographic Murphy sign noted by sonographer. Common bile duct: Diameter: 5 mm Liver: No focal lesion identified. Normal parenchymal echogenicity. Portal vein is patent on color Doppler imaging with normal direction of blood flow towards the liver. Other: None. IMPRESSION: Unremarkable right upper quadrant ultrasound. Electronically Signed   By: Iven Finn M.D.   On: 05/16/2020 16:32     Medications   Scheduled Meds: . cholecalciferol  1,000 Units Oral Daily  . donepezil  10 mg Oral QHS  . ferrous sulfate  325 mg Oral BID WC  . gabapentin  300 mg Oral BID  . gabapentin  600 mg Oral QHS  . hydrOXYzine  25 mg Oral BID  . insulin aspart  0-9 Units Subcutaneous TID PC & HS  . insulin detemir  5 Units Subcutaneous QHS  . levothyroxine  125 mcg Oral Daily  . lidocaine      . linaclotide  72 mcg Oral q morning  . loratadine  10 mg Oral Daily  . metoprolol succinate  12.5 mg Oral QHS  . montelukast  10 mg Oral QHS  . multivitamin with minerals  1 tablet Oral Daily  . OLANZapine  2.5 mg Oral BID  . pantoprazole  40 mg Oral Daily  . potassium chloride SA  40 mEq Oral Daily  . sertraline  100 mg Oral BID  . traZODone  100 mg Oral QHS  . vitamin B-12  1,000 mcg Oral Daily  . vitamin E  400 Units Oral Daily   Continuous Infusions: . 0.9 % NaCl with  KCl 20 mEq / L 100 mL/hr at 05/17/20 1336       LOS: 2 days    Time spent: 30 minutes with greater than 50% spent at bedside and in coordination of care.    Ezekiel Slocumb, DO [facility]  05/17/2020, 5:06 PM      If 7PM-7AM, please contact night-coverage. How  to contact the [facility] Attending or Consulting provider [place] or covering provider during after hours [place], for this patient?    1. Check the care team in [facility] and look for a) attending/consulting [facility] provider listed and b) the [facility] team listed 2. Log into [email or web address] and use [place]'s universal password to access. If you do not have the password, please contact the hospital operator. 3. Locate the [facility] provider you are looking for under [facility] and page to a number that you can be directly reached. 4. If you still have difficulty reaching the provider, please page the [facility] (Director on Call) for the Hospitalists listed on amion for assistance.

## 2020-05-17 NOTE — Transfer of Care (Signed)
Immediate Anesthesia Transfer of Care Note  [name]  Procedure(s) Performed: FLEXIBLE SIGMOIDOSCOPY (N/A )  Patient Location: PACU and Endoscopy Unit  Anesthesia Type:General  Level of Consciousness: drowsy  Airway & Oxygen Therapy: Patient Spontanous Breathing and Patient connected to face mask oxygen  Post-op Assessment: Report given to RN and Post -op Vital signs reviewed and stable  Post vital signs: Reviewed and stable  Last Vitals:  Vitals Value Taken Time  BP 125/58   Temp    Pulse 60   Resp 12   SpO2 100     Last Pain:  Vitals:   05/17/20 1417  TempSrc:   PainSc: 0-No pain         Complications: No complications documented.

## 2020-05-17 NOTE — Anesthesia Postprocedure Evaluation (Signed)
Anesthesia Post Note  [name]  Procedure(s) Performed: FLEXIBLE SIGMOIDOSCOPY (N/A )  Patient location during evaluation: Endoscopy Anesthesia Type: General Level of consciousness: awake and alert and oriented Pain management: pain level controlled Vital Signs Assessment: post-procedure vital signs reviewed and stable Respiratory status: spontaneous breathing Cardiovascular status: blood pressure returned to baseline Anesthetic complications: no   No complications documented.   Last Vitals:  Vitals:   05/17/20 1417 05/17/20 1514  BP: (!) 147/66 118/66  Pulse: 66 60  Resp: 19   Temp: (!) 35.8 C (!) 35.9 C  SpO2: 95% 100%    Last Pain:  Vitals:   05/17/20 1514  TempSrc: Temporal  PainSc: 0-No pain                 CARROLL,PAUL

## 2020-05-17 NOTE — Op Note (Signed)
[facility] Gastroenterology Patient Name: Kristi [name] Procedure Date: 05/17/2020 2:26 PM MRN: [id number] Account #: [id number] Date of Birth: 19-Mar-1941 Admit Type: Inpatient Age: 80 Room: [facility] ENDO ROOM 4 Gender: Female Note Status: Finalized Procedure:             Flexible Sigmoidoscopy Indications:           Rectal hemorrhage, Angioectasia Providers:             Lin Landsman MD, MD Referring MD:          Lorie Apley. Jimmye Norman (Referring MD) Medicines:             General Anesthesia Complications:         No immediate complications. Estimated blood loss: None. Procedure:             Pre-Anesthesia Assessment:                        - Prior to the procedure, a History and Physical was                         performed, and patient medications and allergies were                         reviewed. The patient is competent. The risks and                         benefits of the procedure and the sedation options and                         risks were discussed with the patient. All questions                         were answered and informed consent was obtained.                         Patient identification and proposed procedure were                         verified by the physician, the nurse, the                         anesthesiologist, the anesthetist and the technician                         in the pre-procedure area in the procedure room in the                         endoscopy suite. Mental Status Examination: alert and                         oriented. Airway Examination: normal oropharyngeal                         airway and neck mobility. Respiratory Examination:                         clear to auscultation. CV Examination: irregularly  irregular rate and rhythm. Prophylactic Antibiotics:                         The patient does not require prophylactic antibiotics.                         Prior Anticoagulants: The patient  has taken Xarelto                         (rivaroxaban), last dose was 2 days prior to                         procedure. ASA Grade Assessment: III - A patient with                         severe systemic disease. After reviewing the risks and                         benefits, the patient was deemed in satisfactory                         condition to undergo the procedure. The anesthesia                         plan was to use general anesthesia. Immediately prior                         to administration of medications, the patient was                         re-assessed for adequacy to receive sedatives. The                         heart rate, respiratory rate, oxygen saturations,                         blood pressure, adequacy of pulmonary ventilation, and                         response to care were monitored throughout the                         procedure. The physical status of the patient was                         re-assessed after the procedure.                        After obtaining informed consent, the scope was passed                         under direct vision. The Endoscope was introduced                         through the anus and advanced to the the descending                         colon. The flexible sigmoidoscopy was accomplished  without difficulty. The patient tolerated the                         procedure well. The quality of the bowel preparation                         was adequate. Findings:      The perianal and digital rectal examinations were normal. Pertinent       negatives include normal sphincter tone and no palpable rectal lesions.      A few small localized angioectasias with stigmata of recent bleeding       were found in the distal rectum. Coagulation for hemostasis using argon       plasma was successful. Estimated blood loss: none. 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 Treated with argon plasma                         coagulation (APC).                        - No specimens collected. Recommendation:        - Return patient to hospital ward for possible                         discharge same day.                        - Resume previous diet today.                        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 All rights reserved. The codes documented in this report are preliminary and upon coder review may  be revised to meet current compliance requirements. Dr. Ulyess Mort Lin Landsman MD, MD 05/17/2020 3:10:04 PM This report has been signed electronically. Number of Addenda: 0 Note Initiated On: 05/17/2020 2:26 PM Total Procedure Duration: 0 hours 11 minutes 9 seconds  Estimated Blood Loss:  Estimated blood loss: none.      [facility]

## 2020-05-18 ENCOUNTER — Encounter: Payer: Self-pay | Admitting: Gastroenterology

## 2020-05-18 DIAGNOSIS — R296 Repeated falls: Secondary | ICD-10-CM | POA: Diagnosis not present

## 2020-05-18 DIAGNOSIS — E876 Hypokalemia: Secondary | ICD-10-CM | POA: Diagnosis not present

## 2020-05-18 DIAGNOSIS — M5416 Radiculopathy, lumbar region: Secondary | ICD-10-CM

## 2020-05-18 DIAGNOSIS — R7401 Elevation of levels of liver transaminase levels: Secondary | ICD-10-CM | POA: Diagnosis not present

## 2020-05-18 LAB — CBC
HCT: 28.7 % — ABNORMAL LOW (ref 36.0–46.0)
Hemoglobin: 9.4 g/dL — ABNORMAL LOW (ref 12.0–15.0)
MCH: 31.2 pg (ref 26.0–34.0)
MCHC: 32.8 g/dL (ref 30.0–36.0)
MCV: 95.3 fL (ref 80.0–100.0)
Platelets: 180 10*3/uL (ref 150–400)
RBC: 3.01 MIL/uL — ABNORMAL LOW (ref 3.87–5.11)
RDW: 15.8 % — ABNORMAL HIGH (ref 11.5–15.5)
WBC: 4.1 10*3/uL (ref 4.0–10.5)
nRBC: 0 % (ref 0.0–0.2)

## 2020-05-18 LAB — COMPREHENSIVE METABOLIC PANEL
ALT: 111 U/L — ABNORMAL HIGH (ref 0–44)
AST: 132 U/L — ABNORMAL HIGH (ref 15–41)
Albumin: 2.9 g/dL — ABNORMAL LOW (ref 3.5–5.0)
Alkaline Phosphatase: 57 U/L (ref 38–126)
Anion gap: 7 (ref 5–15)
BUN: 15 mg/dL (ref 8–23)
CO2: 33 mmol/L — ABNORMAL HIGH (ref 22–32)
Calcium: 8.7 mg/dL — ABNORMAL LOW (ref 8.9–10.3)
Chloride: 100 mmol/L (ref 98–111)
Creatinine, Ser: 0.83 mg/dL (ref 0.44–1.00)
GFR, Estimated: >60 mL/min (ref >60)
Glucose, Bld: 79 mg/dL (ref 70–99)
Potassium: 2.8 mmol/L — ABNORMAL LOW (ref 3.5–5.1)
Sodium: 140 mmol/L (ref 135–145)
Total Bilirubin: 0.6 mg/dL (ref 0.3–1.2)
Total Protein: 5.5 g/dL — ABNORMAL LOW (ref 6.5–8.1)

## 2020-05-18 LAB — GLUCOSE, CAPILLARY
Glucose-Capillary: 76 mg/dL (ref 70–99)
Glucose-Capillary: 131 mg/dL — ABNORMAL HIGH (ref 70–99)
Glucose-Capillary: 98 mg/dL (ref 70–99)
Glucose-Capillary: 119 mg/dL — ABNORMAL HIGH (ref 70–99)

## 2020-05-18 MED ORDER — POTASSIUM CHLORIDE CRYS ER 20 MEQ PO TBCR
40.0000 meq | EXTENDED_RELEASE_TABLET | ORAL | Status: AC
  Administered 2020-05-18 (×3): 40 meq via ORAL
  Filled 2020-05-18 (×3): qty 2

## 2020-05-18 MED ORDER — GABAPENTIN 100 MG PO CAPS
200.0000 mg | ORAL_CAPSULE | Freq: Three times a day (TID) | ORAL | Status: DC
  Administered 2020-05-18 – 2020-05-21 (×8): 200 mg via ORAL
  Filled 2020-05-18 (×8): qty 2

## 2020-05-18 MED ORDER — POTASSIUM CHLORIDE CRYS ER 20 MEQ PO TBCR
40.0000 meq | EXTENDED_RELEASE_TABLET | Freq: Two times a day (BID) | ORAL | Status: AC
  Administered 2020-05-19 (×2): 40 meq via ORAL
  Filled 2020-05-18 (×2): qty 2

## 2020-05-18 MED ORDER — HYDROCODONE-ACETAMINOPHEN 5-325 MG PO TABS
1.0000 | ORAL_TABLET | Freq: Three times a day (TID) | ORAL | Status: DC | PRN
  Administered 2020-05-18 – 2020-05-20 (×3): 1 via ORAL
  Filled 2020-05-18 (×3): qty 1

## 2020-05-18 NOTE — NC FL2 (Signed)
[place] LEVEL OF CARE SCREENING TOOL     IDENTIFICATION  Patient Name: Kristi [name] Birthdate: [birth year] Sex: female Admission Date (Current Location): 05/15/2020  [place] and [place] Number:  Engineering geologist and Address:  [facility], [address], [place],  [address]      Provider Number: [id number]  Attending Physician Name and Address:  Ezekiel Slocumb, DO  Relative Name and Phone Number:  Lamar Benes [phone]    Current Level of Care: Hospital Recommended Level of Care: [place] Prior Approval Number:    Date Approved/Denied:   PASRR Number: [id number]  Discharge Plan: SNF    Current Diagnoses: Patient Active Problem List   Diagnosis Date Noted  . AKI (acute kidney injury) ([place]) 05/16/2020  . Transaminitis 05/16/2020  . GI bleed 01/28/2020  . Rectal bleed 01/28/2020  . At risk for polypharmacy 01/28/2020  . Acute delirium   . HCAP (healthcare-associated pneumonia)   . Hypotension   . Hyponatremia   . Lobar pneumonia ([place]) 10/23/2019  . Atrial flutter ([place])   . Dizziness   . Atrial fibrillation status post cardioversion ([place]) 09/27/2019  . CHF (congestive heart failure) ([place]) 09/26/2019  . Acute on chronic diastolic heart failure ([place]) 09/25/2019  . Hypertensive urgency 09/25/2019  . Fluid overload 09/25/2019  . Excessive gas 05/14/2019  . Generalized bloating 05/14/2019  . Bright red blood per rectum 05/14/2019  . Major depressive disorder, recurrent severe without psychotic features ([place]) 12/01/2018  . Chest pain 11/20/2018  . Difficulty walking 11/19/2018  . Insomnia 11/15/2018  . Type 2 diabetes mellitus with hyperlipidemia ([place]) 11/15/2018  . Goals of care, counseling/discussion 02/06/2018  . 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 Rectal mass 01/05/2018  . Leg pain, bilateral 12/06/2017  . Lymphedema 07/10/2017  . Atherosclerosis of native arteries of the  extremities with ulceration ([place]) 07/10/2017  . Stricture and stenosis of esophagus   . Dysphagia   . Syncope and collapse 04/11/2017  . Mild dementia ([place]) 01/24/2017  . Lumbar facet osteoarthritis (Bilateral) 01/17/2017  . Acute postoperative pain 01/17/2017  . Hypokalemia 12/24/2016  . Anxiety 12/07/2016  . 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 Cerebrovascular accident ([place]) 12/07/2016  . Chronic depression 12/07/2016  . Essential hypertension 12/07/2016  . Hypothyroidism 12/07/2016  . Mixed hyperlipidemia 12/07/2016  . Myocardial infarction ([place]) 12/07/2016  . TIA (transient ischemic attack) 11/17/2016  . Parkinson disease ([place]) 10/27/2016  . Tremor 10/27/2016  . Chronic knee pain (B) (R>L) 10/19/2016  . Ischemic chest pain ([place]) 09/29/2016  . Abnormal CT scan, lumbar spine 09/08/2016  . Lumbar foraminal stenosis (multilevel) 09/08/2016  . Fracture of clavicle 08/03/2016  . Polyneuropathy 05/17/2016  . Neurogenic pain 04/12/2016  . Chronic lower extremity pain (Bilateral) (R>L) 04/12/2016  . Chronic lower extremity radicular pain (Primary Source of Pain) (Bilateral) (R>L) 04/12/2016  . Lower extremity weakness 04/12/2016  . Chronic low back pain (Secondary source of pain) (Bilateral) (R>L) 04/12/2016  . Chronic wrist pain (Left) (secondary to fracture) 04/12/2016  . Lumbar spondylosis 04/12/2016  . Chronic pain syndrome 04/11/2016  . Long term current use of opiate analgesic 04/11/2016  . Long term prescription opiate use 04/11/2016  . Opiate use 04/11/2016  . Long term prescription benzodiazepine use 04/11/2016  . Lumbar radiculopathy (Multilevel) (Bilateral) 05/07/2015  . Lumbar facet syndrome (Bilateral) (R>L) 08/21/2014  . 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 Sacroiliac joint dysfunction (Bilateral) 08/13/2014  .  Frequent falls 06/04/2014  . Memory loss 06/04/2014  . Anxiety and depression 08/22/2013  . CAD (coronary artery  disease) 08/22/2013  . Therapeutic opioid-induced constipation (OIC) 08/22/2013  . GERD (gastroesophageal reflux disease) 08/22/2013    Orientation RESPIRATION BLADDER Height & Weight     Self  Normal Continent Weight: 89.2 kg Height:  5\' 6"  (167.6 cm)  BEHAVIORAL SYMPTOMS/MOOD NEUROLOGICAL BOWEL NUTRITION STATUS      Continent Diet  AMBULATORY STATUS COMMUNICATION OF NEEDS Skin   Limited Assist Verbally Normal                       Personal Care Assistance Level of Assistance  Bathing,Feeding,Dressing Bathing Assistance: Limited assistance Feeding assistance: Limited assistance Dressing Assistance: Limited assistance     Functional Limitations Info  Sight,Hearing,Speech Sight Info: Adequate Hearing Info: Impaired (Hard of Hearing, speak loud) Speech Info: Adequate    SPECIAL CARE FACTORS FREQUENCY  PT (By licensed PT),OT (By licensed OT)     PT Frequency: 5X Week OT Frequency: 5X Week            Contractures Contractures Info: Not present    Additional Factors Info  Code Status,Allergies Code Status Info: Full Allergies Info: PCN           Current Medications (05/18/2020):  This is the current hospital active medication list Current Facility-Administered Medications  Medication Dose Route Frequency Provider Last Rate Last Admin  . acetaminophen (TYLENOL) tablet 650 mg  650 mg Oral Q6H PRN Mansy, Jan A, MD   650 mg at 05/16/20 4401   Or  . acetaminophen (TYLENOL) suppository 650 mg  650 mg Rectal Q6H PRN Mansy, Jan A, MD      . albuterol (VENTOLIN HFA) 108 (90 Base) MCG/ACT inhaler 2 puff  2 puff Inhalation Q6H PRN Mansy, Jan A, MD      . cholecalciferol (VITAMIN D3) tablet 1,000 Units  1,000 Units Oral Daily Mansy, Jan A, MD   1,000 Units at 05/16/20 0945  . donepezil (ARICEPT) tablet 10 mg  10 mg Oral QHS Mansy, Jan A, MD   10 mg at 05/17/20 2108  . ferrous sulfate tablet 325 mg  325 mg Oral BID WC Mansy, Jan A, MD   325 mg at 05/17/20 1651  .  gabapentin (NEURONTIN) capsule 300 mg  300 mg Oral BID Mansy, Jan A, MD   300 mg at 05/16/20 1713  . gabapentin (NEURONTIN) capsule 600 mg  600 mg Oral QHS Mansy, Jan A, MD   600 mg at 05/17/20 2108  . hydrOXYzine (ATARAX/VISTARIL) tablet 25 mg  25 mg Oral BID Mansy, Jan A, MD   25 mg at 05/17/20 2108  . insulin aspart (novoLOG) injection 0-9 Units  0-9 Units Subcutaneous TID PC & HS Mansy, Arvella Merles, MD   2 Units at 05/17/20 2110  . insulin detemir (LEVEMIR) injection 5 Units  5 Units Subcutaneous QHS Mansy, Arvella Merles, MD   5 Units at 05/17/20 2110  . levothyroxine (SYNTHROID) tablet 125 mcg  125 mcg Oral Daily Mansy, Jan A, MD   125 mcg at 05/18/20 0272  . linaclotide (LINZESS) capsule 72 mcg  72 mcg Oral q morning Mansy, Jan A, MD   72 mcg at 05/16/20 0943  . loratadine (CLARITIN) tablet 10 mg  10 mg Oral Daily Mansy, Jan A, MD   10 mg at 05/16/20 0947  . meclizine (ANTIVERT) tablet 12.5 mg  12.5 mg Oral BID PRN Mansy, Jan A,  MD      . metoprolol succinate (TOPROL-XL) 24 hr tablet 12.5 mg  12.5 mg Oral QHS Mansy, Jan A, MD   12.5 mg at 05/17/20 2108  . montelukast (SINGULAIR) tablet 10 mg  10 mg Oral QHS Mansy, Jan A, MD   10 mg at 05/17/20 2108  . multivitamin with minerals tablet 1 tablet  1 tablet Oral Daily Mansy, Jan A, MD   1 tablet at 05/16/20 0945  . nitroGLYCERIN (NITROSTAT) SL tablet 0.4 mg  0.4 mg Sublingual Q5 min PRN Mansy, Jan A, MD      . OLANZapine [facility]) tablet 2.5 mg  2.5 mg Oral BID Mansy, Jan A, MD   2.5 mg at 05/17/20 2109  . ondansetron (ZOFRAN) tablet 4 mg  4 mg Oral Q6H PRN Mansy, Jan A, MD       Or  . ondansetron [facility]) injection 4 mg  4 mg Intravenous Q6H PRN Mansy, Jan A, MD      . pantoprazole (PROTONIX) EC tablet 40 mg  40 mg Oral Daily Nicole Kindred A, DO   40 mg at 05/16/20 2100  . potassium chloride SA (KLOR-CON) CR tablet 40 mEq  40 mEq Oral Q4H Nicole Kindred A, DO      . [START ON 05/19/2020] potassium chloride SA (KLOR-CON) CR tablet 40 mEq  40 mEq Oral BID  Nicole Kindred A, DO      . sertraline (ZOLOFT) tablet 100 mg  100 mg Oral BID Mansy, Jan A, MD   100 mg at 05/17/20 2109  . traMADol (ULTRAM) tablet 50 mg  50 mg Oral Q8H PRN Mansy, Jan A, MD      . traZODone (DESYREL) tablet 100 mg  100 mg Oral QHS Mansy, Jan A, MD   100 mg at 05/17/20 2108  . vitamin B-12 (CYANOCOBALAMIN) tablet 1,000 mcg  1,000 mcg Oral Daily Mansy, Jan A, MD   1,000 mcg at 05/16/20 0945  . vitamin E capsule 400 Units  400 Units Oral Daily Mansy, Jan A, MD   400 Units at 05/16/20 5449     Discharge Medications: Please see discharge summary for a list of discharge medications.  Relevant Imaging Results:  Relevant Lab Results:   Additional Information    Kerin Salen, RN

## 2020-05-18 NOTE — Care Management Important Message (Signed)
Important Message  Patient Details  Name: Kristi [name] MRN: [id number] Date of Birth: May 30, 1940   Medicare Important Message Given:  Yes     Dannette Barbara 05/18/2020, 11:30 AM

## 2020-05-18 NOTE — Progress Notes (Signed)
Report given to [place], [place]

## 2020-05-18 NOTE — Progress Notes (Addendum)
PROGRESS NOTE    Kristi [name]   XAJ:[id number]  DOB: 02-23-1941  PCP: Martin Majestic, FNP    DOA: 05/15/2020 LOS: 3   Brief Narrative   Summary of HPI on admission: 80 y.o. Caucasian female with medical history significant for anxiety, osteoarthritis, coronary artery disease, type 2 diabetes mellitus, hypertension, dyslipidemia, hypothyroidism and Parkinson's disease and status post pacemaker, presented to the ED on 05/15/20 with worsening generalized weakness, recurrent falls over the last 3 weeks, rectal bleeding, leg cramping and involuntary jerking movements.  She called 911 due to inability to get out of  bed.  ED Course: Vitals stable, HR 59 Labs Na 133, Cl 77, K <2, AST 166, ALT 139, Tbili 8.2 Imaging: CT abd/pelvis with sigmoid diverticulosis w/o acute diverticulitis, small hiatal hernia, no acute abnormalities.  CT head negative for acute findings. Treated with K replacement, IV fluid bolus. Admitted to hospitalist service for further evaluation and management.  Hospital course to date:     Assessment & Plan   Principal Problem:   Rectal bleed Active Problems:   Hypokalemia   AKI (acute kidney injury) ([place])   Transaminitis   Frequent falls   Essential hypertension   Type 2 diabetes mellitus with hyperlipidemia (HCC)   Atrial fibrillation status post cardioversion (HCC)   Lumbar radiculopathy (Multilevel) (Bilateral)   Anxiety and depression   CAD (coronary artery disease)   GERD (gastroesophageal reflux disease)   Chronic pain syndrome   Parkinson disease (HCC)   Hypothyroidism   Mixed hyperlipidemia   Rectal bleed /history of rectal cancer status post excision and radiation /history of radiation proctitis /history of intermittent rectal bleeding-most recent bleeding episode was November 2021. Acute on chronic anemia -due to blood loss as above --GI consulted --Continue full liquid diet today --Flex sig on 2/27 - few small localized angioectasias  with stigmata of recent bleeding in the distal rectum. Coagulation for hemostasis using argon plasma was successful --Trend hemoglobin, transfuse if less than 7.0  Hypokalemia -severe on admission less than 2.0.   Aggressively replacing.  Monitor labs & further replacement as needed. --Reported history of recurrent/chronic hypokalemia --Patient reports compliance with her home potassium supplements  Myoclonus and asterixis -likely multifactorial due to hypokalemia, gabapentin toxicity in setting of AKI.   Neurology consulted, assistance appreciated.   Improved with holding gabapentin and replacing potassium. Monitor.    Bilateral leg pain / Bilateral Lumbar Radiculopathy - having severe pain not relieved by pain medicine today, with holding of gabapentin.  AKI has resolved, myoclonic movements improved.    AKI -likely due to hypovolemia, however patient with urinary retention this morning.  Renal ultrasound without obstruction/hydro.  Bladder scans in and out cath for retention.   --AKI resolved, monitor BMP  Transaminitis - RUQ ultrasound unremarkable.   Viral hepatitis panel negative. Monitor CMP daily.    Frequent falls -PT and OT recommend SNF.  TOC working on placement.  Essential hypertension -continue metoprolol  Type 2 diabetes mellitus with hyperlipidemia and neuropathy -continued on home Levemir.  Hold Actos.  Sliding scale NovoLog. -gabapentin stopped per neuro due to toxicity causing involuntary myoclonic movements.  Patient having severe leg pain off gabapentin.  AKI resolved, may try resuming low dose tomorrow vs alternate  Atrial fibrillation status post cardioversion -.  Hold Eliquis given bleeding.  Chronic diastolic CHF -last echo May 2021 with low normal EF 50 to 55%, mild LVH, indeterminate diastolic parameters, moderate pulmonary hypertension, severe left atrial enlargement.  Appears euvolemic and compensated. --Continue  Toprol --Diuretics are held for now given  AKI  CAD -no active chest pain.  Continue home metoprolol nitro.  GERD -continue PPI  ?  Asthma /allergic rhinitis-continue as needed albuterol, Claritin, Singulair  Chronic pain syndrome due to severe lumbar stenosis and radiculopathy.  Continue home tramadol  Parkinson disease /dementia /depression anxiety /insomnia-continue home medications: Aricept, Zyprexa, Zoloft, Vistaril, trazodone  Vertigo/dizziness -continue home meclizine as needed  Hypothyroidism -continue Synthroid  Mixed hyperlipidemia -hold statin given elevated LFTs  Obesity: Body mass index is 31.75 kg/m.  Complicates overall care and prognosis.  DVT prophylaxis: SCDs Start: 05/15/20 2010   Diet:  Diet Orders (From admission, onward)    Start     Ordered   05/17/20 1519  Diet heart healthy/carb modified Room service appropriate? Yes; Fluid consistency: Thin  Diet effective now       Question Answer Comment  Diet-HS Snack? Nothing   Room service appropriate? Yes   Fluid consistency: Thin      05/17/20 1518            Code Status: Full Code    Subjective 05/18/20    Patient reports feeling okay, but says she is miserable here.  Says she needs to be sitting up straight in the bed to eat, or she chokes.  States has not eaten any of her meals.  Not having jerking movements today.  Having severe bilateral leg pain today.  Disposition Plan & Communication   Status is: Inpatient  Inpatient appropriate due to severity of illness with recurrent hypokalemia despite replacement.  Pending SNF placement.  Dispo: The patient is from: Home              Anticipated d/c is to: SNF              Patient currently not medically stable for discharge   Difficult to place patient now   Family Communication: None at bedside on rounds today, will attempt to call   Consults, Procedures, Significant Events   Consultants:   Gastroenterology  Procedures:   Flex sigmoidoscopy is planned 2/27  Antimicrobials:   Anti-infectives (From admission, onward)   None        Micro    Objective   Vitals:   05/18/20 0340 05/18/20 0912 05/18/20 1148 05/18/20 1734  BP: (!) 99/48 128/60 117/65 (!) 137/52  Pulse: 72  60 64  Resp: 16 15 19 20   Temp: 98.1 F (36.7 C)  98 F (36.7 C) 98.1 F (36.7 C)  TempSrc: Axillary  Oral Oral  SpO2: 91%  93% 98%  Weight: 89.2 kg     Height:        Intake/Output Summary (Last 24 hours) at 05/18/2020 1952 Last data filed at 05/18/2020 1837 Gross per 24 hour  Intake 480 ml  Output 1800 ml  Net -1320 ml   Filed Weights   05/16/20 0323 05/17/20 0327 05/18/20 0340  Weight: 87.7 kg 87.2 kg 89.2 kg    Physical Exam:  General exam: awake, alert, no acute distress Respiratory system: normal respiratory effort, on room air Cardiovascular system: RRR, no peripheral edema.   Gastrointestinal system: soft, NT, ND Central nervous system: A&O x 3. normal speech, no myoclonic movements noted during encounter today Extremities: moves all, no edema, normal tone   Labs   Data Reviewed: I have personally reviewed following labs and imaging studies  CBC: Recent Labs  Lab 05/15/20 1606 05/15/20 2109 05/16/20 0449 05/16/20 0809 05/16/20 1418 05/16/20 2113 05/17/20 7371  05/18/20 0424  WBC 5.2  --  3.2*  --   --   --  3.5* 4.1  HGB 11.7*   < > 9.8* 9.8* 10.1* 10.6* 10.6* 9.4*  HCT 36.1   < > 31.2* 30.3* 31.4* 33.9* 31.4* 28.7*  MCV 94.5  --  97.2  --   --   --  92.6 95.3  PLT 209  --  158  --   --   --  177 180   < > = values in this interval not displayed.   Basic Metabolic Panel: Recent Labs  Lab 05/15/20 1606 05/15/20 1643 05/16/20 0449 05/16/20 0809 05/17/20 0553 05/18/20 0424  NA 133*  --  140  --  142 140  K <2.0*  --  3.0* 3.1* 3.8 2.8*  CL 77*  --  92*  --  97* 100  CO2 37*  --  29  --  34* 33*  GLUCOSE 251*  --  105*  --  72 79  BUN 37*  --  35*  --  28* 15  CREATININE 2.43*  --  2.18*  --  1.52* 0.83  CALCIUM 9.4  --  8.7*  --  9.1 8.7*   MG  --  2.4  --   --  2.5*  --    GFR: Estimated Creatinine Clearance: 59.8 mL/min (by C-G formula based on SCr of 0.83 mg/dL). Liver Function Tests: Recent Labs  Lab 05/15/20 1606 05/16/20 0809 05/17/20 0553 05/18/20 0424  AST 166* 123* 145* 132*  ALT 139* 103* 119* 111*  ALKPHOS 96 66 64 57  BILITOT 0.9 0.6 0.7 0.6  PROT 8.2* 6.5 5.9* 5.5*  ALBUMIN 4.4 3.3* 3.1* 2.9*   No results for input(s): LIPASE, AMYLASE in the last 168 hours. Recent Labs  Lab 05/17/20 1652  AMMONIA 20   Coagulation Profile: Recent Labs  Lab 05/15/20 2109  INR 1.2   Cardiac Enzymes: No results for input(s): CKTOTAL, CKMB, CKMBINDEX, TROPONINI in the last 168 hours. BNP (last 3 results) No results for input(s): PROBNP in the last 8760 hours. HbA1C: No results for input(s): HGBA1C in the last 72 hours. CBG: Recent Labs  Lab 05/17/20 1625 05/17/20 2008 05/18/20 0824 05/18/20 1301 05/18/20 1738  GLUCAP 88 151* 76 131* 98   Lipid Profile: No results for input(s): CHOL, HDL, LDLCALC, TRIG, CHOLHDL, LDLDIRECT in the last 72 hours. Thyroid Function Tests: No results for input(s): TSH, T4TOTAL, FREET4, T3FREE, THYROIDAB in the last 72 hours. Anemia Panel: Recent Labs    05/17/20 1652  FERRITIN 114  TIBC 349  IRON 48   Sepsis Labs: No results for input(s): PROCALCITON, LATICACIDVEN in the last 168 hours.  Recent Results (from the past 240 hour(s))  Resp Panel by RT-PCR (Flu A&B, Covid) Nasopharyngeal Swab     Status: None   Collection Time: 05/15/20  3:12 PM   Specimen: Nasopharyngeal Swab; Nasopharyngeal(NP) swabs in vial transport medium  Result Value Ref Range Status   SARS Coronavirus 2 by RT PCR NEGATIVE NEGATIVE Final    Comment: (NOTE) SARS-CoV-2 target nucleic acids are NOT DETECTED.  The SARS-CoV-2 RNA is generally detectable in upper respiratory specimens during the acute phase of infection. The lowest concentration of SARS-CoV-2 viral copies this assay can detect  is 138 copies/mL. A negative result does not preclude SARS-Cov-2 infection and should not be used as the sole basis for treatment or other patient management decisions. A negative result may occur with  improper specimen collection/handling, submission  of specimen other than nasopharyngeal swab, presence of viral mutation(s) within the areas targeted by this assay, and inadequate number of viral copies(<138 copies/mL). A negative result must be combined with clinical observations, patient history, and epidemiological information. The expected result is Negative.  Fact Sheet for Patients:  EntrepreneurPulse.com.au  Fact Sheet for Healthcare Providers:  IncredibleEmployment.be  This test is no t yet approved or cleared by the [place] FDA and  has been authorized for detection and/or diagnosis of SARS-CoV-2 by FDA under an Emergency Use Authorization (EUA). This EUA will remain  in effect (meaning this test can be used) for the duration of the COVID-19 declaration under Section 564(b)(1) of the Act, 21 U.S.C.section 360bbb-3(b)(1), unless the authorization is terminated  or revoked sooner.       Influenza A by PCR NEGATIVE NEGATIVE Final   Influenza B by PCR NEGATIVE NEGATIVE Final    Comment: (NOTE) The Xpert Xpress SARS-CoV-2/FLU/RSV plus assay is intended as an aid in the diagnosis of influenza from Nasopharyngeal swab specimens and should not be used as a sole basis for treatment. Nasal washings and aspirates are unacceptable for Xpert Xpress SARS-CoV-2/FLU/RSV testing.  Fact Sheet for Patients: EntrepreneurPulse.com.au  Fact Sheet for Healthcare Providers: IncredibleEmployment.be  This test is not yet approved or cleared by the [place] FDA and has been authorized for detection and/or diagnosis of SARS-CoV-2 by FDA under an Emergency Use Authorization (EUA). This EUA will remain in effect  (meaning this test can be used) for the duration of the COVID-19 declaration under Section 564(b)(1) of the Act, 21 U.S.C. section 360bbb-3(b)(1), unless the authorization is terminated or revoked.  Performed at [facility], [address] [place], [place] [address]       Imaging Studies   No results found.   Medications   Scheduled Meds: . cholecalciferol  1,000 Units Oral Daily  . donepezil  10 mg Oral QHS  . ferrous sulfate  325 mg Oral BID WC  . hydrOXYzine  25 mg Oral BID  . insulin aspart  0-9 Units Subcutaneous TID PC & HS  . insulin detemir  5 Units Subcutaneous QHS  . levothyroxine  125 mcg Oral Daily  . linaclotide  72 mcg Oral q morning  . loratadine  10 mg Oral Daily  . metoprolol succinate  12.5 mg Oral QHS  . montelukast  10 mg Oral QHS  . multivitamin with minerals  1 tablet Oral Daily  . OLANZapine  2.5 mg Oral BID  . pantoprazole  40 mg Oral Daily  . [START ON 05/19/2020] potassium chloride  40 mEq Oral BID  . sertraline  100 mg Oral BID  . traZODone  100 mg Oral QHS  . vitamin B-12  1,000 mcg Oral Daily  . vitamin E  400 Units Oral Daily   Continuous Infusions:      LOS: 3 days    Time spent: 25 minutes with greater than 50% spent at bedside and in coordination of care.    Ezekiel Slocumb, DO [facility]  05/18/2020, 7:52 PM      If 7PM-7AM, please contact night-coverage. How to contact the [facility] Attending or Consulting provider [place] or covering provider during after hours [place], for this patient?    1. Check the care team in [facility] and look for a) attending/consulting TRH provider listed and b) the [facility] team listed 2. Log into [email or web address] and use [place]'s universal password to access. If you do not have  the password, please contact the hospital operator. 3. Locate the [facility] provider you are looking for under [facility] and page to a number that you can be directly reached. 4. If you still have difficulty  reaching the provider, please page the [facility] (Director on Call) for the Hospitalists listed on amion for assistance.

## 2020-05-19 DIAGNOSIS — G894 Chronic pain syndrome: Secondary | ICD-10-CM | POA: Diagnosis not present

## 2020-05-19 DIAGNOSIS — R7401 Elevation of levels of liver transaminase levels: Secondary | ICD-10-CM | POA: Diagnosis not present

## 2020-05-19 DIAGNOSIS — M5416 Radiculopathy, lumbar region: Secondary | ICD-10-CM | POA: Diagnosis not present

## 2020-05-19 DIAGNOSIS — E876 Hypokalemia: Secondary | ICD-10-CM | POA: Diagnosis not present

## 2020-05-19 LAB — COMPREHENSIVE METABOLIC PANEL
ALT: 102 U/L — ABNORMAL HIGH (ref 0–44)
AST: 117 U/L — ABNORMAL HIGH (ref 15–41)
Albumin: 3.1 g/dL — ABNORMAL LOW (ref 3.5–5.0)
Alkaline Phosphatase: 57 U/L (ref 38–126)
Anion gap: 5 (ref 5–15)
BUN: 13 mg/dL (ref 8–23)
CO2: 29 mmol/L (ref 22–32)
Calcium: 9.1 mg/dL (ref 8.9–10.3)
Chloride: 106 mmol/L (ref 98–111)
Creatinine, Ser: 0.86 mg/dL (ref 0.44–1.00)
GFR, Estimated: >60 mL/min (ref >60)
Glucose, Bld: 121 mg/dL — ABNORMAL HIGH (ref 70–99)
Potassium: 4.2 mmol/L (ref 3.5–5.1)
Sodium: 140 mmol/L (ref 135–145)
Total Bilirubin: 0.6 mg/dL (ref 0.3–1.2)
Total Protein: 6 g/dL — ABNORMAL LOW (ref 6.5–8.1)

## 2020-05-19 LAB — CBC
HCT: 30.8 % — ABNORMAL LOW (ref 36.0–46.0)
Hemoglobin: 10.2 g/dL — ABNORMAL LOW (ref 12.0–15.0)
MCH: 31.5 pg (ref 26.0–34.0)
MCHC: 33.1 g/dL (ref 30.0–36.0)
MCV: 95.1 fL (ref 80.0–100.0)
Platelets: 190 10*3/uL (ref 150–400)
RBC: 3.24 MIL/uL — ABNORMAL LOW (ref 3.87–5.11)
RDW: 16 % — ABNORMAL HIGH (ref 11.5–15.5)
WBC: 5.1 10*3/uL (ref 4.0–10.5)
nRBC: 0 % (ref 0.0–0.2)

## 2020-05-19 LAB — GLUCOSE, CAPILLARY
Glucose-Capillary: 97 mg/dL (ref 70–99)
Glucose-Capillary: 93 mg/dL (ref 70–99)
Glucose-Capillary: 84 mg/dL (ref 70–99)
Glucose-Capillary: 109 mg/dL — ABNORMAL HIGH (ref 70–99)

## 2020-05-19 MED ORDER — MEMANTINE HCL 5 MG PO TABS
5.0000 mg | ORAL_TABLET | Freq: Every day | ORAL | Status: DC
  Administered 2020-05-19 – 2020-05-20 (×2): 5 mg via ORAL
  Filled 2020-05-19 (×2): qty 1

## 2020-05-19 MED ORDER — TORSEMIDE 20 MG PO TABS
40.0000 mg | ORAL_TABLET | Freq: Two times a day (BID) | ORAL | Status: DC
  Administered 2020-05-20 – 2020-05-21 (×3): 40 mg via ORAL
  Filled 2020-05-19 (×4): qty 2

## 2020-05-19 MED ORDER — AMIODARONE HCL 200 MG PO TABS
200.0000 mg | ORAL_TABLET | Freq: Every day | ORAL | Status: DC
Start: 2020-05-20 — End: 2020-05-21
  Administered 2020-05-20: 200 mg via ORAL
  Filled 2020-05-19: qty 1

## 2020-05-19 MED ORDER — LOPERAMIDE HCL 2 MG PO CAPS
2.0000 mg | ORAL_CAPSULE | ORAL | Status: DC | PRN
  Administered 2020-05-19 – 2020-05-20 (×4): 2 mg via ORAL
  Filled 2020-05-19 (×4): qty 1

## 2020-05-19 MED ORDER — METOLAZONE 5 MG PO TABS
5.0000 mg | ORAL_TABLET | Freq: Every day | ORAL | Status: DC
  Administered 2020-05-20: 5 mg via ORAL
  Filled 2020-05-19 (×2): qty 1

## 2020-05-19 MED ORDER — CHOLESTYRAMINE 4 G PO PACK
4.0000 g | PACK | Freq: Every day | ORAL | Status: DC
  Filled 2020-05-19: qty 1

## 2020-05-19 NOTE — Plan of Care (Signed)
  Problem: Education: Goal: Knowledge of General Education information will improve Description: Including pain rating scale, medication(s)/side effects and non-pharmacologic comfort measures Outcome: Progressing   Problem: Health Behavior/Discharge Planning: Goal: Ability to manage health-related needs will improve Outcome: Progressing   Problem: Clinical Measurements: Goal: Will remain free from infection Outcome: Progressing   

## 2020-05-19 NOTE — Progress Notes (Signed)
Physical Therapy Treatment Patient Details Name: Anetta Olvera MRN: [id number] DOB: 07/10/1940 Today's Date: 05/19/2020    History of Present Illness Annalena Piatt is an 4yoF who comes to [facility] 2/25 c worsening generalized weakness and recurrent falls over the last 3 weeks, also reports intermittent involuntary jerking movements over the last 2 weeks as well as bilateral leg cramps. Pt admitted with AKI, hypokalemia, GIB. PMH: GAD, OA, CAD, DM2, HTN, hypoTSH, PD, PPM.    PT Comments    Pt in bed upon entry, RN student in room much of session. minA to EOB, supervision for transfers, minGuard for slow, guarded, weak AMB of 43ft. Pt without any obvious myoclonus, asterixis today, more awake and alert. Will continue to benefit from skilled PT intervention to restore to PLOF.    Follow Up Recommendations  SNF;Supervision for mobility/OOB     Equipment Recommendations  None recommended by PT    Recommendations for Other Services       Precautions / Restrictions Precautions Precautions: Fall Restrictions Weight Bearing Restrictions: No    Mobility  Bed Mobility Overal bed mobility: Needs Assistance Bed Mobility: Supine to Sit     Supine to sit: Min assist     General bed mobility comments: appears stable at EOB feet unsupported.    Transfers Overall transfer level: Needs assistance Equipment used: Rolling walker (2 wheeled) Transfers: Sit to/from Stand Sit to Stand: Supervision         General transfer comment: remains forward flexed at the hips, reports as baseline  Ambulation/Gait Ambulation/Gait assistance: Min guard Gait Distance (Feet): 16 Feet Assistive device: Rolling walker (2 wheeled)       General Gait Details: very, slow, appears weaks   Stairs             Wheelchair Mobility    Modified Rankin (Stroke Patients Only)       Balance                                            Cognition Arousal/Alertness:  Awake/alert Behavior During Therapy: WFL for tasks assessed/performed Overall Cognitive Status: Within Functional Limits for tasks assessed                                 General Comments: ordered an omelette this morning      Exercises      General Comments        Pertinent Vitals/Pain Pain Assessment: No/denies pain    Home Living                      Prior Function            PT Goals (current goals can now be found in the care plan section) Acute Rehab PT Goals Patient Stated Goal: wants to feel better, stop falling PT Goal Formulation: With patient Time For Goal Achievement: 05/30/20 Potential to Achieve Goals: Fair Progress towards PT goals: Progressing toward goals    Frequency    Min 2X/week      PT Plan Current plan remains appropriate    Co-evaluation              AM-PAC PT "6 Clicks" Mobility   Outcome Measure  Help needed turning from your back to your side while in a flat bed without  using bedrails?: A Little Help needed moving from lying on your back to sitting on the side of a flat bed without using bedrails?: A Little Help needed moving to and from a bed to a chair (including a wheelchair)?: A Little Help needed standing up from a chair using your arms (e.g., wheelchair or bedside chair)?: A Little Help needed to walk in hospital room?: A Little Help needed climbing 3-5 steps with a railing? : A Lot 6 Click Score: 17    End of Session Equipment Utilized During Treatment: Gait belt Activity Tolerance: Patient tolerated treatment well;Patient limited by fatigue Patient left: with call bell/phone within reach;in chair;with chair alarm set;with nursing/sitter in room Nurse Communication: Mobility status PT Visit Diagnosis: Difficulty in walking, not elsewhere classified (R26.2);Other abnormalities of gait and mobility (R26.89);Muscle weakness (generalized) (M62.81);Other symptoms and signs involving the nervous  system (E30.159)     Time: 9689-5702 PT Time Calculation (min) (ACUTE ONLY): 24 min  Charges:  $Therapeutic Exercise: 23-37 mins                     12:45 PM, 05/19/20 Etta Grandchild, PT, DPT Physical Therapist - [facility]  [phone] ([place])    [place] C 05/19/2020, 12:44 PM

## 2020-05-19 NOTE — TOC Progression Note (Signed)
Transition of Care [facility]) - Progression Note    Patient Details  Name: Makensie Mulhall MRN: [id number] Date of Birth: [birth year]  Transition of [facility] CM/SW Contact  Beverly Sessions, RN Phone Number: 05/19/2020, 9:18 AM  Clinical Narrative:    No bed offers.  Extended to other local facilities.  Reached out to [facility] and [facility] to request review    Expected Discharge Plan: [place] Barriers to Discharge: Continued Medical Work up  Expected Discharge Plan and Services Expected Discharge Plan: [place]   Discharge Planning Services: CM Consult Post Acute Care Choice: [place] Living arrangements for the past 2 months: Single Family Home                 DME Arranged: N/A DME Agency: NA       HH Arranged: NA HH Agency: NA         Social Determinants of Health (SDOH) Interventions    Readmission Risk Interventions Readmission Risk Prevention Plan 05/16/2020 01/30/2020 10/24/2019  Transportation Screening Complete Complete Complete  Medication Review Press photographer) Complete Complete Complete  PCP or Specialist appointment within 3-5 days of discharge Complete Complete Complete  HRI or Home Care Consult Complete Complete -  SW Recovery Care/Counseling Consult Complete Complete Complete  Palliative Care Screening Not Applicable Not Applicable Not Applicable  Skilled Nursing Facility Complete Not Applicable Not Applicable  Some recent data might be hidden

## 2020-05-19 NOTE — Progress Notes (Signed)
PROGRESS NOTE    Kristi [name]   OVF:[id number]  DOB: [birth year]  PCP: Martin Majestic, FNP    DOA: 05/15/2020 LOS: 4   Brief Narrative   Summary of HPI on admission: 80 y.o. Caucasian female with medical history significant for anxiety, osteoarthritis, coronary artery disease, type 2 diabetes mellitus, hypertension, dyslipidemia, hypothyroidism and Parkinson's disease and status post pacemaker, presented to the ED on 05/15/20 with worsening generalized weakness, recurrent falls over the last 3 weeks, rectal bleeding, leg cramping and involuntary jerking movements.  She called 911 due to inability to get out of  bed.  ED Course: Vitals stable, HR 59 Labs Na 133, Cl 77, K <2, AST 166, ALT 139, Tbili 8.2 Imaging: CT abd/pelvis with sigmoid diverticulosis w/o acute diverticulitis, small hiatal hernia, no acute abnormalities.  CT head negative for acute findings. Treated with K replacement, IV fluid bolus. Admitted to hospitalist service for further evaluation and management.  Hospital course to date:     Assessment & Plan   Principal Problem:   Rectal bleed Active Problems:   Hypokalemia   AKI (acute kidney injury) ([place])   Transaminitis   Frequent falls   Essential hypertension   Type 2 diabetes mellitus with hyperlipidemia (HCC)   Atrial fibrillation status post cardioversion (HCC)   Lumbar radiculopathy (Multilevel) (Bilateral)   Anxiety and depression   CAD (coronary artery disease)   GERD (gastroesophageal reflux disease)   Chronic pain syndrome   Parkinson disease (HCC)   Hypothyroidism   Mixed hyperlipidemia   Rectal bleed /history of rectal cancer status post excision and radiation /history of radiation proctitis /history of intermittent rectal bleeding-most recent bleeding episode was November 2021. Acute on chronic anemia -due to blood loss as above --GI consulted --Continue full liquid diet today --Flex sig on 2/27 - few small localized angioectasias  with stigmata of recent bleeding in the distal rectum. Coagulation for hemostasis using argon plasma was successful --Trend hemoglobin, transfuse if less than 7.0  Hypokalemia -severe on admission less than 2.0.   Aggressively replacing.  Monitor labs & further replacement as needed. --Reported history of recurrent/chronic hypokalemia --Patient reports compliance with her home potassium supplements  Myoclonus and asterixis -likely multifactorial due to hypokalemia, gabapentin toxicity in setting of AKI.   Neurology consulted, assistance appreciated.   Improved with holding gabapentin and replacing potassium. Monitor.    Bilateral leg pain / Bilateral Lumbar Radiculopathy - having severe pain not relieved by pain medicine today, with holding of gabapentin.  AKI has resolved, myoclonic movements improved.    AKI -likely due to hypovolemia, however patient with urinary retention this morning.  Renal ultrasound without obstruction/hydro.  Bladder scans in and out cath for retention.   --AKI resolved, monitor BMP  Transaminitis - RUQ ultrasound unremarkable.   Viral hepatitis panel negative. Monitor CMP daily.    Frequent falls -PT and OT recommend SNF.  TOC working on placement.  Essential hypertension -continue metoprolol  Type 2 diabetes mellitus with hyperlipidemia and neuropathy -continued on home Levemir.  Hold Actos.  Sliding scale NovoLog. -gabapentin stopped per neuro due to toxicity causing involuntary myoclonic movements.  Patient having severe leg pain off gabapentin.  AKI resolved, may try resuming low dose tomorrow vs alternate  Atrial fibrillation status post cardioversion -.  Hold Eliquis given bleeding.  Continue amiodarone.  Chronic diastolic CHF -last echo May 2021 with low normal EF 50 to 55%, mild LVH, indeterminate diastolic parameters, moderate pulmonary hypertension, severe left atrial enlargement.  Appears euvolemic  and compensated. --Continue Toprol --Resume  diuretics now that AKI resolved, monitor renal function closely.  Monitor BP.  CAD -no active chest pain.  Continue home metoprolol nitro.  GERD -continue PPI  ?  Asthma /allergic rhinitis-continue as needed albuterol, Claritin, Singulair  Chronic pain syndrome due to severe lumbar stenosis and radiculopathy.  Tramadol d/c'd by neurology due to lowering seizure threshold (and had stopped gabapentin)  Parkinson disease /dementia /depression anxiety /insomnia-continue home medications: Aricept, Zoloft, Vistaril, trazodone, Namenda --Note on Zyprexa - would not resume  Vertigo/dizziness -continue home meclizine as needed  Hypothyroidism -continue Synthroid  Mixed hyperlipidemia -hold statin given elevated LFTs  Obesity: Body mass index is 31.75 kg/m.  Complicates overall care and prognosis.  DVT prophylaxis: SCDs Start: 05/15/20 2010   Diet:  Diet Orders (From admission, onward)    Start     Ordered   05/17/20 1519  Diet heart healthy/carb modified Room service appropriate? Yes; Fluid consistency: Thin  Diet effective now       Question Answer Comment  Diet-HS Snack? Nothing   Room service appropriate? Yes   Fluid consistency: Thin      05/17/20 1518            Code Status: Full Code    Subjective 05/19/20    Patient says legs feeling little better, still hurt.  No jerking movements.  Anxious to get out of the hospital.  Disposition Plan & Communication   Status is: Inpatient  Inpatient appropriate due to severity of illness with recurrent hypokalemia despite replacement.  Pending SNF placement.  Dispo: The patient is from: Home              Anticipated d/c is to: SNF              Patient currently not medically stable for discharge   Difficult to place patient now   Family Communication: None at bedside on rounds today, will attempt to call   Consults, Procedures, Significant Events   Consultants:   Gastroenterology  Neurology  Procedures:   Flex  sigmoidoscopy is planned 2/27  Antimicrobials:  Anti-infectives (From admission, onward)   None        Micro    Objective   Vitals:   05/19/20 1040 05/19/20 1400 05/19/20 1600 05/19/20 1937  BP: (!) 142/59 (!) 156/63 (!) 149/68 (!) 153/67  Pulse: 60 60 60 60  Resp: (!) 21 18 16 16   Temp: 98.2 F (36.8 C) 98 F (36.7 C) 98.3 F (36.8 C) 97.7 F (36.5 C)  TempSrc: Oral Oral Oral Oral  SpO2: 97% 100% 98% 97%  Weight:      Height:        Intake/Output Summary (Last 24 hours) at 05/19/2020 2003 Last data filed at 05/19/2020 1400 Gross per 24 hour  Intake 0 ml  Output 2050 ml  Net -2050 ml   Filed Weights   05/16/20 0323 05/17/20 0327 05/18/20 0340  Weight: 87.7 kg 87.2 kg 89.2 kg    Physical Exam:  General exam: awake, alert, no acute distress Respiratory system: CTAB, no wheezes or rhonchi, normal respiratory effort, on room air Cardiovascular system: RRR, no peripheral edema.   Central nervous system: no myoclonus or asterixis, A&O x 3. normal speech Extremities: moves all, no edema, normal tone   Labs   Data Reviewed: I have personally reviewed following labs and imaging studies  CBC: Recent Labs  Lab 05/15/20 1606 05/15/20 2109 05/16/20 0449 05/16/20 0809 05/16/20 1418 05/16/20 2113 05/17/20  5366 05/18/20 0424 05/19/20 0500  WBC 5.2  --  3.2*  --   --   --  3.5* 4.1 5.1  HGB 11.7*   < > 9.8*   < > 10.1* 10.6* 10.6* 9.4* 10.2*  HCT 36.1   < > 31.2*   < > 31.4* 33.9* 31.4* 28.7* 30.8*  MCV 94.5  --  97.2  --   --   --  92.6 95.3 95.1  PLT 209  --  158  --   --   --  177 180 190   < > = values in this interval not displayed.   Basic Metabolic Panel: Recent Labs  Lab 05/15/20 1606 05/15/20 1643 05/16/20 0449 05/16/20 0809 05/17/20 0553 05/18/20 0424 05/19/20 0500  NA 133*  --  140  --  142 140 140  K <2.0*  --  3.0* 3.1* 3.8 2.8* 4.2  CL 77*  --  92*  --  97* 100 106  CO2 37*  --  29  --  34* 33* 29  GLUCOSE 251*  --  105*  --  72 79 121*   BUN 37*  --  35*  --  28* 15 13  CREATININE 2.43*  --  2.18*  --  1.52* 0.83 0.86  CALCIUM 9.4  --  8.7*  --  9.1 8.7* 9.1  MG  --  2.4  --   --  2.5*  --   --    GFR: Estimated Creatinine Clearance: 57.7 mL/min (by C-G formula based on SCr of 0.86 mg/dL). Liver Function Tests: Recent Labs  Lab 05/15/20 1606 05/16/20 0809 05/17/20 0553 05/18/20 0424 05/19/20 0500  AST 166* 123* 145* 132* 117*  ALT 139* 103* 119* 111* 102*  ALKPHOS 96 66 64 57 57  BILITOT 0.9 0.6 0.7 0.6 0.6  PROT 8.2* 6.5 5.9* 5.5* 6.0*  ALBUMIN 4.4 3.3* 3.1* 2.9* 3.1*   No results for input(s): LIPASE, AMYLASE in the last 168 hours. Recent Labs  Lab 05/17/20 1652  AMMONIA 20   Coagulation Profile: Recent Labs  Lab 05/15/20 2109  INR 1.2   Cardiac Enzymes: No results for input(s): CKTOTAL, CKMB, CKMBINDEX, TROPONINI in the last 168 hours. BNP (last 3 results) No results for input(s): PROBNP in the last 8760 hours. HbA1C: No results for input(s): HGBA1C in the last 72 hours. CBG: Recent Labs  Lab 05/18/20 1738 05/18/20 2107 05/19/20 0739 05/19/20 1140 05/19/20 1629  GLUCAP 98 119* 97 93 84   Lipid Profile: No results for input(s): CHOL, HDL, LDLCALC, TRIG, CHOLHDL, LDLDIRECT in the last 72 hours. Thyroid Function Tests: No results for input(s): TSH, T4TOTAL, FREET4, T3FREE, THYROIDAB in the last 72 hours. Anemia Panel: Recent Labs    05/17/20 1652  FERRITIN 114  TIBC 349  IRON 48   Sepsis Labs: No results for input(s): PROCALCITON, LATICACIDVEN in the last 168 hours.  Recent Results (from the past 240 hour(s))  Resp Panel by RT-PCR (Flu A&B, Covid) Nasopharyngeal Swab     Status: None   Collection Time: 05/15/20  3:12 PM   Specimen: Nasopharyngeal Swab; Nasopharyngeal(NP) swabs in vial transport medium  Result Value Ref Range Status   SARS Coronavirus 2 by RT PCR NEGATIVE NEGATIVE Final    Comment: (NOTE) SARS-CoV-2 target nucleic acids are NOT DETECTED.  The SARS-CoV-2 RNA  is generally detectable in upper respiratory specimens during the acute phase of infection. The lowest concentration of SARS-CoV-2 viral copies this assay can detect is 138 copies/mL.  A negative result does not preclude SARS-Cov-2 infection and should not be used as the sole basis for treatment or other patient management decisions. A negative result may occur with  improper specimen collection/handling, submission of specimen other than nasopharyngeal swab, presence of viral mutation(s) within the areas targeted by this assay, and inadequate number of viral copies(<138 copies/mL). A negative result must be combined with clinical observations, patient history, and epidemiological information. The expected result is Negative.  Fact Sheet for Patients:  EntrepreneurPulse.com.au  Fact Sheet for Healthcare Providers:  IncredibleEmployment.be  This test is no t yet approved or cleared by the [place] FDA and  has been authorized for detection and/or diagnosis of SARS-CoV-2 by FDA under an Emergency Use Authorization (EUA). This EUA will remain  in effect (meaning this test can be used) for the duration of the COVID-19 declaration under Section 564(b)(1) of the Act, 21 U.S.C.section 360bbb-3(b)(1), unless the authorization is terminated  or revoked sooner.       Influenza A by PCR NEGATIVE NEGATIVE Final   Influenza B by PCR NEGATIVE NEGATIVE Final    Comment: (NOTE) The Xpert Xpress SARS-CoV-2/FLU/RSV plus assay is intended as an aid in the diagnosis of influenza from Nasopharyngeal swab specimens and should not be used as a sole basis for treatment. Nasal washings and aspirates are unacceptable for Xpert Xpress SARS-CoV-2/FLU/RSV testing.  Fact Sheet for Patients: EntrepreneurPulse.com.au  Fact Sheet for Healthcare Providers: IncredibleEmployment.be  This test is not yet approved or cleared by the  [place] FDA and has been authorized for detection and/or diagnosis of SARS-CoV-2 by FDA under an Emergency Use Authorization (EUA). This EUA will remain in effect (meaning this test can be used) for the duration of the COVID-19 declaration under Section 564(b)(1) of the Act, 21 U.S.C. section 360bbb-3(b)(1), unless the authorization is terminated or revoked.  Performed at [facility], [address] [place], [place] [address]       Imaging Studies   No results found.   Medications   Scheduled Meds: . cholecalciferol  1,000 Units Oral Daily  . donepezil  10 mg Oral QHS  . ferrous sulfate  325 mg Oral BID WC  . gabapentin  200 mg Oral TID  . hydrOXYzine  25 mg Oral BID  . insulin aspart  0-9 Units Subcutaneous TID PC & HS  . insulin detemir  5 Units Subcutaneous QHS  . levothyroxine  125 mcg Oral Daily  . linaclotide  72 mcg Oral q morning  . loratadine  10 mg Oral Daily  . metoprolol succinate  12.5 mg Oral QHS  . montelukast  10 mg Oral QHS  . multivitamin with minerals  1 tablet Oral Daily  . OLANZapine  2.5 mg Oral BID  . pantoprazole  40 mg Oral Daily  . potassium chloride  40 mEq Oral BID  . sertraline  100 mg Oral BID  . traZODone  100 mg Oral QHS  . vitamin B-12  1,000 mcg Oral Daily  . vitamin E  400 Units Oral Daily   Continuous Infusions:      LOS: 4 days    Time spent: 25 minutes with greater than 50% spent at bedside and in coordination of care.    Ezekiel Slocumb, DO [facility]  05/19/2020, 8:03 PM      If 7PM-7AM, please contact night-coverage. How to contact the [facility] Attending or Consulting provider [place] or covering provider during after hours [place], for this patient?  1. Check the care team in [facility] and look for a) attending/consulting TRH provider listed and b) the [facility] team listed 2. Log into [email or web address] and use [place]'s universal password to access. If you do not have the password, please contact  the hospital operator. 3. Locate the [facility] provider you are looking for under [facility] and page to a number that you can be directly reached. 4. If you still have difficulty reaching the provider, please page the [facility] (Director on Call) for the Hospitalists listed on amion for assistance.

## 2020-05-19 NOTE — TOC Progression Note (Signed)
Transition of Care [facility]) - Progression Note    Patient Details  Name: Khylah Kendra MRN: [id number] Date of Birth: July 04, 1940  Transition of [facility]) CM/SW Contact  Beverly Sessions, RN Phone Number: 05/19/2020, 10:56 AM  Clinical Narrative:    Presented offers to patient.  She request that I call her daughter Clarene Critchley to present offers and let her accept.  Clarene Critchley has accepted bed at [facility].    Accepted bed at [facility].  Notified MD Accepted bed in HUB and notified [facility] and [facility]   Expected Discharge Plan: [place] Barriers to Discharge: Continued Medical Work up  Expected Discharge Plan and Services Expected Discharge Plan: [place]   Discharge Planning Services: CM Consult Post Acute Care Choice: [facility] Living arrangements for the past 2 months: Single Family Home                 DME Arranged: N/A DME Agency: NA       HH Arranged: NA HH Agency: NA         Social Determinants of Health (SDOH) Interventions    Readmission Risk Interventions Readmission Risk Prevention Plan 05/16/2020 01/30/2020 10/24/2019  Transportation Screening Complete Complete Complete  Medication Review Press photographer) Complete Complete Complete  PCP or Specialist appointment within 3-5 days of discharge Complete Complete Complete  HRI or Home Care Consult Complete Complete -  SW Recovery Care/Counseling Consult Complete Complete Complete  Palliative Care Screening Not Applicable Not Applicable Not Applicable  Skilled Nursing Facility Complete Not Applicable Not Applicable  Some recent data might be hidden

## 2020-05-20 DIAGNOSIS — N179 Acute kidney failure, unspecified: Secondary | ICD-10-CM | POA: Diagnosis not present

## 2020-05-20 DIAGNOSIS — Z794 Long term (current) use of insulin: Secondary | ICD-10-CM

## 2020-05-20 DIAGNOSIS — R197 Diarrhea, unspecified: Secondary | ICD-10-CM | POA: Diagnosis not present

## 2020-05-20 DIAGNOSIS — R531 Weakness: Secondary | ICD-10-CM

## 2020-05-20 DIAGNOSIS — E876 Hypokalemia: Secondary | ICD-10-CM | POA: Diagnosis not present

## 2020-05-20 DIAGNOSIS — E11649 Type 2 diabetes mellitus with hypoglycemia without coma: Secondary | ICD-10-CM

## 2020-05-20 DIAGNOSIS — G253 Myoclonus: Secondary | ICD-10-CM

## 2020-05-20 DIAGNOSIS — I4819 Other persistent atrial fibrillation: Secondary | ICD-10-CM

## 2020-05-20 LAB — COMPREHENSIVE METABOLIC PANEL
ALT: 110 U/L — ABNORMAL HIGH (ref 0–44)
AST: 133 U/L — ABNORMAL HIGH (ref 15–41)
Albumin: 3.2 g/dL — ABNORMAL LOW (ref 3.5–5.0)
Alkaline Phosphatase: 63 U/L (ref 38–126)
Anion gap: 6 (ref 5–15)
BUN: 11 mg/dL (ref 8–23)
CO2: 27 mmol/L (ref 22–32)
Calcium: 9.3 mg/dL (ref 8.9–10.3)
Chloride: 106 mmol/L (ref 98–111)
Creatinine, Ser: 0.77 mg/dL (ref 0.44–1.00)
GFR, Estimated: >60 mL/min (ref >60)
Glucose, Bld: 70 mg/dL (ref 70–99)
Potassium: 4.5 mmol/L (ref 3.5–5.1)
Sodium: 139 mmol/L (ref 135–145)
Total Bilirubin: 0.7 mg/dL (ref 0.3–1.2)
Total Protein: 6.3 g/dL — ABNORMAL LOW (ref 6.5–8.1)

## 2020-05-20 LAB — GLUCOSE, CAPILLARY
Glucose-Capillary: 123 mg/dL — ABNORMAL HIGH (ref 70–99)
Glucose-Capillary: 62 mg/dL — ABNORMAL LOW (ref 70–99)
Glucose-Capillary: 68 mg/dL — ABNORMAL LOW (ref 70–99)
Glucose-Capillary: 87 mg/dL (ref 70–99)
Glucose-Capillary: 146 mg/dL — ABNORMAL HIGH (ref 70–99)
Glucose-Capillary: 151 mg/dL — ABNORMAL HIGH (ref 70–99)
Glucose-Capillary: 124 mg/dL — ABNORMAL HIGH (ref 70–99)

## 2020-05-20 LAB — HEMOGLOBIN AND HEMATOCRIT, BLOOD
HCT: 32.1 % — ABNORMAL LOW (ref 36.0–46.0)
Hemoglobin: 10.2 g/dL — ABNORMAL LOW (ref 12.0–15.0)

## 2020-05-20 LAB — SARS CORONAVIRUS 2 (TAT 6-24 HRS): SARS Coronavirus 2: NEGATIVE

## 2020-05-20 MED ORDER — CHOLESTYRAMINE 4 G PO PACK
4.0000 g | PACK | Freq: Two times a day (BID) | ORAL | Status: DC
  Administered 2020-05-20 – 2020-05-21 (×3): 4 g via ORAL
  Filled 2020-05-20 (×4): qty 1

## 2020-05-20 NOTE — Progress Notes (Signed)
Patient ID: Kristi [name], female   DOB: 1940-05-17, 80 y.o.   MRN: [id number] [facility] PROGRESS NOTE  Zooey Schreurs IWP:[id number] DOB: [birth year] DOA: 05/15/2020 PCP: Martin Majestic, FNP  HPI/Subjective: Patient states that she had 4 episodes of diarrhea yesterday and one episode this morning.  Falls asleep easily when I am talking with her.  Had a relatively low sugar this morning at 62.  Initially admitted with rectal bleeding.  Objective: Vitals:   05/20/20 1015 05/20/20 1112  BP: (!) 151/61 (!) 146/66  Pulse: 64 (!) 59  Resp:  18  Temp:  97.9 F (36.6 C)  SpO2: 95% 96%    Intake/Output Summary (Last 24 hours) at 05/20/2020 1526 Last data filed at 05/20/2020 1429 Gross per 24 hour  Intake -  Output 1750 ml  Net -1750 ml   Filed Weights   05/16/20 0323 05/17/20 0327 05/18/20 0340  Weight: 87.7 kg 87.2 kg 89.2 kg    ROS: Review of Systems  Respiratory: Negative for shortness of breath.   Cardiovascular: Negative for chest pain.  Gastrointestinal: Positive for diarrhea. Negative for abdominal pain, nausea and vomiting.   Exam: Physical Exam HENT:     Head: Normocephalic.     Mouth/Throat:     Pharynx: No oropharyngeal exudate.  Eyes:     General: Lids are normal.     Conjunctiva/sclera: Conjunctivae normal.     Pupils: Pupils are equal, round, and reactive to light.  Cardiovascular:     Rate and Rhythm: Normal rate and regular rhythm.     Heart sounds: Normal heart sounds, S1 normal and S2 normal.  Pulmonary:     Breath sounds: No decreased breath sounds, wheezing, rhonchi or rales.  Abdominal:     Palpations: Abdomen is soft.     Tenderness: There is no abdominal tenderness.  Musculoskeletal:     Right lower leg: Swelling present.     Left lower leg: Swelling present.  Skin:    General: Skin is warm.     Findings: No rash.  Neurological:     Mental Status: She is alert.     Comments: Answers some questions but then went back to sleep.        Data Reviewed: Basic Metabolic Panel: Recent Labs  Lab 05/15/20 1643 05/16/20 0449 05/16/20 0809 05/17/20 0553 05/18/20 0424 05/19/20 0500 05/20/20 0431  NA  --  140  --  142 140 140 139  K  --  3.0* 3.1* 3.8 2.8* 4.2 4.5  CL  --  92*  --  97* 100 106 106  CO2  --  29  --  34* 33* 29 27  GLUCOSE  --  105*  --  72 79 121* 70  BUN  --  35*  --  28* 15 13 11   CREATININE  --  2.18*  --  1.52* 0.83 0.86 0.77  CALCIUM  --  8.7*  --  9.1 8.7* 9.1 9.3  MG 2.4  --   --  2.5*  --   --   --    Liver Function Tests: Recent Labs  Lab 05/16/20 0809 05/17/20 0553 05/18/20 0424 05/19/20 0500 05/20/20 0431  AST 123* 145* 132* 117* 133*  ALT 103* 119* 111* 102* 110*  ALKPHOS 66 64 57 57 63  BILITOT 0.6 0.7 0.6 0.6 0.7  PROT 6.5 5.9* 5.5* 6.0* 6.3*  ALBUMIN 3.3* 3.1* 2.9* 3.1* 3.2*    Recent Labs  Lab 05/17/20 1652  AMMONIA 20  CBC: Recent Labs  Lab 05/15/20 1606 05/15/20 2109 05/16/20 0449 05/16/20 0809 05/16/20 2113 05/17/20 0553 05/18/20 0424 05/19/20 0500 05/20/20 0431  WBC 5.2  --  3.2*  --   --  3.5* 4.1 5.1  --   HGB 11.7*   < > 9.8*   < > 10.6* 10.6* 9.4* 10.2* 10.2*  HCT 36.1   < > 31.2*   < > 33.9* 31.4* 28.7* 30.8* 32.1*  MCV 94.5  --  97.2  --   --  92.6 95.3 95.1  --   PLT 209  --  158  --   --  177 180 190  --    < > = values in this interval not displayed.   BNP (last 3 results) Recent Labs    09/27/19 0534 09/28/19 0607 09/29/19 0808  BNP 101.6* 486.7* 359.9*     CBG: Recent Labs  Lab 05/19/20 2115 05/20/20 0732 05/20/20 0751 05/20/20 0820 05/20/20 1114  GLUCAP 109* 62* 68* 123* 87    Recent Results (from the past 240 hour(s))  Resp Panel by RT-PCR (Flu A&B, Covid) Nasopharyngeal Swab     Status: None   Collection Time: 05/15/20  3:12 PM   Specimen: Nasopharyngeal Swab; Nasopharyngeal(NP) swabs in vial transport medium  Result Value Ref Range Status   SARS Coronavirus 2 by RT PCR NEGATIVE NEGATIVE Final    Comment:  (NOTE) SARS-CoV-2 target nucleic acids are NOT DETECTED.  The SARS-CoV-2 RNA is generally detectable in upper respiratory specimens during the acute phase of infection. The lowest concentration of SARS-CoV-2 viral copies this assay can detect is 138 copies/mL. A negative result does not preclude SARS-Cov-2 infection and should not be used as the sole basis for treatment or other patient management decisions. A negative result may occur with  improper specimen collection/handling, submission of specimen other than nasopharyngeal swab, presence of viral mutation(s) within the areas targeted by this assay, and inadequate number of viral copies(<138 copies/mL). A negative result must be combined with clinical observations, patient history, and epidemiological information. The expected result is Negative.  Fact Sheet for Patients:  EntrepreneurPulse.com.au  Fact Sheet for Healthcare Providers:  IncredibleEmployment.be  This test is no t yet approved or cleared by the [place] FDA and  has been authorized for detection and/or diagnosis of SARS-CoV-2 by FDA under an Emergency Use Authorization (EUA). This EUA will remain  in effect (meaning this test can be used) for the duration of the COVID-19 declaration under Section 564(b)(1) of the Act, 21 U.S.C.section 360bbb-3(b)(1), unless the authorization is terminated  or revoked sooner.       Influenza A by PCR NEGATIVE NEGATIVE Final   Influenza B by PCR NEGATIVE NEGATIVE Final    Comment: (NOTE) The Xpert Xpress SARS-CoV-2/FLU/RSV plus assay is intended as an aid in the diagnosis of influenza from Nasopharyngeal swab specimens and should not be used as a sole basis for treatment. Nasal washings and aspirates are unacceptable for Xpert Xpress SARS-CoV-2/FLU/RSV testing.  Fact Sheet for Patients: EntrepreneurPulse.com.au  Fact Sheet for Healthcare  Providers: IncredibleEmployment.be  This test is not yet approved or cleared by the [place] FDA and has been authorized for detection and/or diagnosis of SARS-CoV-2 by FDA under an Emergency Use Authorization (EUA). This EUA will remain in effect (meaning this test can be used) for the duration of the COVID-19 declaration under Section 564(b)(1) of the Act, 21 U.S.C. section 360bbb-3(b)(1), unless the authorization is terminated or revoked.  Performed at [facility], [address] [place], [place] [address]       Scheduled Meds: . amiodarone  200 mg Oral Daily  . cholecalciferol  1,000 Units Oral Daily  . cholestyramine  4 g Oral BID  . donepezil  10 mg Oral QHS  . ferrous sulfate  325 mg Oral BID WC  . gabapentin  200 mg Oral TID  . hydrOXYzine  25 mg Oral BID  . insulin aspart  0-9 Units Subcutaneous TID PC & HS  . levothyroxine  125 mcg Oral Daily  . loratadine  10 mg Oral Daily  . memantine  5 mg Oral QHS  . metolazone  5 mg Oral Daily  . metoprolol succinate  12.5 mg Oral QHS  . montelukast  10 mg Oral QHS  . multivitamin with minerals  1 tablet Oral Daily  . pantoprazole  40 mg Oral Daily  . sertraline  100 mg Oral BID  . torsemide  40 mg Oral BID  . traZODone  100 mg Oral QHS  . vitamin B-12  1,000 mcg Oral Daily  . vitamin E  400 Units Oral Daily   Continuous Infusions:  Assessment/Plan:  1. Diarrhea.  Discontinue Linzess.  Increase cholestyramine to twice daily dosing.  Continue to watch here today.  Since the patient has been here 4 days unable to send stool studies at this time.  Watch for fever and check a white blood cell count tomorrow morning. 2. Type 2 diabetes mellitus with hyperlipidemia and diabetic neuropathy.  Patient had relative hypoglycemia this morning.  Hold Levemir.  Since A1c is 7.7 may be able to do oral medications upon disposition. 3. Hypokalemia.  Replaced. 4. 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   On lower dose gabapentin at this time. 5. Acute kidney injury on presentation with a creatinine of 2.43.  Today's creatinine 0.77. 6. Elevated liver function tests.  Continue to monitor.  May have to hold amiodarone if elevation of liver enzymes worsens. 7. Weakness.  Physical therapy recommending rehab 8. Paroxysmal atrial fibrillation status post cardioversion.  Holding Eliquis with rectal bleeding.  Stroke risk higher being off Eliquis.  Continue amiodarone for right now. 9. Chronic diastolic congestive heart failure.  No signs of heart failure currently 10. Parkinson's disease, dementia, depression anxiety insomnia.  Continue usual medications 11. Rectal bleeding.  Eliquis on hold.  Few angiectasia's were treated with argon plasma coagulation.  Hemoglobin stable.        Code Status:     Code Status Orders  (From admission, onward)         Start     Ordered   05/15/20 2010  Full code  Continuous        05/15/20 2021        Code Status History    Date Active Date Inactive Code Status Order ID Comments User Context   01/28/2020 2133 01/30/2020 2243 Full Code [id number]  CoxBriant Cedar, DO ED   10/23/2019 1401 10/28/2019 2246 Full Code [id number]  Max Sane, MD ED   09/25/2019 1138 09/30/2019 2324 Full Code [id number]  Collier Bullock, MD ED   09/05/2019 1035 09/05/2019 1658 Full Code [id number]  [name], MD Inpatient   09/05/2019 1034 09/05/2019 1035 Full Code [id number]  [name], MD Inpatient   11/20/2018 2158 11/21/2018 2040 DNR [id number]  Vaughan Basta, MD Inpatient   04/11/2017 0602 04/18/2017 1834 Full Code [id number]  Saundra Shelling, MD Inpatient   12/24/2016 0640 12/26/2016 2020 Full Code [id number]  Harrie Foreman, MD ED   11/17/2016 0450 11/17/2016 1954 Full Code [id number]  Saundra Shelling, MD Inpatient   09/29/2016 2002 09/30/2016 1605 Full Code [id number]  Epifanio Lesches, MD ED   Advance Care Planning Activity    Advance Directive Documentation   Flowsheet Row  Most Recent Value  Type of Advance Directive Healthcare Power of Attorney, Living will, Out of facility DNR (pink MOST or yellow form)  Pre-existing out of facility DNR order (yellow form or pink MOST form) -  "MOST" Form in Place? -     Family Communication: Spoke with daughter on the phone Disposition Plan: Status is: Inpatient  Dispo: The patient is from: Home              Anticipated d/c is to: Rehab              Patient currently had too much diarrhea yesterday and again this morning 1 watch today and reassess tomorrow for potential disposition   Difficult to place patient.  No.  Time spent: 28 minutes  [place]

## 2020-05-20 NOTE — TOC Progression Note (Signed)
Transition of Care [facility]) - Progression Note    Patient Details  Name: Kristi [name] MRN: [id number] Date of Birth: March 02, 1941  [facility] CM/SW Contact  Beverly Sessions, RN Phone Number: 05/20/2020, 8:59 AM  Clinical Narrative:     Per MD potential DC tomorrow to [facility] notified Will need repeat covid test prior to discharge. MD aware   Expected Discharge Plan: [place] Barriers to Discharge: Continued Medical Work up  Expected Discharge Plan and Services Expected Discharge Plan: [place]   Discharge Planning Services: CM Consult Post Acute Care Choice: [place] Living arrangements for the past 2 months: Single Family Home                 DME Arranged: N/A DME Agency: NA       HH Arranged: NA HH Agency: NA         Social Determinants of Health (SDOH) Interventions    Readmission Risk Interventions Readmission Risk Prevention Plan 05/16/2020 01/30/2020 10/24/2019  Transportation Screening Complete Complete Complete  Medication Review Press photographer) Complete Complete Complete  PCP or Specialist appointment within 3-5 days of discharge Complete Complete Complete  HRI or Home Care Consult Complete Complete -  SW Recovery Care/Counseling Consult Complete Complete Complete  Palliative Care Screening Not Applicable Not Applicable Not Applicable  Skilled Nursing Facility Complete Not Applicable Not Applicable  Some recent data might be hidden

## 2020-05-21 DIAGNOSIS — R197 Diarrhea, unspecified: Secondary | ICD-10-CM | POA: Diagnosis not present

## 2020-05-21 DIAGNOSIS — E11649 Type 2 diabetes mellitus with hypoglycemia without coma: Secondary | ICD-10-CM | POA: Diagnosis not present

## 2020-05-21 DIAGNOSIS — G253 Myoclonus: Secondary | ICD-10-CM

## 2020-05-21 DIAGNOSIS — N179 Acute kidney failure, unspecified: Secondary | ICD-10-CM | POA: Diagnosis not present

## 2020-05-21 DIAGNOSIS — K625 Hemorrhage of anus and rectum: Secondary | ICD-10-CM | POA: Diagnosis not present

## 2020-05-21 LAB — COMPREHENSIVE METABOLIC PANEL
ALT: 111 U/L — ABNORMAL HIGH (ref 0–44)
AST: 126 U/L — ABNORMAL HIGH (ref 15–41)
Albumin: 3.6 g/dL (ref 3.5–5.0)
Alkaline Phosphatase: 76 U/L (ref 38–126)
Anion gap: 14 (ref 5–15)
BUN: 17 mg/dL (ref 8–23)
CO2: 31 mmol/L (ref 22–32)
Calcium: 9.4 mg/dL (ref 8.9–10.3)
Chloride: 93 mmol/L — ABNORMAL LOW (ref 98–111)
Creatinine, Ser: 1.15 mg/dL — ABNORMAL HIGH (ref 0.44–1.00)
GFR, Estimated: 48 mL/min — ABNORMAL LOW (ref >60)
Glucose, Bld: 151 mg/dL — ABNORMAL HIGH (ref 70–99)
Potassium: 3.2 mmol/L — ABNORMAL LOW (ref 3.5–5.1)
Sodium: 138 mmol/L (ref 135–145)
Total Bilirubin: 0.8 mg/dL (ref 0.3–1.2)
Total Protein: 7.4 g/dL (ref 6.5–8.1)

## 2020-05-21 LAB — CBC
HCT: 35.7 % — ABNORMAL LOW (ref 36.0–46.0)
Hemoglobin: 12 g/dL (ref 12.0–15.0)
MCH: 30.7 pg (ref 26.0–34.0)
MCHC: 33.6 g/dL (ref 30.0–36.0)
MCV: 91.3 fL (ref 80.0–100.0)
Platelets: 210 10*3/uL (ref 150–400)
RBC: 3.91 MIL/uL (ref 3.87–5.11)
RDW: 15.9 % — ABNORMAL HIGH (ref 11.5–15.5)
WBC: 5.9 10*3/uL (ref 4.0–10.5)
nRBC: 0 % (ref 0.0–0.2)

## 2020-05-21 LAB — GLUCOSE, CAPILLARY
Glucose-Capillary: 118 mg/dL — ABNORMAL HIGH (ref 70–99)
Glucose-Capillary: 191 mg/dL — ABNORMAL HIGH (ref 70–99)

## 2020-05-21 MED ORDER — HYDROCODONE-ACETAMINOPHEN 5-325 MG PO TABS: 1.0000 | ORAL_TABLET | Freq: Three times a day (TID) | ORAL | 0 refills | Status: DC | PRN

## 2020-05-21 MED ORDER — LINAGLIPTIN 5 MG PO TABS: 5.0000 mg | ORAL_TABLET | Freq: Every day | ORAL | 0 refills | Status: DC

## 2020-05-21 MED ORDER — SODIUM CHLORIDE 0.9 % IV BOLUS
250.0000 mL | Freq: Once | INTRAVENOUS | Status: AC
  Administered 2020-05-21: 250 mL via INTRAVENOUS

## 2020-05-21 MED ORDER — POTASSIUM CHLORIDE CRYS ER 20 MEQ PO TBCR: 20.0000 meq | EXTENDED_RELEASE_TABLET | Freq: Two times a day (BID) | ORAL | 0 refills | Status: DC

## 2020-05-21 MED ORDER — POTASSIUM CHLORIDE CRYS ER 20 MEQ PO TBCR
40.0000 meq | EXTENDED_RELEASE_TABLET | Freq: Once | ORAL | Status: AC
  Administered 2020-05-21: 40 meq via ORAL
  Filled 2020-05-21: qty 2

## 2020-05-21 MED ORDER — ACETAMINOPHEN 325 MG PO TABS: 650.0000 mg | ORAL_TABLET | Freq: Four times a day (QID) | ORAL | Status: DC | PRN

## 2020-05-21 MED ORDER — AMIODARONE HCL 100 MG PO TABS: 100.0000 mg | ORAL_TABLET | Freq: Every day | ORAL | 0 refills | Status: DC

## 2020-05-21 MED ORDER — GABAPENTIN 100 MG PO CAPS: 200.0000 mg | ORAL_CAPSULE | Freq: Three times a day (TID) | ORAL | 0 refills | Status: DC

## 2020-05-21 MED ORDER — HYDROCORTISONE ACETATE 25 MG RE SUPP: 25.0000 mg | Freq: Two times a day (BID) | RECTAL | 0 refills | Status: DC | PRN

## 2020-05-21 MED ORDER — AMIODARONE HCL 200 MG PO TABS
100.0000 mg | ORAL_TABLET | Freq: Every day | ORAL | Status: DC
  Administered 2020-05-21: 100 mg via ORAL
  Filled 2020-05-21: qty 1

## 2020-05-21 NOTE — Discharge Summary (Addendum)
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] Lower GI bleed [K92.2] AKI (acute kidney injury) ([place]) [N17.9] Type 2 diabetes mellitus with hyperlipidemia ([place]) [E11.69, E78.5]  DISCHARGE DIAGNOSIS:  Principal Problem:   Rectal bleed Active Problems:   Lumbar radiculopathy (Multilevel) (Bilateral)   Anxiety and depression   CAD (coronary artery disease)   Frequent falls   GERD (gastroesophageal reflux disease)   Chronic pain syndrome   Parkinson disease (HCC)   Essential hypertension   Hypothyroidism   Mixed hyperlipidemia   Hypokalemia   Type 2 diabetes mellitus with hypoglycemia without coma, with long-term current use of insulin (HCC)   Atrial fibrillation status post cardioversion (HCC)   AKI (acute kidney injury) ([place])   Transaminitis   Diarrhea   Myoclonus   Weakness   SECONDARY DIAGNOSIS:   Past Medical History:  Diagnosis Date  . Anxiety   . Arthritis   . Broken arm    left FA 3/17  . Broken arm    left  . CAD (coronary artery disease)    s/p MI  . Cancer ([place])    uterine  . Cervical disc disorder   . Depression   . Diabetes mellitus without complication ([place])   . GERD (gastroesophageal reflux disease)   . Headache   . Hyperlipidemia   . Hypertension   . Hypothyroid   . Myocardial infarction ([place])    15 year ago  . Neck pain 06/04/2014  . Parkinson's disease ([place])   . Presence of permanent cardiac pacemaker   . Spinal stenosis     HOSPITAL COURSE:   1.  Rectal bleeding.  Eliquis currently on hold.  Can consider restarting Eliquis and 1 week if no further bleeding.  Patient  had a few angiectasia's on flexible sigmoidoscopy that were treated with argon plasma coagulation.  Hemoglobin is stable and actually increased.  Last hemoglobin 12. 2.  Diarrhea.  I discontinued Linzess.  Continue cholestyramine.  Since the patient was here over 4 days I did not send any stool studies.  Continue to monitor as outpatient. 3.  Type 2 diabetes mellitus with hyperlipidemia and diabetic neuropathy.  Patient had relative hypoglycemia yesterday.  I discontinued Levemir.  Since hemoglobin A1c 7.7 I will do oral Tradjenta upon discharge.  Check fingersticks before every meal and nightly. 4.  Hypokalemia.  Continue oral replacement 20 mEq twice daily. 5.  Myoclonus and stress test on presentation.  Likely secondary to gabapentin and worsening creatinine.  Patient back on lower dose gabapentin at this time. 6.  Acute kidney injury on presentation with a creatinine of 2.43.  Today's creatinine 1.15.  I will hold Zaroxolyn and give to 250 mL bolus.  Patient also on torsemide twice daily.  Follow kidney function closely as outpatient. 7.  Elevated liver function test.  Recommend checking liver function test in 1 week.  Lower dose of amiodarone 200 mg daily. 8.  Paroxysmal atrial fibrillation.  Had cardioversion in the past.  Holding Eliquis with rectal bleeding currently.  Consider restarting Eliquis in 1 week.  Stroke risk is higher being off Eliquis.  Lower dose of amiodarone with  elevated liver function test 100 mg daily..  Continue Toprol-XL. 9.  Chronic diastolic congestive heart failure.  No signs of heart failure currently.  Daily weights. 10.  Parkinson's disease, dementia, depression anxiety and insomnia.  Continue usual medications except for Zyprexa. 11.  Weakness.  Physical therapy recommends rehab  Recommend checking a comprehensive metabolic panel and a hemoglobin in a few days.  DISCHARGE CONDITIONS:   Follow-up team at rehab 1 week  CONSULTS OBTAINED:  Gastroenterology  DRUG  ALLERGIES:   Allergies  Allergen Reactions  . Penicillins Anaphylaxis, Swelling and Rash    TOLERATES CEFTRIAXONE    DISCHARGE MEDICATIONS:   Allergies as of 05/21/2020      Reactions   Penicillins Anaphylaxis, Swelling, Rash   TOLERATES CEFTRIAXONE      Medication List    STOP taking these medications   apixaban 5 MG Tabs tablet Commonly known as: ELIQUIS   FLUoxetine 10 MG capsule Commonly known as: PROZAC   insulin detemir 100 UNIT/ML FlexPen Commonly known as: LEVEMIR   Insulin Pen Needle 31G X 8 MM Misc   Linzess 72 MCG capsule Generic drug: linaclotide   liver oil-zinc oxide 40 % ointment Commonly known as: DESITIN   metolazone 5 MG tablet Commonly known as: ZAROXOLYN   OLANZapine 2.5 MG tablet Commonly known as: ZYPREXA   pioglitazone 30 MG tablet Commonly known as: ACTOS   rosuvastatin 40 MG tablet Commonly known as: CRESTOR   silver sulfADIAZINE 1 % cream Commonly known as: SILVADENE   traMADol 50 MG tablet Commonly known as: Ultram     TAKE these medications   acetaminophen 325 MG tablet Commonly known as: TYLENOL Take 2 tablets (650 mg total) by mouth every 6 (six) hours as needed for mild pain (or Fever >/= 101).   albuterol 108 (90 Base) MCG/ACT inhaler Commonly known as: VENTOLIN HFA Inhale 2 puffs into the lungs every 6 (six) hours as needed for wheezing or shortness of breath.   amiodarone 100 MG tablet Commonly known as: PACERONE Take 1 tablet (100 mg total) by mouth daily. What changed:   medication strength  how much to take   cholecalciferol 25 MCG (1000 UNIT) tablet Commonly known as: VITAMIN D3 Take 1,000 Units by mouth daily.   cholestyramine 4 g packet Commonly known as: Questran Take 1 packet (4 g total) by mouth daily. in water   donepezil 10 MG tablet Commonly known as: ARICEPT Take 10 mg by mouth daily.   gabapentin 100 MG capsule Commonly known as: NEURONTIN Take 2 capsules (200 mg total) by mouth 3  (three) times daily. What changed:   medication strength  how much to take  additional instructions   HYDROcodone-acetaminophen 5-325 MG tablet Commonly known as: NORCO/VICODIN Take 1 tablet by mouth every 8 (eight) hours as needed for moderate pain or severe pain. What changed:   when to take this  reasons to take this   hydrocortisone 25 MG suppository Commonly known as: ANUSOL-HC Place 1 suppository (25 mg total) rectally 2 (two) times daily as needed for hemorrhoids or anal itching. Okay to substitute generic What changed:   when to take this  reasons to take this   hydrOXYzine 25 MG tablet Commonly known as: ATARAX/VISTARIL Take 25 mg by mouth 2 (two) times daily.   levothyroxine 125 MCG tablet Commonly known as: SYNTHROID Take 125 mcg by mouth daily.   linagliptin 5 MG Tabs tablet Commonly known as: TRADJENTA Take 1 tablet (5 mg total) by mouth daily.  loratadine 10 MG tablet Commonly known as: CLARITIN Take 10 mg by mouth daily.   meclizine 12.5 MG tablet Commonly known as: ANTIVERT Take 1 tablet (12.5 mg total) by mouth 2 (two) times daily as needed for dizziness.   memantine 5 MG tablet Commonly known as: NAMENDA Take 5 mg by mouth at bedtime.   metoprolol succinate 25 MG 24 hr tablet Commonly known as: TOPROL-XL Take 0.5 tablets (12.5 mg total) by mouth at bedtime.   montelukast 10 MG tablet Commonly known as: SINGULAIR Take 10 mg by mouth at bedtime.   multivitamin with minerals Tabs tablet Take 1 tablet by mouth daily.   nitroGLYCERIN 0.4 MG SL tablet Commonly known as: NITROSTAT Place 0.4 mg under the tongue every 5 (five) minutes as needed for chest pain.   omeprazole 40 MG capsule Commonly known as: PRILOSEC Take 40 mg by mouth 2 (two) times daily.   potassium chloride SA 20 MEQ tablet Commonly known as: KLOR-CON Take 1 tablet (20 mEq total) by mouth 2 (two) times daily. What changed:   how much to take  when to take this    sertraline 100 MG tablet Commonly known as: ZOLOFT Take 100 mg by mouth 2 (two) times daily.   Slow Fe 142 (45 Fe) MG Tbcr Generic drug: Ferrous Sulfate Take 1 tablet by mouth twice daily   torsemide 20 MG tablet Commonly known as: DEMADEX Take 2 tablets (40 mg total) by mouth 2 (two) times daily.   traZODone 100 MG tablet Commonly known as: DESYREL Take 1 tablet (100 mg total) by mouth at bedtime.   vitamin B-12 1000 MCG tablet Commonly known as: CYANOCOBALAMIN Take 1,000 mcg by mouth daily.   vitamin E 180 MG (400 UNITS) capsule Take 400 Units by mouth daily.        DISCHARGE INSTRUCTIONS:   Follow-up team at rehab 1 day Follow-up cardiology 2 weeks  If you experience worsening of your admission symptoms, develop shortness of breath, life threatening emergency, suicidal or homicidal thoughts you must seek medical attention immediately by calling 911 or calling your MD immediately  if symptoms less severe.  You Must read complete instructions/literature along with all the possible adverse reactions/side effects for all the Medicines you take and that have been prescribed to you. Take any new Medicines after you have completely understood and accept all the possible adverse reactions/side effects.   Please note  You were cared for by a hospitalist during your hospital stay. If you have any questions about your discharge medications or the care you received while you were in the hospital after you are discharged, you can call the unit and asked to speak with the hospitalist on call if the hospitalist that took care of you is not available. Once you are discharged, your primary care physician will handle any further medical issues. Please note that NO REFILLS for any discharge medications will be authorized once you are discharged, as it is imperative that you return to your primary care physician (or establish a relationship with a primary care physician if you do not have one)  for your aftercare needs so that they can reassess your need for medications and monitor your lab values.    Today   CHIEF COMPLAINT:   Chief Complaint  Patient presents with  . uncontrollable jerking movements    HISTORY OF PRESENT ILLNESS:  Kristi [name]  is a 80 y.o. female came in with uncontrollable jerking movements and rectal bleeding   VITAL  SIGNS:  Blood pressure 132/67, pulse 60, temperature 97.8 F (36.6 C), temperature source Oral, resp. rate 18, height 5\' 6"  (1.676 m), weight 89.2 kg, SpO2 95 %.  I/O:    Intake/Output Summary (Last 24 hours) at 05/21/2020 0839 Last data filed at 05/20/2020 2300 Gross per 24 hour  Intake --  Output 3550 ml  Net -3550 ml    PHYSICAL EXAMINATION:  GENERAL:  80 y.o.-year-old patient lying in the bed with no acute distress.  EYES: Pupils equal, round, reactive to light and accommodation. No scleral icterus. HEENT: Head atraumatic, normocephalic. Oropharynx and nasopharynx clear.   LUNGS: Normal breath sounds bilaterally, no wheezing, rales,rhonchi or crepitation. No use of accessory muscles of respiration.  CARDIOVASCULAR: S1, S2 normal. No murmurs, rubs, or gallops.  ABDOMEN: Soft, non-tender, non-distended. Bowel sounds present. No organomegaly or mass.  EXTREMITIES: Trace pedal edema, no cyanosis, or clubbing.  NEUROLOGIC: Cranial nerves II through XII are intact. Muscle strength 5/5 in all extremities. Sensation intact. Gait not checked.  PSYCHIATRIC: The patient is alert and answers questions appropriately.  SKIN: No obvious rash, lesion, or ulcer.   DATA REVIEW:   CBC Recent Labs  Lab 05/21/20 0452  WBC 5.9  HGB 12.0  HCT 35.7*  PLT 210    Chemistries  Recent Labs  Lab 05/17/20 0553 05/18/20 0424 05/21/20 0452  NA 142   < > 138  K 3.8   < > 3.2*  CL 97*   < > 93*  CO2 34*   < > 31  GLUCOSE 72   < > 151*  BUN 28*   < > 17  CREATININE 1.52*   < > 1.15*  CALCIUM 9.1   < > 9.4  MG 2.5*  --   --   AST 145*    < > 126*  ALT 119*   < > 111*  ALKPHOS 64   < > 76  BILITOT 0.7   < > 0.8   < > = values in this interval not displayed.    Microbiology Results  Results for orders placed or performed during the hospital encounter of 05/15/20  Resp Panel by RT-PCR (Flu A&B, Covid) Nasopharyngeal Swab     Status: None   Collection Time: 05/15/20  3:12 PM   Specimen: Nasopharyngeal Swab; Nasopharyngeal(NP) swabs in vial transport medium  Result Value Ref Range Status   SARS Coronavirus 2 by RT PCR NEGATIVE NEGATIVE Final    Comment: (NOTE) SARS-CoV-2 target nucleic acids are NOT DETECTED.  The SARS-CoV-2 RNA is generally detectable in upper respiratory specimens during the acute phase of infection. The lowest concentration of SARS-CoV-2 viral copies this assay can detect is 138 copies/mL. A negative result does not preclude SARS-Cov-2 infection and should not be used as the sole basis for treatment or other patient management decisions. A negative result may occur with  improper specimen collection/handling, submission of specimen other than nasopharyngeal swab, presence of viral mutation(s) within the areas targeted by this assay, and inadequate number of viral copies(<138 copies/mL). A negative result must be combined with clinical observations, patient history, and epidemiological information. The expected result is Negative.  Fact Sheet for Patients:  EntrepreneurPulse.com.au  Fact Sheet for Healthcare Providers:  IncredibleEmployment.be  This test is no t yet approved or cleared by the [place] FDA and  has been authorized for detection and/or diagnosis of SARS-CoV-2 by FDA under an Emergency Use Authorization (EUA). This EUA will remain  in effect (meaning this test can be  used) for the duration of the COVID-19 declaration under Section 564(b)(1) of the Act, 21 U.S.C.section 360bbb-3(b)(1), unless the authorization is terminated  or revoked  sooner.       Influenza A by PCR NEGATIVE NEGATIVE Final   Influenza B by PCR NEGATIVE NEGATIVE Final    Comment: (NOTE) The Xpert Xpress SARS-CoV-2/FLU/RSV plus assay is intended as an aid in the diagnosis of influenza from Nasopharyngeal swab specimens and should not be used as a sole basis for treatment. Nasal washings and aspirates are unacceptable for Xpert Xpress SARS-CoV-2/FLU/RSV testing.  Fact Sheet for Patients: EntrepreneurPulse.com.au  Fact Sheet for Healthcare Providers: IncredibleEmployment.be  This test is not yet approved or cleared by the [place] FDA and has been authorized for detection and/or diagnosis of SARS-CoV-2 by FDA under an Emergency Use Authorization (EUA). This EUA will remain in effect (meaning this test can be used) for the duration of the COVID-19 declaration under Section 564(b)(1) of the Act, 21 U.S.C. section 360bbb-3(b)(1), unless the authorization is terminated or revoked.  Performed at [facility], [place], [place] [address]   SARS CORONAVIRUS 2 (TAT 6-24 HRS) Nasopharyngeal Nasopharyngeal Swab     Status: None   Collection Time: 05/20/20 10:30 AM   Specimen: Nasopharyngeal Swab  Result Value Ref Range Status   SARS Coronavirus 2 NEGATIVE NEGATIVE Final    Comment: (NOTE) SARS-CoV-2 target nucleic acids are NOT DETECTED.  The SARS-CoV-2 RNA is generally detectable in upper and lower respiratory specimens during the acute phase of infection. Negative results do not preclude SARS-CoV-2 infection, do not rule out co-infections with other pathogens, and should not be used as the sole basis for treatment or other patient management decisions. Negative results must be combined with clinical observations, patient history, and epidemiological information. The expected result is Negative.  Fact Sheet for Patients: SugarRoll.be  Fact Sheet for  Healthcare Providers: https://www.woods-mathews.com/  This test is not yet approved or cleared by the [place] FDA and  has been authorized for detection and/or diagnosis of SARS-CoV-2 by FDA under an Emergency Use Authorization (EUA). This EUA will remain  in effect (meaning this test can be used) for the duration of the COVID-19 declaration under Se ction 564(b)(1) of the Act, 21 U.S.C. section 360bbb-3(b)(1), unless the authorization is terminated or revoked sooner.  Performed at [facility], [place] [address] [place], [place] [address]      Management plans discussed with the patient, family and they are in agreement.  CODE STATUS:     Code Status Orders  (From admission, onward)         Start     Ordered   05/15/20 2010  Full code  Continuous        05/15/20 2021        Code Status History    Date Active Date Inactive Code Status Order ID Comments User Context   01/28/2020 2133 01/30/2020 2243 Full Code [id number]  CoxBriant Cedar, DO ED   10/23/2019 1401 10/28/2019 2246 Full Code [id number]  Max Sane, MD ED   09/25/2019 1138 09/30/2019 2324 Full Code [id number]  Collier Bullock, MD ED   09/05/2019 1035 09/05/2019 1658 Full Code [id number]  [name], MD Inpatient   09/05/2019 1034 09/05/2019 1035 Full Code [id number]  [name], MD Inpatient   11/20/2018 2158 11/21/2018 2040 DNR [id number]  Vaughan Basta, MD Inpatient   04/11/2017 0602 04/18/2017 1834 Full Code [id number]  Saundra Shelling, MD Inpatient  12/24/2016 0640 12/26/2016 2020 Full Code [id number]  Harrie Foreman, MD ED   11/17/2016 0450 11/17/2016 1954 Full Code [id number]  Saundra Shelling, MD Inpatient   09/29/2016 2002 09/30/2016 1605 Full Code [id number]  Epifanio Lesches, MD ED   Advance Care Planning Activity    Advance Directive Documentation   Flowsheet Row Most Recent Value  Type of Advance Directive Healthcare Power of Attorney, Living will, Out of facility DNR (pink MOST or  yellow form)  Pre-existing out of facility DNR order (yellow form or pink MOST form) --  "MOST" Form in Place? --      TOTAL TIME TAKING CARE OF THIS PATIENT: 35 minutes.    Loletha Grayer M.D on 05/21/2020 at 8:39 AM  Between 7am to 6pm - Pager - [phone]  After 6pm go to [email or web address] - password [email or web address]  [facility]  CC: Primary care physician; Martin Majestic, FNP

## 2020-05-21 NOTE — Care Management Important Message (Signed)
Important Message  Patient Details  Name: Kristi [name] MRN: [id number] Date of Birth: [birth year]   Medicare Important Message Given:  Yes     Dannette Barbara 05/21/2020, 2:17 PM

## 2020-05-21 NOTE — Progress Notes (Signed)
Attempted to call report to [facility], placed on hold and no one ever came to phone.

## 2020-05-21 NOTE — TOC Transition Note (Signed)
Transition of Care [facility]) - CM/SW Discharge Note   Patient Details  Name: Kristi [name] MRN: [id number] Date of Birth: [birth year]  Transition of Care [facility]) CM/SW Contact:  Beverly Sessions, RN Phone Number: 05/21/2020, 11:58 AM   Clinical Narrative:     Patient to discharge to [facility] today DC info sent in the HUB Bed Side RN to call report  EMS packet on chart Repeat covid test negative  EMS transport set for 2 pm per facility request  Both daughters were notified    Final next level of care: Skilled Nursing Facility Barriers to Discharge: No Barriers Identified   Patient Goals and CMS Choice Patient states their goals for this hospitalization and ongoing recovery are:: To return home, will go to SNF is recommended.   Choice offered to / list presented to : NA  Discharge Placement              Patient chooses bed at: [facility] [place] Patient to be transferred to facility by: EMS Name of family member notified: both daughters Patient and family notified of of transfer: 05/21/20  Discharge Plan and Services   Discharge Planning Services: CM Consult Post Acute Care Choice: [place]          DME Arranged: N/A DME Agency: NA       HH Arranged: NA HH Agency: NA        Social Determinants of Health (SDOH) Interventions     Readmission Risk Interventions Readmission Risk Prevention Plan 05/16/2020 01/30/2020 10/24/2019  Transportation Screening Complete Complete Complete  Medication Review Press photographer) Complete Complete Complete  PCP or Specialist appointment within 3-5 days of discharge Complete Complete Complete  HRI or Home Care Consult Complete Complete -  SW Recovery Care/Counseling Consult Complete Complete Complete  Palliative Care Screening Not Applicable Not Applicable Not Applicable  Skilled Nursing Facility Complete Not Applicable Not Applicable  Some recent data might be hidden

## 2020-05-21 NOTE — Progress Notes (Signed)
MD notified via secure chat of current BP 91/59 with HR of 59. Patient is due to leave with transportation arranged for 2pm.

## 2020-05-25 ENCOUNTER — Ambulatory Visit: Payer: Medicare Other | Admitting: Cardiology

## 2020-05-26 ENCOUNTER — Encounter: Payer: Self-pay | Admitting: Cardiology

## 2020-06-02 ENCOUNTER — Telehealth: Payer: Self-pay

## 2020-06-02 NOTE — Telephone Encounter (Signed)
I was doing follow up and checking for lab results for this patient as we had requested some lab work 1 week after [facility] on 05/08/20. When I had checked around 05/15/20, I saw that the patient had been admitted to the hospital for AKI, Hypokalemia, and a lower GI bleed. Patient was discharged on 05/21/20 with discharge instructions advising to follow up with cardiology. I called patients daughter Clarene Critchley per DPR on file to make an appointment. Patients daughter stated that the patient is currently admitted at [facility] in [place] for rehab.   Patients daughter will call when patient is discharged from the facility.

## 2020-06-05 ENCOUNTER — Other Ambulatory Visit: Payer: Self-pay

## 2020-06-05 ENCOUNTER — Emergency Department: Payer: Medicare Other

## 2020-06-05 ENCOUNTER — Emergency Department
Admission: EM | Admit: 2020-06-05 | Discharge: 2020-06-05 | Disposition: A | Discharge status: Home / Self Care | Payer: Medicare Other | Attending: Emergency Medicine | Admitting: Emergency Medicine

## 2020-06-05 DIAGNOSIS — R079 Chest pain, unspecified: Secondary | ICD-10-CM | POA: Insufficient documentation

## 2020-06-05 DIAGNOSIS — R109 Unspecified abdominal pain: Secondary | ICD-10-CM | POA: Diagnosis not present

## 2020-06-05 DIAGNOSIS — Z79899 Other long term (current) drug therapy: Secondary | ICD-10-CM | POA: Insufficient documentation

## 2020-06-05 DIAGNOSIS — Z8542 Personal history of malignant neoplasm of other parts of uterus: Secondary | ICD-10-CM | POA: Diagnosis not present

## 2020-06-05 DIAGNOSIS — Z950 Presence of cardiac pacemaker: Secondary | ICD-10-CM | POA: Insufficient documentation

## 2020-06-05 DIAGNOSIS — Z951 Presence of aortocoronary bypass graft: Secondary | ICD-10-CM | POA: Diagnosis not present

## 2020-06-05 DIAGNOSIS — Z85048 Personal history of other malignant neoplasm of rectum, rectosigmoid junction, and anus: Secondary | ICD-10-CM | POA: Diagnosis not present

## 2020-06-05 DIAGNOSIS — R110 Nausea: Secondary | ICD-10-CM | POA: Diagnosis not present

## 2020-06-05 DIAGNOSIS — Z7901 Long term (current) use of anticoagulants: Secondary | ICD-10-CM | POA: Diagnosis not present

## 2020-06-05 DIAGNOSIS — F0390 Unspecified dementia without behavioral disturbance: Secondary | ICD-10-CM | POA: Insufficient documentation

## 2020-06-05 DIAGNOSIS — G20 Parkinson's disease: Secondary | ICD-10-CM | POA: Diagnosis not present

## 2020-06-05 DIAGNOSIS — I5033 Acute on chronic diastolic (congestive) heart failure: Secondary | ICD-10-CM | POA: Diagnosis not present

## 2020-06-05 DIAGNOSIS — I110 Hypertensive heart disease with heart failure: Secondary | ICD-10-CM | POA: Insufficient documentation

## 2020-06-05 DIAGNOSIS — I2510 Atherosclerotic heart disease of native coronary artery without angina pectoris: Secondary | ICD-10-CM | POA: Diagnosis not present

## 2020-06-05 DIAGNOSIS — E039 Hypothyroidism, unspecified: Secondary | ICD-10-CM | POA: Insufficient documentation

## 2020-06-05 DIAGNOSIS — K219 Gastro-esophageal reflux disease without esophagitis: Secondary | ICD-10-CM | POA: Insufficient documentation

## 2020-06-05 DIAGNOSIS — I4891 Unspecified atrial fibrillation: Secondary | ICD-10-CM | POA: Diagnosis not present

## 2020-06-05 DIAGNOSIS — E119 Type 2 diabetes mellitus without complications: Secondary | ICD-10-CM | POA: Insufficient documentation

## 2020-06-05 LAB — HEPATIC FUNCTION PANEL
ALT: 99 U/L — ABNORMAL HIGH (ref 0–44)
AST: 67 U/L — ABNORMAL HIGH (ref 15–41)
Albumin: 3.4 g/dL — ABNORMAL LOW (ref 3.5–5.0)
Alkaline Phosphatase: 94 U/L (ref 38–126)
Bilirubin, Direct: <0.1 mg/dL (ref 0.0–0.2)
Total Bilirubin: 0.5 mg/dL (ref 0.3–1.2)
Total Protein: 7.5 g/dL (ref 6.5–8.1)

## 2020-06-05 LAB — LIPASE, BLOOD: Lipase: 90 U/L — ABNORMAL HIGH (ref 11–51)

## 2020-06-05 LAB — BASIC METABOLIC PANEL
Anion gap: 11 (ref 5–15)
BUN: 33 mg/dL — ABNORMAL HIGH (ref 8–23)
CO2: 30 mmol/L (ref 22–32)
Calcium: 9.4 mg/dL (ref 8.9–10.3)
Chloride: 95 mmol/L — ABNORMAL LOW (ref 98–111)
Creatinine, Ser: 0.97 mg/dL (ref 0.44–1.00)
GFR, Estimated: 59 mL/min — ABNORMAL LOW (ref >60)
Glucose, Bld: 181 mg/dL — ABNORMAL HIGH (ref 70–99)
Potassium: 3.2 mmol/L — ABNORMAL LOW (ref 3.5–5.1)
Sodium: 136 mmol/L (ref 135–145)

## 2020-06-05 LAB — TROPONIN I (HIGH SENSITIVITY)
Troponin I (High Sensitivity): 8 ng/L (ref <18)
Troponin I (High Sensitivity): 8 ng/L (ref <18)

## 2020-06-05 LAB — CBC
HCT: 37.6 % (ref 36.0–46.0)
Hemoglobin: 12.2 g/dL (ref 12.0–15.0)
MCH: 30.1 pg (ref 26.0–34.0)
MCHC: 32.4 g/dL (ref 30.0–36.0)
MCV: 92.8 fL (ref 80.0–100.0)
Platelets: 317 10*3/uL (ref 150–400)
RBC: 4.05 MIL/uL (ref 3.87–5.11)
RDW: 15.4 % (ref 11.5–15.5)
WBC: 7.7 10*3/uL (ref 4.0–10.5)
nRBC: 0 % (ref 0.0–0.2)

## 2020-06-05 MED ORDER — POTASSIUM CHLORIDE CRYS ER 20 MEQ PO TBCR
40.0000 meq | EXTENDED_RELEASE_TABLET | Freq: Once | ORAL | Status: AC
  Administered 2020-06-05: 40 meq via ORAL
  Filled 2020-06-05: qty 2

## 2020-06-05 MED ORDER — ONDANSETRON 4 MG PO TBDP
4.0000 mg | ORAL_TABLET | Freq: Once | ORAL | Status: AC
  Administered 2020-06-05: 4 mg via ORAL
  Filled 2020-06-05: qty 1

## 2020-06-05 NOTE — ED Provider Notes (Signed)
[facility] Emergency Department Provider Note   ____________________________________________   Event Date/Time   First MD Initiated Contact with Patient 06/05/20 1824     (approximate)  I have reviewed the triage vital signs and the nursing notes.   HISTORY  Chief Complaint Chest Pain    HPI Kristi [name] is a 80 y.o. female with past medical history of hypertension, hyperlipidemia, diabetes, CAD, diastolic CHF, atrial fibrillation on Eliquis, Parkinson disease, and chronic pain syndrome who presents to the ED complaining of abdominal and chest pain.  Patient reports that she has been dealing with a "upset stomach" for the past couple of days with some nausea.  She has not had any vomiting and states she has been eating or drinking without difficulty.  She has had occasional diarrhea but denies any dysuria, hematuria, or fever.  She was sitting in her bed earlier today at [facility] when she had onset of "shooting pain" in the middle of her chest.  She denies any associated shortness of breath and pain in her chest has since resolved.  Other than some mild nausea, she is now feeling back to normal.        Past Medical History:  Diagnosis Date  . Anxiety   . Arthritis   . Broken arm    left FA 3/17  . Broken arm    left  . CAD (coronary artery disease)    s/p MI  . Cancer ([place])    uterine  . Cervical disc disorder   . Depression   . Diabetes mellitus without complication ([place])   . GERD (gastroesophageal reflux disease)   . Headache   . Hyperlipidemia   . Hypertension   . Hypothyroid   . Myocardial infarction ([place])    15 year ago  . Neck pain 06/04/2014  . Parkinson's disease ([place])   . Presence of permanent cardiac pacemaker   . Spinal stenosis     Patient Active Problem List   Diagnosis Date Noted  . Diarrhea   . Myoclonus   . Weakness   . AKI (acute kidney injury) ([place]) 05/16/2020  . Transaminitis 05/16/2020  . GI bleed  01/28/2020  . Rectal bleed 01/28/2020  . At risk for polypharmacy 01/28/2020  . Acute delirium   . HCAP (healthcare-associated pneumonia)   . Hypotension   . Hyponatremia   . Lobar pneumonia ([place]) 10/23/2019  . Atrial flutter ([place])   . Dizziness   . Atrial fibrillation status post cardioversion ([place]) 09/27/2019  . CHF (congestive heart failure) ([place]) 09/26/2019  . Acute on chronic diastolic heart failure ([place]) 09/25/2019  . Hypertensive urgency 09/25/2019  . Fluid overload 09/25/2019  . Excessive gas 05/14/2019  . Generalized bloating 05/14/2019  . Bright red blood per rectum 05/14/2019  . 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 Chest pain 11/20/2018  . Difficulty walking 11/19/2018  . Insomnia 11/15/2018  . Type 2 diabetes mellitus with hypoglycemia without coma, with long-term current use of insulin ([place]) 11/15/2018  . Goals of care, counseling/discussion 02/06/2018  . 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 Rectal mass 01/05/2018  . Leg pain, bilateral 12/06/2017  . Lymphedema 07/10/2017  . Atherosclerosis of native arteries of the extremities with ulceration ([place]) 07/10/2017  . Stricture and stenosis of esophagus   . Dysphagia   . Syncope and collapse 04/11/2017  . Mild dementia ([place]) 01/24/2017  . Lumbar facet osteoarthritis (Bilateral) 01/17/2017  . Acute postoperative pain  01/17/2017  . Hypokalemia 12/24/2016  . Anxiety 12/07/2016  . 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 Cerebrovascular accident ([place]) 12/07/2016  . Chronic depression 12/07/2016  . Essential hypertension 12/07/2016  . Hypothyroidism 12/07/2016  . Mixed hyperlipidemia 12/07/2016  . Myocardial infarction ([place]) 12/07/2016  . TIA (transient ischemic attack) 11/17/2016  . Parkinson disease ([place]) 10/27/2016  . Tremor 10/27/2016  . Chronic knee pain (B) (R>L) 10/19/2016  . Ischemic chest pain ([place]) 09/29/2016  . Abnormal CT scan, lumbar spine  09/08/2016  . Lumbar foraminal stenosis (multilevel) 09/08/2016  . Fracture of clavicle 08/03/2016  . Polyneuropathy 05/17/2016  . Neurogenic pain 04/12/2016  . Chronic lower extremity pain (Bilateral) (R>L) 04/12/2016  . Chronic lower extremity radicular pain (Primary Source of Pain) (Bilateral) (R>L) 04/12/2016  . Lower extremity weakness 04/12/2016  . Chronic low back pain (Secondary source of pain) (Bilateral) (R>L) 04/12/2016  . Chronic wrist pain (Left) (secondary to fracture) 04/12/2016  . Lumbar spondylosis 04/12/2016  . Chronic pain syndrome 04/11/2016  . Long term current use of opiate analgesic 04/11/2016  . Long term prescription opiate use 04/11/2016  . Opiate use 04/11/2016  . Long term prescription benzodiazepine use 04/11/2016  . Lumbar radiculopathy (Multilevel) (Bilateral) 05/07/2015  . Lumbar facet syndrome (Bilateral) (R>L) 08/21/2014  . 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 Sacroiliac joint dysfunction (Bilateral) 08/13/2014  . Frequent falls 06/04/2014  . Memory loss 06/04/2014  . Anxiety and depression 08/22/2013  . CAD (coronary artery disease) 08/22/2013  . Therapeutic opioid-induced constipation (OIC) 08/22/2013  . GERD (gastroesophageal reflux disease) 08/22/2013    Past Surgical History:  Procedure Laterality Date  . ABDOMINAL HYSTERECTOMY     partial  . BREAST SURGERY    . CARDIOVERSION N/A 10/24/2019   Procedure: CARDIOVERSION;  Surgeon: Wellington Hampshire, MD;  Location: [facility] ORS;  Service: Cardiovascular;  Laterality: N/A;  . COLONOSCOPY    . COLONOSCOPY N/A 01/30/2020   Procedure: COLONOSCOPY;  Surgeon: Toledo, Benay Pike, MD;  Location: [facility] ENDOSCOPY;  Service: Gastroenterology;  Laterality: N/A;  . COLONOSCOPY WITH PROPOFOL N/A 11/29/2017   Procedure: COLONOSCOPY WITH PROPOFOL;  Surgeon: [name], MD;  Location: [facility] ENDOSCOPY;  Service: Endoscopy;  Laterality: N/A;  . CORONARY ANGIOPLASTY     stent  .  CORONARY ARTERY BYPASS GRAFT    . CORONARY STENT PLACEMENT    . ESOPHAGOGASTRODUODENOSCOPY (EGD) WITH PROPOFOL N/A 04/18/2017   Procedure: ESOPHAGOGASTRODUODENOSCOPY (EGD) WITH PROPOFOL;  Surgeon: [name], MD;  Location: [facility] ENDOSCOPY;  Service: Endoscopy;  Laterality: N/A;  . ESOPHAGOGASTRODUODENOSCOPY (EGD) WITH PROPOFOL N/A 11/29/2017   Procedure: ESOPHAGOGASTRODUODENOSCOPY (EGD) WITH PROPOFOL;  Surgeon: [name], MD;  Location: [facility] ENDOSCOPY;  Service: Endoscopy;  Laterality: N/A;  . FLEXIBLE SIGMOIDOSCOPY N/A 05/17/2020   Procedure: FLEXIBLE SIGMOIDOSCOPY;  Surgeon: Lin Landsman, MD;  Location: [facility] ENDOSCOPY;  Service: Gastroenterology;  Laterality: N/A;  . INSERT / REPLACE / REMOVE PACEMAKER    . PPM GENERATOR CHANGEOUT N/A 09/05/2019   Procedure: PPM GENERATOR CHANGEOUT;  Surgeon: [name], MD;  Location: [facility];  Service: Cardiovascular;  Laterality: N/A;  . s/p pacer insertion    . TRANSANAL EXCISION OF RECTAL MASS N/A 01/17/2018   Procedure: TRANSANAL EXCISION OF RECTAL POLYP;  Surgeon: [name], MD;  Location: [facility] ORS;  Service: General;  Laterality: N/A;    Prior to Admission medications   Medication Sig Start Date End Date Taking? Authorizing Provider  acetaminophen (TYLENOL) 325 MG  tablet Take 2 tablets (650 mg total) by mouth every 6 (six) hours as needed for mild pain (or Fever >/= 101). 05/21/20   Loletha Grayer, MD  albuterol (VENTOLIN HFA) 108 (90 Base) MCG/ACT inhaler Inhale 2 puffs into the lungs every 6 (six) hours as needed for wheezing or shortness of breath. Patient not taking: No sig reported 10/28/19   Loletha Grayer, MD  amiodarone (PACERONE) 100 MG tablet Take 1 tablet (100 mg total) by mouth daily. 05/21/20   Loletha Grayer, MD  cholecalciferol (VITAMIN D3) 25 MCG (1000 UNIT) tablet Take 1,000 Units by mouth daily.    [provider]  cholestyramine (QUESTRAN) 4 g packet Take 1 packet (4 g total) by mouth  daily. in water 10/28/19   Loletha Grayer, MD  donepezil (ARICEPT) 10 MG tablet Take 10 mg by mouth daily.     [provider]  Ferrous Sulfate (SLOW FE) 142 (45 Fe) MG TBCR Take 1 tablet by mouth twice daily 11/19/19   [name], MD  gabapentin (NEURONTIN) 100 MG capsule Take 2 capsules (200 mg total) by mouth 3 (three) times daily. 05/21/20   Loletha Grayer, MD  HYDROcodone-acetaminophen (NORCO/VICODIN) 5-325 MG tablet Take 1 tablet by mouth every 8 (eight) hours as needed for moderate pain or severe pain. 05/21/20   Loletha Grayer, MD  hydrocortisone (ANUSOL-HC) 25 MG suppository Place 1 suppository (25 mg total) rectally 2 (two) times daily as needed for hemorrhoids or anal itching. Okay to substitute generic 05/21/20   Loletha Grayer, MD  hydrOXYzine (ATARAX/VISTARIL) 25 MG tablet Take 25 mg by mouth 2 (two) times daily. 01/27/20   [provider]  levothyroxine (SYNTHROID) 125 MCG tablet Take 125 mcg by mouth daily.  12/25/19   [provider]  linagliptin (TRADJENTA) 5 MG TABS tablet Take 1 tablet (5 mg total) by mouth daily. 05/21/20   Loletha Grayer, MD  loratadine (CLARITIN) 10 MG tablet Take 10 mg by mouth daily.     [provider]  meclizine (ANTIVERT) 12.5 MG tablet Take 1 tablet (12.5 mg total) by mouth 2 (two) times daily as needed for dizziness. 09/30/19   Jennye Boroughs, MD  memantine (NAMENDA) 5 MG tablet Take 5 mg by mouth at bedtime. 05/04/20   [provider]  metoprolol succinate (TOPROL-XL) 25 MG 24 hr tablet Take 0.5 tablets (12.5 mg total) by mouth at bedtime. 10/28/19   Loletha Grayer, MD  montelukast (SINGULAIR) 10 MG tablet Take 10 mg by mouth at bedtime.  07/23/19   [provider]  Multiple Vitamin (MULTIVITAMIN WITH MINERALS) TABS tablet Take 1 tablet by mouth daily.    [provider]  nitroGLYCERIN (NITROSTAT) 0.4 MG SL tablet Place 0.4 mg under the tongue every 5 (five) minutes as needed for chest pain.      [provider]  omeprazole (PRILOSEC) 40 MG capsule Take 40 mg by mouth 2 (two) times daily. 10/22/18   [provider]  potassium chloride SA (KLOR-CON) 20 MEQ tablet Take 1 tablet (20 mEq total) by mouth 2 (two) times daily. 05/21/20   Loletha Grayer, MD  sertraline (ZOLOFT) 100 MG tablet Take 100 mg by mouth 2 (two) times daily. 11/19/18 04/24/30  [provider]  torsemide (DEMADEX) 20 MG tablet Take 2 tablets (40 mg total) by mouth 2 (two) times daily. 04/24/20   Kate Sable, MD  traZODone (DESYREL) 100 MG tablet Take 1 tablet (100 mg total) by mouth at bedtime. 10/28/19   Loletha Grayer, MD  vitamin B-12 (CYANOCOBALAMIN) 1000 MCG tablet Take 1,000 mcg by mouth daily.    [provider]  vitamin E 180 MG (400 UNITS) capsule Take 400 Units by mouth daily.    [provider]    Allergies Penicillins  Family History  Problem Relation Age of Onset  . Cancer Mother   . Depression Mother   . Diabetes Mother   . Hypertension Mother   . Aneurysm Mother   . Heart disease Father   . Diabetes Sister   . Diabetes Brother   . Diabetes Brother     Social History Social History   Tobacco Use  . Smoking status: Never Smoker  . Smokeless tobacco: Never Used  Vaping Use  . Vaping Use: Never used  Substance Use Topics  . Alcohol use: No  . Drug use: No    Review of Systems  Constitutional: No fever/chills Eyes: No visual changes. ENT: No sore throat. Cardiovascular: Positive for chest pain. Respiratory: Denies shortness of breath. Gastrointestinal: No abdominal pain.  Positive for nausea, no vomiting.  No diarrhea.  No constipation. Genitourinary: Negative for dysuria. Musculoskeletal: Negative for back pain. Skin: Negative for rash. Neurological: Negative for headaches, focal weakness or numbness.  ____________________________________________   PHYSICAL EXAM:  VITAL SIGNS: ED Triage Vitals  Enc Vitals Group     BP 06/05/20  1359 106/74     Pulse Rate 06/05/20 1359 65     Resp 06/05/20 1359 18     Temp 06/05/20 1359 98.3 F (36.8 C)     Temp Source 06/05/20 1359 Oral     SpO2 06/05/20 1359 97 %     Weight 06/05/20 1358 170 lb (77.1 kg)     Height 06/05/20 1358 5\' 6"  (1.676 m)     Head Circumference --      [facility] --      Pain Score 06/05/20 1358 0     Pain Loc --      Pain Edu? --      Excl. in [place]? --     Constitutional: Alert and oriented. Eyes: Conjunctivae are normal. Head: Atraumatic. Nose: No congestion/rhinnorhea. Mouth/Throat: Mucous membranes are moist. Neck: Normal ROM Cardiovascular: Normal rate, regular rhythm. Grossly normal heart sounds.  2+ radial pulses bilaterally. Respiratory: Normal respiratory effort.  No retractions. Lungs CTAB.  No chest wall tenderness to palpation. Gastrointestinal: Soft and nontender. No distention. Genitourinary: deferred Musculoskeletal: No lower extremity tenderness nor edema. Neurologic:  Normal speech and language. No gross focal neurologic deficits are appreciated. Skin:  Skin is warm, dry and intact. No rash noted. Psychiatric: Mood and affect are normal. Speech and behavior are normal.  ____________________________________________   LABS (all labs ordered are listed, but only abnormal results are displayed)  Labs Reviewed  BASIC METABOLIC PANEL - Abnormal; Notable for the following components:      Result Value   Potassium 3.2 (*)    Chloride 95 (*)    Glucose, Bld 181 (*)    BUN 33 (*)    GFR, Estimated 59 (*)    All other components within normal limits  LIPASE, BLOOD - Abnormal; Notable for the following components:   Lipase 90 (*)    All other components within normal limits  HEPATIC FUNCTION PANEL - Abnormal; Notable for the following components:   Albumin 3.4 (*)    AST 67 (*)    ALT 99 (*)    All other components within normal limits  CBC  TROPONIN I (HIGH  SENSITIVITY)  TROPONIN I (HIGH SENSITIVITY)    ____________________________________________  EKG  ED ECG REPORT I, Blake Divine, the attending physician, personally viewed and interpreted this ECG.   Date: 06/05/2020  EKG Time: 14:02  Rate: 89  Rhythm: Atrial paced rhythm  Axis: Normal  Intervals:Prolonged QT  ST&T Change: Anterior T wave changes   PROCEDURES  Procedure(s) performed (including Critical Care):  Procedures   ____________________________________________   INITIAL IMPRESSION / ASSESSMENT AND PLAN / ED COURSE       80 year old female with past medical history of hypertension, hyperlipidemia, diabetes, CAD, diastolic CHF, atrial fibrillation on Eliquis, Parkinson disease, and chronic pain syndrome who presents to the ED with a couple days of nausea and diarrhea with onset of chest pain earlier today.  She is now chest pain-free and has no abdominal tenderness whatsoever on exam.  EKG shows anterior T wave inversions but with atypical symptoms and to negative troponin, I have a low suspicion for ACS.  Chest x-ray reviewed by me and shows no infiltrate, edema, or effusion.  We will add on LFTs and lipase, give dose of Zofran.  If remainder of work-up is unremarkable and patient is able to tolerate p.o., she would be appropriate for discharge back to rehab facility.  Patient reports feeling better following dose of Zofran, remains chest pain-free here in the ED.  She was able to tolerate water without difficulty.  LFTs and lipase are mildly elevated but improving from her recent admission.  She is appropriate for discharge home with PCP follow-up, was counseled to return to the ED for new worsening symptoms.  Patient agrees with plan.      ____________________________________________   FINAL CLINICAL IMPRESSION(S) / ED DIAGNOSES  Final diagnoses:  Nonspecific chest pain  Nausea     ED Discharge Orders    None       Note:  This document was prepared using Dragon voice recognition software and may  include unintentional dictation errors.   Blake Divine, MD 06/05/20 2016

## 2020-06-05 NOTE — ED Triage Notes (Addendum)
Pt comes from [facility] with c/o CP. Pt states this just started today. Pt is on blood thinner.  Pt denies any SOB. Pt has cardiac hx and stent placement. Pt has pacemaker.  Pt states radiation earlier but has now stopped.  BP-VSS O2-98% T-98.6 CBG-220

## 2020-06-05 NOTE — ED Notes (Signed)
[facility] called @ 20:33 for transport to [facility].

## 2020-06-12 ENCOUNTER — Ambulatory Visit (INDEPENDENT_AMBULATORY_CARE_PROVIDER_SITE_OTHER): Payer: Medicare Other

## 2020-06-12 DIAGNOSIS — I440 Atrioventricular block, first degree: Secondary | ICD-10-CM | POA: Diagnosis not present

## 2020-06-12 LAB — CUP PACEART REMOTE DEVICE CHECK
Battery Remaining Longevity: 155 mo
Battery Voltage: 3.13 V
Brady Statistic AP VP Percent: 0.08 %
Brady Statistic AP VS Percent: 98.68 %
Brady Statistic AS VP Percent: 0 %
Brady Statistic AS VS Percent: 1.25 %
Brady Statistic RA Percent Paced: 98.66 %
Brady Statistic RV Percent Paced: 0.09 %
Date Time Interrogation Session: 20220324181125
Implantable Lead Implant Date: 20080806
Implantable Lead Implant Date: 20080806
Implantable Lead Location: 753859
Implantable Lead Location: 753860
Implantable Pulse Generator Implant Date: 20210617
Lead Channel Impedance Value: 494 Ohm
Lead Channel Impedance Value: 532 Ohm
Lead Channel Impedance Value: 608 Ohm
Lead Channel Impedance Value: 646 Ohm
Lead Channel Pacing Threshold Amplitude: 0.875 V
Lead Channel Pacing Threshold Amplitude: 1 V
Lead Channel Pacing Threshold Pulse Width: 0.4 ms
Lead Channel Pacing Threshold Pulse Width: 0.4 ms
Lead Channel Sensing Intrinsic Amplitude: 20.5 mV
Lead Channel Sensing Intrinsic Amplitude: 20.5 mV
Lead Channel Sensing Intrinsic Amplitude: 3.75 mV
Lead Channel Sensing Intrinsic Amplitude: 3.75 mV
Lead Channel Setting Pacing Amplitude: 1.75 V
Lead Channel Setting Pacing Amplitude: 2.5 V
Lead Channel Setting Pacing Pulse Width: 0.4 ms
Lead Channel Setting Sensing Sensitivity: 4 mV

## 2020-06-14 ENCOUNTER — Inpatient Hospital Stay
Admission: EM | Admit: 2020-06-14 | Discharge: 2020-06-19 | DRG: 690 | Disposition: A | Discharge status: Skilled Nursing Facility | Payer: Medicare Other | Attending: Family Medicine | Admitting: Family Medicine

## 2020-06-14 ENCOUNTER — Encounter: Payer: Self-pay | Admitting: Emergency Medicine

## 2020-06-14 ENCOUNTER — Other Ambulatory Visit: Payer: Self-pay

## 2020-06-14 ENCOUNTER — Emergency Department: Payer: Medicare Other

## 2020-06-14 ENCOUNTER — Observation Stay: Payer: Medicare Other

## 2020-06-14 DIAGNOSIS — E1142 Type 2 diabetes mellitus with diabetic polyneuropathy: Secondary | ICD-10-CM | POA: Diagnosis present

## 2020-06-14 DIAGNOSIS — I482 Chronic atrial fibrillation, unspecified: Secondary | ICD-10-CM | POA: Diagnosis not present

## 2020-06-14 DIAGNOSIS — Z90710 Acquired absence of both cervix and uterus: Secondary | ICD-10-CM

## 2020-06-14 DIAGNOSIS — N390 Urinary tract infection, site not specified: Secondary | ICD-10-CM | POA: Diagnosis not present

## 2020-06-14 DIAGNOSIS — E860 Dehydration: Secondary | ICD-10-CM | POA: Diagnosis present

## 2020-06-14 DIAGNOSIS — Z951 Presence of aortocoronary bypass graft: Secondary | ICD-10-CM

## 2020-06-14 DIAGNOSIS — E876 Hypokalemia: Secondary | ICD-10-CM | POA: Diagnosis present

## 2020-06-14 DIAGNOSIS — N179 Acute kidney failure, unspecified: Secondary | ICD-10-CM | POA: Diagnosis present

## 2020-06-14 DIAGNOSIS — Z79899 Other long term (current) drug therapy: Secondary | ICD-10-CM

## 2020-06-14 DIAGNOSIS — E1129 Type 2 diabetes mellitus with other diabetic kidney complication: Secondary | ICD-10-CM | POA: Diagnosis present

## 2020-06-14 DIAGNOSIS — G894 Chronic pain syndrome: Secondary | ICD-10-CM | POA: Diagnosis present

## 2020-06-14 DIAGNOSIS — N1832 Chronic kidney disease, stage 3b: Secondary | ICD-10-CM | POA: Diagnosis present

## 2020-06-14 DIAGNOSIS — K5900 Constipation, unspecified: Secondary | ICD-10-CM | POA: Diagnosis present

## 2020-06-14 DIAGNOSIS — N3000 Acute cystitis without hematuria: Secondary | ICD-10-CM | POA: Diagnosis not present

## 2020-06-14 DIAGNOSIS — Z950 Presence of cardiac pacemaker: Secondary | ICD-10-CM

## 2020-06-14 DIAGNOSIS — Z7984 Long term (current) use of oral hypoglycemic drugs: Secondary | ICD-10-CM

## 2020-06-14 DIAGNOSIS — F419 Anxiety disorder, unspecified: Secondary | ICD-10-CM | POA: Diagnosis present

## 2020-06-14 DIAGNOSIS — Z1611 Resistance to penicillins: Secondary | ICD-10-CM | POA: Diagnosis present

## 2020-06-14 DIAGNOSIS — Z8673 Personal history of transient ischemic attack (TIA), and cerebral infarction without residual deficits: Secondary | ICD-10-CM

## 2020-06-14 DIAGNOSIS — R10819 Abdominal tenderness, unspecified site: Secondary | ICD-10-CM

## 2020-06-14 DIAGNOSIS — E611 Iron deficiency: Secondary | ICD-10-CM | POA: Diagnosis present

## 2020-06-14 DIAGNOSIS — G20 Parkinson's disease: Secondary | ICD-10-CM | POA: Diagnosis present

## 2020-06-14 DIAGNOSIS — I5032 Chronic diastolic (congestive) heart failure: Secondary | ICD-10-CM | POA: Diagnosis present

## 2020-06-14 DIAGNOSIS — E039 Hypothyroidism, unspecified: Secondary | ICD-10-CM | POA: Diagnosis present

## 2020-06-14 DIAGNOSIS — Z20822 Contact with and (suspected) exposure to covid-19: Secondary | ICD-10-CM | POA: Diagnosis present

## 2020-06-14 DIAGNOSIS — I10 Essential (primary) hypertension: Secondary | ICD-10-CM | POA: Diagnosis present

## 2020-06-14 DIAGNOSIS — E872 Acidosis, unspecified: Secondary | ICD-10-CM | POA: Diagnosis present

## 2020-06-14 DIAGNOSIS — Z79890 Hormone replacement therapy (postmenopausal): Secondary | ICD-10-CM

## 2020-06-14 DIAGNOSIS — R197 Diarrhea, unspecified: Secondary | ICD-10-CM

## 2020-06-14 DIAGNOSIS — F0280 Dementia in other diseases classified elsewhere without behavioral disturbance: Secondary | ICD-10-CM | POA: Diagnosis present

## 2020-06-14 DIAGNOSIS — R309 Painful micturition, unspecified: Secondary | ICD-10-CM | POA: Diagnosis present

## 2020-06-14 DIAGNOSIS — I130 Hypertensive heart and chronic kidney disease with heart failure and stage 1 through stage 4 chronic kidney disease, or unspecified chronic kidney disease: Secondary | ICD-10-CM | POA: Diagnosis present

## 2020-06-14 DIAGNOSIS — E1122 Type 2 diabetes mellitus with diabetic chronic kidney disease: Secondary | ICD-10-CM | POA: Diagnosis present

## 2020-06-14 DIAGNOSIS — B9620 Unspecified Escherichia coli [E. coli] as the cause of diseases classified elsewhere: Secondary | ICD-10-CM | POA: Diagnosis present

## 2020-06-14 DIAGNOSIS — Z6831 Body mass index (BMI) 31.0-31.9, adult: Secondary | ICD-10-CM

## 2020-06-14 DIAGNOSIS — I2510 Atherosclerotic heart disease of native coronary artery without angina pectoris: Secondary | ICD-10-CM | POA: Diagnosis present

## 2020-06-14 DIAGNOSIS — K649 Unspecified hemorrhoids: Secondary | ICD-10-CM | POA: Diagnosis present

## 2020-06-14 DIAGNOSIS — R531 Weakness: Secondary | ICD-10-CM

## 2020-06-14 DIAGNOSIS — E669 Obesity, unspecified: Secondary | ICD-10-CM | POA: Diagnosis present

## 2020-06-14 DIAGNOSIS — Z955 Presence of coronary angioplasty implant and graft: Secondary | ICD-10-CM

## 2020-06-14 DIAGNOSIS — F32A Depression, unspecified: Secondary | ICD-10-CM | POA: Diagnosis present

## 2020-06-14 DIAGNOSIS — K219 Gastro-esophageal reflux disease without esophagitis: Secondary | ICD-10-CM | POA: Diagnosis present

## 2020-06-14 DIAGNOSIS — Z8542 Personal history of malignant neoplasm of other parts of uterus: Secondary | ICD-10-CM

## 2020-06-14 DIAGNOSIS — I252 Old myocardial infarction: Secondary | ICD-10-CM

## 2020-06-14 DIAGNOSIS — E785 Hyperlipidemia, unspecified: Secondary | ICD-10-CM | POA: Diagnosis present

## 2020-06-14 LAB — URINALYSIS, COMPLETE (UACMP) WITH MICROSCOPIC
Bilirubin Urine: NEGATIVE
Glucose, UA: NEGATIVE mg/dL
Ketones, ur: NEGATIVE mg/dL
Nitrite: NEGATIVE
Protein, ur: NEGATIVE mg/dL
Specific Gravity, Urine: 1.005 (ref 1.005–1.030)
WBC, UA: >50 WBC/hpf — ABNORMAL HIGH (ref 0–5)
pH: 5 (ref 5.0–8.0)

## 2020-06-14 LAB — COMPREHENSIVE METABOLIC PANEL
ALT: 33 U/L (ref 0–44)
AST: 32 U/L (ref 15–41)
Albumin: 3.4 g/dL — ABNORMAL LOW (ref 3.5–5.0)
Alkaline Phosphatase: 79 U/L (ref 38–126)
Anion gap: 16 — ABNORMAL HIGH (ref 5–15)
BUN: 43 mg/dL — ABNORMAL HIGH (ref 8–23)
CO2: 33 mmol/L — ABNORMAL HIGH (ref 22–32)
Calcium: 9.1 mg/dL (ref 8.9–10.3)
Chloride: 85 mmol/L — ABNORMAL LOW (ref 98–111)
Creatinine, Ser: 2.07 mg/dL — ABNORMAL HIGH (ref 0.44–1.00)
GFR, Estimated: 24 mL/min — ABNORMAL LOW (ref >60)
Glucose, Bld: 149 mg/dL — ABNORMAL HIGH (ref 70–99)
Potassium: 2.2 mmol/L — CL (ref 3.5–5.1)
Sodium: 134 mmol/L — ABNORMAL LOW (ref 135–145)
Total Bilirubin: 0.5 mg/dL (ref 0.3–1.2)
Total Protein: 6.9 g/dL (ref 6.5–8.1)

## 2020-06-14 LAB — CBC WITH DIFFERENTIAL/PLATELET
Abs Immature Granulocytes: 0.02 10*3/uL (ref 0.00–0.07)
Basophils Absolute: 0 10*3/uL (ref 0.0–0.1)
Basophils Relative: 1 %
Eosinophils Absolute: 0.1 10*3/uL (ref 0.0–0.5)
Eosinophils Relative: 1 %
HCT: 34.7 % — ABNORMAL LOW (ref 36.0–46.0)
Hemoglobin: 11.5 g/dL — ABNORMAL LOW (ref 12.0–15.0)
Immature Granulocytes: 0 %
Lymphocytes Relative: 21 %
Lymphs Abs: 1.4 10*3/uL (ref 0.7–4.0)
MCH: 30.6 pg (ref 26.0–34.0)
MCHC: 33.1 g/dL (ref 30.0–36.0)
MCV: 92.3 fL (ref 80.0–100.0)
Monocytes Absolute: 0.5 10*3/uL (ref 0.1–1.0)
Monocytes Relative: 7 %
Neutro Abs: 4.6 10*3/uL (ref 1.7–7.7)
Neutrophils Relative %: 70 %
Platelets: 166 10*3/uL (ref 150–400)
RBC: 3.76 MIL/uL — ABNORMAL LOW (ref 3.87–5.11)
RDW: 15.9 % — ABNORMAL HIGH (ref 11.5–15.5)
WBC: 6.6 10*3/uL (ref 4.0–10.5)
nRBC: 0 % (ref 0.0–0.2)

## 2020-06-14 LAB — LACTIC ACID, PLASMA
Lactic Acid, Venous: 2.5 mmol/L (ref 0.5–1.9)
Lactic Acid, Venous: 3.2 mmol/L (ref 0.5–1.9)
Lactic Acid, Venous: 0.3 mmol/L — ABNORMAL LOW (ref 0.5–1.9)

## 2020-06-14 LAB — PROTIME-INR
INR: 1.2 (ref 0.8–1.2)
Prothrombin Time: 14.4 seconds (ref 11.4–15.2)

## 2020-06-14 LAB — GLUCOSE, CAPILLARY
Glucose-Capillary: 198 mg/dL — ABNORMAL HIGH (ref 70–99)
Glucose-Capillary: 153 mg/dL — ABNORMAL HIGH (ref 70–99)

## 2020-06-14 LAB — MAGNESIUM: Magnesium: 2 mg/dL (ref 1.7–2.4)

## 2020-06-14 LAB — APTT: aPTT: 30 seconds (ref 24–36)

## 2020-06-14 LAB — BRAIN NATRIURETIC PEPTIDE: B Natriuretic Peptide: 30.2 pg/mL (ref 0.0–100.0)

## 2020-06-14 LAB — CBG MONITORING, ED: Glucose-Capillary: 150 mg/dL — ABNORMAL HIGH (ref 70–99)

## 2020-06-14 LAB — LIPASE, BLOOD: Lipase: 47 U/L (ref 11–51)

## 2020-06-14 MED ORDER — LACTATED RINGERS IV BOLUS
1000.0000 mL | Freq: Once | INTRAVENOUS | Status: AC
  Administered 2020-06-14: 1000 mL via INTRAVENOUS

## 2020-06-14 MED ORDER — LINACLOTIDE 72 MCG PO CAPS
72.0000 ug | ORAL_CAPSULE | Freq: Every day | ORAL | Status: DC
  Administered 2020-06-15 – 2020-06-19 (×5): 72 ug via ORAL
  Filled 2020-06-14 (×6): qty 1

## 2020-06-14 MED ORDER — LACTATED RINGERS IV BOLUS
500.0000 mL | Freq: Once | INTRAVENOUS | Status: AC
  Administered 2020-06-14: 500 mL via INTRAVENOUS

## 2020-06-14 MED ORDER — ONDANSETRON HCL 4 MG/2ML IJ SOLN: 4.0000 mg | Freq: Three times a day (TID) | INTRAMUSCULAR | Status: DC | PRN

## 2020-06-14 MED ORDER — FERROUS SULFATE 325 (65 FE) MG PO TABS
325.0000 mg | ORAL_TABLET | Freq: Every day | ORAL | Status: DC
  Administered 2020-06-15 – 2020-06-19 (×5): 325 mg via ORAL
  Filled 2020-06-14 (×5): qty 1

## 2020-06-14 MED ORDER — HYDROCORTISONE ACETATE 25 MG RE SUPP
25.0000 mg | Freq: Two times a day (BID) | RECTAL | Status: DC | PRN
  Filled 2020-06-14: qty 1

## 2020-06-14 MED ORDER — TRAZODONE HCL 100 MG PO TABS
100.0000 mg | ORAL_TABLET | Freq: Every day | ORAL | Status: DC
  Administered 2020-06-15 – 2020-06-18 (×4): 100 mg via ORAL
  Filled 2020-06-14 (×5): qty 1

## 2020-06-14 MED ORDER — GABAPENTIN 600 MG PO TABS
600.0000 mg | ORAL_TABLET | Freq: Every day | ORAL | Status: DC
  Administered 2020-06-15 – 2020-06-19 (×5): 600 mg via ORAL
  Filled 2020-06-14 (×5): qty 1

## 2020-06-14 MED ORDER — CHOLESTYRAMINE 4 G PO PACK: 4.0000 g | PACK | Freq: Every day | ORAL | Status: DC

## 2020-06-14 MED ORDER — MAGNESIUM SULFATE IN D5W 1-5 GM/100ML-% IV SOLN
1.0000 g | Freq: Once | INTRAVENOUS | Status: AC
  Administered 2020-06-14: 1 g via INTRAVENOUS
  Filled 2020-06-14: qty 100

## 2020-06-14 MED ORDER — SERTRALINE HCL 50 MG PO TABS
100.0000 mg | ORAL_TABLET | Freq: Two times a day (BID) | ORAL | Status: DC
  Administered 2020-06-14 – 2020-06-19 (×10): 100 mg via ORAL
  Filled 2020-06-14 (×10): qty 2

## 2020-06-14 MED ORDER — ADULT MULTIVITAMIN W/MINERALS CH
1.0000 | ORAL_TABLET | Freq: Every day | ORAL | Status: DC
  Administered 2020-06-15 – 2020-06-19 (×5): 1 via ORAL
  Filled 2020-06-14 (×5): qty 1

## 2020-06-14 MED ORDER — AMIODARONE HCL 200 MG PO TABS
100.0000 mg | ORAL_TABLET | Freq: Every day | ORAL | Status: DC
  Administered 2020-06-14 – 2020-06-19 (×6): 100 mg via ORAL
  Filled 2020-06-14 (×6): qty 1

## 2020-06-14 MED ORDER — POTASSIUM CHLORIDE 10 MEQ/100ML IV SOLN
10.0000 meq | Freq: Once | INTRAVENOUS | Status: AC
  Administered 2020-06-14: 10 meq via INTRAVENOUS
  Filled 2020-06-14: qty 100

## 2020-06-14 MED ORDER — LEVOTHYROXINE SODIUM 50 MCG PO TABS
125.0000 ug | ORAL_TABLET | Freq: Every day | ORAL | Status: DC
  Administered 2020-06-15 – 2020-06-19 (×5): 125 ug via ORAL
  Filled 2020-06-14 (×5): qty 1

## 2020-06-14 MED ORDER — POLYETHYLENE GLYCOL 3350 17 G PO PACK
17.0000 g | PACK | Freq: Every day | ORAL | Status: DC | PRN
  Administered 2020-06-15: 17 g via ORAL
  Filled 2020-06-14: qty 1

## 2020-06-14 MED ORDER — LORATADINE 10 MG PO TABS
10.0000 mg | ORAL_TABLET | Freq: Every day | ORAL | Status: DC
  Administered 2020-06-15 – 2020-06-19 (×5): 10 mg via ORAL
  Filled 2020-06-14 (×5): qty 1

## 2020-06-14 MED ORDER — SODIUM CHLORIDE 0.9 % IV SOLN: INTRAVENOUS | Status: DC | PRN

## 2020-06-14 MED ORDER — POTASSIUM CHLORIDE 20 MEQ PO PACK: 20.0000 meq | PACK | ORAL | Status: DC

## 2020-06-14 MED ORDER — HYDROXYZINE HCL 25 MG PO TABS
25.0000 mg | ORAL_TABLET | Freq: Two times a day (BID) | ORAL | Status: DC
  Administered 2020-06-15 – 2020-06-19 (×9): 25 mg via ORAL
  Filled 2020-06-14 (×10): qty 1

## 2020-06-14 MED ORDER — MONTELUKAST SODIUM 10 MG PO TABS
10.0000 mg | ORAL_TABLET | Freq: Every day | ORAL | Status: DC
  Administered 2020-06-14 – 2020-06-18 (×5): 10 mg via ORAL
  Filled 2020-06-14 (×5): qty 1

## 2020-06-14 MED ORDER — POTASSIUM CHLORIDE 10 MEQ/100ML IV SOLN
10.0000 meq | INTRAVENOUS | Status: AC
  Administered 2020-06-14: 10 meq via INTRAVENOUS

## 2020-06-14 MED ORDER — VITAMIN E 180 MG (400 UNIT) PO CAPS
400.0000 [IU] | ORAL_CAPSULE | Freq: Every day | ORAL | Status: DC
  Administered 2020-06-15 – 2020-06-19 (×5): 400 [IU] via ORAL
  Filled 2020-06-14 (×7): qty 4

## 2020-06-14 MED ORDER — INSULIN ASPART 100 UNIT/ML ~~LOC~~ SOLN
0.0000 [IU] | Freq: Every day | SUBCUTANEOUS | Status: DC
  Administered 2020-06-15: 2 [IU] via SUBCUTANEOUS

## 2020-06-14 MED ORDER — PANTOPRAZOLE SODIUM 40 MG PO TBEC
40.0000 mg | DELAYED_RELEASE_TABLET | Freq: Every day | ORAL | Status: DC
  Administered 2020-06-15 – 2020-06-19 (×5): 40 mg via ORAL
  Filled 2020-06-14 (×5): qty 1

## 2020-06-14 MED ORDER — SODIUM CHLORIDE 0.9 % IV BOLUS
500.0000 mL | Freq: Once | INTRAVENOUS | Status: AC
  Administered 2020-06-14: 500 mL via INTRAVENOUS

## 2020-06-14 MED ORDER — DONEPEZIL HCL 5 MG PO TABS: 10.0000 mg | ORAL_TABLET | Freq: Every day | ORAL | Status: DC

## 2020-06-14 MED ORDER — VITAMIN D 25 MCG (1000 UNIT) PO TABS
1000.0000 [IU] | ORAL_TABLET | Freq: Every day | ORAL | Status: DC
  Administered 2020-06-15 – 2020-06-19 (×5): 1000 [IU] via ORAL
  Filled 2020-06-14 (×5): qty 1

## 2020-06-14 MED ORDER — ACETAMINOPHEN 325 MG PO TABS
650.0000 mg | ORAL_TABLET | Freq: Four times a day (QID) | ORAL | Status: DC | PRN
  Administered 2020-06-15 – 2020-06-19 (×3): 650 mg via ORAL
  Filled 2020-06-14 (×3): qty 2

## 2020-06-14 MED ORDER — MEMANTINE HCL 5 MG PO TABS
5.0000 mg | ORAL_TABLET | Freq: Every day | ORAL | Status: DC
  Administered 2020-06-14 – 2020-06-18 (×5): 5 mg via ORAL
  Filled 2020-06-14 (×5): qty 1

## 2020-06-14 MED ORDER — ALBUTEROL SULFATE HFA 108 (90 BASE) MCG/ACT IN AERS
2.0000 | INHALATION_SPRAY | RESPIRATORY_TRACT | Status: DC | PRN
  Filled 2020-06-14: qty 6.7

## 2020-06-14 MED ORDER — POTASSIUM CHLORIDE CRYS ER 20 MEQ PO TBCR
40.0000 meq | EXTENDED_RELEASE_TABLET | ORAL | Status: AC
  Administered 2020-06-14 (×3): 40 meq via ORAL
  Filled 2020-06-14 (×3): qty 2

## 2020-06-14 MED ORDER — INSULIN ASPART 100 UNIT/ML ~~LOC~~ SOLN
0.0000 [IU] | Freq: Three times a day (TID) | SUBCUTANEOUS | Status: DC
  Administered 2020-06-14: 2 [IU] via SUBCUTANEOUS
  Administered 2020-06-15: 3 [IU] via SUBCUTANEOUS
  Administered 2020-06-15 – 2020-06-17 (×5): 2 [IU] via SUBCUTANEOUS
  Administered 2020-06-18 – 2020-06-19 (×5): 1 [IU] via SUBCUTANEOUS
  Filled 2020-06-14 (×12): qty 1

## 2020-06-14 MED ORDER — SODIUM CHLORIDE 0.9 % IV SOLN
2.0000 g | INTRAVENOUS | Status: AC
  Administered 2020-06-15 – 2020-06-18 (×4): 2 g via INTRAVENOUS
  Filled 2020-06-14 (×3): qty 20
  Filled 2020-06-14 (×3): qty 2

## 2020-06-14 MED ORDER — HYDROCODONE-ACETAMINOPHEN 5-325 MG PO TABS: 1.0000 | ORAL_TABLET | Freq: Four times a day (QID) | ORAL | Status: DC | PRN

## 2020-06-14 MED ORDER — VITAMIN B-12 1000 MCG PO TABS
1000.0000 ug | ORAL_TABLET | Freq: Every day | ORAL | Status: DC
  Administered 2020-06-15 – 2020-06-19 (×5): 1000 ug via ORAL
  Filled 2020-06-14 (×5): qty 1

## 2020-06-14 MED ORDER — LACTATED RINGERS IV SOLN: INTRAVENOUS | Status: DC

## 2020-06-14 MED ORDER — NITROGLYCERIN 0.4 MG SL SUBL: 0.4000 mg | SUBLINGUAL_TABLET | SUBLINGUAL | Status: DC | PRN

## 2020-06-14 MED ORDER — POTASSIUM CHLORIDE 10 MEQ/100ML IV SOLN
10.0000 meq | Freq: Once | INTRAVENOUS | Status: DC
  Filled 2020-06-14: qty 100

## 2020-06-14 MED ORDER — MECLIZINE HCL 12.5 MG PO TABS
12.5000 mg | ORAL_TABLET | Freq: Two times a day (BID) | ORAL | Status: DC | PRN
  Filled 2020-06-14: qty 1

## 2020-06-14 NOTE — ED Notes (Signed)
This tech performed peri care. Dry brief placed. Pt has knowledge of purewick placement.

## 2020-06-14 NOTE — ED Triage Notes (Signed)
Pt from home via [facility]. Per family patient with tremors. Per ems family states patient has potassium issues. Family also states patient has foul smelling urine. Pt reports being sick to stomach for past 3 days. [facility] gave Zofran en route. Pt recently discharged from a [facility] for ambulation difficulties.

## 2020-06-14 NOTE — Progress Notes (Signed)
Dr. Blaine Hamper notified regarding low blood pressure. Patient given 1L LR bolus per order. Patient alert, denies pain and SOB. Will continue to monitor VS.   Fuller Mandril, RN     06/14/20 1517  Vitals  Temp (!) 97.5 F (36.4 C)  Temp Source Oral  BP (!) 91/53  MAP (mmHg) 66  BP Location Right Arm  BP Method Automatic  Patient Position (if appropriate) Lying  Pulse Rate 64  Resp 15  MEWS COLOR  MEWS Score Color Green  Oxygen Therapy  SpO2 93 %  Pain Assessment  Pain Scale 0-10  Pain Score 0  MEWS Score  MEWS Temp 0  MEWS Systolic 1  MEWS Pulse 0  MEWS RR 0  MEWS LOC 0  MEWS Score 1

## 2020-06-14 NOTE — ED Provider Notes (Signed)
[facility] Emergency Department Provider Note   ____________________________________________   Event Date/Time   First MD Initiated Contact with Patient 06/14/20 [phone]     (approximate)  I have reviewed the triage vital signs and the nursing notes.   HISTORY  Chief Complaint Tremors    HPI Kristi [name] is a 80 y.o. female history of coronary disease diabetes previous MI Parkinson's disease, recent rehabilitation stay and admission to the hospital  Patient presents today, reports she left decreased first about 3 or 4 days ago.  She been doing relatively well at home but then started noticed that her urine has become very foul.  Today she was unable to stand with her walker due to weakness in both legs.  She also feels a little tremulous and shaky and reports she felt similar to that when she came into the hospital last  She also has some very mild nausea reports that she has been suffering with this kind of upset stomach feeling for several days, had similar going on about a week ago  No chest pain no trouble breathing no headaches.  No cough.  No fevers or chills.  Just reports she feels very weak, weak in the knees   Past Medical History:  Diagnosis Date  . Anxiety   . Arthritis   . Broken arm    left FA 3/17  . Broken arm    left  . CAD (coronary artery disease)    s/p MI  . Cancer ([place])    uterine  . Cervical disc disorder   . Depression   . Diabetes mellitus without complication ([place])   . GERD (gastroesophageal reflux disease)   . Headache   . Hyperlipidemia   . Hypertension   . Hypothyroid   . Myocardial infarction ([place])    15 year ago  . Neck pain 06/04/2014  . Parkinson's disease ([place])   . Presence of permanent cardiac pacemaker   . Spinal stenosis     Patient Active Problem List   Diagnosis Date Noted  . UTI (urinary tract infection) 06/14/2020  . Type II diabetes mellitus with renal manifestations ([place]) 06/14/2020  .  Atrial fibrillation, chronic ([place]) 06/14/2020  . Acute renal 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[place]) 06/14/2020  . Lactic acidosis 06/14/2020  . Diarrhea   . Myoclonus   . Weakness   . AKI (acute kidney injury) ([place]) 05/16/2020  . Transaminitis 05/16/2020  . GI bleed 01/28/2020  . Rectal bleed 01/28/2020  . At risk for polypharmacy 01/28/2020  . Acute delirium   . HCAP (healthcare-associated pneumonia)   . Hypotension   . Hyponatremia   . Lobar pneumonia ([place]) 10/23/2019  . Atrial flutter ([place])   . Dizziness   . Atrial fibrillation status post cardioversion ([place]) 09/27/2019  . CHF (congestive heart failure) ([place]) 09/26/2019  . Chronic diastolic CHF (congestive heart failure) ([place]) 09/25/2019  . Hypertensive urgency 09/25/2019  . Fluid overload 09/25/2019  . Excessive gas 05/14/2019  . Generalized bloating 05/14/2019  . Bright red blood per rectum 05/14/2019  . 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 Chest pain 11/20/2018  . Difficulty walking 11/19/2018  . Insomnia 11/15/2018  . Type 2 diabetes mellitus with hypoglycemia without coma, with long-term current use of insulin ([place]) 11/15/2018  . Goals of care, counseling/discussion 02/06/2018  . 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 Rectal mass 01/05/2018  . Leg pain, bilateral 12/06/2017  .  Lymphedema 07/10/2017  . Atherosclerosis of native arteries of the extremities with ulceration ([place]) 07/10/2017  . Stricture and stenosis of esophagus   . Dysphagia   . Syncope and collapse 04/11/2017  . Mild dementia ([place]) 01/24/2017  . Lumbar facet osteoarthritis (Bilateral) 01/17/2017  . Acute postoperative pain 01/17/2017  . Hypokalemia 12/24/2016  . Anxiety 12/07/2016  . 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 Cerebrovascular accident ([place]) 12/07/2016  . Chronic depression 12/07/2016  . Essential hypertension 12/07/2016  . Hypothyroidism 12/07/2016  .  Mixed hyperlipidemia 12/07/2016  . Myocardial infarction ([place]) 12/07/2016  . TIA (transient ischemic attack) 11/17/2016  . Parkinson disease ([place]) 10/27/2016  . Tremor 10/27/2016  . Chronic knee pain (B) (R>L) 10/19/2016  . Ischemic chest pain ([place]) 09/29/2016  . Abnormal CT scan, lumbar spine 09/08/2016  . Lumbar foraminal stenosis (multilevel) 09/08/2016  . Fracture of clavicle 08/03/2016  . Polyneuropathy 05/17/2016  . Neurogenic pain 04/12/2016  . Chronic lower extremity pain (Bilateral) (R>L) 04/12/2016  . Chronic lower extremity radicular pain (Primary Source of Pain) (Bilateral) (R>L) 04/12/2016  . Lower extremity weakness 04/12/2016  . Chronic low back pain (Secondary source of pain) (Bilateral) (R>L) 04/12/2016  . Chronic wrist pain (Left) (secondary to fracture) 04/12/2016  . Lumbar spondylosis 04/12/2016  . Chronic pain syndrome 04/11/2016  . Long term current use of opiate analgesic 04/11/2016  . Long term prescription opiate use 04/11/2016  . Opiate use 04/11/2016  . Long term prescription benzodiazepine use 04/11/2016  . Lumbar radiculopathy (Multilevel) (Bilateral) 05/07/2015  . Lumbar facet syndrome (Bilateral) (R>L) 08/21/2014  . 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 Sacroiliac joint dysfunction (Bilateral) 08/13/2014  . Frequent falls 06/04/2014  . Memory loss 06/04/2014  . Anxiety and depression 08/22/2013  . CAD (coronary artery disease) 08/22/2013  . Therapeutic opioid-induced constipation (OIC) 08/22/2013  . GERD (gastroesophageal reflux disease) 08/22/2013    Past Surgical History:  Procedure Laterality Date  . ABDOMINAL HYSTERECTOMY     partial  . BREAST SURGERY    . CARDIOVERSION N/A 10/24/2019   Procedure: CARDIOVERSION;  Surgeon: Wellington Hampshire, MD;  Location: [facility] ORS;  Service: Cardiovascular;  Laterality: N/A;  . COLONOSCOPY    . COLONOSCOPY N/A 01/30/2020   Procedure: COLONOSCOPY;  Surgeon: Toledo, Benay Pike,  MD;  Location: [facility] ENDOSCOPY;  Service: Gastroenterology;  Laterality: N/A;  . COLONOSCOPY WITH PROPOFOL N/A 11/29/2017   Procedure: COLONOSCOPY WITH PROPOFOL;  Surgeon: [name], MD;  Location: [facility] ENDOSCOPY;  Service: Endoscopy;  Laterality: N/A;  . CORONARY ANGIOPLASTY     stent  . CORONARY ARTERY BYPASS GRAFT    . CORONARY STENT PLACEMENT    . ESOPHAGOGASTRODUODENOSCOPY (EGD) WITH PROPOFOL N/A 04/18/2017   Procedure: ESOPHAGOGASTRODUODENOSCOPY (EGD) WITH PROPOFOL;  Surgeon: [name], MD;  Location: [facility] ENDOSCOPY;  Service: Endoscopy;  Laterality: N/A;  . ESOPHAGOGASTRODUODENOSCOPY (EGD) WITH PROPOFOL N/A 11/29/2017   Procedure: ESOPHAGOGASTRODUODENOSCOPY (EGD) WITH PROPOFOL;  Surgeon: [name], MD;  Location: [facility] ENDOSCOPY;  Service: Endoscopy;  Laterality: N/A;  . FLEXIBLE SIGMOIDOSCOPY N/A 05/17/2020   Procedure: FLEXIBLE SIGMOIDOSCOPY;  Surgeon: Lin Landsman, MD;  Location: [facility] ENDOSCOPY;  Service: Gastroenterology;  Laterality: N/A;  . INSERT / REPLACE / REMOVE PACEMAKER    . PPM GENERATOR CHANGEOUT N/A 09/05/2019   Procedure: PPM GENERATOR CHANGEOUT;  Surgeon: [name], MD;  Location: [facility] CV LAB;  Service: Cardiovascular;  Laterality: N/A;  . s/p pacer insertion    . TRANSANAL EXCISION OF  RECTAL MASS N/A 01/17/2018   Procedure: TRANSANAL EXCISION OF RECTAL POLYP;  Surgeon: [name], MD;  Location: [facility] ORS;  Service: General;  Laterality: N/A;    Prior to Admission medications   Medication Sig Start Date End Date Taking? Authorizing Provider  amiodarone (PACERONE) 100 MG tablet Take 1 tablet (100 mg total) by mouth daily. 05/21/20  Yes Wieting, Richard, MD  cholecalciferol (VITAMIN D3) 25 MCG (1000 UNIT) tablet Take 1,000 Units by mouth daily.   Yes [provider]  Ferrous Sulfate (SLOW FE) 142 (45 Fe) MG TBCR Take 1 tablet by mouth twice daily 11/19/19  Yes [name], MD  gabapentin (NEURONTIN) 600 MG tablet Take  600-900 mg by mouth 3 (three) times daily. 05/04/20  Yes [provider]  hydrocortisone 2.5 % cream Apply 1 application topically 2 (two) times daily. 05/11/20  Yes [provider]  hydrOXYzine (ATARAX/VISTARIL) 25 MG tablet Take 25 mg by mouth 2 (two) times daily. 01/27/20  Yes [provider]  levothyroxine (SYNTHROID) 125 MCG tablet Take 125 mcg by mouth daily.  12/25/19  Yes [provider]  linaclotide (LINZESS) 72 MCG capsule Take 72 mcg by mouth daily before breakfast.   Yes [provider]  linagliptin (TRADJENTA) 5 MG TABS tablet Take 1 tablet (5 mg total) by mouth daily. 05/21/20  Yes Wieting, Richard, MD  loratadine (CLARITIN) 10 MG tablet Take 10 mg by mouth daily.    Yes [provider]  memantine (NAMENDA) 5 MG tablet Take 5 mg by mouth at bedtime. 05/04/20  Yes [provider]  metoprolol tartrate (LOPRESSOR) 25 MG tablet Take 12.5 mg by mouth daily. 04/28/20  Yes [provider]  montelukast (SINGULAIR) 10 MG tablet Take 10 mg by mouth at bedtime.  07/23/19  Yes [provider]  Multiple Vitamin (MULTIVITAMIN WITH MINERALS) TABS tablet Take 1 tablet by mouth daily.   Yes [provider]  omeprazole (PRILOSEC) 40 MG capsule Take 40 mg by mouth 2 (two) times daily. 10/22/18  Yes [provider]  potassium chloride SA (KLOR-CON) 20 MEQ tablet Take 1 tablet (20 mEq total) by mouth 2 (two) times daily. 05/21/20  Yes Wieting, Richard, MD  sertraline (ZOLOFT) 100 MG tablet Take 100 mg by mouth 2 (two) times daily. 11/19/18 04/24/30 Yes [provider]  torsemide (DEMADEX) 20 MG tablet Take 2 tablets (40 mg total) by mouth 2 (two) times daily. 04/24/20  Yes Kate Sable, MD  traZODone (DESYREL) 100 MG tablet Take 1 tablet (100 mg total) by mouth at bedtime. 10/28/19  Yes Wieting, Richard, MD  vitamin B-12 (CYANOCOBALAMIN) 1000 MCG tablet Take 1,000 mcg by mouth daily.   Yes [provider]   vitamin E 180 MG (400 UNITS) capsule Take 400 Units by mouth daily.   Yes [provider]  acetaminophen (TYLENOL) 325 MG tablet Take 2 tablets (650 mg total) by mouth every 6 (six) hours as needed for mild pain (or Fever >/= 101). 05/21/20   Loletha Grayer, MD  albuterol (VENTOLIN HFA) 108 (90 Base) MCG/ACT inhaler Inhale 2 puffs into the lungs every 6 (six) hours as needed for wheezing or shortness of breath. Patient not taking: No sig reported 10/28/19   Loletha Grayer, MD  cholestyramine (QUESTRAN) 4 g packet Take 1 packet (4 g total) by mouth daily. in water 10/28/19   Loletha Grayer, MD  donepezil (ARICEPT) 10 MG tablet Take 10 mg by mouth daily.     [provider]  HYDROcodone-acetaminophen (NORCO/VICODIN) 5-325 MG tablet Take 1 tablet by mouth every 8 (eight) hours as needed for moderate pain or severe pain. 05/21/20   Loletha Grayer, MD  hydrocortisone (ANUSOL-HC) 25 MG suppository Place 1 suppository (25 mg total) rectally 2 (two) times daily as needed for hemorrhoids or anal itching. Okay to substitute generic 05/21/20   Loletha Grayer, MD  meclizine (ANTIVERT) 12.5 MG tablet Take 1 tablet (12.5 mg total) by mouth 2 (two) times daily as needed for dizziness. 09/30/19   Jennye Boroughs, MD  nitroGLYCERIN (NITROSTAT) 0.4 MG SL tablet Place 0.4 mg under the tongue every 5 (five) minutes as needed for chest pain.     [provider]    Allergies Penicillins  Family History  Problem Relation Age of Onset  . Cancer Mother   . Depression Mother   . Diabetes Mother   . Hypertension Mother   . Aneurysm Mother   . Heart disease Father   . Diabetes Sister   . Diabetes Brother   . Diabetes Brother     Social History Social History   Tobacco Use  . Smoking status: Never Smoker  . Smokeless tobacco: Never Used  Vaping Use  . Vaping Use: Never used  Substance Use Topics  . Alcohol use: No  . Drug use: No    Review of Systems Constitutional: No  fever/chills Eyes: No visual changes. ENT: No sore throat. Cardiovascular: Denies chest pain. Respiratory: Denies shortness of breath. Gastrointestinal: No abdominal pain.  Feels a little nauseated but not any pain Genitourinary: See HPI Musculoskeletal: Negative for back pain. Skin: Negative for rash. Neurological: Negative for headaches, areas of focal weakness or numbness.  She reports she just feels generally weak in her muscles including both legs to the point she was only able to stand at her walker.  Lives by herself    ____________________________________________   PHYSICAL EXAM:  VITAL SIGNS: ED Triage Vitals  Enc Vitals Group     BP 06/14/20 0920 (!) 97/58     Pulse Rate 06/14/20 0920 71     Resp 06/14/20 0920 18     Temp 06/14/20 0920 98.3 F (36.8 C)     Temp Source 06/14/20 0920 Oral     SpO2 06/14/20 0920 96 %     Weight 06/14/20 0924 171 lb 15.3 oz (78 kg)     Height 06/14/20 0924 5\' 6"  (1.676 m)     Head Circumference --      Peak Flow --      Pain Score 06/14/20 0924 0     Pain Loc --      Pain Edu? --      Excl. in [place]? --     Constitutional: Alert and oriented.  Mildly ill-appearing but no acute distress.  Eyes: Conjunctivae are normal. Head: Atraumatic. Nose: No congestion/rhinnorhea. Mouth/Throat: Mucous membranes are dry. Neck: No stridor.  Cardiovascular: Normal rate, irregular rhythm. Grossly normal heart sounds.  Good peripheral circulation. Respiratory: Normal respiratory effort.  No retractions. Lungs CTAB. Gastrointestinal: Soft and nontender. No distention.  Reports mild nausea but no pain to palpation in any quadrant. Musculoskeletal: No lower extremity tenderness nor edema. Neurologic:  Normal speech and language. No gross focal neurologic deficits are appreciated.  Skin:  Skin is warm, dry and intact. No rash noted.  Patient's capillary beds in all digits of both hands seem slow and perfusion and slightly cool to touch. Psychiatric: Mood  and affect are normal. Speech and behavior are normal.  ____________________________________________   LABS (all labs ordered are listed, but only abnormal results are displayed)  Labs Reviewed  URINE CULTURE - Abnormal; Notable for the following components:      Result Value   Culture   (*)    Value: >=100,000 COLONIES/mL ESCHERICHIA COLI SUSCEPTIBILITIES TO FOLLOW Performed at [facility], [place] [address] [place], [place] [address]    All other components within normal limits  LACTIC ACID, PLASMA - Abnormal; Notable for the following components:   Lactic Acid, Venous 2.5 (*)    All other components within normal limits  COMPREHENSIVE METABOLIC PANEL - Abnormal; Notable for the following components:   Sodium 134 (*)    Potassium 2.2 (*)    Chloride 85 (*)    CO2 33 (*)    Glucose, Bld 149 (*)    BUN 43 (*)    Creatinine, Ser 2.07 (*)    Albumin 3.4 (*)    GFR, Estimated 24 (*)    Anion gap 16 (*)    All other components within normal limits  CBC WITH DIFFERENTIAL/PLATELET - Abnormal; Notable for the following components:   RBC 3.76 (*)    Hemoglobin 11.5 (*)    HCT 34.7 (*)    RDW 15.9 (*)    All other components within normal limits  URINALYSIS, COMPLETE (UACMP) WITH MICROSCOPIC - Abnormal; Notable for the following components:   Color, Urine YELLOW (*)    APPearance HAZY (*)    Hgb urine dipstick SMALL (*)    Leukocytes,Ua LARGE (*)    WBC, UA >50 (*)    Bacteria, UA MANY (*)    All other components within normal limits  LACTIC ACID, PLASMA - Abnormal; Notable for the following components:   Lactic Acid, Venous 3.2 (*)    All other components within normal limits  GLUCOSE, CAPILLARY - Abnormal; Notable for the following components:   Glucose-Capillary 198 (*)    All other components within normal limits  BASIC METABOLIC PANEL - Abnormal; Notable for the following components:   Chloride 92 (*)    Glucose, Bld 150 (*)    BUN 39 (*)    Creatinine, Ser  1.64 (*)    GFR, Estimated 31 (*)    All other components within normal limits  CBC - Abnormal; Notable for the following components:   RBC 3.29 (*)    Hemoglobin 9.9 (*)    HCT 30.5 (*)    RDW 16.0 (*)    Platelets 136 (*)    All other components within normal limits  LACTIC ACID, PLASMA - Abnormal; Notable for the following components:   Lactic Acid, Venous 0.3 (*)    All other components within normal limits  GLUCOSE, CAPILLARY - Abnormal; Notable for the following components:   Glucose-Capillary 153 (*)    All other components within normal limits  GLUCOSE, CAPILLARY - Abnormal; Notable for the following components:   Glucose-Capillary 159 (*)    All other components within normal limits  GLUCOSE, CAPILLARY - Abnormal; Notable for the following components:   Glucose-Capillary 209 (*)    All other components within normal limits  CBG MONITORING, ED - Abnormal; Notable for the following components:   Glucose-Capillary 150 (*)    All other components within normal limits  CULTURE, BLOOD (SINGLE)  SARS CORONAVIRUS 2 (TAT 6-24 HRS)  PROTIME-INR  APTT  LIPASE, BLOOD  MAGNESIUM  BRAIN NATRIURETIC PEPTIDE  LACTIC ACID, PLASMA   ____________________________________________  EKG  Reviewed inter  by me at 930 HR 66 Underlying A. fib, pacemaker dependent rhythm with some AV delay Morphology somewhat difficult to delineate, nonspecific T wave abnormality including a slight ST segment inversion lateral precordial leads.  Overall difficult to discern due to artifact and some tremulousness of the patient.  However, I do not see immediate concern for ischemia.  Patient also reports no chest pain ____________________________________________  RADIOLOGY  CT RENAL STONE STUDY  Result Date: 06/14/2020 CLINICAL DATA:  Renal failure. EXAM: CT ABDOMEN AND PELVIS WITHOUT CONTRAST TECHNIQUE: Multidetector CT imaging of the abdomen and pelvis was performed following the standard protocol  without IV contrast. COMPARISON:  None. FINDINGS: Lower chest: No acute abnormality. Hepatobiliary: No focal liver abnormality is seen. Focal fatty deposition noted within the medial segment of left hepatic lobe adjacent to falciform ligament, image 22/2. Gallbladder sludge. No gallstones, gallbladder wall inflammation or bile duct dilatation. Pancreas: Unremarkable. No pancreatic ductal dilatation or surrounding inflammatory changes. Spleen: Normal in size without focal abnormality. Adrenals/Urinary Tract: Normal adrenal glands. Kidneys are unremarkable. No kidney stone, mass or hydronephrosis identified bilaterally. No hydroureter or signs of ureteral lithiasis. Mild circumferential wall thickening is noted involving the bladder. No focal bladder abnormality. Stomach/Bowel: Moderate hiatal hernia. The appendix is visualized and appears normal. No bowel wall thickening, inflammation, or distension. Distal colonic diverticulosis without acute inflammation. Vascular/Lymphatic: Aortic atherosclerosis. No aneurysm. No abdominopelvic adenopathy. Reproductive: Status post hysterectomy. No adnexal masses. Other: No free fluid or fluid collection. Musculoskeletal: Scoliosis and degenerative disc disease. No acute or suspicious osseous findings. Previous ORIF of the right femur. IMPRESSION: 1. No acute findings within the abdomen or pelvis. No signs of obstructive uropathy. 2. Mild circumferential wall thickening is noted involving the bladder. Correlate for any clinical signs or symptoms of cystitis. 3. Hiatal hernia. 4. Aortic atherosclerosis. Aortic Atherosclerosis (ICD10-I70.0). Electronically Signed   By: Kerby Moors M.D.   On: 06/14/2020 18:28    Chest x-ray negative for acute ____________________________________________   PROCEDURES  Procedure(s) performed: None  Procedures  Critical Care performed: Yes, see critical care note(s)  CRITICAL CARE Performed by: Delman Kitten   Total critical care  time: 25 minutes  Critical care time was exclusive of separately billable procedures and treating other patients.  Critical care was necessary to treat or prevent imminent or life-threatening deterioration.  Critical care was time spent personally by me on the following activities: development of treatment plan with patient and/or surrogate as well as nursing, discussions with consultants, evaluation of patient's response to treatment, examination of patient, obtaining history from patient or surrogate, ordering and performing treatments and interventions, ordering and review of laboratory studies, ordering and review of radiographic studies, pulse oximetry and re-evaluation of patient's condition.  ____________________________________________   INITIAL IMPRESSION / ASSESSMENT AND PLAN / ED COURSE  Pertinent labs & imaging results that were available during my care of the patient were reviewed by me and considered in my medical decision making (see chart for details).   Patient presents with generalized weakness, fatigue and tremulousness.  Also reports a foul urine odor.  She appears somewhat poorly perfused peripherally with slow capillary refill in her hands.  Her mental status is normal.  Has mild hypotension.  She denies any acute neurologic cardiac or vascular complaints aside from tremulousness and generalized weakness no focal abnormalities.  I am concerned however that she may have associated infection or sepsis or an acute metabolic etiology.  Certainly her differential diagnosis is broad.  Will initiate broad work-up here  After work-up including h hypokalemia has returned, decision made to admit to the hospitalist.  Is always tending to a CODE BLUE on the hospital floor, hospitalist contact me and did notify me that the patient also appear to have a urinary tract infection.  Hospitalist will start treatment for this.  Admission decision made, and patient agreeable with understanding  plan for admission.  Ingestion discussed with Dr. Blaine Hamper ____________________________________________   FINAL CLINICAL IMPRESSION(S) / ED DIAGNOSES  Final diagnoses:  Hypokalemia  Urinary tract infection, acute  General weakness        Note:  This document was prepared using Dragon voice recognition software and may include unintentional dictation errors       Delman Kitten, MD 06/15/20 1222

## 2020-06-14 NOTE — ED Notes (Addendum)
MD Quale notified about K 2.2 and lactic acid 2.5.

## 2020-06-14 NOTE — Consult Note (Signed)
Pharmacy Antibiotic Note  Kristi [name] is a 80 y.o. female admitted on 06/14/2020 with UTI. Pt presented from home with tremors. Pt has a history of potassium issues, K on admission 2.2 - being replaced. Pt has foul smelling urine and reports being sick to stomach x 3 days. Pharmacy has been consulted for aztreonam dosing. Pt with allergy to penicillins with anaphylaxis as reaction. Pt tolerates ceftriaxone. MD okay with change from aztreonam to ceftriaxone for UTI. Pharmacy will dose ceftriaxone for UTI.  Plan: -- Ordered ceftriaxone 2 g IV q24h   Height: 5\' 6"  (167.6 cm) Weight: 78 kg (171 lb 15.3 oz) IBW/kg (Calculated) : 59.3  Temp (24hrs), Avg:98.3 F (36.8 C), Min:98.3 F (36.8 C), Max:98.3 F (36.8 C)  Recent Labs  Lab 06/14/20 0952  WBC 6.6  CREATININE 2.07*  LATICACIDVEN 2.5*    Estimated Creatinine Clearance: 22.5 mL/min (A) (by C-G formula based on SCr of 2.07 mg/dL (H)).    Allergies  Allergen Reactions  . Penicillins Anaphylaxis, Swelling and Rash    TOLERATES CEFTRIAXONE    Antimicrobials this admission: 3/27 ceftriaxone >>  Microbiology results: 3/27 BCx: pending 3/27 UCx: pending  Thank you for allowing pharmacy to be a part of this patient's care.  Benn Moulder, PharmD Pharmacy Resident  06/14/2020 11:46 AM
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
## 2020-06-15 DIAGNOSIS — Z950 Presence of cardiac pacemaker: Secondary | ICD-10-CM | POA: Diagnosis not present

## 2020-06-15 DIAGNOSIS — F419 Anxiety disorder, unspecified: Secondary | ICD-10-CM | POA: Diagnosis not present

## 2020-06-15 DIAGNOSIS — I10 Essential (primary) hypertension: Secondary | ICD-10-CM | POA: Diagnosis not present

## 2020-06-15 DIAGNOSIS — Z1611 Resistance to penicillins: Secondary | ICD-10-CM | POA: Diagnosis present

## 2020-06-15 DIAGNOSIS — N1832 Chronic kidney disease, stage 3b: Secondary | ICD-10-CM | POA: Diagnosis present

## 2020-06-15 DIAGNOSIS — I5032 Chronic diastolic (congestive) heart failure: Secondary | ICD-10-CM | POA: Diagnosis present

## 2020-06-15 DIAGNOSIS — Z79899 Other long term (current) drug therapy: Secondary | ICD-10-CM | POA: Diagnosis not present

## 2020-06-15 DIAGNOSIS — N179 Acute kidney failure, unspecified: Secondary | ICD-10-CM | POA: Diagnosis present

## 2020-06-15 DIAGNOSIS — I482 Chronic atrial fibrillation, unspecified: Secondary | ICD-10-CM | POA: Diagnosis present

## 2020-06-15 DIAGNOSIS — E785 Hyperlipidemia, unspecified: Secondary | ICD-10-CM | POA: Diagnosis present

## 2020-06-15 DIAGNOSIS — Z8673 Personal history of transient ischemic attack (TIA), and cerebral infarction without residual deficits: Secondary | ICD-10-CM | POA: Diagnosis not present

## 2020-06-15 DIAGNOSIS — E876 Hypokalemia: Secondary | ICD-10-CM | POA: Diagnosis not present

## 2020-06-15 DIAGNOSIS — Z79890 Hormone replacement therapy (postmenopausal): Secondary | ICD-10-CM | POA: Diagnosis not present

## 2020-06-15 DIAGNOSIS — I252 Old myocardial infarction: Secondary | ICD-10-CM | POA: Diagnosis not present

## 2020-06-15 DIAGNOSIS — Z8542 Personal history of malignant neoplasm of other parts of uterus: Secondary | ICD-10-CM | POA: Diagnosis not present

## 2020-06-15 DIAGNOSIS — Z955 Presence of coronary angioplasty implant and graft: Secondary | ICD-10-CM | POA: Diagnosis not present

## 2020-06-15 DIAGNOSIS — F0280 Dementia in other diseases classified elsewhere without behavioral disturbance: Secondary | ICD-10-CM | POA: Diagnosis present

## 2020-06-15 DIAGNOSIS — Z90710 Acquired absence of both cervix and uterus: Secondary | ICD-10-CM | POA: Diagnosis not present

## 2020-06-15 DIAGNOSIS — I130 Hypertensive heart and chronic kidney disease with heart failure and stage 1 through stage 4 chronic kidney disease, or unspecified chronic kidney disease: Secondary | ICD-10-CM | POA: Diagnosis present

## 2020-06-15 DIAGNOSIS — E039 Hypothyroidism, unspecified: Secondary | ICD-10-CM | POA: Diagnosis present

## 2020-06-15 DIAGNOSIS — N3000 Acute cystitis without hematuria: Secondary | ICD-10-CM | POA: Diagnosis not present

## 2020-06-15 DIAGNOSIS — N390 Urinary tract infection, site not specified: Secondary | ICD-10-CM | POA: Diagnosis present

## 2020-06-15 DIAGNOSIS — K219 Gastro-esophageal reflux disease without esophagitis: Secondary | ICD-10-CM | POA: Diagnosis present

## 2020-06-15 DIAGNOSIS — G20 Parkinson's disease: Secondary | ICD-10-CM | POA: Diagnosis present

## 2020-06-15 DIAGNOSIS — Z951 Presence of aortocoronary bypass graft: Secondary | ICD-10-CM | POA: Diagnosis not present

## 2020-06-15 DIAGNOSIS — E872 Acidosis: Secondary | ICD-10-CM | POA: Diagnosis present

## 2020-06-15 DIAGNOSIS — I2510 Atherosclerotic heart disease of native coronary artery without angina pectoris: Secondary | ICD-10-CM | POA: Diagnosis present

## 2020-06-15 DIAGNOSIS — Z20822 Contact with and (suspected) exposure to covid-19: Secondary | ICD-10-CM | POA: Diagnosis present

## 2020-06-15 LAB — BASIC METABOLIC PANEL
Anion gap: 12 (ref 5–15)
BUN: 39 mg/dL — ABNORMAL HIGH (ref 8–23)
CO2: 32 mmol/L (ref 22–32)
Calcium: 9.1 mg/dL (ref 8.9–10.3)
Chloride: 92 mmol/L — ABNORMAL LOW (ref 98–111)
Creatinine, Ser: 1.64 mg/dL — ABNORMAL HIGH (ref 0.44–1.00)
GFR, Estimated: 31 mL/min — ABNORMAL LOW (ref >60)
Glucose, Bld: 150 mg/dL — ABNORMAL HIGH (ref 70–99)
Potassium: 3.5 mmol/L (ref 3.5–5.1)
Sodium: 136 mmol/L (ref 135–145)

## 2020-06-15 LAB — CBC
HCT: 30.5 % — ABNORMAL LOW (ref 36.0–46.0)
Hemoglobin: 9.9 g/dL — ABNORMAL LOW (ref 12.0–15.0)
MCH: 30.1 pg (ref 26.0–34.0)
MCHC: 32.5 g/dL (ref 30.0–36.0)
MCV: 92.7 fL (ref 80.0–100.0)
Platelets: 136 10*3/uL — ABNORMAL LOW (ref 150–400)
RBC: 3.29 MIL/uL — ABNORMAL LOW (ref 3.87–5.11)
RDW: 16 % — ABNORMAL HIGH (ref 11.5–15.5)
WBC: 5.8 10*3/uL (ref 4.0–10.5)
nRBC: 0 % (ref 0.0–0.2)

## 2020-06-15 LAB — GLUCOSE, CAPILLARY
Glucose-Capillary: 159 mg/dL — ABNORMAL HIGH (ref 70–99)
Glucose-Capillary: 209 mg/dL — ABNORMAL HIGH (ref 70–99)
Glucose-Capillary: 112 mg/dL — ABNORMAL HIGH (ref 70–99)
Glucose-Capillary: 129 mg/dL — ABNORMAL HIGH (ref 70–99)

## 2020-06-15 LAB — LACTIC ACID, PLASMA: Lactic Acid, Venous: 1.6 mmol/L (ref 0.5–1.9)

## 2020-06-15 LAB — SARS CORONAVIRUS 2 (TAT 6-24 HRS): SARS Coronavirus 2: NEGATIVE

## 2020-06-15 MED ORDER — ENOXAPARIN SODIUM 30 MG/0.3ML ~~LOC~~ SOLN
30.0000 mg | SUBCUTANEOUS | Status: DC
  Administered 2020-06-15: 30 mg via SUBCUTANEOUS
  Filled 2020-06-15: qty 0.3

## 2020-06-15 NOTE — Progress Notes (Signed)
OT Cancellation Note  Patient Details Name: Kristi [name] MRN: [id number] DOB: 08-04-1940   Cancelled Treatment:    Reason Eval/Treat Not Completed: Other (comment). Consult received, chart reviewed. Pt just finished working with PT. RN requesting OT return at later time to give additional time for pt to rest and recover as well as pt hooked up to IV. Will re-attempt at later date/time as medically appropriate and pt available.  Hanley Hays, MPH, MS, OTR/L ascom [phone] 06/15/20, 2:44 PM

## 2020-06-15 NOTE — Hospital Course (Signed)
Kristi [name] is a 80 y.o. female with multiple medical problems including Parkinson's disease, CAD s/p MI, uterine cancer, dCHF, atrial fibrillation not on anticoagulants, chronic pain syndrome, GI bleeding, dementia, iron deficiency anemia, chronic/recurrent hypokalemia who presented to ED on 06/14/20 with generalized weakness, dysuria and burning on urination.  Also reported shaking chills, difficulty ambulating and nausea.  UA obtained in the ED is consistent with UTI. Patient admitted to hospital and started on empiric Rocephin pending culture results.

## 2020-06-15 NOTE — Clinical Social Work Note (Signed)
CSW acknowledges consult: "Per pt has a aide that comes in daily few hours a week. Pt lives home alone. Per pt has walker and cane at home." Will follow for discharge needs. Per chart review, patient admitted to [facility] on 3/3 but recently discharged home.  Dayton Scrape, [place]

## 2020-06-15 NOTE — Evaluation (Signed)
Physical Therapy Evaluation Patient Details Name: Kristi [name] MRN: [id number] DOB: February 17, 1941 Today's Date: 06/15/2020   History of Present Illness  Pt is an 80 y.o. female with medical history significant of hypertension, hyperlipidemia, diabetes mellitus, stroke, GERD, hypothyroidism, depression with anxiety, pacemaker placement, Parkinson's disease, CAD, myocardial infarction, uterine cancer, dCHF, atrial fibrillation not on anticoagulants, chronic pain syndrome, GI bleeding, dementia, iron deficiency anemia, who presents with generalized weakness, dysuria and burning on urination. Recent admission ~70mo ago.    Clinical Impression  Patient alert, agreeable to PT, HOH. Denied pain. BP assessed multiple times during session due to hypotension noted, readings in 90s/50s for majority of session. Pt reported recently returning home, one story home with family to assist PRN, has an aide that comes 3-4 hrs a day (has not had a chance to start yet). Pt reported after leaving rehab she was walking ~44ft with RW.  The patient demonstrated supine to sit with minA. Several minutes in sitting with poor-fair balance noted, pt with posterior lean with CGa-minA to correct intermittently. Pt reported initially being light headed, spO2 and BP WFLs. Sit <> stand performed twice with RW, ultimately minA to come up into standing and maintain balance ,transferred to recliner. Pt fatigued, and due to low BP readings (90s/50s) further mobility deferred.  Overall the patient demonstrated deficits (see "PT Problem List") that impede the patient's functional abilities, safety, and mobility and would benefit from skilled PT intervention. Recommendation is SNF due to current level of assistance needed and to maximize safety, mobility, function, and independence.      Follow Up Recommendations SNF    Equipment Recommendations  None recommended by PT    Recommendations for Other Services       Precautions / Restrictions  Precautions Precautions: Fall Restrictions Weight Bearing Restrictions: No      Mobility  Bed Mobility Overal bed mobility: Needs Assistance Bed Mobility: Supine to Sit     Supine to sit: HOB elevated;Min assist     General bed mobility comments: minA to assist with weight shift to EOB    Transfers Overall transfer level: Needs assistance Equipment used: Rolling walker (2 wheeled) Transfers: Sit to/from Stand Sit to Stand: Min assist         General transfer comment: performed twice, unable to perform without physical assist. second attempt able to maintain standing and transfer to recliner in room  Ambulation/Gait             General Gait Details: 2-3 steps to transfer to recliner in room, true ambulation deferred due to pt fatigue, low BP  Stairs            Wheelchair Mobility    Modified Rankin (Stroke Patients Only)       Balance Overall balance assessment: Needs assistance Sitting-balance support: Feet supported Sitting balance-Leahy Scale: Poor Sitting balance - Comments: BUE support, occasional posterior lean noted with CGA-minA to correct Postural control: Posterior lean   Standing balance-Leahy Scale: Poor Standing balance comment: reliant on RW support and minA from PT                             Pertinent Vitals/Pain Pain Assessment: No/denies pain    Home Living Family/patient expects to be discharged to:: Private residence Living Arrangements: Alone Available Help at Discharge: Family;Available PRN/intermittently;Other (Comment);Personal care attendant (daughter works, unable to assist full time. Aide to start to assist 3-4 hrs a day durign the week but  did not have a chance to start) Type of Home: Mobile home Home Access: Ramped entrance     Home Layout: One level Home Equipment: [place] - 4 wheels;Toilet riser;Wheelchair - manual;Hospital bed      Prior Function Level of Independence: Needs assistance   Gait /  Transfers Assistance Needed: RW used for household distances, does nto tolerate community distances  ADL's / Homemaking Assistance Needed: DTR helps with grocereis and meals.  Comments: Pt with recent stay at rehab, was home for 2days only     Hand Dominance   Dominant Hand: Right    Extremity/Trunk Assessment   Upper Extremity Assessment Upper Extremity Assessment: Defer to OT evaluation    Lower Extremity Assessment Lower Extremity Assessment: Generalized weakness    Cervical / Trunk Assessment Cervical / Trunk Assessment: Normal  Communication   Communication: HOH  Cognition Arousal/Alertness: Awake/alert Behavior During Therapy: WFL for tasks assessed/performed Overall Cognitive Status: Within Functional Limits for tasks assessed                                        General Comments      Exercises Other Exercises Other Exercises: BP assessed multiple times over session to ensure pt was not orthostatic; low BP noted but not positive for hypotension   Assessment/Plan    PT Assessment Patient needs continued PT services  PT Problem List Decreased strength;Decreased range of motion;Decreased activity tolerance;Decreased balance;Decreased mobility;Decreased knowledge of precautions;Decreased knowledge of use of DME       PT Treatment Interventions DME instruction;Balance training;Gait training;Neuromuscular re-education;Stair training;Functional mobility training;Patient/family education;Therapeutic activities;Therapeutic exercise    PT Goals (Current goals can be found in the Care Plan section)  Acute Rehab PT Goals Patient Stated Goal: to go home PT Goal Formulation: With patient Time For Goal Achievement: 06/29/20 Potential to Achieve Goals: Good    Frequency Min 2X/week   Barriers to discharge        Co-evaluation               AM-PAC PT "6 Clicks" Mobility  Outcome Measure Help needed turning from your back to your side while  in a flat bed without using bedrails?: A Little Help needed moving from lying on your back to sitting on the side of a flat bed without using bedrails?: A Little Help needed moving to and from a bed to a chair (including a wheelchair)?: A Little Help needed standing up from a chair using your arms (e.g., wheelchair or bedside chair)?: A Little Help needed to walk in hospital room?: A Lot Help needed climbing 3-5 steps with a railing? : A Lot 6 Click Score: 16    End of Session Equipment Utilized During Treatment: Gait belt Activity Tolerance: Patient tolerated treatment well;Treatment limited secondary to medical complications (Comment);Other (comment) (limited by BP) Patient left: in chair;with nursing/sitter in room;with call bell/phone within reach Nurse Communication: Mobility status PT Visit Diagnosis: Other abnormalities of gait and mobility (R26.89);Muscle weakness (generalized) (M62.81)    Time: 9794-8016 PT Time Calculation (min) (ACUTE ONLY): 30 min   Charges:   PT Evaluation $PT Eval Low Complexity: 1 Low PT Treatments $Therapeutic Exercise: 8-22 mins       Lieutenant Diego PT, DPT 4:02 PM,06/15/20
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
## 2020-06-16 LAB — BASIC METABOLIC PANEL
Anion gap: 7 (ref 5–15)
BUN: 29 mg/dL — ABNORMAL HIGH (ref 8–23)
CO2: 35 mmol/L — ABNORMAL HIGH (ref 22–32)
Calcium: 8.9 mg/dL (ref 8.9–10.3)
Chloride: 95 mmol/L — ABNORMAL LOW (ref 98–111)
Creatinine, Ser: 1.16 mg/dL — ABNORMAL HIGH (ref 0.44–1.00)
GFR, Estimated: 47 mL/min — ABNORMAL LOW (ref >60)
Glucose, Bld: 146 mg/dL — ABNORMAL HIGH (ref 70–99)
Potassium: 2.8 mmol/L — ABNORMAL LOW (ref 3.5–5.1)
Sodium: 137 mmol/L (ref 135–145)

## 2020-06-16 LAB — CBC
HCT: 29.1 % — ABNORMAL LOW (ref 36.0–46.0)
Hemoglobin: 9.6 g/dL — ABNORMAL LOW (ref 12.0–15.0)
MCH: 30.6 pg (ref 26.0–34.0)
MCHC: 33 g/dL (ref 30.0–36.0)
MCV: 92.7 fL (ref 80.0–100.0)
Platelets: 126 10*3/uL — ABNORMAL LOW (ref 150–400)
RBC: 3.14 MIL/uL — ABNORMAL LOW (ref 3.87–5.11)
RDW: 16.1 % — ABNORMAL HIGH (ref 11.5–15.5)
WBC: 5.3 10*3/uL (ref 4.0–10.5)
nRBC: 0 % (ref 0.0–0.2)

## 2020-06-16 LAB — URINE CULTURE: Culture: >=100000 — AB

## 2020-06-16 LAB — GLUCOSE, CAPILLARY
Glucose-Capillary: 158 mg/dL — ABNORMAL HIGH (ref 70–99)
Glucose-Capillary: 152 mg/dL — ABNORMAL HIGH (ref 70–99)
Glucose-Capillary: 116 mg/dL — ABNORMAL HIGH (ref 70–99)
Glucose-Capillary: 149 mg/dL — ABNORMAL HIGH (ref 70–99)

## 2020-06-16 LAB — MAGNESIUM: Magnesium: 1.9 mg/dL (ref 1.7–2.4)

## 2020-06-16 MED ORDER — DONEPEZIL HCL 5 MG PO TABS
10.0000 mg | ORAL_TABLET | Freq: Every day | ORAL | Status: DC
  Administered 2020-06-16 – 2020-06-19 (×4): 10 mg via ORAL
  Filled 2020-06-16 (×4): qty 2

## 2020-06-16 MED ORDER — ENOXAPARIN SODIUM 40 MG/0.4ML ~~LOC~~ SOLN
40.0000 mg | SUBCUTANEOUS | Status: DC
  Administered 2020-06-16: 40 mg via SUBCUTANEOUS
  Filled 2020-06-16 (×2): qty 0.4

## 2020-06-16 MED ORDER — POTASSIUM CHLORIDE CRYS ER 20 MEQ PO TBCR
40.0000 meq | EXTENDED_RELEASE_TABLET | ORAL | Status: AC
  Administered 2020-06-16 (×3): 40 meq via ORAL
  Filled 2020-06-16 (×3): qty 2

## 2020-06-16 NOTE — Progress Notes (Signed)
PHARMACIST - PHYSICIAN COMMUNICATION  CONCERNING:  Enoxaparin (Lovenox) for DVT Prophylaxis    RECOMMENDATION: Patient was prescribed enoxaparin 40 mg q24 hours for VTE prophylaxis.   Filed Weights   06/14/20 0924 06/15/20 0419 06/16/20 0500  Weight: 78 kg (171 lb 15.3 oz) 86.5 kg (190 lb 11.2 oz) 88.7 kg (195 lb 8.8 oz)    Body mass index is 31.56 kg/m.  Estimated Creatinine Clearance: 42.7 mL/min (A) (by C-G formula based on SCr of 1.16 mg/dL (H)).   Based on [place] patient is candidate for enoxaparin 40 mg every 24 hours based on CrCl > 32ml/min  DESCRIPTION: Pharmacy has adjusted enoxaparin dose per [facility] policy.  Patient is now receiving enoxaparin 40 mg every 24 hours    Tawnya Crook, PharmD Clinical Pharmacist  06/16/2020 5:00 PM

## 2020-06-16 NOTE — Progress Notes (Signed)
PROGRESS NOTE    Kristi [name]   ZOX:[id number]  DOB: [birth year]  PCP: [place] [place]    DOA: 06/14/2020 LOS: 1   Brief Narrative   Kristi [name] is a 80 y.o. female with multiple medical problems including Parkinson's disease, CAD s/p MI, uterine cancer, dCHF, atrial fibrillation not on anticoagulants, chronic pain syndrome, GI bleeding, dementia, iron deficiency anemia, chronic/recurrent hypokalemia who presented to ED on 06/14/20 with generalized weakness, dysuria and burning on urination.  Also reported shaking chills, difficulty ambulating and nausea.  UA obtained in the ED is consistent with UTI. Patient admitted to hospital and started on empiric Rocephin pending culture results.    Assessment & Plan   Principal Problem:   UTI (urinary tract infection) Active Problems:   Hypokalemia   Essential hypertension   Anxiety and depression   CAD (coronary artery disease)   GERD (gastroesophageal reflux disease)   Hypothyroidism   Chronic diastolic CHF (congestive heart failure) (HCC)   Type II diabetes mellitus with renal manifestations (HCC)   Atrial fibrillation, chronic (HCC)   Acute renal 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HCC)   Lactic acidosis   UTI -present on admission, did not meet criteria for sepsis.  Mild lactate elevation resolved.  CT renal stone protocol showed no acute findings in the abdomen or pelvis, no obstructive uropathy, mild circumferential wall thickening of the bladder consistent with cystitis. --Continue empiric Rocephin --Follow-up blood and urine cultures  Hypokalemia -present with potassium 2.2 History of recurrent hypoK, requiring aggressive replacement. 3/28 K within normal 3/29 recurrent K 2.8 - giving replacement --Daily BMP and magnesium levels --Replace K and Mg for goal K>4.0 and Mg>2.0  Lactic acidosis -lactate 2.5 on presentation, resolved with IV hydration.  Likely due to dehydration given chest and soft  blood pressure.  AKI superimposed on CKD stage IIIb -presented with creatinine 2.07, BUN 43.  Likely secondary to UTI.  Recent baseline creatinine on 3/18 was 0.93. Improving: today Cr 1.16 --Continue IV fluids --Hold torsemide --Monitor BP  Type 2 diabetes with renal manifestations -recent A1c 7.7%, uncontrolled.  Patient on Tradjenta, held. --Sliding scale NovoLog  Peripheral neuropathy -continue gabapentin  Coronary artery disease -stable with no chest pain. --Sublingual nitro as needed  Atrial fibrillation -not on anticoagulants due to history of GI bleeding.  Rate is controlled. --Continue amiodarone --Hold metoprolol due to soft BP  Chronic diastolic CHF -Echo 10/29/9145 showed EF 50 to 55%.  Currently appears compensated has trace lower extremity edema but no respiratory symptoms.  BNP normal. --Hold torsemide due to AKI and soft BP  ?COPD/Asthma / Allergic rhinitis -continue home albuterol as needed, Claritin, Singulair  GERD -continue Protonix  Essential hypertension -metoprolol and torsemide are held due to soft BP  Hypothyroidism -continue Synthroid  Anxiety and depression -continued on as needed hydroxyzine, Zoloft, trazodone  History of dizziness/vertigo -continue on home meclizine as needed  History of iron deficiency -continue oral supplement  Parkinson's disease  / dementia -continue Namenda, Aricept  Hemorrhoids -no acute issues.  Continue Anusol suppository as needed   Obesity: Body mass index is 31.56 kg/m.   Complicates overall care and prognosis.  Recommend lifestyle modifications including physical activity and diet for weight loss and overall long-term health.   DVT prophylaxis: enoxaparin (LOVENOX) injection 30 mg Start: 06/15/20 1800 SCDs Start: 06/14/20 1432   Diet:  Diet Orders (From admission, onward)    Start     Ordered   06/14/20 1432  Diet heart healthy/carb  modified Room service appropriate? Yes; Fluid consistency: Thin  Diet  effective now       Question Answer Comment  Diet-HS Snack? Nothing   Room service appropriate? Yes   Fluid consistency: Thin      06/14/20 1431            Code Status: Full Code    Subjective 06/16/20    Pt seen at bedside. She says she doesn't feel well today, very fatigued and worn out feeling.  Has upset stomach but denies vomiting.  Also denies pain, fever/chills, CP or SOB.   Disposition Plan & Communication   Status is: Inpatient  Inpatient status appropriate due to severity of illness requiring IV therapies as above, ongoing electrolyte derangements requiring further close monitoring for stability.  Dispo: The patient is from: home              Anticipated d/c is to: home              Patient currently not medically stable for dc   Difficult to place patient No    Consults, Procedures, Significant Events   Consultants:   none  Procedures:   none  Antimicrobials:  Anti-infectives (From admission, onward)   Start     Dose/Rate Route Frequency Ordered Stop   06/14/20 1200  cefTRIAXone (ROCEPHIN) 2 g in sodium chloride 0.9 % 100 mL IVPB        2 g 200 mL/hr over 30 Minutes Intravenous Every 24 hours 06/14/20 1138          Micro    Objective   Vitals:   06/16/20 0500 06/16/20 0832 06/16/20 1204 06/16/20 1523  BP:  (!) 106/44 108/60 101/89  Pulse:  61 (!) 59 (!) 59  Resp:  16    Temp:  99.1 F (37.3 C) 98 F (36.7 C) 98.2 F (36.8 C)  TempSrc:  Oral  Oral  SpO2:  93% 99% 100%  Weight: 88.7 kg     Height:        Intake/Output Summary (Last 24 hours) at 06/16/2020 1611 Last data filed at 06/16/2020 1503 Gross per 24 hour  Intake 2115.99 ml  Output 1500 ml  Net 615.99 ml   Filed Weights   06/14/20 0924 06/15/20 0419 06/16/20 0500  Weight: 78 kg 86.5 kg 88.7 kg    Physical Exam:  General exam: awake, alert, no acute distress Respiratory system: CTAB, diminished bases, normal respiratory effort. Cardiovascular system: normal S1/S2,  RRR Gastrointestinal system: soft, NT, ND, +bowel sounds. Extremities: stable trace b/l LE edema, no cyanosis, normal tone Skin: dry, intact, normal temperature Psychiatry: normal mood, congruent affect  Labs   Data Reviewed: I have personally reviewed following labs and imaging studies  CBC: Recent Labs  Lab 06/14/20 0952 06/15/20 0425 06/16/20 0414  WBC 6.6 5.8 5.3  NEUTROABS 4.6  --   --   HGB 11.5* 9.9* 9.6*  HCT 34.7* 30.5* 29.1*  MCV 92.3 92.7 92.7  PLT 166 136* 086*   Basic Metabolic Panel: Recent Labs  Lab 06/14/20 0952 06/15/20 0425 06/16/20 0609  NA 134* 136 137  K 2.2* 3.5 2.8*  CL 85* 92* 95*  CO2 33* 32 35*  GLUCOSE 149* 150* 146*  BUN 43* 39* 29*  CREATININE 2.07* 1.64* 1.16*  CALCIUM 9.1 9.1 8.9  MG 2.0  --  1.9   GFR: Estimated Creatinine Clearance: 42.7 mL/min (A) (by C-G formula based on SCr of 1.16 mg/dL (H)). Liver Function Tests: Recent  Labs  Lab 06/14/20 0952  AST 32  ALT 33  ALKPHOS 79  BILITOT 0.5  PROT 6.9  ALBUMIN 3.4*   Recent Labs  Lab 06/14/20 0952  LIPASE 47   No results for input(s): AMMONIA in the last 168 hours. Coagulation Profile: Recent Labs  Lab 06/14/20 0952  INR 1.2   Cardiac Enzymes: No results for input(s): CKTOTAL, CKMB, CKMBINDEX, TROPONINI in the last 168 hours. BNP (last 3 results) No results for input(s): PROBNP in the last 8760 hours. HbA1C: No results for input(s): HGBA1C in the last 72 hours. CBG: Recent Labs  Lab 06/15/20 1146 06/15/20 1646 06/15/20 2206 06/16/20 0728 06/16/20 1203  GLUCAP 209* 112* 129* 158* 152*   Lipid Profile: No results for input(s): CHOL, HDL, LDLCALC, TRIG, CHOLHDL, LDLDIRECT in the last 72 hours. Thyroid Function Tests: No results for input(s): TSH, T4TOTAL, FREET4, T3FREE, THYROIDAB in the last 72 hours. Anemia Panel: No results for input(s): VITAMINB12, FOLATE, FERRITIN, TIBC, IRON, RETICCTPCT in the last 72 hours. Sepsis Labs: Recent Labs  Lab  06/14/20 1937 06/14/20 1457 06/14/20 2227 06/15/20 0116  LATICACIDVEN 2.5* 3.2* 0.3* 1.6    Recent Results (from the past 240 hour(s))  Blood culture (routine single)     Status: None (Preliminary result)   Collection Time: 06/14/20  9:52 AM   Specimen: BLOOD  Result Value Ref Range Status   Specimen Description BLOOD RIGHT ASSIST CONTROL  Final   Special Requests   Final    BOTTLES DRAWN AEROBIC AND ANAEROBIC Blood Culture results may not be optimal due to an excessive volume of blood received in culture bottles   Culture   Final    NO GROWTH 2 DAYS Performed at [facility], [address], [place], [place] [address]    Report Status PENDING  Incomplete  Urine culture     Status: Abnormal   Collection Time: 06/14/20  9:52 AM   Specimen: Urine, Random  Result Value Ref Range Status   Specimen Description   Final    URINE, RANDOM Performed at [facility], [address], [place], [place] [address]    Special Requests   Final    NONE Performed at [facility], [address] [place], [place] [address]    Culture >=100,000 COLONIES/mL ESCHERICHIA COLI (A)  Final   Report Status 06/16/2020 FINAL  Final   Organism ID, Bacteria ESCHERICHIA COLI (A)  Final      Susceptibility   Escherichia coli - MIC*    AMPICILLIN >=32 RESISTANT Resistant     CEFAZOLIN <=4 SENSITIVE Sensitive     CEFEPIME <=0.12 SENSITIVE Sensitive     CEFTRIAXONE <=0.25 SENSITIVE Sensitive     CIPROFLOXACIN <=0.25 SENSITIVE Sensitive     GENTAMICIN <=1 SENSITIVE Sensitive     IMIPENEM <=0.25 SENSITIVE Sensitive     NITROFURANTOIN <=16 SENSITIVE Sensitive     TRIMETH/SULFA <=20 SENSITIVE Sensitive     AMPICILLIN/SULBACTAM >=32 RESISTANT Resistant     PIP/TAZO <=4 SENSITIVE Sensitive     * >=100,000 COLONIES/mL ESCHERICHIA COLI  SARS CORONAVIRUS 2 (TAT 6-24 HRS) Nasopharyngeal Nasopharyngeal Swab     Status: None   Collection Time: 06/14/20 11:54 AM   Specimen:  Nasopharyngeal Swab  Result Value Ref Range Status   SARS Coronavirus 2 NEGATIVE NEGATIVE Final    Comment: (NOTE) SARS-CoV-2 target nucleic acids are NOT DETECTED.  The SARS-CoV-2 RNA is generally detectable in upper and lower respiratory specimens during the acute phase of infection. Negative  results do not preclude SARS-CoV-2 infection, do not rule out co-infections with other pathogens, and should not be used as the sole basis for treatment or other patient management decisions. Negative results must be combined with clinical observations, patient history, and epidemiological information. The expected result is Negative.  Fact Sheet for Patients: SugarRoll.be  Fact Sheet for Healthcare Providers: https://www.woods-mathews.com/  This test is not yet approved or cleared by the [place] FDA and  has been authorized for detection and/or diagnosis of SARS-CoV-2 by FDA under an Emergency Use Authorization (EUA). This EUA will remain  in effect (meaning this test can be used) for the duration of the COVID-19 declaration under Se ction 564(b)(1) of the Act, 21 U.S.C. section 360bbb-3(b)(1), unless the authorization is terminated or revoked sooner.  Performed at [facility], [place] [address], [place], [place] [address]       Imaging Studies   CT RENAL STONE STUDY  Result Date: 06/14/2020 CLINICAL DATA:  Renal failure. EXAM: CT ABDOMEN AND PELVIS WITHOUT CONTRAST TECHNIQUE: Multidetector CT imaging of the abdomen and pelvis was performed following the standard protocol without IV contrast. COMPARISON:  None. FINDINGS: Lower chest: No acute abnormality. Hepatobiliary: No focal liver abnormality is seen. Focal fatty deposition noted within the medial segment of left hepatic lobe adjacent to falciform ligament, image 22/2. Gallbladder sludge. No gallstones, gallbladder wall inflammation or bile duct dilatation. Pancreas: Unremarkable. No  pancreatic ductal dilatation or surrounding inflammatory changes. Spleen: Normal in size without focal abnormality. Adrenals/Urinary Tract: Normal adrenal glands. Kidneys are unremarkable. No kidney stone, mass or hydronephrosis identified bilaterally. No hydroureter or signs of ureteral lithiasis. Mild circumferential wall thickening is noted involving the bladder. No focal bladder abnormality. Stomach/Bowel: Moderate hiatal hernia. The appendix is visualized and appears normal. No bowel wall thickening, inflammation, or distension. Distal colonic diverticulosis without acute inflammation. Vascular/Lymphatic: Aortic atherosclerosis. No aneurysm. No abdominopelvic adenopathy. Reproductive: Status post hysterectomy. No adnexal masses. Other: No free fluid or fluid collection. Musculoskeletal: Scoliosis and degenerative disc disease. No acute or suspicious osseous findings. Previous ORIF of the right femur. IMPRESSION: 1. No acute findings within the abdomen or pelvis. No signs of obstructive uropathy. 2. Mild circumferential wall thickening is noted involving the bladder. Correlate for any clinical signs or symptoms of cystitis. 3. Hiatal hernia. 4. Aortic atherosclerosis. Aortic Atherosclerosis (ICD10-I70.0). Electronically Signed   By: Kerby Moors M.D.   On: 06/14/2020 18:28     Medications   Scheduled Meds: . amiodarone  100 mg Oral Daily  . cholecalciferol  1,000 Units Oral Daily  . donepezil  10 mg Oral Daily  . enoxaparin (LOVENOX) injection  30 mg Subcutaneous Q24H  . ferrous sulfate  325 mg Oral Q breakfast  . gabapentin  600 mg Oral Daily  . hydrOXYzine  25 mg Oral BID  . insulin aspart  0-5 Units Subcutaneous QHS  . insulin aspart  0-9 Units Subcutaneous TID WC  . levothyroxine  125 mcg Oral Q0600  . linaclotide  72 mcg Oral QAC breakfast  . loratadine  10 mg Oral Daily  . memantine  5 mg Oral QHS  . montelukast  10 mg Oral QHS  . multivitamin with minerals  1 tablet Oral Daily  .  pantoprazole  40 mg Oral Daily  . potassium chloride  40 mEq Oral Q4H  . sertraline  100 mg Oral BID  . traZODone  100 mg Oral QHS  . vitamin B-12  1,000 mcg Oral Daily  . vitamin E  400 Units Oral Daily   Continuous Infusions: . sodium chloride Stopped (06/14/20 1756)  . cefTRIAXone (ROCEPHIN)  IV Stopped (06/16/20 1332)  . lactated ringers 75 mL/hr at 06/16/20 1503       LOS: 1 day    Time spent: 25 minutes    Ezekiel Slocumb, DO [facility]  06/16/2020, 4:11 PM      If 7PM-7AM, please contact night-coverage. How to contact the [facility] Attending or Consulting provider [place] or covering provider during after hours [place], for this patient?    1. Check the care team in [facility] and look for a) attending/consulting TRH provider listed and b) the [facility] team listed 2. Log into [email or web address] and use [place]'s universal password to access. If you do not have the password, please contact the hospital operator. 3. Locate the [facility] provider you are looking for under [facility] and page to a number that you can be directly reached. 4. If you still have difficulty reaching the provider, please page the [facility] (Director on Call) for the Hospitalists listed on amion for assistance.

## 2020-06-16 NOTE — NC FL2 (Signed)
[place] LEVEL OF CARE SCREENING TOOL     IDENTIFICATION  Patient Name: Kristi [name] Birthdate: 19-Jan-1941 Sex: female Admission Date (Current Location): 06/14/2020  [place] and [place] Number:  Engineering geologist and Address:  [facility], [address], [place], [place] [address]      Provider Number: [id number]  Attending Physician Name and Address:  Ezekiel Slocumb, DO  Relative Name and Phone Number:       Current Level of Care: Hospital Recommended Level of Care: [place] Prior Approval Number:    Date Approved/Denied:   PASRR Number: [id number]  Discharge Plan: SNF    Current Diagnoses: Patient Active Problem List   Diagnosis Date Noted  . UTI (urinary tract infection) 06/14/2020  . Type II diabetes mellitus with renal manifestations ([place]) 06/14/2020  . Atrial fibrillation, chronic ([place]) 06/14/2020  . Acute renal 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[place]) 06/14/2020  . Lactic acidosis 06/14/2020  . Diarrhea   . Myoclonus   . Weakness   . AKI (acute kidney injury) ([place]) 05/16/2020  . Transaminitis 05/16/2020  . GI bleed 01/28/2020  . Rectal bleed 01/28/2020  . At risk for polypharmacy 01/28/2020  . Acute delirium   . HCAP (healthcare-associated pneumonia)   . Hypotension   . Hyponatremia   . Lobar pneumonia ([place]) 10/23/2019  . Atrial flutter ([place])   . Dizziness   . Atrial fibrillation status post cardioversion ([place]) 09/27/2019  . CHF (congestive heart failure) ([place]) 09/26/2019  . Chronic diastolic CHF (congestive heart failure) ([place]) 09/25/2019  . Hypertensive urgency 09/25/2019  . Fluid overload 09/25/2019  . Excessive gas 05/14/2019  . Generalized bloating 05/14/2019  . Bright red blood per rectum 05/14/2019  . Major depressive disorder, recurrent severe without psychotic features ([place]) 12/01/2018  . Chest pain 11/20/2018  . Difficulty walking 11/19/2018  . Insomnia  11/15/2018  . Type 2 diabetes mellitus with hypoglycemia without coma, with long-term current use of insulin ([place]) 11/15/2018  . Goals of care, counseling/discussion 02/06/2018  . 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 Rectal mass 01/05/2018  . Leg pain, bilateral 12/06/2017  . Lymphedema 07/10/2017  . Atherosclerosis of native arteries of the extremities with ulceration ([place]) 07/10/2017  . Stricture and stenosis of esophagus   . Dysphagia   . Syncope and collapse 04/11/2017  . Mild dementia ([place]) 01/24/2017  . Lumbar facet osteoarthritis (Bilateral) 01/17/2017  . Acute postoperative pain 01/17/2017  . Hypokalemia 12/24/2016  . Anxiety 12/07/2016  . 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 Cerebrovascular accident ([place]) 12/07/2016  . Chronic depression 12/07/2016  . Essential hypertension 12/07/2016  . Hypothyroidism 12/07/2016  . Mixed hyperlipidemia 12/07/2016  . Myocardial infarction ([place]) 12/07/2016  . TIA (transient ischemic attack) 11/17/2016  . Parkinson disease ([place]) 10/27/2016  . Tremor 10/27/2016  . Chronic knee pain (B) (R>L) 10/19/2016  . Ischemic chest pain ([place]) 09/29/2016  . Abnormal CT scan, lumbar spine 09/08/2016  . Lumbar foraminal stenosis (multilevel) 09/08/2016  . Fracture of clavicle 08/03/2016  . Polyneuropathy 05/17/2016  . Neurogenic pain 04/12/2016  . Chronic lower extremity pain (Bilateral) (R>L) 04/12/2016  . Chronic lower extremity radicular pain (Primary Source of Pain) (Bilateral) (R>L) 04/12/2016  . Lower extremity weakness 04/12/2016  . Chronic low back pain (Secondary source of pain) (Bilateral) (R>L) 04/12/2016  . Chronic wrist pain (Left) (secondary to fracture) 04/12/2016  . Lumbar spondylosis 04/12/2016  . Chronic pain syndrome 04/11/2016  . Long  term current use of opiate analgesic 04/11/2016  . Long term prescription opiate use 04/11/2016  . Opiate use 04/11/2016  . Long term prescription benzodiazepine use 04/11/2016  . Lumbar  radiculopathy (Multilevel) (Bilateral) 05/07/2015  . Lumbar facet syndrome (Bilateral) (R>L) 08/21/2014  . 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 Sacroiliac joint dysfunction (Bilateral) 08/13/2014  . Frequent falls 06/04/2014  . Memory loss 06/04/2014  . Anxiety and depression 08/22/2013  . CAD (coronary artery disease) 08/22/2013  . Therapeutic opioid-induced constipation (OIC) 08/22/2013  . GERD (gastroesophageal reflux disease) 08/22/2013    Orientation RESPIRATION BLADDER Height & Weight     Self,Time,Situation,Place  Normal Incontinent,External catheter Weight: 195 lb 8.8 oz (88.7 kg) Height:  5\' 6"  (167.6 cm)  BEHAVIORAL SYMPTOMS/MOOD NEUROLOGICAL BOWEL NUTRITION STATUS   (None)  (None) Incontinent Diet (Heart healthy/carb modified)  AMBULATORY STATUS COMMUNICATION OF NEEDS Skin   Limited Assist Verbally Normal                       Personal Care Assistance Level of Assistance  Bathing,Feeding,Dressing Bathing Assistance: Limited assistance Feeding assistance: Limited assistance Dressing Assistance: Limited assistance     Functional Limitations Info  Sight,Hearing,Speech Sight Info: Adequate Hearing Info: Adequate Speech Info: Adequate    SPECIAL CARE FACTORS FREQUENCY  PT (By licensed PT),OT (By licensed OT)     PT Frequency: 5 x week OT Frequency: 5 x week            Contractures Contractures Info: Not present    Additional Factors Info  Code Status,Allergies,Psychotropic Code Status Info: Full code Allergies Info: Penicillins Psychotropic Info: Anxiety, Depression         Current Medications (06/16/2020):  This is the current hospital active medication list Current Facility-Administered Medications  Medication Dose Route Frequency Provider Last Rate Last Admin  . 0.9 %  sodium chloride infusion   Intravenous PRN Ivor Costa, MD   Stopped at 06/14/20 1756  . acetaminophen (TYLENOL) tablet 650 mg  650 mg Oral Q6H  PRN Ivor Costa, MD   650 mg at 06/15/20 1135  . albuterol (VENTOLIN HFA) 108 (90 Base) MCG/ACT inhaler 2 puff  2 puff Inhalation Q4H PRN Ivor Costa, MD      . amiodarone (PACERONE) tablet 100 mg  100 mg Oral Daily Ivor Costa, MD   100 mg at 06/16/20 0846  . cefTRIAXone (ROCEPHIN) 2 g in sodium chloride 0.9 % 100 mL IVPB  2 g Intravenous Q24H Ivor Costa, MD   Stopped at 06/15/20 1522  . cholecalciferol (VITAMIN D3) tablet 1,000 Units  1,000 Units Oral Daily Ivor Costa, MD   1,000 Units at 06/16/20 [phone]  . enoxaparin (LOVENOX) injection 30 mg  30 mg Subcutaneous Q24H Nicole Kindred A, DO   30 mg at 06/15/20 1829  . ferrous sulfate tablet 325 mg  325 mg Oral Q breakfast Ivor Costa, MD   325 mg at 06/16/20 0842  . gabapentin (NEURONTIN) tablet 600 mg  600 mg Oral Daily Ivor Costa, MD   600 mg at 06/16/20 0843  . HYDROcodone-acetaminophen (NORCO/VICODIN) 5-325 MG per tablet 1 tablet  1 tablet Oral Q6H PRN Ivor Costa, MD      . hydrocortisone (ANUSOL-HC) suppository 25 mg  25 mg Rectal BID PRN Ivor Costa, MD      . hydrOXYzine (ATARAX/VISTARIL) tablet 25 mg  25 mg Oral BID Ivor Costa, MD   25 mg at 06/16/20 0843  . insulin aspart (novoLOG)  injection 0-5 Units  0-5 Units Subcutaneous QHS Ivor Costa, MD   2 Units at 06/15/20 1428  . insulin aspart (novoLOG) injection 0-9 Units  0-9 Units Subcutaneous TID WC Ivor Costa, MD   2 Units at 06/16/20 [phone]  . lactated ringers infusion   Intravenous Continuous Ezekiel Slocumb, DO 75 mL/hr at 06/16/20 6962 Rate Change at 06/16/20 0807  . levothyroxine (SYNTHROID) tablet 125 mcg  125 mcg Oral Q0600 Ivor Costa, MD   125 mcg at 06/16/20 0535  . linaclotide (LINZESS) capsule 72 mcg  72 mcg Oral QAC breakfast Ivor Costa, MD   72 mcg at 06/16/20 0841  . loratadine (CLARITIN) tablet 10 mg  10 mg Oral Daily Ivor Costa, MD   10 mg at 06/16/20 0843  . meclizine (ANTIVERT) tablet 12.5 mg  12.5 mg Oral BID PRN Ivor Costa, MD      . memantine [facility]) tablet 5 mg  5 mg Oral  QHS Ivor Costa, MD   5 mg at 06/15/20 2248  . montelukast (SINGULAIR) tablet 10 mg  10 mg Oral QHS Ivor Costa, MD   10 mg at 06/15/20 2248  . multivitamin with minerals tablet 1 tablet  1 tablet Oral Daily Ivor Costa, MD   1 tablet at 06/16/20 [phone]  . nitroGLYCERIN (NITROSTAT) SL tablet 0.4 mg  0.4 mg Sublingual Q5 min PRN Ivor Costa, MD      . ondansetron [facility]) injection 4 mg  4 mg Intravenous Q8H PRN Ivor Costa, MD      . pantoprazole (PROTONIX) EC tablet 40 mg  40 mg Oral Daily Ivor Costa, MD   40 mg at 06/16/20 0844  . polyethylene glycol (MIRALAX / GLYCOLAX) packet 17 g  17 g Oral Daily PRN Ivor Costa, MD   17 g at 06/15/20 1136  . potassium chloride SA (KLOR-CON) CR tablet 40 mEq  40 mEq Oral Q4H Nicole Kindred A, DO   40 mEq at 06/16/20 0844  . sertraline (ZOLOFT) tablet 100 mg  100 mg Oral BID Ivor Costa, MD   100 mg at 06/16/20 0844  . traZODone (DESYREL) tablet 100 mg  100 mg Oral QHS Ivor Costa, MD   100 mg at 06/15/20 2248  . vitamin B-12 (CYANOCOBALAMIN) tablet 1,000 mcg  1,000 mcg Oral Daily Ivor Costa, MD   1,000 mcg at 06/16/20 0845  . vitamin E capsule 400 Units  400 Units Oral Daily Ivor Costa, MD   400 Units at 06/16/20 0845     Discharge Medications: Please see discharge summary for a list of discharge medications.  Relevant Imaging Results:  Relevant Lab Results:   Additional Information SS#: [id number]. Went to [employer] [employer] 05/21/20. Discharged home just a few days ago.  Candie Chroman, LCSW

## 2020-06-16 NOTE — TOC Initial Note (Addendum)
Transition of Care [facility]) - Initial/Assessment Note    Patient Details  Name: Kristi [name] MRN: [id number] Date of Birth: 03-Mar-1941  Transition of Care [facility]) CM/SW Contact:    Candie Chroman, LCSW Phone Number: 06/16/2020, 10:22 AM  Clinical Narrative:  Readmission prevention screen complete. CSW met with patient. No supports at bedside. CSW introduced role and explained that discharge planning would be discussed. Patient's PCP at [facility] in [place] recently retired and she has not been set up with another provider yet. She uses the bus to get to appointments. Pharmacy is [facility]. No issues obtaining medications. Patient did not have home health prior to admission. She has a walker, wheelchair, hospital bed, and bedside commode at home. Patient was discharged to [facility] on 3/3 and discharged home just a few days ago. PT is recommending SNF again and patient is agreeable. She said her daughter [name] was going to check and see if [facility] could take her back. CSW spoke with [facility] admissions coordinator who stated patient is in her copay days and told daughter yesterday that she would have to pay 7 days of copays each week because her Medicaid will not cover the copays. She has not received an update from daughter yet. Will follow up. No further concerns. CSW encouraged patient to contact CSW as needed. CSW will continue to follow patient for support and facilitate discharge to SNF once medically stable.      1:28 pm: Called to update daughter. She asked that [facility] review referral. Admissions coordinator will review.     Expected Discharge Plan: Skilled Nursing Facility Barriers to Discharge: Continued Medical Work up   Patient Goals and CMS Choice        Expected Discharge Plan and Services Expected Discharge Plan: [place] Acute Care Choice: [place] arrangements for the past 2 months: [place]                                      Prior Living Arrangements/Services Living arrangements for the past 2 months: [place] Lives with:: Self Patient language and need for interpreter reviewed:: Yes Do you feel safe going back to the place where you live?: Yes      Need for Family Participation in Patient Care: Yes (Comment) Care giver support system in place?: Yes (comment) Current home services: DME Criminal Activity/Legal Involvement Pertinent to Current Situation/Hospitalization: No - Comment as needed  Activities of Daily Living Home Assistive Devices/Equipment: Cane (specify quad or straight),Walker (specify type) ADL Screening (condition at time of admission) Patient's cognitive ability adequate to safely complete daily activities?: Yes Is the patient deaf or have difficulty hearing?: No Does the patient have difficulty seeing, even when wearing glasses/contacts?: No Does the patient have difficulty concentrating, remembering, or making decisions?: No Patient able to express need for assistance with ADLs?: Yes Does the patient have difficulty dressing or bathing?: Yes Independently performs ADLs?: No Communication: Independent Dressing (OT): Needs assistance Is this a change from baseline?: Pre-admission baseline Grooming: Needs assistance Is this a change from baseline?: Pre-admission baseline Feeding: Independent Bathing: Needs assistance Is this a change from baseline?: Pre-admission baseline Toileting: Needs assistance Is this a change from baseline?: Pre-admission baseline In/Out Bed: Needs assistance Is this a change from baseline?: Pre-admission baseline Walks in Home: Needs assistance Is  this a change from baseline?: Pre-admission baseline Does the patient have difficulty walking or climbing stairs?: Yes Weakness of Legs: Both Weakness of Arms/Hands: None  Permission  Sought/Granted Permission sought to share information with : Facility Art therapist granted to share information with : Yes, Verbal Permission Granted     Permission granted to share info w AGENCY: SNF's        Emotional Assessment Appearance:: Appears stated age Attitude/Demeanor/Rapport: Engaged,Gracious Affect (typically observed): Accepting,Appropriate,Calm,Pleasant Orientation: : Oriented to Self,Oriented to Place,Oriented to  Time,Oriented to Situation Alcohol / Substance Use: Not Applicable Psych Involvement: No (comment)  Admission diagnosis:  UTI (urinary tract infection) [N39.0] Patient Active Problem List   Diagnosis Date Noted  . UTI (urinary tract infection) 06/14/2020  . Type II diabetes mellitus with renal manifestations ([place]) 06/14/2020  . Atrial fibrillation, chronic ([place]) 06/14/2020  . Acute renal 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[place]) 06/14/2020  . Lactic acidosis 06/14/2020  . Diarrhea   . Myoclonus   . Weakness   . AKI (acute kidney injury) ([place]) 05/16/2020  . Transaminitis 05/16/2020  . GI bleed 01/28/2020  . Rectal bleed 01/28/2020  . At risk for polypharmacy 01/28/2020  . Acute delirium   . HCAP (healthcare-associated pneumonia)   . Hypotension   . Hyponatremia   . Lobar pneumonia ([place]) 10/23/2019  . Atrial flutter ([place])   . Dizziness   . Atrial fibrillation status post cardioversion ([place]) 09/27/2019  . CHF (congestive heart failure) ([place]) 09/26/2019  . Chronic diastolic CHF (congestive heart failure) ([place]) 09/25/2019  . Hypertensive urgency 09/25/2019  . Fluid overload 09/25/2019  . Excessive gas 05/14/2019  . Generalized bloating 05/14/2019  . Bright red blood per rectum 05/14/2019  . Major depressive disorder, recurrent severe without psychotic features ([place]) 12/01/2018  . Chest pain 11/20/2018  . Difficulty walking 11/19/2018  . Insomnia 11/15/2018  . Type 2 diabetes mellitus with hypoglycemia  without coma, with long-term current use of insulin ([place]) 11/15/2018  . Goals of care, counseling/discussion 02/06/2018  . Cancer of anal canal ([place]) 01/25/2018  . Rectal mass 01/05/2018  . Leg pain, bilateral 12/06/2017  . Lymphedema 07/10/2017  . Atherosclerosis of native arteries of the extremities with ulceration ([place]) 07/10/2017  . Stricture and stenosis of esophagus   . Dysphagia   . Syncope and collapse 04/11/2017  . Mild dementia ([place]) 01/24/2017  . Lumbar facet osteoarthritis (Bilateral) 01/17/2017  . Acute postoperative pain 01/17/2017  . Hypokalemia 12/24/2016  . Anxiety 12/07/2016  . 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 Cerebrovascular accident ([place]) 12/07/2016  . Chronic depression 12/07/2016  . Essential hypertension 12/07/2016  . Hypothyroidism 12/07/2016  . Mixed hyperlipidemia 12/07/2016  . Myocardial infarction ([place]) 12/07/2016  . TIA (transient ischemic attack) 11/17/2016  . Parkinson disease ([place]) 10/27/2016  . Tremor 10/27/2016  . Chronic knee pain (B) (R>L) 10/19/2016  . Ischemic chest pain ([place]) 09/29/2016  . Abnormal CT scan, lumbar spine 09/08/2016  . Lumbar foraminal stenosis (multilevel) 09/08/2016  . Fracture of clavicle 08/03/2016  . Polyneuropathy 05/17/2016  . Neurogenic pain 04/12/2016  . Chronic lower extremity pain (Bilateral) (R>L) 04/12/2016  . Chronic lower extremity radicular pain (Primary Source of Pain) (Bilateral) (R>L) 04/12/2016  . Lower extremity weakness 04/12/2016  . Chronic low back pain (Secondary source of pain) (Bilateral) (R>L) 04/12/2016  . Chronic wrist pain (Left) (secondary to fracture) 04/12/2016  . Lumbar spondylosis 04/12/2016  . Chronic pain syndrome 04/11/2016  . Long term current use of opiate  analgesic 04/11/2016  . Long term prescription opiate use 04/11/2016  . Opiate use 04/11/2016  . Long term prescription benzodiazepine use 04/11/2016  . Lumbar radiculopathy (Multilevel) (Bilateral) 05/07/2015  . Lumbar  facet syndrome (Bilateral) (R>L) 08/21/2014  . 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 Sacroiliac joint dysfunction (Bilateral) 08/13/2014  . Frequent falls 06/04/2014  . Memory loss 06/04/2014  . Anxiety and depression 08/22/2013  . CAD (coronary artery disease) 08/22/2013  . Therapeutic opioid-induced constipation (OIC) 08/22/2013  . GERD (gastroesophageal reflux disease) 08/22/2013   PCP:  [place], [place] Pharmacy:   [facility] [address] [place] [address] Phone: [phone] Fax: [phone]  [place], [place] - [address] [place] [address] Phone: [phone] Fax: [phone]  [place], [place] - [address] [place] [address] Phone: [phone] Fax: [phone]     Social Determinants of Health (SDOH) Interventions    Readmission Risk Interventions Readmission Risk Prevention Plan 06/16/2020 05/16/2020 01/30/2020  Transportation Screening Complete Complete Complete  Medication Review Press photographer) Complete Complete Complete  PCP or Specialist appointment within 3-5 days of discharge Complete Complete Complete  HRI or Home Care Consult - Complete Complete  SW Recovery Care/Counseling Consult Complete Complete Complete  Palliative Care Screening Not Applicable Not Applicable Not Applicable  Skilled Nursing Facility Complete Complete Not Applicable  Some recent data might be hidden

## 2020-06-16 NOTE — Evaluation (Signed)
Occupational Therapy Evaluation Patient Details Name: Kristi [name] MRN: [id number] DOB: Sep 27, 1940 Today's Date: 06/16/2020    History of Present Illness Pt is an 80 y.o. female with medical history significant of hypertension, hyperlipidemia, diabetes mellitus, stroke, GERD, hypothyroidism, depression with anxiety, pacemaker placement, Parkinson's disease, CAD, myocardial infarction, uterine cancer, dCHF, atrial fibrillation not on anticoagulants, chronic pain syndrome, GI bleeding, dementia, iron deficiency anemia, who presents with generalized weakness, dysuria and burning on urination. Recent admission ~32mo ago.   Clinical Impression   Pt was seen for OT evaluation this date. Prior to hospital admission, pt was only home for 2 days from short term rehab following previous hospital admission. Prior to recent admissions, pt was generally independent with basic ADL tasks, receiving assist from daughter for IADL. Pt was supposed to start having an aide come to assist but was unable to start services prior to readmission. Currently pt demonstrates impairments as described below (See OT problem list) which functionally limit her ability to perform ADL/self-care tasks. Pt currently requires MIN-MOD A for bed mobility, intermittent VC and tactile cues vs MIN A for static sitting balance EOB, and MAX A for LB ADL tasks. Ultimately unable to clear buttocks from EOB with ADL transfer training attempts 2/2 bilateral knee pain. RN notified. Pt endorsed dizziness with positional changes. BP supine and sitting noted below, RN notified. Pt would benefit from skilled OT services to address noted impairments and functional limitations (see below for any additional details) in order to maximize safety and independence while minimizing falls risk and caregiver burden. Upon hospital discharge, recommend Hanley Hays, MPH, MS, OTR/L ascom [phone] 06/16/20, 1:22 PM STR to maximize pt safety and return to PLOF.       Follow Up Recommendations  SNF    Equipment Recommendations  None recommended by OT    Recommendations for Other Services       Precautions / Restrictions Precautions Precautions: Fall Precaution Comments: monitor BP Restrictions Weight Bearing Restrictions: No      Mobility Bed Mobility Overal bed mobility: Needs Assistance Bed Mobility: Supine to Sit;Sit to Supine     Supine to sit: Min assist;HOB elevated;Mod assist Sit to supine: Min assist;Mod assist        Transfers Overall transfer level: Needs assistance Equipment used: Rolling walker (2 wheeled) Transfers: Sit to/from Stand Sit to Stand: Total assist;From elevated surface         General transfer comment: Ultimately unable upon 2 attempts with bed elevated 2/2 significant bilateral knee pain    Balance Overall balance assessment: Needs assistance Sitting-balance support: Feet supported;Single extremity supported Sitting balance-Leahy Scale: Poor Sitting balance - Comments: BUE support, occasional posterior lean noted with CGA-minA to correct Postural control: Posterior lean   Standing balance-Leahy Scale: Zero Standing balance comment: unable to perform                           ADL either performed or assessed with clinical judgement   ADL Overall ADL's : Needs assistance/impaired                                       General ADL Comments: Pt requires MAX A for LB ADL tasks, MAX+ for ADL transfer attempts EOB, set up and PRN CGA for sitting balance during seated ADL tasks     Vision Patient Visual Report: No change from baseline  Perception     Praxis      Pertinent Vitals/Pain Pain Assessment: 0-10 Pain Score: 10-Worst pain ever Pain Location: bilat knees with standing attempts Pain Descriptors / Indicators: Aching Pain Intervention(s): Limited activity within patient's tolerance;Monitored during session;Repositioned;Patient requesting pain meds-RN  notified     Hand Dominance Right   Extremity/Trunk Assessment Upper Extremity Assessment Upper Extremity Assessment: Generalized weakness   Lower Extremity Assessment Lower Extremity Assessment: Generalized weakness       Communication Communication Communication: HOH   Cognition Arousal/Alertness: Awake/alert Behavior During Therapy: WFL for tasks assessed/performed Overall Cognitive Status: Within Functional Limits for tasks assessed                                     General Comments  supine BP 103/54 HR 61; EOB pt endorses dizziness BP 105/53 HR 62, RN notified    Exercises Other Exercises Other Exercises: ADL transfer training, cues for hand/foot placement, ultimately unable to clear buttocks from bed 2/2 knee pain Other Exercises: Pt instructed in falls prevention, positional changes impacting dizziness   Shoulder Instructions      Home Living Family/patient expects to be discharged to:: Private residence Living Arrangements: Alone Available Help at Discharge: Family;Available PRN/intermittently;Other (Comment);Personal care attendant (daughter works, unable to assist full time. Aide to start to assist 3-4 hrs a day durign the week but did not have a chance to start) Type of Home: Mobile home Home Access: Ramped entrance     Home Layout: One level     Bathroom Shower/Tub: Teacher, early years/pre: Handicapped height Bathroom Accessibility: Yes   Home Equipment: Environmental consultant - 4 wheels;Toilet riser;Wheelchair - manual;Hospital bed          Prior Functioning/Environment Level of Independence: Needs assistance  Gait / Transfers Assistance Needed: RW used for household distances, does nto tolerate community distances, prior to recent rehab stay after past hospitalization ADL's / Homemaking Assistance Needed: DTR helps with grocereis and meals, prior to recent rehab stay after past hospitalization   Comments: Pt with recent stay at rehab,  was home for 2days only        OT Problem List: Decreased strength;Pain;Decreased activity tolerance;Decreased safety awareness;Impaired balance (sitting and/or standing);Decreased knowledge of use of DME or AE;Obesity      OT Treatment/Interventions: Self-care/ADL training;Therapeutic exercise;Therapeutic activities;DME and/or AE instruction;Patient/family education;Balance training    OT Goals(Current goals can be found in the care plan section) Acute Rehab OT Goals Patient Stated Goal: to go home OT Goal Formulation: With patient Time For Goal Achievement: 06/30/20 Potential to Achieve Goals: Good ADL Goals Pt Will Perform Lower Body Dressing: with mod assist;sit to/from stand Pt Will Transfer to Toilet: with min assist;stand pivot transfer;bedside commode (LRAD) Additional ADL Goal #1: Pt will perform ADL tasks seated EOB with fair sitting balance and no VC to support positioning and maintain balance.  OT Frequency: Min 1X/week   Barriers to D/C:            Co-evaluation              AM-PAC OT "6 Clicks" Daily Activity     Outcome Measure Help from another person eating meals?: None Help from another person taking care of personal grooming?: A Little Help from another person toileting, which includes using toliet, bedpan, or urinal?: Total Help from another person bathing (including washing, rinsing, drying)?: A Lot Help from another person to  put on and taking off regular upper body clothing?: A Little Help from another person to put on and taking off regular lower body clothing?: A Lot 6 Click Score: 15   End of Session Nurse Communication: Patient requests pain meds;Other (comment) (RN pain meds, write armband tight on R wrist; CNA assist for cleaning up and new PureWick)  Activity Tolerance: Patient limited by pain Patient left: in bed;with call bell/phone within reach;with bed alarm set  OT Visit Diagnosis: Other abnormalities of gait and mobility  (R26.89);Muscle weakness (generalized) (M62.81);History of falling (Z91.81);Pain Pain - Right/Left: Right (both) Pain - part of body: Knee                Time: 9166-0600 OT Time Calculation (min): 25 min Charges:  OT General Charges $OT Visit: 1 Visit OT Evaluation $OT Eval High Complexity: 1 High OT Treatments $Self Care/Home Management : 8-22 mins  Hanley Hays, MPH, MS, OTR/L ascom [phone] 06/16/20, 1:22 PM

## 2020-06-17 DIAGNOSIS — R531 Weakness: Secondary | ICD-10-CM

## 2020-06-17 DIAGNOSIS — N390 Urinary tract infection, site not specified: Principal | ICD-10-CM

## 2020-06-17 DIAGNOSIS — E1122 Type 2 diabetes mellitus with diabetic chronic kidney disease: Secondary | ICD-10-CM

## 2020-06-17 LAB — GLUCOSE, CAPILLARY
Glucose-Capillary: 159 mg/dL — ABNORMAL HIGH (ref 70–99)
Glucose-Capillary: 154 mg/dL — ABNORMAL HIGH (ref 70–99)
Glucose-Capillary: 88 mg/dL (ref 70–99)
Glucose-Capillary: 140 mg/dL — ABNORMAL HIGH (ref 70–99)

## 2020-06-17 LAB — BASIC METABOLIC PANEL
Anion gap: 6 (ref 5–15)
BUN: 19 mg/dL (ref 8–23)
CO2: 34 mmol/L — ABNORMAL HIGH (ref 22–32)
Calcium: 9.2 mg/dL (ref 8.9–10.3)
Chloride: 101 mmol/L (ref 98–111)
Creatinine, Ser: 0.78 mg/dL (ref 0.44–1.00)
GFR, Estimated: >60 mL/min (ref >60)
Glucose, Bld: 151 mg/dL — ABNORMAL HIGH (ref 70–99)
Potassium: 3.7 mmol/L (ref 3.5–5.1)
Sodium: 141 mmol/L (ref 135–145)

## 2020-06-17 LAB — MAGNESIUM: Magnesium: 1.8 mg/dL (ref 1.7–2.4)

## 2020-06-17 MED ORDER — APIXABAN 5 MG PO TABS
5.0000 mg | ORAL_TABLET | Freq: Two times a day (BID) | ORAL | Status: DC
  Administered 2020-06-18 – 2020-06-19 (×2): 5 mg via ORAL
  Filled 2020-06-17 (×2): qty 1

## 2020-06-17 MED ORDER — POTASSIUM CHLORIDE CRYS ER 20 MEQ PO TBCR
20.0000 meq | EXTENDED_RELEASE_TABLET | Freq: Two times a day (BID) | ORAL | Status: AC
  Administered 2020-06-17 (×2): 20 meq via ORAL
  Filled 2020-06-17 (×3): qty 1

## 2020-06-17 NOTE — Progress Notes (Signed)
PROGRESS NOTE    Karrine Kluttz  SEG:[id number] DOB: 1940-08-08 DOA: 06/14/2020 PCP: [facility]   Brief Narrative: Richard Holz is a 80 y.o. female with history of hypertension, hyperlipidemia, diabetes mellitus, stroke, GERD, hypothyroidism, depression, anxiety, status post pacemaker placement, Parkinson disease, CAD/CABG, uterine cancer, diastolic heart failure, age fibrillation, chronic pain, recent GI bleed.  Patient presented secondary to generalized weakness, dysuria in setting of UTI.  Patient was started on empiric antibiotics with ceftriaxone IV.  Urine cultures positive for E. coli urinary infection.   Assessment & Plan:   Principal Problem:   UTI (urinary tract infection) Active Problems:   Anxiety and depression   CAD (coronary artery disease)   GERD (gastroesophageal reflux disease)   Essential hypertension   Hypothyroidism   Hypokalemia   Chronic diastolic CHF (congestive heart failure) (HCC)   Type II diabetes mellitus with renal manifestations (HCC)   Atrial fibrillation, chronic (HCC)   Acute renal 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HCC)   Lactic acidosis   UTI Started empirically on Ceftriaxone IV. Urine culture significant for E. Coli which is resistant to ampicillin. Blood culture with no growth. -Continue Ceftriaxone IV x5 days  Hypokalemia Resolved with repletion.  Lactic acidosis Resolved with IV fluids.  AKI on CKD stage IIIb Baseline creatinine of about 0.8. Creatinine of 2.07 on admission in setting of dehydration. Resolved with IV fluids.  Diabetes mellitus type 2 Patient is on Medley as an outpatient. Started on SSI while inpatient. -Continue SSI  Peripheral neuropathy -Continue gabapentin  Paroxysmal atrial fibrillation Patient is on amiodarone 100 mg daily. Metoprolol tartrate 12.5 mg daily listed as home medication, however per recent discharge summary, she was discharged on metoprolol succinate.  Patient was previously on Eliquis which was discontinued on discharge from last hospitalization with recommendation to restart in one week. -Continue amiodarone -Continue to hold metoprolol succinate 12.5 mg daily secondary to borderline heart rate -Restart Eliquis 5 mg BID (no dose adjustment required)  CAD/CABG Noted. Follows with cardiology as an outpatient. Has nitroglycerin as needed as an outpatient.  Chronic diastolic heart failure Patient is on torsemide as an outpatient which was held on admission secondary to AKI. Currently appears to be euvolemic. Weight has trended up -Discontinue IV fluids -Daily weights  Hypothyroidism -Continue Synthroid 125 mcg daily  Dementia Depression -Continue Namenda and Aricept -Continue Zoloft   DVT prophylaxis: Lovenox. Restart Eliquis tomorrow. Code Status:   Code Status: Full Code Family Communication: None at bedside Disposition Plan: Discharge to SNF once bed is available. Medically stable for discharge.   Consultants:   None  Procedures:   None  Antimicrobials:  Ceftriaxone IV    Subjective: No issues overnight. No chest pain.  Objective: Vitals:   06/17/20 0500 06/17/20 0851 06/17/20 1112 06/17/20 1531  BP:  (!) 112/51 113/60 120/74  Pulse:  (!) 59 62 65  Resp:      Temp:  97.9 F (36.6 C) 97.8 F (36.6 C) (!) 97.5 F (36.4 C)  TempSrc:  Oral  Oral  SpO2:  95% 93% 94%  Weight: 88.2 kg     Height:        Intake/Output Summary (Last 24 hours) at 06/17/2020 1600 Last data filed at 06/17/2020 1420 Gross per 24 hour  Intake 360 ml  Output 1150 ml  Net -790 ml   Filed Weights   06/15/20 0419 06/16/20 0500 06/17/20 0500  Weight: 86.5 kg 88.7 kg 88.2 kg    Examination:  General exam: Appears calm and comfortable Respiratory system: Clear to auscultation. Respiratory effort normal. Cardiovascular system: S1 & S2 heard, Irregular rhythm. Normal rate. Gastrointestinal system: Abdomen is nondistended, soft  and nontender. No organomegaly or masses felt. Normal bowel sounds heard. Central nervous system: Alert and oriented. No focal neurological deficits. Musculoskeletal: No edema. No calf tenderness Skin: No cyanosis. No rashes Psychiatry: Judgement and insight appear normal. Mood & affect appropriate.     Data Reviewed: I have personally reviewed following labs and imaging studies  CBC Lab Results  Component Value Date   WBC 5.3 06/16/2020   RBC 3.14 (L) 06/16/2020   HGB 9.6 (L) 06/16/2020   HCT 29.1 (L) 06/16/2020   MCV 92.7 06/16/2020   MCH 30.6 06/16/2020   PLT 126 (L) 06/16/2020   MCHC 33.0 06/16/2020   RDW 16.1 (H) 06/16/2020   LYMPHSABS 1.4 06/14/2020   MONOABS 0.5 06/14/2020   EOSABS 0.1 06/14/2020   BASOSABS 0.0 71/08/2692     Last metabolic panel Lab Results  Component Value Date   NA 141 06/17/2020   K 3.7 06/17/2020   CL 101 06/17/2020   CO2 34 (H) 06/17/2020   BUN 19 06/17/2020   CREATININE 0.78 06/17/2020   GLUCOSE 151 (H) 06/17/2020   GFRNONAA >60 06/17/2020   GFRAA 58 (L) 10/28/2019   CALCIUM 9.2 06/17/2020   PROT 6.9 06/14/2020   ALBUMIN 3.4 (L) 06/14/2020   BILITOT 0.5 06/14/2020   ALKPHOS 79 06/14/2020   AST 32 06/14/2020   ALT 33 06/14/2020   ANIONGAP 6 06/17/2020    CBG (last 3)  Recent Labs    06/16/20 2154 06/17/20 0727 06/17/20 1111  GLUCAP 149* 159* 154*     GFR: Estimated Creatinine Clearance: 61.7 mL/min (by C-G formula based on SCr of 0.78 mg/dL).  Coagulation Profile: Recent Labs  Lab 06/14/20 0952  INR 1.2    Recent Results (from the past 240 hour(s))  Blood culture (routine single)     Status: None (Preliminary result)   Collection Time: 06/14/20  9:52 AM   Specimen: BLOOD  Result Value Ref Range Status   Specimen Description BLOOD RIGHT ASSIST CONTROL  Final   Special Requests   Final    BOTTLES DRAWN AEROBIC AND ANAEROBIC Blood Culture results may not be optimal due to an excessive volume of blood received in  culture bottles   Culture   Final    NO GROWTH 3 DAYS Performed at [facility], [address] [place], [place] [address]    Report Status PENDING  Incomplete  Urine culture     Status: Abnormal   Collection Time: 06/14/20  9:52 AM   Specimen: Urine, Random  Result Value Ref Range Status   Specimen Description   Final    URINE, RANDOM Performed at [facility], [address] [place], [place] [address]    Special Requests   Final    NONE Performed at [facility], [address] [place],  [address]    Culture >=100,000 COLONIES/mL ESCHERICHIA COLI (A)  Final   Report Status 06/16/2020 FINAL  Final   Organism ID, Bacteria ESCHERICHIA COLI (A)  Final      Susceptibility   Escherichia coli - MIC*    AMPICILLIN >=32 RESISTANT Resistant     CEFAZOLIN <=4 SENSITIVE Sensitive     CEFEPIME <=0.12 SENSITIVE Sensitive     CEFTRIAXONE <=0.25 SENSITIVE Sensitive     CIPROFLOXACIN <=0.25 SENSITIVE Sensitive  GENTAMICIN <=1 SENSITIVE Sensitive     IMIPENEM <=0.25 SENSITIVE Sensitive     NITROFURANTOIN <=16 SENSITIVE Sensitive     TRIMETH/SULFA <=20 SENSITIVE Sensitive     AMPICILLIN/SULBACTAM >=32 RESISTANT Resistant     PIP/TAZO <=4 SENSITIVE Sensitive     * >=100,000 COLONIES/mL ESCHERICHIA COLI  SARS CORONAVIRUS 2 (TAT 6-24 HRS) Nasopharyngeal Nasopharyngeal Swab     Status: None   Collection Time: 06/14/20 11:54 AM   Specimen: Nasopharyngeal Swab  Result Value Ref Range Status   SARS Coronavirus 2 NEGATIVE NEGATIVE Final    Comment: (NOTE) SARS-CoV-2 target nucleic acids are NOT DETECTED.  The SARS-CoV-2 RNA is generally detectable in upper and lower respiratory specimens during the acute phase of infection. Negative results do not preclude SARS-CoV-2 infection, do not rule out co-infections with other pathogens, and should not be used as the sole basis for treatment or other patient management decisions. Negative results must be  combined with clinical observations, patient history, and epidemiological information. The expected result is Negative.  Fact Sheet for Patients: SugarRoll.be  Fact Sheet for Healthcare Providers: https://www.woods-mathews.com/  This test is not yet approved or cleared by the [place] FDA and  has been authorized for detection and/or diagnosis of SARS-CoV-2 by FDA under an Emergency Use Authorization (EUA). This EUA will remain  in effect (meaning this test can be used) for the duration of the COVID-19 declaration under Se ction 564(b)(1) of the Act, 21 U.S.C. section 360bbb-3(b)(1), unless the authorization is terminated or revoked sooner.  Performed at [facility], [place] [address], [place], [place] [address]         Radiology Studies: No results found.      Scheduled Meds: . amiodarone  100 mg Oral Daily  . cholecalciferol  1,000 Units Oral Daily  . donepezil  10 mg Oral Daily  . enoxaparin (LOVENOX) injection  40 mg Subcutaneous Q24H  . ferrous sulfate  325 mg Oral Q breakfast  . gabapentin  600 mg Oral Daily  . hydrOXYzine  25 mg Oral BID  . insulin aspart  0-5 Units Subcutaneous QHS  . insulin aspart  0-9 Units Subcutaneous TID WC  . levothyroxine  125 mcg Oral Q0600  . linaclotide  72 mcg Oral QAC breakfast  . loratadine  10 mg Oral Daily  . memantine  5 mg Oral QHS  . montelukast  10 mg Oral QHS  . multivitamin with minerals  1 tablet Oral Daily  . pantoprazole  40 mg Oral Daily  . potassium chloride  20 mEq Oral BID  . sertraline  100 mg Oral BID  . traZODone  100 mg Oral QHS  . vitamin B-12  1,000 mcg Oral Daily  . vitamin E  400 Units Oral Daily   Continuous Infusions: . sodium chloride Stopped (06/14/20 1756)  . cefTRIAXone (ROCEPHIN)  IV 2 g (06/17/20 1349)  . lactated ringers 75 mL/hr at 06/16/20 1503     LOS: 2 days     Cordelia Poche, MD [facility] 06/17/2020, 4:00 PM  If  7PM-7AM, please contact night-coverage [email or web address]

## 2020-06-17 NOTE — Progress Notes (Signed)
PT Cancellation Note  Patient Details Name: Kristi [name] MRN: [id number] DOB: 08-Jun-1940   Cancelled Treatment:    Reason Eval/Treat Not Completed: Other (comment). Pt declined PT at this time due to complaints loose BMs, declined mobility. PT informed RN of pt status, and PT to re-attempt as able.   Lieutenant Diego PT, DPT 1:24 PM,06/17/20

## 2020-06-17 NOTE — TOC Progression Note (Addendum)
Transition of Care [facility]) - Progression Note    Patient Details  Name: Kristi [name] MRN: [id number] Date of Birth: 07/05/1940  Transition of Care [facility]) CM/SW Contact  Candie Chroman, LCSW Phone Number: 06/17/2020, 10:23 AM  Clinical Narrative:  Bed offer from [facility] still pending. Left message for admissions coordinator to check status of decision.   12:47 pm: [employer] are reviewing referral. Daughter said patient has [employer]. She will send CSW information once she obtains. If patient has traditional Medicare, she will not have 3 night stay until tomorrow.  2:30 pm: [facility] is not able to make a bed offer. Daughter is aware and wants to confirm she will not have a copay before accepting [facility]'s offer. Left voicemail for admissions coordinator.  3:02 pm: [facility] is able to accept patient and will do a program change to her Medicaid so that she would not have a copay. Left voicemail for daughter.  Expected Discharge Plan: [place] Barriers to Discharge: Continued Medical Work up  Expected Discharge Plan and Services Expected Discharge Plan: [place] Choice: [place] Living arrangements for the past 2 months: [place]                                       Social Determinants of Health (SDOH) Interventions    Readmission Risk Interventions Readmission Risk Prevention Plan 06/16/2020 05/16/2020 01/30/2020  Transportation Screening Complete Complete Complete  Medication Review Press photographer) Complete Complete Complete  PCP or Specialist appointment within 3-5 days of discharge Complete Complete Complete  HRI or Home Care Consult - Complete Complete  SW Recovery Care/Counseling Consult Complete Complete Complete  Palliative Care Screening Not Applicable Not Applicable Not Applicable  Skilled Nursing  Facility Complete Complete Not Applicable  Some recent data might be hidden

## 2020-06-18 LAB — GASTROINTESTINAL PANEL BY PCR, STOOL (REPLACES STOOL CULTURE)

## 2020-06-18 LAB — GLUCOSE, CAPILLARY
Glucose-Capillary: 143 mg/dL — ABNORMAL HIGH (ref 70–99)
Glucose-Capillary: 134 mg/dL — ABNORMAL HIGH (ref 70–99)
Glucose-Capillary: 134 mg/dL — ABNORMAL HIGH (ref 70–99)
Glucose-Capillary: 121 mg/dL — ABNORMAL HIGH (ref 70–99)

## 2020-06-18 LAB — BASIC METABOLIC PANEL
Anion gap: 9 (ref 5–15)
BUN: 10 mg/dL (ref 8–23)
CO2: 29 mmol/L (ref 22–32)
Calcium: 9.4 mg/dL (ref 8.9–10.3)
Chloride: 102 mmol/L (ref 98–111)
Creatinine, Ser: 0.68 mg/dL (ref 0.44–1.00)
GFR, Estimated: >60 mL/min (ref >60)
Glucose, Bld: 128 mg/dL — ABNORMAL HIGH (ref 70–99)
Potassium: 3.6 mmol/L (ref 3.5–5.1)
Sodium: 140 mmol/L (ref 135–145)

## 2020-06-18 LAB — CBC
HCT: 29.5 % — ABNORMAL LOW (ref 36.0–46.0)
Hemoglobin: 9.6 g/dL — ABNORMAL LOW (ref 12.0–15.0)
MCH: 30.2 pg (ref 26.0–34.0)
MCHC: 32.5 g/dL (ref 30.0–36.0)
MCV: 92.8 fL (ref 80.0–100.0)
Platelets: 133 10*3/uL — ABNORMAL LOW (ref 150–400)
RBC: 3.18 MIL/uL — ABNORMAL LOW (ref 3.87–5.11)
RDW: 16.4 % — ABNORMAL HIGH (ref 11.5–15.5)
WBC: 4.4 10*3/uL (ref 4.0–10.5)
nRBC: 0 % (ref 0.0–0.2)

## 2020-06-18 MED ORDER — ZINC OXIDE 40 % EX OINT
TOPICAL_OINTMENT | CUTANEOUS | Status: DC | PRN
  Filled 2020-06-18 (×2): qty 113

## 2020-06-18 NOTE — TOC Progression Note (Signed)
Transition of Care [facility]) - Progression Note    Patient Details  Name: Kristi [name] MRN: [id number] Date of Birth: Feb 22, 1941  Transition of Care [facility]) CM/SW Contact  Candie Chroman, LCSW Phone Number: 06/18/2020, 1:08 PM  Clinical Narrative: [place] admissions coordinator left voicemail for patient's daughter this morning to provide information on Medicaid program change. No call back yet. CSW left daughter a voicemail as well.    Expected Discharge Plan: [place] Barriers to Discharge: Continued Medical Work up  Expected Discharge Plan and Services Expected Discharge Plan: [place] Choice: [place] Living arrangements for the past 2 months: [place]                                       Social Determinants of Health (SDOH) Interventions    Readmission Risk Interventions Readmission Risk Prevention Plan 06/16/2020 05/16/2020 01/30/2020  Transportation Screening Complete Complete Complete  Medication Review Press photographer) Complete Complete Complete  PCP or Specialist appointment within 3-5 days of discharge Complete Complete Complete  HRI or Home Care Consult - Complete Complete  SW Recovery Care/Counseling Consult Complete Complete Complete  Palliative Care Screening Not Applicable Not Applicable Not Applicable  Skilled Nursing Facility Complete Complete Not Applicable  Some recent data might be hidden

## 2020-06-18 NOTE — Care Management Important Message (Signed)
Important Message  Patient Details  Name: Kristi [name] MRN: [id number] Date of Birth: 01/15/1941   Medicare Important Message Given:  Yes     Dannette Barbara 06/18/2020, 12:50 PM

## 2020-06-18 NOTE — Progress Notes (Signed)
MD aware of patient's loose stools. New orders followed.

## 2020-06-18 NOTE — Progress Notes (Signed)
PROGRESS NOTE    Kristi [name]  ZOX:[id number] DOB: Sep 07, 1940 DOA: 06/14/2020 PCP: [facility]   Brief Narrative: Kristi [name] is a 80 y.o. female with history of hypertension, hyperlipidemia, diabetes mellitus, stroke, GERD, hypothyroidism, depression, anxiety, status post pacemaker placement, Parkinson disease, CAD/CABG, uterine cancer, diastolic heart failure, age fibrillation, chronic pain, recent GI bleed.  Patient presented secondary to generalized weakness, dysuria in setting of UTI.  Patient was started on empiric antibiotics with ceftriaxone IV.  Urine cultures positive for E. coli urinary infection.   Assessment & Plan:   Principal Problem:   UTI (urinary tract infection) Active Problems:   Anxiety and depression   CAD (coronary artery disease)   GERD (gastroesophageal reflux disease)   Essential hypertension   Hypothyroidism   Hypokalemia   Chronic diastolic CHF (congestive heart failure) (HCC)   Type II diabetes mellitus with renal manifestations (HCC)   Atrial fibrillation, chronic (HCC)   Acute renal 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HCC)   Lactic acidosis   UTI Started empirically on Ceftriaxone IV. Urine culture significant for E. Coli which is resistant to ampicillin. Blood culture with no growth. -Continue Ceftriaxone IV x5 days  Hypokalemia Resolved with repletion.  Lactic acidosis Resolved with IV fluids.  AKI on CKD stage IIIb Baseline creatinine of about 0.8. Creatinine of 2.07 on admission in setting of dehydration. Resolved with IV fluids.  Diabetes mellitus type 2 Patient is on [place] as an outpatient. Started on SSI while inpatient. -Continue SSI  Peripheral neuropathy -Continue gabapentin  Diarrhea Does not appear to be infectious in etiology. Possibly related to previous bowel regimen. Possibly related to antibiotics although patient states she has not had this issue before with antibiotics. GI pathogen  panel obtained overnight and was negative. -Watch clinically  Paroxysmal atrial fibrillation Patient is on amiodarone 100 mg daily. Metoprolol tartrate 12.5 mg daily listed as home medication, however per recent discharge summary, she was discharged on metoprolol succinate. Patient was previously on Eliquis which was discontinued on discharge from last hospitalization with recommendation to restart in one week. -Continue amiodarone -Continue to hold metoprolol succinate 12.5 mg daily secondary to borderline heart rate -Restart Eliquis 5 mg BID (no dose adjustment required)  CAD/CABG Noted. Follows with cardiology as an outpatient. Has nitroglycerin as needed as an outpatient.  Chronic diastolic heart failure Patient is on torsemide as an outpatient which was held on admission secondary to AKI. Currently appears to be euvolemic. Weight has trended up and now appears stable. IV fluids discontinued. -Daily weights  Hypothyroidism -Continue Synthroid 125 mcg daily  Dementia Depression -Continue Namenda and Aricept -Continue Zoloft   DVT prophylaxis: Lovenox. Restart Eliquis tomorrow. Code Status:   Code Status: Full Code Family Communication: None at bedside Disposition Plan: Discharge to SNF once bed is available. Medically stable for discharge.   Consultants:   None  Procedures:   None  Antimicrobials:  Ceftriaxone IV    Subjective: Multiple frequent stools. No abdominal pain. No other concerns.  Objective: Vitals:   06/18/20 0402 06/18/20 0410 06/18/20 0855 06/18/20 1151  BP: (!) 132/54  (!) 135/59 114/63  Pulse: 60  63 60  Resp: 16  16   Temp: 98.4 F (36.9 C)  98.7 F (37.1 C) 98.6 F (37 C)  TempSrc: Oral  Oral Oral  SpO2: 93%  98% 94%  Weight:  88.7 kg    Height:        Intake/Output Summary (Last 24 hours) at 06/18/2020  [place] filed at 06/17/2020 2105 Gross per 24 hour  Intake 240 ml  Output 500 ml  Net -260 ml   Filed Weights    06/16/20 0500 06/17/20 0500 06/18/20 0410  Weight: 88.7 kg 88.2 kg 88.7 kg    Examination:  General exam: Appears calm and comfortable Respiratory system: Clear to auscultation. Respiratory effort normal. Cardiovascular system: S1 & S2 heard Gastrointestinal system: Abdomen is nondistended, soft and nontender. No organomegaly or masses felt. Normal bowel sounds heard. Central nervous system: Alert and oriented. No focal neurological deficits. Musculoskeletal: No edema. No calf tenderness Skin: No cyanosis. No rashes Psychiatry: Judgement and insight appear normal. Mood & affect appropriate.     Data Reviewed: I have personally reviewed following labs and imaging studies  CBC Lab Results  Component Value Date   WBC 4.4 06/18/2020   RBC 3.18 (L) 06/18/2020   HGB 9.6 (L) 06/18/2020   HCT 29.5 (L) 06/18/2020   MCV 92.8 06/18/2020   MCH 30.2 06/18/2020   PLT 133 (L) 06/18/2020   MCHC 32.5 06/18/2020   RDW 16.4 (H) 06/18/2020   LYMPHSABS 1.4 06/14/2020   MONOABS 0.5 06/14/2020   EOSABS 0.1 06/14/2020   BASOSABS 0.0 16/12/9602     Last metabolic panel Lab Results  Component Value Date   NA 140 06/18/2020   K 3.6 06/18/2020   CL 102 06/18/2020   CO2 29 06/18/2020   BUN 10 06/18/2020   CREATININE 0.68 06/18/2020   GLUCOSE 128 (H) 06/18/2020   GFRNONAA >60 06/18/2020   GFRAA 58 (L) 10/28/2019   CALCIUM 9.4 06/18/2020   PROT 6.9 06/14/2020   ALBUMIN 3.4 (L) 06/14/2020   BILITOT 0.5 06/14/2020   ALKPHOS 79 06/14/2020   AST 32 06/14/2020   ALT 33 06/14/2020   ANIONGAP 9 06/18/2020    CBG (last 3)  Recent Labs    06/17/20 2059 06/18/20 0744 06/18/20 1151  GLUCAP 140* 143* 134*     GFR: Estimated Creatinine Clearance: 61.9 mL/min (by C-G formula based on SCr of 0.68 mg/dL).  Coagulation Profile: Recent Labs  Lab 06/14/20 0952  INR 1.2    Recent Results (from the past 240 hour(s))  Blood culture (routine single)     Status: None (Preliminary result)    Collection Time: 06/14/20  9:52 AM   Specimen: BLOOD  Result Value Ref Range Status   Specimen Description BLOOD RIGHT ASSIST CONTROL  Final   Special Requests   Final    BOTTLES DRAWN AEROBIC AND ANAEROBIC Blood Culture results may not be optimal due to an excessive volume of blood received in culture bottles   Culture   Final    NO GROWTH 4 DAYS Performed at [facility], [address], [place], [place] [address]    Report Status PENDING  Incomplete  Urine culture     Status: Abnormal   Collection Time: 06/14/20  9:52 AM   Specimen: Urine, Random  Result Value Ref Range Status   Specimen Description   Final    URINE, RANDOM Performed at [facility], [address], [place], [place] [address]    Special Requests   Final    NONE Performed at [facility], [address] [place], [place] [address]    Culture >=100,000 COLONIES/mL ESCHERICHIA COLI (A)  Final   Report Status 06/16/2020 FINAL  Final   Organism ID, Bacteria ESCHERICHIA COLI (A)  Final      Susceptibility   Escherichia coli - MIC*  AMPICILLIN >=32 RESISTANT Resistant     CEFAZOLIN <=4 SENSITIVE Sensitive     CEFEPIME <=0.12 SENSITIVE Sensitive     CEFTRIAXONE <=0.25 SENSITIVE Sensitive     CIPROFLOXACIN <=0.25 SENSITIVE Sensitive     GENTAMICIN <=1 SENSITIVE Sensitive     IMIPENEM <=0.25 SENSITIVE Sensitive     NITROFURANTOIN <=16 SENSITIVE Sensitive     TRIMETH/SULFA <=20 SENSITIVE Sensitive     AMPICILLIN/SULBACTAM >=32 RESISTANT Resistant     PIP/TAZO <=4 SENSITIVE Sensitive     * >=100,000 COLONIES/mL ESCHERICHIA COLI  SARS CORONAVIRUS 2 (TAT 6-24 HRS) Nasopharyngeal Nasopharyngeal Swab     Status: None   Collection Time: 06/14/20 11:54 AM   Specimen: Nasopharyngeal Swab  Result Value Ref Range Status   SARS Coronavirus 2 NEGATIVE NEGATIVE Final    Comment: (NOTE) SARS-CoV-2 target nucleic acids are NOT DETECTED.  The SARS-CoV-2 RNA is generally detectable in upper and  lower respiratory specimens during the acute phase of infection. Negative results do not preclude SARS-CoV-2 infection, do not rule out co-infections with other pathogens, and should not be used as the sole basis for treatment or other patient management decisions. Negative results must be combined with clinical observations, patient history, and epidemiological information. The expected result is Negative.  Fact Sheet for Patients: SugarRoll.be  Fact Sheet for Healthcare Providers: https://www.woods-mathews.com/  This test is not yet approved or cleared by the [place] FDA and  has been authorized for detection and/or diagnosis of SARS-CoV-2 by FDA under an Emergency Use Authorization (EUA). This EUA will remain  in effect (meaning this test can be used) for the duration of the COVID-19 declaration under Se ction 564(b)(1) of the Act, 21 U.S.C. section 360bbb-3(b)(1), unless the authorization is terminated or revoked sooner.  Performed at [facility], [place] [address], [place], [place] [address]   Gastrointestinal Panel by PCR , Stool     Status: None   Collection Time: 06/18/20  8:50 AM   Specimen: Stool  Result Value Ref Range Status   Campylobacter species NOT DETECTED NOT DETECTED Final   Plesimonas shigelloides NOT DETECTED NOT DETECTED Final   Salmonella species NOT DETECTED NOT DETECTED Final   Yersinia enterocolitica NOT DETECTED NOT DETECTED Final   Vibrio species NOT DETECTED NOT DETECTED Final   Vibrio cholerae NOT DETECTED NOT DETECTED Final   Enteroaggregative E coli (EAEC) NOT DETECTED NOT DETECTED Final   Enteropathogenic E coli (EPEC) NOT DETECTED NOT DETECTED Final   Enterotoxigenic E coli (ETEC) NOT DETECTED NOT DETECTED Final   Shiga like toxin producing E coli (STEC) NOT DETECTED NOT DETECTED Final   Shigella/Enteroinvasive E coli (EIEC) NOT DETECTED NOT DETECTED Final   Cryptosporidium NOT DETECTED NOT  DETECTED Final   Cyclospora cayetanensis NOT DETECTED NOT DETECTED Final   Entamoeba histolytica NOT DETECTED NOT DETECTED Final   Giardia lamblia NOT DETECTED NOT DETECTED Final   Adenovirus F40/41 NOT DETECTED NOT DETECTED Final   Astrovirus NOT DETECTED NOT DETECTED Final   Norovirus GI/GII NOT DETECTED NOT DETECTED Final   Rotavirus A NOT DETECTED NOT DETECTED Final   Sapovirus (I, II, IV, and V) NOT DETECTED NOT DETECTED Final    Comment: Performed at [facility], [address], [place], [place] [address]        Radiology Studies: No results found.      Scheduled Meds: . amiodarone  100 mg Oral Daily  . apixaban  5 mg Oral BID  . cholecalciferol  1,000 Units Oral  Daily  . donepezil  10 mg Oral Daily  . ferrous sulfate  325 mg Oral Q breakfast  . gabapentin  600 mg Oral Daily  . hydrOXYzine  25 mg Oral BID  . insulin aspart  0-5 Units Subcutaneous QHS  . insulin aspart  0-9 Units Subcutaneous TID WC  . levothyroxine  125 mcg Oral Q0600  . linaclotide  72 mcg Oral QAC breakfast  . loratadine  10 mg Oral Daily  . memantine  5 mg Oral QHS  . montelukast  10 mg Oral QHS  . multivitamin with minerals  1 tablet Oral Daily  . pantoprazole  40 mg Oral Daily  . sertraline  100 mg Oral BID  . traZODone  100 mg Oral QHS  . vitamin B-12  1,000 mcg Oral Daily  . vitamin E  400 Units Oral Daily   Continuous Infusions: . sodium chloride Stopped (06/14/20 1756)  . cefTRIAXone (ROCEPHIN)  IV 2 g (06/18/20 1232)     LOS: 3 days     Cordelia Poche, MD [facility] 06/18/2020, 1:53 PM  If 7PM-7AM, please contact night-coverage [email or web address]

## 2020-06-19 ENCOUNTER — Inpatient Hospital Stay: Payer: Medicare Other

## 2020-06-19 LAB — CULTURE, BLOOD (SINGLE): Culture: NO GROWTH

## 2020-06-19 LAB — GLUCOSE, CAPILLARY
Glucose-Capillary: 128 mg/dL — ABNORMAL HIGH (ref 70–99)
Glucose-Capillary: 122 mg/dL — ABNORMAL HIGH (ref 70–99)

## 2020-06-19 LAB — SARS CORONAVIRUS 2 (TAT 6-24 HRS): SARS Coronavirus 2: NEGATIVE

## 2020-06-19 MED ORDER — LOPERAMIDE HCL 2 MG PO CAPS: 2.0000 mg | ORAL_CAPSULE | ORAL | 0 refills | Status: DC | PRN

## 2020-06-19 MED ORDER — GABAPENTIN 600 MG PO TABS: 600.0000 mg | ORAL_TABLET | Freq: Every day | ORAL | Status: DC

## 2020-06-19 MED ORDER — APIXABAN 5 MG PO TABS: 5.0000 mg | ORAL_TABLET | Freq: Two times a day (BID) | ORAL | Status: DC

## 2020-06-19 MED ORDER — LOPERAMIDE HCL 2 MG PO CAPS
2.0000 mg | ORAL_CAPSULE | ORAL | Status: DC | PRN
  Administered 2020-06-19: 2 mg via ORAL
  Filled 2020-06-19: qty 1

## 2020-06-19 MED ORDER — METOPROLOL SUCCINATE ER 25 MG PO TB24: 12.5000 mg | ORAL_TABLET | Freq: Every day | ORAL | Status: AC

## 2020-06-19 NOTE — Discharge Instructions (Signed)
Kristi [name],  You are in the hospital because of urinary tract infection.  This was treated with IV antibiotics and has resolved.

## 2020-06-19 NOTE — Progress Notes (Signed)
Report called to [facility] to [place], [place]. Pt to go to room 26 P. PIV x2 removed with tip intact. Discharge instructions sent with patient. Pt denies any needs or concerns. Pt's daughter, [name], updated via voicemail. No needs or concerns at this time, transport to pick up patient.

## 2020-06-19 NOTE — TOC Transition Note (Signed)
Transition of Care [facility]) - CM/SW Discharge Note   Patient Details  Name: Kristi [name] MRN: [id number] Date of Birth: 1941/03/20  [facility] CM/SW Contact:  Candie Chroman, LCSW Phone Number: 06/19/2020, 1:58 PM   Clinical Narrative: Patient has orders to discharge to [facility] today. RN is calling report now. EMS transport has been arranged and she is next on the list. No further concerns. CSW signing off.    Final next level of care: [place] Barriers to Discharge: Barriers Resolved   Patient Goals and CMS Choice     Choice offered to / list presented to : [place]  Discharge Placement   Existing PASRR number confirmed : 06/16/20          Patient chooses bed at: [facility] Patient to be transferred to facility by: EMS Name of family member notified: Lamar Benes Patient and family notified of of transfer: 06/19/20  Discharge Plan and Services     Post Acute Care Choice: [facility]                               Social Determinants of Health (SDOH) Interventions     Readmission Risk Interventions Readmission Risk Prevention Plan 06/16/2020 05/16/2020 01/30/2020  Transportation Screening Complete Complete Complete  Medication Review Press photographer) Complete Complete Complete  PCP or Specialist appointment within 3-5 days of discharge Complete Complete Complete  HRI or Home Care Consult - Complete Complete  SW Recovery Care/Counseling Consult Complete Complete Complete  Palliative Care Screening Not Applicable Not Applicable Not Applicable  Skilled Nursing Facility Complete Complete Not Applicable  Some recent data might be hidden

## 2020-06-19 NOTE — Plan of Care (Signed)

## 2020-06-19 NOTE — Discharge Summary (Signed)
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 cholerae NOT DETECTED NOT DETECTED Final   Enteroaggregative E coli (EAEC) NOT DETECTED NOT DETECTED Final   Enteropathogenic E coli (EPEC) NOT DETECTED NOT DETECTED Final   Enterotoxigenic E coli (ETEC) NOT DETECTED NOT DETECTED Final   Shiga like toxin producing E coli (STEC) NOT DETECTED NOT  DETECTED Final   Shigella/Enteroinvasive E coli (EIEC) NOT DETECTED NOT DETECTED Final   Cryptosporidium NOT DETECTED NOT DETECTED Final   Cyclospora cayetanensis NOT DETECTED NOT DETECTED Final   Entamoeba histolytica NOT DETECTED NOT DETECTED Final   Giardia lamblia NOT DETECTED NOT DETECTED Final   Adenovirus F40/41 NOT DETECTED NOT DETECTED Final   Astrovirus NOT DETECTED NOT DETECTED Final   Norovirus GI/GII NOT DETECTED NOT DETECTED Final   Rotavirus A NOT DETECTED NOT DETECTED Final   Sapovirus (I, II, IV, and V) NOT DETECTED NOT DETECTED Final    Comment: Performed at [facility], [address] [place], [place] [address]  SARS CORONAVIRUS 2 (TAT 6-24 HRS) Nasopharyngeal Nasopharyngeal Swab     Status: None   Collection Time: 06/18/20  9:53 PM   Specimen: Nasopharyngeal Swab  Result Value Ref Range Status   SARS Coronavirus 2 NEGATIVE NEGATIVE Final    Comment: (NOTE) SARS-CoV-2 target nucleic acids are NOT DETECTED.  The SARS-CoV-2 RNA is generally detectable in upper and lower respiratory specimens during the acute phase of infection. Negative results do not preclude SARS-CoV-2 infection, do not rule out co-infections with other pathogens, and should not be used as the sole basis for treatment or other patient management decisions. Negative results must be combined with clinical observations, patient history, and epidemiological information. The expected result is Negative.  Fact Sheet for Patients: SugarRoll.be  Fact Sheet for Healthcare Providers: https://www.woods-mathews.com/  This test is not yet approved or cleared by the [place] FDA and  has been authorized for detection and/or diagnosis of SARS-CoV-2 by FDA under an Emergency Use Authorization (EUA). This EUA will remain  in effect (meaning this test can be used) for the duration of the COVID-19 declaration under Se ction 564(b)(1) of the Act, 21  U.S.C. section 360bbb-3(b)(1), unless the authorization is terminated or revoked sooner.  Performed at [facility], [place] [address] [place], [place] [address]      Time coordinating discharge: 35 minutes  SIGNED:   Cordelia Poche, MD [facility] 06/19/2020, 12:32 PM

## 2020-06-25 NOTE — Progress Notes (Signed)
Remote pacemaker transmission.   

## 2020-07-02 ENCOUNTER — Ambulatory Visit: Payer: Medicare Other | Admitting: Podiatry

## 2020-07-08 ENCOUNTER — Ambulatory Visit: Payer: Medicare Other

## 2020-08-06 ENCOUNTER — Encounter: Payer: Medicare Other | Admitting: Internal Medicine

## 2020-11-30 ENCOUNTER — Ambulatory Visit (INDEPENDENT_AMBULATORY_CARE_PROVIDER_SITE_OTHER): Payer: Medicare Other | Admitting: Nurse Practitioner

## 2020-11-30 ENCOUNTER — Encounter (INDEPENDENT_AMBULATORY_CARE_PROVIDER_SITE_OTHER): Payer: Self-pay | Admitting: Nurse Practitioner

## 2020-12-11 ENCOUNTER — Encounter: Payer: Self-pay | Admitting: Nurse Practitioner

## 2020-12-11 ENCOUNTER — Other Ambulatory Visit: Payer: Self-pay

## 2020-12-11 ENCOUNTER — Non-Acute Institutional Stay: Payer: Medicare Other | Admitting: Nurse Practitioner

## 2020-12-11 VITALS — BP 134/74 | HR 78 | Temp 98.0°F | Resp 18 | Wt 181.9 lb

## 2020-12-11 DIAGNOSIS — R0602 Shortness of breath: Secondary | ICD-10-CM

## 2020-12-11 DIAGNOSIS — Z515 Encounter for palliative care: Secondary | ICD-10-CM

## 2020-12-11 DIAGNOSIS — I5032 Chronic diastolic (congestive) heart failure: Secondary | ICD-10-CM

## 2020-12-11 NOTE — Progress Notes (Signed)
[facility] Palliative Care Consult Note Telephone: [phone]  Fax: [phone]   Date of encounter: 12/11/20 8:46 PM PATIENT NAME: Kristi [name] [address] [place] [address]   [phone] (home)  DOB: 02-04-1941 MRN: [id number] PRIMARY CARE PROVIDER:    Dr Lucianne Lei [facility]  RESPONSIBLE PARTY:    Contact Information     Name Relation Home Work Mobile   [place] Daughter [phone]    Ophelia Shoulder Daughter [phone]  [phone]      I met face to face with patient in facility. Palliative Care was asked to follow this patient by consultation request of  Dr Nona Dell to address advance care planning and complex medical decision making. This is the initial visit.              ASSESSMENT AND PLAN / RECOMMENDATIONS:  Symptom Management/Plan: 1. Advance Care Planning; Full code, full scope   2. Shortness of breath secondary to CAD, dCHF, continue torsemide, supplemental O2, monitor for edema, respiratory status. Discussed with [name] about her heart, afib, chf though limited with dementia. Will f/u with daughter  3. Palliative care encounter; Palliative care encounter; Palliative medicine team will continue to support patient, patient's family, and medical team. Visit consisted of counseling and education dealing with the complex and emotionally intense issues of symptom management and palliative care in the setting of serious and potentially life-threatening illness  4. f/u 2 weeks for ongoing monitoring chronic disease progression, ongoing discussions complex medical decision making  Follow up Palliative Care Visit: Palliative care will continue to follow for complex medical decision making, advance care planning, and clarification of goals. Return 2 weeks or prn.  I spent 47 minutes providing this consultation. More than 50% of the time in this consultation was spent in  counseling and care coordination.  PPS: 40%  Chief Complaint: Initial palliative consult for complex medical decision making  HISTORY OF PRESENT ILLNESS:  Kristi [name] is a 80 y.o. year old female  with medical history significant of hypertension, hyperlipidemia, diabetes mellitus, stroke, GERD, hypothyroidism, depression with anxiety, pacemaker placement, Parkinson's disease, CAD, myocardial infarction, uterine cancer, dCHF, atrial fibrillation not on anticoagulants, chronic pain syndrome, GI bleeding, dementia, iron deficiency anemia. [name] resides at SNF at [facility]. [name] does require assistance with transfers as she is able to sit in w/c with supplemental O2. [name] does require assistance with bathing, dressing as she gets fatigue easily. Staff endorses [name] does feed herself with fair appetite depending on what is being served. BMI 33.3 with 6lb weight gain with current weight 181.9 lbs. [name] is a full code. At present [name] is sitting in the w/c just completed her lunch with eating about 75%. We talked about purpose of pc visit. We talked about life review as she is widowed. PMH limited with mild cognitive impairment. We talked about symptoms of pain, shortness of breath. We talked about sleep patterns, sleep hygiene. We talked about appetite, foods that she likes. We talked about residing at [facility]. [name] endorses he daughter visits daily. Emotional support provided. [name] was cooperative with assessment. I have attempted to contact [name] daughter for further discussion of goc, code status, disease progression. Limited discussion with [name] with cognitive impairment, difficulty with memory. I updated staff.   History obtained from review of EMR, discussion with facility staff and  [name].  I reviewed available labs,  medications, imaging, studies and related documents from the EMR.  Records reviewed and summarized  above.   ROS Full 14 system review of systems performed and negative with exception of: as per HPI.   Physical Exam: Constitutional: NAD General: chronically ill, pleasant female EYES: lids intact ENMT: oral mucous membranes moist CV: S1S2, RRR, Pulmonary: LCTA, no increased work of breathing, no cough,O2 supplemental Abdomen: soft and non tender MSK: w/c dependent Skin: warm and dry Neuro:  + generalized weakness,  no cognitive impairment Psych: non-anxious affect, A and O x 3  CURRENT PROBLEM LIST:  Patient Active Problem List   Diagnosis Date Noted   UTI (urinary tract infection) 06/14/2020   Type II diabetes mellitus with renal manifestations ([place]) 06/14/2020   Atrial fibrillation, chronic ([place]) 06/14/2020   Acute renal 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[place]) 06/14/2020   Lactic acidosis 06/14/2020   Diarrhea    Myoclonus    Weakness    AKI (acute kidney injury) ([place]) 05/16/2020   Transaminitis 05/16/2020   GI bleed 01/28/2020   Rectal bleed 01/28/2020   At risk for polypharmacy 01/28/2020   Acute delirium    HCAP (healthcare-associated pneumonia)    Hypotension    Hyponatremia    Lobar pneumonia ([place]) 10/23/2019   Atrial flutter (HCC)    Dizziness    Atrial fibrillation status post cardioversion ([place]) 09/27/2019   CHF (congestive heart failure) (HCC) 09/26/2019   Chronic diastolic CHF (congestive heart failure) ([place]) 09/25/2019   Hypertensive urgency 09/25/2019   Fluid overload 09/25/2019   Excessive gas 05/14/2019   Generalized bloating 05/14/2019   Bright red blood per rectum 05/14/2019   Major depressive disorder, recurrent severe without psychotic features ([place]) 12/01/2018   Chest pain 11/20/2018   Difficulty walking 11/19/2018   Insomnia 11/15/2018   Type 2 diabetes mellitus with hypoglycemia without coma, with long-term current use of insulin ([place]) 11/15/2018   Goals of care, counseling/discussion 02/06/2018   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   Rectal mass 01/05/2018   Leg pain, bilateral 12/06/2017   Lymphedema 07/10/2017   Atherosclerosis of native arteries of the extremities with ulceration ([place]) 07/10/2017   Stricture and stenosis of esophagus    Dysphagia    Syncope and collapse 04/11/2017   Mild dementia ([place]) 01/24/2017   Lumbar facet osteoarthritis (Bilateral) 01/17/2017   Acute postoperative pain 01/17/2017   Hypokalemia 12/24/2016   Anxiety 12/07/2016   Cardiac pacemaker in situ 12/07/2016   Cerebrovascular accident ([place]) 12/07/2016   Chronic depression 12/07/2016   Essential hypertension 12/07/2016   Hypothyroidism 12/07/2016   Mixed hyperlipidemia 12/07/2016   Myocardial infarction ([place]) 12/07/2016   TIA (transient ischemic attack) 11/17/2016   Parkinson disease ([place]) 10/27/2016   Tremor 10/27/2016   Chronic knee pain (B) (R>L) 10/19/2016   Ischemic chest pain ([place]) 09/29/2016   Abnormal CT scan, lumbar spine 09/08/2016   Lumbar foraminal stenosis (multilevel) 09/08/2016   Fracture of clavicle 08/03/2016   Polyneuropathy 05/17/2016   Neurogenic pain 04/12/2016   Chronic lower extremity pain (Bilateral) (R>L) 04/12/2016   Chronic lower extremity radicular pain (Primary Source of Pain) (Bilateral) (R>L) 04/12/2016   Lower extremity weakness 04/12/2016   Chronic low back pain (Secondary source of pain) (Bilateral) (R>L) 04/12/2016   Chronic wrist pain (Left) (secondary to fracture) 04/12/2016   Lumbar spondylosis 04/12/2016   Chronic pain syndrome 04/11/2016   Long term current use of opiate analgesic 04/11/2016   Long term prescription opiate use 04/11/2016  Opiate use 04/11/2016   Long term prescription benzodiazepine use 04/11/2016   Lumbar radiculopathy (Multilevel) (Bilateral) 05/07/2015   Lumbar facet syndrome (Bilateral) (R>L) 08/21/2014   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, L3-4, L4-5) 08/13/2014   Sacroiliac joint dysfunction (Bilateral) 08/13/2014   Frequent  falls 06/04/2014   Memory loss 06/04/2014   Anxiety and depression 08/22/2013   CAD (coronary artery disease) 08/22/2013   Therapeutic opioid-induced constipation (OIC) 08/22/2013   GERD (gastroesophageal reflux disease) 08/22/2013   PAST MEDICAL HISTORY:  Active Ambulatory Problems    Diagnosis Date Noted   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, L3-4, L4-5) 08/13/2014   Sacroiliac joint dysfunction (Bilateral) 08/13/2014   Lumbar facet syndrome (Bilateral) (R>L) 08/21/2014   Lumbar radiculopathy (Multilevel) (Bilateral) 05/07/2015   Anxiety and depression 08/22/2013   CAD (coronary artery disease) 08/22/2013   Therapeutic opioid-induced constipation (OIC) 08/22/2013   Frequent falls 06/04/2014   GERD (gastroesophageal reflux disease) 08/22/2013   Memory loss 06/04/2014   Chronic pain syndrome 04/11/2016   Long term current use of opiate analgesic 04/11/2016   Long term prescription opiate use 04/11/2016   Opiate use 04/11/2016   Long term prescription benzodiazepine use 04/11/2016   Neurogenic pain 04/12/2016   Chronic lower extremity pain (Bilateral) (R>L) 04/12/2016   Chronic lower extremity radicular pain (Primary Source of Pain) (Bilateral) (R>L) 04/12/2016   Lower extremity weakness 04/12/2016   Chronic low back pain (Secondary source of pain) (Bilateral) (R>L) 04/12/2016   Chronic wrist pain (Left) (secondary to fracture) 04/12/2016   Lumbar spondylosis 04/12/2016   Fracture of clavicle 08/03/2016   Polyneuropathy 05/17/2016   Abnormal CT scan, lumbar spine 09/08/2016   Lumbar foraminal stenosis (multilevel) 09/08/2016   Ischemic chest pain ([place]) 09/29/2016   Chronic knee pain (B) (R>L) 10/19/2016   Parkinson disease ([place]) 10/27/2016   Tremor 10/27/2016   TIA (transient ischemic attack) 11/17/2016   Anxiety 12/07/2016   Cardiac pacemaker in situ 12/07/2016   Cerebrovascular accident ([place]) 12/07/2016   Chronic depression 12/07/2016   Essential hypertension  12/07/2016   Hypothyroidism 12/07/2016   Mixed hyperlipidemia 12/07/2016   Myocardial infarction ([place]) 12/07/2016   Hypokalemia 12/24/2016   Lumbar facet osteoarthritis (Bilateral) 01/17/2017   Acute postoperative pain 01/17/2017   Syncope and collapse 04/11/2017   Dysphagia    Stricture and stenosis of esophagus    Lymphedema 07/10/2017   Atherosclerosis of native arteries of the extremities with ulceration ([place]) 07/10/2017   Rectal mass 01/05/2018   Cancer of anal canal ([place]) 01/25/2018   Goals of care, counseling/discussion 02/06/2018   Insomnia 11/15/2018   Leg pain, bilateral 12/06/2017   Mild dementia ([place]) 01/24/2017   Type 2 diabetes mellitus with hypoglycemia without coma, with long-term current use of insulin ([place]) 11/15/2018   Chest pain 11/20/2018   Major depressive disorder, recurrent severe without psychotic features ([place]) 12/01/2018   Difficulty walking 11/19/2018   Excessive gas 05/14/2019   Generalized bloating 05/14/2019   Bright red blood per rectum 05/14/2019   Chronic diastolic CHF (congestive heart failure) ([place]) 09/25/2019   Hypertensive urgency 09/25/2019   Fluid overload 09/25/2019   CHF (congestive heart failure) ([place]) 09/26/2019   Atrial fibrillation status post cardioversion ([place]) 09/27/2019   Lobar pneumonia ([place]) 10/23/2019   Atrial flutter (HCC)    Dizziness    Hypotension    Hyponatremia    Acute delirium    HCAP (healthcare-associated pneumonia)    GI bleed 01/28/2020   Rectal bleed 01/28/2020   At risk for polypharmacy  01/28/2020   AKI (acute kidney injury) ([place]) 05/16/2020   Transaminitis 05/16/2020   Diarrhea    Myoclonus    Weakness    UTI (urinary tract infection) 06/14/2020   Type II diabetes mellitus with renal manifestations ([place]) 06/14/2020   Atrial fibrillation, chronic ([place]) 06/14/2020   Acute renal 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[place]) 06/14/2020   Lactic acidosis 06/14/2020   Resolved Ambulatory  Problems    Diagnosis Date Noted   Neck pain 06/04/2014   Persistent atrial fibrillation (HCC)    GI bleeding 05/15/2020   Past Medical History:  Diagnosis Date   Arthritis    Broken arm    Broken arm    Cancer ([place])    Cervical disc disorder    Depression    Diabetes mellitus without complication ([place])    Headache    Hyperlipidemia    Hypertension    Hypothyroid    Parkinson's disease ([place])    Presence of permanent cardiac pacemaker    Spinal stenosis    SOCIAL HX:  Social History   Tobacco Use   Smoking status: Never   Smokeless tobacco: Never  Substance Use Topics   Alcohol use: No   FAMILY HX:  Family History  Problem Relation Age of Onset   Cancer Mother    Depression Mother    Diabetes Mother    Hypertension Mother    Aneurysm Mother    Heart disease Father    Diabetes Sister    Diabetes Brother    Diabetes Brother     reviewed  ALLERGIES:  Allergies  Allergen Reactions   Penicillins Anaphylaxis, Swelling and Rash    TOLERATES CEFTRIAXONE     PERTINENT MEDICATIONS:  Outpatient Encounter Medications as of 12/11/2020  Medication Sig   acetaminophen (TYLENOL) 325 MG tablet Take 2 tablets (650 mg total) by mouth every 6 (six) hours as needed for mild pain (or Fever >/= 101).   albuterol (VENTOLIN HFA) 108 (90 Base) MCG/ACT inhaler Inhale 2 puffs into the lungs every 6 (six) hours as needed for wheezing or shortness of breath. (Patient not taking: No sig reported)   amiodarone (PACERONE) 100 MG tablet Take 1 tablet (100 mg total) by mouth daily.   apixaban (ELIQUIS) 5 MG TABS tablet Take 1 tablet (5 mg total) by mouth 2 (two) times daily.   cholecalciferol (VITAMIN D3) 25 MCG (1000 UNIT) tablet Take 1,000 Units by mouth daily.   cholestyramine (QUESTRAN) 4 g packet Take 1 packet (4 g total) by mouth daily. in water   donepezil (ARICEPT) 10 MG tablet Take 10 mg by mouth daily.    Ferrous Sulfate (SLOW FE) 142 (45 Fe) MG TBCR Take 1 tablet by mouth twice  daily   gabapentin (NEURONTIN) 600 MG tablet Take 1 tablet (600 mg total) by mouth daily.   hydrocortisone (ANUSOL-HC) 25 MG suppository Place 1 suppository (25 mg total) rectally 2 (two) times daily as needed for hemorrhoids or anal itching. Okay to substitute generic   hydrocortisone 2.5 % cream Apply 1 application topically 2 (two) times daily.   hydrOXYzine (ATARAX/VISTARIL) 25 MG tablet Take 25 mg by mouth 2 (two) times daily.   levothyroxine (SYNTHROID) 125 MCG tablet Take 125 mcg by mouth daily.    linaclotide (LINZESS) 72 MCG capsule Take 72 mcg by mouth daily before breakfast.   linagliptin (TRADJENTA) 5 MG TABS tablet Take 1 tablet (5 mg total) by mouth daily.   loperamide (IMODIUM) 2 MG capsule Take  1 capsule (2 mg total) by mouth as needed for diarrhea or loose stools.   loratadine (CLARITIN) 10 MG tablet Take 10 mg by mouth daily.    meclizine (ANTIVERT) 12.5 MG tablet Take 1 tablet (12.5 mg total) by mouth 2 (two) times daily as needed for dizziness.   memantine (NAMENDA) 5 MG tablet Take 5 mg by mouth at bedtime.   metoprolol succinate (TOPROL XL) 25 MG 24 hr tablet Take 0.5 tablets (12.5 mg total) by mouth daily.   montelukast (SINGULAIR) 10 MG tablet Take 10 mg by mouth at bedtime.    Multiple Vitamin (MULTIVITAMIN WITH MINERALS) TABS tablet Take 1 tablet by mouth daily.   nitroGLYCERIN (NITROSTAT) 0.4 MG SL tablet Place 0.4 mg under the tongue every 5 (five) minutes as needed for chest pain.    omeprazole (PRILOSEC) 40 MG capsule Take 40 mg by mouth 2 (two) times daily.   potassium chloride SA (KLOR-CON) 20 MEQ tablet Take 1 tablet (20 mEq total) by mouth 2 (two) times daily.   sertraline (ZOLOFT) 100 MG tablet Take 100 mg by mouth 2 (two) times daily.   torsemide (DEMADEX) 20 MG tablet Take 2 tablets (40 mg total) by mouth 2 (two) times daily.   traZODone (DESYREL) 100 MG tablet Take 1 tablet (100 mg total) by mouth at bedtime.   vitamin B-12 (CYANOCOBALAMIN) 1000 MCG tablet  Take 1,000 mcg by mouth daily.   vitamin E 180 MG (400 UNITS) capsule Take 400 Units by mouth daily.   No facility-administered encounter medications on file as of 12/11/2020.  Questions and concerns were addressed. Provided general support and encouragement, no other unmet needs identified  Thank you for the opportunity to participate in the care of Ms. Luz.  The palliative care team will continue to follow. Please call our office at [phone] if we can be of additional assistance.   This chart was dictated using voice recognition software.  Despite best efforts to proofread,  errors can occur which can change the documentation meaning.   Luciann Gossett Z Blessings Inglett, NP ,

## 2020-12-28 ENCOUNTER — Other Ambulatory Visit: Payer: Self-pay

## 2020-12-28 ENCOUNTER — Encounter: Payer: Self-pay | Admitting: Nurse Practitioner

## 2020-12-28 ENCOUNTER — Non-Acute Institutional Stay: Payer: Medicare Other | Admitting: Nurse Practitioner

## 2020-12-28 VITALS — BP 119/64 | HR 63 | Temp 97.7°F | Resp 20 | Wt 174.1 lb

## 2020-12-28 DIAGNOSIS — Z515 Encounter for palliative care: Secondary | ICD-10-CM

## 2020-12-28 DIAGNOSIS — I5032 Chronic diastolic (congestive) heart failure: Secondary | ICD-10-CM

## 2020-12-28 DIAGNOSIS — R0602 Shortness of breath: Secondary | ICD-10-CM

## 2020-12-28 NOTE — Progress Notes (Addendum)
[facility] Palliative Care Consult Note Telephone: [phone]  Fax: [phone]    Date of encounter: 12/28/20 4:16 PM PATIENT NAME: Kristi [name] [address] [place] [address]   [phone] (home)  DOB: 1940/05/27 MRN: [id number] PRIMARY CARE PROVIDER:   Dr [facility], [place],  [place] [address] [phone] RESPONSIBLE PARTY:    Contact Information     Name Relation Home Work [place] Daughter [phone]    Ophelia Shoulder Daughter [phone]  [phone]      I met face to face with patient in facility. Palliative Care was asked to follow this patient by consultation request of  Dr Nicole Kindred to address advance care planning and complex medical decision making. This is a follow up visit.                                  ASSESSMENT AND PLAN / RECOMMENDATIONS: Symptom Management/Plan: 1. Advance Care Planning; Full code, full scope    2. Shortness of breath improved secondary to CAD, dCHF, continue torsemide, currently off supplemental O2, monitor for edema, respiratory status. Reviewed and continue current plan;  Current weight 174.1 lbs with loss 7.8 lbs with BMI 33.   3. Palliative care encounter; Palliative care encounter; Palliative medicine team will continue to support patient, patient's family, and medical team. Visit consisted of counseling and education dealing with the complex and emotionally intense issues of symptom management and palliative care in the setting of serious and potentially life-threatening illness   4. f/u 8 weeks for ongoing monitoring chronic disease progression, ongoing discussions complex medical decision making  Follow up Palliative Care Visit: Palliative care will continue to follow for complex medical decision making, advance care planning, and clarification of goals. Return 8 weeks or prn.  I spent 38  minutes providing this consultation starting at 2:30pm. More than 50% of the time in this consultation was spent in counseling and care coordination. PPS: 40%  Chief Complaint: Follow up palliative consult visit for complex medical decision making  HISTORY OF PRESENT ILLNESS:  Kristi [name] is a 80 y.o. year old female  with multiple medical history significant of hypertension, hyperlipidemia, diabetes mellitus, stroke, GERD, hypothyroidism, depression with anxiety, pacemaker placement, Parkinson's disease, CAD, myocardial infarction, uterine cancer, dCHF, atrial fibrillation not on anticoagulants, chronic pain syndrome, GI bleeding, dementia, iron deficiency anemia. Kristi [name] resides at SNF at [facility]. Kristi [name] does require assistance with transfers as she is able to sit in w/c with supplemental O2. Kristi [name] does require assistance with bathing, dressing as she gets fatigue easily. Staff endorses Kristi [name] does feed herself with fair appetite depending on what is being served. Current weight 174.1 lbs with loss 7.8 lbs with BMI 33. Kristi [name] is a full code. I visited and observed Kristi [name]. At present Kristi [name] is sitting in the w/c appears comfortable, not wearing O2. No visitors present. Kristi [name] and I talked about pc visit, Kristi [name] in agreement. We talked about how she has been feeling. Kristi [name] endorses she has been doing better. Kristi [name] endorses she has been very sick over the last week. We talked about symptoms, ros currently negative. Weight loss likely due to fluid. We talked about appetite which varies but starting to improve. We talked about residing facility. Discussed role pc in poc. Medical  goals reviewed. Most pc visit today supportive. I have reached out to Kristi [name] daughter for further discussion of pc visit. I updated staff.   History obtained from review of EMR, discussion with facility staff and Kristi [name].  I reviewed available labs,  medications, imaging, studies and related documents from the EMR.  Records reviewed and summarized above.   ROS Full 10 system review of systems performed and negative with exception of: as per HPI.   Physical Exam: Constitutional: NAD General: frail appearing, pleasant female EYES: lids intact ENMT: oral mucous membranes moist CV: S1S2, RRR Pulmonary: LCTA, no increased work of breathing, no cough, room air Abdomen: normo-active BS + 4 quadrants, soft and non tender, MSK: q/c Skin: warm and dry, no rashes or wounds on visible skin Neuro:  + generalized weakness,  no cognitive impairment Psych: non-anxious affect, A and O x 3  Questions and concerns were addressed. The patient/family was encouraged to call with questions and/or concerns. My business card was provided. Provided general support and encouragement, no other unmet needs identified   Thank you for the opportunity to participate in the care of Kristi [name].  The palliative care team will continue to follow. Please call our office at [phone] if we can be of additional assistance.   This chart was dictated using voice recognition software.  Despite best efforts to proofread,  errors can occur which can change the documentation meaning.   Danice Dippolito Ihor Gully, NP

## 2021-02-14 ENCOUNTER — Emergency Department: Payer: Medicare Other

## 2021-02-14 ENCOUNTER — Emergency Department
Admission: EM | Admit: 2021-02-14 | Discharge: 2021-02-14 | Disposition: A | Discharge status: Skilled Nursing Facility | Payer: Medicare Other | Attending: Emergency Medicine | Admitting: Emergency Medicine

## 2021-02-14 ENCOUNTER — Encounter: Payer: Self-pay | Admitting: Emergency Medicine

## 2021-02-14 ENCOUNTER — Other Ambulatory Visit: Payer: Self-pay

## 2021-02-14 DIAGNOSIS — W050XXA Fall from non-moving wheelchair, initial encounter: Secondary | ICD-10-CM | POA: Insufficient documentation

## 2021-02-14 DIAGNOSIS — Z7901 Long term (current) use of anticoagulants: Secondary | ICD-10-CM | POA: Diagnosis not present

## 2021-02-14 DIAGNOSIS — E039 Hypothyroidism, unspecified: Secondary | ICD-10-CM | POA: Insufficient documentation

## 2021-02-14 DIAGNOSIS — N1832 Chronic kidney disease, stage 3b: Secondary | ICD-10-CM | POA: Insufficient documentation

## 2021-02-14 DIAGNOSIS — S0990XA Unspecified injury of head, initial encounter: Secondary | ICD-10-CM | POA: Diagnosis present

## 2021-02-14 DIAGNOSIS — S3992XA Unspecified injury of lower back, initial encounter: Secondary | ICD-10-CM | POA: Insufficient documentation

## 2021-02-14 DIAGNOSIS — G20 Parkinson's disease: Secondary | ICD-10-CM | POA: Insufficient documentation

## 2021-02-14 DIAGNOSIS — E1122 Type 2 diabetes mellitus with diabetic chronic kidney disease: Secondary | ICD-10-CM | POA: Diagnosis not present

## 2021-02-14 DIAGNOSIS — Z79899 Other long term (current) drug therapy: Secondary | ICD-10-CM | POA: Insufficient documentation

## 2021-02-14 DIAGNOSIS — F0280 Dementia in other diseases classified elsewhere without behavioral disturbance: Secondary | ICD-10-CM | POA: Insufficient documentation

## 2021-02-14 DIAGNOSIS — W19XXXA Unspecified fall, initial encounter: Secondary | ICD-10-CM

## 2021-02-14 DIAGNOSIS — I5032 Chronic diastolic (congestive) heart failure: Secondary | ICD-10-CM | POA: Diagnosis not present

## 2021-02-14 DIAGNOSIS — I130 Hypertensive heart and chronic kidney disease with heart failure and stage 1 through stage 4 chronic kidney disease, or unspecified chronic kidney disease: Secondary | ICD-10-CM | POA: Diagnosis not present

## 2021-02-14 DIAGNOSIS — Z8541 Personal history of malignant neoplasm of cervix uteri: Secondary | ICD-10-CM | POA: Diagnosis not present

## 2021-02-14 DIAGNOSIS — I2510 Atherosclerotic heart disease of native coronary artery without angina pectoris: Secondary | ICD-10-CM | POA: Insufficient documentation

## 2021-02-14 DIAGNOSIS — Z950 Presence of cardiac pacemaker: Secondary | ICD-10-CM | POA: Insufficient documentation

## 2021-02-14 DIAGNOSIS — Z951 Presence of aortocoronary bypass graft: Secondary | ICD-10-CM | POA: Diagnosis not present

## 2021-02-14 DIAGNOSIS — Z85048 Personal history of other malignant neoplasm of rectum, rectosigmoid junction, and anus: Secondary | ICD-10-CM | POA: Diagnosis not present

## 2021-02-14 LAB — BASIC METABOLIC PANEL
Anion gap: 7 (ref 5–15)
BUN: 28 mg/dL — ABNORMAL HIGH (ref 8–23)
CO2: 31 mmol/L (ref 22–32)
Calcium: 9.6 mg/dL (ref 8.9–10.3)
Chloride: 100 mmol/L (ref 98–111)
Creatinine, Ser: 1.37 mg/dL — ABNORMAL HIGH (ref 0.44–1.00)
GFR, Estimated: 39 mL/min — ABNORMAL LOW (ref >60)
Glucose, Bld: 131 mg/dL — ABNORMAL HIGH (ref 70–99)
Potassium: 3.6 mmol/L (ref 3.5–5.1)
Sodium: 138 mmol/L (ref 135–145)

## 2021-02-14 LAB — CBC
HCT: 34.8 % — ABNORMAL LOW (ref 36.0–46.0)
Hemoglobin: 11.3 g/dL — ABNORMAL LOW (ref 12.0–15.0)
MCH: 30.3 pg (ref 26.0–34.0)
MCHC: 32.5 g/dL (ref 30.0–36.0)
MCV: 93.3 fL (ref 80.0–100.0)
Platelets: 180 10*3/uL (ref 150–400)
RBC: 3.73 MIL/uL — ABNORMAL LOW (ref 3.87–5.11)
RDW: 17.2 % — ABNORMAL HIGH (ref 11.5–15.5)
WBC: 4.7 10*3/uL (ref 4.0–10.5)
nRBC: 0 % (ref 0.0–0.2)

## 2021-02-14 MED ORDER — SODIUM CHLORIDE 0.9 % IV BOLUS
1000.0000 mL | Freq: Once | INTRAVENOUS | Status: AC
  Administered 2021-02-14: 14:00:00 1000 mL via INTRAVENOUS

## 2021-02-14 NOTE — ED Notes (Signed)
Dc ppw provided. Pt denies any questions. Report attempted to call at [facility]. Pt placed on stretcher and assisted off unit denies any questions.

## 2021-02-14 NOTE — Discharge Instructions (Signed)
Please follow-up with your primary care provider for symptoms of concern.  Return to the emergency department for symptoms that change or worsen if you are unable to schedule an appointment.

## 2021-02-14 NOTE — ED Notes (Signed)
Attempted to call [facility] for patient report.No answer.

## 2021-02-14 NOTE — ED Triage Notes (Signed)
Pt via EMS from [facility]. Pt is here after an unwitnessed fall. Per EMS, pt did hit her head but denies LOC. Pt is on blood thinners. EMS states pupils are pinpoint but reactive, not narcotics use noted.   VSS per EMS. Pt denies pain. Pt has a hx of dementia. Pt is alert and oriented to self and situation which is baseline.

## 2021-02-14 NOTE — ED Provider Notes (Signed)
[facility] Emergency Department Provider Note ____________________________________________   Event Date/Time   First MD Initiated Contact with Patient 02/14/21 1014     (approximate)  I have reviewed the triage vital signs and the nursing notes.   HISTORY  Chief Complaint Fall  HPI Kristi [name] is a 80 y.o. female with history of as listed below presents to the emergency department for treatment and evaluation after slipping from her wheelchair into the floor and that falling backward striking her head.  Patient states that she had plan to go to the restroom and brought her wheelchair close to her bed but thinks that she forgot to lock it which caused her to slip off the wheelchair and into the floor.  She denies loss of consciousness but states that she was unable to get herself out of the floor.  She states that she was there for an hour or so before any of the nursing staff made rounds and then helped her up.  She complains of pain in her low back.  No alleviating measures attempted prior to arrival..         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   Cervical disc disorder    Depression    Diabetes mellitus without complication (HCC)    GERD (gastroesophageal reflux disease)    Headache    Hyperlipidemia    Hypertension    Hypothyroid    Myocardial infarction ([place])    15 year ago   Neck pain 06/04/2014   Parkinson's disease ([place])    Presence of permanent cardiac pacemaker    Spinal stenosis     Patient Active Problem List   Diagnosis Date Noted   UTI (urinary tract infection) 06/14/2020   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   Atrial fibrillation, chronic ([place]) 06/14/2020   Acute renal 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[place]) 06/14/2020    Lactic acidosis 06/14/2020   Diarrhea    Myoclonus    Weakness    AKI (acute kidney injury) ([place]) 05/16/2020   Transaminitis 05/16/2020   GI bleed 01/28/2020   Rectal bleed 01/28/2020   At risk for polypharmacy 01/28/2020   Acute delirium    HCAP (healthcare-associated pneumonia)    Hypotension    Hyponatremia    Lobar pneumonia ([place]) 10/23/2019   Atrial flutter ([place])    Dizziness    Atrial fibrillation status post cardioversion ([place]) 09/27/2019   CHF (congestive heart failure) ([place]) 09/26/2019   Chronic diastolic CHF (congestive heart failure) ([place]) 09/25/2019   Hypertensive urgency 09/25/2019   Fluid overload 09/25/2019   Excessive gas 05/14/2019   Generalized bloating 05/14/2019   Bright red blood per rectum 05/14/2019   Major depressive disorder, 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   Chest pain 11/20/2018   Difficulty walking 11/19/2018   Insomnia 11/15/2018   Type 2 diabetes mellitus with hypoglycemia without coma, with long-term current use of insulin ([place]) 11/15/2018   Goals of care, counseling/discussion 02/06/2018   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   Rectal mass 01/05/2018   Leg pain, bilateral 12/06/2017   Lymphedema 07/10/2017   Atherosclerosis of native arteries of the extremities with ulceration ([place]) 07/10/2017  Stricture and stenosis of esophagus    Dysphagia    Syncope and collapse 04/11/2017   Mild dementia 01/24/2017   Lumbar facet osteoarthritis (Bilateral) 01/17/2017   Acute postoperative pain 01/17/2017   Hypokalemia 12/24/2016   Anxiety 12/07/2016   Cardiac pacemaker in situ 12/07/2016   Cerebrovascular accident ([place]) 12/07/2016   Chronic depression 12/07/2016   Essential hypertension 12/07/2016   Hypothyroidism 12/07/2016   Mixed hyperlipidemia 12/07/2016   Myocardial infarction ([place]) 12/07/2016   TIA (transient ischemic attack) 11/17/2016   Parkinson disease ([place]) 10/27/2016   Tremor 10/27/2016   Chronic knee  pain (B) (R>L) 10/19/2016   Ischemic chest pain ([place]) 09/29/2016   Abnormal CT scan, lumbar spine 09/08/2016   Lumbar foraminal stenosis (multilevel) 09/08/2016   Fracture of clavicle 08/03/2016   Polyneuropathy 05/17/2016   Neurogenic pain 04/12/2016   Chronic lower extremity pain (Bilateral) (R>L) 04/12/2016   Chronic lower extremity radicular pain (Primary Source of Pain) (Bilateral) (R>L) 04/12/2016   Lower extremity weakness 04/12/2016   Chronic low back pain (Secondary source of pain) (Bilateral) (R>L) 04/12/2016   Chronic wrist pain (Left) (secondary to fracture) 04/12/2016   Lumbar spondylosis 04/12/2016   Chronic pain syndrome 04/11/2016   Long term current use of opiate analgesic 04/11/2016   Long term prescription opiate use 04/11/2016   Opiate use 04/11/2016   Long term prescription benzodiazepine use 04/11/2016   Lumbar radiculopathy (Multilevel) (Bilateral) 05/07/2015   Lumbar facet syndrome (Bilateral) (R>L) 08/21/2014   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, L3-4, L4-5) 08/13/2014   Sacroiliac joint dysfunction (Bilateral) 08/13/2014   Frequent falls 06/04/2014   Memory loss 06/04/2014   Anxiety and depression 08/22/2013   CAD (coronary artery disease) 08/22/2013   Therapeutic opioid-induced constipation (OIC) 08/22/2013   GERD (gastroesophageal reflux disease) 08/22/2013    Past Surgical History:  Procedure Laterality Date   ABDOMINAL HYSTERECTOMY     partial   BREAST SURGERY     CARDIOVERSION N/A 10/24/2019   Procedure: CARDIOVERSION;  Surgeon: Wellington Hampshire, MD;  Location: [facility] ORS;  Service: Cardiovascular;  Laterality: N/A;   COLONOSCOPY     COLONOSCOPY N/A 01/30/2020   Procedure: COLONOSCOPY;  Surgeon: Toledo, Benay Pike, MD;  Location: [facility] ENDOSCOPY;  Service: Gastroenterology;  Laterality: N/A;   COLONOSCOPY WITH PROPOFOL N/A 11/29/2017   Procedure: COLONOSCOPY WITH PROPOFOL;  Surgeon: [name], MD;  Location: [facility] ENDOSCOPY;  Service:  Endoscopy;  Laterality: N/A;   CORONARY ANGIOPLASTY     stent   CORONARY ARTERY BYPASS GRAFT     CORONARY STENT PLACEMENT     ESOPHAGOGASTRODUODENOSCOPY (EGD) WITH PROPOFOL N/A 04/18/2017   Procedure: ESOPHAGOGASTRODUODENOSCOPY (EGD) WITH PROPOFOL;  Surgeon: [name], MD;  Location: [facility] ENDOSCOPY;  Service: Endoscopy;  Laterality: N/A;   ESOPHAGOGASTRODUODENOSCOPY (EGD) WITH PROPOFOL N/A 11/29/2017   Procedure: ESOPHAGOGASTRODUODENOSCOPY (EGD) WITH PROPOFOL;  Surgeon: [name], MD;  Location: [facility] ENDOSCOPY;  Service: Endoscopy;  Laterality: N/A;   FLEXIBLE SIGMOIDOSCOPY N/A 05/17/2020   Procedure: FLEXIBLE SIGMOIDOSCOPY;  Surgeon: Lin Landsman, MD;  Location: [facility] ENDOSCOPY;  Service: Gastroenterology;  Laterality: N/A;   INSERT / REPLACE / REMOVE PACEMAKER     PPM GENERATOR CHANGEOUT N/A 09/05/2019   Procedure: PPM GENERATOR CHANGEOUT;  Surgeon: [name], MD;  Location: [place] CV LAB;  Service: Cardiovascular;  Laterality: N/A;   s/p pacer insertion     TRANSANAL EXCISION OF RECTAL MASS N/A 01/17/2018   Procedure: TRANSANAL EXCISION OF RECTAL POLYP;  Surgeon: [name], MD;  Location: [facility] ORS;  Service: General;  Laterality: N/A;    Prior to Admission medications   Medication Sig Start Date End Date Taking? Authorizing Provider  acetaminophen (TYLENOL) 325 MG tablet Take 2 tablets (650 mg total) by mouth every 6 (six) hours as needed for mild pain (or Fever >/= 101). 05/21/20   Loletha Grayer, MD  albuterol (VENTOLIN HFA) 108 (90 Base) MCG/ACT inhaler Inhale 2 puffs into the lungs every 6 (six) hours as needed for wheezing or shortness of breath. Patient not taking: No sig reported 10/28/19   Loletha Grayer, MD  amiodarone (PACERONE) 100 MG tablet Take 1 tablet (100 mg total) by mouth daily. 05/21/20   Loletha Grayer, MD  apixaban (ELIQUIS) 5 MG TABS tablet Take 1 tablet (5 mg total) by mouth 2 (two) times daily. 06/19/20   Mariel Aloe, MD   cholecalciferol (VITAMIN D3) 25 MCG (1000 UNIT) tablet Take 1,000 Units by mouth daily.    [provider]  cholestyramine (QUESTRAN) 4 g packet Take 1 packet (4 g total) by mouth daily. in water 10/28/19   Loletha Grayer, MD  donepezil (ARICEPT) 10 MG tablet Take 10 mg by mouth daily.     [provider]  Ferrous Sulfate (SLOW FE) 142 (45 Fe) MG TBCR Take 1 tablet by mouth twice daily 11/19/19   [name], MD  gabapentin (NEURONTIN) 600 MG tablet Take 1 tablet (600 mg total) by mouth daily. 06/20/20   Mariel Aloe, MD  hydrocortisone (ANUSOL-HC) 25 MG suppository Place 1 suppository (25 mg total) rectally 2 (two) times daily as needed for hemorrhoids or anal itching. Okay to substitute generic 05/21/20   Loletha Grayer, MD  hydrocortisone 2.5 % cream Apply 1 application topically 2 (two) times daily. 05/11/20   [provider]  hydrOXYzine (ATARAX/VISTARIL) 25 MG tablet Take 25 mg by mouth 2 (two) times daily. 01/27/20   [provider]  levothyroxine (SYNTHROID) 125 MCG tablet Take 125 mcg by mouth daily.  12/25/19   [provider]  linaclotide (LINZESS) 72 MCG capsule Take 72 mcg by mouth daily before breakfast.    [provider]  linagliptin (TRADJENTA) 5 MG TABS tablet Take 1 tablet (5 mg total) by mouth daily. 05/21/20   Loletha Grayer, MD  loperamide (IMODIUM) 2 MG capsule Take 1 capsule (2 mg total) by mouth as needed for diarrhea or loose stools. 06/19/20   Mariel Aloe, MD  loratadine (CLARITIN) 10 MG tablet Take 10 mg by mouth daily.     [provider]  meclizine (ANTIVERT) 12.5 MG tablet Take 1 tablet (12.5 mg total) by mouth 2 (two) times daily as needed for dizziness. 09/30/19   Jennye Boroughs, MD  memantine (NAMENDA) 5 MG tablet Take 5 mg by mouth at bedtime. 05/04/20   [provider]  metoprolol succinate (TOPROL XL) 25 MG 24 hr tablet Take 0.5 tablets (12.5 mg total) by mouth daily. 06/19/20 06/19/21  Mariel Aloe, MD  montelukast (SINGULAIR) 10 MG tablet Take 10 mg by mouth at bedtime.  07/23/19   [provider]  Multiple Vitamin (MULTIVITAMIN WITH MINERALS) TABS tablet Take 1 tablet by mouth daily.    [provider]  nitroGLYCERIN (NITROSTAT) 0.4 MG SL tablet Place 0.4 mg under the tongue every 5 (five) minutes as needed for chest pain.     [provider]  omeprazole (PRILOSEC) 40 MG capsule Take 40 mg by mouth 2 (two) times daily. 10/22/18   [provider]  potassium chloride SA (KLOR-CON) 20 MEQ tablet Take 1 tablet (20 mEq total) by mouth 2 (two) times daily. 05/21/20   Loletha Grayer, MD  sertraline (ZOLOFT) 100 MG tablet Take 100 mg by mouth 2 (two) times daily. 11/19/18 04/24/30  [provider]  torsemide (DEMADEX) 20 MG tablet Take 2 tablets (40 mg total) by mouth 2 (two) times daily. 04/24/20   Kate Sable, MD  traZODone (DESYREL) 100 MG tablet Take 1 tablet (100 mg total) by mouth at bedtime. 10/28/19   Loletha Grayer, MD  vitamin B-12 (CYANOCOBALAMIN) 1000 MCG tablet Take 1,000 mcg by mouth daily.    [provider]  vitamin E 180 MG (400 UNITS) capsule Take 400 Units by mouth daily.    [provider]    Allergies Penicillins  Family History  Problem Relation Age of Onset   Cancer Mother    Depression Mother    Diabetes Mother    Hypertension Mother    Aneurysm Mother    Heart disease Father    Diabetes Sister    Diabetes Brother    Diabetes Brother     Social History Social History   Tobacco Use   Smoking status: Never   Smokeless tobacco: Never  Vaping Use   Vaping Use: Never used  Substance Use Topics   Alcohol use: No   Drug use: No    Review of Systems  Constitutional: No fever/chills Eyes: No visual changes. ENT: No sore throat. Cardiovascular: Denies chest pain. Respiratory: Denies shortness of breath. Gastrointestinal: No abdominal pain.  No nausea, no vomiting.  No diarrhea.  No  constipation. Genitourinary: Negative for dysuria. Musculoskeletal: Positive for back pain. Skin: Negative for rash. Neurological: Negative for headaches, focal weakness or numbness  ____________________________________________   PHYSICAL EXAM:  VITAL SIGNS: ED Triage Vitals  Enc Vitals Group     BP 02/14/21 0715 (!) 145/107     Pulse Rate 02/14/21 0715 72     Resp 02/14/21 0715 20     Temp 02/14/21 0715 (!) 97.4 F (36.3 C)     Temp src --      SpO2 02/14/21 0715 93 %     Weight 02/14/21 0716 180 lb (81.6 kg)     Height 02/14/21 0716 5\' 7"  (1.702 m)     Head Circumference --      Peak Flow --      Pain Score --      Pain Loc --      Pain Edu? --      Excl. in [place]? --     Constitutional: Alert and oriented.  Overall well appearing and in no acute distress. Eyes: Conjunctivae are normal. PERRL. EOMI. Head: No laceration, abrasion, or hematoma noted Nose: No congestion/rhinnorhea.  No epistaxis or evidence thereof Mouth/Throat: Mucous membranes are moist.  Oropharynx non-erythematous. Neck: No stridor.   Hematological/Lymphatic/Immunilogical: No cervical lymphadenopathy. Cardiovascular: Normal rate, regular rhythm. Grossly normal heart sounds.  Good peripheral circulation. Respiratory: Normal respiratory effort.  No retractions. Lungs CTAB. Gastrointestinal: Soft and nontender. No distention. No abdominal bruits. Genitourinary:  Musculoskeletal: Generalized tenderness over the lower lumbar. Neurologic:  Normal speech and language. No gross focal neurologic deficits are appreciated. No gait instability. Skin:  Skin is warm, dry and intact. No rash noted. Psychiatric: Mood and affect are normal. Speech and behavior are normal.  ____________________________________________   LABS (all labs ordered are listed, but only abnormal results are displayed)  Labs Reviewed  BASIC METABOLIC PANEL -  Abnormal; Notable for the following components:      Result Value   Glucose, Bld  131 (*)    BUN 28 (*)    Creatinine, Ser 1.37 (*)    GFR, Estimated 39 (*)    All other components within normal limits  CBC - Abnormal; Notable for the following components:   RBC 3.73 (*)    Hemoglobin 11.3 (*)    HCT 34.8 (*)    RDW 17.2 (*)    All other components within normal limits  URINALYSIS, ROUTINE W REFLEX MICROSCOPIC  CBG MONITORING, ED   ____________________________________________  EKG  Not indicated ____________________________________________  RADIOLOGY  ED MD interpretation:    Image of the lumbar spine shows some suspicion of an L3 superior endplate fracture which is new since 2022.  CT of the head negative for acute concerns.  CT of the lumbar spine shows no evidence of an acute compression fracture or other acute concerns.  I, Sherrie George, personally viewed and evaluated these images (plain radiographs) as part of my medical decision making, as well as reviewing the written report by the radiologist.  Official radiology report(s): DG Lumbar Spine 2-3 Views  Result Date: 02/14/2021 CLINICAL DATA:  Back pain after fall last night EXAM: LUMBAR SPINE - 2-3 VIEW COMPARISON:  Abdominal CT 06/14/2020 FINDINGS: Levoscoliosis. Exaggerated lumbar lordosis with L4-5 anterolisthesis. The L2 body appears compressed along the left superior aspect, especially when comparing prior CT coronal reformats in the frontal projection. Generalized disc space narrowing with endplate spurring greatest at L1-2 and L2-3. Facet osteoarthritis particularly advanced at L4-5, with anterolisthesis. IMPRESSION: 1. Suspect L3 superior endplate fracture since a March 2022 abdominal CT. 2. Generalized spinal degeneration with scoliosis. Electronically Signed   By: Jorje Guild M.D.   On: 02/14/2021 11:26   CT HEAD WO CONTRAST  Result Date: 02/14/2021 CLINICAL DATA:  80 year old female with history of head trauma. Fall out of bed. EXAM: CT HEAD WITHOUT CONTRAST TECHNIQUE: Contiguous  axial images were obtained from the base of the skull through the vertex without intravenous contrast. COMPARISON:  Head CT 05/15/2020. FINDINGS: Brain: Mild cerebral and cerebellar atrophy. Patchy and confluent areas of decreased attenuation are noted throughout the deep and periventricular white matter of the cerebral hemispheres bilaterally, compatible with chronic microvascular ischemic disease. No evidence of acute infarction, hemorrhage, hydrocephalus, extra-axial collection or mass lesion/mass effect. Vascular: No hyperdense vessel or unexpected calcification. Skull: Normal. Negative for fracture or focal lesion. Sinuses/Orbits: Multifocal mucosal thickening in the ethmoid sinuses bilaterally, and trace volume of fluid lying dependently in the right maxillary sinus. Other: None. IMPRESSION: 1. No acute intracranial abnormalities. 2. Mild cerebral and cerebellar atrophy with chronic microvascular ischemic changes in the cerebral white matter, as above. 3. Paranasal sinus disease, as above. Electronically Signed   By: Vinnie Langton M.D.   On: 02/14/2021 08:12   CT Lumbar Spine Wo Contrast  Result Date: 02/14/2021 CLINICAL DATA:  Compression fracture, lumbar spine. EXAM: CT LUMBAR SPINE WITHOUT CONTRAST TECHNIQUE: Multidetector CT imaging of the lumbar spine was performed without intravenous contrast administration. Multiplanar CT image reconstructions were also generated. COMPARISON:  Radiographs 02/14/2021. Abdominal CT 06/14/2020 and lumbar spine CT 04/29/2016. FINDINGS: Segmentation: There are 5 lumbar type vertebral bodies. Alignment: There is a scoliosis convex the left at L3-4, a grade 1 retrolisthesis at L1-2 and a grade 1 anterolisthesis at L4-5. The alignment is unchanged from the prior lumbar spine CT. Vertebrae: No evidence of acute fracture or traumatic subluxation. There are  chronic endplate degenerative changes at L1-2 asymmetric to the left. Chronic Schmorl's node involving the superior  endplate of L3. Advanced facet degenerative changes inferiorly. Paraspinal and other soft tissues: No evidence of paraspinal hematoma or fluid collection. Diffuse aortic and branch vessel atherosclerosis. Disc levels: L1-2: Chronic degenerative disc disease with loss of disc height, annular disc bulging and endplate osteophytes asymmetric to the left. Stable chronic left foraminal narrowing. L2-3: Chronic loss of disc height with annular disc bulging and bilateral facet hypertrophy. Grade 1 retrolisthesis contributes to moderate spinal stenosis with stable asymmetric right lateral recess and right foraminal narrowing. L3-4: Stable chronic spondylosis with loss of disc height, annular disc bulging and endplate osteophytes. Moderate facet and ligamentous hypertrophy. Resulting moderate spinal stenosis with mild lateral recess and foraminal narrowing bilaterally. L4-5: Severe multifactorial spinal stenosis secondary to annular disc bulging, facet and ligamentous hypertrophy and a resulting grade 1 anterolisthesis. There is asymmetric right lateral recess narrowing. The foramina appear only mildly narrowed. L5-S1: Disc height is preserved. Advanced bilateral facet hypertrophy. No spinal stenosis or nerve root encroachment. IMPRESSION: 1. No evidence of acute lumbar spine fracture. 2. Stable chronic endplate degenerative changes at L1-2 and chronic Schmorl's node involving the superior endplate of L3. 3. Multilevel spondylosis associated with a scoliosis. There is resulting multilevel spinal stenosis, severe at L4-5. The spondylosis appears similar to previous studies. 4.  Aortic Atherosclerosis (ICD10-I70.0). Electronically Signed   By: Richardean Sale M.D.   On: 02/14/2021 13:17    ____________________________________________   PROCEDURES  Procedure(s) performed (including Critical Care):  Procedures  ____________________________________________   INITIAL IMPRESSION / ASSESSMENT AND PLAN      80 year old female presenting to the emergency department for treatment and evaluation after a mechanical, nonsyncopal fall from her wheelchair that occurred earlier this morning.  See HPI for further details.  Plan will be to get a head CT since she did fall backward and strike her head and DG image of the lumbar spine.  We will also review labs drawn while awaiting ER room assignment. DIFFERENTIAL DIAGNOSIS  Intracranial pathology, compression fracture lumbar  ED COURSE  Labs are overall reassuring.  Her BUN and creatinine appear to be stable at 28 and 1.3.  There is no leukocytosis.  CT of the head is without acute concerns.  There is some question of an endplate fracture at L3.  We will get a CT of the lumbar spine.  CT of the lumbar spine shows no evidence of acute fracture.  Plan will be to discharge the patient back to her care facility.  Results discussed with the patient.     As part of my medical decision making, I reviewed the following data within the [place] reviewed   ___________________________________________   FINAL CLINICAL IMPRESSION(S) / ED DIAGNOSES  Final diagnoses:  Fall, initial encounter  Minor head injury, initial encounter     ED Discharge Orders     None        Kristi [name] was evaluated in Emergency Department on 02/14/2021 for the symptoms described in the history of present illness. She was evaluated in the context of the global COVID-19 pandemic, which necessitated consideration that the patient might be at risk for infection with the SARS-CoV-2 virus that causes COVID-19. Institutional protocols and algorithms that pertain to the evaluation of patients at risk for COVID-19 are in a state of rapid change based on information released by regulatory bodies including the CDC and federal and state organizations. These policies  and algorithms were followed during the patient's care in the ED.   Note:  This document was  prepared using Dragon voice recognition software and may include unintentional dictation errors.    Victorino Dike, FNP 02/14/21 1510    Naaman Plummer, MD 02/15/21 [phone]

## 2021-02-14 NOTE — ED Notes (Signed)
Daughter called to speak to pt and asked if her mother had eaten all day. Rn provided pt with [place] sandwich tray and some water. She is still waiting on EMS for transfer back to healthcare facility.

## 2021-02-16 ENCOUNTER — Inpatient Hospital Stay: Payer: 59 | Attending: Oncology | Admitting: Oncology

## 2021-02-16 ENCOUNTER — Encounter: Payer: Self-pay | Admitting: Oncology

## 2021-02-16 ENCOUNTER — Inpatient Hospital Stay: Payer: 59

## 2021-02-16 ENCOUNTER — Other Ambulatory Visit: Payer: Self-pay

## 2021-02-16 VITALS — BP 127/78 | HR 64 | Temp 97.2°F | Wt 178.4 lb

## 2021-02-16 DIAGNOSIS — C211 Malignant neoplasm of anal canal: Secondary | ICD-10-CM | POA: Diagnosis not present

## 2021-02-16 DIAGNOSIS — K6289 Other specified diseases of anus and rectum: Secondary | ICD-10-CM | POA: Insufficient documentation

## 2021-02-16 DIAGNOSIS — C210 Malignant neoplasm of anus, unspecified: Secondary | ICD-10-CM | POA: Insufficient documentation

## 2021-02-16 LAB — BASIC METABOLIC PANEL
Anion gap: 6 (ref 5–15)
BUN: 24 mg/dL — ABNORMAL HIGH (ref 8–23)
CO2: 32 mmol/L (ref 22–32)
Calcium: 8.6 mg/dL — ABNORMAL LOW (ref 8.9–10.3)
Chloride: 101 mmol/L (ref 98–111)
Creatinine, Ser: 1.36 mg/dL — ABNORMAL HIGH (ref 0.44–1.00)
GFR, Estimated: 39 mL/min — ABNORMAL LOW (ref >60)
Glucose, Bld: 135 mg/dL — ABNORMAL HIGH (ref 70–99)
Potassium: 3.5 mmol/L (ref 3.5–5.1)
Sodium: 139 mmol/L (ref 135–145)

## 2021-02-16 NOTE — Progress Notes (Signed)
Hematology/Oncology Follow up Note [phone] [phone]   Patient Care Team: [place], [place] as PCP - General Kate Sable, MD as PCP - Cardiology (Cardiology) Clent Jacks, RN as Registered Nurse  REFERRING PROVIDER: Dr.Byrnett CHIEF COMPLAINTS/REASON FOR VISIT:  Follow up for anal cancer  HISTORY OF PRESENTING ILLNESS:  Kristi [name] is a  80 y.o.  female with PMH listed below who was referred to me for evaluation of anal cancer.  Patient had upper endoscopy and colonoscopy done on 11/29/2017. Colonoscopy showed diverticulosis in the sigmoid colon.  15 mm polyp at anus. Patient was referred to surgery Dr. 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  HPV related changes and adjacent squamous epithelium. Margins negative for invasive carcinoma. Lymphovascular invasion not identified. Perineural invasion not identified. Pathological staging pT1 Nx's  Patient presents for discussion of pathology results, cancer diagnosis, and discussion of adjuvant chemotherapy treatments. She reports feeling mildly nauseated, no vomiting.  All She has intermittent loose stools, denies any rectal bleeding or pain.   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 Recent 2D echo April 11, 2017 showed LVEF 65%, mild regurgitation of aortic valve, mitral valve and tricuspid valve.  Normal pulmonary artery pressures. Lower extremities swelling, chronic, she uses pump, follows up with vascular surgeon.   #02/12/2018 CT chest abdomen pelvis showed no adenopathy or other evidence of metastatic disease in the abdomen or pelvis. Mild left supraclavicular lymphadenopathy.  Several small pulmonary nodules both lung.  Largest 6 mm.  Need repeat CT scan for follow-up. #02/20/2018 PET scan showed no hypermetabolic activity on left supraclavicular region:   #  completed definitive radiation treatments on 04/17/2018 for stage I anal cancer.  Chemotherapy was not offered due to patient's multiple comorbidity after discussion with patient's cardiologist.   INTERVAL HISTORY Kristi [name] is a 80 y.o. female who has above history reviewed by me today presents for follow up visit for management of Stage 1 anal cancer. Patient lost follow-up and was last seen in July 2020. Patient complained that she has experienced rectal pain, difficulty passing bowel movements recently.  Denies any blood in the stool. Patient is a poor historian. Medical records were reviewed.  She follows with also a care palliative care service.   Review of Systems  Constitutional:  Positive for fatigue. Negative for appetite change, chills and fever.  HENT:   Negative for hearing loss and voice change.   Eyes:  Negative for eye problems.  Respiratory:  Negative for chest tightness and cough.   Cardiovascular:  Negative for chest pain.  Gastrointestinal:  Positive for rectal pain. Negative for abdominal distention, abdominal pain and blood in stool.       Difficulty passing bowel movement  Endocrine: Negative for hot flashes.  Genitourinary:  Negative for difficulty urinating and frequency.   Musculoskeletal:  Negative for arthralgias.  Skin:  Negative for itching and rash.  Neurological:  Negative for extremity weakness and headaches.  Hematological:  Negative for adenopathy.  Psychiatric/Behavioral:  Negative for confusion.    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   Cervical disc disorder    Depression    Diabetes  mellitus without complication (HCC)    GERD (gastroesophageal reflux disease)    Headache    Hyperlipidemia    Hypertension    Hypothyroid    Myocardial infarction ([place])    15 year ago    Neck pain 06/04/2014   Parkinson's disease ([place])    Presence of permanent cardiac pacemaker    Spinal stenosis     SURGICAL HISTORY: Past Surgical History:  Procedure Laterality Date   ABDOMINAL HYSTERECTOMY     partial   BREAST SURGERY     CARDIOVERSION N/A 10/24/2019   Procedure: CARDIOVERSION;  Surgeon: Wellington Hampshire, MD;  Location: [facility] ORS;  Service: Cardiovascular;  Laterality: N/A;   COLONOSCOPY     COLONOSCOPY N/A 01/30/2020   Procedure: COLONOSCOPY;  Surgeon: Toledo, Benay Pike, MD;  Location: [facility] ENDOSCOPY;  Service: Gastroenterology;  Laterality: N/A;   COLONOSCOPY WITH PROPOFOL N/A 11/29/2017   Procedure: COLONOSCOPY WITH PROPOFOL;  Surgeon: [name], MD;  Location: [facility] ENDOSCOPY;  Service: Endoscopy;  Laterality: N/A;   CORONARY ANGIOPLASTY     stent   CORONARY ARTERY BYPASS GRAFT     CORONARY STENT PLACEMENT     ESOPHAGOGASTRODUODENOSCOPY (EGD) WITH PROPOFOL N/A 04/18/2017   Procedure: ESOPHAGOGASTRODUODENOSCOPY (EGD) WITH PROPOFOL;  Surgeon: [name], MD;  Location: [facility] ENDOSCOPY;  Service: Endoscopy;  Laterality: N/A;   ESOPHAGOGASTRODUODENOSCOPY (EGD) WITH PROPOFOL N/A 11/29/2017   Procedure: ESOPHAGOGASTRODUODENOSCOPY (EGD) WITH PROPOFOL;  Surgeon: [name], MD;  Location: [facility] ENDOSCOPY;  Service: Endoscopy;  Laterality: N/A;   FLEXIBLE SIGMOIDOSCOPY N/A 05/17/2020   Procedure: FLEXIBLE SIGMOIDOSCOPY;  Surgeon: Lin Landsman, MD;  Location: [facility] ENDOSCOPY;  Service: Gastroenterology;  Laterality: N/A;   INSERT / REPLACE / REMOVE PACEMAKER     PPM GENERATOR CHANGEOUT N/A 09/05/2019   Procedure: PPM GENERATOR CHANGEOUT;  Surgeon: [name], MD;  Location: [place] CV LAB;  Service: Cardiovascular;  Laterality: N/A;   s/p pacer insertion     TRANSANAL EXCISION OF RECTAL MASS N/A 01/17/2018   Procedure: TRANSANAL EXCISION OF RECTAL POLYP;  Surgeon: [name], MD;  Location: [facility] ORS;  Service: General;  Laterality: N/A;     SOCIAL HISTORY: Social History   Socioeconomic History   Marital status: Widowed    Spouse name: Not on file   Number of children: Not on file   Years of education: Not on file   Highest education level: Not on file  Occupational History   Occupation: retired  Tobacco Use   Smoking status: Never   Smokeless tobacco: Never  Vaping Use   Vaping Use: Never used  Substance and Sexual Activity   Alcohol use: No   Drug use: No   Sexual activity: Never  Other Topics Concern   Not on file  Social History Narrative   Not on file   Social Determinants of Health   Financial Resource Strain: Not on file  Food Insecurity: Not on file  Transportation Needs: Not on file  Physical Activity: Not on file  Stress: Not on file  Social Connections: Not on file  Intimate Partner Violence: Not on file    FAMILY HISTORY: Family History  Problem Relation Age of Onset   Cancer Mother    Depression Mother    Diabetes Mother    Hypertension Mother    Aneurysm Mother    Heart disease Father    Diabetes Sister    Diabetes Brother    Diabetes Brother     ALLERGIES:  is allergic  to penicillins.  MEDICATIONS:  Current Outpatient Medications  Medication Sig Dispense Refill   acetaminophen (TYLENOL) 325 MG tablet Take 2 tablets (650 mg total) by mouth every 6 (six) hours as needed for mild pain (or Fever >/= 101).     amiodarone (PACERONE) 100 MG tablet Take 1 tablet (100 mg total) by mouth daily. 30 tablet 0   apixaban (ELIQUIS) 5 MG TABS tablet Take 1 tablet (5 mg total) by mouth 2 (two) times daily.     cholecalciferol (VITAMIN D3) 25 MCG (1000 UNIT) tablet Take 1,000 Units by mouth daily.     cholestyramine (QUESTRAN) 4 g packet Take 1 packet (4 g total) by mouth daily. in water     donepezil (ARICEPT) 10 MG tablet Take 10 mg by mouth daily.      Ferrous Sulfate (SLOW FE) 142 (45 Fe) MG TBCR Take 1 tablet by mouth twice daily     gabapentin (NEURONTIN) 600 MG tablet Take 1 tablet  (600 mg total) by mouth daily.     hydrocortisone (ANUSOL-HC) 25 MG suppository Place 1 suppository (25 mg total) rectally 2 (two) times daily as needed for hemorrhoids or anal itching. Okay to substitute generic 12 suppository 0   hydrocortisone 2.5 % cream Apply 1 application topically 2 (two) times daily.     hydrOXYzine (ATARAX/VISTARIL) 25 MG tablet Take 25 mg by mouth 2 (two) times daily.     levothyroxine (SYNTHROID) 125 MCG tablet Take 125 mcg by mouth daily.      linaclotide (LINZESS) 72 MCG capsule Take 72 mcg by mouth daily before breakfast.     linagliptin (TRADJENTA) 5 MG TABS tablet Take 1 tablet (5 mg total) by mouth daily. 30 tablet 0   loperamide (IMODIUM) 2 MG capsule Take 1 capsule (2 mg total) by mouth as needed for diarrhea or loose stools.  0   loratadine (CLARITIN) 10 MG tablet Take 10 mg by mouth daily.      meclizine (ANTIVERT) 12.5 MG tablet Take 1 tablet (12.5 mg total) by mouth 2 (two) times daily as needed for dizziness. 30 tablet 0   memantine (NAMENDA) 5 MG tablet Take 5 mg by mouth at bedtime.     metoprolol succinate (TOPROL XL) 25 MG 24 hr tablet Take 0.5 tablets (12.5 mg total) by mouth daily.     montelukast (SINGULAIR) 10 MG tablet Take 10 mg by mouth at bedtime.      Multiple Vitamin (MULTIVITAMIN WITH MINERALS) TABS tablet Take 1 tablet by mouth daily.     nitroGLYCERIN (NITROSTAT) 0.4 MG SL tablet Place 0.4 mg under the tongue every 5 (five) minutes as needed for chest pain.      omeprazole (PRILOSEC) 40 MG capsule Take 40 mg by mouth 2 (two) times daily.     potassium chloride SA (KLOR-CON) 20 MEQ tablet Take 1 tablet (20 mEq total) by mouth 2 (two) times daily. 60 tablet 0   sertraline (ZOLOFT) 100 MG tablet Take 100 mg by mouth 2 (two) times daily.     torsemide (DEMADEX) 20 MG tablet Take 2 tablets (40 mg total) by mouth 2 (two) times daily.     traZODone (DESYREL) 100 MG tablet Take 1 tablet (100 mg total) by mouth at bedtime. 30 tablet 0   vitamin  B-12 (CYANOCOBALAMIN) 1000 MCG tablet Take 1,000 mcg by mouth daily.     vitamin E 180 MG (400 UNITS) capsule Take 400 Units by mouth daily.     albuterol (VENTOLIN HFA)  108 (90 Base) MCG/ACT inhaler Inhale 2 puffs into the lungs every 6 (six) hours as needed for wheezing or shortness of breath. (Patient not taking: Reported on 05/18/2020) 8 g 0   No current facility-administered medications for this visit.     PHYSICAL EXAMINATION: ECOG PERFORMANCE STATUS: 1 - Symptomatic but completely ambulatory Vitals:   02/16/21 1308  BP: 127/78  Pulse: 64  Temp: (!) 97.2 F (36.2 C)   Filed Weights   02/16/21 1308  Weight: 178 lb 6.4 oz (80.9 kg)    Physical Exam Constitutional:      General: She is not in acute distress.    Comments: Walk in  HENT:     Head: Normocephalic and atraumatic.  Eyes:     General: No scleral icterus.    Pupils: Pupils are equal, round, and reactive to light.  Cardiovascular:     Rate and Rhythm: Normal rate and regular rhythm.     Heart sounds: Normal heart sounds.  Pulmonary:     Effort: Pulmonary effort is normal. No respiratory distress.     Breath sounds: No wheezing.  Abdominal:     General: Bowel sounds are normal. There is no distension.     Palpations: Abdomen is soft. There is no mass.     Tenderness: There is no abdominal tenderness.  Musculoskeletal:        General: No deformity. Normal range of motion.     Cervical back: Normal range of motion and neck supple.     Comments:  Chronic varicose veins  Skin:    General: Skin is warm and dry.     Findings: No erythema or rash.  Neurological:     Mental Status: She is alert and oriented to person, place, and time.     Cranial Nerves: No cranial nerve deficit.     Coordination: Coordination normal.  Psychiatric:        Behavior: Behavior normal.        Thought Content: Thought content normal.       LABORATORY DATA:  I have reviewed the data as listed Lab Results  Component Value  Date   WBC 4.7 02/14/2021   HGB 11.3 (L) 02/14/2021   HCT 34.8 (L) 02/14/2021   MCV 93.3 02/14/2021   PLT 180 02/14/2021   Recent Labs    05/16/20 0809 05/17/20 0553 05/21/20 0452 06/05/20 1401 06/05/20 1549 06/14/20 0952 06/15/20 0425 06/18/20 0430 02/14/21 0719 02/16/21 1354  NA  --    < > 138   < >  --  134*   < > 140 138 139  K 3.1*   < > 3.2*   < >  --  2.2*   < > 3.6 3.6 3.5  CL  --    < > 93*   < >  --  85*   < > 102 100 101  CO2  --    < > 31   < >  --  33*   < > 29 31 32  GLUCOSE  --    < > 151*   < >  --  149*   < > 128* 131* 135*  BUN  --    < > 17   < >  --  43*   < > 10 28* 24*  CREATININE  --    < > 1.15*   < >  --  2.07*   < > 0.68 1.37* 1.36*  CALCIUM  --    < >  9.4   < >  --  9.1   < > 9.4 9.6 8.6*  GFRNONAA  --    < > 48*   < >  --  24*   < > >60 39* 39*  PROT 6.5   < > 7.4  --  7.5 6.9  --   --   --   --   ALBUMIN 3.3*   < > 3.6  --  3.4* 3.4*  --   --   --   --   AST 123*   < > 126*  --  67* 32  --   --   --   --   ALT 103*   < > 111*  --  99* 33  --   --   --   --   ALKPHOS 66   < > 76  --  94 79  --   --   --   --   BILITOT 0.6   < > 0.8  --  0.5 0.5  --   --   --   --   BILIDIR 0.2  --   --   --  <0.1  --   --   --   --   --   IBILI 0.4  --   --   --  NOT CALCULATED  --   --   --   --   --    < > = values in this interval not displayed.    Iron/TIBC/Ferritin/ %Sat    Component Value Date/Time   IRON 48 05/17/2020 1652   TIBC 349 05/17/2020 1652   FERRITIN 114 05/17/2020 1652   IRONPCTSAT 14 05/17/2020 1652     RADIOGRAPHIC STUDIES: I have personally reviewed the radiological images as listed and agreed with the findings in the report. 02/12/2018 CT chest abdomen pelvis with contrast showed no adenopathy or other evidence of metastatic disease in the abdomen or pelvis.  Mild left supraclavicular lymphadenopathy, obscured by streak artifact from left chest pacemaker. Several pulmonary nodules, indeterminate.  Nonspecific asymmetric mild right  anal wall thickening and hyperenhancement.  Postsurgical changes. 02/20/2018 No findings identified to suggest a residual/recurrent tumor or hypermetabolic metastasis.  There is no abnormal hypermetabolic is not identified within the left supraclavicular region.  Distal rectum and anus mild radiotracer uptake.  Nonspecific.  Aortic atherosclerosis.  Multivessel coronary artery atherosclerosis calcification.  Hiatal hernia.  ASSESSMENT & PLAN:  1. Cancer of anal canal ([place])   2. Rectal pain    pT1,cN0 M0. Status post definitive radiation treatment. Patient has lost follow-up. Recommend image surveillance. Patient used to have multiple small lung nodules. Check BMP today. Patient has GFR of 39, creatinine 1.36. Will obtain CT chest abdomen pelvis without contrast.  Rectal pain, Will refer patient to reestablish care with surgery for evaluation.  Orders Placed This Encounter  Procedures   Basic metabolic panel    Standing Status:   Future    Number of Occurrences:   1    Standing Expiration Date:   02/16/2022   Ambulatory referral to General Surgery    Referral Priority:   Routine    Referral Type:   Surgical    Referral Reason:   Specialty Services Required    Referred to Provider:   [name], MD    Requested Specialty:   General Surgery    Number of Visits Requested:   1    All questions were answered. The patient  knows to call the clinic with any problems questions or concerns.  Return of visit: To be determined.   Earlie Server, MD,  02/16/2021

## 2021-02-17 ENCOUNTER — Telehealth: Payer: Self-pay

## 2021-02-17 ENCOUNTER — Other Ambulatory Visit: Payer: Self-pay

## 2021-02-17 DIAGNOSIS — C211 Malignant neoplasm of anal canal: Secondary | ICD-10-CM

## 2021-02-17 NOTE — Telephone Encounter (Signed)
Staff message sent to schedule CT c/a/p  wo contrast.

## 2021-02-17 NOTE — Telephone Encounter (Signed)
-----   Message from Earlie Server, MD sent at 02/16/2021  9:52 PM EST ----- Please obtain CT chest abdomen pelvis without contrast for rectal pain, history of annal cancer.  Follow up 1 week after CT, MD only

## 2021-03-10 ENCOUNTER — Ambulatory Visit: Admission: RE | Admit: 2021-03-10 | Payer: 59 | Source: Ambulatory Visit

## 2021-03-12 ENCOUNTER — Other Ambulatory Visit: Payer: Self-pay | Admitting: General Surgery

## 2021-03-12 DIAGNOSIS — R151 Fecal smearing: Secondary | ICD-10-CM

## 2021-03-17 ENCOUNTER — Ambulatory Visit
Admission: RE | Admit: 2021-03-17 | Discharge: 2021-03-17 | Disposition: A | Discharge status: Home / Self Care | Payer: Medicare Other | Source: Ambulatory Visit | Attending: Oncology | Admitting: Oncology

## 2021-03-17 ENCOUNTER — Other Ambulatory Visit: Payer: Self-pay

## 2021-03-17 DIAGNOSIS — C211 Malignant neoplasm of anal canal: Secondary | ICD-10-CM | POA: Insufficient documentation

## 2021-03-17 MED ORDER — IOHEXOL 300 MG/ML  SOLN
75.0000 mL | Freq: Once | INTRAMUSCULAR | Status: AC | PRN
  Administered 2021-03-17: 14:00:00 75 mL via INTRAVENOUS

## 2021-03-18 ENCOUNTER — Inpatient Hospital Stay: Payer: Medicare Other | Attending: Oncology | Admitting: Oncology

## 2021-03-18 ENCOUNTER — Encounter: Payer: Self-pay | Admitting: Oncology

## 2021-03-18 VITALS — BP 113/62 | HR 59 | Temp 98.1°F | Resp 18 | Wt 166.8 lb

## 2021-03-18 DIAGNOSIS — C211 Malignant neoplasm of anal canal: Secondary | ICD-10-CM

## 2021-03-18 DIAGNOSIS — C210 Malignant neoplasm of anus, unspecified: Secondary | ICD-10-CM | POA: Diagnosis present

## 2021-03-18 NOTE — Progress Notes (Signed)
Hematology/Oncology Follow up Note [phone] [phone]   Patient Care Team: [place], [place] as PCP - General Kate Sable, MD as PCP - Cardiology (Cardiology) Clent Jacks, RN as Registered Nurse  REFERRING PROVIDER: Dr.Byrnett CHIEF COMPLAINTS/REASON FOR VISIT:  Follow up for anal cancer  HISTORY OF PRESENTING ILLNESS:  Kristi [name] is a  80 y.o.  female with PMH listed below who was referred to me for evaluation of anal cancer.  Patient had upper endoscopy and colonoscopy done on 11/29/2017. Colonoscopy showed diverticulosis in the sigmoid colon.  15 mm polyp at anus. Patient was referred to surgery Dr. 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  HPV related changes and adjacent squamous epithelium. Margins negative for invasive carcinoma. Lymphovascular invasion not identified. Perineural invasion not identified. Pathological staging pT1 Nx's  Patient presents for discussion of pathology results, cancer diagnosis, and discussion of adjuvant chemotherapy treatments. She reports feeling mildly nauseated, no vomiting.  All She has intermittent loose stools, denies any rectal bleeding or pain.   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 Recent 2D echo April 11, 2017 showed LVEF 65%, mild regurgitation of aortic valve, mitral valve and tricuspid valve.  Normal pulmonary artery pressures. Lower extremities swelling, chronic, she uses pump, follows up with vascular surgeon.   #02/12/2018 CT chest abdomen pelvis showed no adenopathy or other evidence of metastatic disease in the abdomen or pelvis. Mild left supraclavicular lymphadenopathy.  Several small pulmonary nodules both lung.  Largest 6 mm.  Need repeat CT scan for follow-up. #02/20/2018 PET scan showed no hypermetabolic activity on left supraclavicular region:   #  completed definitive radiation treatments on 04/17/2018 for stage I anal cancer.  Chemotherapy was not offered due to patient's multiple comorbidity after discussion with patient's cardiologist.  # Patient lost follow-up and was last seen in July 2020 and re-established care on 02/16/2021  Kristi [name] is a 80 y.o. female who has above history reviewed by me today presents for follow up visit for management of Stage 1 anal cancer. She was seen by Dr.Byrnett on 03/11/2021. 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   03/17/2021, CT chest abdomen pelvis with contrast showed a stable CT of chest abdomen pelvis.No findings to suggest residual or recurrent tumor or metastatic disease.  Small groundglass attenuating nodule stable.  Hiatal hernia.  Lumbar scoliosis and degenerative disc disease.  Aortic atherosclerosis.  Review of Systems  Constitutional:  Positive for fatigue. Negative for appetite change, chills and fever.  HENT:   Negative for hearing loss and voice change.   Eyes:  Negative for eye problems.  Respiratory:  Negative for chest tightness and cough.   Cardiovascular:  Negative for chest pain.  Gastrointestinal:  Positive for rectal pain. Negative for abdominal distention, abdominal pain and blood in stool.  Endocrine: Negative for hot flashes.  Genitourinary:  Negative for difficulty urinating and frequency.   Musculoskeletal:  Negative for arthralgias.  Skin:  Negative for itching and rash.  Neurological:  Negative for extremity weakness and headaches.  Hematological:  Negative for adenopathy.  Psychiatric/Behavioral:  Negative for confusion.    MEDICAL HISTORY:  Past Medical History:  Diagnosis Date   Anxiety    Arthritis    Broken arm    left FA 3/17   Broken arm  left   CAD (coronary artery disease)    s/p MI   Cancer  [facility])    uterine   Cervical disc disorder    Depression    Diabetes mellitus without complication (HCC)    GERD (gastroesophageal reflux disease)    Headache    Hyperlipidemia    Hypertension    Hypothyroid    Myocardial infarction ([place])    15 year ago   Neck pain 06/04/2014   Parkinson's disease ([place])    Presence of permanent cardiac pacemaker    Spinal stenosis     SURGICAL HISTORY: Past Surgical History:  Procedure Laterality Date   ABDOMINAL HYSTERECTOMY     partial   BREAST SURGERY     CARDIOVERSION N/A 10/24/2019   Procedure: CARDIOVERSION;  Surgeon: Wellington Hampshire, MD;  Location: [facility] ORS;  Service: Cardiovascular;  Laterality: N/A;   COLONOSCOPY     COLONOSCOPY N/A 01/30/2020   Procedure: COLONOSCOPY;  Surgeon: Toledo, Benay Pike, MD;  Location: [facility] ENDOSCOPY;  Service: Gastroenterology;  Laterality: N/A;   COLONOSCOPY WITH PROPOFOL N/A 11/29/2017   Procedure: COLONOSCOPY WITH PROPOFOL;  Surgeon: [name], MD;  Location: [facility] ENDOSCOPY;  Service: Endoscopy;  Laterality: N/A;   CORONARY ANGIOPLASTY     stent   CORONARY ARTERY BYPASS GRAFT     CORONARY STENT PLACEMENT     ESOPHAGOGASTRODUODENOSCOPY (EGD) WITH PROPOFOL N/A 04/18/2017   Procedure: ESOPHAGOGASTRODUODENOSCOPY (EGD) WITH PROPOFOL;  Surgeon: [name], MD;  Location: [facility] ENDOSCOPY;  Service: Endoscopy;  Laterality: N/A;   ESOPHAGOGASTRODUODENOSCOPY (EGD) WITH PROPOFOL N/A 11/29/2017   Procedure: ESOPHAGOGASTRODUODENOSCOPY (EGD) WITH PROPOFOL;  Surgeon: [name], MD;  Location: [facility] ENDOSCOPY;  Service: Endoscopy;  Laterality: N/A;   FLEXIBLE SIGMOIDOSCOPY N/A 05/17/2020   Procedure: FLEXIBLE SIGMOIDOSCOPY;  Surgeon: Lin Landsman, MD;  Location: [facility] ENDOSCOPY;  Service: Gastroenterology;  Laterality: N/A;   INSERT / REPLACE / REMOVE PACEMAKER     PPM GENERATOR CHANGEOUT N/A 09/05/2019   Procedure: PPM GENERATOR CHANGEOUT;  Surgeon: [name], MD;  Location: [place] CV LAB;   Service: Cardiovascular;  Laterality: N/A;   s/p pacer insertion     TRANSANAL EXCISION OF RECTAL MASS N/A 01/17/2018   Procedure: TRANSANAL EXCISION OF RECTAL POLYP;  Surgeon: [name], MD;  Location: [facility] ORS;  Service: General;  Laterality: N/A;    SOCIAL HISTORY: Social History   Socioeconomic History   Marital status: Widowed    Spouse name: Not on file   Number of children: Not on file   Years of education: Not on file   Highest education level: Not on file  Occupational History   Occupation: retired  Tobacco Use   Smoking status: Never   Smokeless tobacco: Never  Vaping Use   Vaping Use: Never used  Substance and Sexual Activity   Alcohol use: No   Drug use: No   Sexual activity: Never  Other Topics Concern   Not on file  Social History Narrative   Not on file   Social Determinants of Health   Financial Resource Strain: Not on file  Food Insecurity: Not on file  Transportation Needs: Not on file  Physical Activity: Not on file  Stress: Not on file  Social Connections: Not on file  Intimate Partner Violence: Not on file    FAMILY HISTORY: Family History  Problem Relation Age of Onset   Cancer Mother    Depression Mother    Diabetes Mother    Hypertension Mother  Aneurysm Mother    Heart disease Father    Diabetes Sister    Diabetes Brother    Diabetes Brother     ALLERGIES:  is allergic to penicillins.  MEDICATIONS:  Current Outpatient Medications  Medication Sig Dispense Refill   amiodarone (PACERONE) 100 MG tablet Take 1 tablet (100 mg total) by mouth daily. 30 tablet 0   apixaban (ELIQUIS) 5 MG TABS tablet Take 1 tablet (5 mg total) by mouth 2 (two) times daily.     busPIRone (BUSPAR) 5 MG tablet 2 (two) times daily     donepezil (ARICEPT) 10 MG tablet Take 10 mg by mouth daily.      Ferrous Sulfate (SLOW FE) 142 (45 Fe) MG TBCR Take 1 tablet by mouth twice daily     gabapentin (NEURONTIN) 600 MG tablet Take 1 tablet (600 mg  total) by mouth daily.     hydrOXYzine (ATARAX/VISTARIL) 25 MG tablet Take 25 mg by mouth 2 (two) times daily.     levothyroxine (SYNTHROID) 125 MCG tablet Take 125 mcg by mouth daily.      linagliptin (TRADJENTA) 5 MG TABS tablet Take 1 tablet (5 mg total) by mouth daily. 30 tablet 0   memantine (NAMENDA) 5 MG tablet Take 5 mg by mouth at bedtime.     metoprolol succinate (TOPROL XL) 25 MG 24 hr tablet Take 0.5 tablets (12.5 mg total) by mouth daily.     montelukast (SINGULAIR) 10 MG tablet Take 10 mg by mouth at bedtime.      nystatin cream (MYCOSTATIN) Apply 1 application topically 2 (two) times daily.     omeprazole (PRILOSEC) 40 MG capsule Take 40 mg by mouth 2 (two) times daily.     potassium chloride SA (KLOR-CON) 20 MEQ tablet Take 1 tablet (20 mEq total) by mouth 2 (two) times daily. 60 tablet 0   rOPINIRole (REQUIP XL) 4 MG 24 hr tablet Take 4 mg by mouth daily.     sertraline (ZOLOFT) 100 MG tablet Take 100 mg by mouth 2 (two) times daily.     sucralfate (CARAFATE) 1 GM/10ML suspension SMARTSIG:Milliliter(s) By Mouth     torsemide (DEMADEX) 20 MG tablet Take 2 tablets (40 mg total) by mouth 2 (two) times daily.     traZODone (DESYREL) 100 MG tablet Take 1 tablet (100 mg total) by mouth at bedtime. 30 tablet 0   acetaminophen (TYLENOL) 325 MG tablet Take 2 tablets (650 mg total) by mouth every 6 (six) hours as needed for mild pain (or Fever >/= 101). (Patient not taking: Reported on 03/18/2021)     albuterol (VENTOLIN HFA) 108 (90 Base) MCG/ACT inhaler Inhale 2 puffs into the lungs every 6 (six) hours as needed for wheezing or shortness of breath. (Patient not taking: Reported on 05/18/2020) 8 g 0   cholecalciferol (VITAMIN D3) 25 MCG (1000 UNIT) tablet Take 1,000 Units by mouth daily. (Patient not taking: Reported on 03/18/2021)     cholestyramine (QUESTRAN) 4 g packet Take 1 packet (4 g total) by mouth daily. in water (Patient not taking: Reported on 03/18/2021)     hydrocortisone  (ANUSOL-HC) 25 MG suppository Place 1 suppository (25 mg total) rectally 2 (two) times daily as needed for hemorrhoids or anal itching. Okay to substitute generic (Patient not taking: Reported on 03/18/2021) 12 suppository 0   hydrocortisone 2.5 % cream Apply 1 application topically 2 (two) times daily. (Patient not taking: Reported on 03/18/2021)     linaclotide (LINZESS) 72 MCG capsule Take  72 mcg by mouth daily before breakfast. (Patient not taking: Reported on 03/18/2021)     loperamide (IMODIUM) 2 MG capsule Take 1 capsule (2 mg total) by mouth as needed for diarrhea or loose stools. (Patient not taking: Reported on 03/18/2021)  0   loratadine (CLARITIN) 10 MG tablet Take 10 mg by mouth daily.  (Patient not taking: Reported on 03/18/2021)     meclizine (ANTIVERT) 12.5 MG tablet Take 1 tablet (12.5 mg total) by mouth 2 (two) times daily as needed for dizziness. (Patient not taking: Reported on 03/18/2021) 30 tablet 0   Multiple Vitamin (MULTIVITAMIN WITH MINERALS) TABS tablet Take 1 tablet by mouth daily. (Patient not taking: Reported on 03/18/2021)     nitroGLYCERIN (NITROSTAT) 0.4 MG SL tablet Place 0.4 mg under the tongue every 5 (five) minutes as needed for chest pain.  (Patient not taking: Reported on 03/18/2021)     vitamin B-12 (CYANOCOBALAMIN) 1000 MCG tablet Take 1,000 mcg by mouth daily. (Patient not taking: Reported on 03/18/2021)     vitamin E 180 MG (400 UNITS) capsule Take 400 Units by mouth daily. (Patient not taking: Reported on 03/18/2021)     No current facility-administered medications for this visit.     PHYSICAL EXAMINATION: ECOG PERFORMANCE STATUS: 1 - Symptomatic but completely ambulatory Vitals:   03/18/21 1307  BP: 113/62  Pulse: (!) 59  Resp: 18  Temp: 98.1 F (36.7 C)   Filed Weights   03/18/21 1307  Weight: 166 lb 12.8 oz (75.7 kg)    Physical Exam Constitutional:      General: She is not in acute distress.    Comments: Walk in  HENT:     Head:  Normocephalic and atraumatic.  Eyes:     General: No scleral icterus.    Pupils: Pupils are equal, round, and reactive to light.  Cardiovascular:     Rate and Rhythm: Normal rate and regular rhythm.     Heart sounds: Normal heart sounds.  Pulmonary:     Effort: Pulmonary effort is normal. No respiratory distress.     Breath sounds: No wheezing.  Abdominal:     General: Bowel sounds are normal. There is no distension.     Palpations: Abdomen is soft. There is no mass.     Tenderness: There is no abdominal tenderness.  Musculoskeletal:        General: No deformity. Normal range of motion.     Cervical back: Normal range of motion and neck supple.     Comments:  Chronic varicose veins  Skin:    General: Skin is warm and dry.     Findings: No erythema or rash.  Neurological:     Mental Status: She is alert and oriented to person, place, and time.     Cranial Nerves: No cranial nerve deficit.     Coordination: Coordination normal.  Psychiatric:        Behavior: Behavior normal.        Thought Content: Thought content normal.       LABORATORY DATA:  I have reviewed the data as listed Lab Results  Component Value Date   WBC 4.7 02/14/2021   HGB 11.3 (L) 02/14/2021   HCT 34.8 (L) 02/14/2021   MCV 93.3 02/14/2021   PLT 180 02/14/2021   Recent Labs    05/16/20 0809 05/17/20 0553 05/21/20 0452 06/05/20 1401 06/05/20 1549 06/14/20 0952 06/15/20 0425 06/18/20 0430 02/14/21 0719 02/16/21 1354  NA  --    < >  138   < >  --  134*   < > 140 138 139  K 3.1*   < > 3.2*   < >  --  2.2*   < > 3.6 3.6 3.5  CL  --    < > 93*   < >  --  85*   < > 102 100 101  CO2  --    < > 31   < >  --  33*   < > 29 31 32  GLUCOSE  --    < > 151*   < >  --  149*   < > 128* 131* 135*  BUN  --    < > 17   < >  --  43*   < > 10 28* 24*  CREATININE  --    < > 1.15*   < >  --  2.07*   < > 0.68 1.37* 1.36*  CALCIUM  --    < > 9.4   < >  --  9.1   < > 9.4 9.6 8.6*  GFRNONAA  --    < > 48*   < >  --   24*   < > >60 39* 39*  PROT 6.5   < > 7.4  --  7.5 6.9  --   --   --   --   ALBUMIN 3.3*   < > 3.6  --  3.4* 3.4*  --   --   --   --   AST 123*   < > 126*  --  67* 32  --   --   --   --   ALT 103*   < > 111*  --  99* 33  --   --   --   --   ALKPHOS 66   < > 76  --  94 79  --   --   --   --   BILITOT 0.6   < > 0.8  --  0.5 0.5  --   --   --   --   BILIDIR 0.2  --   --   --  <0.1  --   --   --   --   --   IBILI 0.4  --   --   --  NOT CALCULATED  --   --   --   --   --    < > = values in this interval not displayed.    Iron/TIBC/Ferritin/ %Sat    Component Value Date/Time   IRON 48 05/17/2020 1652   TIBC 349 05/17/2020 1652   FERRITIN 114 05/17/2020 1652   IRONPCTSAT 14 05/17/2020 1652     RADIOGRAPHIC STUDIES: I have personally reviewed the radiological images as listed and agreed with the findings in the report. 02/12/2018 CT chest abdomen pelvis with contrast showed no adenopathy or other evidence of metastatic disease in the abdomen or pelvis.  Mild left supraclavicular lymphadenopathy, obscured by streak artifact from left chest pacemaker. Several pulmonary nodules, indeterminate.  Nonspecific asymmetric mild right anal wall thickening and hyperenhancement.  Postsurgical changes. 02/20/2018 No findings identified to suggest a residual/recurrent tumor or hypermetabolic metastasis.  There is no abnormal hypermetabolic is not identified within the left supraclavicular region.  Distal rectum and anus mild radiotracer uptake.  Nonspecific.  Aortic atherosclerosis.  Multivessel coronary artery atherosclerosis calcification.  Hiatal hernia.  ASSESSMENT & PLAN:  1. Cancer of anal canal (  [place])    pT1,cN0 M0. Status post definitive radiation treatment. CT was reviewed and discussed with patient.  No signs of recurrence. I recommend patient to continue follow-up with surgery Dr. Bary Castilla every 6 months for anoscopy. She is 3 years after surgery.  Recommend CT chest abdomen pelvis with contrast  annually.    Orders Placed This Encounter  Procedures   CT CHEST ABDOMEN PELVIS W CONTRAST    Standing Status:   Future    Standing Expiration Date:   03/18/2022    Order Specific Question:   Preferred imaging location?    Answer:   [facility]    Order Specific Question:   Is Oral Contrast requested for this exam?    Answer:   Yes, Per Radiology protocol   Comprehensive metabolic panel    Standing Status:   Future    Standing Expiration Date:   03/18/2022   CBC with Differential/Platelet    Standing Status:   Future    Standing Expiration Date:   03/18/2022    All questions were answered. The patient knows to call the clinic with any problems questions or concerns.  Return of visit: T 1year  Earlie Server, MD,  03/18/2021

## 2021-04-12 ENCOUNTER — Encounter: Payer: Self-pay | Admitting: Nurse Practitioner

## 2021-04-12 ENCOUNTER — Non-Acute Institutional Stay: Payer: Medicare Other | Admitting: Nurse Practitioner

## 2021-04-12 ENCOUNTER — Other Ambulatory Visit: Payer: Self-pay

## 2021-04-12 VITALS — BP 99/64 | HR 67 | Temp 97.0°F | Resp 18 | Wt 172.0 lb

## 2021-04-12 DIAGNOSIS — R0602 Shortness of breath: Secondary | ICD-10-CM

## 2021-04-12 DIAGNOSIS — I5032 Chronic diastolic (congestive) heart failure: Secondary | ICD-10-CM

## 2021-04-12 DIAGNOSIS — Z515 Encounter for palliative care: Secondary | ICD-10-CM

## 2021-04-12 NOTE — Progress Notes (Signed)
[facility] Consult Note Telephone: [phone]  Fax: [phone]    Date of encounter: 04/12/21 9:15 PM PATIENT NAME: Kristi [name] [id number]   [phone] (home)  DOB: 12/29/1940 MRN: [id number] PRIMARY CARE PROVIDER:    [place]:    Contact Information     Name Relation Home Work Mobile   Mio Daughter [phone]    Ophelia Shoulder Daughter [phone]  [phone]      I met face to face with patient in facility. Palliative Care was asked to follow this patient by consultation request of  [facility] [facility] to address advance care planning and complex medical decision making. This is a follow up visit.                                  ASSESSMENT AND PLAN / RECOMMENDATIONS: Symptom Management/Plan: 1. Advance Care Planning; Full code, full scope    2. Shortness of breath improved secondary to CAD, dCHF, continue torsemide, currently off supplemental O2, monitor for edema, respiratory status. Reviewed and continue current plan;   02/23/2021 weight 172.1 lbs 03/24/2021 weight 172 lbs  3. Palliative care encounter; Palliative care encounter; Palliative medicine team will continue to support patient, patient's family, and medical team. Visit consisted of counseling and education dealing with the complex and emotionally intense issues of symptom management and palliative care in the setting of serious and potentially life-threatening illness   4. f/u 8 weeks for ongoing monitoring chronic disease progression, ongoing discussions complex medical decision making  Follow up Palliative Care Visit: Palliative care will continue to follow for complex medical decision making, advance care planning, and clarification of goals. Return 8 weeks or prn.  I spent 67 minutes providing this consultation. More than  50% of the time in this consultation was spent in counseling and care coordination.  PPS: 50%  Chief Complaint: Follow up palliative consult for complex medical decision making  HISTORY OF PRESENT ILLNESS:  Kristi [name] is a 81 y.o. year old female  with multiple medical history significant of hypertension, hyperlipidemia, diabetes mellitus, stroke, GERD, hypothyroidism, depression with anxiety, pacemaker placement, Parkinson's disease, CAD, myocardial infarction, uterine cancer, dCHF, atrial fibrillation not on anticoagulants, chronic pain syndrome, GI bleeding, dementia, iron deficiency anemia. Kristi [name] resides at SNF [facility]. Kristi [name] does require assistance with transfers as she is able to sit in w/c with supplemental O2. Kristi [name] does require assistance with bathing, dressing as she gets fatigue easily. Staff endorses Kristi [name] does feed herself with fair appetite. Kristi [name] is a full code. I visited and observed Kristi [name] in her room at [facility]. No visitors present. Kristi [name] and I talked about pc visit, Kristi [name] in agreement. We talked about how she has been feeling. Kristi [name] endorses she has a rectal prolapse she is dealing with which has been causing her discomfort, notified nursing for pain management per Ms. Purrington request. Ms. Gall endorses she has a pending appointment with GI. We talked about symptoms, ros currently negative. We talked about appetite, nutrition, foods she tolerates. We talked about residing facility. Discussed role pc in poc. Medical goals reviewed. We talked about quality of life. Most pc visit today supportive. I have reached out to Kristi [name] daughter for further discussion of pc visit. I updated staff, no  new recommendations today  History obtained from review of EMR, discussion with facility staff and  Kristi [name].  I reviewed available labs, medications, imaging, studies and related documents from the EMR.  Records reviewed  and summarized above.   ROS 10 point system reviewed with Ms. Tasker and facility staff all negative except HPI  Physical Exam: Constitutional: NAD General: pleasant female EYES: lids intact ENMT: oral mucous membranes moist CV: S1S2, RRR Pulmonary: LCTA, no increased work of breathing, no cough, room air Abdomen:  normo-active BS + 4 quadrants, soft and non tender MSK: w/c dependent Skin: warm and dry Neuro:  + generalized weakness,  + cognitive impairment Psych: non-anxious affect, A and O x 3  Questions and concerns were addressed. Provided general support and encouragement, no other unmet needs identified   Thank you for the opportunity to participate in the care of Kristi [name].  The palliative care team will continue to follow. Please call our office at [phone] if we can be of additional assistance.   This chart was dictated using voice recognition software.  Despite best efforts to proofread,  errors can occur which can change the documentation meaning.   Adyson Vanburen Ihor Gully, NP

## 2021-04-23 ENCOUNTER — Telehealth: Payer: Self-pay | Admitting: *Deleted

## 2021-04-23 NOTE — Telephone Encounter (Signed)
Kristi [name] called on behalf of the patient. She stated that the cancer center called and left msg with patient that an other apt was needed. Please call Kristi [name] at [phone] so that she can get this apt for the patient.

## 2021-04-23 NOTE — Telephone Encounter (Signed)
Contacted [facility], who states that patient has been having increased rectal pain, but she has already scheduled appt with PCP (no appt availability until April). Patient last seen DEC and per note: "CT was reviewed and discussed with patient.  No signs of recurrence. I recommend patient to continue follow-up with surgery Dr. Bary Castilla every 6 months for anoscopy. She is 3 years after surgery.  Recommend CT chest abdomen pelvis with contrast annually."

## 2021-04-26 ENCOUNTER — Encounter: Payer: Self-pay | Admitting: Nurse Practitioner

## 2021-04-27 NOTE — Telephone Encounter (Signed)
Dr. Tasia Catchings contacted Dr. Bary Castilla regarding pt concerns and per Dr. Bary Castilla:   When the patient was seen in December a referral was sent to physical therapy at the hospital.  I do not see that this ever was completed.  I will have my staff contact the daughter and let her know I can send a prescription for some Anusol HC suppositories with hydrocortisone.  She will likely need a good Rx card or the app on her phone to get a price that is reasonable.  The patient has an appointment with Harless Litten, NP mid April.  She has previously seen Dr. Marius Ditch for an endoscopy in February 2022.  I do not think I have anything to add at this time.

## 2021-05-13 ENCOUNTER — Telehealth: Payer: Self-pay

## 2021-05-13 NOTE — Telephone Encounter (Signed)
Scheduled for 08/02/2021 °

## 2021-06-08 ENCOUNTER — Emergency Department
Admission: EM | Admit: 2021-06-08 | Discharge: 2021-06-08 | Disposition: A | Discharge status: Home / Self Care | Payer: Medicare Other | Attending: Emergency Medicine | Admitting: Emergency Medicine

## 2021-06-08 ENCOUNTER — Encounter: Payer: Self-pay | Admitting: Emergency Medicine

## 2021-06-08 ENCOUNTER — Emergency Department: Payer: Medicare Other

## 2021-06-08 DIAGNOSIS — E1122 Type 2 diabetes mellitus with diabetic chronic kidney disease: Secondary | ICD-10-CM | POA: Diagnosis not present

## 2021-06-08 DIAGNOSIS — I130 Hypertensive heart and chronic kidney disease with heart failure and stage 1 through stage 4 chronic kidney disease, or unspecified chronic kidney disease: Secondary | ICD-10-CM | POA: Diagnosis not present

## 2021-06-08 DIAGNOSIS — G20 Parkinson's disease: Secondary | ICD-10-CM | POA: Insufficient documentation

## 2021-06-08 DIAGNOSIS — Z8541 Personal history of malignant neoplasm of cervix uteri: Secondary | ICD-10-CM | POA: Insufficient documentation

## 2021-06-08 DIAGNOSIS — K6289 Other specified diseases of anus and rectum: Secondary | ICD-10-CM | POA: Insufficient documentation

## 2021-06-08 DIAGNOSIS — I5032 Chronic diastolic (congestive) heart failure: Secondary | ICD-10-CM | POA: Diagnosis not present

## 2021-06-08 DIAGNOSIS — Z79899 Other long term (current) drug therapy: Secondary | ICD-10-CM | POA: Insufficient documentation

## 2021-06-08 DIAGNOSIS — F0390 Unspecified dementia without behavioral disturbance: Secondary | ICD-10-CM | POA: Insufficient documentation

## 2021-06-08 DIAGNOSIS — Z20822 Contact with and (suspected) exposure to covid-19: Secondary | ICD-10-CM | POA: Insufficient documentation

## 2021-06-08 DIAGNOSIS — Z7901 Long term (current) use of anticoagulants: Secondary | ICD-10-CM | POA: Diagnosis not present

## 2021-06-08 DIAGNOSIS — N1832 Chronic kidney disease, stage 3b: Secondary | ICD-10-CM | POA: Diagnosis not present

## 2021-06-08 DIAGNOSIS — I4891 Unspecified atrial fibrillation: Secondary | ICD-10-CM | POA: Diagnosis not present

## 2021-06-08 DIAGNOSIS — Z951 Presence of aortocoronary bypass graft: Secondary | ICD-10-CM | POA: Diagnosis not present

## 2021-06-08 DIAGNOSIS — Z7984 Long term (current) use of oral hypoglycemic drugs: Secondary | ICD-10-CM | POA: Insufficient documentation

## 2021-06-08 DIAGNOSIS — N390 Urinary tract infection, site not specified: Secondary | ICD-10-CM | POA: Diagnosis not present

## 2021-06-08 DIAGNOSIS — E039 Hypothyroidism, unspecified: Secondary | ICD-10-CM | POA: Insufficient documentation

## 2021-06-08 DIAGNOSIS — Z950 Presence of cardiac pacemaker: Secondary | ICD-10-CM | POA: Insufficient documentation

## 2021-06-08 DIAGNOSIS — R4182 Altered mental status, unspecified: Secondary | ICD-10-CM | POA: Insufficient documentation

## 2021-06-08 DIAGNOSIS — I2510 Atherosclerotic heart disease of native coronary artery without angina pectoris: Secondary | ICD-10-CM | POA: Insufficient documentation

## 2021-06-08 LAB — URINALYSIS, ROUTINE W REFLEX MICROSCOPIC
Bilirubin Urine: NEGATIVE
Glucose, UA: NEGATIVE mg/dL
Ketones, ur: NEGATIVE mg/dL
Nitrite: NEGATIVE
Protein, ur: NEGATIVE mg/dL
Specific Gravity, Urine: 1.006 (ref 1.005–1.030)
WBC, UA: >50 WBC/hpf — ABNORMAL HIGH (ref 0–5)
pH: 5 (ref 5.0–8.0)

## 2021-06-08 LAB — CBC WITH DIFFERENTIAL/PLATELET
Abs Immature Granulocytes: 0.01 10*3/uL (ref 0.00–0.07)
Basophils Absolute: 0 10*3/uL (ref 0.0–0.1)
Basophils Relative: 0 %
Eosinophils Absolute: 0.2 10*3/uL (ref 0.0–0.5)
Eosinophils Relative: 3 %
HCT: 36.9 % (ref 36.0–46.0)
Hemoglobin: 11.5 g/dL — ABNORMAL LOW (ref 12.0–15.0)
Immature Granulocytes: 0 %
Lymphocytes Relative: 29 %
Lymphs Abs: 2.1 10*3/uL (ref 0.7–4.0)
MCH: 28.6 pg (ref 26.0–34.0)
MCHC: 31.2 g/dL (ref 30.0–36.0)
MCV: 91.8 fL (ref 80.0–100.0)
Monocytes Absolute: 0.4 10*3/uL (ref 0.1–1.0)
Monocytes Relative: 6 %
Neutro Abs: 4.5 10*3/uL (ref 1.7–7.7)
Neutrophils Relative %: 62 %
Platelets: 231 10*3/uL (ref 150–400)
RBC: 4.02 MIL/uL (ref 3.87–5.11)
RDW: 15.5 % (ref 11.5–15.5)
WBC: 7.2 10*3/uL (ref 4.0–10.5)
nRBC: 0 % (ref 0.0–0.2)

## 2021-06-08 LAB — COMPREHENSIVE METABOLIC PANEL
ALT: 9 U/L (ref 0–44)
AST: 26 U/L (ref 15–41)
Albumin: 4.1 g/dL (ref 3.5–5.0)
Alkaline Phosphatase: 81 U/L (ref 38–126)
Anion gap: 12 (ref 5–15)
BUN: 18 mg/dL (ref 8–23)
CO2: 30 mmol/L (ref 22–32)
Calcium: 9.6 mg/dL (ref 8.9–10.3)
Chloride: 95 mmol/L — ABNORMAL LOW (ref 98–111)
Creatinine, Ser: 1.28 mg/dL — ABNORMAL HIGH (ref 0.44–1.00)
GFR, Estimated: 42 mL/min — ABNORMAL LOW (ref >60)
Glucose, Bld: 111 mg/dL — ABNORMAL HIGH (ref 70–99)
Potassium: 3.6 mmol/L (ref 3.5–5.1)
Sodium: 137 mmol/L (ref 135–145)
Total Bilirubin: 0.7 mg/dL (ref 0.3–1.2)
Total Protein: 8.2 g/dL — ABNORMAL HIGH (ref 6.5–8.1)

## 2021-06-08 LAB — RESP PANEL BY RT-PCR (FLU A&B, COVID) ARPGX2
Influenza A by PCR: NEGATIVE
Influenza B by PCR: NEGATIVE
SARS Coronavirus 2 by RT PCR: NEGATIVE

## 2021-06-08 LAB — LACTIC ACID, PLASMA: Lactic Acid, Venous: 1.3 mmol/L (ref 0.5–1.9)

## 2021-06-08 LAB — TROPONIN I (HIGH SENSITIVITY): Troponin I (High Sensitivity): 8 ng/L (ref <18)

## 2021-06-08 MED ORDER — HYDROCORT-PRAMOXINE (PERIANAL) 1-1 % EX FOAM: 1.0000 | Freq: Three times a day (TID) | CUTANEOUS | 0 refills | Status: DC

## 2021-06-08 MED ORDER — CEPHALEXIN 250 MG PO CAPS: 250.0000 mg | ORAL_CAPSULE | Freq: Three times a day (TID) | ORAL | 0 refills | Status: DC

## 2021-06-08 MED ORDER — HYDROCORT-PRAMOXINE (PERIANAL) 1-1 % EX FOAM
1.0000 | Freq: Two times a day (BID) | CUTANEOUS | Status: DC
  Filled 2021-06-08: qty 10

## 2021-06-08 MED ORDER — SODIUM CHLORIDE 0.9 % IV SOLN
1.0000 g | Freq: Once | INTRAVENOUS | Status: AC
  Administered 2021-06-08: 1 g via INTRAVENOUS
  Filled 2021-06-08: qty 10

## 2021-06-08 NOTE — Discharge Instructions (Signed)
1.  Take antibiotic as prescribed (Keflex 250 mg 3 times daily x7 days). ?2.  You may apply Proctofoam as needed for rectal pain. ?3.  Return to the ER for worsening symptoms, persistent vomiting, difficulty breathing or other concerns. ?

## 2021-06-08 NOTE — ED Provider Notes (Signed)
[facility] Provider Note    Event Date/Time   First MD Initiated Contact with Patient 06/08/21 0229     (approximate)   History   Altered Mental Status   HPI  Kristi [name] is a 81 y.o. female brought to the ED via EMS from SNF with a chief complaint of unresponsiveness.  Nursing home staff told EMS patient was unresponsive for a brief period of time even to painful stimuli.  Nursing home staff reported patient had a blood pressure in the 80s.  EMS reports patient has been alert and awake, normotensive since their arrival and angry that she was awakened from sleep to be transported to the hospital.  Patient did say she had to strain at a bowel movement before bedtime.  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  Other than that she denies any complaints.  Specifically, denies recent fever, cough, chest pain, shortness of breath, abdominal pain, nausea or vomiting.     Past Medical History   Past Medical History:  Diagnosis Date   Anxiety    Arthritis    Broken arm    left FA 3/17   Broken arm    left   CAD (coronary artery disease)    s/p MI   Cancer (HCC)    uterine   Cervical disc disorder    Depression    Diabetes mellitus without complication (HCC)    GERD (gastroesophageal reflux disease)    Headache    Hyperlipidemia    Hypertension    Hypothyroid    Myocardial infarction (HCC)    15 year ago   Neck pain 06/04/2014   Parkinson's disease (HCC)    Presence of permanent cardiac pacemaker    Spinal stenosis      Active Problem List   Patient Active Problem List   Diagnosis Date Noted   Rectal pain 02/16/2021   UTI (urinary tract infection) 06/14/2020   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   Atrial fibrillation, chronic (HCC) 06/14/2020   Acute renal failure superimposed on stage 3b  chronic kidney disease (HCC) 06/14/2020   Lactic acidosis 06/14/2020   Diarrhea    Myoclonus    Weakness    AKI (acute kidney injury) (HCC) 05/16/2020   Transaminitis 05/16/2020   GI bleed 01/28/2020   Rectal bleed 01/28/2020   At risk for polypharmacy 01/28/2020   Acute delirium    HCAP (healthcare-associated pneumonia)    Hypotension    Hyponatremia    Lobar pneumonia (HCC) 10/23/2019   Atrial flutter (HCC)    Dizziness    Atrial fibrillation status post cardioversion (HCC) 09/27/2019   CHF (congestive heart failure) (HCC) 09/26/2019   Chronic diastolic CHF (congestive heart failure) (HCC) 09/25/2019   Hypertensive urgency 09/25/2019   Fluid overload 09/25/2019   Excessive gas 05/14/2019   Generalized bloating 05/14/2019   Bright red blood per rectum 05/14/2019   Major depressive disorder, 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   Chest pain 11/20/2018   Difficulty walking 11/19/2018   Insomnia 11/15/2018   Type 2 diabetes mellitus with hypoglycemia without coma, with long-term current use of insulin (HCC) 11/15/2018   Goals of care, counseling/discussion 02/06/2018   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   Rectal mass 01/05/2018   Leg pain, bilateral 12/06/2017   Lymphedema 07/10/2017   Atherosclerosis of native arteries of the extremities with  ulceration (HCC) 07/10/2017   Stricture and stenosis of esophagus    Dysphagia    Syncope and collapse 04/11/2017   Mild dementia 01/24/2017   Lumbar facet osteoarthritis (Bilateral) 01/17/2017   Acute postoperative pain 01/17/2017   Hypokalemia 12/24/2016   Anxiety 12/07/2016   Cardiac pacemaker in situ 12/07/2016   Cerebrovascular accident (HCC) 12/07/2016   Chronic depression 12/07/2016   Essential hypertension 12/07/2016   Hypothyroidism 12/07/2016   Mixed hyperlipidemia 12/07/2016   Myocardial infarction (HCC) 12/07/2016   TIA (transient ischemic attack) 11/17/2016   Parkinson disease (HCC)  10/27/2016   Tremor 10/27/2016   Chronic knee pain (B) (R>L) 10/19/2016   Ischemic chest pain (HCC) 09/29/2016   Abnormal CT scan, lumbar spine 09/08/2016   Lumbar foraminal stenosis (multilevel) 09/08/2016   Fracture of clavicle 08/03/2016   Polyneuropathy 05/17/2016   Neurogenic pain 04/12/2016   Chronic lower extremity pain (Bilateral) (R>L) 04/12/2016   Chronic lower extremity radicular pain (Primary Source of Pain) (Bilateral) (R>L) 04/12/2016   Lower extremity weakness 04/12/2016   Chronic low back pain (Secondary source of pain) (Bilateral) (R>L) 04/12/2016   Chronic wrist pain (Left) (secondary to fracture) 04/12/2016   Lumbar spondylosis 04/12/2016   Chronic pain syndrome 04/11/2016   Long term current use of opiate analgesic 04/11/2016   Long term prescription opiate use 04/11/2016   Opiate use 04/11/2016   Long term prescription benzodiazepine use 04/11/2016   Lumbar radiculopathy (Multilevel) (Bilateral) 05/07/2015   Lumbar facet syndrome (Bilateral) (R>L) 08/21/2014   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, L3-4, L4-5) 08/13/2014   Sacroiliac joint dysfunction (Bilateral) 08/13/2014   Frequent falls 06/04/2014   Memory loss 06/04/2014   Anxiety and depression 08/22/2013   CAD (coronary artery disease) 08/22/2013   Therapeutic opioid-induced constipation (OIC) 08/22/2013   GERD (gastroesophageal reflux disease) 08/22/2013     Past Surgical History   Past Surgical History:  Procedure Laterality Date   ABDOMINAL HYSTERECTOMY     partial   BREAST SURGERY     CARDIOVERSION N/A 10/24/2019   Procedure: CARDIOVERSION;  Surgeon: Iran Ouch, MD;  Location: [facility] ORS;  Service: Cardiovascular;  Laterality: N/A;   COLONOSCOPY     COLONOSCOPY N/A 01/30/2020   Procedure: COLONOSCOPY;  Surgeon: Toledo, Boykin Nearing, MD;  Location: [facility] ENDOSCOPY;  Service: Gastroenterology;  Laterality: N/A;   COLONOSCOPY WITH PROPOFOL N/A 11/29/2017   Procedure: COLONOSCOPY WITH  PROPOFOL;  Surgeon: Scot Jun, MD;  Location[facility] ENDOSCOPY;  Service: Endoscopy;  Laterality: N/A;   CORONARY ANGIOPLASTY     stent   CORONARY ARTERY BYPASS GRAFT     CORONARY STENT PLACEMENT     ESOPHAGOGASTRODUODENOSCOPY (EGD) WITH PROPOFOL N/A 04/18/2017   Procedure: ESOPHAGOGASTRODUODENOSCOPY (EGD) WITH PROPOFOL;  Surgeon: Midge Minium, MD;  Location: [facility] ENDOSCOPY;  Service: Endoscopy;  Laterality: N/A;   ESOPHAGOGASTRODUODENOSCOPY (EGD) WITH PROPOFOL N/A 11/29/2017   Procedure: ESOPHAGOGASTRODUODENOSCOPY (EGD) WITH PROPOFOL;  Surgeon: Scot Jun, MD;  Location: [facility] ENDOSCOPY;  Service: Endoscopy;  Laterality: N/A;   FLEXIBLE SIGMOIDOSCOPY N/A 05/17/2020   Procedure: FLEXIBLE SIGMOIDOSCOPY;  Surgeon: Toney Reil, MD;  Location: [facility] ENDOSCOPY;  Service: Gastroenterology;  Laterality: N/A;   INSERT / REPLACE / REMOVE PACEMAKER     PPM GENERATOR CHANGEOUT N/A 09/05/2019   Procedure: PPM GENERATOR CHANGEOUT;  Surgeon: Duke Salvia, MD;  Location: [facility] INVASIVE CV LAB;  Service: Cardiovascular;  Laterality: N/A;   s/p pacer insertion     TRANSANAL EXCISION OF RECTAL MASS N/A 01/17/2018   Procedure:  TRANSANAL EXCISION OF RECTAL POLYP;  Surgeon: Earline Mayotte, MD;  Location: [facility] ORS;  Service: General;  Laterality: N/A;     Home Medications   Prior to Admission medications   Medication Sig Start Date End Date Taking? Authorizing Provider  cephALEXin (KEFLEX) 250 MG capsule Take 1 capsule (250 mg total) by mouth 3 (three) times daily. 06/08/21  Yes Irean Hong, MD  hydrocortisone-pramoxine [facility]) rectal foam Place 1 applicator rectally 3 (three) times daily. 06/08/21  Yes Irean Hong, MD  acetaminophen (TYLENOL) 325 MG tablet Take 2 tablets (650 mg total) by mouth every 6 (six) hours as needed for mild pain (or Fever >/= 101). Patient not taking: Reported on 03/18/2021 05/21/20   Alford Highland, MD  albuterol (VENTOLIN HFA) 108 (90 Base) MCG/ACT  inhaler Inhale 2 puffs into the lungs every 6 (six) hours as needed for wheezing or shortness of breath. Patient not taking: Reported on 05/18/2020 10/28/19   Alford Highland, MD  amiodarone (PACERONE) 100 MG tablet Take 1 tablet (100 mg total) by mouth daily. 05/21/20   Alford Highland, MD  apixaban (ELIQUIS) 5 MG TABS tablet Take 1 tablet (5 mg total) by mouth 2 (two) times daily. 06/19/20   Narda Bonds, MD  busPIRone (BUSPAR) 5 MG tablet 2 (two) times daily 03/05/21   [provider]  cholecalciferol (VITAMIN D3) 25 MCG (1000 UNIT) tablet Take 1,000 Units by mouth daily. Patient not taking: Reported on 03/18/2021    [provider]  cholestyramine (QUESTRAN) 4 g packet Take 1 packet (4 g total) by mouth daily. in water Patient not taking: Reported on 03/18/2021 10/28/19   Alford Highland, MD  donepezil (ARICEPT) 10 MG tablet Take 10 mg by mouth daily.     [provider]  Ferrous Sulfate (SLOW FE) 142 (45 Fe) MG TBCR Take 1 tablet by mouth twice daily 11/19/19   Duke Salvia, MD  gabapentin (NEURONTIN) 600 MG tablet Take 1 tablet (600 mg total) by mouth daily. 06/20/20   Narda Bonds, MD  hydrocortisone (ANUSOL-HC) 25 MG suppository Place 1 suppository (25 mg total) rectally 2 (two) times daily as needed for hemorrhoids or anal itching. Okay to substitute generic Patient not taking: Reported on 03/18/2021 05/21/20   Alford Highland, MD  hydrocortisone 2.5 % cream Apply 1 application topically 2 (two) times daily. Patient not taking: Reported on 03/18/2021 05/11/20   [provider]  hydrOXYzine (ATARAX/VISTARIL) 25 MG tablet Take 25 mg by mouth 2 (two) times daily. 01/27/20   [provider]  levothyroxine (SYNTHROID) 125 MCG tablet Take 125 mcg by mouth daily.  12/25/19   [provider]  linaclotide Karlene Einstein) 72 MCG capsule Take 72 mcg by mouth daily before breakfast. Patient not taking: Reported on 03/18/2021    [provider]   linagliptin (TRADJENTA) 5 MG TABS tablet Take 1 tablet (5 mg total) by mouth daily. 05/21/20   Alford Highland, MD  loperamide (IMODIUM) 2 MG capsule Take 1 capsule (2 mg total) by mouth as needed for diarrhea or loose stools. Patient not taking: Reported on 03/18/2021 06/19/20   Narda Bonds, MD  loratadine (CLARITIN) 10 MG tablet Take 10 mg by mouth daily.  Patient not taking: Reported on 03/18/2021    [provider]  meclizine (ANTIVERT) 12.5 MG tablet Take 1 tablet (12.5 mg total) by mouth 2 (two) times daily as needed for dizziness. Patient not taking: Reported on 03/18/2021 09/30/19   Lurene Shadow, MD  memantine (NAMENDA) 5 MG tablet Take 5 mg by mouth at bedtime. 05/04/20   [provider]  metoprolol succinate (TOPROL XL) 25 MG 24 hr tablet Take 0.5 tablets (12.5 mg total) by mouth daily. 06/19/20 06/19/21  Narda Bonds, MD  montelukast (SINGULAIR) 10 MG tablet Take 10 mg by mouth at bedtime.  07/23/19   [provider]  Multiple Vitamin (MULTIVITAMIN WITH MINERALS) TABS tablet Take 1 tablet by mouth daily. Patient not taking: Reported on 03/18/2021    [provider]  nitroGLYCERIN (NITROSTAT) 0.4 MG SL tablet Place 0.4 mg under the tongue every 5 (five) minutes as needed for chest pain.  Patient not taking: Reported on 03/18/2021    [provider]  nystatin cream (MYCOSTATIN) Apply 1 application topically 2 (two) times daily. 03/10/21   [provider]  omeprazole (PRILOSEC) 40 MG capsule Take 40 mg by mouth 2 (two) times daily. 10/22/18   [provider]  potassium chloride SA (KLOR-CON) 20 MEQ tablet Take 1 tablet (20 mEq total) by mouth 2 (two) times daily. 05/21/20   Alford Highland, MD  rOPINIRole (REQUIP XL) 4 MG 24 hr tablet Take 4 mg by mouth daily. 01/28/21   [provider]  sertraline (ZOLOFT) 100 MG tablet Take 100 mg by mouth 2 (two) times daily. 11/19/18 04/24/30  [provider]  sucralfate  (CARAFATE) 1 GM/10ML suspension SMARTSIG:Milliliter(s) By Mouth 03/08/21   [provider]  torsemide (DEMADEX) 20 MG tablet Take 2 tablets (40 mg total) by mouth 2 (two) times daily. 04/24/20   Debbe Odea, MD  traZODone (DESYREL) 100 MG tablet Take 1 tablet (100 mg total) by mouth at bedtime. 10/28/19   Alford Highland, MD  vitamin B-12 (CYANOCOBALAMIN) 1000 MCG tablet Take 1,000 mcg by mouth daily. Patient not taking: Reported on 03/18/2021    [provider]  vitamin E 180 MG (400 UNITS) capsule Take 400 Units by mouth daily. Patient not taking: Reported on 03/18/2021    [provider]     Allergies  Penicillins   Family History   Family History  Problem Relation Age of Onset   Cancer Mother    Depression Mother    Diabetes Mother    Hypertension Mother    Aneurysm Mother    Heart disease Father    Diabetes Sister    Diabetes Brother    Diabetes Brother      Physical Exam  Triage Vital Signs: ED Triage Vitals  Enc Vitals Group     BP      Pulse      Resp      Temp      Temp src      SpO2      Weight      Height      Head Circumference      Peak Flow      Pain Score      Pain Loc      Pain Edu?      Excl. in GC?     Updated Vital Signs: BP (!) 101/54   Pulse 69   Temp 98.1 F (36.7 C) (Oral)   Resp 19   SpO2 94%    General: Awake, no distress.  CV:  RRR.  Good peripheral perfusion.  Resp:  Normal effort.  CTA B Abd:  Nontender to light or deep palpation.  No distention.  Other:  Alert and oriented x3.  CN II-XII grossly intact. MAEx4.  Easily reducible  mild rectal prolapse.   ED Results / Procedures / Treatments  Labs (all labs ordered are listed, but only abnormal results are displayed) Labs Reviewed  CBC WITH DIFFERENTIAL/PLATELET - Abnormal; Notable for the following components:      Result Value   Hemoglobin 11.5 (*)    All other components within normal limits  COMPREHENSIVE METABOLIC PANEL - Abnormal;  Notable for the following components:   Chloride 95 (*)    Glucose, Bld 111 (*)    Creatinine, Ser 1.28 (*)    Total Protein 8.2 (*)    GFR, Estimated 42 (*)    All other components within normal limits  URINALYSIS, ROUTINE W REFLEX MICROSCOPIC - Abnormal; Notable for the following components:   Color, Urine YELLOW (*)    APPearance CLOUDY (*)    Hgb urine dipstick SMALL (*)    Leukocytes,Ua LARGE (*)    WBC, UA >50 (*)    Bacteria, UA FEW (*)    Non Squamous Epithelial PRESENT (*)    All other components within normal limits  RESP PANEL BY RT-PCR (FLU A&B, COVID) ARPGX2  CULTURE, BLOOD (ROUTINE X 2)  CULTURE, BLOOD (ROUTINE X 2)  URINE CULTURE  LACTIC ACID, PLASMA  TROPONIN I (HIGH SENSITIVITY)     EKG  ED ECG REPORT I, Neilani Duffee J, the attending physician, personally viewed and interpreted this ECG.   Date: 06/08/2021  EKG Time: 0335  Rate: 61  Rhythm: Atrial paced rhythm  Axis: Normal  Intervals: QTc 507  ST&T Change: Nonspecific Note previous EKGs erroneous, picking up atrial paced rhythm at double patient's actual heart rate   RADIOLOGY I have independently visualized and reviewed patient's x-rays as well as noted the radiology interpretation:  Three-way abdomen: No acute abnormality  Official radiology report(s): DG Abd Acute W/Chest  Result Date: 06/08/2021 CLINICAL DATA:  Found unresponsive EXAM: DG ABDOMEN ACUTE WITH 1 VIEW CHEST COMPARISON:  06/19/2020 FINDINGS: There is no evidence of dilated bowel loops or free intraperitoneal air. No radiopaque calculi or other significant radiographic abnormality is seen. Heart size and mediastinal contours are within normal limits. Both lungs are clear. Remote median sternotomy and left chest wall pacemaker. Metallic fragments overlie the mid chest. IMPRESSION: Negative abdominal radiographs.  No acute cardiopulmonary disease. Electronically Signed   By: Deatra Robinson M.D.   On: 06/08/2021 03:25      PROCEDURES:  Critical Care performed: No  .1-3 Lead EKG Interpretation Performed by: Irean Hong, MD Authorized by: Irean Hong, MD     Interpretation: normal     ECG rate:  75   ECG rate assessment: normal     Rhythm: sinus rhythm     Ectopy: none     Conduction: normal   Comments:     Patient placed on cardiac monitor to evaluate for arrhythmias   MEDICATIONS ORDERED IN ED: Medications  hydrocortisone-pramoxine (PROCTOFOAM-HC) rectal foam 1 applicator (has no administration in time range)  cefTRIAXone (ROCEPHIN) 1 g in sodium chloride 0.9 % 100 mL IVPB (0 g Intravenous Stopped 06/08/21 0540)     IMPRESSION / MDM / ASSESSMENT AND PLAN / ED COURSE  I reviewed the triage vital signs and the nursing notes.                             81 year old female sent from SNF for a brief period of unresponsiveness. Differential diagnosis includes, but is not limited to, alcohol, illicit or  prescription medications, or other toxic ingestion; intracranial pathology such as stroke or intracerebral hemorrhage; fever or infectious causes including sepsis; hypoxemia and/or hypercarbia; uremia; trauma; endocrine related disorders such as diabetes, hypoglycemia, and thyroid-related diseases; hypertensive encephalopathy; etc. I have personally reviewed patient's records and note several telephone conversations from last month regarding pain from her rectal prolapse.  The patient is on the cardiac monitor to evaluate for evidence of arrhythmia and/or significant heart rate changes.  We will obtain sepsis work-up, chest x-ray, UA and monitor.  Clinical Course as of 06/08/21 0542  Tue Jun 08, 2021  0425 Laboratory results demonstrate normal WBC of 7.2, creatinine improved from prior, negative lactic acid, troponin and respiratory panel.  Large leukocyte positive UTI.  Will administer IV Rocephin (as indicated in patient's records, although she is allergic to penicillins, she can tolerate IV  Rocephin).  Will prescribe Proctofoam for pain due to small rectal prolapse.  Anticipate discharge back to facility after completion of IV antibiotics as patient shows no sign of sepsis, is hemodynamically stable and has been alert and oriented the entire time. [JS]  0541 5 AM BP erroneous, rechecked currently 101/54 while patient sleeping.  Strict return precautions given.  Patient and her daughter verbalize understanding agree with plan of care. [JS]    Clinical Course User Index [JS] Irean Hong, MD     FINAL CLINICAL IMPRESSION(S) / ED DIAGNOSES   Final diagnoses:  Lower urinary tract infectious disease  Rectal pain     Rx / DC Orders   ED Discharge Orders          Ordered    cephALEXin (KEFLEX) 250 MG capsule  3 times daily        06/08/21 0430    hydrocortisone-pramoxine (PROCTOFOAM-HC) rectal foam  3 times daily        06/08/21 0430             Note:  This document was prepared using Dragon voice recognition software and may include unintentional dictation errors.   Irean Hong, MD 06/08/21 [phone]

## 2021-06-08 NOTE — ED Notes (Signed)
EMS CALLED FOR TRANSPORT BACK TO [facility] ?

## 2021-06-08 NOTE — ED Notes (Addendum)
Call made to writer by Janae Bridgeman, NP on-call for [facility] to provided report on pt coming to ED. Per Janae Bridgeman, pt was taken to bathroom by staff with no issue but 40 minutes later, staff found pt unresponsive to sternal rub, hypotensive at 87/57 and oxygen saturation at 93% RA. No interventions provided by staff per NP due to staff reporting no resources available. Pt to come by EMS.  ? ? ?VS and CBG reported  ?CBG 151 ?HR- 75bpm ?BP- 87/57 ?RR- 16 ?O2- 93% RA ?

## 2021-06-08 NOTE — ED Notes (Signed)
Patient's brief changed at this time. ?

## 2021-06-08 NOTE — ED Notes (Signed)
per ems findings pt was denies dizziness or lightheadedhess. pt bp per ems was 150/96 after transfer , last one was 152/84. pt alert and oriented x2. per facility their cbg was 148. ?

## 2021-06-08 NOTE — ED Notes (Signed)
Patient resting comfortably at this time. NAD noted. ?

## 2021-06-10 LAB — URINE CULTURE: Culture: >=100000 — AB

## 2021-06-11 ENCOUNTER — Ambulatory Visit (INDEPENDENT_AMBULATORY_CARE_PROVIDER_SITE_OTHER): Payer: Medicare Other

## 2021-06-11 DIAGNOSIS — I440 Atrioventricular block, first degree: Secondary | ICD-10-CM | POA: Diagnosis not present

## 2021-06-13 LAB — CULTURE, BLOOD (ROUTINE X 2)
Culture: NO GROWTH
Culture: NO GROWTH
Special Requests: ADEQUATE
Special Requests: ADEQUATE

## 2021-06-15 LAB — CUP PACEART REMOTE DEVICE CHECK
Battery Remaining Longevity: 155 mo
Battery Voltage: 3.13 V
Brady Statistic AP VP Percent: 0.08 %
Brady Statistic AP VS Percent: 98.68 %
Brady Statistic AS VP Percent: 0 %
Brady Statistic AS VS Percent: 1.25 %
Brady Statistic RA Percent Paced: 98.66 %
Brady Statistic RV Percent Paced: 0.09 %
Date Time Interrogation Session: 20220324181125
Implantable Lead Implant Date: 20080806
Implantable Lead Implant Date: 20080806
Implantable Lead Location: 753859
Implantable Lead Location: 753860
Implantable Pulse Generator Implant Date: 20210617
Lead Channel Impedance Value: 494 Ohm
Lead Channel Impedance Value: 532 Ohm
Lead Channel Impedance Value: 608 Ohm
Lead Channel Impedance Value: 646 Ohm
Lead Channel Pacing Threshold Amplitude: 0.875 V
Lead Channel Pacing Threshold Amplitude: 1 V
Lead Channel Pacing Threshold Pulse Width: 0.4 ms
Lead Channel Pacing Threshold Pulse Width: 0.4 ms
Lead Channel Sensing Intrinsic Amplitude: 20.5 mV
Lead Channel Sensing Intrinsic Amplitude: 20.5 mV
Lead Channel Sensing Intrinsic Amplitude: 3.75 mV
Lead Channel Sensing Intrinsic Amplitude: 3.75 mV
Lead Channel Setting Pacing Amplitude: 1.75 V
Lead Channel Setting Pacing Amplitude: 2.5 V
Lead Channel Setting Pacing Pulse Width: 0.4 ms
Lead Channel Setting Sensing Sensitivity: 4 mV

## 2021-06-16 NOTE — Progress Notes (Signed)
Remote pacemaker transmission.   

## 2021-08-02 ENCOUNTER — Other Ambulatory Visit: Payer: Self-pay

## 2021-08-02 ENCOUNTER — Ambulatory Visit (INDEPENDENT_AMBULATORY_CARE_PROVIDER_SITE_OTHER): Payer: Self-pay | Admitting: Gastroenterology

## 2021-08-02 ENCOUNTER — Encounter: Payer: Self-pay | Admitting: Gastroenterology

## 2021-08-02 VITALS — BP 136/80 | HR 77 | Temp 98.2°F | Ht 67.0 in | Wt 157.2 lb

## 2021-08-02 DIAGNOSIS — K623 Rectal prolapse: Secondary | ICD-10-CM

## 2021-08-02 NOTE — Progress Notes (Deleted)
?  ?Cephas Darby, MD ?[address]  ?[address]  ?[place], [place] [address]  ?Main: [phone]  ?Fax: [phone] ? ? ? ?Gastroenterology Consultation ? ?Referring Provider:     Abran Cantor, PA-C ?Primary Care Physician:  System, Provider Not In ?Primary Gastroenterologist:  Dr. Cephas Darby ?Reason for Consultation:     *** ?      ? HPI:   ?Kristi [name] is a 81 y.o. female referred by Dr. Estanislado Spire, Provider Not In  for consultation & management of *** ? ?NSAIDs: *** ? ?Antiplts/Anticoagulants/Anti thrombotics: *** ? ?GI Procedures: *** ? ?Past Medical History:  ?Diagnosis Date  ? Anxiety   ? Arthritis   ? Broken arm   ? left FA 3/17  ? Broken arm   ? left  ? CAD (coronary artery disease)   ? s/p MI  ? Cancer [facility])   ? uterine  ? Cervical disc disorder   ? Depression   ? Diabetes mellitus without complication ([place])   ? GERD (gastroesophageal reflux disease)   ? Headache   ? Hyperlipidemia   ? Hypertension   ? Hypothyroid   ? Myocardial infarction [facility])   ? 15 year ago  ? Neck pain 06/04/2014  ? Parkinson's disease ([place])   ? Presence of permanent cardiac pacemaker   ? Spinal stenosis   ? ? ?Past Surgical History:  ?Procedure Laterality Date  ? ABDOMINAL HYSTERECTOMY    ? partial  ? BREAST SURGERY    ? CARDIOVERSION N/A 10/24/2019  ? Procedure: CARDIOVERSION;  Surgeon: Wellington Hampshire, MD;  Location: [facility] ORS;  Service: Cardiovascular;  Laterality: N/A;  ? COLONOSCOPY    ? COLONOSCOPY N/A 01/30/2020  ? Procedure: COLONOSCOPY;  Surgeon: Toledo, Benay Pike, MD;  Location: [facility] ENDOSCOPY;  Service: Gastroenterology;  Laterality: N/A;  ? COLONOSCOPY WITH PROPOFOL N/A 11/29/2017  ? Procedure: COLONOSCOPY WITH PROPOFOL;  Surgeon: [name], MD;  Location: [facility] ENDOSCOPY;  Service: Endoscopy;  Laterality: N/A;  ? CORONARY ANGIOPLASTY    ? stent  ? CORONARY ARTERY BYPASS GRAFT    ? CORONARY STENT PLACEMENT    ? ESOPHAGOGASTRODUODENOSCOPY (EGD) WITH PROPOFOL N/A 04/18/2017  ? Procedure:  ESOPHAGOGASTRODUODENOSCOPY (EGD) WITH PROPOFOL;  Surgeon: [name], MD;  Location: [facility] ENDOSCOPY;  Service: Endoscopy;  Laterality: N/A;  ? ESOPHAGOGASTRODUODENOSCOPY (EGD) WITH PROPOFOL N/A 11/29/2017  ? Procedure: ESOPHAGOGASTRODUODENOSCOPY (EGD) WITH PROPOFOL;  Surgeon: [name], MD;  Location: [facility] ENDOSCOPY;  Service: Endoscopy;  Laterality: N/A;  ? FLEXIBLE SIGMOIDOSCOPY N/A 05/17/2020  ? Procedure: FLEXIBLE SIGMOIDOSCOPY;  Surgeon: Lin Landsman, MD;  Location: [facility] ENDOSCOPY;  Service: Gastroenterology;  Laterality: N/A;  ? INSERT / REPLACE / REMOVE PACEMAKER    ? PPM GENERATOR CHANGEOUT N/A 09/05/2019  ? Procedure: PPM GENERATOR CHANGEOUT;  Surgeon: [name], MD;  Location: [place] CV LAB;  Service: Cardiovascular;  Laterality: N/A;  ? s/p pacer insertion    ? TRANSANAL EXCISION OF RECTAL MASS N/A 01/17/2018  ? Procedure: TRANSANAL EXCISION OF RECTAL POLYP;  Surgeon: [name], MD;  Location: [facility] ORS;  Service: General;  Laterality: N/A;  ? ? ?Prior to Admission medications   ?Medication Sig Start Date End Date Taking? Authorizing Provider  ?amiodarone (PACERONE) 200 MG tablet Take 100 mg by mouth daily. 07/24/21  Yes [provider]  ?apixaban (ELIQUIS) 5 MG TABS tablet Take 1 tablet (5 mg total) by mouth 2 (two) times daily. 06/19/20  Yes Mariel Aloe, MD  ?busPIRone (BUSPAR) 5  MG tablet 2 (two) times daily 03/05/21  Yes [provider]  ?Diclofenac Sodium 3 % GEL Apply topically. 07/02/21  Yes [provider]  ?donepezil (ARICEPT) 10 MG tablet Take 10 mg by mouth daily.    Yes [provider]  ?Ferrous Sulfate (SLOW FE) 142 (45 Fe) MG TBCR Take 1 tablet by mouth twice daily 11/19/19  Yes [name], MD  ?gabapentin (NEURONTIN) 300 MG capsule Take by mouth. 04/17/21  Yes [provider]  ?HYDROcodone-acetaminophen (NORCO/VICODIN) 5-325 MG tablet Take 1 tablet by mouth 2 (two) times daily as needed. 06/11/21  Yes [provider]  ?hydrocortisone-pramoxine (PROCTOFOAM-HC) rectal foam Place 1 applicator rectally 3 (three) times daily. 06/08/21  Yes Paulette Blanch, MD  ?linagliptin (TRADJENTA) 5 MG TABS tablet Take 1 tablet (5 mg total) by mouth daily. 05/21/20  Yes Wieting, Richard, MD  ?memantine (NAMENDA) 5 MG tablet Take 5 mg by mouth at bedtime. 05/04/20  Yes [provider]  ?montelukast (SINGULAIR) 10 MG tablet Take 10 mg by mouth at bedtime.  07/23/19  Yes [provider]  ?omeprazole (PRILOSEC) 20 MG capsule Take 20 mg by mouth at bedtime. 06/09/21  Yes [provider]  ?potassium chloride (KLOR-CON) 20 MEQ packet Take 20 mEq by mouth 2 (two) times daily. 02/15/21  Yes [provider]  ?psyllium (KONSYL) 33 % POWD Take by mouth. 06/29/21 06/29/22 Yes [provider]  ?rOPINIRole (REQUIP XL) 4 MG 24 hr tablet Take 4 mg by mouth daily. 01/28/21  Yes [provider]  ?sertraline (ZOLOFT) 100 MG tablet Take 100 mg by mouth 2 (two) times daily. 11/19/18 04/24/30 Yes [provider]  ?sucralfate (CARAFATE) 1 GM/10ML suspension SMARTSIG:Milliliter(s) By Mouth 03/08/21  Yes [provider]  ?SYSTANE ULTRA PF 0.4-0.3 % SOLN Apply to eye. 03/23/21  Yes [provider]  ?torsemide (DEMADEX) 20 MG tablet Take 2 tablets (40 mg total) by mouth 2 (two) times daily. 04/24/20  Yes Kate Sable, MD  ?traMADol Veatrice Bourbon) 50 MG tablet SMARTSIG:0.5 Tablet(s) By Mouth Every 4 Hours PRN 04/20/21  Yes [provider]  ?traZODone (DESYREL) 100 MG tablet Take 1 tablet (100 mg total) by mouth at bedtime. 10/28/19  Yes Loletha Grayer, MD  ?acetaminophen (TYLENOL) 325 MG tablet Take 2 tablets (650 mg total) by mouth every 6 (six) hours as needed for mild pain (or Fever >/= 101). ?Patient not taking: Reported on 03/18/2021 05/21/20   Loletha Grayer, MD  ?albuterol (VENTOLIN HFA) 108 (90 Base) MCG/ACT inhaler Inhale 2 puffs into the lungs every 6 (six) hours as needed for  wheezing or shortness of breath. ?Patient not taking: Reported on 05/18/2020 10/28/19   Loletha Grayer, MD  ?cholecalciferol (VITAMIN D3) 25 MCG (1000 UNIT) tablet Take 1,000 Units by mouth daily. ?Patient not taking: Reported on 03/18/2021    [provider]  ?cholestyramine (QUESTRAN) 4 g packet Take 1 packet (4 g total) by mouth daily. in water ?Patient not taking: Reported on 03/18/2021 10/28/19   Loletha Grayer, MD  ?COLACE 100 MG capsule Take 100 mg by mouth every 12 (twelve) hours. ?Patient not taking: Reported on 08/02/2021 02/15/21   [provider]  ?hydrocortisone (ANUSOL-HC) 25 MG suppository Place 1 suppository (25 mg total) rectally 2 (two) times daily as needed for hemorrhoids or anal itching. Okay to substitute generic ?Patient not taking: Reported on 03/18/2021 05/21/20   Loletha Grayer, MD  ?hydrocortisone 2.5 % cream Apply 1 application topically 2 (two) times daily. ?Patient not  taking: Reported on 03/18/2021 05/11/20   [provider]  ?hydrOXYzine (ATARAX) 50 MG tablet Take 50 mg by mouth 2 (two) times daily. ?Patient not taking: Reported on 08/02/2021 07/20/21   [provider]  ?levothyroxine (SYNTHROID) 150 MCG tablet Take 150 mcg by mouth daily. ?Patient not taking: Reported on 08/02/2021 07/27/21   [provider]  ?linaclotide (LINZESS) 72 MCG capsule Take 72 mcg by mouth daily before breakfast. ?Patient not taking: Reported on 03/18/2021    [provider]  ?loperamide (IMODIUM) 2 MG capsule Take 1 capsule (2 mg total) by mouth as needed for diarrhea or loose stools. ?Patient not taking: Reported on 03/18/2021 06/19/20   Mariel Aloe, MD  ?loratadine (CLARITIN) 10 MG tablet Take 10 mg by mouth daily.  ?Patient not taking: Reported on 03/18/2021    [provider]  ?meclizine (ANTIVERT) 12.5 MG tablet Take 1 tablet (12.5 mg total) by mouth 2 (two) times daily as needed for dizziness. ?Patient not taking: Reported on 03/18/2021 09/30/19    Jennye Boroughs, MD  ?metoprolol succinate (TOPROL XL) 25 MG 24 hr tablet Take 0.5 tablets (12.5 mg total) by mouth daily. 06/19/20 06/19/21  Mariel Aloe, MD  ?[facility] 17 GM/SCOOP powder SMARTSIG:17 Gram(s) By Mouth Every Ot

## 2021-08-03 NOTE — Progress Notes (Signed)
Patient is seen by [facility] GI recently ? ?RV ?

## 2021-09-10 ENCOUNTER — Ambulatory Visit (INDEPENDENT_AMBULATORY_CARE_PROVIDER_SITE_OTHER): Payer: Medicare Other

## 2021-09-10 DIAGNOSIS — I440 Atrioventricular block, first degree: Secondary | ICD-10-CM

## 2021-09-14 LAB — CUP PACEART REMOTE DEVICE CHECK
Battery Remaining Longevity: 138 mo
Battery Voltage: 3.03 V
Brady Statistic AP VP Percent: 0.07 %
Brady Statistic AP VS Percent: 98.39 %
Brady Statistic AS VP Percent: 0 %
Brady Statistic AS VS Percent: 1.55 %
Brady Statistic RA Percent Paced: 98.5 %
Brady Statistic RV Percent Paced: 0.07 %
Date Time Interrogation Session: 20230623035607
Implantable Lead Implant Date: 20080806
Implantable Lead Implant Date: 20080806
Implantable Lead Location: 753859
Implantable Lead Location: 753860
Implantable Pulse Generator Implant Date: 20210617
Lead Channel Impedance Value: 475 Ohm
Lead Channel Impedance Value: 475 Ohm
Lead Channel Impedance Value: 551 Ohm
Lead Channel Impedance Value: 627 Ohm
Lead Channel Pacing Threshold Amplitude: 0.625 V
Lead Channel Pacing Threshold Amplitude: 0.75 V
Lead Channel Pacing Threshold Pulse Width: 0.4 ms
Lead Channel Pacing Threshold Pulse Width: 0.4 ms
Lead Channel Sensing Intrinsic Amplitude: 14.625 mV
Lead Channel Sensing Intrinsic Amplitude: 14.625 mV
Lead Channel Sensing Intrinsic Amplitude: 2.75 mV
Lead Channel Sensing Intrinsic Amplitude: 2.75 mV
Lead Channel Setting Pacing Amplitude: 1.75 V
Lead Channel Setting Pacing Amplitude: 2.5 V
Lead Channel Setting Pacing Pulse Width: 0.4 ms
Lead Channel Setting Sensing Sensitivity: 4 mV

## 2021-09-15 NOTE — Progress Notes (Signed)
Remote pacemaker transmission.   

## 2021-10-12 ENCOUNTER — Encounter: Payer: Medicare Other | Admitting: Internal Medicine

## 2021-12-10 ENCOUNTER — Ambulatory Visit (INDEPENDENT_AMBULATORY_CARE_PROVIDER_SITE_OTHER): Payer: Medicare Other

## 2021-12-10 DIAGNOSIS — I495 Sick sinus syndrome: Secondary | ICD-10-CM

## 2021-12-13 LAB — CUP PACEART REMOTE DEVICE CHECK
Battery Remaining Longevity: 136 mo
Battery Voltage: 3.03 V
Brady Statistic AP VP Percent: 0.06 %
Brady Statistic AP VS Percent: 98.71 %
Brady Statistic AS VP Percent: 0 %
Brady Statistic AS VS Percent: 1.23 %
Brady Statistic RA Percent Paced: 98.83 %
Brady Statistic RV Percent Paced: 0.06 %
Date Time Interrogation Session: 20230922015351
Implantable Lead Implant Date: 20080806
Implantable Lead Implant Date: 20080806
Implantable Lead Location: 753859
Implantable Lead Location: 753860
Implantable Pulse Generator Implant Date: 20210617
Lead Channel Impedance Value: 418 Ohm
Lead Channel Impedance Value: 437 Ohm
Lead Channel Impedance Value: 532 Ohm
Lead Channel Impedance Value: 589 Ohm
Lead Channel Pacing Threshold Amplitude: 0.625 V
Lead Channel Pacing Threshold Amplitude: 0.75 V
Lead Channel Pacing Threshold Pulse Width: 0.4 ms
Lead Channel Pacing Threshold Pulse Width: 0.4 ms
Lead Channel Sensing Intrinsic Amplitude: 12.25 mV
Lead Channel Sensing Intrinsic Amplitude: 12.25 mV
Lead Channel Sensing Intrinsic Amplitude: 2.75 mV
Lead Channel Sensing Intrinsic Amplitude: 2.75 mV
Lead Channel Setting Pacing Amplitude: 1.5 V
Lead Channel Setting Pacing Amplitude: 2.5 V
Lead Channel Setting Pacing Pulse Width: 0.4 ms
Lead Channel Setting Sensing Sensitivity: 4 mV

## 2021-12-17 NOTE — Progress Notes (Signed)
Remote pacemaker transmission.   

## 2021-12-28 ENCOUNTER — Ambulatory Visit: Payer: Medicare Other | Attending: Internal Medicine | Admitting: Internal Medicine

## 2021-12-29 ENCOUNTER — Encounter: Payer: Self-pay | Admitting: Internal Medicine

## 2022-01-14 ENCOUNTER — Encounter: Payer: Self-pay | Admitting: Nurse Practitioner

## 2022-01-14 ENCOUNTER — Non-Acute Institutional Stay: Payer: Medicare Other | Admitting: Nurse Practitioner

## 2022-01-14 VITALS — BP 93/55 | HR 69 | Temp 97.4°F | Resp 18 | Wt 139.6 lb

## 2022-01-14 DIAGNOSIS — Z515 Encounter for palliative care: Secondary | ICD-10-CM

## 2022-01-14 DIAGNOSIS — R634 Abnormal weight loss: Secondary | ICD-10-CM

## 2022-01-14 DIAGNOSIS — R0602 Shortness of breath: Secondary | ICD-10-CM

## 2022-01-14 DIAGNOSIS — R630 Anorexia: Secondary | ICD-10-CM

## 2022-01-14 NOTE — Progress Notes (Signed)
[place] Consult Note Telephone: [phone]  Fax: [phone]    Date of encounter: 01/14/22 1:04 PM PATIENT NAME: Kristi [name] [id number]   [id number] (home)  DOB: April 21, 1940 MRN: [id number] PRIMARY CARE PROVIDER:    [place]:    Contact Information     Name Relation Home Work Mobile   [place] Daughter [phone]    [place]helia Shoulder Daughter [phone]  [phone]     I met face to face with patient in facility. Palliative Care was asked to follow this patient by consultation request of  [place] to address advance care planning and complex medical decision making. This is a follow up visit.                                  ASSESSMENT AND PLAN / RECOMMENDATIONS: Symptom Management/Plan: 1. Advance Care Planning; Full code, full scope    2. Shortness of breath improved secondary to CAD, dCHF, continue torsemide, currently off supplemental O2, monitor for edema, respiratory status. Reviewed and continue current plan;    3. Anorexia; weight loss, ongoing, discussed nutrition, snacks, supplements; weights; transition to LTC. Continue with current plan, supportive role 02/23/2021 weight 172.1 lbs 03/24/2021 weight 172 lbs 12/22/2021 weight 139.6 lbs 32.4 lbs/10 months; 18.84% 4. Palliative care encounter; Palliative care encounter; Palliative medicine team will continue to support patient, patient's family, and medical team. Visit consisted of counseling and education dealing with the complex and emotionally intense issues of symptom management and palliative care in the setting of serious and potentially life-threatening illness   4. f/u 8 weeks for ongoing monitoring chronic disease progression, ongoing discussions complex medical decision making   Follow up Palliative Care Visit:  Palliative care will continue to follow for complex medical decision making, advance care planning, and clarification of goals. Return 4 weeks or prn.   I spent 47 minutes providing this consultation start at 11:45 am. More than 50% of the time in this consultation was spent in counseling and care coordination.   PPS: 50%   Chief Complaint: Follow up palliative consult for complex medical decision making   HISTORY OF PRESENT ILLNESS:  Kristi [name] is a 81 y.o. year old female  with multiple medical history significant of hypertension, hyperlipidemia, diabetes mellitus, stroke, GERD, hypothyroidism, depression with anxiety, pacemaker placement, Parkinson's disease, CAD, myocardial infarction, uterine cancer, dCHF, atrial fibrillation not on anticoagulants, chronic pain syndrome, GI bleeding, dementia, iron deficiency anemia. Kristi [name] resides at SNF at [facility]. Kristi [name] is able to transfer to w/c, propels in her room, toilets herself. Kristi [name] is able to assist with bathing, dressing. Kristi [name] feeds herself with weight loss. No recent hospitalizations, infections, falls per staff. Continues with incontinence. At present Kristi [name] is sitting in the w/c in her room. Kristi [name] and I talked about pc visit, Kristi [name] in agreement. We talked about how she has been feeling. Kristi [name] endorses overall she has been doing well, no complaints. Kristi [name] endorses another resident has been visiting her, bringing her snacks and morning coffee but he "got on my nerves so much, he had a brain injury, I told him not to come back in my room". We talked about social isolation as she spends most of her time in her room. We talked about her  daily routine. We talked about quality of life, what brings her joy. We talked about residing at a facility. We talked about her son and daughter. We talked about ros, appetite including weight loss, foods she likes/dislikes. We talked about snacks,  supplements. We talked about her functional abilities. We talked about role pc in poc. Most pc visit supportive. Medical goals reviewed. I attempted to contact daughter, I updated staff, will f/u in 4 weeks due to weight loss. Kristi [name] was very engaging, interactive, pleasant, cooperative during visit.     History obtained from review of EMR, discussion with facility staff and  Kristi [name].  I reviewed available labs, medications, imaging, studies and related documents from the EMR.  Records reviewed and summarized above.    ROS 10 point system reviewed with Kristi [name] and facility staff all negative except HPI   Physical Exam: Constitutional: NAD General: pleasant female EYES: lids intact ENMT: oral mucous membranes moist CV: S1S2, RRR Pulmonary: LCTA, no increased work of breathing, no cough, room air Abdomen:  normo-active BS + 4 quadrants, soft and non tender MSK: w/c dependent Skin: warm and dry Neuro:  + generalized weakness,  + cognitive impairment Psych: non-anxious affect, A and O x 3  Thank you for the opportunity to participate in the care of Kristi [name].  The palliative care team will continue to follow. Please call our office at [phone] if we can be of additional assistance.   Reggie Welge Ihor Gully, NP

## 2022-03-11 ENCOUNTER — Ambulatory Visit (INDEPENDENT_AMBULATORY_CARE_PROVIDER_SITE_OTHER): Payer: Medicare Other

## 2022-03-11 DIAGNOSIS — I495 Sick sinus syndrome: Secondary | ICD-10-CM

## 2022-03-11 LAB — CUP PACEART REMOTE DEVICE CHECK
Battery Remaining Longevity: 132 mo
Battery Voltage: 3.03 V
Brady Statistic AP VP Percent: 0.09 %
Brady Statistic AP VS Percent: 98.88 %
Brady Statistic AS VP Percent: 0 %
Brady Statistic AS VS Percent: 1.03 %
Brady Statistic RA Percent Paced: 99.03 %
Brady Statistic RV Percent Paced: 0.09 %
Date Time Interrogation Session: 20231222021519
Implantable Lead Connection Status: 753985
Implantable Lead Connection Status: 753985
Implantable Lead Implant Date: 20080806
Implantable Lead Implant Date: 20080806
Implantable Lead Location: 753859
Implantable Lead Location: 753860
Implantable Pulse Generator Implant Date: 20210617
Lead Channel Impedance Value: 399 Ohm
Lead Channel Impedance Value: 418 Ohm
Lead Channel Impedance Value: 513 Ohm
Lead Channel Impedance Value: 570 Ohm
Lead Channel Pacing Threshold Amplitude: 0.625 V
Lead Channel Pacing Threshold Amplitude: 0.75 V
Lead Channel Pacing Threshold Pulse Width: 0.4 ms
Lead Channel Pacing Threshold Pulse Width: 0.4 ms
Lead Channel Sensing Intrinsic Amplitude: 12.75 mV
Lead Channel Sensing Intrinsic Amplitude: 12.75 mV
Lead Channel Sensing Intrinsic Amplitude: 4.625 mV
Lead Channel Sensing Intrinsic Amplitude: 4.625 mV
Lead Channel Setting Pacing Amplitude: 1.5 V
Lead Channel Setting Pacing Amplitude: 2.5 V
Lead Channel Setting Pacing Pulse Width: 0.4 ms
Lead Channel Setting Sensing Sensitivity: 4 mV
Zone Setting Status: 755011
Zone Setting Status: 755011

## 2022-03-17 ENCOUNTER — Telehealth: Payer: Self-pay | Admitting: Oncology

## 2022-03-17 ENCOUNTER — Other Ambulatory Visit: Payer: Self-pay

## 2022-03-17 DIAGNOSIS — C211 Malignant neoplasm of anal canal: Secondary | ICD-10-CM

## 2022-03-17 NOTE — Telephone Encounter (Signed)
Attempt made to contact pt x2, unable to LVM due to VM being full

## 2022-03-18 ENCOUNTER — Ambulatory Visit: Admission: RE | Admit: 2022-03-18 | Payer: Medicare Other | Source: Ambulatory Visit

## 2022-03-22 ENCOUNTER — Inpatient Hospital Stay: Payer: Medicare Other

## 2022-03-22 ENCOUNTER — Inpatient Hospital Stay: Payer: Medicare Other | Admitting: Oncology

## 2022-03-28 ENCOUNTER — Non-Acute Institutional Stay: Payer: Medicare Other | Admitting: Nurse Practitioner

## 2022-03-28 ENCOUNTER — Encounter: Payer: Self-pay | Admitting: Nurse Practitioner

## 2022-03-28 VITALS — BP 110/60 | HR 70 | Temp 97.9°F | Resp 18 | Wt 136.4 lb

## 2022-03-28 DIAGNOSIS — R0602 Shortness of breath: Secondary | ICD-10-CM

## 2022-03-28 DIAGNOSIS — R630 Anorexia: Secondary | ICD-10-CM

## 2022-03-28 DIAGNOSIS — Z515 Encounter for palliative care: Secondary | ICD-10-CM

## 2022-03-28 DIAGNOSIS — I5032 Chronic diastolic (congestive) heart failure: Secondary | ICD-10-CM

## 2022-03-28 DIAGNOSIS — R634 Abnormal weight loss: Secondary | ICD-10-CM

## 2022-03-28 NOTE — Progress Notes (Signed)
Kristi [name] Consult Note Telephone: [phone]  Fax: [phone]    Date of encounter: 03/28/22 1:40 PM PATIENT NAME: Kristi [name] [id number]   [id number] (home)  DOB: Nov 16, 1940 MRN: [id number] PRIMARY CARE PROVIDER:    [place]:    Contact Information     Name Relation Home Work Mobile   Kristi [name] Daughter [phone]    Kristi [name] Daughter [phone]  [phone]     I met face to face with patient in facility. Palliative Care was asked to follow this patient by consultation request of  Kristi [name] to address advance care planning and complex medical decision making. This is a follow up visit.                                  ASSESSMENT AND PLAN / RECOMMENDATIONS: Symptom Management/Plan: 1. Advance Care Planning; Full code, full scope    2. Shortness of breath stable secondary to CAD, dCHF, continue torsemide, currently off supplemental O2, monitor for edema, respiratory status. Reviewed and continue current plan;     3. Anorexia; weight loss, ongoing, discussed nutrition, snacks, supplements; weights; transition to LTC. Continue with current plan, supportive role 02/23/2021 weight 172.1 lbs 03/24/2021 weight 172 lbs 12/22/2021 weight 139.6 lbs 03/24/2022 weight 136.4 lbs  4. Palliative care encounter; Palliative care encounter; Palliative medicine team will continue to support patient, patient's family, and medical team. Visit consisted of counseling and education dealing with the complex and emotionally intense issues of symptom management and palliative care in the setting of serious and potentially life-threatening illness Follow up Palliative Care Visit: Palliative care will continue to follow for complex medical decision making, advance care planning, and clarification of goals. Return  4 to 8 weeks or prn.   I spent 45 minutes providing this consultation start at 1:00 pm. More than 50% of the time in this consultation was spent in counseling and care coordination.   PPS: 50% Chief Complaint: Follow up palliative consult for complex medical decision making HISTORY OF PRESENT ILLNESS:  Kristi [name] is a 82 y.o. year old female  with multiple medical history significant of hypertension, hyperlipidemia, diabetes mellitus, stroke, GERD, hypothyroidism, depression with anxiety, pacemaker placement, Parkinson's disease, CAD, myocardial infarction, uterine cancer, dCHF, atrial fibrillation not on anticoagulants, chronic pain syndrome, GI bleeding, dementia, iron deficiency anemia. Kristi [name] resides at SNF at [facility] Kristi [name] is able to transfer to w/c, propels in her room, toilets herself. Kristi [name] is able to assist with bathing, dressing. Kristi [name] feeds herself. Kristi [name] has ongoing weight loss. No other concerns. At present Kristi [name] is sitting in the w/c in her room, appears comfortable. We talked about how she has been feeling, ros, functional ability, appetite, weight loss, pain in buttock area, daily routine, quality of life, what brings her joy. We talked about role pc in poc. Medical goals, poc, medications reviewed. Will continue to follow, monitor weights, appetite, goc. Updated staff, attempted to reach dtg.   History obtained from review of EMR, discussion with facility staff and  Kristi [name].  I reviewed available labs, medications, imaging, studies and related documents from the EMR.  Records reviewed and summarized above.    Physical Exam: Constitutional: NAD General: pleasant female ENMT: oral mucous membranes moist CV: S1S2, RRR Pulmonary: clear, decreased  bases Abdomen:  soft and non tender MSK: w/c dependent Skin: warm and dry Psych: non-anxious affect, A and O x 3 Thank you for the opportunity to participate in the care of Kristi.  [place]. Please call our office at [phone] if we can be of additional assistance.   Kristi [name] Ihor Gully, NP

## 2022-03-30 ENCOUNTER — Ambulatory Visit: Admission: RE | Admit: 2022-03-30 | Payer: Medicare Other | Source: Ambulatory Visit

## 2022-03-31 NOTE — Progress Notes (Signed)
Remote pacemaker transmission.   

## 2022-04-05 ENCOUNTER — Ambulatory Visit
Admission: RE | Admit: 2022-04-05 | Discharge: 2022-04-05 | Disposition: A | Discharge status: Home / Self Care | Payer: Medicare Other | Source: Ambulatory Visit | Attending: Oncology | Admitting: Oncology

## 2022-04-05 DIAGNOSIS — C211 Malignant neoplasm of anal canal: Secondary | ICD-10-CM | POA: Insufficient documentation

## 2022-04-05 LAB — POCT I-STAT CREATININE: Creatinine, Ser: 1.2 mg/dL — ABNORMAL HIGH (ref 0.44–1.00)

## 2022-04-05 MED ORDER — IOHEXOL 300 MG/ML  SOLN
100.0000 mL | Freq: Once | INTRAMUSCULAR | Status: AC | PRN
  Administered 2022-04-05: 80 mL via INTRAVENOUS

## 2022-04-06 ENCOUNTER — Inpatient Hospital Stay: Payer: Medicare Other

## 2022-04-06 ENCOUNTER — Inpatient Hospital Stay: Payer: Medicare Other | Admitting: Oncology

## 2022-04-13 ENCOUNTER — Other Ambulatory Visit: Payer: Self-pay

## 2022-04-13 DIAGNOSIS — C211 Malignant neoplasm of anal canal: Secondary | ICD-10-CM

## 2022-04-14 ENCOUNTER — Other Ambulatory Visit: Payer: Self-pay

## 2022-04-14 ENCOUNTER — Encounter: Payer: Self-pay | Admitting: Oncology

## 2022-04-14 ENCOUNTER — Inpatient Hospital Stay (HOSPITAL_BASED_OUTPATIENT_CLINIC_OR_DEPARTMENT_OTHER): Payer: Medicare Other | Admitting: Oncology

## 2022-04-14 ENCOUNTER — Inpatient Hospital Stay: Payer: Medicare Other | Attending: Oncology

## 2022-04-14 VITALS — BP 109/62 | Temp 97.7°F | Resp 18 | Wt 140.8 lb

## 2022-04-14 DIAGNOSIS — K623 Rectal prolapse: Secondary | ICD-10-CM | POA: Insufficient documentation

## 2022-04-14 DIAGNOSIS — C210 Malignant neoplasm of anus, unspecified: Secondary | ICD-10-CM | POA: Insufficient documentation

## 2022-04-14 DIAGNOSIS — Z79899 Other long term (current) drug therapy: Secondary | ICD-10-CM | POA: Insufficient documentation

## 2022-04-14 DIAGNOSIS — C211 Malignant neoplasm of anal canal: Secondary | ICD-10-CM

## 2022-04-14 DIAGNOSIS — D649 Anemia, unspecified: Secondary | ICD-10-CM | POA: Diagnosis not present

## 2022-04-14 LAB — CBC WITH DIFFERENTIAL/PLATELET
Abs Immature Granulocytes: 0.03 10*3/uL (ref 0.00–0.07)
Basophils Absolute: 0 10*3/uL (ref 0.0–0.1)
Basophils Relative: 1 %
Eosinophils Absolute: 0.1 10*3/uL (ref 0.0–0.5)
Eosinophils Relative: 2 %
HCT: 33.6 % — ABNORMAL LOW (ref 36.0–46.0)
Hemoglobin: 10.2 g/dL — ABNORMAL LOW (ref 12.0–15.0)
Immature Granulocytes: 1 %
Lymphocytes Relative: 30 %
Lymphs Abs: 1.8 10*3/uL (ref 0.7–4.0)
MCH: 29.2 pg (ref 26.0–34.0)
MCHC: 30.4 g/dL (ref 30.0–36.0)
MCV: 96.3 fL (ref 80.0–100.0)
Monocytes Absolute: 0.3 10*3/uL (ref 0.1–1.0)
Monocytes Relative: 6 %
Neutro Abs: 3.6 10*3/uL (ref 1.7–7.7)
Neutrophils Relative %: 60 %
Platelets: 233 10*3/uL (ref 150–400)
RBC: 3.49 MIL/uL — ABNORMAL LOW (ref 3.87–5.11)
RDW: 16.7 % — ABNORMAL HIGH (ref 11.5–15.5)
WBC: 5.9 10*3/uL (ref 4.0–10.5)
nRBC: 0 % (ref 0.0–0.2)

## 2022-04-14 LAB — COMPREHENSIVE METABOLIC PANEL
ALT: 15 U/L (ref 0–44)
AST: 25 U/L (ref 15–41)
Albumin: 3.3 g/dL — ABNORMAL LOW (ref 3.5–5.0)
Alkaline Phosphatase: 77 U/L (ref 38–126)
Anion gap: 9 (ref 5–15)
BUN: 20 mg/dL (ref 8–23)
CO2: 29 mmol/L (ref 22–32)
Calcium: 8.5 mg/dL — ABNORMAL LOW (ref 8.9–10.3)
Chloride: 99 mmol/L (ref 98–111)
Creatinine, Ser: 1.1 mg/dL — ABNORMAL HIGH (ref 0.44–1.00)
GFR, Estimated: 50 mL/min — ABNORMAL LOW (ref >60)
Glucose, Bld: 149 mg/dL — ABNORMAL HIGH (ref 70–99)
Potassium: 3.6 mmol/L (ref 3.5–5.1)
Sodium: 137 mmol/L (ref 135–145)
Total Bilirubin: 0.3 mg/dL (ref 0.3–1.2)
Total Protein: 6.6 g/dL (ref 6.5–8.1)

## 2022-04-14 LAB — IRON AND TIBC
Iron: 65 ug/dL (ref 28–170)
Saturation Ratios: 25 % (ref 10.4–31.8)
TIBC: 265 ug/dL (ref 250–450)
UIBC: 200 ug/dL

## 2022-04-14 LAB — FERRITIN: Ferritin: 218 ng/mL (ref 11–307)

## 2022-04-14 NOTE — Assessment & Plan Note (Addendum)
Chronic anemia  iron panel checked today shows normal TIBC, ferritin level.  Not consistent with iron deficiency.   Hemoglobin is close to her baseline.  Continue monitor.

## 2022-04-14 NOTE — Assessment & Plan Note (Signed)
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 Refer to colorectal surgery for evaluation.

## 2022-04-14 NOTE — Assessment & Plan Note (Addendum)
pT1,cN0 M0., Status post definitive radiation treatment.  Chemotherapy was not offered due to comorbidities. CT was reviewed and discussed with patient.  Rectal wall thickening, need direct visualization. Recommend patient to reestablish care with colorectal surgery Dr. Johney Maine .  Referral sent.
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
## 2022-04-14 NOTE — Progress Notes (Signed)
Pt here for follow up. Pt reports that she has pains in her stomach. Pt reports that she has a prolapsed rectum and it gets very painful 10/10 to the point that she takes pain medication. Pt would like to know if there is anything that can be done to prevent rectum from "falling out"

## 2022-04-15 ENCOUNTER — Telehealth: Payer: Self-pay | Admitting: *Deleted

## 2022-04-15 NOTE — Telephone Encounter (Signed)
Per Dr. Tasia Catchings, Iron levels are normal, no need for IV iron.   Called returned to [facility]. Also, called pt's daughter [name] and informed her.

## 2022-04-15 NOTE — Telephone Encounter (Signed)
[facility] calling stating that they were to have gotten a call regarding the patient iron levels from yesterday an day any treatment needed for it. Please return her call

## 2022-06-08 ENCOUNTER — Ambulatory Visit: Payer: Medicare Other | Attending: Surgery | Admitting: Physical Therapy

## 2022-06-08 DIAGNOSIS — M6281 Muscle weakness (generalized): Secondary | ICD-10-CM | POA: Insufficient documentation

## 2022-06-08 DIAGNOSIS — R279 Unspecified lack of coordination: Secondary | ICD-10-CM

## 2022-06-08 DIAGNOSIS — R293 Abnormal posture: Secondary | ICD-10-CM | POA: Diagnosis not present
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
## 2022-06-10 ENCOUNTER — Ambulatory Visit (INDEPENDENT_AMBULATORY_CARE_PROVIDER_SITE_OTHER): Payer: Medicare Other

## 2022-06-10 DIAGNOSIS — I495 Sick sinus syndrome: Secondary | ICD-10-CM

## 2022-06-10 LAB — CUP PACEART REMOTE DEVICE CHECK
Battery Remaining Longevity: 129 mo
Battery Voltage: 3.02 V
Brady Statistic AP VP Percent: 0.08 %
Brady Statistic AP VS Percent: 99.45 %
Brady Statistic AS VP Percent: 0 %
Brady Statistic AS VS Percent: 0.47 %
Brady Statistic RA Percent Paced: 99.56 %
Brady Statistic RV Percent Paced: 0.08 %
Date Time Interrogation Session: 20240322021822
Implantable Lead Connection Status: 753985
Implantable Lead Connection Status: 753985
Implantable Lead Implant Date: 20080806
Implantable Lead Implant Date: 20080806
Implantable Lead Location: 753859
Implantable Lead Location: 753860
Implantable Pulse Generator Implant Date: 20210617
Lead Channel Impedance Value: 418 Ohm
Lead Channel Impedance Value: 418 Ohm
Lead Channel Impedance Value: 513 Ohm
Lead Channel Impedance Value: 570 Ohm
Lead Channel Pacing Threshold Amplitude: 0.625 V
Lead Channel Pacing Threshold Amplitude: 0.75 V
Lead Channel Pacing Threshold Pulse Width: 0.4 ms
Lead Channel Pacing Threshold Pulse Width: 0.4 ms
Lead Channel Sensing Intrinsic Amplitude: 12.5 mV
Lead Channel Sensing Intrinsic Amplitude: 12.5 mV
Lead Channel Sensing Intrinsic Amplitude: 4.5 mV
Lead Channel Sensing Intrinsic Amplitude: 4.5 mV
Lead Channel Setting Pacing Amplitude: 1.5 V
Lead Channel Setting Pacing Amplitude: 2.5 V
Lead Channel Setting Pacing Pulse Width: 0.4 ms
Lead Channel Setting Sensing Sensitivity: 4 mV
Zone Setting Status: 755011
Zone Setting Status: 755011

## 2022-06-29 ENCOUNTER — Non-Acute Institutional Stay: Payer: Medicare Other | Admitting: Nurse Practitioner

## 2022-06-29 ENCOUNTER — Encounter: Payer: Self-pay | Admitting: Nurse Practitioner

## 2022-06-29 VITALS — BP 138/64 | HR 78 | Temp 97.7°F | Resp 18 | Wt 145.4 lb

## 2022-06-29 DIAGNOSIS — R630 Anorexia: Secondary | ICD-10-CM

## 2022-06-29 DIAGNOSIS — Z515 Encounter for palliative care: Secondary | ICD-10-CM

## 2022-06-29 DIAGNOSIS — R0602 Shortness of breath: Secondary | ICD-10-CM

## 2022-06-29 DIAGNOSIS — I5032 Chronic diastolic (congestive) heart failure: Secondary | ICD-10-CM

## 2022-06-29 NOTE — Progress Notes (Signed)
Therapist, nutritional Palliative Care Consult Note Telephone: [phone]  Fax: [phone]    Date of encounter: 06/29/22 4:06 PM PATIENT NAME: Tiburcio Bash [facility] [address] [place] [address]   [phone] (home)  DOB: December 01, 1940 MRN: [id number] PRIMARY CARE PROVIDER:    [facility]  RESPONSIBLE PARTY:    Contact Information     Name Relation Home Work Mobile   [place] Daughter [phone]    [place] Mam Daughter [phone]  [phone]        I met face to face with patient in facility. Palliative Care was asked to follow this patient by consultation request of  [facility] to address advance care planning and complex medical decision making. This is a follow up visit.                                  ASSESSMENT AND PLAN / RECOMMENDATIONS: Symptom Management/Plan: 1. Advance Care Planning; Full code, full scope    2. Shortness of breath stable secondary to CAD, dCHF, continue torsemide, currently off supplemental O2, monitor for edema, respiratory status. Reviewed and continue current plan;     3. Anorexia; weight gain; improving, ongoing, discussed nutrition, snacks, supplements; weights; Continue with current plan, supportive role 12/22/2021 weight 139.6 lbs 03/24/2022 weight 136.4 lbs 06/24/2022 weight 145.4 lbs  4. Palliative care encounter; Palliative care encounter; Palliative medicine team will continue to support patient, patient's family, and medical team. Visit consisted of counseling and education dealing with the complex and emotionally intense issues of symptom management and palliative care in the setting of serious and potentially life-threatening illness Follow up Palliative Care Visit: Palliative care will continue to follow for complex medical decision making, advance care planning, and clarification of goals. Return 4 to 8 weeks or prn.   I  spent 46 minutes providing this consultation start at 11:55am. More than 50% of the time in this consultation was spent in counseling and care coordination.   PPS: 50% Chief Complaint: Follow up palliative consult for complex medical decision making HISTORY OF PRESENT ILLNESS:  Kimorah Shilling is a 82 y.o. year old female  with multiple medical history significant of hypertension, hyperlipidemia, diabetes mellitus, stroke, GERD, hypothyroidism, depression with anxiety, pacemaker placement, Parkinson's disease, CAD, myocardial infarction, uterine cancer, dCHF, atrial fibrillation not on anticoagulants, chronic pain syndrome, GI bleeding, dementia, iron deficiency anemia. Ms. Sare resides at SNF at [facility]. Ms. Krolak is able to transfer to w/c, propels in her room, toilets herself. Ms. Luckenbaugh is able to assist with bathing, dressing. Ms. Kraning feeds herself. Ms Mlakar has ongoing weight loss. No other concerns. At present Ms Latimore is sitting in the w/c in her room, appears comfortable. Purpose of today PC f/u visit further discussion monitor trends of appetite, weights, monitor for functional, cognitive decline with chronic disease progression, assess any active symptoms, supportive role. We talked about how she has been feeling, ros, pain at length to her buttock/rectum. We talked about mobility, activity, daily routine, quality of life what brings her joy. What she does in her room throughout the day. Ms Warfel endorses she does no go out much in the hallways. We talked about socialization, activities. We talked about appetite, nutrition. We talked about role pc in poc. Medical goals, poc, medications reviewed. PC f/u visit further discussion monitor trends of appetite, weights, monitor for functional, cognitive  decline with chronic disease progression, assess any active symptoms, supportive role.Updated staff, attempted to reach dtg.    History obtained from review of EMR,  discussion with facility staff and  Ms. Vidrio.  I reviewed available labs, medications, imaging, studies and related documents from the EMR.  Records reviewed and summarized above.    Physical Exam: General: pleasant female ENMT: oral mucous membranes moist CV: S1S2, RRR Pulmonary: clear, decreased bases Psych: non-anxious affect, A and O x 3 Thank you for the opportunity to participate in the care of Ms. Cowles. Please call our office at [phone] if we can be of additional assistance.   Shanik [place], NP

## 2022-07-12 NOTE — Progress Notes (Signed)
Remote pacemaker transmission.   

## 2022-07-28 ENCOUNTER — Ambulatory Visit: Payer: Medicare Other | Admitting: Physical Therapy

## 2022-08-01 ENCOUNTER — Emergency Department
Admission: EM | Admit: 2022-08-01 | Discharge: 2022-08-01 | Disposition: A | Discharge status: Home / Self Care | Payer: Medicare Other | Attending: Emergency Medicine | Admitting: Emergency Medicine

## 2022-08-01 ENCOUNTER — Other Ambulatory Visit: Payer: Self-pay

## 2022-08-01 ENCOUNTER — Encounter: Payer: Self-pay | Admitting: Intensive Care

## 2022-08-01 DIAGNOSIS — K91841 Postprocedural hemorrhage and hematoma of a digestive system organ or structure following other procedure: Secondary | ICD-10-CM | POA: Diagnosis present

## 2022-08-01 DIAGNOSIS — Y838 Other surgical procedures as the cause of abnormal reaction of the patient, or of later complication, without mention of misadventure at the time of the procedure: Secondary | ICD-10-CM | POA: Diagnosis not present

## 2022-08-01 DIAGNOSIS — K08409 Partial loss of teeth, unspecified cause, unspecified class: Secondary | ICD-10-CM

## 2022-08-01 HISTORY — DX: Heart failure, unspecified: I50.9

## 2022-08-01 LAB — CBC WITH DIFFERENTIAL/PLATELET
Abs Immature Granulocytes: 0.01 10*3/uL (ref 0.00–0.07)
Basophils Absolute: 0.1 10*3/uL (ref 0.0–0.1)
Basophils Relative: 1 %
Eosinophils Absolute: 0.1 10*3/uL (ref 0.0–0.5)
Eosinophils Relative: 2 %
HCT: 31.9 % — ABNORMAL LOW (ref 36.0–46.0)
Hemoglobin: 10 g/dL — ABNORMAL LOW (ref 12.0–15.0)
Immature Granulocytes: 0 %
Lymphocytes Relative: 24 %
Lymphs Abs: 1.1 10*3/uL (ref 0.7–4.0)
MCH: 30.3 pg (ref 26.0–34.0)
MCHC: 31.3 g/dL (ref 30.0–36.0)
MCV: 96.7 fL (ref 80.0–100.0)
Monocytes Absolute: 0.4 10*3/uL (ref 0.1–1.0)
Monocytes Relative: 9 %
Neutro Abs: 3.1 10*3/uL (ref 1.7–7.7)
Neutrophils Relative %: 64 %
Platelets: 187 10*3/uL (ref 150–400)
RBC: 3.3 MIL/uL — ABNORMAL LOW (ref 3.87–5.11)
RDW: 14.4 % (ref 11.5–15.5)
WBC: 4.8 10*3/uL (ref 4.0–10.5)
nRBC: 0 % (ref 0.0–0.2)

## 2022-08-01 LAB — COMPREHENSIVE METABOLIC PANEL
ALT: 14 U/L (ref 0–44)
AST: 19 U/L (ref 15–41)
Albumin: 3.8 g/dL (ref 3.5–5.0)
Alkaline Phosphatase: 73 U/L (ref 38–126)
Anion gap: 10 (ref 5–15)
BUN: 32 mg/dL — ABNORMAL HIGH (ref 8–23)
CO2: 29 mmol/L (ref 22–32)
Calcium: 8.9 mg/dL (ref 8.9–10.3)
Chloride: 98 mmol/L (ref 98–111)
Creatinine, Ser: 1.02 mg/dL — ABNORMAL HIGH (ref 0.44–1.00)
GFR, Estimated: 54 mL/min — ABNORMAL LOW (ref >60)
Glucose, Bld: 147 mg/dL — ABNORMAL HIGH (ref 70–99)
Potassium: 3.4 mmol/L — ABNORMAL LOW (ref 3.5–5.1)
Sodium: 137 mmol/L (ref 135–145)
Total Bilirubin: 0.4 mg/dL (ref 0.3–1.2)
Total Protein: 6.6 g/dL (ref 6.5–8.1)

## 2022-08-01 MED ORDER — TRANEXAMIC ACID FOR EPISTAXIS
500.0000 mg | Freq: Once | TOPICAL | Status: AC
  Administered 2022-08-01: 500 mg via TOPICAL
  Filled 2022-08-01: qty 10

## 2022-08-01 NOTE — ED Provider Notes (Signed)
[facility] Provider Note    Event Date/Time   First MD Initiated Contact with Patient 08/01/22 1051     (approximate)   History   Post-op Problem   HPI  Kristi [name] is a 82 y.o. female who is not on blood thinners who presents with bleeding status post dental extraction about a week ago.  She reports she has had worsening bleeding over the last several days from extraction site.     Physical Exam   Triage Vital Signs: ED Triage Vitals  Enc Vitals Group     BP 08/01/22 1100 138/78     Pulse Rate 08/01/22 1100 88     Resp 08/01/22 1100 20     Temp 08/01/22 1100 98 F (36.7 C)     Temp src --      SpO2 08/01/22 1100 99 %     Weight 08/01/22 1028 62.1 kg (137 lb)     Height 08/01/22 1028 1.702 m (5\' 7" )     Head Circumference --      Peak Flow --      Pain Score 08/01/22 1028 7     Pain Loc --      Pain Edu? --      Excl. in GC? --     Most recent vital signs: Vitals:   08/01/22 1100  BP: 138/78  Pulse: 88  Resp: 20  Temp: 98 F (36.7 C)  SpO2: 99%     General: Awake, no distress.  CV:  Good peripheral perfusion.  Resp:  Normal effort.  Abd:  No distention.  Other:  Right upper third molar extraction site, appears to be well clotted, no current bleeding   ED Results / Procedures / Treatments   Labs (all labs ordered are listed, but only abnormal results are displayed) Labs Reviewed  CBC WITH DIFFERENTIAL/PLATELET - Abnormal; Notable for the following components:      Result Value   RBC 3.30 (*)    Hemoglobin 10.0 (*)    HCT 31.9 (*)    All other components within normal limits  COMPREHENSIVE METABOLIC PANEL - Abnormal; Notable for the following components:   Potassium 3.4 (*)    Glucose, Bld 147 (*)    BUN 32 (*)    Creatinine, Ser 1.02 (*)    GFR, Estimated 54 (*)    All other components within normal limits     EKG     RADIOLOGY     PROCEDURES:  Critical Care performed:    Procedures   MEDICATIONS ORDERED IN ED: Medications  tranexamic acid (CYKLOKAPRON) 1000 MG/10ML topical solution 500 mg (500 mg Topical Given by Other 08/01/22 1112)     IMPRESSION / MDM / ASSESSMENT AND PLAN / ED COURSE  I reviewed the triage vital signs and the nursing notes. Patient's presentation is most consistent with acute complicated illness / injury requiring diagnostic workup.  Apparently referred by dentist office for evaluation because of blood loss.  Patient's hemoglobin is stable.  Applied TXA soaked gauze to the area for 20 minutes, bleeding is stopped, clot has formed, appropriate for discharge at this time with outpatient follow-up        FINAL CLINICAL IMPRESSION(S) / ED DIAGNOSES   Final diagnoses:  Status post tooth extraction     Rx / DC Orders   ED Discharge Orders     None        Note:  This document was prepared using Dragon voice recognition  software and may include unintentional dictation errors.   Jene Every, MD 08/01/22 1525

## 2022-08-01 NOTE — ED Triage Notes (Signed)
Patient has tooth extraction on May 6th. Reports it has been gradually bleeding since and family reports when they reached out to the surgeon office they told her to come to ER.

## 2022-08-01 NOTE — Discharge Instructions (Signed)
If you develop bleeding again from the extraction site, try biting down on a black tea bag, this can often help

## 2022-08-04 ENCOUNTER — Encounter: Payer: Medicare Other | Admitting: Physical Therapy

## 2022-08-07 ENCOUNTER — Emergency Department: Payer: Medicare Other

## 2022-08-07 ENCOUNTER — Inpatient Hospital Stay: Payer: Medicare Other

## 2022-08-07 ENCOUNTER — Inpatient Hospital Stay
Admission: EM | Admit: 2022-08-07 | Discharge: 2022-08-20 | DRG: 871 | Disposition: E | Payer: Medicare Other | Source: Skilled Nursing Facility | Attending: Internal Medicine | Admitting: Internal Medicine

## 2022-08-07 DIAGNOSIS — Z955 Presence of coronary angioplasty implant and graft: Secondary | ICD-10-CM

## 2022-08-07 DIAGNOSIS — E119 Type 2 diabetes mellitus without complications: Secondary | ICD-10-CM | POA: Diagnosis present

## 2022-08-07 DIAGNOSIS — E039 Hypothyroidism, unspecified: Secondary | ICD-10-CM | POA: Diagnosis present

## 2022-08-07 DIAGNOSIS — G9341 Metabolic encephalopathy: Secondary | ICD-10-CM | POA: Diagnosis present

## 2022-08-07 DIAGNOSIS — I110 Hypertensive heart disease with heart failure: Secondary | ICD-10-CM | POA: Diagnosis present

## 2022-08-07 DIAGNOSIS — Z7189 Other specified counseling: Secondary | ICD-10-CM | POA: Diagnosis not present

## 2022-08-07 DIAGNOSIS — I959 Hypotension, unspecified: Secondary | ICD-10-CM | POA: Diagnosis not present

## 2022-08-07 DIAGNOSIS — Z85048 Personal history of other malignant neoplasm of rectum, rectosigmoid junction, and anus: Secondary | ICD-10-CM

## 2022-08-07 DIAGNOSIS — G893 Neoplasm related pain (acute) (chronic): Secondary | ICD-10-CM | POA: Diagnosis present

## 2022-08-07 DIAGNOSIS — Z7901 Long term (current) use of anticoagulants: Secondary | ICD-10-CM

## 2022-08-07 DIAGNOSIS — Z818 Family history of other mental and behavioral disorders: Secondary | ICD-10-CM

## 2022-08-07 DIAGNOSIS — R400 Somnolence: Secondary | ICD-10-CM | POA: Diagnosis present

## 2022-08-07 DIAGNOSIS — Z79899 Other long term (current) drug therapy: Secondary | ICD-10-CM

## 2022-08-07 DIAGNOSIS — A419 Sepsis, unspecified organism: Principal | ICD-10-CM | POA: Diagnosis present

## 2022-08-07 DIAGNOSIS — Z923 Personal history of irradiation: Secondary | ICD-10-CM

## 2022-08-07 DIAGNOSIS — Z90710 Acquired absence of both cervix and uterus: Secondary | ICD-10-CM | POA: Diagnosis not present

## 2022-08-07 DIAGNOSIS — Z515 Encounter for palliative care: Secondary | ICD-10-CM

## 2022-08-07 DIAGNOSIS — R296 Repeated falls: Secondary | ICD-10-CM | POA: Diagnosis present

## 2022-08-07 DIAGNOSIS — R509 Fever, unspecified: Secondary | ICD-10-CM | POA: Diagnosis not present

## 2022-08-07 DIAGNOSIS — K219 Gastro-esophageal reflux disease without esophagitis: Secondary | ICD-10-CM | POA: Diagnosis present

## 2022-08-07 DIAGNOSIS — I5033 Acute on chronic diastolic (congestive) heart failure: Secondary | ICD-10-CM | POA: Diagnosis present

## 2022-08-07 DIAGNOSIS — W07XXXA Fall from chair, initial encounter: Secondary | ICD-10-CM | POA: Diagnosis present

## 2022-08-07 DIAGNOSIS — E785 Hyperlipidemia, unspecified: Secondary | ICD-10-CM | POA: Diagnosis present

## 2022-08-07 DIAGNOSIS — Z950 Presence of cardiac pacemaker: Secondary | ICD-10-CM

## 2022-08-07 DIAGNOSIS — J9601 Acute respiratory failure with hypoxia: Principal | ICD-10-CM

## 2022-08-07 DIAGNOSIS — J9621 Acute and chronic respiratory failure with hypoxia: Secondary | ICD-10-CM | POA: Diagnosis present

## 2022-08-07 DIAGNOSIS — G20A1 Parkinson's disease without dyskinesia, without mention of fluctuations: Secondary | ICD-10-CM | POA: Diagnosis present

## 2022-08-07 DIAGNOSIS — Z1152 Encounter for screening for COVID-19: Secondary | ICD-10-CM

## 2022-08-07 DIAGNOSIS — I2510 Atherosclerotic heart disease of native coronary artery without angina pectoris: Secondary | ICD-10-CM | POA: Diagnosis present

## 2022-08-07 DIAGNOSIS — R6521 Severe sepsis with septic shock: Secondary | ICD-10-CM | POA: Diagnosis present

## 2022-08-07 DIAGNOSIS — Y92129 Unspecified place in nursing home as the place of occurrence of the external cause: Secondary | ICD-10-CM

## 2022-08-07 DIAGNOSIS — J9622 Acute and chronic respiratory failure with hypercapnia: Secondary | ICD-10-CM | POA: Diagnosis not present

## 2022-08-07 DIAGNOSIS — Z7984 Long term (current) use of oral hypoglycemic drugs: Secondary | ICD-10-CM

## 2022-08-07 DIAGNOSIS — Z833 Family history of diabetes mellitus: Secondary | ICD-10-CM

## 2022-08-07 DIAGNOSIS — M1990 Unspecified osteoarthritis, unspecified site: Secondary | ICD-10-CM | POA: Diagnosis present

## 2022-08-07 DIAGNOSIS — Z87892 Personal history of anaphylaxis: Secondary | ICD-10-CM

## 2022-08-07 DIAGNOSIS — N179 Acute kidney failure, unspecified: Secondary | ICD-10-CM | POA: Diagnosis present

## 2022-08-07 DIAGNOSIS — S0003XA Contusion of scalp, initial encounter: Secondary | ICD-10-CM | POA: Diagnosis present

## 2022-08-07 DIAGNOSIS — I252 Old myocardial infarction: Secondary | ICD-10-CM

## 2022-08-07 DIAGNOSIS — J189 Pneumonia, unspecified organism: Secondary | ICD-10-CM | POA: Diagnosis present

## 2022-08-07 DIAGNOSIS — F419 Anxiety disorder, unspecified: Secondary | ICD-10-CM | POA: Diagnosis present

## 2022-08-07 DIAGNOSIS — Z66 Do not resuscitate: Secondary | ICD-10-CM | POA: Diagnosis present

## 2022-08-07 DIAGNOSIS — Z880 Allergy status to penicillin: Secondary | ICD-10-CM

## 2022-08-07 DIAGNOSIS — Z8701 Personal history of pneumonia (recurrent): Secondary | ICD-10-CM

## 2022-08-07 DIAGNOSIS — Z8249 Family history of ischemic heart disease and other diseases of the circulatory system: Secondary | ICD-10-CM

## 2022-08-07 DIAGNOSIS — Z951 Presence of aortocoronary bypass graft: Secondary | ICD-10-CM

## 2022-08-07 DIAGNOSIS — Z79891 Long term (current) use of opiate analgesic: Secondary | ICD-10-CM

## 2022-08-07 LAB — COMPREHENSIVE METABOLIC PANEL
ALT: 39 U/L (ref 0–44)
AST: 72 U/L — ABNORMAL HIGH (ref 15–41)
Albumin: 2.4 g/dL — ABNORMAL LOW (ref 3.5–5.0)
Alkaline Phosphatase: 43 U/L (ref 38–126)
Anion gap: 10 (ref 5–15)
BUN: 42 mg/dL — ABNORMAL HIGH (ref 8–23)
CO2: 25 mmol/L (ref 22–32)
Calcium: 7.9 mg/dL — ABNORMAL LOW (ref 8.9–10.3)
Chloride: 98 mmol/L (ref 98–111)
Creatinine, Ser: 1.82 mg/dL — ABNORMAL HIGH (ref 0.44–1.00)
GFR, Estimated: 27 mL/min — ABNORMAL LOW (ref >60)
Glucose, Bld: 161 mg/dL — ABNORMAL HIGH (ref 70–99)
Potassium: 3.9 mmol/L (ref 3.5–5.1)
Sodium: 133 mmol/L — ABNORMAL LOW (ref 135–145)
Total Bilirubin: 1.1 mg/dL (ref 0.3–1.2)
Total Protein: 5.4 g/dL — ABNORMAL LOW (ref 6.5–8.1)

## 2022-08-07 LAB — CBC WITH DIFFERENTIAL/PLATELET
Abs Immature Granulocytes: 0.16 10*3/uL — ABNORMAL HIGH (ref 0.00–0.07)
Basophils Absolute: 0 10*3/uL (ref 0.0–0.1)
Basophils Relative: 1 %
Eosinophils Absolute: 0 10*3/uL (ref 0.0–0.5)
Eosinophils Relative: 0 %
HCT: 24.8 % — ABNORMAL LOW (ref 36.0–46.0)
Hemoglobin: 8 g/dL — ABNORMAL LOW (ref 12.0–15.0)
Immature Granulocytes: 10 %
Lymphocytes Relative: 34 %
Lymphs Abs: 0.5 10*3/uL — ABNORMAL LOW (ref 0.7–4.0)
MCH: 30.1 pg (ref 26.0–34.0)
MCHC: 32.3 g/dL (ref 30.0–36.0)
MCV: 93.2 fL (ref 80.0–100.0)
Monocytes Absolute: 0.1 10*3/uL (ref 0.1–1.0)
Monocytes Relative: 7 %
Neutro Abs: 0.8 10*3/uL — ABNORMAL LOW (ref 1.7–7.7)
Neutrophils Relative %: 48 %
Platelets: 139 10*3/uL — ABNORMAL LOW (ref 150–400)
RBC: 2.66 MIL/uL — ABNORMAL LOW (ref 3.87–5.11)
RDW: 14.6 % (ref 11.5–15.5)
Smear Review: NORMAL
WBC: 1.5 10*3/uL — ABNORMAL LOW (ref 4.0–10.5)
nRBC: 0 % (ref 0.0–0.2)

## 2022-08-07 LAB — LACTIC ACID, PLASMA
Lactic Acid, Venous: 2 mmol/L (ref 0.5–1.9)
Lactic Acid, Venous: 3.9 mmol/L (ref 0.5–1.9)

## 2022-08-07 LAB — GLUCOSE, CAPILLARY
Glucose-Capillary: 173 mg/dL — ABNORMAL HIGH (ref 70–99)
Glucose-Capillary: 357 mg/dL — ABNORMAL HIGH (ref 70–99)
Glucose-Capillary: 343 mg/dL — ABNORMAL HIGH (ref 70–99)
Glucose-Capillary: 278 mg/dL — ABNORMAL HIGH (ref 70–99)

## 2022-08-07 LAB — URINALYSIS, COMPLETE (UACMP) WITH MICROSCOPIC
Bacteria, UA: NONE SEEN
Bilirubin Urine: NEGATIVE
Glucose, UA: NEGATIVE mg/dL
Ketones, ur: NEGATIVE mg/dL
Leukocytes,Ua: NEGATIVE
Nitrite: NEGATIVE
Protein, ur: 30 mg/dL — AB
Specific Gravity, Urine: 1.015 (ref 1.005–1.030)
Squamous Epithelial / HPF: NONE SEEN /HPF (ref 0–5)
pH: 5 (ref 5.0–8.0)

## 2022-08-07 LAB — BLOOD GAS, ARTERIAL
Acid-base deficit: 0.2 mmol/L (ref 0.0–2.0)
Bicarbonate: 25.9 mmol/L (ref 20.0–28.0)
MECHVT: 325 mL
O2 Saturation: 87.1 %
PEEP: 10 cmH2O
Patient temperature: 39.1
RATE: 16 resp/min
pCO2 arterial: 52 mmHg — ABNORMAL HIGH (ref 32–48)
pH, Arterial: 7.32 — ABNORMAL LOW (ref 7.35–7.45)
pO2, Arterial: 65 mmHg — ABNORMAL LOW (ref 83–108)

## 2022-08-07 LAB — PROCALCITONIN: Procalcitonin: >150 ng/mL

## 2022-08-07 LAB — PROTIME-INR
INR: 1.7 — ABNORMAL HIGH (ref 0.8–1.2)
Prothrombin Time: 20 seconds — ABNORMAL HIGH (ref 11.4–15.2)

## 2022-08-07 LAB — RESP PANEL BY RT-PCR (RSV, FLU A&B, COVID)  RVPGX2
Influenza A by PCR: NEGATIVE
Influenza B by PCR: NEGATIVE
Resp Syncytial Virus by PCR: NEGATIVE
SARS Coronavirus 2 by RT PCR: NEGATIVE

## 2022-08-07 LAB — TROPONIN I (HIGH SENSITIVITY)
Troponin I (High Sensitivity): 25 ng/L — ABNORMAL HIGH (ref <18)
Troponin I (High Sensitivity): 51 ng/L — ABNORMAL HIGH (ref <18)

## 2022-08-07 LAB — D-DIMER, QUANTITATIVE: D-Dimer, Quant: 5.38 ug/mL-FEU — ABNORMAL HIGH (ref 0.00–0.50)

## 2022-08-07 LAB — CULTURE, RESPIRATORY W GRAM STAIN

## 2022-08-07 LAB — APTT: aPTT: 47 seconds — ABNORMAL HIGH (ref 24–36)

## 2022-08-07 LAB — BRAIN NATRIURETIC PEPTIDE: B Natriuretic Peptide: 268.4 pg/mL — ABNORMAL HIGH (ref 0.0–100.0)

## 2022-08-07 LAB — MRSA NEXT GEN BY PCR, NASAL: MRSA by PCR Next Gen: DETECTED — AB

## 2022-08-07 LAB — STREP PNEUMONIAE URINARY ANTIGEN: Strep Pneumo Urinary Antigen: NEGATIVE

## 2022-08-07 MED ORDER — PROPOFOL 1000 MG/100ML IV EMUL
INTRAVENOUS | Status: AC
  Administered 2022-08-07: 10 ug/kg/min via INTRAVENOUS
  Filled 2022-08-07: qty 100

## 2022-08-07 MED ORDER — SODIUM CHLORIDE 0.9 % IV SOLN
250.0000 mL | INTRAVENOUS | Status: DC
  Administered 2022-08-07: 250 mL via INTRAVENOUS

## 2022-08-07 MED ORDER — METHYLPREDNISOLONE SODIUM SUCC 125 MG IJ SOLR
125.0000 mg | Freq: Once | INTRAMUSCULAR | Status: AC
  Administered 2022-08-07: 125 mg via INTRAVENOUS
  Filled 2022-08-07: qty 2

## 2022-08-07 MED ORDER — IPRATROPIUM-ALBUTEROL 0.5-2.5 (3) MG/3ML IN SOLN
3.0000 mL | Freq: Four times a day (QID) | RESPIRATORY_TRACT | Status: DC
  Administered 2022-08-07 – 2022-08-08 (×5): 3 mL via RESPIRATORY_TRACT
  Filled 2022-08-07 (×5): qty 3

## 2022-08-07 MED ORDER — INSULIN ASPART 100 UNIT/ML IJ SOLN: 0.0000 [IU] | Freq: Every day | INTRAMUSCULAR | Status: DC

## 2022-08-07 MED ORDER — NOREPINEPHRINE 4 MG/250ML-% IV SOLN: 0.0000 ug/min | INTRAVENOUS | Status: DC

## 2022-08-07 MED ORDER — POLYETHYLENE GLYCOL 3350 17 G PO PACK: 17.0000 g | PACK | Freq: Every day | ORAL | Status: DC | PRN

## 2022-08-07 MED ORDER — MIDAZOLAM-SODIUM CHLORIDE 100-0.9 MG/100ML-% IV SOLN
0.5000 mg/h | INTRAVENOUS | Status: DC
  Administered 2022-08-07: 2 mg/h via INTRAVENOUS

## 2022-08-07 MED ORDER — MUPIROCIN 2 % EX OINT
TOPICAL_OINTMENT | Freq: Two times a day (BID) | CUTANEOUS | Status: DC
  Administered 2022-08-07: 1 via NASAL
  Filled 2022-08-07: qty 22

## 2022-08-07 MED ORDER — PIPERACILLIN-TAZOBACTAM 3.375 G IVPB
3.3750 g | Freq: Three times a day (TID) | INTRAVENOUS | Status: DC
  Administered 2022-08-07 – 2022-08-08 (×3): 3.375 g via INTRAVENOUS
  Filled 2022-08-07 (×3): qty 50

## 2022-08-07 MED ORDER — NOREPINEPHRINE 16 MG/250ML-% IV SOLN
0.0000 ug/min | INTRAVENOUS | Status: DC
  Administered 2022-08-07: 35 ug/min via INTRAVENOUS
  Administered 2022-08-07: 38 ug/min via INTRAVENOUS
  Administered 2022-08-08: 40 ug/min via INTRAVENOUS
  Filled 2022-08-07 (×4): qty 250

## 2022-08-07 MED ORDER — ACETAMINOPHEN 650 MG RE SUPP
650.0000 mg | RECTAL | Status: DC | PRN
  Administered 2022-08-07: 650 mg via RECTAL

## 2022-08-07 MED ORDER — ACETAMINOPHEN 10 MG/ML IV SOLN
1000.0000 mg | Freq: Four times a day (QID) | INTRAVENOUS | Status: DC
  Administered 2022-08-07 – 2022-08-08 (×2): 1000 mg via INTRAVENOUS
  Filled 2022-08-07 (×3): qty 100

## 2022-08-07 MED ORDER — VANCOMYCIN VARIABLE DOSE PER UNSTABLE RENAL FUNCTION (PHARMACIST DOSING): Status: DC

## 2022-08-07 MED ORDER — DOCUSATE SODIUM 50 MG/5ML PO LIQD: 100.0000 mg | Freq: Two times a day (BID) | ORAL | Status: DC | PRN

## 2022-08-07 MED ORDER — PROPOFOL 1000 MG/100ML IV EMUL
5.0000 ug/kg/min | INTRAVENOUS | Status: DC
  Administered 2022-08-07: 30 ug/kg/min via INTRAVENOUS
  Administered 2022-08-07: 35 ug/kg/min via INTRAVENOUS
  Filled 2022-08-07 (×3): qty 100

## 2022-08-07 MED ORDER — SODIUM CHLORIDE 0.9 % IV SOLN
1.0000 g | Freq: Once | INTRAVENOUS | Status: AC
  Administered 2022-08-07: 1 g via INTRAVENOUS
  Filled 2022-08-07: qty 10

## 2022-08-07 MED ORDER — ACETAMINOPHEN 325 MG RE SUPP
975.0000 mg | Freq: Once | RECTAL | Status: AC
  Administered 2022-08-07: 975 mg via RECTAL
  Filled 2022-08-07: qty 3

## 2022-08-07 MED ORDER — ACETAMINOPHEN 650 MG RE SUPP
RECTAL | Status: AC
  Filled 2022-08-07: qty 1

## 2022-08-07 MED ORDER — METRONIDAZOLE 500 MG/100ML IV SOLN
500.0000 mg | Freq: Once | INTRAVENOUS | Status: AC
  Administered 2022-08-07: 500 mg via INTRAVENOUS
  Filled 2022-08-07: qty 100

## 2022-08-07 MED ORDER — POLYETHYLENE GLYCOL 3350 17 G PO PACK: 17.0000 g | PACK | Freq: Every day | ORAL | Status: DC

## 2022-08-07 MED ORDER — FENTANYL 2500MCG IN NS 250ML (10MCG/ML) PREMIX INFUSION
25.0000 ug/h | INTRAVENOUS | Status: DC
  Administered 2022-08-07: 50 ug/h via INTRAVENOUS
  Administered 2022-08-08: 100 ug/h via INTRAVENOUS
  Filled 2022-08-07 (×2): qty 250

## 2022-08-07 MED ORDER — INSULIN ASPART 100 UNIT/ML IJ SOLN: 0.0000 [IU] | Freq: Three times a day (TID) | INTRAMUSCULAR | Status: DC

## 2022-08-07 MED ORDER — INSULIN ASPART 100 UNIT/ML IJ SOLN
0.0000 [IU] | INTRAMUSCULAR | Status: DC
  Administered 2022-08-07: 15 [IU] via SUBCUTANEOUS
  Administered 2022-08-08: 3 [IU] via SUBCUTANEOUS
  Administered 2022-08-08: 11 [IU] via SUBCUTANEOUS
  Filled 2022-08-07 (×3): qty 1

## 2022-08-07 MED ORDER — LACTATED RINGERS IV BOLUS
1000.0000 mL | Freq: Once | INTRAVENOUS | Status: AC
  Administered 2022-08-07: 1000 mL via INTRAVENOUS

## 2022-08-07 MED ORDER — INSULIN ASPART 100 UNIT/ML IJ SOLN
0.0000 [IU] | INTRAMUSCULAR | Status: DC
  Filled 2022-08-07: qty 1

## 2022-08-07 MED ORDER — VANCOMYCIN HCL IN DEXTROSE 1-5 GM/200ML-% IV SOLN: 1000.0000 mg | Freq: Once | INTRAVENOUS | Status: DC

## 2022-08-07 MED ORDER — CHLORHEXIDINE GLUCONATE CLOTH 2 % EX PADS
6.0000 | MEDICATED_PAD | Freq: Every day | CUTANEOUS | Status: DC
  Administered 2022-08-07: 6 via TOPICAL

## 2022-08-07 MED ORDER — NOREPINEPHRINE 4 MG/250ML-% IV SOLN
INTRAVENOUS | Status: AC
  Administered 2022-08-07: 15 ug/min via INTRAVENOUS
  Filled 2022-08-07: qty 250

## 2022-08-07 MED ORDER — HYDROCORTISONE SOD SUC (PF) 100 MG IJ SOLR
100.0000 mg | Freq: Two times a day (BID) | INTRAMUSCULAR | Status: DC
  Administered 2022-08-07: 100 mg via INTRAVENOUS
  Filled 2022-08-07: qty 2

## 2022-08-07 MED ORDER — NOREPINEPHRINE 4 MG/250ML-% IV SOLN
2.0000 ug/min | INTRAVENOUS | Status: DC
  Filled 2022-08-07 (×2): qty 250

## 2022-08-07 MED ORDER — HEPARIN SODIUM (PORCINE) 5000 UNIT/ML IJ SOLN: 5000.0000 [IU] | Freq: Three times a day (TID) | INTRAMUSCULAR | Status: DC

## 2022-08-07 MED ORDER — ORAL CARE MOUTH RINSE: 15.0000 mL | OROMUCOSAL | Status: DC | PRN

## 2022-08-07 MED ORDER — PANTOPRAZOLE SODIUM 40 MG IV SOLR
40.0000 mg | INTRAVENOUS | Status: DC
  Administered 2022-08-07: 40 mg via INTRAVENOUS
  Filled 2022-08-07: qty 10

## 2022-08-07 MED ORDER — FENTANYL BOLUS VIA INFUSION
25.0000 ug | INTRAVENOUS | Status: DC | PRN
  Administered 2022-08-07: 50 ug via INTRAVENOUS

## 2022-08-07 MED ORDER — VANCOMYCIN HCL 1500 MG/300ML IV SOLN
1500.0000 mg | Freq: Once | INTRAVENOUS | Status: AC
  Administered 2022-08-07: 1500 mg via INTRAVENOUS
  Filled 2022-08-07: qty 300

## 2022-08-07 MED ORDER — ORAL CARE MOUTH RINSE
15.0000 mL | OROMUCOSAL | Status: DC
  Administered 2022-08-07 – 2022-08-08 (×11): 15 mL via OROMUCOSAL

## 2022-08-07 MED ORDER — MIDAZOLAM-SODIUM CHLORIDE 100-0.9 MG/100ML-% IV SOLN
INTRAVENOUS | Status: AC
  Filled 2022-08-07: qty 100

## 2022-08-07 MED ORDER — LACTATED RINGERS IV BOLUS (SEPSIS)
1000.0000 mL | Freq: Once | INTRAVENOUS | Status: AC
  Administered 2022-08-07: 1000 mL via INTRAVENOUS

## 2022-08-07 MED ORDER — SUCCINYLCHOLINE CHLORIDE 200 MG/10ML IV SOSY
150.0000 mg | PREFILLED_SYRINGE | Freq: Once | INTRAVENOUS | Status: AC
  Administered 2022-08-07: 150 mg via INTRAVENOUS

## 2022-08-07 MED ORDER — VASOPRESSIN 20 UNITS/100 ML INFUSION FOR SHOCK
0.0000 [IU]/min | INTRAVENOUS | Status: DC
  Administered 2022-08-07 – 2022-08-08 (×2): 0.03 [IU]/min via INTRAVENOUS
  Filled 2022-08-07 (×3): qty 100

## 2022-08-07 MED ORDER — DOCUSATE SODIUM 50 MG/5ML PO LIQD: 100.0000 mg | Freq: Two times a day (BID) | ORAL | Status: DC

## 2022-08-07 MED ORDER — ETOMIDATE 2 MG/ML IV SOLN
20.0000 mg | Freq: Once | INTRAVENOUS | Status: AC
  Administered 2022-08-07: 20 mg via INTRAVENOUS

## 2022-08-07 MED ORDER — SODIUM CHLORIDE 0.9 % IV SOLN: 2.0000 g | INTRAVENOUS | Status: DC

## 2022-08-07 MED ORDER — SODIUM CHLORIDE 0.9 % IV SOLN: 500.0000 mg | INTRAVENOUS | Status: DC

## 2022-08-07 NOTE — IPAL (Signed)
GOALS OF CARE FAMILY CONFERENCE   Current clinical status, hospital findings and medical plan was reviewed with family.   Updated and notified of patients ongoing immediate critical medical problems.   Patient remains unresponsive acutely comatose    Patient is unable to breathe independently, unable to protect airway and unable to mobilize secretions.    Explained to family course of therapy and the modalities   Patient with Progressive multiorgan failure with high probability of a very minimal chance of meaningful recovery despite aggressive and optimal medical therapy.   Family is appreciative of care and relate understanding that patient is severely critically ill with anticipation of passing away during this hospitalization.   They have consented and agreed to DNR/DNI  Code status   Family are satisfied with Plan of action and management. All questions answered  Additional Critical Care time 35 mins    Jacci Ruberg, M.D.  Pulmonary & Critical Care Medicine  [facility]   

## 2022-08-07 NOTE — Progress Notes (Signed)
CODE SEPSIS - PHARMACY COMMUNICATION  **Broad Spectrum Antibiotics should be administered within 1 hour of Sepsis diagnosis**  Time Code Sepsis Called/Page Received: 1610  Antibiotics Ordered: Ceftriaxone, Flagyl, Vancomycin  Time of 1st antibiotic administration: 9604  Otelia Sergeant, PharmD, [facility] 08/07/2022 6:56 AM

## 2022-08-07 NOTE — Consult Note (Addendum)
Pharmacy Antibiotic Note  Kristi [name] is a 82 y.o. female admitted on 08/07/2022 with pneumonia. PMH significant for GERD, CAD, Parkinson's, CVA (2018), HTN, HLD, T2DM, CHF, AF, depression. Patient has documented allergy to penicillins (2011) but tolerates ceftriaxone. Per discussion with provider, we will proceed cautiously with a trial of Zosyn given remote nature of this reaction. Pharmacy has been consulted for vancomycin dosing.  Plan: Day 1 of antibiotics Give vancomycin 1500 mg IV x1. Check random vanc level tomorrow given AKI (SCr 1.82, baseline ~1 on 08/01/22)  Ceftriaxone 2 g IV Q24H and azithromycin 500 mg IV Q24H have been discontinued Start Zosyn 3.375 g IV Q8H Continue to monitor renal function and follow culture results   Weight: 66.7 kg (147 lb 0.8 oz)  Temp (24hrs), Avg:101.5 F (38.6 C), Min:97.3 F (36.3 C), Max:102.5 F (39.2 C)  Recent Labs  Lab 08/01/22 1033 08/07/22 0621 08/07/22 0622  WBC 4.8  --  1.5*  CREATININE 1.02*  --  1.82*  LATICACIDVEN  --  2.0*  --     Estimated Creatinine Clearance: 22.4 mL/min (A) (by C-G formula based on SCr of 1.82 mg/dL (H)).    Allergies  Allergen Reactions   Penicillins Anaphylaxis, Swelling and Rash    TOLERATES CEFTRIAXONE    Antimicrobials this admission: 5/19 Ceftriaxone x1 5/19 Vancomycin >>  5/19 Zosyn >>  Dose adjustments this admission: N/A  Microbiology results: 5/19 BCx: IP 5/19 UCx: IP  5/19 Sputum: IP  5/19 MRSA PCR: IP  Thank you for allowing pharmacy to be a part of this patient's care.  Celene Squibb, PharmD PGY1 Pharmacy Resident 08/07/2022 8:10 AM

## 2022-08-07 NOTE — ED Provider Notes (Signed)
[facility] Provider Note    Event Date/Time   First MD Initiated Contact with Patient 08/07/22 [id number]     (approximate)   History   Respiratory Distress ([place] [facility], C/o repsiatory distress with grunting. PNA dx on fri, EMS noted BP  60/40 to 94/50, BVM to assist on arrival to [facility])   HPI  Level V caveat: Limited by decreased LOC  Kristi [name] is a 82 y.o. female brought to the ED via EMS from SNF with a chief complaint of respiratory distress with hypoxia.  Staff reports patient recently diagnosed with pneumonia this week.  Staff administered IM Lasix prior to sending patient to the emergency department.  EMS reports room air saturation in the 60%'s, arrives to the ED treatment room being bagged.  Blood pressure in the 60s.  Rest of history is unobtainable secondary to patient's distress.     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   Cervical disc disorder    CHF (congestive heart failure) (HCC)    Depression    Diabetes mellitus without complication (HCC)    GERD (gastroesophageal reflux disease)    Headache    Hyperlipidemia    Hypertension    Hypothyroid    Myocardial infarction (HCC)    15 year ago   Neck pain 06/04/2014   Parkinson's disease    Presence of permanent cardiac pacemaker    Spinal stenosis      Active Problem List   Patient Active Problem List   Diagnosis Date Noted   Acute on chronic respiratory failure with hypoxia and hypercapnia (HCC) 08/07/2022   Normocytic anemia 04/14/2022   Rectal pain 02/16/2021   UTI (urinary tract infection) 06/14/2020   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   Atrial fibrillation, chronic (HCC) 06/14/2020   Acute renal failure superimposed on stage 3b chronic kidney disease  (HCC) 06/14/2020   Lactic acidosis 06/14/2020   Diarrhea    Myoclonus    Weakness    AKI (acute kidney injury) (HCC) 05/16/2020   Transaminitis 05/16/2020   GI bleed 01/28/2020   Rectal prolapse 01/28/2020   At risk for polypharmacy 01/28/2020   Acute delirium    HCAP (healthcare-associated pneumonia)    Hypotension    Hyponatremia    Lobar pneumonia (HCC) 10/23/2019   Atrial flutter (HCC)    Dizziness    Atrial fibrillation status post cardioversion (HCC) 09/27/2019   CHF (congestive heart failure) (HCC) 09/26/2019   Chronic diastolic CHF (congestive heart failure) (HCC) 09/25/2019   Hypertensive urgency 09/25/2019   Fluid overload 09/25/2019   Excessive gas 05/14/2019   Generalized bloating 05/14/2019   Bright red blood per rectum 05/14/2019   Major depressive disorder, 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   Chest pain 11/20/2018   Difficulty walking 11/19/2018   Insomnia 11/15/2018   Type 2 diabetes mellitus with hypoglycemia without coma, with long-term current use of insulin (HCC) 11/15/2018   Goals of care, counseling/discussion 02/06/2018   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   Rectal mass 01/05/2018   Leg pain, bilateral 12/06/2017   Lymphedema 07/10/2017   Atherosclerosis of native arteries of the extremities with ulceration (HCC) 07/10/2017  Stricture and stenosis of esophagus    Dysphagia    Syncope and collapse 04/11/2017   Mild dementia (HCC) 01/24/2017   Lumbar facet osteoarthritis (Bilateral) 01/17/2017   Acute postoperative pain 01/17/2017   Hypokalemia 12/24/2016   Anxiety 12/07/2016   Cardiac pacemaker in situ 12/07/2016   Cerebrovascular accident (HCC) 12/07/2016   Chronic depression 12/07/2016   Essential hypertension 12/07/2016   Hypothyroidism 12/07/2016   Mixed hyperlipidemia 12/07/2016   Myocardial infarction (HCC) 12/07/2016   TIA (transient ischemic attack) 11/17/2016   Parkinson disease 10/27/2016   Tremor  10/27/2016   Chronic knee pain (B) (R>L) 10/19/2016   Ischemic chest pain (HCC) 09/29/2016   Abnormal CT scan, lumbar spine 09/08/2016   Lumbar foraminal stenosis (multilevel) 09/08/2016   Fracture of clavicle 08/03/2016   Polyneuropathy 05/17/2016   Neurogenic pain 04/12/2016   Chronic lower extremity pain (Bilateral) (R>L) 04/12/2016   Chronic lower extremity radicular pain (Primary Source of Pain) (Bilateral) (R>L) 04/12/2016   Lower extremity weakness 04/12/2016   Chronic low back pain (Secondary source of pain) (Bilateral) (R>L) 04/12/2016   Chronic wrist pain (Left) (secondary to fracture) 04/12/2016   Lumbar spondylosis 04/12/2016   Chronic pain syndrome 04/11/2016   Long term current use of opiate analgesic 04/11/2016   Long term prescription opiate use 04/11/2016   Opiate use 04/11/2016   Long term prescription benzodiazepine use 04/11/2016   Lumbar radiculopathy (Multilevel) (Bilateral) 05/07/2015   Lumbar facet syndrome (Bilateral) (R>L) 08/21/2014   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, L3-4, L4-5) 08/13/2014   Sacroiliac joint dysfunction (Bilateral) 08/13/2014   Frequent falls 06/04/2014   Memory loss 06/04/2014   Anxiety and depression 08/22/2013   CAD (coronary artery disease) 08/22/2013   Therapeutic opioid-induced constipation (OIC) 08/22/2013   GERD (gastroesophageal reflux disease) 08/22/2013     Past Surgical History   Past Surgical History:  Procedure Laterality Date   ABDOMINAL HYSTERECTOMY     partial   BREAST SURGERY     CARDIOVERSION N/A 10/24/2019   Procedure: CARDIOVERSION;  Surgeon: Iran Ouch, MD;  Location: [facility] ORS;  Service: Cardiovascular;  Laterality: N/A;   COLONOSCOPY     COLONOSCOPY N/A 01/30/2020   Procedure: COLONOSCOPY;  Surgeon: Toledo, Boykin Nearing, MD;  Location: [facility] ENDOSCOPY;  Service: Gastroenterology;  Laterality: N/A;   COLONOSCOPY WITH PROPOFOL N/A 11/29/2017   Procedure: COLONOSCOPY WITH PROPOFOL;  Surgeon:  Scot Jun, MD;  Location: [facility] ENDOSCOPY;  Service: Endoscopy;  Laterality: N/A;   CORONARY ANGIOPLASTY     stent   CORONARY ARTERY BYPASS GRAFT     CORONARY STENT PLACEMENT     ESOPHAGOGASTRODUODENOSCOPY (EGD) WITH PROPOFOL N/A 04/18/2017   Procedure: ESOPHAGOGASTRODUODENOSCOPY (EGD) WITH PROPOFOL;  Surgeon: Midge Minium, MD;  Location: [facility] ENDOSCOPY;  Service: Endoscopy;  Laterality: N/A;   ESOPHAGOGASTRODUODENOSCOPY (EGD) WITH PROPOFOL N/A 11/29/2017   Procedure: ESOPHAGOGASTRODUODENOSCOPY (EGD) WITH PROPOFOL;  Surgeon: Scot Jun, MD;  Location: [facility] ENDOSCOPY;  Service: Endoscopy;  Laterality: N/A;   FLEXIBLE SIGMOIDOSCOPY N/A 05/17/2020   Procedure: FLEXIBLE SIGMOIDOSCOPY;  Surgeon: Toney Reil, MD;  Location: [facility] ENDOSCOPY;  Service: Gastroenterology;  Laterality: N/A;   INSERT / REPLACE / REMOVE PACEMAKER     PPM GENERATOR CHANGEOUT N/A 09/05/2019   Procedure: PPM GENERATOR CHANGEOUT;  Surgeon: Duke Salvia, MD;  Location: [facility] INVASIVE CV LAB;  Service: Cardiovascular;  Laterality: N/A;   s/p pacer insertion     TRANSANAL EXCISION OF RECTAL MASS N/A 01/17/2018   Procedure: TRANSANAL EXCISION OF RECTAL POLYP;  Surgeon: Earline Mayotte, MD;  Location: [facility] ORS;  Service: General;  Laterality: N/A;     Home Medications   Prior to Admission medications   Medication Sig Start Date End Date Taking? Authorizing Provider  amiodarone (PACERONE) 200 MG tablet Take 100 mg by mouth daily. 07/24/21   [provider]  apixaban (ELIQUIS) 5 MG TABS tablet Take 1 tablet (5 mg total) by mouth 2 (two) times daily. 06/19/20   Narda Bonds, MD  atorvastatin (LIPITOR) 10 MG tablet Take 10 mg by mouth daily.    [provider]  busPIRone (BUSPAR) 5 MG tablet 2 (two) times daily 03/05/21   [provider]  donepezil (ARICEPT) 10 MG tablet Take 10 mg by mouth daily.     [provider]  Emollient (AQUAPHOR ADV THERAPY HEALING) 41 % OINT Apply  topically. Apply to L heel topically as needed for dry skin    [provider]  Ferrous Sulfate (SLOW FE) 142 (45 Fe) MG TBCR Take 1 tablet by mouth twice daily 11/19/19   Duke Salvia, MD  gabapentin (NEURONTIN) 600 MG tablet Take 600 mg by mouth daily. 04/17/21   [provider]  HYDROcodone-acetaminophen (NORCO/VICODIN) 5-325 MG tablet Take 1 tablet by mouth 2 (two) times daily as needed. Patient not taking: Reported on 04/14/2022 06/11/21   [provider]  hydrOXYzine (ATARAX) 50 MG tablet Take 50 mg by mouth 2 (two) times daily. 07/20/21   [provider]  Levothyroxine Sodium 175 MCG CAPS Take 150 mcg by mouth daily. 07/27/21   [provider]  linagliptin (TRADJENTA) 5 MG TABS tablet Take 1 tablet (5 mg total) by mouth daily. 05/21/20   Alford Highland, MD  memantine (NAMENDA) 5 MG tablet Take 5 mg by mouth at bedtime. 05/04/20   [provider]  metoprolol succinate (TOPROL XL) 25 MG 24 hr tablet Take 0.5 tablets (12.5 mg total) by mouth daily. 06/19/20 04/14/22  Narda Bonds, MD  MIRALAX 17 GM/SCOOP powder  06/09/21   [provider]  mirtazapine (REMERON) 7.5 MG tablet Take 7.5 mg by mouth at bedtime.    [provider]  montelukast (SINGULAIR) 10 MG tablet Take 10 mg by mouth at bedtime.  07/23/19   [provider]  nitroGLYCERIN (NITROSTAT) 0.4 MG SL tablet Place 0.4 mg under the tongue every 5 (five) minutes as needed for chest pain.  Patient not taking: Reported on 03/18/2021    [provider]  omeprazole (PRILOSEC) 20 MG capsule Take 20 mg by mouth at bedtime. 06/09/21   [provider]  oxyCODONE-acetaminophen (PERCOCET/ROXICET) 5-325 MG tablet Take 1 tablet by mouth 4 (four) times daily as needed.    [provider]  potassium chloride (KLOR-CON) 20 MEQ packet Take 20 mEq by mouth 2 (two) times daily. 02/15/21   [provider]  rOPINIRole (REQUIP XL) 4 MG 24 hr tablet Take 4  mg by mouth daily. 01/28/21   [provider]  sertraline (ZOLOFT) 100 MG tablet Take 100 mg by mouth 2 (two) times daily. 11/19/18 04/24/30  [provider]  sucralfate (CARAFATE) 1 GM/10ML suspension SMARTSIG:Milliliter(s) By Mouth 03/08/21   [provider]  SYSTANE ULTRA PF 0.4-0.3 % SOLN Apply to eye. 03/23/21   [provider]  torsemide (DEMADEX) 20 MG tablet Take 2 tablets (40 mg total) by mouth 2 (two) times daily. 04/24/20   Debbe Odea, MD  traMADol (ULTRAM) 50 MG tablet SMARTSIG:0.5 Tablet(s) By Mouth Every 4  Hours PRN Patient not taking: Reported on 04/14/2022 04/20/21   [provider]  traZODone (DESYREL) 100 MG tablet Take 1 tablet (100 mg total) by mouth at bedtime. 10/28/19   Alford Highland, MD     Allergies  Penicillins   Family History   Family History  Problem Relation Age of Onset   Cancer Mother    Depression Mother    Diabetes Mother    Hypertension Mother    Aneurysm Mother    Heart disease Father    Diabetes Sister    Diabetes Brother    Diabetes Brother      Physical Exam  Triage Vital Signs: ED Triage Vitals  Enc Vitals Group     BP 08/07/22 0621 (!) 74/45     Pulse Rate 08/07/22 0619 (!) 105     Resp 08/07/22 0621 (!) 25     Temp --      Temp src --      SpO2 08/07/22 0619 99 %     Weight --      Height --      Head Circumference --      Peak Flow --      Pain Score --      Pain Loc --      Pain Edu? --      Excl. in GC? --     Updated Vital Signs: BP (!) 96/50   Pulse 86   Temp (!) 101.8 F (38.8 C)   Resp (!) 24   SpO2 99%    General: Somnolent, barely arousable to painful stimuli.  Severe distress. CV:  Tachycardic.  Good peripheral perfusion.  Resp:  Increased effort.  BVM. Abd:  Nontender.  No distention.  Other:  Feels hot to the touch.   ED Results / Procedures / Treatments  Labs (all labs ordered are listed, but only abnormal results are displayed) Labs Reviewed  RESP  PANEL BY RT-PCR (RSV, FLU A&B, COVID)  RVPGX2  CULTURE, BLOOD (ROUTINE X 2)  CULTURE, BLOOD (ROUTINE X 2)  URINE CULTURE  LACTIC ACID, PLASMA  LACTIC ACID, PLASMA  COMPREHENSIVE METABOLIC PANEL  CBC WITH DIFFERENTIAL/PLATELET  PROTIME-INR  APTT  URINALYSIS, COMPLETE (UACMP) WITH MICROSCOPIC  BRAIN NATRIURETIC PEPTIDE  BLOOD GAS, ARTERIAL  TROPONIN I (HIGH SENSITIVITY)     EKG  ED ECG REPORT I, Loucinda Croy J, the attending physician, personally viewed and interpreted this ECG.   Date: 08/07/2022  EKG Time: 0626  Rate: 93  Rhythm: normal sinus rhythm  Axis: Normal  Intervals:none  ST&T Change: Nonspecific    RADIOLOGY I have independently visualized and interpreted patient's chest x-ray:  X-ray: Multifocal pneumonia  Official radiology report(s): No results found.   PROCEDURES:  Critical Care performed: Yes, see critical care procedure note(s)  CRITICAL CARE Performed by: Irean Hong   Total critical care time: 30 minutes  Critical care time was exclusive of separately billable procedures and treating other patients.  Critical care was necessary to treat or prevent imminent or life-threatening deterioration.  Critical care was time spent personally by me on the following activities: development of treatment plan with patient and/or surrogate as well as nursing, discussions with consultants, evaluation of patient's response to treatment, examination of patient, obtaining history from patient or surrogate, ordering and performing treatments and interventions, ordering and review of laboratory studies, ordering and review of radiographic studies, pulse oximetry and re-evaluation of patient's condition.   [name]1-3 Lead EKG Interpretation  Performed by: Irean Hong,  MD Authorized by: Irean Hong, MD     Interpretation: abnormal     ECG rate:  105   ECG rate assessment: tachycardic     Rhythm: sinus tachycardia     Ectopy: none     Conduction: normal    Comments:     Placed on cardiac monitor to evaluate for arrhythmias Procedure Name: Intubation Date/Time: 08/07/2022 6:31 AM  Performed by: Irean Hong, MDPre-anesthesia Checklist: Patient identified, Patient being monitored, Emergency Drugs available, Timeout performed and Suction available Oxygen Delivery Method: Non-rebreather mask Preoxygenation: Pre-oxygenation with 100% oxygen Induction Type: Rapid sequence Ventilation: Mask ventilation without difficulty Laryngoscope Size: Mac, Glidescope and 3 Tube size: 7.5 mm Number of attempts: 1 Airway Equipment and Method: Rigid stylet Placement Confirmation: ETT inserted through vocal cords under direct vision, CO2 detector and Breath sounds checked- equal and bilateral       MEDICATIONS ORDERED IN ED: Medications  metroNIDAZOLE (FLAGYL) IVPB 500 mg (has no administration in time range)  lactated ringers bolus 1,000 mL (has no administration in time range)    And  lactated ringers bolus 1,000 mL (1,000 mLs Intravenous New Bag/Given 08/07/22 0629)  norepinephrine (LEVOPHED) 4mg  in (0.016 mg/mL) premix infusion (15 mcg/min Intravenous New Bag/Given 08/07/22 0633)  propofol (DIPRIVAN) 1000 MG/100ML infusion (10 mcg/kg/min  62.1 kg Intravenous New Bag/Given 08/07/22 0634)  cefTRIAXone (ROCEPHIN) 1 g in sodium chloride 0.9 % 100 mL IVPB (has no administration in time range)  vancomycin (VANCOREADY) IVPB 1500 mg/300 mL (has no administration in time range)  acetaminophen (TYLENOL) suppository 975 mg (has no administration in time range)  etomidate (AMIDATE) injection 20 mg (20 mg Intravenous Given 08/07/22 0619)  succinylcholine (ANECTINE) syringe 150 mg (150 mg Intravenous Given 08/07/22 0619)  methylPREDNISolone sodium succinate (SOLU-MEDROL) 125 mg/2 mL injection 125 mg (125 mg Intravenous Given 08/07/22 0643)     IMPRESSION / MDM / ASSESSMENT AND PLAN / ED COURSE  I reviewed the triage vital signs and the nursing notes.                              82 year old female who presents with acute respiratory failure with hypoxia. Differential includes, but is not limited to, viral syndrome, bronchitis including COPD exacerbation, pneumonia, reactive airway disease including asthma, CHF including exacerbation with or without pulmonary/interstitial edema, pneumothorax, ACS, thoracic trauma, and pulmonary embolism.  Personally reviewed patient's records and note a recent ED visit on 08/01/2022 for evaluation status post tooth extraction.  Patient's presentation is most consistent with acute presentation with potential threat to life or bodily function.  The patient is on the cardiac monitor to evaluate for evidence of arrhythmia and/or significant heart rate changes.  Patient intubated immediately upon her arrival for airway protection.  ED code sepsis protocol initiated.  Patient will receive 30 cc/kilo IV fluids and empiric broad-spectrum IV antibiotics.  Obtain lab work, chest x-ray.  Clinical Course as of 08/07/22 1610  Wynelle Link Aug 07, 2022  [id number] Discussed case with CCU intensivist O[place] who will evaluate patient for admission. [JS]  [id number] Daughter has arrived.  I have updated her of patient's very serious medical condition and grim prognosis.  CCU intensivist whom is currently at bedside evaluating patient. [JS]    Clinical Course User Index [JS] Irean Hong, MD     FINAL CLINICAL IMPRESSION(S) / ED DIAGNOSES   Final diagnoses:  Acute respiratory failure with hypoxia (HCC)  Sepsis, due to  unspecified organism, unspecified whether acute organ dysfunction present (HCC)  Hypotension, unspecified hypotension type  Fever, unspecified fever cause  Multifocal pneumonia     Rx / DC Orders   ED Discharge Orders     None        Note:  This document was prepared using Dragon voice recognition software and may include unintentional dictation errors.   Irean Hong, MD 08/07/22 [phone]
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
## 2022-08-07 NOTE — Progress Notes (Signed)
Patients CBG reading is 397, Dr. Karna Christmas notified and ordered sliding scale insulin.

## 2022-08-07 NOTE — Progress Notes (Signed)
An USGPIV (ultrasound guided PIV) has been placed for short-term vasopressor infusion. A correctly placed ivWatch must be used when administering Vasopressors. Should this treatment be needed beyond 72 hours, central line access should be obtained.  It will be the responsibility of the bedside nurse to follow best practice to prevent extravasations.   

## 2022-08-07 NOTE — Consult Note (Signed)
Consultation Note Date: 08/07/2022   Patient Name: Kristi [name]  DOB: December 29, 1940  MRN: [id number]  Age / Sex: 82 y.o., female  PCP: System, Provider Not In Referring Physician: Vida Rigger, MD  Reason for Consultation: Establishing goals of care   HPI/Brief Hospital Course: 82 y.o. 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  Found to be hypoxic and hypotensive on arrival to ED Required intubation in ED and initiation of vasopressors  Admitted to ICU, on mechanical ventilation (100% FiO2) and continues to require vasopressor support  Noted ED visit 08/01/2022 for bleeding s/p dental extraction 1 week prior to ED visit  Palliative medicine was consulted for assisting with goals of care conversations  Subjective:  Extensive chart review has been completed prior to meeting patient including labs, vital signs, imaging, progress notes, orders, and available advanced directive documents from current and previous encounters.  Visited with Mr. Aloi at her bedside. Remains intubated and sedated. Met with family in conference room.  Introduced myself as a Publishing rights manager as a member of the palliative care team. Explained palliative medicine is specialized medical care for people living with serious illness. It focuses on providing relief from the symptoms and stress of a serious illness. The goal is to improve quality of life for both the patient and the family.   Family present included daughter-Teresa, her husband and daughter, son-Bobby and his wife. Daughter-Teresa documented HCPOA, will have paper work brought in from home to be scanned into EMR.  Family shares  a brief life review. Kristi [name] has been a LTC resident of [facility] for about 3 years. She was having difficulty caring for herself independently and having frequent falls which prompted her to become a resident of  [facility]. She is not currently married; Rosey Bath and Reita Cliche are her only living children. 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 Rosey Bath shares she and the family are aware that Kristi [name] has suffered from chronic pain related to her history of cancer for many years and over the last several months have noticed a decline.  Family shares they feel this acute event started after a dental extraction. Recurrent bleeding was an ongoing issue after extraction. Also noticed decline in mentation around the same time as well as increase in falls.  Rosey Bath and her sister-in-law able to share their understanding of current medical condition and most recent updates as just shared by Dr. Wynema Birch attending. They share their understanding that Kristi [name] remains in critical condition, on maximum ventilator support and requiring medications to support her blood pressure. Share their understanding that due to multiple chronic underlying conditions as well as advanced age, Kristi [name]'s prognosis is guarded.  Confirmed DNR status as discussed with Dr. Karna Christmas. Family also shares they do not wish to cause further suffering.  We discussed comfort care measures. Shifting of focus on providing comfort and liberating [name] from aggressive medical interventions such as mechanical ventilation. She would no longer receive aggressive medical interventions such as continuous vital signs, lab work, radiology testing, or medications not focused on comfort. All care would focus on how the patient is looking and feeling. This would include management of any symptoms that may cause discomfort, pain, shortness of breath, cough, nausea, agitation,  anxiety, and/or secretions etc. Symptoms would be managed with  medications and other non-pharmacological interventions such as spiritual support if requested, repositioning, music therapy, or therapeutic listening. Family verbalized understanding and appreciation.   At this time, family wishes to provide Kristi [name] with 24-48 hours of time for outcomes. At that time, they would readdress GOC, if condition remains critical and prognosis remains guarded they will consider transition to comfort measures at that time. We discussed high Fi02 requirements and addition of vasopressor support in relation to critical status-in the event of continued decline/decompensation family shares they are also open to discussing transition to comfort care at that time to avoid further suffering.  Emotional support provided to family. Shared member from PMT services will be available throughout week to offer support and guidance.  I discussed importance of continued conversations with family/support persons and all members of their medical team regarding overall plan of care and treatment options ensuring decisions are in alignment with patients goals of care.  All questions/concerns addressed. Emotional support provided to patient/family/support persons. PMT will continue to follow and support patient as needed.  Objective: Primary Diagnoses: Present on Admission:  Acute on chronic respiratory failure with hypoxia and hypercapnia (HCC)  Sepsis due to pneumonia [facility])   Physical Exam  Vital Signs: BP (!) 139/56   Pulse 97   Temp (!) 100.9 F (38.3 C) (Bladder)   Resp 20   Wt 66.7 kg   SpO2 94%   BMI 23.03 kg/m  Pain Scale: Faces     IO: Intake/output summary:  Intake/Output Summary (Last 24 hours) at 08/07/2022 1037 Last data filed at 08/07/2022 4098 Gross per 24 hour  Intake 100 ml  Output --  Net 100 ml    LBM:   Baseline Weight: Weight: 66.7 kg Most recent weight: Weight: 66.7 kg       Assessment and Plan  SUMMARY OF RECOMMENDATIONS   DNR as  discussed with CCM team-confirmed with family Allow time for outcomes (24-48 hours) Open to further discussions of transitioning to comfort care if status continues to decline PMT to follow for ongoing needs and support  Discussed With: CCM team and nursing staff   Thank you for this consult and allowing Palliative Medicine to participate in the care of [facility]. Palliative medicine will continue to follow and assist as needed.   Time Total: 55 minutes  Time spent includes: Detailed review of medical records (labs, imaging, vital signs), medically appropriate exam (mental status, respiratory, cardiac, skin), discussed with treatment team, counseling and educating patient, family and staff, documenting clinical information, medication management and coordination of care.   Signed by: Leeanne Deed, DNP, AGNP-C Palliative Medicine    Please contact Palliative Medicine Team phone at [phone] for questions and concerns.  For individual provider: See Loretha Stapler

## 2022-08-07 NOTE — Progress Notes (Signed)
PHARMACY -  BRIEF ANTIBIOTIC NOTE   Pharmacy has received consult(s) for Vancomycin from an ED provider.  The patient's profile has been reviewed for ht/wt/allergies/indication/available labs.    One time order(s) placed for 1500 mg per pt wt: 62.1 kg.  Further antibiotics/pharmacy consults should be ordered by admitting physician if indicated.                       Thank you, Otelia Sergeant, PharmD, [facility] 08/07/2022 6:37 AM

## 2022-08-07 NOTE — Procedures (Signed)
Central Venous Catheter Placement:  TRIPLE LUMEN     Procedure: Insertion of Non-tunneled Central Venous Catheter(36556) with US guidance ([address])    Indication(s) Medication administration and Difficult access  CVP monitoring Patient receiving vesicant or irritant drug.; Patient receiving intravenous therapy for longer than 5 days.; Patient has limited or no vascular access.      Consent Risks of the procedure as well as the alternatives and risks of each were explained to the patient and/or caregiver.  Consent for the procedure was obtained and is signed in the bedside chart   Consent-verbal/written         Timeout Verified patient identification, verified procedure, site/side was marked, verified correct patient position, special equipment/implants available, medications/allergies/relevant history reviewed, required imaging and test results available. Patient comfort was obtained.     Sterile Technique Maximal sterile technique including full sterile barrier drape, hand hygiene, sterile gown, sterile gloves, mask, hair covering, sterile ultrasound probe cover (if used).   Hand washing performed prior to starting the procedure.      Procedure Description Area of catheter insertion was cleaned with chlorhexidine and draped in sterile fashion.  With real-time ultrasound guidance a central venous catheter was placed into the right internal jugular vein. Nonpulsatile blood flow and easy flushing noted in all ports.  The catheter was sutured in place and sterile dressing applied.   A triple lumen catheter was placed in right jugular Vein There was good blood return, catheter caps were placed on lumens, catheter flushed easily, the line was secured and a sterile dressing and BIO-PATCH applied.      Complications/Tolerance None; patient tolerated the procedure well. Chest X-ray is ordered to verify placement     EBL Minimal   Specimen(s) None     Number of Attempts:  1 Complications:none Estimated Blood Loss: none    Vida Rigger, M.D.  Pulmonary & Critical Care Medicine  [facility]

## 2022-08-07 NOTE — Sepsis Progress Note (Signed)
Elink following for sepsis protocol. 

## 2022-08-07 NOTE — Plan of Care (Signed)
Continuing with plan of care. 

## 2022-08-07 NOTE — Progress Notes (Signed)
Stat chest x-ray order placed for verification of CVC placement as ordered by Dr. Karna Christmas.

## 2022-08-07 NOTE — ED Notes (Signed)
Attempted to give ICU report, the nurse oncoming was unable to take. Reported to ER nurse

## 2022-08-07 NOTE — Progress Notes (Signed)
Central line being placed by Dr. Karna Christmas at bedside.  Consent obtained before procedure, time out performed, and sterile technique followed.  Patient administered bolus fentanyl dose before initiation of procedure.

## 2022-08-08 DIAGNOSIS — J9621 Acute and chronic respiratory failure with hypoxia: Secondary | ICD-10-CM

## 2022-08-08 DIAGNOSIS — J9622 Acute and chronic respiratory failure with hypercapnia: Secondary | ICD-10-CM | POA: Diagnosis not present

## 2022-08-08 LAB — C-REACTIVE PROTEIN: CRP: 69.6 mg/dL — ABNORMAL HIGH (ref <1.0)

## 2022-08-08 LAB — HEMOGLOBIN A1C
Hgb A1c MFr Bld: 6.8 % — ABNORMAL HIGH (ref 4.8–5.6)
Mean Plasma Glucose: 148.46 mg/dL

## 2022-08-08 LAB — BASIC METABOLIC PANEL
Anion gap: 9 (ref 5–15)
BUN: 49 mg/dL — ABNORMAL HIGH (ref 8–23)
CO2: 27 mmol/L (ref 22–32)
Calcium: 7.8 mg/dL — ABNORMAL LOW (ref 8.9–10.3)
Chloride: 96 mmol/L — ABNORMAL LOW (ref 98–111)
Creatinine, Ser: 2.03 mg/dL — ABNORMAL HIGH (ref 0.44–1.00)
GFR, Estimated: 24 mL/min — ABNORMAL LOW (ref >60)
Glucose, Bld: 155 mg/dL — ABNORMAL HIGH (ref 70–99)
Potassium: 4.7 mmol/L (ref 3.5–5.1)
Sodium: 132 mmol/L — ABNORMAL LOW (ref 135–145)

## 2022-08-08 LAB — CBC
HCT: 30.9 % — ABNORMAL LOW (ref 36.0–46.0)
Hemoglobin: 9.4 g/dL — ABNORMAL LOW (ref 12.0–15.0)
MCH: 30.2 pg (ref 26.0–34.0)
MCHC: 30.4 g/dL (ref 30.0–36.0)
MCV: 99.4 fL (ref 80.0–100.0)
Platelets: 110 10*3/uL — ABNORMAL LOW (ref 150–400)
RBC: 3.11 MIL/uL — ABNORMAL LOW (ref 3.87–5.11)
RDW: 15.1 % (ref 11.5–15.5)
WBC: 3.5 10*3/uL — ABNORMAL LOW (ref 4.0–10.5)
nRBC: 2.3 % — ABNORMAL HIGH (ref 0.0–0.2)

## 2022-08-08 LAB — PROCALCITONIN: Procalcitonin: >150 ng/mL

## 2022-08-08 LAB — URINE CULTURE: Culture: NO GROWTH

## 2022-08-08 LAB — PHOSPHORUS: Phosphorus: 7.1 mg/dL — ABNORMAL HIGH (ref 2.5–4.6)

## 2022-08-08 LAB — GLUCOSE, CAPILLARY
Glucose-Capillary: 140 mg/dL — ABNORMAL HIGH (ref 70–99)
Glucose-Capillary: 77 mg/dL (ref 70–99)

## 2022-08-08 LAB — TRIGLYCERIDES: Triglycerides: 109 mg/dL (ref <150)

## 2022-08-08 LAB — CULTURE, BLOOD (ROUTINE X 2)

## 2022-08-08 LAB — MAGNESIUM: Magnesium: 2 mg/dL (ref 1.7–2.4)

## 2022-08-08 MED ORDER — ACETAMINOPHEN 650 MG RE SUPP: 650.0000 mg | Freq: Four times a day (QID) | RECTAL | Status: DC | PRN

## 2022-08-08 MED ORDER — SODIUM CHLORIDE 0.9 % IV SOLN: INTRAVENOUS | Status: DC

## 2022-08-08 MED ORDER — GLYCOPYRROLATE 0.2 MG/ML IJ SOLN: 0.2000 mg | INTRAMUSCULAR | Status: DC | PRN

## 2022-08-08 MED ORDER — LORAZEPAM 2 MG/ML IJ SOLN: 2.0000 mg | INTRAMUSCULAR | Status: DC | PRN

## 2022-08-08 MED ORDER — POLYVINYL ALCOHOL 1.4 % OP SOLN: 1.0000 [drp] | Freq: Four times a day (QID) | OPHTHALMIC | Status: DC | PRN

## 2022-08-08 MED ORDER — MORPHINE 100MG IN NS 100ML (1MG/ML) PREMIX INFUSION
1.0000 mg/h | INTRAVENOUS | Status: DC
  Administered 2022-08-08: 2 mg/h via INTRAVENOUS
  Filled 2022-08-08: qty 100

## 2022-08-09 LAB — CULTURE, RESPIRATORY W GRAM STAIN

## 2022-08-09 LAB — CULTURE, BLOOD (ROUTINE X 2)
Culture: NO GROWTH
Special Requests: ADEQUATE

## 2022-08-09 LAB — LEGIONELLA PNEUMOPHILA SEROGP 1 UR AG: L. pneumophila Serogp 1 Ur Ag: NEGATIVE

## 2022-08-10 LAB — CULTURE, RESPIRATORY W GRAM STAIN: Gram Stain: NONE SEEN

## 2022-08-10 LAB — CULTURE, BLOOD (ROUTINE X 2)

## 2022-08-11 LAB — CULTURE, BLOOD (ROUTINE X 2)

## 2022-08-12 LAB — CULTURE, BLOOD (ROUTINE X 2): Culture: NO GROWTH

## 2022-08-20 NOTE — Progress Notes (Signed)
Patient pronounced at 9:49 by Dyke Brackett and Daisy Lazar. Patient has no palpable pulses, no lung or heart sounds auscultated. Patients family at bedside. Annabelle Harman NP, House supervisor notified. Per honor bridge patient can be released to funeral home.
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
## 2022-08-20 NOTE — IPAL (Signed)
Interdisciplinary Goals of Care Family Meeting     Date carried out:08/07/22 Location of the meeting: Bedside   Member's involved: NP and Family Member or next of kin   Durable Power of Attorney or acting medical decision maker: Daughter Margo Common   Discussion:  Advance Care Planning/Goals of Care discussion was performed during the course of treatment to decide on type of care right for this patient following admission to the ICU.   I met with patient's daughter (POA) at the bedside to discuss goals of care in details following  change in patient's current status. Reviewed patient's worsening lab, ABGs, vital signs including unstable HR and blood pressure requiring multiple pressors  and overall poor prognosis with the family at the bedside and answered all their question.   Discussed prognosis, expected outcome with or without ongoing aggressive treatments and the options for de-escalation of care.   Diagnosis(es): Acute hypoxic hypercapnic respiratory failure secondary multilobar pneumonia, sepsis with shock, multiorgan failure Prognosis: Poor Code Status: DNR Disposition: ICU Next Steps:  Family understands the situation. They have consented and agreed to DNR/DNI and would not wish to pursue any aggressive treatment.  Patient's family would like to proceed with full comfort care including terminal extubation in the morning   Family are satisfied with Plan of action and management. All questions answered     Total Time Spent Face to Face addressing advance care planning in the presence of the Patient: 35 minutes     Webb Silversmith, DNP, CCRN, FNP-C, AGACNP-BC Acute Care & Family Nurse Practitioner  [facility]  See Amion for personal pager PCCM on call pager [phone] until 7 am

## 2022-08-20 NOTE — Progress Notes (Signed)
  Chaplain On-Call received a page from ICU Secretary Cordelia Pen regarding the patient whose family requested a Chaplain to be present.  Chaplain received medical update from RN Dois Davenport, and met five family members at the bedside: Two daughters Son Granddaughter Son-in-law   Chaplain provided spiritual and emotional support and prayer, as the family is awaiting extubation of the patient for comfort care.  Chaplain Evelena Peat M.Div., [facility]

## 2022-08-20 NOTE — Progress Notes (Signed)
Levophed is infusing at maximum ordered dose, patient continues to be hypotensive. Lanora Manis, NP notified and no new orders entered. NP in to talk with patient's daughter present at the bedside. Daughter verbalized to me that they would be transitioning to comfort care in the morning. All questions answered for daughter at that time.

## 2022-08-20 NOTE — Progress Notes (Signed)
Nutrition Brief Note  Chart reviewed. Pt now transitioning to comfort care. Plan for terminal extubation today. No further nutrition interventions planned at this time.  Please re-consult as needed.   Levada Schilling, RD, LDN, CDCES Registered Dietitian II Certified Diabetes Care and Education Specialist Please refer to [facility] for RD and/or RD on-call/weekend/after hours pager
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
## 2022-08-20 NOTE — Progress Notes (Signed)
Pt extubated to comfort care, no stridor noted.

## 2022-08-20 NOTE — Progress Notes (Signed)
   08/07/2022 1000  Spiritual Encounters  Type of Visit Declined chaplain visit  Reason for visit Patient death  OnCall Visit No  Spiritual Framework  Family Stress Factors Loss  Intervention Outcomes  Outcomes Declined chaplain visit  Spiritual Care Plan  Spiritual Care Issues Still Outstanding No further spiritual care needs at this time (see row info)   Patient pass away family declined visit. [place] Spaniel they already spoke with a Chaplain earlier.

## 2022-08-20 DEATH — deceased

## 2022-10-13 ENCOUNTER — Other Ambulatory Visit: Payer: Medicare Other

## 2022-10-13 ENCOUNTER — Ambulatory Visit: Payer: Medicare Other | Admitting: Oncology
